# Patient Record
Sex: Male | Born: 1955 | Race: White | Hispanic: No | Smoking: Former smoker
Health system: Southern US, Community
[De-identification: ages and names within clinical notes are randomized; demographics above are authoritative.]

## PROBLEM LIST (undated history)

## (undated) DIAGNOSIS — G4730 Sleep apnea, unspecified: Secondary | ICD-10-CM

## (undated) DIAGNOSIS — Z973 Presence of spectacles and contact lenses: Secondary | ICD-10-CM

## (undated) DIAGNOSIS — I10 Essential (primary) hypertension: Secondary | ICD-10-CM

## (undated) DIAGNOSIS — E785 Hyperlipidemia, unspecified: Secondary | ICD-10-CM

## (undated) DIAGNOSIS — E119 Type 2 diabetes mellitus without complications: Secondary | ICD-10-CM

## (undated) DIAGNOSIS — Z972 Presence of dental prosthetic device (complete) (partial): Secondary | ICD-10-CM

## (undated) DIAGNOSIS — I272 Pulmonary hypertension, unspecified: Secondary | ICD-10-CM

## (undated) DIAGNOSIS — I219 Acute myocardial infarction, unspecified: Secondary | ICD-10-CM

## (undated) DIAGNOSIS — G4700 Insomnia, unspecified: Secondary | ICD-10-CM

## (undated) DIAGNOSIS — E1121 Type 2 diabetes mellitus with diabetic nephropathy: Secondary | ICD-10-CM

## (undated) DIAGNOSIS — M1990 Unspecified osteoarthritis, unspecified site: Secondary | ICD-10-CM

## (undated) DIAGNOSIS — I2510 Atherosclerotic heart disease of native coronary artery without angina pectoris: Secondary | ICD-10-CM

## (undated) DIAGNOSIS — Z9289 Personal history of other medical treatment: Secondary | ICD-10-CM

## (undated) HISTORY — PX: KNEE ARTHROSCOPY: SHX127

## (undated) HISTORY — DX: Atherosclerotic heart disease of native coronary artery without angina pectoris: I25.10

## (undated) HISTORY — PX: COLONOSCOPY: SHX174

## (undated) HISTORY — DX: Essential (primary) hypertension: I10

## (undated) HISTORY — DX: Hyperlipidemia, unspecified: E78.5

## (undated) HISTORY — PX: TONSILLECTOMY: SUR1361

## (undated) HISTORY — DX: Personal history of other medical treatment: Z92.89

## (undated) HISTORY — PX: SHOULDER ARTHROSCOPY: SHX128

## (undated) HISTORY — DX: Insomnia, unspecified: G47.00

## (undated) HISTORY — DX: Acute myocardial infarction, unspecified: I21.9

## (undated) HISTORY — DX: Type 2 diabetes mellitus with diabetic nephropathy: E11.21

---

## 2000-09-13 HISTORY — PX: TOTAL KNEE ARTHROPLASTY: SHX125

## 2000-12-08 ENCOUNTER — Encounter: Payer: Self-pay | Admitting: Orthopedic Surgery

## 2000-12-12 ENCOUNTER — Inpatient Hospital Stay (HOSPITAL_COMMUNITY): Admission: RE | Admit: 2000-12-12 | Discharge: 2000-12-16 | Payer: Self-pay | Admitting: Orthopedic Surgery

## 2009-10-23 ENCOUNTER — Encounter: Payer: Self-pay | Admitting: Cardiology

## 2010-05-11 ENCOUNTER — Encounter: Payer: Self-pay | Admitting: Internal Medicine

## 2010-05-13 ENCOUNTER — Ambulatory Visit: Payer: Self-pay | Admitting: Internal Medicine

## 2010-05-13 ENCOUNTER — Ambulatory Visit (HOSPITAL_COMMUNITY): Admission: RE | Admit: 2010-05-13 | Discharge: 2010-05-13 | Payer: Self-pay | Admitting: Internal Medicine

## 2010-05-13 HISTORY — PX: COLONOSCOPY: SHX174

## 2010-05-19 ENCOUNTER — Encounter: Payer: Self-pay | Admitting: Internal Medicine

## 2010-09-13 HISTORY — PX: SHOULDER ARTHROSCOPY: SHX128

## 2010-10-13 NOTE — Letter (Signed)
Summary: Patient Notice, Colon Biopsy Results  [facility]  [address]   [place], [place] [address]   Phone: [phone]  Fax: [phone]       May 19, 2010   [name] Foster [address] [place], [place]  [address] 12-19-1955    Dear Ms. Edler,  I am pleased to inform you that the biopsies taken during your recent colonoscopy did not show any evidence of cancer upon pathologic examination.  Additional information/recommendations:  You should have a repeat colonoscopy examination  in 7 years.  Please call us if you are having persistent problems or have questions about your condition that have not been fully answered at this time.  Sincerely,    R. Roetta Sessions MD, FACP [facility] Ph: [phone]    Fax: [phone]   Appended Document: Patient Notice, Colon Biopsy Results Letter mailed to pt.

## 2010-10-13 NOTE — Letter (Signed)
Summary: [facility] orders  t[facility] orders   Imported By: Rosine Beat 05/11/2010 08:24:27  _____________________________________________________________________  External Attachment:    Type:   Image     Comment:   External Document

## 2010-11-02 ENCOUNTER — Encounter: Payer: Self-pay | Admitting: Cardiology

## 2010-11-10 ENCOUNTER — Encounter: Payer: Self-pay | Admitting: Cardiology

## 2010-11-16 ENCOUNTER — Encounter: Payer: Self-pay | Admitting: *Deleted

## 2010-11-24 NOTE — Letter (Signed)
Summary: External Correspondence/  [facility]  External Correspondence/  [facility]   Imported By: Dorise Hiss 11/16/2010 14:44:47  _____________________________________________________________________  External Attachment:    Type:   Image     Comment:   External Document

## 2010-11-24 NOTE — Medication Information (Signed)
Summary: RX Folder/ [facility]  RX Folder/ [facility]   Imported By: Dorise Hiss 11/16/2010 14:41:18  _____________________________________________________________________  External Attachment:    Type:   Image     Comment:   External Document

## 2010-11-24 NOTE — Letter (Signed)
Summary: External Correspondence/  [facility]  External Correspondence/  [facility]   Imported By: Dorise Hiss 11/16/2010 14:43:37  _____________________________________________________________________  External Attachment:    Type:   Image     Comment:   External Document

## 2010-12-01 ENCOUNTER — Other Ambulatory Visit: Payer: Self-pay | Admitting: Cardiology

## 2010-12-01 ENCOUNTER — Encounter: Payer: Self-pay | Admitting: *Deleted

## 2010-12-01 ENCOUNTER — Encounter: Payer: Self-pay | Admitting: Cardiology

## 2010-12-01 ENCOUNTER — Ambulatory Visit (INDEPENDENT_AMBULATORY_CARE_PROVIDER_SITE_OTHER): Payer: Private Health Insurance - Indemnity | Admitting: Cardiology

## 2010-12-01 VITALS — BP 153/91 | HR 69 | Ht 68.0 in | Wt 194.0 lb

## 2010-12-01 DIAGNOSIS — R072 Precordial pain: Secondary | ICD-10-CM

## 2010-12-01 DIAGNOSIS — E782 Mixed hyperlipidemia: Secondary | ICD-10-CM | POA: Insufficient documentation

## 2010-12-01 DIAGNOSIS — R0602 Shortness of breath: Secondary | ICD-10-CM

## 2010-12-01 DIAGNOSIS — I152 Hypertension secondary to endocrine disorders: Secondary | ICD-10-CM | POA: Insufficient documentation

## 2010-12-01 DIAGNOSIS — E1169 Type 2 diabetes mellitus with other specified complication: Secondary | ICD-10-CM | POA: Insufficient documentation

## 2010-12-01 DIAGNOSIS — R9431 Abnormal electrocardiogram [ECG] [EKG]: Secondary | ICD-10-CM | POA: Insufficient documentation

## 2010-12-01 DIAGNOSIS — E785 Hyperlipidemia, unspecified: Secondary | ICD-10-CM | POA: Insufficient documentation

## 2010-12-01 DIAGNOSIS — E1159 Type 2 diabetes mellitus with other circulatory complications: Secondary | ICD-10-CM | POA: Insufficient documentation

## 2010-12-01 DIAGNOSIS — I10 Essential (primary) hypertension: Secondary | ICD-10-CM

## 2010-12-01 DIAGNOSIS — G4730 Sleep apnea, unspecified: Secondary | ICD-10-CM | POA: Insufficient documentation

## 2010-12-01 MED ORDER — CHLORTHALIDONE 25 MG PO TABS: 25.0000 mg | ORAL_TABLET | Freq: Every day | ORAL | Status: DC

## 2010-12-01 NOTE — Assessment & Plan Note (Signed)
the patient has hypertriglyceridemia and acid to cut back on his carbohydrate intake

## 2010-12-01 NOTE — Assessment & Plan Note (Signed)
Blood pressure remains poorly controlled.  Will add chlorthalidone 25 mg p.o. Daily.  Further follow-up can be part of patient's primary care physician

## 2010-12-01 NOTE — Assessment & Plan Note (Signed)
The patient has multiple risk factors for coronary artery disease.  His dyspnea on exertion is concerning.  As an abnormal electrocardiogram with T-wave inversions.  He is poorly controlled hypertension.  He will be referred for an exercise Cardiolite stress study.This can be done on medications because the patient is not taking beta-blockers or calcium blockers.

## 2010-12-01 NOTE — Progress Notes (Signed)
HPI The patient is referred from [facility] for new patient evaluation. He has been treated for hypertension.  He currently takes rather brittle and amlodipine.  He has had difficulty sleeping.  According to the patient why he has loud snoring and sometimes stops breathing.  He is in the process of being evaluated for a sleep study.  Based on his history could have obstructive sleep apnea. The patient has dyslipidemia and HDL of 35 and triglycerides of 249.  LDL cholesterol is 97. The patient has been referred because of an abnormal electrocardiogram.  He has some inverted T waves in the lateral precordial leads.  He has multiple risk factors for coronary artery disease including hypertension and dyslipidemia.  His blood pressure remains poorly controlled.  The patient has noticed a decrease in exercise tolerance and increased shortness of breath on exertion.  Has some mild chest pressure on exertion.  He denies any definite chest pain.  He denies any claudication. The remainder of the review of systems is otherwise within normal limits.  The patient denies any palpitations, orthopnea or PND.  No Known Allergies  Current Outpatient Prescriptions on File Prior to Visit  Medication Sig Dispense Refill  . amLODipine (NORVASC) 5 MG tablet Take 1 tablet by mouth daily.       Marland Kitchen HYDROcodone-acetaminophen (VICODIN ES) 7.5-750 MG per tablet Take 1 tablet by mouth every 6 hours as needed pain.       . ramipril (ALTACE) 10 MG tablet Take 1 tablet by mouth daily.       Marland Kitchen zolpidem (AMBIEN) 10 MG tablet Take 1 tablet by mouth every night.       Marland Kitchen DISCONTD: cyclobenzaprine (FLEXERIL) 10 MG tablet Take 1 tablet by mouth four times per day.       Marland Kitchen DISCONTD: rOPINIRole (REQUIP) 1 MG tablet Take 1 mg by mouth 3 (three) times daily.        Marland Kitchen DISCONTD: rosuvastatin (CRESTOR) 5 MG tablet Take 1 tablet by mouth every night.          Past Medical History  Diagnosis Date  . Hypertension   . Insomnia   .  Hyperlipidemia     History reviewed. No pertinent past surgical history.  History reviewed. No pertinent family history.  History   Social History  . Marital Status: Single    Spouse Name: N/A    Number of Children: N/A  . Years of Education: N/A   Occupational History  . Not on file.   Social History Main Topics  . Smoking status: Current Everyday Smoker -- 0.5 packs/day  . Smokeless tobacco: Not on file  . Alcohol Use: Not on file  . Drug Use: Not on file  . Sexually Active: Not on file   Other Topics Concern  . Not on file   Social History Narrative  . No narrative on file   The patient denies fatigue, malaise, fever, weight gain/loss, vision loss, decreased hearing, hoarseness, chest pain, palpitations, shortness of breath, prolonged cough, wheezing, sleep apnea, coughing up blood, abdominal pain, blood in stool, nausea, vomiting, diarrhea, heartburn, incontinence, blood in urine, muscle weakness, joint pain, leg swelling, rash, skin lesions, headache, fainting, dizziness, depression, anxiety, enlarged lymph nodes, easy bruising or bleeding, and environmental allergies.    PHYSICAL EXAM BP 153/91  Pulse 69  Ht 5\' 8"  (1.727 m)  Wt 194 lb (87.998 kg)  BMI 29.50 kg/m2 General: Well-developed, well-nourished in no distress Head: Normocephalic and atraumatic Eyes:PERRLA/EOMI intact, conjunctiva  and lids normal Ears: No deformity or lesions Mouth:normal dentition, normal posterior pharynx Neck: Supple, no JVD.  No masses, thyromegaly or abnormal cervical nodes Lungs: Normal breath sounds bilaterally without wheezing.  Normal percussion Cardiac: regular rate and rhythm with normal S1 and S2, no S3 or S4.  PMI is normal.  No pathological murmurs Abdomen: Normal bowel sounds, abdomen is soft and nontender without masses, organomegaly or hernias noted.  No hepatosplenomegaly MSK: Back normal, normal gait muscle strength and tone normal Vascular: Pulse is normal in all 4  extremities Extremities: No peripheral pitting edema Neurologic: Alert and oriented x 3 Skin: Intact without lesions or rashes Lymphatics: No significant adenopathyPsychologic: Normal affect   EAV:[id number] sinus rhythm nonspecific T wave abnormalities but inverted T waves in the lateral leads.  Heart rate 66 beats/min  ASSESSMENT AND PLAN

## 2010-12-01 NOTE — Patient Instructions (Signed)
Begin Chlorthalidone 25mg  daily Exercise Cardiolite (stress test) - on meds Follow up in  6 months

## 2010-12-01 NOTE — Progress Notes (Signed)
Addended by: Hoover Brunette on: 12/01/2010 12:19 PM   Modules accepted: Orders

## 2010-12-02 ENCOUNTER — Other Ambulatory Visit: Payer: Self-pay | Admitting: Cardiology

## 2010-12-07 DIAGNOSIS — R072 Precordial pain: Secondary | ICD-10-CM

## 2011-01-14 ENCOUNTER — Encounter: Payer: Self-pay | Admitting: Cardiology

## 2011-01-29 NOTE — Op Note (Signed)
[place]  Patient:    [name] Foster, [name] Foster                    MRN: [id number] Proc. Date: 12/12/00 Attending:  Jonny Ruiz L. Dorothyann Gibbs, M.D.                           Operative Report  PREOPERATIVE DIAGNOSIS:  Medial compartment osteoarthritis, left knee.  POSTOPERATIVE DIAGNOSIS:  Medial compartment osteoarthritis, left knee.  OPERATION PERFORMED:  Left LCS total knee replacement.  SURGEON:  John L. Dorothyann Gibbs, M.D.  ASSISTANT:  Jamelle Rushing, P.A.  ANESTHESIA:  General.  INDICATIONS FOR PROCEDURE:  The patient has bone-on-bone medial compartment, left knee with relatively well-preserved patellofemoral and lateral joint.  DESCRIPTION OF PROCEDURE:  Under general anesthesia, the left knee was prepared with Betadine and draped as a sterile field.  A sterile proximal thigh tourniquet was used.  The leg was wrapped out with the Esmarch and tourniquet was used at 350 mmHg.  A midline incision was made.  The patella was everted.  Multiple small vessels were cauterized. The femur was sized as a standard plus.  The proximal tibial resection was carried out.  The PCL was not preserved.  Using the first femoral guide, an intercondylar drill hole was placed with a 10 mm flexion gap.  The gap was balanced by release medially from the tibia.  Following this, the intramedullary guide was used and the distal femoral cut was made with a 10 mm extension gap.  Recessing guide was then used.  A lamina spreader was then used and remnants of the cruciates, menisci and spurs off the back of the femoral condyle were resected.  Once this was completed, the center peg hole was placed in the tibia.  The tibia was sized at a size 4. Trial reduction at a size 4 tibia 10 mm rotating platform and a standard plus femur reveals excellent fit, alignment and stability.  The patella was then osteotomized and three peg holes were placed. At this point bony surfaces were  prepared with pulse irrigation.  Synovectomy was carried out.  Tibial component was then cemented in place.  Once it was hardened, the femoral and patellar components were press-fit with excellent fit, alignment and stability.  A tourniquet was then let down at 51 minutes. Multiple small vessels were cauterized.  The wound was then closed in layers with #1 Tycron, 0 and 2-0 Vicryl and skin clips.  The patient tolerated the procedure well and returned to recovery in good condition. DD:  12/12/00 TD:  12/12/00 Job: [id number]

## 2011-01-29 NOTE — Discharge Summary (Signed)
[place]. [facility]  Patient:    [name] Foster, [name] Foster                     MRN: [id number] Adm. Date:  [id number] Disc. Date: [id number] Attending:  Carolan Shiver Ii Dictator:   Jamelle Rushing, P.A.                           Discharge Summary  ADMISSION DIAGNOSES: 1. End-stage osteoarthritis left knee. 2. Tobacco use.  DISCHARGE DIAGNOSES: 1. Left total knee arthroplasty. 2. Tobacco use. 3. Hematoma left knee. 4. Oral thrush.  HISTORY OF PRESENT ILLNESS:  The patient is a 55 year old white male with a history of ACL tear at the age of 67 in his left knee.  The patient has had a 30-35 year history of left knee pain which has progressively worsened over the last 2 years.  An arthroscopic evaluation in 1987 had some short term improvement.  The patient now has constant sharp throbbing pain over the joint with radiation into the calf.  It worsens with kneeling and improves with rest.  He does have grinding, swelling and night pain.  He is currently having no relief with any medications.  ALLERGIES:  No known drug allergies.  CURRENT MEDICATIONS:  None.  SURGICAL PROCEDURE:  On December 12, 2000, the patient was taken to the OR by Dr. Jonny Ruiz L. Rendall, M.D., assisted by Jamelle Rushing, P.A.-C.  Under general anesthesia a left LCS total knee replacement was performed.  The patient tolerated the procedure well and was transferred to the recovery room in good condition.  CONSULTATIONS:  On December 12, 2000, the following routine consults were requested: 1. Physical therapy. 2. Occupational therapy. 3. Rehabilitation. 4. Care management. 5. Pharmacy for routine dosing of Coumadin for DVT prophylaxis.  HOSPITAL COURSE:  On December 12, 2000, the patient was admitted to [facility] under the care of John L. Rendall, M.D.  The patient underwent a left total knee arthroplasty without any complications.  He tolerated the procedure well and was transferred to  the recovery room in good condition.  The patient then incurred a 4-day postoperative course in which the patient had complaints of significant pain and effusion in his knee on postoperative day #2, so this was aspirated and got significant amount of bloody aspirate from his knee.  The patient had immediate relief of discomfort in his knee and only had a slight effusion on the following day but this was alleviated and improved with just a K pad at that time.  The patient also developed a significant sore throat.  On exam it was significantly erythemic.  He did have some white patches along the bilateral lateral borders.  This was swab sent to the lab.  It was found to have some yeast cells with hypha involved.  A rapid strep was negative so the patient was treated with dose of fluconazole and given Majic mouthwash.  The patient did have some improvements on the following day.  Otherwise the patient progressed nicely with physical therapy.   CPM did range up to 70 degrees on the date of discharge. The patient was able to ambulate to about 200 feet with just supervision.  The patients leg remained neuromotor vascularly intact.  A venous Doppler for posterior popliteal pain on postoperative day #3 was negative for any signs of DVT. This leg remained neuromotor vascularly intact.  He did develop  a significant ecchymoses about the entire knee but no obvious signs of any infection.  It was felt that the patient was ready to be discharged home on postoperative day #4 so arrangements were made for him to do so.  LABORATORY DATA:  Gram stain on December 15, 2000, of his throat showed few squamous epithelial cells, a few yeast with hypha elements present.  A few gram positive cocci in pairs and in chains and a few gram positive rods. CBC on December 15, 2000, WBC is 9.9, hemoglobin 12.1, hematocrit 34.7, platelets 178.  Coagulation studies on December 16, 2000, had an INR of 1.7.  Routine chemistries on  December 14, 2000, sodium 133, potassium 3.7, glucose 150, BUN 5, creatinine 0.9.  Routine urinalysis on admission was normal.  Preoperative chest x-ray showed mild interstitial opacities, nonspecific.  EKG:  Normal sinus rhythm at 68 beats per minute.  DISCHARGE MEDICATIONS: 1. Colace 100 mg p.o. b.i.d. 2. Nicotine patch 21 mg every 24 hours. 3. Celebrex 200 mg p.o. b.i.d. 4. Dulcolax 10 mg p.o. q.d. 5. Percocet 1 or 2 tablets every 4-6 hours p.r.n. pain. 6. Coumadin 7.5 mg p.o. q.d. 7. Cepacol throat lozenges p.r.n. 8. Majic mouthwash 5 mL p.o. p.r.n.  DISCHARGE INSTRUCTIONS:  MEDICATIONS:  The patient is to continue routine home medications. 1. Percocet 5 mg 1 or 2 tablets 4-6 hours for pain if needed. 2. Coumadin 5 mg tablets 1-1/2 tablets to equal 7.5 mg a day unless by changed by Genevieve Norlander Pharmacist.  ACTIVITY:  As tolerated with the use of a walker.  No driving.  DIET:  No restrictions.  WOUND CARE:  Keep wound clean and dry but may shower.  Check daily for infection, any increased redness, swelling, discharge, pain or temperature. Call physician if present.  SPECIAL INSTRUCTIONS:  Home health PT and home health RN by Genevieve Norlander.  First PT draw December 19, 2000.  FOLLOWUP:  Call [phone] for a follow up appointment with Dr. Priscille Kluver in 10 days.  DISCHARGE CONDITION:  Discharge to home in good condition. DD:  12/16/00 TD:  12/16/00 Job: [id number]

## 2011-01-29 NOTE — H&P (Signed)
[place]. [facility]  Patient:    Foster, [name]                 MRN: [id number] Adm. Date:  12/12/00 Attending:  Jonny Ruiz L. Dorothyann Foster, M.D. Dictator:   Arnoldo Morale, P.A.                         History and Physical  DATE OF BIRTH:  1955-11-22  CHIEF COMPLAINT:  Progressively worsening left knee pain for the last two years.  HISTORY OF PRESENT ILLNESS:  This 55 year old white male patient presents to Dr. Priscille Kluver with a history of an ACL rupture at age 35 due to an injury on a tobacco slide.  Since that time he has had a left knee scope approximately 15 years ago and has had intermittent left knee pain.  For the last year and a half to two years he has had progressively worsening knee pain.  At this point the pain is pretty much constant, described as either sharp, stabbing, or throbbing located in the middle of the knee or along the joint line.  It does radiate down to his calf at times.  There is a large amount of stiffness in the knee.  The pain increases with any kneeling activities and decreases with rest.  He has tried Celebrex and cortisone shots in the past with no relief of his pain.  He reports when he bears weight on his leg that it seems to shift laterally at times.  The knee also grinds and "ratchets."  It does swell and he does have a moderate amount of night pain.  He has tried different braces and sleeves over the years to help support the knee and for the last year and a half to two years wearing something like that is very painful.  He does not use any assistive devices.  ALLERGIES:  No known drug allergies.  CURRENT MEDICATIONS:  Multivitamin one tablet p.o. q.d.  PAST MEDICAL HISTORY:  He reports he had asthma as a child, but has been asymptomatic for many, many years.  PAST SURGICAL HISTORY: 1. Left knee arthroscopy approximately 1987. 2. Right knee arthroscopy approximately 1985. 3. Right knee arthroscopy 1986 all by  Dr. Priscille Kluver. 4. Right shoulder arthroscopy by Dr. Jonny Ruiz L. Rendall Jan 25, 2001. 5. Tonsillectomy as a child.  SOCIAL HISTORY:  The patient does currently smoke about a pack and a half of cigarettes a day.  He has a 50 pack year history of cigarette smoking.  He drinks alcohol about once a week.  Does not use any drugs.  He is married and has two children age 44 and 74.  He and his wife live in a one story house with four steps into the main entrance.  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  His medical doctor is Dr. Samuel Jester in [place], [place].  FAMILY HISTORY:  His mother is alive at age 1 with no major medical problems. His father died at age 56 with rheumatoid arthritis, end-stage renal disease on hemodialysis, and diabetes mellitus.  He has two brothers age 84 and 92 who are alive and healthy.  His children are age 89 and 56 without any medical problems.  REVIEW OF SYSTEMS:  He does have an occasional migraine with big spaces in between that are almost as long as a year.  He does have decreased hearing in the left ear.  He complains of year long sinus problems.  He does get dyspneic with ambulation up more than two flights of stairs.  He has dentures along the upper jaw line and does wear reading glasses.  He has otherwise in the right knee also.  He does not have a living will nor power of attorney.  All other systems are negative and noncontributory at this time.  PHYSICAL EXAMINATION  GENERAL:  Well-developed, well-nourished, mildly overweight white male in no acute distress.  Walks with no obvious limp.  Mood and affect are appropriate. He talks easily with examiner.  VITAL SIGNS:  Height 5 feet 8 inches, weight 205 pounds, BMI 30, temperature 98.6 degrees Fahrenheit, pulse 68, respirations 16, blood pressure 152/90.  HEENT:  Normocephalic, atraumatic without frontal or maxillary sinus tenderness to palpation.  Conjunctivae:  Pink.  Sclerae:   Anicteric.  PERRLA. EOMs intact.  Funduscopic examination shows visible red reflexes bilaterally with normal retinal vasculature, optic disk, and cup.  No visible external ear deformities.  Hearing grossly intact.  Tympanic membrane pearly grey bilaterally with good light reflex.  Nose and nasal septum midline.  Nasal mucosa pink and moist without exudates or polyps noted.  Buccal mucosa pink and moist.  Good dentition.  Pharynx without erythema or exudates.  Tongue and uvula midline.  Tongue without vesiculations.  Uvula rises equally with phonation.  NECK:  No visible masses or lesions noted.  Trachea midline.  No palpable lymphadenopathy nor thyromegaly.  Carotids +2 bilaterally without bruits. Full range of motion of the cervical spine and nontender to palpation along the cervical spine.  CARDIOVASCULAR:  Heart rate and rhythm regular.  S1, S2 present without rubs, clicks, or murmurs noted.  RESPIRATORY:  Respirations even and unlabored.  Breath sounds clear to auscultation bilaterally without rales or wheezes noted.  ABDOMEN:  Rounded abdominal contour.  Bowel sounds present x 4 quadrants. Soft, nontender to palpation without hepatosplenomegaly nor CVA tenderness. Femoral pulses +2 bilaterally.  Nontender to palpation along the entire length of the vertebral column.  BREASTS:  Deferred at this time.  GENITOURINARY:  Deferred at this time.  RECTAL:  Deferred at this time.  MUSCULOSKELETAL:  No obvious deformities bilateral upper extremities with full range of motion of these extremities without pain.  Radial pulses are +2 bilaterally.  Full range of motion of his hips, ankles, and toes bilaterally without pain.  DP and PT pulses are +2.  Right knee has no obvious deformity. No effusion.  No pain with palpation along the joint line.  Minimal crepitus with range of motion.  Has full extension with flexion to 130 degrees. Collateral ligaments are stable.  Left knee has no  obvious deformity.  Minimal crepitus with range of motion in the left knee.  He does have pain with  palpation along the lateral joint line of the left knee.  No effusion. Collateral ligaments are stable.  Full extension and flexion only to 115 degrees.  NEUROLOGIC:  Alert and oriented x 3.  Cranial nerves 2-12 are grossly intact. Strength 5/5 bilateral upper and lower extremities.  Rapid alternating movements intact.  Deep tendon reflexes 2+ bilateral upper and lower extremities.  Sensation intact to light touch.  Rapid alternating movements intact.  IMPRESSION: 1. End-stage osteoarthritis left knee with history of osteoarthritis right    knee. 2. Tobacco abuse.  PLAN:  [name] will be admitted to [facility] on December 12, 2000 where he will undergo a left total knee arthroplasty by Dr.  John L. Rendall. He will undergo all the routine preoperative laboratory tests and studies prior to this procedure.  He does wish to try to quit smoking while he is hospitalized so we will put him on a nicotine patch postoperatively. DD:  12/08/00 TD:  12/08/00 Job: [id number]

## 2011-12-04 ENCOUNTER — Other Ambulatory Visit: Payer: Self-pay | Admitting: Cardiology

## 2012-04-06 ENCOUNTER — Other Ambulatory Visit: Payer: Self-pay | Admitting: Cardiology

## 2012-06-06 ENCOUNTER — Other Ambulatory Visit: Payer: Self-pay | Admitting: Cardiology

## 2012-07-30 ENCOUNTER — Ambulatory Visit: Payer: Private Health Insurance - Indemnity | Attending: Neurology | Admitting: Sleep Medicine

## 2012-07-30 DIAGNOSIS — G4730 Sleep apnea, unspecified: Secondary | ICD-10-CM

## 2012-08-05 NOTE — Procedures (Signed)
[facility] Bradee Common A. Gerilyn Pilgrim, MD     [email or web address]        NAME:  [name] Foster, [name] Foster              ACCOUNT NO.:  [id number]  MEDICAL RECORD NO.:  [id number]          PATIENT TYPE:  OUT  LOCATION:  SLEEP LAB                     FACILITY:  [facility]  PHYSICIAN:  Adiel Erney A. Gerilyn Pilgrim, M.D. DATE OF BIRTH:  05-10-1956  DATE OF STUDY:  07/30/2012                           NOCTURNAL POLYSOMNOGRAM  REFERRING PHYSICIAN:  Samuel Jester, MD  INDICATION FOR STUDY:  A 56 year old, who presents with hypersomnia, fatigue, and snoring.  MEDICATIONS:  Ambien, Norvasc, chlorthalidone, glipizide, hydrocodone, Altace.  EPWORTH SLEEPINESS SCORE:  8.  BMI 29.  ARCHITECTURAL SUMMARY:  The total recording time is 360 minutes.  Sleep efficiency is 80%, sleep latency 32 minutes, REM latency 133 minutes. Stage N1 of 4%, N2 of 72%, N3 of 7%, and REM sleep 17%.  RESPIRATORY SUMMARY:  Baseline oxygen saturation 94, lowest saturation 83, diagnostic AHI 15.  LIMB MOVEMENT SUMMARY:  PLM index 36.  ELECTROCARDIOGRAM SUMMARY:  Average heart rate 76 with no significant dysrhythmias observed.  IMPRESSION:  Moderate obstructive sleep apnea syndrome.  Moderate to severe periodic limb movement disorder sleep.  Recommendation positive titration treatment.      Novaleigh Kohlman A. Gerilyn Pilgrim, M.D.    KAD/MEDQ  D:  08/05/2012 14:09:35  T:  08/05/2012 14:35:21  Job:  [id number]

## 2012-12-20 ENCOUNTER — Other Ambulatory Visit (HOSPITAL_COMMUNITY): Payer: Self-pay

## 2012-12-20 DIAGNOSIS — G4730 Sleep apnea, unspecified: Secondary | ICD-10-CM

## 2012-12-20 DIAGNOSIS — G4700 Insomnia, unspecified: Secondary | ICD-10-CM

## 2012-12-24 ENCOUNTER — Ambulatory Visit: Payer: Private Health Insurance - Indemnity | Attending: Neurology | Admitting: Sleep Medicine

## 2012-12-24 DIAGNOSIS — G4733 Obstructive sleep apnea (adult) (pediatric): Secondary | ICD-10-CM | POA: Insufficient documentation

## 2012-12-24 DIAGNOSIS — G4710 Hypersomnia, unspecified: Secondary | ICD-10-CM

## 2012-12-25 NOTE — Patient Instructions (Signed)
CPAP and BIPAP  CPAP and BIPAP are methods of helping you breathe. CPAP stands for "continuous positive airway pressure." BIPAP stands for "bi-level positive airway pressure." Both CPAP and BIPAP are provided by a small machine with a flexible plastic tube that attaches to a plastic mask that goes over your nose or mouth. Air is blown into your air passages through your nose or mouth. This helps to keep your airways open and helps to keep you breathing well.  The amount of pressure that is used to blow the air into your air passages can be set on the machine. The pressure setting is based on your needs. With CPAP, the amount of pressure stays the same while you breathe in and out. With BIPAP, the amount of pressure changes when you inhale and exhale. Your caregiver will recommend whether CPAP or BIPAP would be more helpful for you.    CPAP and BIPAP can be helpful for both adults and children with:   Sleep apnea.   Chronic Obstructive Pulmonary Disease (COPD), a condition like emphysema.   Diseases which weaken the muscles of the chest such as muscular dystrophy or neurological diseases.   Other problems that cause breathing to be weak or difficult.  USE OF CPAP OR BIPAP  The respiratory therapist or technician will help you get used to wearing the mask. Some people feel claustrophobic (a trapped or closed in feeling) at first, because the mask needs to be fairly snug on your face.     It may help you to get used to the mask gradually, by first holding the mask loosely over your nose or mouth using a low pressure setting on the machine. Gradually the mask can be applied more snugly with increased pressure. You can also gradually increase the amount of time the mask is used.   People with sleep apnea will use the mask and machine at night when they are sleeping. Others, like those with ALS or other breathing difficulties, may need the CPAP or BIPAP all the time.    If the first mask you try does not fit well, or is uncomfortable, there are other types and sizes that can be tried.   If you tend to breathe through your mouth, a chin strap may be applied to help keep your mouth closed (if you are using a nasal mask).   The CPAP and BIPAP machines have alarms that may sound if the mask comes off or develops a leak.   You should not eat or drink while the CPAP or BIPAP is on. Food or fluids could get pushed into your lungs by the pressure of the CPAP or BIPAP.  Sometimes CPAP or BIPAP machines are ordered for home use. If you are going to use the CPAP or BIPAP machine at home, follow these instructions   CPAP or BIPAP machines can be rented or purchased through home health care companies. There are many different brands of machines available. If you rent a machine before purchasing you may find which particular machine works well for you.   Ask questions if there is something you do not understand when picking out your machine.   Place your CPAP or BIPAP machine on a secure table or stand near an electrical outlet.   Know where the On/Off switch is.   Follow your doctor's instructions for how to set the pressure on your machine and when you should use it.   Do not smoke! Tobacco smoke residue can damage the   machine.  SEEK IMMEDIATE MEDICAL CARE IF:     You have redness or open areas around your nose or mouth.   You have trouble operating the CPAP or BIPAP machine.   You cannot tolerate wearing the CPAP or BIPAP mask.   You have any questions or concerns.  Document Released: 05/28/2004 Document Revised: 11/22/2011 Document Reviewed: 08/27/2008  [employer] Patient Information 2013 [employer].

## 2012-12-30 NOTE — Procedures (Signed)
[facility] Talis Iwan A. Gerilyn Pilgrim, MD     [email or web address]        NAME:  [name] Foster, [name] Foster              ACCOUNT NO.:  [id number]  MEDICAL RECORD NO.:  [id number]          PATIENT TYPE:  OUT  LOCATION:  SLEEP LAB                     FACILITY:  [facility]  PHYSICIAN:  Kashmir Lysaght A. Gerilyn Pilgrim, M.D. DATE OF BIRTH:  1956-04-18  DATE OF STUDY:  12/24/2012                           NOCTURNAL POLYSOMNOGRAM  REFERRING PHYSICIAN:  Nadene Witherspoon A. Gerilyn Pilgrim, M.D.  INDICATIONS:  This a 57 year old, who has history of obstructive sleep apnea syndrome documented by previous study.  This is a CPAP titration recording.   MEDICATIONS:  Chlorthalidone, Altace, Vicodin, Norvasc, glipizide, Crestor, Cymbalta.  EPWORTH SLEEPINESS SCALE:  8.  BMI:  29.  ARCHITECTURAL SUMMARY:  This is a full night CPAP titration recording. Total recording time is 431 minutes.  Sleep efficiency 83%.  Sleep latency 26 minutes.  REM latency 70 minutes.  Stage N1 is 11%, N2 is 69%, N3 is 5%, and REM sleep 13%.  RESPIRATORY SUMMARY:  Baseline oxygen saturation is 94, lowest saturation 88 during non-REM sleep.  The patient was started on CPAP of 5 and increased to 14, did well anywhere from 12 and above.  Higher pressures Center causes more central events.  He tolerated the pressures well; however.  LIMB MOVEMENT SUMMARY:  PLM index 0.  ELECTROCARDIOGRAM SUMMARY:  Average heart rate is 69 with PVCs observed.  IMPRESSION:  Obstructive sleep apnea syndrome, which responds well to CPAP of 12.  Recommended CPAP level is therefore 12.     Ralston Venus A. Gerilyn Pilgrim, M.D.    KAD/MEDQ  D:  12/30/2012 13:47:11  T:  12/30/2012 14:05:36  Job:  [id number]

## 2014-07-12 ENCOUNTER — Other Ambulatory Visit: Payer: Self-pay | Admitting: Orthopedic Surgery

## 2014-07-23 ENCOUNTER — Encounter (HOSPITAL_BASED_OUTPATIENT_CLINIC_OR_DEPARTMENT_OTHER)
Admission: RE | Admit: 2014-07-23 | Discharge: 2014-07-23 | Disposition: A | Discharge status: Home / Self Care | Payer: 59 | Source: Ambulatory Visit | Attending: Orthopedic Surgery | Admitting: Orthopedic Surgery

## 2014-07-23 ENCOUNTER — Encounter (HOSPITAL_BASED_OUTPATIENT_CLINIC_OR_DEPARTMENT_OTHER): Payer: Self-pay | Admitting: *Deleted

## 2014-07-23 DIAGNOSIS — I10 Essential (primary) hypertension: Secondary | ICD-10-CM | POA: Diagnosis not present

## 2014-07-23 DIAGNOSIS — F17210 Nicotine dependence, cigarettes, uncomplicated: Secondary | ICD-10-CM | POA: Diagnosis not present

## 2014-07-23 DIAGNOSIS — M67432 Ganglion, left wrist: Secondary | ICD-10-CM | POA: Diagnosis present

## 2014-07-23 DIAGNOSIS — G4700 Insomnia, unspecified: Secondary | ICD-10-CM | POA: Diagnosis not present

## 2014-07-23 DIAGNOSIS — E785 Hyperlipidemia, unspecified: Secondary | ICD-10-CM | POA: Diagnosis not present

## 2014-07-23 DIAGNOSIS — M65832 Other synovitis and tenosynovitis, left forearm: Secondary | ICD-10-CM | POA: Diagnosis not present

## 2014-07-23 LAB — BASIC METABOLIC PANEL
ANION GAP: 16 — AB (ref 5–15)
BUN: 13 mg/dL (ref 6–23)
CHLORIDE: 97 meq/L (ref 96–112)
CO2: 25 mEq/L (ref 19–32)
Calcium: 9.2 mg/dL (ref 8.4–10.5)
Creatinine, Ser: 0.92 mg/dL (ref 0.50–1.35)
GFR calc non Af Amer: >90 mL/min (ref >90)
Glucose, Bld: 99 mg/dL (ref 70–99)
POTASSIUM: 4.1 meq/L (ref 3.7–5.3)
Sodium: 138 mEq/L (ref 137–147)

## 2014-07-23 NOTE — Progress Notes (Signed)
EKG reviewed by Dr Orene Desanctis. [place] for surgery.

## 2014-07-23 NOTE — Progress Notes (Signed)
To come in for bmet-ekg Has sleep apnea, could not use a cpap-has not had post op problems

## 2014-07-25 ENCOUNTER — Ambulatory Visit (HOSPITAL_BASED_OUTPATIENT_CLINIC_OR_DEPARTMENT_OTHER): Payer: 59 | Admitting: Certified Registered"

## 2014-07-25 ENCOUNTER — Ambulatory Visit (HOSPITAL_BASED_OUTPATIENT_CLINIC_OR_DEPARTMENT_OTHER)
Admission: RE | Admit: 2014-07-25 | Discharge: 2014-07-26 | Disposition: A | Discharge status: Home / Self Care | Payer: 59 | Source: Ambulatory Visit | Attending: Orthopedic Surgery | Admitting: Orthopedic Surgery

## 2014-07-25 ENCOUNTER — Encounter (HOSPITAL_BASED_OUTPATIENT_CLINIC_OR_DEPARTMENT_OTHER): Payer: Self-pay | Admitting: Certified Registered"

## 2014-07-25 ENCOUNTER — Encounter (HOSPITAL_BASED_OUTPATIENT_CLINIC_OR_DEPARTMENT_OTHER)
Admission: RE | Disposition: A | Discharge status: Home / Self Care | Payer: Self-pay | Source: Ambulatory Visit | Attending: Orthopedic Surgery

## 2014-07-25 DIAGNOSIS — E785 Hyperlipidemia, unspecified: Secondary | ICD-10-CM | POA: Insufficient documentation

## 2014-07-25 DIAGNOSIS — M67439 Ganglion, unspecified wrist: Secondary | ICD-10-CM | POA: Diagnosis present

## 2014-07-25 DIAGNOSIS — I10 Essential (primary) hypertension: Secondary | ICD-10-CM | POA: Insufficient documentation

## 2014-07-25 DIAGNOSIS — G4700 Insomnia, unspecified: Secondary | ICD-10-CM | POA: Insufficient documentation

## 2014-07-25 DIAGNOSIS — M67432 Ganglion, left wrist: Secondary | ICD-10-CM | POA: Diagnosis not present

## 2014-07-25 DIAGNOSIS — M65832 Other synovitis and tenosynovitis, left forearm: Secondary | ICD-10-CM | POA: Insufficient documentation

## 2014-07-25 DIAGNOSIS — F17210 Nicotine dependence, cigarettes, uncomplicated: Secondary | ICD-10-CM | POA: Insufficient documentation

## 2014-07-25 HISTORY — DX: Presence of spectacles and contact lenses: Z97.3

## 2014-07-25 HISTORY — DX: Presence of dental prosthetic device (complete) (partial): Z97.2

## 2014-07-25 HISTORY — DX: Sleep apnea, unspecified: G47.30

## 2014-07-25 HISTORY — PX: GANGLION CYST EXCISION: SHX1691

## 2014-07-25 LAB — GLUCOSE, CAPILLARY: GLUCOSE-CAPILLARY: 175 mg/dL — AB (ref 70–99)

## 2014-07-25 LAB — POCT HEMOGLOBIN-HEMACUE: Hemoglobin: 18.3 g/dL — ABNORMAL HIGH (ref 13.0–17.0)

## 2014-07-25 SURGERY — EXCISION, GANGLION CYST, WRIST
Anesthesia: General | Site: Wrist | Laterality: Left

## 2014-07-25 MED ORDER — GLIPIZIDE 5 MG PO TABS
5.0000 mg | ORAL_TABLET | Freq: Every day | ORAL | Status: DC
  Administered 2014-07-25 – 2014-07-26 (×2): 5 mg via ORAL
  Filled 2014-07-25 (×2): qty 1

## 2014-07-25 MED ORDER — MIDAZOLAM HCL 2 MG/2ML IJ SOLN: 1.0000 mg | INTRAMUSCULAR | Status: DC | PRN

## 2014-07-25 MED ORDER — ONDANSETRON HCL 4 MG PO TABS: 4.0000 mg | ORAL_TABLET | Freq: Four times a day (QID) | ORAL | Status: DC | PRN

## 2014-07-25 MED ORDER — OXYCODONE HCL 5 MG PO TABS: 5.0000 mg | ORAL_TABLET | Freq: Once | ORAL | Status: AC | PRN

## 2014-07-25 MED ORDER — BUPIVACAINE HCL (PF) 0.25 % IJ SOLN
INTRAMUSCULAR | Status: DC | PRN
  Administered 2014-07-25: 8 mL

## 2014-07-25 MED ORDER — ROSUVASTATIN CALCIUM 10 MG PO TABS
10.0000 mg | ORAL_TABLET | Freq: Every day | ORAL | Status: DC
  Administered 2014-07-25: 10 mg via ORAL
  Filled 2014-07-25: qty 1

## 2014-07-25 MED ORDER — CEFAZOLIN SODIUM-DEXTROSE 2-3 GM-% IV SOLR
INTRAVENOUS | Status: AC
  Filled 2014-07-25: qty 50

## 2014-07-25 MED ORDER — FENTANYL CITRATE 0.05 MG/ML IJ SOLN
INTRAMUSCULAR | Status: DC | PRN
  Administered 2014-07-25: 50 ug via INTRAVENOUS
  Administered 2014-07-25: 25 ug via INTRAVENOUS
  Administered 2014-07-25: 50 ug via INTRAVENOUS

## 2014-07-25 MED ORDER — MIDAZOLAM HCL 2 MG/2ML IJ SOLN
INTRAMUSCULAR | Status: AC
  Filled 2014-07-25: qty 2

## 2014-07-25 MED ORDER — METOCLOPRAMIDE HCL 5 MG/ML IJ SOLN: 5.0000 mg | Freq: Three times a day (TID) | INTRAMUSCULAR | Status: DC | PRN

## 2014-07-25 MED ORDER — METOCLOPRAMIDE HCL 5 MG PO TABS: 5.0000 mg | ORAL_TABLET | Freq: Three times a day (TID) | ORAL | Status: DC | PRN

## 2014-07-25 MED ORDER — BUPIVACAINE HCL (PF) 0.25 % IJ SOLN
INTRAMUSCULAR | Status: AC
  Filled 2014-07-25: qty 30

## 2014-07-25 MED ORDER — HYDROCODONE-ACETAMINOPHEN 5-325 MG PO TABS: ORAL_TABLET | ORAL | Status: DC

## 2014-07-25 MED ORDER — DEXAMETHASONE SODIUM PHOSPHATE 10 MG/ML IJ SOLN
INTRAMUSCULAR | Status: DC | PRN
  Administered 2014-07-25: 10 mg via INTRAVENOUS

## 2014-07-25 MED ORDER — METHOCARBAMOL 500 MG PO TABS
500.0000 mg | ORAL_TABLET | Freq: Four times a day (QID) | ORAL | Status: DC | PRN
  Administered 2014-07-25: 500 mg via ORAL
  Filled 2014-07-25: qty 1

## 2014-07-25 MED ORDER — HYDROCODONE-ACETAMINOPHEN 5-325 MG PO TABS
1.0000 | ORAL_TABLET | ORAL | Status: DC | PRN
  Administered 2014-07-25 – 2014-07-26 (×5): 2 via ORAL
  Filled 2014-07-25 (×5): qty 2

## 2014-07-25 MED ORDER — FENTANYL CITRATE 0.05 MG/ML IJ SOLN: 50.0000 ug | INTRAMUSCULAR | Status: DC | PRN

## 2014-07-25 MED ORDER — FENTANYL CITRATE 0.05 MG/ML IJ SOLN
INTRAMUSCULAR | Status: AC
  Filled 2014-07-25: qty 6

## 2014-07-25 MED ORDER — NICOTINE 21 MG/24HR TD PT24
21.0000 mg | MEDICATED_PATCH | Freq: Every day | TRANSDERMAL | Status: DC
  Administered 2014-07-25: 21 mg via TRANSDERMAL
  Filled 2014-07-25: qty 1

## 2014-07-25 MED ORDER — ONDANSETRON HCL 4 MG/2ML IJ SOLN: 4.0000 mg | Freq: Once | INTRAMUSCULAR | Status: AC | PRN

## 2014-07-25 MED ORDER — OXYCODONE HCL 5 MG/5ML PO SOLN: 5.0000 mg | Freq: Once | ORAL | Status: AC | PRN

## 2014-07-25 MED ORDER — PROPOFOL 10 MG/ML IV EMUL
INTRAVENOUS | Status: AC
  Filled 2014-07-25: qty 50

## 2014-07-25 MED ORDER — MORPHINE SULFATE 2 MG/ML IJ SOLN: 1.0000 mg | INTRAMUSCULAR | Status: DC | PRN

## 2014-07-25 MED ORDER — LACTATED RINGERS IV SOLN
INTRAVENOUS | Status: DC
  Administered 2014-07-25: 08:00:00 via INTRAVENOUS
  Administered 2014-07-25: 10 mL/h via INTRAVENOUS
  Administered 2014-07-25: 09:00:00 via INTRAVENOUS

## 2014-07-25 MED ORDER — MIDAZOLAM HCL 5 MG/5ML IJ SOLN
INTRAMUSCULAR | Status: DC | PRN
  Administered 2014-07-25: 2 mg via INTRAVENOUS

## 2014-07-25 MED ORDER — AMLODIPINE BESYLATE 5 MG PO TABS
5.0000 mg | ORAL_TABLET | Freq: Every day | ORAL | Status: DC
  Administered 2014-07-25: 5 mg via ORAL

## 2014-07-25 MED ORDER — ONDANSETRON HCL 4 MG/2ML IJ SOLN: 4.0000 mg | Freq: Four times a day (QID) | INTRAMUSCULAR | Status: DC | PRN

## 2014-07-25 MED ORDER — METHOCARBAMOL 1000 MG/10ML IJ SOLN: 500.0000 mg | Freq: Four times a day (QID) | INTRAVENOUS | Status: DC | PRN

## 2014-07-25 MED ORDER — ZOLPIDEM TARTRATE 10 MG PO TABS: 10.0000 mg | ORAL_TABLET | Freq: Every evening | ORAL | Status: DC | PRN

## 2014-07-25 MED ORDER — CEFAZOLIN SODIUM-DEXTROSE 2-3 GM-% IV SOLR
2.0000 g | INTRAVENOUS | Status: AC
  Administered 2014-07-25: 2 g via INTRAVENOUS

## 2014-07-25 MED ORDER — LIDOCAINE HCL (CARDIAC) 20 MG/ML IV SOLN
INTRAVENOUS | Status: DC | PRN
  Administered 2014-07-25: 60 mg via INTRAVENOUS

## 2014-07-25 MED ORDER — ONDANSETRON HCL 4 MG/2ML IJ SOLN
INTRAMUSCULAR | Status: DC | PRN
  Administered 2014-07-25: 4 mg via INTRAVENOUS

## 2014-07-25 MED ORDER — CHLORTHALIDONE 25 MG PO TABS
25.0000 mg | ORAL_TABLET | Freq: Every day | ORAL | Status: DC
  Administered 2014-07-25: 25 mg via ORAL

## 2014-07-25 MED ORDER — EPHEDRINE SULFATE 50 MG/ML IJ SOLN
INTRAMUSCULAR | Status: DC | PRN
  Administered 2014-07-25 (×2): 10 mg via INTRAVENOUS

## 2014-07-25 MED ORDER — RAMIPRIL 10 MG PO TABS
10.0000 mg | ORAL_TABLET | Freq: Every day | ORAL | Status: DC
  Administered 2014-07-25: 10 mg via ORAL

## 2014-07-25 MED ORDER — PROPOFOL 10 MG/ML IV BOLUS
INTRAVENOUS | Status: DC | PRN
  Administered 2014-07-25: 200 mg via INTRAVENOUS

## 2014-07-25 MED ORDER — HYDROMORPHONE HCL 1 MG/ML IJ SOLN: 0.2500 mg | INTRAMUSCULAR | Status: DC | PRN

## 2014-07-25 MED ORDER — CHLORHEXIDINE GLUCONATE 4 % EX LIQD: 60.0000 mL | Freq: Once | CUTANEOUS | Status: DC

## 2014-07-25 SURGICAL SUPPLY — 48 items
BANDAGE ELASTIC 3 VELCRO ST LF (GAUZE/BANDAGES/DRESSINGS) ×3 IMPLANT
BENZOIN TINCTURE PRP APPL 2/3 (GAUZE/BANDAGES/DRESSINGS) ×3 IMPLANT
BLADE MINI RND TIP GREEN BEAV (BLADE) IMPLANT
BLADE SURG 15 STRL LF DISP TIS (BLADE) ×2 IMPLANT
BLADE SURG 15 STRL SS (BLADE) ×4
BNDG ELASTIC 2 VLCR STRL LF (GAUZE/BANDAGES/DRESSINGS) IMPLANT
BNDG ESMARK 4X9 LF (GAUZE/BANDAGES/DRESSINGS) ×3 IMPLANT
BNDG GAUZE ELAST 4 BULKY (GAUZE/BANDAGES/DRESSINGS) ×3 IMPLANT
CHLORAPREP W/TINT 26ML (MISCELLANEOUS) ×3 IMPLANT
CLOSURE WOUND 1/2 X4 (GAUZE/BANDAGES/DRESSINGS) ×1
CORDS BIPOLAR (ELECTRODE) ×3 IMPLANT
COVER BACK TABLE 60X90IN (DRAPES) ×3 IMPLANT
COVER MAYO STAND STRL (DRAPES) ×3 IMPLANT
CUFF TOURNIQUET SINGLE 18IN (TOURNIQUET CUFF) ×3 IMPLANT
DRAPE EXTREMITY T 121X128X90 (DRAPE) ×3 IMPLANT
DRAPE SURG 17X23 STRL (DRAPES) ×3 IMPLANT
DRSG PAD ABDOMINAL 8X10 ST (GAUZE/BANDAGES/DRESSINGS) IMPLANT
GAUZE SPONGE 4X4 12PLY STRL (GAUZE/BANDAGES/DRESSINGS) ×3 IMPLANT
GAUZE XEROFORM 1X8 LF (GAUZE/BANDAGES/DRESSINGS) ×3 IMPLANT
GLOVE BIO SURGEON STRL SZ7.5 (GLOVE) ×3 IMPLANT
GLOVE BIOGEL PI IND STRL 6.5 (GLOVE) ×1 IMPLANT
GLOVE BIOGEL PI IND STRL 8 (GLOVE) ×1 IMPLANT
GLOVE BIOGEL PI INDICATOR 6.5 (GLOVE) ×2
GLOVE BIOGEL PI INDICATOR 8 (GLOVE) ×2
GLOVE ECLIPSE 6.5 STRL STRAW (GLOVE) ×3 IMPLANT
GLOVE EXAM NITRILE EXT CUFF MD (GLOVE) ×3 IMPLANT
GOWN STRL REUS W/ TWL LRG LVL3 (GOWN DISPOSABLE) ×1 IMPLANT
GOWN STRL REUS W/TWL LRG LVL3 (GOWN DISPOSABLE) ×2
GOWN STRL REUS W/TWL XL LVL3 (GOWN DISPOSABLE) ×3 IMPLANT
NEEDLE HYPO 25X1 1.5 SAFETY (NEEDLE) ×3 IMPLANT
NS IRRIG 1000ML POUR BTL (IV SOLUTION) ×3 IMPLANT
PACK BASIN DAY SURGERY FS (CUSTOM PROCEDURE TRAY) ×3 IMPLANT
PAD CAST 3X4 CTTN HI CHSV (CAST SUPPLIES) ×1 IMPLANT
PADDING CAST ABS 4INX4YD NS (CAST SUPPLIES)
PADDING CAST ABS COTTON 4X4 ST (CAST SUPPLIES) IMPLANT
PADDING CAST COTTON 3X4 STRL (CAST SUPPLIES) ×2
SPLINT PLASTER CAST XFAST 3X15 (CAST SUPPLIES) ×10 IMPLANT
SPLINT PLASTER XTRA FASTSET 3X (CAST SUPPLIES) ×20
STOCKINETTE 4X48 STRL (DRAPES) ×3 IMPLANT
STRIP CLOSURE SKIN 1/2X4 (GAUZE/BANDAGES/DRESSINGS) ×2 IMPLANT
SUT ETHILON 4 0 PS 2 18 (SUTURE) ×3 IMPLANT
SUT MNCRL AB 4-0 PS2 18 (SUTURE) ×3 IMPLANT
SUT MON AB 5-0 PS2 18 (SUTURE) IMPLANT
SUT VIC AB 4-0 P2 18 (SUTURE) ×3 IMPLANT
SYR BULB 3OZ (MISCELLANEOUS) ×3 IMPLANT
SYR CONTROL 10ML LL (SYRINGE) ×3 IMPLANT
TOWEL OR 17X24 6PK STRL BLUE (TOWEL DISPOSABLE) ×3 IMPLANT
UNDERPAD 30X30 INCONTINENT (UNDERPADS AND DIAPERS) ×3 IMPLANT

## 2014-07-25 NOTE — Anesthesia Preprocedure Evaluation (Addendum)
Anesthesia Evaluation  Patient identified by MRN, date of birth, ID band Patient awake    Reviewed: Allergy & Precautions, H&P , NPO status   Airway Mallampati: I  TM Distance: >3 FB Neck ROM: Full    Dental  (+) Teeth Intact, Dental Advisory Given   Pulmonary sleep apnea , Current Smoker,  breath sounds clear to auscultation        Cardiovascular hypertension, Pt. on medications Rhythm:Regular Rate:Normal     Neuro/Psych    GI/Hepatic   Endo/Other  diabetes, Well Controlled, Type 2, Oral Hypoglycemic Agents  Renal/GU      Musculoskeletal   Abdominal   Peds  Hematology   Anesthesia Other Findings Pt is unable to tolerate CPAP. Will plan to admit to [place] overnight for monitoring  Reproductive/Obstetrics                            Anesthesia Physical Anesthesia Plan  ASA: III  Anesthesia Plan: General   Post-op Pain Management:    Induction: Intravenous  Airway Management Planned: LMA  Additional Equipment:   Intra-op Plan:   Post-operative Plan: Extubation in OR  Informed Consent: I have reviewed the patients History and Physical, chart, labs and discussed the procedure including the risks, benefits and alternatives for the proposed anesthesia with the patient or authorized representative who has indicated his/her understanding and acceptance.   Dental advisory given  Plan Discussed with: CRNA, Anesthesiologist and Surgeon  Anesthesia Plan Comments:         Anesthesia Quick Evaluation

## 2014-07-25 NOTE — Transfer of Care (Signed)
Immediate Anesthesia Transfer of Care Note  Patient: [name] Foster  Procedure(s) Performed: Procedure(s): EXCISION VOLAR AND DORSAL GANGLION LEFT WRIST (Left)  Patient Location: PACU  Anesthesia Type:General  Level of Consciousness: awake and patient cooperative  Airway & Oxygen Therapy: Patient Spontanous Breathing and Patient connected to face mask oxygen  Post-op Assessment: Report given to PACU RN and Post -op Vital signs reviewed and stable  Post vital signs: Reviewed and stable  Complications: No apparent anesthesia complications

## 2014-07-25 NOTE — H&P (Signed)
[name] Foster is an 58 y.o. male.   Chief Complaint: left wrist volar and dorsal cysts HPI: 58 yo rhd male with 5 year history of cysts on volar and dorsal aspects of left wrist.  They are bothersome to him and he wishes to have them removed.    Past Medical History  Diagnosis Date  . Hypertension   . Insomnia   . Hyperlipidemia   . Sleep apnea     could not use a cpap  . Wears dentures     full top-partial bottom  . Wears glasses     Past Surgical History  Procedure Laterality Date  . Tonsillectomy    . Colonoscopy    . Total knee arthroplasty  2002    left  . Knee arthroscopy      rifgr and left  . Shoulder arthroscopy  2012    left  . Shoulder arthroscopy      right    History reviewed. No pertinent family history. Social History:  reports that he has been smoking.  He does not have any smokeless tobacco history on file. He reports that he drinks alcohol. He reports that he does not use illicit drugs.  Allergies: No Known Allergies  Medications Prior to Admission  Medication Sig Dispense Refill  . amLODipine (NORVASC) 5 MG tablet Take 1 tablet by mouth daily.     . chlorthalidone (HYGROTON) 25 MG tablet TAKE 1 TABLET (25 MG TOTAL) BY MOUTH DAILY. 15 tablet 0  . glipiZIDE (GLUCOTROL) 5 MG tablet Take 5 mg by mouth daily before breakfast.    . HYDROcodone-acetaminophen (NORCO/VICODIN) 5-325 MG per tablet Take 1 tablet by mouth every 6 (six) hours as needed for moderate pain.    . ramipril (ALTACE) 10 MG tablet Take 1 tablet by mouth daily.     . rosuvastatin (CRESTOR) 10 MG tablet Take 10 mg by mouth daily.      Marland Kitchen zolpidem (AMBIEN) 10 MG tablet Take 1 tablet by mouth every night.       Results for orders placed or performed during the hospital encounter of 07/25/14 (from the past 48 hour(s))  Basic metabolic panel     Status: Abnormal   Collection Time: 07/23/14 11:20 AM  Result Value Ref Range   Sodium 138 137 - 147 mEq/L   Potassium 4.1 3.7 - 5.3 mEq/L    Chloride 97 96 - 112 mEq/L   CO2 25 19 - 32 mEq/L   Glucose, Bld 99 70 - 99 mg/dL   BUN 13 6 - 23 mg/dL   Creatinine, Ser 0.92 0.50 - 1.35 mg/dL   Calcium 9.2 8.4 - 10.5 mg/dL   GFR calc non Af Amer >90 >90 mL/min   GFR calc Af Amer >90 >90 mL/min    Comment: (NOTE) The eGFR has been calculated using the CKD EPI equation. This calculation has not been validated in all clinical situations. eGFR's persistently <90 mL/min signify possible Chronic Kidney Disease.    Anion gap 16 (H) 5 - 15  Glucose, capillary     Status: Abnormal   Collection Time: 07/25/14  8:03 AM  Result Value Ref Range   Glucose-Capillary 175 (H) 70 - 99 mg/dL  Hemoglobin-hemacue, POC     Status: Abnormal   Collection Time: 07/25/14  8:04 AM  Result Value Ref Range   Hemoglobin 18.3 (H) 13.0 - 17.0 g/dL    No results found.   A comprehensive review of systems was negative except for: Eyes:  positive for contacts/glasses  Blood pressure 161/88, pulse 60, temperature 97.6 F (36.4 C), temperature source Oral, resp. rate 20, height '5\' 8"'  (1.727 m), weight 87.091 kg (192 lb), SpO2 99 %.  General appearance: alert, cooperative and appears stated age Head: Normocephalic, without obvious abnormality, atraumatic Neck: supple, symmetrical, trachea midline Resp: clear to auscultation bilaterally Cardio: regular rate and rhythm GI: non tender Extremities: intact sensation and capillary refill all digits.  +epl/fpl/io.  cyst volar and dorsal left wrist.  no skin changes. Pulses: 2+ and symmetric Skin: Skin color, texture, turgor normal. No rashes or lesions Neurologic: Grossly normal Incision/Wound: none  Assessment/Plan Left wrist volar and dorsal ganglion cysts.  Non operative and operative treatment options were discussed with the patient and patient wishes to proceed with operative treatment. Risks, benefits, and alternatives of surgery were discussed and the patient agrees with the plan of  care.   Issacc Foster R 07/25/2014, 8:29 AM

## 2014-07-25 NOTE — Brief Op Note (Signed)
07/25/2014  9:51 AM  PATIENT:  [name]  58 y.o. male  PRE-OPERATIVE DIAGNOSIS:  left wrist volar and dorsal ganglion  POST-OPERATIVE DIAGNOSIS:  left wrist volar ganglion and dorsal extensor tenosynovitis  PROCEDURE:  Left wrist excision volar ganglion and excision extensor tenosynovitis  SURGEON:  Surgeon(s) and Role:    * Leanora Cover, MD - Primary  PHYSICIAN ASSISTANT:   ASSISTANTS: none   ANESTHESIA:   general  EBL:  Total I/O In: 1500 [I.V.:1500] Out: -   BLOOD ADMINISTERED:none  DRAINS: none   LOCAL MEDICATIONS USED:  MARCAINE     SPECIMEN:  Source of Specimen:  volar ganglion, dorsal tenosynovitis  DISPOSITION OF SPECIMEN:  PATHOLOGY  COUNTS:  YES  TOURNIQUET:   Total Tourniquet Time Documented: Upper Arm (Left) - 47 minutes Total: Upper Arm (Left) - 47 minutes   DICTATION: .Other Dictation: Dictation Number [id number]  PLAN OF CARE: Discharge to home after PACU  PATIENT DISPOSITION:  PACU - hemodynamically stable.

## 2014-07-25 NOTE — Anesthesia Procedure Notes (Signed)
Procedure Name: LMA Insertion Date/Time: 07/25/2014 8:42 AM Performed by: Burnice Oestreicher Pre-anesthesia Checklist: Patient identified, Emergency Drugs available, Suction available and Patient being monitored Patient Re-evaluated:Patient Re-evaluated prior to inductionOxygen Delivery Method: Circle System Utilized Preoxygenation: Pre-oxygenation with 100% oxygen Intubation Type: IV induction Ventilation: Mask ventilation without difficulty LMA: LMA inserted LMA Size: 5.0 Number of attempts: 1 Airway Equipment and Method: bite block Placement Confirmation: positive ETCO2 Tube secured with: Tape Dental Injury: Teeth and Oropharynx as per pre-operative assessment

## 2014-07-25 NOTE — Op Note (Signed)
NAME:  [name] Foster, [name] Foster NO.:  [id number]  MEDICAL RECORD NO.:  [id number]  LOCATION:                                 FACILITY:  PHYSICIAN:  Leanora Cover, MD             DATE OF BIRTH:  DATE OF PROCEDURE:  07/25/2014 DATE OF DISCHARGE:                              OPERATIVE REPORT   PREOPERATIVE DIAGNOSIS:  Left wrist volar and dorsal ganglion.  POSTOPERATIVE DIAGNOSES:  Left wrist volar ganglion, dorsal extensor tenosynovitis.  PROCEDURE:   1. Left wrist excision of volar ganglion 2. Left wrist excision dorsal extensor tenosynovitis.  SURGEON:  Leanora Cover, MD  ASSISTANT:  None.  ANESTHESIA:  General.  IV FLUIDS:  Per anesthesia flow sheet.  ESTIMATED BLOOD LOSS:  Minimal.  COMPLICATIONS:  None.  SPECIMENS:  Volar ganglion cyst and dorsal extensor tenosynovitis to Pathology.  TOURNIQUET TIME:  47 minutes.  DISPOSITION:  Stable to PACU.  INDICATIONS:  [name] Foster is a 58 year old male who has noted masses on his left wrist for some time.  They are bothersome to him.  He wishes to have them excised.  Risks, benefits, and alternatives of surgery were discussed including risk of blood loss, infection, damage to nerves, vessels, tendons, ligaments, bone; failure of surgery; need for additional surgery, complications with wound healing, continued pain, and recurrence of mass.  He voiced understanding of these risks and elected to proceed.  OPERATIVE COURSE:  After being identified preoperatively by myself, the patient and I agreed upon procedure and site of procedure.  Surgical site was marked.  Risks, benefits, and alternatives of surgery were reviewed and she wished to proceed.  Surgical consent had been signed. He was transported to the operating room and placed on the operating table in a supine position with the left upper extremity on arm board. General anesthesia was induced by Anesthesiology.  He had been given IV antibiotics and  preoperative antibiotic prophylaxis.  General anesthesia was induced by Anesthesiology.  Left upper extremity was prepped and draped in normal sterile orthopedic fashion.  Surgical pause was performed between surgeons, anesthesia, and operating room staff, and all were in agreement as to the patient, procedure, and site of procedure.  Tourniquet at the proximal aspect of the extremity was inflated to 250 mmHg after exsanguination of the limb with an Esmarch bandage.  The volar ganglion was addressed first.  A Brunner-type incision was made and carried into subcutaneous tissues by spreading technique.  Bipolar electrocautery was used to obtain hemostasis.  The mass was identified directly underneath the radial artery.  It was freed up from this.  The mass was traced to the radiocarpal joint.  The stalk was removed.  The area was treated with bipolar electrocautery to try and induce healing.  Two figure-of-eight 4-0 Vicryl sutures were placed at the rent in the capsule to try to get it to heal.  The wound was copiously irrigated with sterile saline.  It was then closed with 4-0 nylon in a horizontal mattress fashion.  It was injected with 0.25% plain Marcaine to aid in postoperative analgesia.  Attention was turned to the dorsum of the wrist.  A transverse incision was made and carried into subcutaneous tissues by spreading technique.  The extensor tendons were surrounded by synovium and there was fluid within this.  This was debrided.  The extensor tendons to the index, long, ring, and small fingers were involved.  There were some fraying of the long and ring finger extensor tendons, but no frank rupture.  The tenosynovium was removed and sent to Pathology for examination.  The volar ganglion cyst had been sent to Pathology for examination as well.  The area underneath the tendon was inspected and no ganglion was noted.  The wound was irrigated with sterile saline.  It was then closed with  inverted interrupted Vicryl sutures in the subcutaneous tissues and a running 4-0 subcuticular Monocryl.  This was augmented with Steri-Strips.  This wound was also injected with 0.25% plain Marcaine to aid in postoperative analgesia.  In all, 8 mL of 0.25% plain Marcaine was used. The wounds were dressed with sterile Xeroform, 4x4s, and wrapped with a Kerlix bandage.  A volar splint was placed and wrapped with Kerlix and Ace bandage.  Tourniquet was deflated at 47 minutes.  Fingertips were pink with brisk capillary refill after deflation of the tourniquet. Operative drapes were broken down and the patient was awoken from anesthesia safely.  He was transferred back to the stretcher and taken to PACU in stable condition.  I will see him back in the office in 1 week for postoperative followup.  We will give him Norco 5/325, 1-2 p.o. q.6 hours p.r.n. pain, dispensed #40.     Leanora Cover, MD     KK/MEDQ  D:  07/25/2014  T:  07/25/2014  Job:  [id number]

## 2014-07-25 NOTE — Anesthesia Postprocedure Evaluation (Signed)
  Anesthesia Post-op Note  Patient: [name] Foster  Procedure(s) Performed: Procedure(s): EXCISION VOLAR AND DORSAL GANGLION LEFT WRIST (Left)  Patient Location: PACU  Anesthesia Type: General   Level of Consciousness: awake, alert  and oriented  Airway and Oxygen Therapy: Patient Spontanous Breathing  Post-op Pain: mild  Post-op Assessment: Post-op Vital signs reviewed  Post-op Vital Signs: Reviewed  Last Vitals:  Filed Vitals:   07/25/14 1045  BP: 128/70  Pulse: 79  Temp:   Resp: 16    Complications: No apparent anesthesia complications. Pt will stay overnight due to no use of his prescribed CPAP for sleep apnea.

## 2014-07-25 NOTE — Discharge Instructions (Addendum)

## 2014-07-25 NOTE — Op Note (Signed)
[id number] 

## 2014-07-26 ENCOUNTER — Encounter (HOSPITAL_BASED_OUTPATIENT_CLINIC_OR_DEPARTMENT_OTHER): Payer: Self-pay | Admitting: Orthopedic Surgery

## 2014-07-26 DIAGNOSIS — M67432 Ganglion, left wrist: Secondary | ICD-10-CM | POA: Diagnosis not present

## 2015-08-18 ENCOUNTER — Ambulatory Visit: Payer: Self-pay | Admitting: Surgery

## 2015-10-02 SURGERY — Surgical Case
Anesthesia: *Unknown

## 2015-10-03 ENCOUNTER — Ambulatory Visit
Admission: RE | Admit: 2015-10-03 | Discharge: 2015-10-03 | Disposition: A | Discharge status: Home / Self Care | Payer: 59 | Source: Ambulatory Visit | Attending: Surgery | Admitting: Surgery

## 2015-10-03 ENCOUNTER — Other Ambulatory Visit: Payer: Self-pay | Admitting: Surgery

## 2015-10-03 DIAGNOSIS — Z01818 Encounter for other preprocedural examination: Secondary | ICD-10-CM

## 2015-10-03 DIAGNOSIS — K4091 Unilateral inguinal hernia, without obstruction or gangrene, recurrent: Secondary | ICD-10-CM

## 2016-05-19 ENCOUNTER — Other Ambulatory Visit: Payer: Self-pay | Admitting: Surgery

## 2016-05-19 DIAGNOSIS — R1031 Right lower quadrant pain: Secondary | ICD-10-CM

## 2016-05-28 ENCOUNTER — Ambulatory Visit
Admission: RE | Admit: 2016-05-28 | Discharge: 2016-05-28 | Disposition: A | Discharge status: Home / Self Care | Payer: 59 | Source: Ambulatory Visit | Attending: Surgery | Admitting: Surgery

## 2016-05-28 DIAGNOSIS — R1031 Right lower quadrant pain: Secondary | ICD-10-CM

## 2016-05-28 MED ORDER — IOPAMIDOL (ISOVUE-300) INJECTION 61%
100.0000 mL | Freq: Once | INTRAVENOUS | Status: AC | PRN
  Administered 2016-05-28: 100 mL via INTRAVENOUS

## 2017-03-25 ENCOUNTER — Inpatient Hospital Stay (HOSPITAL_COMMUNITY): Payer: 59 | Admitting: Anesthesiology

## 2017-03-25 ENCOUNTER — Emergency Department (HOSPITAL_COMMUNITY): Payer: 59

## 2017-03-25 ENCOUNTER — Encounter (HOSPITAL_COMMUNITY): Payer: Self-pay | Admitting: Anesthesiology

## 2017-03-25 ENCOUNTER — Encounter (HOSPITAL_COMMUNITY): Payer: Self-pay | Admitting: *Deleted

## 2017-03-25 ENCOUNTER — Inpatient Hospital Stay (HOSPITAL_COMMUNITY): Payer: 59

## 2017-03-25 ENCOUNTER — Inpatient Hospital Stay (HOSPITAL_COMMUNITY)
Admission: EM | Admit: 2017-03-25 | Discharge: 2017-03-27 | DRG: 246 | Disposition: A | Discharge status: Home / Self Care | Payer: 59 | Attending: Cardiovascular Disease | Admitting: Cardiovascular Disease

## 2017-03-25 ENCOUNTER — Inpatient Hospital Stay (HOSPITAL_COMMUNITY)
Admission: EM | Disposition: A | Discharge status: Home / Self Care | Payer: Self-pay | Source: Home / Self Care | Attending: Cardiovascular Disease

## 2017-03-25 DIAGNOSIS — R570 Cardiogenic shock: Secondary | ICD-10-CM | POA: Diagnosis present

## 2017-03-25 DIAGNOSIS — E1165 Type 2 diabetes mellitus with hyperglycemia: Secondary | ICD-10-CM | POA: Diagnosis present

## 2017-03-25 DIAGNOSIS — E1169 Type 2 diabetes mellitus with other specified complication: Secondary | ICD-10-CM | POA: Diagnosis present

## 2017-03-25 DIAGNOSIS — N179 Acute kidney failure, unspecified: Secondary | ICD-10-CM | POA: Diagnosis present

## 2017-03-25 DIAGNOSIS — I2510 Atherosclerotic heart disease of native coronary artery without angina pectoris: Secondary | ICD-10-CM

## 2017-03-25 DIAGNOSIS — I2121 ST elevation (STEMI) myocardial infarction involving left circumflex coronary artery: Principal | ICD-10-CM | POA: Diagnosis present

## 2017-03-25 DIAGNOSIS — E119 Type 2 diabetes mellitus without complications: Secondary | ICD-10-CM

## 2017-03-25 DIAGNOSIS — I442 Atrioventricular block, complete: Secondary | ICD-10-CM | POA: Diagnosis present

## 2017-03-25 DIAGNOSIS — I5021 Acute systolic (congestive) heart failure: Secondary | ICD-10-CM | POA: Diagnosis present

## 2017-03-25 DIAGNOSIS — Z955 Presence of coronary angioplasty implant and graft: Secondary | ICD-10-CM

## 2017-03-25 DIAGNOSIS — R001 Bradycardia, unspecified: Secondary | ICD-10-CM

## 2017-03-25 DIAGNOSIS — Z96659 Presence of unspecified artificial knee joint: Secondary | ICD-10-CM | POA: Diagnosis present

## 2017-03-25 DIAGNOSIS — T50995A Adverse effect of other drugs, medicaments and biological substances, initial encounter: Secondary | ICD-10-CM | POA: Diagnosis not present

## 2017-03-25 DIAGNOSIS — J9602 Acute respiratory failure with hypercapnia: Secondary | ICD-10-CM | POA: Diagnosis not present

## 2017-03-25 DIAGNOSIS — I110 Hypertensive heart disease with heart failure: Secondary | ICD-10-CM | POA: Diagnosis present

## 2017-03-25 DIAGNOSIS — G9340 Encephalopathy, unspecified: Secondary | ICD-10-CM | POA: Diagnosis not present

## 2017-03-25 DIAGNOSIS — E876 Hypokalemia: Secondary | ICD-10-CM | POA: Diagnosis present

## 2017-03-25 DIAGNOSIS — Z9119 Patient's noncompliance with other medical treatment and regimen: Secondary | ICD-10-CM

## 2017-03-25 DIAGNOSIS — R809 Proteinuria, unspecified: Secondary | ICD-10-CM

## 2017-03-25 DIAGNOSIS — G4700 Insomnia, unspecified: Secondary | ICD-10-CM | POA: Diagnosis present

## 2017-03-25 DIAGNOSIS — Z7984 Long term (current) use of oral hypoglycemic drugs: Secondary | ICD-10-CM | POA: Diagnosis not present

## 2017-03-25 DIAGNOSIS — E118 Type 2 diabetes mellitus with unspecified complications: Secondary | ICD-10-CM

## 2017-03-25 DIAGNOSIS — I959 Hypotension, unspecified: Secondary | ICD-10-CM

## 2017-03-25 DIAGNOSIS — F17210 Nicotine dependence, cigarettes, uncomplicated: Secondary | ICD-10-CM | POA: Diagnosis present

## 2017-03-25 DIAGNOSIS — Z79899 Other long term (current) drug therapy: Secondary | ICD-10-CM

## 2017-03-25 DIAGNOSIS — I4891 Unspecified atrial fibrillation: Secondary | ICD-10-CM | POA: Diagnosis present

## 2017-03-25 DIAGNOSIS — G4733 Obstructive sleep apnea (adult) (pediatric): Secondary | ICD-10-CM | POA: Diagnosis present

## 2017-03-25 DIAGNOSIS — J9601 Acute respiratory failure with hypoxia: Secondary | ICD-10-CM

## 2017-03-25 DIAGNOSIS — E782 Mixed hyperlipidemia: Secondary | ICD-10-CM | POA: Diagnosis present

## 2017-03-25 DIAGNOSIS — I252 Old myocardial infarction: Secondary | ICD-10-CM | POA: Diagnosis present

## 2017-03-25 DIAGNOSIS — R079 Chest pain, unspecified: Secondary | ICD-10-CM

## 2017-03-25 DIAGNOSIS — I25119 Atherosclerotic heart disease of native coronary artery with unspecified angina pectoris: Secondary | ICD-10-CM | POA: Diagnosis present

## 2017-03-25 DIAGNOSIS — Y92239 Unspecified place in hospital as the place of occurrence of the external cause: Secondary | ICD-10-CM | POA: Diagnosis not present

## 2017-03-25 DIAGNOSIS — I213 ST elevation (STEMI) myocardial infarction of unspecified site: Secondary | ICD-10-CM

## 2017-03-25 DIAGNOSIS — E785 Hyperlipidemia, unspecified: Secondary | ICD-10-CM | POA: Diagnosis present

## 2017-03-25 DIAGNOSIS — I2119 ST elevation (STEMI) myocardial infarction involving other coronary artery of inferior wall: Secondary | ICD-10-CM | POA: Diagnosis present

## 2017-03-25 HISTORY — DX: Pulmonary hypertension, unspecified: I27.20

## 2017-03-25 HISTORY — PX: CORONARY/GRAFT ACUTE MI REVASCULARIZATION: CATH118305

## 2017-03-25 HISTORY — PX: TEMPORARY PACEMAKER: CATH118268

## 2017-03-25 HISTORY — DX: Type 2 diabetes mellitus without complications: E11.9

## 2017-03-25 HISTORY — PX: LEFT HEART CATH AND CORONARY ANGIOGRAPHY: CATH118249

## 2017-03-25 LAB — CBC WITH DIFFERENTIAL/PLATELET
Basophils Absolute: 0 10*3/uL (ref 0.0–0.1)
Basophils Relative: 0 %
Eosinophils Absolute: 0.1 10*3/uL (ref 0.0–0.7)
Eosinophils Relative: 2 %
HCT: 40.6 % (ref 39.0–52.0)
HEMOGLOBIN: 14.7 g/dL (ref 13.0–17.0)
Lymphocytes Relative: 45 %
Lymphs Abs: 3.4 10*3/uL (ref 0.7–4.0)
MCH: 31.5 pg (ref 26.0–34.0)
MCHC: 36.2 g/dL — ABNORMAL HIGH (ref 30.0–36.0)
MCV: 86.9 fL (ref 78.0–100.0)
MONO ABS: 0.5 10*3/uL (ref 0.1–1.0)
Monocytes Relative: 7 %
Neutro Abs: 3.5 10*3/uL (ref 1.7–7.7)
Neutrophils Relative %: 46 %
Platelets: 178 10*3/uL (ref 150–400)
RBC: 4.67 MIL/uL (ref 4.22–5.81)
RDW: 13 % (ref 11.5–15.5)
WBC: 7.6 10*3/uL (ref 4.0–10.5)

## 2017-03-25 LAB — GLUCOSE, CAPILLARY
GLUCOSE-CAPILLARY: 262 mg/dL — AB (ref 65–99)
GLUCOSE-CAPILLARY: 233 mg/dL — AB (ref 65–99)
Glucose-Capillary: 142 mg/dL — ABNORMAL HIGH (ref 65–99)
Glucose-Capillary: 156 mg/dL — ABNORMAL HIGH (ref 65–99)
Glucose-Capillary: 171 mg/dL — ABNORMAL HIGH (ref 65–99)
Glucose-Capillary: 192 mg/dL — ABNORMAL HIGH (ref 65–99)

## 2017-03-25 LAB — PROTIME-INR
INR: 0.97
Prothrombin Time: 12.8 seconds (ref 11.4–15.2)

## 2017-03-25 LAB — COMPREHENSIVE METABOLIC PANEL
ALBUMIN: 3.3 g/dL — AB (ref 3.5–5.0)
ALK PHOS: 48 U/L (ref 38–126)
ALT: 32 U/L (ref 17–63)
ANION GAP: 12 (ref 5–15)
AST: 25 U/L (ref 15–41)
BILIRUBIN TOTAL: 0.6 mg/dL (ref 0.3–1.2)
BUN: 15 mg/dL (ref 6–20)
CO2: 23 mmol/L (ref 22–32)
Calcium: 9 mg/dL (ref 8.9–10.3)
Chloride: 100 mmol/L — ABNORMAL LOW (ref 101–111)
Creatinine, Ser: 1.26 mg/dL — ABNORMAL HIGH (ref 0.61–1.24)
GFR calc Af Amer: >60 mL/min (ref >60)
GFR, EST NON AFRICAN AMERICAN: 59 mL/min — AB (ref >60)
GLUCOSE: 211 mg/dL — AB (ref 65–99)
Potassium: 3 mmol/L — ABNORMAL LOW (ref 3.5–5.1)
Sodium: 135 mmol/L (ref 135–145)
Total Protein: 6 g/dL — ABNORMAL LOW (ref 6.5–8.1)

## 2017-03-25 LAB — BASIC METABOLIC PANEL
Anion gap: 8 (ref 5–15)
BUN: 14 mg/dL (ref 6–20)
CALCIUM: 7.7 mg/dL — AB (ref 8.9–10.3)
CHLORIDE: 105 mmol/L (ref 101–111)
CO2: 22 mmol/L (ref 22–32)
CREATININE: 1.24 mg/dL (ref 0.61–1.24)
GFR calc non Af Amer: >60 mL/min (ref >60)
Glucose, Bld: 275 mg/dL — ABNORMAL HIGH (ref 65–99)
Potassium: 3.5 mmol/L (ref 3.5–5.1)
SODIUM: 135 mmol/L (ref 135–145)

## 2017-03-25 LAB — HIV ANTIBODY (ROUTINE TESTING W REFLEX): HIV SCREEN 4TH GENERATION: NONREACTIVE

## 2017-03-25 LAB — CBC
HCT: 40.3 % (ref 39.0–52.0)
Hemoglobin: 13.9 g/dL (ref 13.0–17.0)
MCH: 30.5 pg (ref 26.0–34.0)
MCHC: 34.5 g/dL (ref 30.0–36.0)
MCV: 88.4 fL (ref 78.0–100.0)
Platelets: 147 10*3/uL — ABNORMAL LOW (ref 150–400)
RBC: 4.56 MIL/uL (ref 4.22–5.81)
RDW: 13.2 % (ref 11.5–15.5)
WBC: 9.6 10*3/uL (ref 4.0–10.5)

## 2017-03-25 LAB — ECHOCARDIOGRAM COMPLETE
HEIGHTINCHES: 68 in
WEIGHTICAEL: 3054.69 [oz_av]

## 2017-03-25 LAB — APTT: aPTT: 24 seconds (ref 24–36)

## 2017-03-25 LAB — TROPONIN I
TROPONIN I: 26.9 ng/mL — AB (ref <0.03)
Troponin I: 0.07 ng/mL (ref <0.03)
Troponin I: >65 ng/mL (ref <0.03)

## 2017-03-25 LAB — LIPID PANEL
Cholesterol: 163 mg/dL (ref 0–200)
HDL: 25 mg/dL — AB (ref >40)
LDL CALC: 92 mg/dL (ref 0–99)
Total CHOL/HDL Ratio: 6.5 RATIO
Triglycerides: 229 mg/dL — ABNORMAL HIGH (ref <150)
VLDL: 46 mg/dL — ABNORMAL HIGH (ref 0–40)

## 2017-03-25 LAB — POCT I-STAT 3, ART BLOOD GAS (G3+)
ACID-BASE DEFICIT: 7 mmol/L — AB (ref 0.0–2.0)
BICARBONATE: 21.3 mmol/L (ref 20.0–28.0)
O2 SAT: 99 %
PO2 ART: 169 mmHg — AB (ref 83.0–108.0)
TCO2: 23 mmol/L (ref 0–100)
pCO2 arterial: 53.8 mmHg — ABNORMAL HIGH (ref 32.0–48.0)
pH, Arterial: 7.206 — ABNORMAL LOW (ref 7.350–7.450)

## 2017-03-25 LAB — POCT ACTIVATED CLOTTING TIME: Activated Clotting Time: 411 seconds

## 2017-03-25 LAB — MRSA PCR SCREENING: MRSA BY PCR: NEGATIVE

## 2017-03-25 LAB — BRAIN NATRIURETIC PEPTIDE: B NATRIURETIC PEPTIDE 5: 132.8 pg/mL — AB (ref 0.0–100.0)

## 2017-03-25 SURGERY — LEFT HEART CATH AND CORONARY ANGIOGRAPHY
Anesthesia: LOCAL

## 2017-03-25 MED ORDER — CHLORHEXIDINE GLUCONATE 0.12% ORAL RINSE (MEDLINE KIT)
15.0000 mL | Freq: Two times a day (BID) | OROMUCOSAL | Status: DC
  Administered 2017-03-25: 15 mL via OROMUCOSAL

## 2017-03-25 MED ORDER — HEPARIN (PORCINE) IN NACL 2-0.9 UNIT/ML-% IJ SOLN
INTRAMUSCULAR | Status: AC
  Filled 2017-03-25: qty 500

## 2017-03-25 MED ORDER — LABETALOL HCL 5 MG/ML IV SOLN: 10.0000 mg | INTRAVENOUS | Status: AC | PRN

## 2017-03-25 MED ORDER — SODIUM CHLORIDE 0.9 % IV SOLN
INTRAVENOUS | Status: AC | PRN
  Administered 2017-03-25 (×2): 1.75 mg/kg/h via INTRAVENOUS

## 2017-03-25 MED ORDER — SODIUM CHLORIDE 0.9 % IV SOLN: INTRAVENOUS | Status: DC

## 2017-03-25 MED ORDER — ASPIRIN EC 81 MG PO TBEC
81.0000 mg | DELAYED_RELEASE_TABLET | Freq: Every day | ORAL | Status: DC
  Administered 2017-03-26 – 2017-03-27 (×2): 81 mg via ORAL
  Filled 2017-03-25 (×2): qty 1

## 2017-03-25 MED ORDER — POTASSIUM CHLORIDE 20 MEQ/15ML (10%) PO SOLN
40.0000 meq | Freq: Once | ORAL | Status: AC
  Administered 2017-03-25: 40 meq
  Filled 2017-03-25: qty 30

## 2017-03-25 MED ORDER — SODIUM CHLORIDE 0.9% FLUSH: 3.0000 mL | INTRAVENOUS | Status: DC | PRN

## 2017-03-25 MED ORDER — MIDAZOLAM HCL 2 MG/2ML IJ SOLN: 2.0000 mg | INTRAMUSCULAR | Status: DC | PRN

## 2017-03-25 MED ORDER — TICAGRELOR 90 MG PO TABS
ORAL_TABLET | ORAL | Status: AC
Start: 2017-03-25 — End: 2017-03-25
  Filled 2017-03-25: qty 2

## 2017-03-25 MED ORDER — MIDAZOLAM HCL 2 MG/2ML IJ SOLN
INTRAMUSCULAR | Status: DC | PRN
  Administered 2017-03-25: 1 mg via INTRAVENOUS
  Administered 2017-03-25: 2 mg via INTRAVENOUS
  Administered 2017-03-25: 1 mg via INTRAVENOUS

## 2017-03-25 MED ORDER — SODIUM CHLORIDE 0.9% FLUSH
3.0000 mL | Freq: Two times a day (BID) | INTRAVENOUS | Status: DC
  Administered 2017-03-25 – 2017-03-27 (×5): 3 mL via INTRAVENOUS

## 2017-03-25 MED ORDER — TICAGRELOR 90 MG PO TABS
90.0000 mg | ORAL_TABLET | Freq: Two times a day (BID) | ORAL | Status: DC
  Administered 2017-03-25 – 2017-03-27 (×5): 90 mg via ORAL
  Filled 2017-03-25 (×5): qty 1

## 2017-03-25 MED ORDER — SODIUM CHLORIDE 0.9 % IV SOLN
50.0000 ug/h | INTRAVENOUS | Status: DC
  Filled 2017-03-25: qty 50

## 2017-03-25 MED ORDER — ATROPINE SULFATE 1 MG/10ML IJ SOSY
1.0000 mg | PREFILLED_SYRINGE | Freq: Once | INTRAMUSCULAR | Status: AC
  Administered 2017-03-25: 1 mg via INTRAVENOUS

## 2017-03-25 MED ORDER — ATORVASTATIN CALCIUM 80 MG PO TABS
80.0000 mg | ORAL_TABLET | Freq: Every day | ORAL | Status: DC
  Administered 2017-03-25 – 2017-03-26 (×2): 80 mg via ORAL
  Filled 2017-03-25 (×2): qty 1

## 2017-03-25 MED ORDER — PANTOPRAZOLE SODIUM 40 MG IV SOLR
40.0000 mg | Freq: Every day | INTRAVENOUS | Status: DC
  Administered 2017-03-25 – 2017-03-26 (×2): 40 mg via INTRAVENOUS
  Filled 2017-03-25 (×2): qty 40

## 2017-03-25 MED ORDER — MIDAZOLAM HCL 50 MG/10ML IJ SOLN
1.0000 mg/h | INTRAMUSCULAR | Status: DC
  Administered 2017-03-25: 1 mg/h via INTRAVENOUS
  Filled 2017-03-25: qty 10

## 2017-03-25 MED ORDER — ALBUTEROL SULFATE (2.5 MG/3ML) 0.083% IN NEBU: 2.5000 mg | INHALATION_SOLUTION | RESPIRATORY_TRACT | Status: DC | PRN

## 2017-03-25 MED ORDER — HEPARIN SODIUM (PORCINE) 5000 UNIT/ML IJ SOLN
5000.0000 [IU] | Freq: Three times a day (TID) | INTRAMUSCULAR | Status: DC
  Administered 2017-03-25 – 2017-03-27 (×6): 5000 [IU] via SUBCUTANEOUS
  Filled 2017-03-25 (×6): qty 1

## 2017-03-25 MED ORDER — SODIUM CHLORIDE 0.9 % IV BOLUS (SEPSIS)
1000.0000 mL | Freq: Once | INTRAVENOUS | Status: AC
  Administered 2017-03-25: 1000 mL via INTRAVENOUS

## 2017-03-25 MED ORDER — INSULIN ASPART 100 UNIT/ML ~~LOC~~ SOLN
2.0000 [IU] | SUBCUTANEOUS | Status: DC
Start: 2017-03-25 — End: 2017-03-27
  Administered 2017-03-25: 6 [IU] via SUBCUTANEOUS
  Administered 2017-03-25: 4 [IU] via SUBCUTANEOUS
  Administered 2017-03-25: 6 [IU] via SUBCUTANEOUS
  Administered 2017-03-25: 2 [IU] via SUBCUTANEOUS
  Administered 2017-03-25: 4 [IU] via SUBCUTANEOUS
  Administered 2017-03-26: 2 [IU] via SUBCUTANEOUS
  Administered 2017-03-26: 4 [IU] via SUBCUTANEOUS
  Administered 2017-03-26: 6 [IU] via SUBCUTANEOUS
  Administered 2017-03-26 (×3): 4 [IU] via SUBCUTANEOUS

## 2017-03-25 MED ORDER — IOPAMIDOL (ISOVUE-370) INJECTION 76%
INTRAVENOUS | Status: AC
  Filled 2017-03-25: qty 125

## 2017-03-25 MED ORDER — ONDANSETRON HCL 4 MG/2ML IJ SOLN
INTRAMUSCULAR | Status: AC
  Filled 2017-03-25: qty 2

## 2017-03-25 MED ORDER — VERAPAMIL HCL 2.5 MG/ML IV SOLN
INTRAVENOUS | Status: AC
  Filled 2017-03-25: qty 2

## 2017-03-25 MED ORDER — HYDRALAZINE HCL 20 MG/ML IJ SOLN: 5.0000 mg | INTRAMUSCULAR | Status: AC | PRN

## 2017-03-25 MED ORDER — ORAL CARE MOUTH RINSE: 15.0000 mL | Freq: Two times a day (BID) | OROMUCOSAL | Status: DC

## 2017-03-25 MED ORDER — MIDAZOLAM HCL 2 MG/2ML IJ SOLN
INTRAMUSCULAR | Status: AC
  Filled 2017-03-25: qty 2

## 2017-03-25 MED ORDER — IOPAMIDOL (ISOVUE-370) INJECTION 76%
INTRAVENOUS | Status: DC | PRN
  Administered 2017-03-25: 105 mL via INTRA_ARTERIAL

## 2017-03-25 MED ORDER — TICAGRELOR 90 MG PO TABS
ORAL_TABLET | ORAL | Status: DC | PRN
  Administered 2017-03-25: 180 mg via ORAL

## 2017-03-25 MED ORDER — BIVALIRUDIN TRIFLUOROACETATE 250 MG IV SOLR
INTRAVENOUS | Status: AC
  Filled 2017-03-25: qty 250

## 2017-03-25 MED ORDER — ONDANSETRON HCL 4 MG/2ML IJ SOLN
4.0000 mg | Freq: Once | INTRAMUSCULAR | Status: AC
  Administered 2017-03-25: 4 mg via INTRAVENOUS
  Filled 2017-03-25: qty 2

## 2017-03-25 MED ORDER — LIDOCAINE HCL (PF) 1 % IJ SOLN
INTRAMUSCULAR | Status: DC | PRN
  Administered 2017-03-25: 20 mL

## 2017-03-25 MED ORDER — NITROGLYCERIN 0.4 MG SL SUBL: 0.4000 mg | SUBLINGUAL_TABLET | SUBLINGUAL | Status: DC | PRN

## 2017-03-25 MED ORDER — ACETAMINOPHEN 325 MG PO TABS: 650.0000 mg | ORAL_TABLET | ORAL | Status: DC | PRN

## 2017-03-25 MED ORDER — HEPARIN (PORCINE) IN NACL 100-0.45 UNIT/ML-% IJ SOLN
INTRAMUSCULAR | Status: AC
  Filled 2017-03-25: qty 250

## 2017-03-25 MED ORDER — FENTANYL CITRATE (PF) 100 MCG/2ML IJ SOLN
50.0000 ug | Freq: Once | INTRAMUSCULAR | Status: AC
  Administered 2017-03-25: 50 ug via INTRAVENOUS

## 2017-03-25 MED ORDER — HEPARIN (PORCINE) IN NACL 2-0.9 UNIT/ML-% IJ SOLN
INTRAMUSCULAR | Status: AC | PRN
  Administered 2017-03-25: 1500 mL

## 2017-03-25 MED ORDER — DOPAMINE-DEXTROSE 3.2-5 MG/ML-% IV SOLN
INTRAVENOUS | Status: AC
  Filled 2017-03-25: qty 250

## 2017-03-25 MED ORDER — ATROPINE SULFATE 1 MG/10ML IJ SOSY
0.5000 mg | PREFILLED_SYRINGE | Freq: Once | INTRAMUSCULAR | Status: AC
  Administered 2017-03-25: 0.5 mg via INTRAVENOUS

## 2017-03-25 MED ORDER — NITROGLYCERIN 0.4 MG SL SUBL
SUBLINGUAL_TABLET | SUBLINGUAL | Status: AC
Start: 2017-03-25 — End: 2017-03-25
  Filled 2017-03-25: qty 1

## 2017-03-25 MED ORDER — LIDOCAINE HCL (PF) 1 % IJ SOLN
INTRAMUSCULAR | Status: AC
  Filled 2017-03-25: qty 30

## 2017-03-25 MED ORDER — ORAL CARE MOUTH RINSE
15.0000 mL | OROMUCOSAL | Status: DC
  Administered 2017-03-25: 15 mL via OROMUCOSAL

## 2017-03-25 MED ORDER — SODIUM CHLORIDE 0.9 % IV SOLN: 250.0000 mL | INTRAVENOUS | Status: DC | PRN

## 2017-03-25 MED ORDER — SODIUM CHLORIDE 0.9 % IV SOLN
25.0000 ug/h | INTRAVENOUS | Status: DC
  Administered 2017-03-25: 50 ug/h via INTRAVENOUS
  Filled 2017-03-25: qty 50

## 2017-03-25 MED ORDER — BIVALIRUDIN BOLUS VIA INFUSION - CUPID
INTRAVENOUS | Status: DC | PRN
  Administered 2017-03-25: 62.925 mg via INTRAVENOUS

## 2017-03-25 MED ORDER — MIDAZOLAM HCL 2 MG/2ML IJ SOLN
2.0000 mg | INTRAMUSCULAR | Status: DC | PRN
  Administered 2017-03-25: 2 mg via INTRAVENOUS

## 2017-03-25 MED ORDER — ONDANSETRON HCL 4 MG/2ML IJ SOLN: 4.0000 mg | Freq: Four times a day (QID) | INTRAMUSCULAR | Status: DC | PRN

## 2017-03-25 MED ORDER — FENTANYL CITRATE (PF) 100 MCG/2ML IJ SOLN
INTRAMUSCULAR | Status: DC | PRN
  Administered 2017-03-25: 25 ug via INTRAVENOUS

## 2017-03-25 MED ORDER — NITROGLYCERIN 1 MG/10 ML FOR IR/CATH LAB
INTRA_ARTERIAL | Status: DC | PRN
  Administered 2017-03-25 (×2): 200 ug via INTRACORONARY

## 2017-03-25 MED ORDER — ATROPINE SULFATE 1 MG/ML IJ SOLN: 1.0000 mg | Freq: Once | INTRAMUSCULAR | Status: DC

## 2017-03-25 MED ORDER — DOPAMINE-DEXTROSE 3.2-5 MG/ML-% IV SOLN
0.0000 ug/kg/min | INTRAVENOUS | Status: DC
  Administered 2017-03-25: 5 ug/kg/min via INTRAVENOUS

## 2017-03-25 MED ORDER — CHLORHEXIDINE GLUCONATE 0.12% ORAL RINSE (MEDLINE KIT): 15.0000 mL | Freq: Two times a day (BID) | OROMUCOSAL | Status: DC

## 2017-03-25 MED ORDER — ORAL CARE MOUTH RINSE
15.0000 mL | Freq: Four times a day (QID) | OROMUCOSAL | Status: DC
Start: 2017-03-25 — End: 2017-03-25

## 2017-03-25 MED ORDER — HEPARIN SODIUM (PORCINE) 5000 UNIT/ML IJ SOLN
4000.0000 [IU] | Freq: Once | INTRAMUSCULAR | Status: AC
  Administered 2017-03-25: 4000 [IU] via INTRAVENOUS

## 2017-03-25 MED ORDER — HEPARIN (PORCINE) IN NACL 100-0.45 UNIT/ML-% IJ SOLN
16.0000 [IU]/kg/h | Freq: Once | INTRAMUSCULAR | Status: AC
  Administered 2017-03-25: 16 [IU]/kg/h via INTRAVENOUS

## 2017-03-25 MED ORDER — FENTANYL CITRATE (PF) 100 MCG/2ML IJ SOLN
INTRAMUSCULAR | Status: AC
  Filled 2017-03-25: qty 2

## 2017-03-25 MED ORDER — FENTANYL BOLUS VIA INFUSION
50.0000 ug | INTRAVENOUS | Status: DC | PRN
  Filled 2017-03-25: qty 50

## 2017-03-25 SURGICAL SUPPLY — 16 items
BALLN EMERGE MR 2.5X15 (BALLOONS) ×2
BALLOON EMERGE MR 2.5X15 (BALLOONS) ×1 IMPLANT
CATH INFINITI 5FR MULTPACK ANG (CATHETERS) ×2 IMPLANT
CATH LAUNCHER 6FR EBU3.5 (CATHETERS) ×2 IMPLANT
CATH S G BIP PACING (SET/KITS/TRAYS/PACK) ×2 IMPLANT
ELECT DEFIB PAD ADLT CADENCE (PAD) ×2 IMPLANT
KIT ENCORE 26 ADVANTAGE (KITS) ×2 IMPLANT
KIT HEART LEFT (KITS) ×2 IMPLANT
PACK CARDIAC CATHETERIZATION (CUSTOM PROCEDURE TRAY) ×2 IMPLANT
SHEATH PINNACLE 6F 10CM (SHEATH) ×4 IMPLANT
SLEEVE REPOSITIONING LENGTH 30 (MISCELLANEOUS) ×2 IMPLANT
STENT PROMUS PREM MR 3.5X24 (Permanent Stent) ×2 IMPLANT
TRANSDUCER W/STOPCOCK (MISCELLANEOUS) ×2 IMPLANT
TUBING CIL FLEX 10 FLL-RA (TUBING) ×2 IMPLANT
WIRE COUGAR XT STRL 190CM (WIRE) ×2 IMPLANT
WIRE EMERALD 3MM-J .035X150CM (WIRE) ×2 IMPLANT

## 2017-03-25 NOTE — Progress Notes (Signed)
PULMONARY / CRITICAL CARE MEDICINE   Name: [name] Foster MRN: [id number] DOB: November 26, 1955    ADMISSION DATE:  03/25/2017 CONSULTATION DATE:  03/25/17  REFERRING MD:  Irish Lack  CHIEF COMPLAINT:  STEMI  HISTORY OF PRESENT ILLNESS:  Pt is encephelopathic; therefore, this HPI is obtained from chart review. [name] Foster is a 61 y.o. male with PMH as outlined below. He presented to [facility] evening of 7/12 with chest discomfort and was found to have inferior STEMI with CHB and hypotension.  He was started on external pacing and was transferred to [facility].  He was taken to cath lab where he had DES placed to the circumflex artery along with temp venous pacer.  During the procedure, he was intubated by anesthesia and PCCM was then called to assist with vent management.  Post procedure, HR in 130's with temp pacing. SBP in 120's.  SUBJECTIVE:  No events overnight, asking to urinate  VITAL SIGNS: BP 112/86   Pulse 70   Temp 97.8 F (36.6 C) (Axillary)   Resp 20   Ht 5\' 8"  (1.727 m)   Wt 86.6 kg (190 lb 14.7 oz)   SpO2 99%   BMI 29.03 kg/m   HEMODYNAMICS:    VENTILATOR SETTINGS: Vent Mode: PRVC FiO2 (%):  [60 %-100 %] 60 % Set Rate:  [14 bmp-20 bmp] 20 bmp Vt Set:  [550 mL] 550 mL PEEP:  [5 cmH20] 5 cmH20 Plateau Pressure:  [18 cmH20-19 cmH20] 19 cmH20  INTAKE / OUTPUT: I/O last 3 completed shifts: In: 396.4 [I.V.:196.4; NG/GT:200] Out: 340 [Urine:340]  PHYSICAL EXAMINATION: General: Adult male, resting in bed, in NAD, intubated but interactive Neuro: Awake, following commands, intubated, moving all ext HEENT: [place]/AT, PERRL, EOM-I and MMM Cardiovascular: RRR, Nl S1/S2, -M/R/G. Lungs: CTA bilaterally Abdomen: Soft, NT, ND and +BS Musculoskeletal: No gross deformities, no edema.  Skin: Intact, warm, no rashes.  LABS:  BMET  Recent Labs Lab 03/25/17 0059 03/25/17 0427  NA 135 135  K 3.0* 3.5  CL 100* 105  CO2 23 22  BUN 15 14  CREATININE 1.26* 1.24  GLUCOSE 211*  275*    Electrolytes  Recent Labs Lab 03/25/17 0059 03/25/17 0427  CALCIUM 9.0 7.7*    CBC  Recent Labs Lab 03/25/17 0059 03/25/17 0427  WBC 7.6 9.6  HGB 14.7 13.9  HCT 40.6 40.3  PLT 178 147*    Coag's  Recent Labs Lab 03/25/17 0059  APTT 24  INR 0.97    Sepsis Markers No results for input(s): LATICACIDVEN, PROCALCITON, O2SATVEN in the last 168 hours.  ABG No results for input(s): PHART, PCO2ART, PO2ART in the last 168 hours.  Liver Enzymes  Recent Labs Lab 03/25/17 0059  AST 25  ALT 32  ALKPHOS 48  BILITOT 0.6  ALBUMIN 3.3*    Cardiac Enzymes  Recent Labs Lab 03/25/17 0059 03/25/17 0427  TROPONINI 0.07* 26.90*    Glucose  Recent Labs Lab 03/25/17 0522 03/25/17 0727  GLUCAP 262* 233*    Imaging Portable Chest Xray  Result Date: 03/25/2017 CLINICAL DATA:  61 year old male with respiratory failure status post endotracheal tube placement. EXAM: PORTABLE CHEST 1 VIEW COMPARISON:  Radiograph dated 03/25/2017 FINDINGS: An endotracheal tube is seen with tip approximately 2.5 cm above the carina. Recommend retraction by approximately 2- 3 cm for optimal positioning. And enteric tube with tip in the left upper abdomen noted. There is shallow inspiration increased interstitial and bronchovascular markings concerning for edema although atypical pneumonia is not excluded.  There is no focal consolidation, pleural effusion, or pneumothorax. The cardiac silhouette is within normal limits. No acute osseous pathology. IMPRESSION: 1. Interval placement of an endotracheal and an enteric tube. The endotracheal tube is approximately 2.5 cm above the carina. Recommend retraction by 2-3 cm for optimal positioning. The enteric tube terminates in the left upper abdomen. 2. Shallow inspiration with progression of interstitial and bronchovascular markings compared to the earlier radiograph. Electronically Signed   By: Anner Crete M.D.   On: 03/25/2017 04:45   Dg  Chest Port 1 View  Result Date: 03/25/2017 CLINICAL DATA:  Awoke with chest pain. EXAM: PORTABLE CHEST 1 VIEW COMPARISON:  10/03/2015 FINDINGS: Low lung volumes limit assessment. Grossly unchanged heart size and mediastinal contours. There is bronchovascular crowding with bronchial thickening. No confluent airspace disease. No large pleural effusion or pneumothorax. Stable osseous structures. IMPRESSION: Low lung volumes with bronchovascular crowding and mild bronchial thickening. Electronically Signed   By: Jeb Levering M.D.   On: 03/25/2017 01:26   STUDIES:  CXR 7/13 > low volumes, mild bronchial thickening.  CULTURES: None.  ANTIBIOTICS: None.  SIGNIFICANT EVENTS: 7/13 > admit.  LINES/TUBES: ETT 7/13>>>7/14 R femoral venous pacer 7/13>>>  DISCUSSION: 61 y.o. male admitted 7/13 with inferior STEMI.  Taken to cath lab where he had DES placed to circumflex along with temp venous pacer.  He was intubated during the procedure and PCCM was asked to assist with vent management.  ASSESSMENT / PLAN:  CARDIOVASCULAR A:  Inferior STEMI - s/p cath with DES placed to Circumflex. CHB - s/p temp venous pacer 7/13. Hx HTN, HLD. P:  Cardiology following / managing. Continue heparin and angiomax. Pacer per cards Dopamine off  PULMONARY A: Respiratory insufficiency - s/p intubation by anesthesia during cardiac cath. Hx OSA. P:   Titrate O2 for sat of 88-92% VAP prevention measures. SBT to extubate today  RENAL A:   Hypokalemia. AKI. P:   Replace electrolytes as indicated IVF per cardiology NS at 100 ml/hr. BMP in AM.  GASTROINTESTINAL A:   GI prophylaxis. Nutrition. P:  SUP: Pantoprazole. NPO as anticipate extubation today  HEMATOLOGIC A:   VTE Prophylaxis. P:  CBC in AM SCDs Heparin Transfuse per ICU protocol  INFECTIOUS A:   No indication of infection. P:   Monitor WBC and fever curve  ENDOCRINE A:   Hx DM.  P:   SSI. CBGs  NEUROLOGIC A:    Acute encephalopathy - due to sedation. P:   Sedation:  D/C Fentanyl and versed D/C all sedation for extubation Daily WUA.  Family updated: Patient and son updated bedside  Interdisciplinary Family Meeting v Palliative Care Meeting:  Due by: 04/01/17.  The patient is critically ill with multiple organ systems failure and requires high complexity decision making for assessment and support, frequent evaluation and titration of therapies, application of advanced monitoring technologies and extensive interpretation of multiple databases.   Critical Care Time devoted to patient care services described in this note is  50  Minutes. This time reflects time of care of this signee Dr Jennet Maduro. This critical care time does not reflect procedure time, or teaching time or supervisory time of PA/NP/Med student/Med Resident etc but could involve care discussion time.  Rush Farmer, M.D. [facility] Pulmonary/Critical Care Medicine. Pager: [phone]. After hours pager: [phone].

## 2017-03-25 NOTE — H&P (Signed)
[name] Foster is an 61 y.o. male.   Primary Cardiologist: new PMD:  Chief Complaint: chest pain HPI: 61 y/o with a h/o HTN, and hyperlipidemia.  He presented with chest discomfort starting at about 11 PM. He went to the emergency room. Initial ECG showed some slight inferior ST changes. His pain got worse and he had an ECG with marked ST elevation. There is also complete heart block and hypotension noted. The patient was briefly externally paced. He was given several doses of atropine, but he remained bradycardic.  Due to the acute inferior MI, he is transferred to [facility] for emergent cardiac catheterization. In transport, I recommended dopamine to try to help with his hypotension and low heart rate.  Most of the history is obtained from my conversation with Dr. Tomi Bamberger. Patient is unable to provide meaningful history due to being uncomfortable and lethargic.  Past Medical History:  Diagnosis Date  . Diabetes mellitus without complication ([place])   . High cholesterol   . Hyperlipidemia   . Hypertension   . Insomnia   . Sleep apnea    could not use a cpap  . Wears dentures    full top-partial bottom  . Wears glasses     Past Surgical History:  Procedure Laterality Date  . COLONOSCOPY    . GANGLION CYST EXCISION Left 07/25/2014   Procedure: EXCISION VOLAR AND DORSAL GANGLION LEFT WRIST;  Surgeon: Leanora Cover, MD;  Location: [place];  Service: Orthopedics;  Laterality: Left;  . KNEE ARTHROSCOPY     rifgr and left  . SHOULDER ARTHROSCOPY  2012   left  . SHOULDER ARTHROSCOPY     right  . TONSILLECTOMY    . TOTAL KNEE ARTHROPLASTY  2002   left    No family history on file. Social History:  reports that he has been smoking.  He has been smoking about 1.00 pack per day. He has never used smokeless tobacco. He reports that he drinks alcohol. He reports that he does not use drugs.  Allergies: No Known Allergies  Medications Prior to Admission  Medication Sig  Dispense Refill  . amLODipine (NORVASC) 5 MG tablet Take 1 tablet by mouth daily.     . chlorthalidone (HYGROTON) 25 MG tablet TAKE 1 TABLET (25 MG TOTAL) BY MOUTH DAILY. 15 tablet 0  . glipiZIDE (GLUCOTROL) 5 MG tablet Take 5 mg by mouth daily before breakfast.    . HYDROcodone-acetaminophen (NORCO) 5-325 MG per tablet 1-2 tabs po q6 hours prn pain 40 tablet 0  . HYDROcodone-acetaminophen (NORCO/VICODIN) 5-325 MG per tablet Take 1 tablet by mouth every 6 (six) hours as needed for moderate pain.    . ramipril (ALTACE) 10 MG tablet Take 1 tablet by mouth daily.     . rosuvastatin (CRESTOR) 10 MG tablet Take 10 mg by mouth daily.      Marland Kitchen zolpidem (AMBIEN) 10 MG tablet Take 1 tablet by mouth every night.       Results for orders placed or performed during the hospital encounter of 03/25/17 (from the past 48 hour(s))  Comprehensive metabolic panel     Status: Abnormal   Collection Time: 03/25/17 12:59 AM  Result Value Ref Range   Sodium 135 135 - 145 mmol/L   Potassium 3.0 (L) 3.5 - 5.1 mmol/L   Chloride 100 (L) 101 - 111 mmol/L   CO2 23 22 - 32 mmol/L   Glucose, Bld 211 (H) 65 - 99 mg/dL   BUN 15  6 - 20 mg/dL   Creatinine, Ser 1.26 (H) 0.61 - 1.24 mg/dL   Calcium 9.0 8.9 - 10.3 mg/dL   Total Protein 6.0 (L) 6.5 - 8.1 g/dL   Albumin 3.3 (L) 3.5 - 5.0 g/dL   AST 25 15 - 41 U/L   ALT 32 17 - 63 U/L   Alkaline Phosphatase 48 38 - 126 U/L   Total Bilirubin 0.6 0.3 - 1.2 mg/dL   GFR calc non Af Amer 59 (L) >60 mL/min   GFR calc Af Amer >60 >60 mL/min    Comment: (NOTE) The eGFR has been calculated using the CKD EPI equation. This calculation has not been validated in all clinical situations. eGFR's persistently <60 mL/min signify possible Chronic Kidney Disease.    Anion gap 12 5 - 15  CBC with Differential     Status: Abnormal   Collection Time: 03/25/17 12:59 AM  Result Value Ref Range   WBC 7.6 4.0 - 10.5 K/uL   RBC 4.67 4.22 - 5.81 MIL/uL   Hemoglobin 14.7 13.0 - 17.0 g/dL   HCT  40.6 39.0 - 52.0 %   MCV 86.9 78.0 - 100.0 fL   MCH 31.5 26.0 - 34.0 pg   MCHC 36.2 (H) 30.0 - 36.0 g/dL   RDW 13.0 11.5 - 15.5 %   Platelets 178 150 - 400 K/uL   Neutrophils Relative % 46 %   Neutro Abs 3.5 1.7 - 7.7 K/uL   Lymphocytes Relative 45 %   Lymphs Abs 3.4 0.7 - 4.0 K/uL   Monocytes Relative 7 %   Monocytes Absolute 0.5 0.1 - 1.0 K/uL   Eosinophils Relative 2 %   Eosinophils Absolute 0.1 0.0 - 0.7 K/uL   Basophils Relative 0 %   Basophils Absolute 0.0 0.0 - 0.1 K/uL  Protime-INR     Status: None   Collection Time: 03/25/17 12:59 AM  Result Value Ref Range   Prothrombin Time 12.8 11.4 - 15.2 seconds   INR 0.97   APTT     Status: None   Collection Time: 03/25/17 12:59 AM  Result Value Ref Range   aPTT 24 24 - 36 seconds  Troponin I     Status: Abnormal   Collection Time: 03/25/17 12:59 AM  Result Value Ref Range   Troponin I 0.07 (HH) <0.03 ng/mL    Comment: CRITICAL RESULT CALLED TO, READ BACK BY AND VERIFIED WITH: BELTON,C AT 0200 ON 7.13.2018 BY ISLEY,B    Dg Chest Port 1 View  Result Date: 03/25/2017 CLINICAL DATA:  Awoke with chest pain. EXAM: PORTABLE CHEST 1 VIEW COMPARISON:  10/03/2015 FINDINGS: Low lung volumes limit assessment. Grossly unchanged heart size and mediastinal contours. There is bronchovascular crowding with bronchial thickening. No confluent airspace disease. No large pleural effusion or pneumothorax. Stable osseous structures. IMPRESSION: Low lung volumes with bronchovascular crowding and mild bronchial thickening. Electronically Signed   By: Jeb Levering M.D.   On: 03/25/2017 01:26    ROS: Unable to obtain, patient is lethargic  OBJECTIVE:   Vitals:   Vitals:   03/25/17 0034 03/25/17 0100 03/25/17 0119  BP: 120/78 (!) 65/44 (!) 55/36  Pulse: 74  (!) 32  Resp: '20 13 20  ' Temp: 98.2 F (36.8 C)    TempSrc: Oral    SpO2: 96%  99%  Weight: 185 lb (83.9 kg)    Height: '5\' 8"'  (1.727 m)     I&O's:  No intake or output data in the 24  hours ending  03/25/17 0208      PHYSICAL EXAM General: Well developed, well nourished, lethargic, restless Head:   Normal cephalic and atramatic  Lungs: Coarse breath sounds bilaterally to auscultation. Heart:  Bradycardic S1 S2  No JVD.   Abdomen: abdomen soft and non-tender Msk:  Back normal,  Normal strength and tone for age. Extremities:   No edema.  Hernia scar at right groin Neuro: restless Psych:  restless  LABS: Basic Metabolic Panel:  Recent Labs  03/25/17 0059  NA 135  K 3.0*  CL 100*  CO2 23  GLUCOSE 211*  BUN 15  CREATININE 1.26*  CALCIUM 9.0   Liver Function Tests:  Recent Labs  03/25/17 0059  AST 25  ALT 32  ALKPHOS 48  BILITOT 0.6  PROT 6.0*  ALBUMIN 3.3*   No results for input(s): LIPASE, AMYLASE in the last 72 hours. CBC:  Recent Labs  03/25/17 0059  WBC 7.6  NEUTROABS 3.5  HGB 14.7  HCT 40.6  MCV 86.9  PLT 178   Cardiac Enzymes:  Recent Labs  03/25/17 0059  TROPONINI 0.07*   BNP: Invalid input(s): POCBNP D-Dimer: No results for input(s): DDIMER in the last 72 hours. Hemoglobin A1C: No results for input(s): HGBA1C in the last 72 hours. Fasting Lipid Panel: No results for input(s): CHOL, HDL, LDLCALC, TRIG, CHOLHDL, LDLDIRECT in the last 72 hours. Thyroid Function Tests: No results for input(s): TSH, T4TOTAL, T3FREE, THYROIDAB in the last 72 hours.  Invalid input(s): FREET3 Anemia Panel: No results for input(s): VITAMINB12, FOLATE, FERRITIN, TIBC, IRON, RETICCTPCT in the last 72 hours. Coag Panel:   Lab Results  Component Value Date   INR 0.97 03/25/2017       Assessment/Plan Cardiac: Acute inferoposterior MI. Plan is for emergent cardiac catheterization. Further plans will depend on the result. He will need aggressive secondary prevention including lipid-lowering therapy, blood pressure management. He will not be a candidate for beta blocker most likely due to his complete heart block at this time. Hopefully,  this will resolve with revascularization.  Due to his lethargy, he may need to be intubated for the procedure if he is Unable to lie still.  Larae Grooms 03/25/2017, 2:08 AM  Pt evaluated with Dr Irish Lack. I have discussed his case with Dr Tomi Bamberger. He presents directly to the cardiac catheterization lab in the setting of an acute inferoposterior STEMI and complete heart block. On exam he is awake and alert, but uncomfortable and moving around quite a bit. Unable to lie still. HEENT: Edentulous, JVP is normal, lungs have diffuse wheezing, heart is bradycardic and regular without murmur or gallop, abdomen is soft and nontender, extremities have no edema peripheral pulses are diminished but palpable. EKG shows complete heart block with acute inferoposterior injury. Plans as outlined above for emergency cardiac catheterization and PCI.  Sherren Mocha 03/25/2017 3:55 AM

## 2017-03-25 NOTE — Consult Note (Signed)
PULMONARY / CRITICAL CARE MEDICINE   Name: [name] Foster MRN: [id number] DOB: 28-Aug-1956    ADMISSION DATE:  03/25/2017 CONSULTATION DATE:  03/25/17  REFERRING MD:  Irish Lack  CHIEF COMPLAINT:  STEMI  HISTORY OF PRESENT ILLNESS:  Pt is encephelopathic; therefore, this HPI is obtained from chart review. [name] Foster is a 61 y.o. male with PMH as outlined below. He presented to [facility] evening of 7/12 with chest discomfort and was found to have inferior STEMI with CHB and hypotension.  He was started on external pacing and was transferred to [facility].  He was taken to cath lab where he had DES placed to the circumflex artery along with temp venous pacer.  During the procedure, he was intubated by anesthesia and PCCM was then called to assist with vent management.  Post procedure, HR in 130's with temp pacing. SBP in 120's.  PAST MEDICAL HISTORY :  He  has a past medical history of Diabetes mellitus without complication ([place]); High cholesterol; Hyperlipidemia; Hypertension; Insomnia; Sleep apnea; Wears dentures; and Wears glasses.  PAST SURGICAL HISTORY: He  has a past surgical history that includes Tonsillectomy; Colonoscopy; Total Foster arthroplasty (2002); Foster arthroscopy; Shoulder arthroscopy (2012); Shoulder arthroscopy; and Ganglion cyst excision (Left, 07/25/2014).  No Known Allergies  No current facility-administered medications on file prior to encounter.    Current Outpatient Prescriptions on File Prior to Encounter  Medication Sig  . amLODipine (NORVASC) 5 MG tablet Take 1 tablet by mouth daily.   . chlorthalidone (HYGROTON) 25 MG tablet TAKE 1 TABLET (25 MG TOTAL) BY MOUTH DAILY.  Marland Kitchen glipiZIDE (GLUCOTROL) 5 MG tablet Take 5 mg by mouth daily before breakfast.  . HYDROcodone-acetaminophen (NORCO) 5-325 MG per tablet 1-2 tabs po q6 hours prn pain  . HYDROcodone-acetaminophen (NORCO/VICODIN) 5-325 MG per tablet Take 1 tablet by mouth every 6 (six) hours as needed for moderate pain.   . ramipril (ALTACE) 10 MG tablet Take 1 tablet by mouth daily.   . rosuvastatin (CRESTOR) 10 MG tablet Take 10 mg by mouth daily.    Marland Kitchen zolpidem (AMBIEN) 10 MG tablet Take 1 tablet by mouth every night.     FAMILY HISTORY:  His has no family status information on file.    SOCIAL HISTORY: He  reports that he has been smoking.  He has been smoking about 1.00 pack per day. He has never used smokeless tobacco. He reports that he drinks alcohol. He reports that he does not use drugs.  REVIEW OF SYSTEMS:   Unable to obtain as pt is encephalopathic.  SUBJECTIVE:  On vent, unresponsive.  VITAL SIGNS: BP (!) 55/36   Pulse (!) 32   Temp 98.2 F (36.8 C) (Oral)   Resp 20   Ht 5\' 8"  (1.727 m)   Wt 83.9 kg (185 lb)   SpO2 99%   BMI 28.13 kg/m   HEMODYNAMICS:    VENTILATOR SETTINGS:    INTAKE / OUTPUT: No intake/output data recorded.   PHYSICAL EXAMINATION: General: Adult male, resting in bed, in NAD. Neuro: Sedated, somewhat agitated but not following commands. HEENT: [place]/AT. PERRL, sclerae anicteric. Cardiovascular: RRR, no M/R/G.  Lungs: Respirations even and unlabored.  CTA bilaterally, No W/R/R. Abdomen: BS x 4, soft, NT/ND.  Musculoskeletal: No gross deformities, no edema.  Skin: Intact, warm, no rashes.  LABS:  BMET  Recent Labs Lab 03/25/17 0059  NA 135  K 3.0*  CL 100*  CO2 23  BUN 15  CREATININE 1.26*  GLUCOSE 211*  Electrolytes  Recent Labs Lab 03/25/17 0059  CALCIUM 9.0    CBC  Recent Labs Lab 03/25/17 0059  WBC 7.6  HGB 14.7  HCT 40.6  PLT 178    Coag's  Recent Labs Lab 03/25/17 0059  APTT 24  INR 0.97    Sepsis Markers No results for input(s): LATICACIDVEN, PROCALCITON, O2SATVEN in the last 168 hours.  ABG No results for input(s): PHART, PCO2ART, PO2ART in the last 168 hours.  Liver Enzymes  Recent Labs Lab 03/25/17 0059  AST 25  ALT 32  ALKPHOS 48  BILITOT 0.6  ALBUMIN 3.3*    Cardiac Enzymes  Recent  Labs Lab 03/25/17 0059  TROPONINI 0.07*    Glucose No results for input(s): GLUCAP in the last 168 hours.  Imaging Dg Chest Port 1 View  Result Date: 03/25/2017 CLINICAL DATA:  Awoke with chest pain. EXAM: PORTABLE CHEST 1 VIEW COMPARISON:  10/03/2015 FINDINGS: Low lung volumes limit assessment. Grossly unchanged heart size and mediastinal contours. There is bronchovascular crowding with bronchial thickening. No confluent airspace disease. No large pleural effusion or pneumothorax. Stable osseous structures. IMPRESSION: Low lung volumes with bronchovascular crowding and mild bronchial thickening. Electronically Signed   By: Jeb Levering M.D.   On: 03/25/2017 01:26     STUDIES:  CXR 7/13 > low volumes, mild bronchial thickening.  CULTURES: None.  ANTIBIOTICS: None.  SIGNIFICANT EVENTS: 7/13 > admit.  LINES/TUBES: ETT 7/13 >  R femoral venous pacer 7/13 >    DISCUSSION: 61 y.o. male admitted 7/13 with inferior STEMI.  Taken to cath lab where he had DES placed to circumflex along with temp venous pacer.  He was intubated during the procedure and PCCM was asked to assist with vent management.  ASSESSMENT / PLAN:  CARDIOVASCULAR A:  Inferior STEMI - s/p cath with DES placed to Circumflex. CHB - s/p temp venous pacer 7/13. Hx HTN, HLD. P:  Cardiology following / managing. Continue heparin and angiomax.  PULMONARY A: Respiratory insufficiency - s/p intubation by anesthesia during cardiac cath. Hx OSA. P:   Full vent support. Wean as able. VAP prevention measures. SBT in AM if able. CXR in AM.  RENAL A:   Hypokalemia. AKI. P:   2mEq K per tube. NS @ 100. BMP in AM.  GASTROINTESTINAL A:   GI prophylaxis. Nutrition. P:  SUP: Pantoprazole. NPO.  HEMATOLOGIC A:   VTE Prophylaxis. P:  SCD's / heparin. CBC in AM.  INFECTIOUS A:   No indication of infection. P:   Monitor clinically.  ENDOCRINE A:   Hx DM.  P:   SSI.  NEUROLOGIC A:    Acute encephalopathy - due to sedation. P:   Sedation:  Fentanyl gtt / Midazolam PRN. RASS goal: 0 to -1. Daily WUA.  Family updated: Wife and son updated at bedside.  Interdisciplinary Family Meeting v Palliative Care Meeting:  Due by: 04/01/17.  CC time: 35 min.  Montey Hora, [place] Pulmonary & Critical Care Medicine Pager: [phone]  or [phone] 03/25/2017, 3:27 AM   Attending Addendum: I personally examined this patient and agree with plan as discussed above. Briefly this is a 54yoM with DM, HTN, OSA, and Active tobacco abuse, now admitted with inferior STEMI, complicated by complete AV block and hypotension, taken emergently to the cath lab where he was intubated, DES placed to circumflex, and transvenous pacer placed in right femoral. On my exam patient intubated but undersedated, trying to lift head  off bed and grabbing for ETT, not obeying commands. HR 110's Afib. BP normotensive. Lungs coarse b/l. Started Fentanyl gtt with PRN versed for sedation. Start SSI for his DM. RN to place OG tube and Foley catheter. ABG shows acute hypercapneic respiratory failure, for which increased RR from 14 to 20. Repeat ABG in AM. Hypokalemia on labs has been repleted. CXR ordered to confirm ETT placement.   45 min critical care time  Vernie Murders, MD Pulmonary & Critical Care

## 2017-03-25 NOTE — Progress Notes (Signed)
Leftover fentanyl and versed wasted in sink from IV bags. ~200cc fentanyl and ~40cc versed wasted with Everlene Other, RN witnessing.

## 2017-03-25 NOTE — Procedures (Signed)
Extubation Procedure Note  Patient Details:   Name: [name] Foster DOB: Dec 30, 1955 MRN: [id number]   Airway Documentation:     Evaluation  O2 sats: stable throughout Complications: No apparent complications Patient did tolerate procedure well. Bilateral Breath Sounds: Clear, Diminished   Yes  Patient tolerated wean. MD ordered to extubate. Positive for cuff leak. Patient extubated to a 4 Lpm nasal cannula. No signs of dyspnea or stridor. Patient resting comfortably.   Myrtie Neither 03/25/2017, 10:45 AM

## 2017-03-25 NOTE — ED Notes (Signed)
ED Provider at bedside. 

## 2017-03-25 NOTE — Progress Notes (Signed)
CXR showed ETT is a little a too low almost at the carina. Withdrew ETT 1cm and secured it. Patient tolerated well.

## 2017-03-25 NOTE — Progress Notes (Signed)
   03/25/17 0225  Clinical Encounter Type  Visited With Family  Visit Type Code  Spiritual Encounters  Spiritual Needs Emotional  Stress Factors  Patient Stress Factors Not reviewed  Family Stress Factors Family relationships  Introduction to family. [employer] of presence.

## 2017-03-25 NOTE — Anesthesia Procedure Notes (Signed)
Procedure Name: Intubation Date/Time: 03/25/2017 3:10 AM Performed by: Maude Leriche D Pre-anesthesia Checklist: Patient identified, Emergency Drugs available, Patient being monitored, Suction available and Timeout performed Patient Re-evaluated:Patient Re-evaluated prior to induction Oxygen Delivery Method: Ambu bag Preoxygenation: Pre-oxygenation with 100% oxygen Induction Type: IV induction, Rapid sequence and Cricoid Pressure applied Ventilation: Mask ventilation without difficulty Laryngoscope Size: Glidescope Grade View: Grade I Tube type: Subglottic suction tube Tube size: 7.5 mm Number of attempts: 1 Airway Equipment and Method: Stylet and Video-laryngoscopy Placement Confirmation: ETT inserted through vocal cords under direct vision,  positive ETCO2 and breath sounds checked- equal and bilateral Secured at: 22 cm Tube secured with: Tape Dental Injury: Teeth and Oropharynx as per pre-operative assessment

## 2017-03-25 NOTE — Progress Notes (Addendum)
Arrival to ICU.  

## 2017-03-25 NOTE — Progress Notes (Signed)
  Echocardiogram 2D Echocardiogram has been performed.  [name] Foster 03/25/2017, 5:10 PM

## 2017-03-25 NOTE — Progress Notes (Signed)
Per Dr. Burt Knack defer pulling sheaths until ~0800/0900 this AM. Also said to continue Angiomax for two hours.  Will continue to monitor.

## 2017-03-25 NOTE — Progress Notes (Signed)
S/W [facility] @ [place] RX # [phone]    1. BRILINTA  90 MG BID   COVER- YES  CO-PAY- MAX OF $75.00  TIER- 3 DRUG  PRIOR APPROVAL- NO   PHARMACY : [facility]

## 2017-03-25 NOTE — Care Management Note (Addendum)
Case Management Note  Patient Details  Name: [name] Foster MRN: [id number] Date of Birth: 01/14/1956  Subjective/Objective: from home with wife, pta indep,  From [name] presents with  inferior STEMI with CHB and hypotension.  He was started on external pacing and was transferred to [facility], during procedure was intubated, conts to be on vent , he will be on brilinta, extubated off vent,  NCM awaiting benefit check.  NCM gave patient brilinta $5 coupon card, he states he will [facility] to [facility] [place].                 Action/Plan: NCM will follow for dc needs.   Expected Discharge Date:                  Expected Discharge Plan:     In-House Referral:     Discharge planning Services  CM Consult  Post Acute Care Choice:    Choice offered to:     DME Arranged:    DME Agency:     HH Arranged:    HH Agency:     Status of Service:  In process, will continue to follow  If discussed at Long Length of Stay Meetings, dates discussed:    Additional Comments:  Zenon Mayo, RN 03/25/2017, 8:44 AM

## 2017-03-25 NOTE — Progress Notes (Signed)
Site area: right groin (venous and arterial sheath)  Site Prior to Removal:  Level 0  Pressure Applied For 20 MINUTES    Minutes Beginning at 1300  Manual:   Yes.    Patient Status During Pull:  stable  Post Pull Groin Site:  Level 0  Post Pull Instructions Given:  Yes.    Post Pull Pulses Present:  Yes.    Dressing Applied:  Yes.    Comments:  Pulled by Cammy Brochure RN with [facility] beginning at 1320 until 1920. Patient and family educated and verbalized understanding.

## 2017-03-25 NOTE — Progress Notes (Signed)
CRITICAL VALUE ALERT  Critical Value:  Troponin >65.00 x2  Date & Time Notied:  03/25/17 1600  Provider Notified: Dr Ellyn Hack  Orders Received/Actions taken: no further orders at this time, expected finding. Pt denies chest pain/pressure/discomfort, VSS, resting comfortably in bed.

## 2017-03-25 NOTE — Progress Notes (Signed)
Transvenous temporary pacemaker pulled per Dr. Darcus Pester orders. No arrhythmias or distress during removal. Will continue to monitor closely.

## 2017-03-25 NOTE — ED Triage Notes (Signed)
Pt arrived to er by [facility] for further evaluation of chest pressure that woke him up from sleep, pt describes that chest discomfort as a pressure that radiates to bilateral arms and to neck area with nausea. Did take 324 mg aspirin prior to ems arrival, was given one SL nitro with improvement in pain by ems but pain returned upon arrival to er.

## 2017-03-25 NOTE — ED Provider Notes (Signed)
[facility] DEPT Provider Note   CSN: [id number] Arrival date & time: 03/25/17  0027     History   Chief Complaint Chief Complaint  Patient presents with  . Chest Pain   Time seen 12:43 AM  Level V caveat for need for urgent intervention  HPI UNNAMED [name] Foster is a 61 y.o. male.  HPI a reports he started having chest pain a couple of hours ago. When asked to specify he states about 11 PM he was awakened from sleep with central chest pain that radiated up into his neck and down both his arms. He was advised to take 325 mg of aspirin by 911, and EMS gave him 1 sublingual nitroglycerin which improved his pain. Patient states he's never had this pain before. He complains of nausea now without vomiting, he also complains of shortness of breath. Patient reports he smokes 2 packs a day and he has a history of hypertension and diabetes. He has no prior cardiac history. He denies any family history of heart disease.   PCP [name], DO   Past Medical History:  Diagnosis Date  . Diabetes mellitus without complication ([place])   . High cholesterol   . Hyperlipidemia   . Hypertension   . Insomnia   . Sleep apnea    could not use a cpap  . Wears dentures    full top-partial bottom  . Wears glasses     Patient Active Problem List   Diagnosis Date Noted  . Ganglion cyst of wrist 07/25/2014  . Hypertension 12/01/2010  . Sleep apnea 12/01/2010  . Abnormal EKG 12/01/2010  . Hyperlipidemia 12/01/2010  . Shortness of breath 12/01/2010    Past Surgical History:  Procedure Laterality Date  . COLONOSCOPY    . GANGLION CYST EXCISION Left 07/25/2014   Procedure: EXCISION VOLAR AND DORSAL GANGLION LEFT WRIST;  Surgeon: Leanora Cover, MD;  Location: [facility];  Service: Orthopedics;  Laterality: Left;  . KNEE ARTHROSCOPY     rifgr and left  . SHOULDER ARTHROSCOPY  2012   left  . SHOULDER ARTHROSCOPY     right  . TONSILLECTOMY    . TOTAL KNEE ARTHROPLASTY  2002     left       Home Medications    Prior to Admission medications   Medication Sig Start Date End Date Taking? Authorizing Provider  amLODipine (NORVASC) 5 MG tablet Take 1 tablet by mouth daily.     [provider]  chlorthalidone (HYGROTON) 25 MG tablet TAKE 1 TABLET (25 MG TOTAL) BY MOUTH DAILY. 06/06/12   Serpe, Burna Forts, PA-C  glipiZIDE (GLUCOTROL) 5 MG tablet Take 5 mg by mouth daily before breakfast.    [provider]  HYDROcodone-acetaminophen (NORCO) 5-325 MG per tablet 1-2 tabs po q6 hours prn pain 07/25/14   Leanora Cover, MD  HYDROcodone-acetaminophen (NORCO/VICODIN) 5-325 MG per tablet Take 1 tablet by mouth every 6 (six) hours as needed for moderate pain.    [provider]  ramipril (ALTACE) 10 MG tablet Take 1 tablet by mouth daily.     [provider]  rosuvastatin (CRESTOR) 10 MG tablet Take 10 mg by mouth daily.      [provider]  zolpidem (AMBIEN) 10 MG tablet Take 1 tablet by mouth every night.     [provider]    Family History No family history on file.  Social History Social History  Substance Use Topics  . Smoking status: Current Every Day  Smoker    Packs/day: 1.00  . Smokeless tobacco: Never Used  . Alcohol use Yes     Comment: occ  smokes 2 ppd Lives with spouse  Allergies   Patient has no known allergies.   Review of Systems Review of Systems  All other systems reviewed and are negative.    Physical Exam Updated Vital Signs ED Triage Vitals [03/25/17 0034]  Enc Vitals Group     BP 120/78     Pulse Rate 74     Resp 20     Temp 98.2 F (36.8 C)     Temp Source Oral     SpO2 96 %     Weight 185 lb (83.9 kg)     Height 5\' 8"  (1.727 m)     Head Circumference      Peak Flow      Pain Score 8     Pain Loc      Pain Edu?      Excl. in [place]?    Vital signs normal    Physical Exam  Constitutional: He is oriented to person, place, and time. He appears well-developed and  well-nourished.  Non-toxic appearance. He does not appear ill. He appears distressed.  HENT:  Head: Normocephalic and atraumatic.  Right Ear: External ear normal.  Left Ear: External ear normal.  Nose: Nose normal. No mucosal edema or rhinorrhea.  Mouth/Throat: Oropharynx is clear and moist and mucous membranes are normal. No dental abscesses or uvula swelling.  Gray lips  Eyes: Pupils are equal, round, and reactive to light. Conjunctivae and EOM are normal.  Neck: Normal range of motion and full passive range of motion without pain. Neck supple.  Cardiovascular: Normal rate, regular rhythm and normal heart sounds.  Exam reveals no gallop and no friction rub.   No murmur heard. Pulmonary/Chest: Effort normal and breath sounds normal. No respiratory distress. He has no wheezes. He has no rhonchi. He has no rales. He exhibits no tenderness and no crepitus.  Abdominal: Soft. Normal appearance and bowel sounds are normal. He exhibits no distension. There is no tenderness. There is no rebound and no guarding.  Musculoskeletal: Normal range of motion. He exhibits no edema or tenderness.  Moves all extremities well.   Neurological: He is alert and oriented to person, place, and time. He has normal strength. No cranial nerve deficit.  Skin: Skin is warm, dry and intact. No rash noted. No erythema. There is pallor.  Psychiatric: He has a normal mood and affect. His speech is normal and behavior is normal. His mood appears not anxious.  Nursing note and vitals reviewed.    ED Treatments / Results  Labs (all labs ordered are listed, but only abnormal results are displayed) Results for orders placed or performed during the hospital encounter of 03/25/17  Comprehensive metabolic panel  Result Value Ref Range   Sodium 135 135 - 145 mmol/L   Potassium 3.0 (L) 3.5 - 5.1 mmol/L   Chloride 100 (L) 101 - 111 mmol/L   CO2 23 22 - 32 mmol/L   Glucose, Bld 211 (H) 65 - 99 mg/dL   BUN 15 6 - 20 mg/dL    Creatinine, Ser 1.26 (H) 0.61 - 1.24 mg/dL   Calcium 9.0 8.9 - 10.3 mg/dL   Total Protein 6.0 (L) 6.5 - 8.1 g/dL   Albumin 3.3 (L) 3.5 - 5.0 g/dL   AST 25 15 - 41 U/L   ALT 32 17 - 63 U/L  Alkaline Phosphatase 48 38 - 126 U/L   Total Bilirubin 0.6 0.3 - 1.2 mg/dL   GFR calc non Af Amer 59 (L) >60 mL/min   GFR calc Af Amer >60 >60 mL/min   Anion gap 12 5 - 15  CBC with Differential  Result Value Ref Range   WBC 7.6 4.0 - 10.5 K/uL   RBC 4.67 4.22 - 5.81 MIL/uL   Hemoglobin 14.7 13.0 - 17.0 g/dL   HCT 40.6 39.0 - 52.0 %   MCV 86.9 78.0 - 100.0 fL   MCH 31.5 26.0 - 34.0 pg   MCHC 36.2 (H) 30.0 - 36.0 g/dL   RDW 13.0 11.5 - 15.5 %   Platelets 178 150 - 400 K/uL   Neutrophils Relative % 46 %   Neutro Abs 3.5 1.7 - 7.7 K/uL   Lymphocytes Relative 45 %   Lymphs Abs 3.4 0.7 - 4.0 K/uL   Monocytes Relative 7 %   Monocytes Absolute 0.5 0.1 - 1.0 K/uL   Eosinophils Relative 2 %   Eosinophils Absolute 0.1 0.0 - 0.7 K/uL   Basophils Relative 0 %   Basophils Absolute 0.0 0.0 - 0.1 K/uL  Protime-INR  Result Value Ref Range   Prothrombin Time 12.8 11.4 - 15.2 seconds   INR 0.97   APTT  Result Value Ref Range   aPTT 24 24 - 36 seconds  Troponin I  Result Value Ref Range   Troponin I 0.07 (HH) <0.03 ng/mL   Laboratory interpretation all normal except + troponin, hypokalemia, renal insufficiency    EKG  EKG Interpretation  Date/Time:  Friday March 25 2017 00:33:37 EDT Ventricular Rate:  67 PR Interval:  170 QRS Duration: 88 QT Interval:  390 QTC Calculation: 412 R Axis:   38 Text Interpretation:  Sinus rhythm with marked sinus arrhythmia Low voltage QRS Cannot rule out Anterior infarct , age undetermined Since last tracing 23 Jul 2014 ST  elevated in Inferior leads Confirmed by Rolland Porter [phone]) on 03/25/2017 12:44:39 AM        EKG Interpretation  Date/Time:  Friday March 25 2017 00:52:53 EDT Ventricular Rate:  50 PR Interval:  170 QRS Duration: 108 QT  Interval:  479 QTC Calculation: 366 R Axis:   84 Text Interpretation:  Sinus rhythm Second deg AVB, Mobitz I (Wenckebach) Inferoposterior infarct, acute (RCA) Lateral infarct, acute Probable RV involvement, suggest recording right precordial leads Since last tracing of earlier today ACUTE INFERIORLATERAL MI Confirmed by Rolland Porter [phone]) on 03/25/2017 1:17:53 AM       EKG Interpretation  Date/Time:  Friday March 25 2017 01:22:11 EDT Ventricular Rate:  36 PR Interval:  170 QRS Duration: 100 QT Interval:  538 QTC Calculation: 417 R Axis:   94 Text Interpretation:  Since last tracing rate faster Third degree heart block Inferoposterior infarct, acute (RCA) Lateral infarct, acute Probable RV involvement, suggest recording right precordial leads Confirmed by Rolland Porter [phone]) on 03/25/2017 1:35:07 AM        Radiology Dg Chest Port 1 View  Result Date: 03/25/2017 CLINICAL DATA:  Awoke with chest pain. EXAM: PORTABLE CHEST 1 VIEW COMPARISON:  10/03/2015 FINDINGS: Low lung volumes limit assessment. Grossly unchanged heart size and mediastinal contours. There is bronchovascular crowding with bronchial thickening. No confluent airspace disease. No large pleural effusion or pneumothorax. Stable osseous structures. IMPRESSION: Low lung volumes with bronchovascular crowding and mild bronchial thickening. Electronically Signed   By: Jeb Levering M.D.   On: 03/25/2017 01:26  Procedures Procedures (including critical care time)  CRITICAL CARE Performed by: Monifa Blanchette L [place] Total critical care time: 45 minutes Critical care time was exclusive of separately billable procedures and treating other patients. Critical care was necessary to treat or prevent imminent or life-threatening deterioration. Critical care was time spent personally by me on the following activities: development of treatment plan with patient and/or surrogate as well as nursing, discussions with consultants, evaluation of  patient's response to treatment, examination of patient, obtaining history from patient or surrogate, ordering and performing treatments and interventions, ordering and review of laboratory studies, ordering and review of radiographic studies, pulse oximetry and re-evaluation of patient's condition.   Medications Ordered in ED Medications  nitroGLYCERIN (NITROSTAT) SL tablet 0.4 mg ( Sublingual MAR Hold 03/25/17 0143)  nitroGLYCERIN (NITROSTAT) 0.4 MG SL tablet (not administered)  0.9 %  sodium chloride infusion (not administered)  DOPamine (INTROPIN) 3.2-5 MG/ML-% infusion (not administered)  DOPamine (INTROPIN) 800 mg in dextrose 5 % 250 mL (3.2 mg/mL) infusion ( Intravenous MAR Hold 03/25/17 0143)  ondansetron (ZOFRAN) injection 4 mg (4 mg Intravenous Given 03/25/17 0112)  heparin injection 4,000 Units (4,000 Units Intravenous Given 03/25/17 0113)  atropine 1 MG/10ML injection 0.5 mg (0.5 mg Intravenous Given 03/25/17 0058)  atropine 1 MG/10ML injection 0.5 mg (0.5 mg Intravenous Given 03/25/17 0105)  atropine 1 MG/10ML injection 1 mg (1 mg Intravenous Given 03/25/17 0100)  sodium chloride 0.9 % bolus 1,000 mL (1,000 mLs Intravenous New Bag/Given 03/25/17 0100)  sodium chloride 0.9 % bolus 1,000 mL (1,000 mLs Intravenous New Bag/Given 03/25/17 0105)  heparin ADULT infusion 100 units/mL (25000 units/223mL sodium chloride 0.45%) (16 Units/kg/hr  83.9 kg Intravenous New Bag/Given 03/25/17 0112)  atropine 1 MG/10ML injection 1 mg (1 mg Intravenous Given 03/25/17 0115)     Initial Impression / Assessment and Plan / ED Course  I have reviewed the triage vital signs and the nursing notes.  Pertinent labs & imaging results that were available during my care of the patient were reviewed by me and considered in my medical decision making (see chart for details).  After patient arrival his vital signs were initially normal and his first EKG had the suggestion of some ST changes in the inferior leads.  However patient was placed in a room and when he was placed back on the monitor he said his pain was returning and the second EKG shows marked ST elevation in the inferolateral lateral leads. At this point to patient started becoming bradycardic and hypotensive. He also complained of a lot of nausea. He was given Zofran twice for nausea. I was hoping his bradycardia was from a vasovagal episode. However his bradycardia did not improve. He was given atropine a total of 3 times. However on his monitor he appeared to be having a third degree type rhythm with the P wave seeming to be independent of his QRS complexes. He was given IV fluid boluses however due to patient being very restless the boluses were hard to run in. He was given a heparin bolus and started on a heparin drip. He had an external pacemaker in place. At about 1 AM his blood pressure was 65/44 with heart rate 30, at 1:10 AM his blood pressure was 70/40 with heart rate 32. Due to him being unstable EMS wanted a nurse to do a right along, his RN was advised to start the dopamine if his blood pressure doesn't start to improve with the IV fluid boluses.  Wife is here  and was made aware of his diagnosis and need for transfer, she also was informed he was very  unstable.  1 AM patient discussed with Dr. Burt Knack, interventional cardiologist on-call  1:10 AM Dr. Irish Lack, called, states to start low-dose dopamine if he doesn't respond to the IV fluids.   EMS left around 01:30 AM  Final Clinical Impressions(s) / ED Diagnoses   Final diagnoses:  ST elevation myocardial infarction (STEMI), unspecified artery ([place])  Hypotension, unspecified hypotension type  Bradycardia    PLAN TRANSFER TO [facility]  Rolland Porter, MD, Barbette Or, MD 03/25/17 [phone]

## 2017-03-25 NOTE — Progress Notes (Signed)
Progress Note  Patient Name: [name] Foster Date of Encounter: 03/25/2017  Primary Cardiologist: New (cooper)  Subjective   Intubated, but now awake, able to follow commands.   Inpatient Medications    Scheduled Meds: . [START ON 03/26/2017] aspirin EC  81 mg Oral Daily  . atorvastatin  80 mg Oral q1800  . chlorhexidine gluconate (MEDLINE KIT)  15 mL Mouth Rinse BID  . heparin  5,000 Units Subcutaneous Q8H  . insulin aspart  2-6 Units Subcutaneous Q4H  . mouth rinse  15 mL Mouth Rinse 10 times per day  . nitroGLYCERIN      . pantoprazole (PROTONIX) IV  40 mg Intravenous Daily  . sodium chloride flush  3 mL Intravenous Q12H  . ticagrelor  90 mg Oral BID   Continuous Infusions: . sodium chloride 10 mL/hr at 03/25/17 0700  . sodium chloride 250 mL (03/25/17 0700)   PRN Meds: sodium chloride, acetaminophen, albuterol, nitroGLYCERIN, ondansetron (ZOFRAN) IV, sodium chloride flush   Vital Signs    Vitals:   03/25/17 0727 03/25/17 0800 03/25/17 0815 03/25/17 0900  BP:  109/82 (!) 120/92 (!) 143/99  Pulse:  68 83 81  Resp:  '20 14 12  ' Temp: 97.8 F (36.6 C)     TempSrc: Axillary     SpO2:  100% 96% 99%  Weight:      Height:        Intake/Output Summary (Last 24 hours) at 03/25/17 1015 Last data filed at 03/25/17 0843  Gross per 24 hour  Intake           431.38 ml  Output              450 ml  Net           -18.62 ml   Filed Weights   03/25/17 0034 03/25/17 0420  Weight: 185 lb (83.9 kg) 190 lb 14.7 oz (86.6 kg)    Telemetry    AFIB, now SR - Personally Reviewed  ECG    Evolving ST changes in inferolateral leads - Personally Reviewed  Physical Exam   General: Older W male intubated, but awake and follows commands. Head: Normocephalic, atraumatic.  Neck: Supple without bruits, JVD. Lungs:  Resp regular and unlabored, CTA. Heart: RRR, S1, S2, no S3, S4, or murmur; no rub. Abdomen: Soft, non-tender, non-distended with normoactive bowel sounds. No  hepatomegaly. No rebound/guarding. No obvious abdominal masses. Extremities: No clubbing, cyanosis, edema. Distal pedal pulses are 2+ bilaterally. Right femoral sheath in place/temp wire.  Neuro: Alert and oriented X 3. Moves all extremities spontaneously. Psych: Normal affect.  Labs    Chemistry  Recent Labs Lab 03/25/17 0059 03/25/17 0427  NA 135 135  K 3.0* 3.5  CL 100* 105  CO2 23 22  GLUCOSE 211* 275*  BUN 15 14  CREATININE 1.26* 1.24  CALCIUM 9.0 7.7*  PROT 6.0*  --   ALBUMIN 3.3*  --   AST 25  --   ALT 32  --   ALKPHOS 48  --   BILITOT 0.6  --   GFRNONAA 59* >60  GFRAA >60 >60  ANIONGAP 12 8     Hematology  Recent Labs Lab 03/25/17 0059 03/25/17 0427  WBC 7.6 9.6  RBC 4.67 4.56  HGB 14.7 13.9  HCT 40.6 40.3  MCV 86.9 88.4  MCH 31.5 30.5  MCHC 36.2* 34.5  RDW 13.0 13.2  PLT 178 147*    Cardiac Enzymes  Recent Labs Lab 03/25/17  0059 03/25/17 0427  TROPONINI 0.07* 26.90*   No results for input(s): TROPIPOC in the last 168 hours.   BNPNo results for input(s): BNP, PROBNP in the last 168 hours.   DDimer No results for input(s): DDIMER in the last 168 hours.    Radiology    Portable Chest Xray  Result Date: 03/25/2017 CLINICAL DATA:  61 year old male with respiratory failure status post endotracheal tube placement. EXAM: PORTABLE CHEST 1 VIEW COMPARISON:  Radiograph dated 03/25/2017 FINDINGS: An endotracheal tube is seen with tip approximately 2.5 cm above the carina. Recommend retraction by approximately 2- 3 cm for optimal positioning. And enteric tube with tip in the left upper abdomen noted. There is shallow inspiration increased interstitial and bronchovascular markings concerning for edema although atypical pneumonia is not excluded. There is no focal consolidation, pleural effusion, or pneumothorax. The cardiac silhouette is within normal limits. No acute osseous pathology. IMPRESSION: 1. Interval placement of an endotracheal and an enteric  tube. The endotracheal tube is approximately 2.5 cm above the carina. Recommend retraction by 2-3 cm for optimal positioning. The enteric tube terminates in the left upper abdomen. 2. Shallow inspiration with progression of interstitial and bronchovascular markings compared to the earlier radiograph. Electronically Signed   By: Anner Crete M.D.   On: 03/25/2017 04:45   Dg Chest Port 1 View  Result Date: 03/25/2017 CLINICAL DATA:  Awoke with chest pain. EXAM: PORTABLE CHEST 1 VIEW COMPARISON:  10/03/2015 FINDINGS: Low lung volumes limit assessment. Grossly unchanged heart size and mediastinal contours. There is bronchovascular crowding with bronchial thickening. No confluent airspace disease. No large pleural effusion or pneumothorax. Stable osseous structures. IMPRESSION: Low lung volumes with bronchovascular crowding and mild bronchial thickening. Electronically Signed   By: Jeb Levering M.D.   On: 03/25/2017 01:26    Cardiac Studies   LHC: 03/25/17  Conclusion   1. Acute inferoposterior STEMI secondary to thrombotic occlusion of the mid left circumflex 2. Moderate LAD and RCA stenosis 3. Elevated LVEDP consistent with acute systolic heart failure 4. Complete heart block treated with temporary transvenous pacemaker insertion 5. Successful primary PCI the left circumflex using a 3.5 x 24 mm Promus DES  Recommendations:  Angiomax should continue for 2 hours. The patient was loaded with brilinta 180 mg prior to intubation  Would leave femoral arterial and venous sheaths in place until tomorrow morning. If his heart rhythm is stable the temporary pacemaker and arterial sheath could be removed at that time  CCM is called to help manage the ventilator. Appreciate their help.   Diagnostic Diagram       Post-Intervention Diagram         Patient Profile     61 y.o. male with PMH of HTN, and HL who presented to [facility] with chest discomfort, called a Code STEMI 2/2 inferior ST  elevation and transferred to [facility] for emergent cardiac cath.   Assessment & Plan  Principal Problem:   STEMI involving left circumflex coronary artery (HCC) Active Problems:   Acute systolic heart failure (HCC)   Acute encephalopathy   Coronary artery disease involving native coronary artery of native heart with angina pectoris (HCC)   Presence of drug-eluting stent in left circumflex coronary artery   Dyslipidemia, goal LDL below 70   Acute hypoxemic respiratory failure (HCC)   Hypokalemia  - Potassium repleted   1. Acute inferoposterior MI: Underwent LHC with Dr. Burt Knack noted above with thrombotic occlusion of mid Lcx treated with Promus DES x1. Moderate LAD, RCA  disease noted, with elevated LVEDP. Temp pacemaker placed in the lab due to complete heart block. Continued on angiomax for 2 hours post cath. Trop 26.9, renal function stable. Blood pressure is stable. LVEDP 31, but looks volume stable on exam.  -- ASA, Brilinta, statin -- LV gram with severe hypokinesis of the inferior wall, unable to determine EF, echo pending  2. Acute Respiratory distress: Required intubated prior to PCI in the lab. CXR with no edema this morning. Awake and following commands.  -- hopefully can extubate today.   3. HL: LDL 92, trig 229  4. Complete Heart Block: Required temp pacing wire placement in the lab. Afib noted this morning, but currently in SR.  -- plan to remove temp wire today after extubation, along with femoral sheath. -- no BB at this time, if pressures remain stable consider ACEi/ARB tomorrow  5. Elevated CBGs: mostly 200s -- SSI -- hgb a1c pending  Signed, Nico Syme, NP-C 03/25/2017, 10:15 AM     I have seen, examined and evaluated the patient this AM along with Reino Bellis, NP.  After reviewing all the available data and chart, we discussed the patients laboratory, study & physical findings as well as symptoms in detail. I agree with her findings, examination as well as  impression recommendations as per our discussion.    Remains intubated this morning, but is pending extubation per [facility] M. Does not indicate that he is having any active anginal pain. Has not required any pacing over the last several hours. I suspect that we can pull his pacemaker following extubation. Once this is done and stable, can pull arterial and venous sheaths roughly 1 hour post.  No beta blocker for now obviously. With his blood pressure being somewhat elevated, would recommend ARB starting tomorrow. High-dose statin along with aspirin and Brilinta.  He will likely need sliding scale insulin for diabetes.  Slow progression based on presentation. -  Echo pending. Would not be unreasonable to wait another day to check. . If stable, anticipate potential transfer to telemetry versus stepdown tomorrow based on his acuity level. With reduced EF, would not consider fast-track.    Glenetta Hew, M.D., M.S. Interventional Cardiologist   Pager # [phone] Phone # [phone] [address] [place], [place] [address]

## 2017-03-25 NOTE — Transfer of Care (Signed)
Immediate Anesthesia Transfer of Care Note  Patient: [name] Foster  Procedure(s) Performed: Procedure(s): Left Heart Cath and Coronary Angiography (N/A) Coronary/Graft Acute MI Revascularization (N/A) Temporary Pacemaker (N/A)  Patient Location: Cath Lab  Anesthesia Type:General  Level of Consciousness: sedated  Airway & Oxygen Therapy: Patient remains intubated per anesthesia plan and Patient placed on Ventilator (see vital sign flow sheet for setting)  Post-op Assessment: Report given to RN and Post -op Vital signs reviewed and stable  Post vital signs: Reviewed and stable  Last Vitals:  Vitals:   03/25/17 0545 03/25/17 0600  BP:  (!) 117/97  Pulse: 77 75  Resp: 20 20  Temp:      Last Pain:  Vitals:   03/25/17 0300  TempSrc:   PainSc: 6          Complications: No apparent anesthesia complications

## 2017-03-26 DIAGNOSIS — I2121 ST elevation (STEMI) myocardial infarction involving left circumflex coronary artery: Principal | ICD-10-CM

## 2017-03-26 LAB — CBC
HCT: 39.2 % (ref 39.0–52.0)
HEMATOCRIT: 39.2 % (ref 39.0–52.0)
HEMOGLOBIN: 13.5 g/dL (ref 13.0–17.0)
Hemoglobin: 13.1 g/dL (ref 13.0–17.0)
MCH: 30.2 pg (ref 26.0–34.0)
MCH: 30.8 pg (ref 26.0–34.0)
MCHC: 33.4 g/dL (ref 30.0–36.0)
MCHC: 34.4 g/dL (ref 30.0–36.0)
MCV: 90.3 fL (ref 78.0–100.0)
MCV: 89.3 fL (ref 78.0–100.0)
PLATELETS: 150 10*3/uL (ref 150–400)
Platelets: 143 10*3/uL — ABNORMAL LOW (ref 150–400)
RBC: 4.34 MIL/uL (ref 4.22–5.81)
RBC: 4.39 MIL/uL (ref 4.22–5.81)
RDW: 13.3 % (ref 11.5–15.5)
RDW: 13.6 % (ref 11.5–15.5)
WBC: 8.8 10*3/uL (ref 4.0–10.5)
WBC: 7.1 10*3/uL (ref 4.0–10.5)

## 2017-03-26 LAB — GLUCOSE, CAPILLARY
GLUCOSE-CAPILLARY: 194 mg/dL — AB (ref 65–99)
GLUCOSE-CAPILLARY: 171 mg/dL — AB (ref 65–99)
Glucose-Capillary: 156 mg/dL — ABNORMAL HIGH (ref 65–99)
Glucose-Capillary: 138 mg/dL — ABNORMAL HIGH (ref 65–99)
Glucose-Capillary: 232 mg/dL — ABNORMAL HIGH (ref 65–99)

## 2017-03-26 LAB — BASIC METABOLIC PANEL
Anion gap: 7 (ref 5–15)
BUN: 9 mg/dL (ref 6–20)
CHLORIDE: 102 mmol/L (ref 101–111)
CO2: 27 mmol/L (ref 22–32)
CREATININE: 1 mg/dL (ref 0.61–1.24)
Calcium: 8.8 mg/dL — ABNORMAL LOW (ref 8.9–10.3)
GFR calc Af Amer: >60 mL/min (ref >60)
GFR calc non Af Amer: >60 mL/min (ref >60)
GLUCOSE: 118 mg/dL — AB (ref 65–99)
Potassium: 3.3 mmol/L — ABNORMAL LOW (ref 3.5–5.1)
SODIUM: 136 mmol/L (ref 135–145)

## 2017-03-26 LAB — HEMOGLOBIN A1C
Hgb A1c MFr Bld: 9.4 % — ABNORMAL HIGH (ref 4.8–5.6)
MEAN PLASMA GLUCOSE: 223 mg/dL

## 2017-03-26 LAB — PHOSPHORUS: Phosphorus: 2 mg/dL — ABNORMAL LOW (ref 2.5–4.6)

## 2017-03-26 LAB — MAGNESIUM: Magnesium: 1.6 mg/dL — ABNORMAL LOW (ref 1.7–2.4)

## 2017-03-26 MED ORDER — LOSARTAN POTASSIUM 25 MG PO TABS
25.0000 mg | ORAL_TABLET | Freq: Every day | ORAL | Status: DC
  Administered 2017-03-26 – 2017-03-27 (×2): 25 mg via ORAL
  Filled 2017-03-26 (×2): qty 1

## 2017-03-26 MED ORDER — POTASSIUM CHLORIDE 20 MEQ/15ML (10%) PO SOLN
40.0000 meq | Freq: Once | ORAL | Status: AC
  Administered 2017-03-26: 40 meq via ORAL
  Filled 2017-03-26: qty 30

## 2017-03-26 MED ORDER — MAGNESIUM SULFATE 2 GM/50ML IV SOLN
2.0000 g | Freq: Once | INTRAVENOUS | Status: AC
  Administered 2017-03-26: 2 g via INTRAVENOUS
  Filled 2017-03-26: qty 50

## 2017-03-26 MED ORDER — ZOLPIDEM TARTRATE 5 MG PO TABS
10.0000 mg | ORAL_TABLET | Freq: Every evening | ORAL | Status: DC | PRN
  Administered 2017-03-26: 10 mg via ORAL
  Filled 2017-03-26: qty 2

## 2017-03-26 NOTE — Progress Notes (Signed)
Progress Note  Patient Name: [name] Foster Date of Encounter: 03/26/2017  Primary Cardiologist: New (cooper)   Patient Profile     61 y.o. male with PMH of HTN, and HL who presented to [facility] with chest discomfort, called a Code STEMI 2/2 inferior ST elevation with complete heart block and shock. Tansferred to [facility] for emergent cardiac cath complicated by acute //pulm edema and respiratory failure Intubated acutely  7/13 extubated    Subjective   Without complaints  Walking  + smoker   Now Ex-smoker    Inpatient Medications    Scheduled Meds: . aspirin EC  81 mg Oral Daily  . atorvastatin  80 mg Oral q1800  . heparin  5,000 Units Subcutaneous Q8H  . insulin aspart  2-6 Units Subcutaneous Q4H  . mouth rinse  15 mL Mouth Rinse BID  . pantoprazole (PROTONIX) IV  40 mg Intravenous Daily  . sodium chloride flush  3 mL Intravenous Q12H  . ticagrelor  90 mg Oral BID   Continuous Infusions: . sodium chloride 10 mL/hr at 03/25/17 0700  . sodium chloride 250 mL (03/25/17 0700)   PRN Meds: sodium chloride, acetaminophen, albuterol, nitroGLYCERIN, ondansetron (ZOFRAN) IV, sodium chloride flush   Vital Signs    Vitals:   03/26/17 0600 03/26/17 0700 03/26/17 0800 03/26/17 0810  BP:  102/77 113/88   Pulse: 84 81 77   Resp: 14 16 15    Temp:    98.6 F (37 C)  TempSrc:      SpO2: 96% 97% 97%   Weight:      Height:        Intake/Output Summary (Last 24 hours) at 03/26/17 1043 Last data filed at 03/26/17 0944  Gross per 24 hour  Intake             1260 ml  Output             1405 ml  Net             -145 ml   Filed Weights   03/25/17 0034 03/25/17 0420 03/26/17 0500  Weight: 185 lb (83.9 kg) 190 lb 14.7 oz (86.6 kg) 188 lb 11.4 oz (85.6 kg)    Telemetry    nsr with some bradycardia  Personally Reviewed  ECG    Evolving ST changes in inferolateral leads - Personally Reviewed  Physical Exam   Well developed and nourished in no acute distress HENT normal Neck  supple with JVP-flat Carotids brisk and full without bruits Clear Regular rate and rhythm, no murmurs or gallops Abd-soft with active BS without hepatomegaly No Clubbing cyanosis edema Skin-warm and dry A & Oriented  Grossly normal sensory and motor function   Labs    Chemistry  Recent Labs Lab 03/25/17 0059 03/25/17 0427 03/26/17 0200  NA 135 135 136  K 3.0* 3.5 3.3*  CL 100* 105 102  CO2 23 22 27   GLUCOSE 211* 275* 118*  BUN 15 14 9   CREATININE 1.26* 1.24 1.00  CALCIUM 9.0 7.7* 8.8*  PROT 6.0*  --   --   ALBUMIN 3.3*  --   --   AST 25  --   --   ALT 32  --   --   ALKPHOS 48  --   --   BILITOT 0.6  --   --   GFRNONAA 59* >60 >60  GFRAA >60 >60 >60  ANIONGAP 12 8 7      Hematology  Recent Labs Lab 03/25/17  0059 03/25/17 0427 03/26/17 0200  WBC 7.6 9.6 8.8  RBC 4.67 4.56 4.34  HGB 14.7 13.9 13.1  HCT 40.6 40.3 39.2  MCV 86.9 88.4 90.3  MCH 31.5 30.5 30.2  MCHC 36.2* 34.5 33.4  RDW 13.0 13.2 13.3  PLT 178 147* 150    Cardiac Enzymes  Recent Labs Lab 03/25/17 0059 03/25/17 0427 03/25/17 1202 03/25/17 1539  TROPONINI 0.07* 26.90* >65.00* >65.00*   No results for input(s): TROPIPOC in the last 168 hours.   BNP  Recent Labs Lab 03/25/17 1202  BNP 132.8*     DDimer No results for input(s): DDIMER in the last 168 hours.    Radiology    Portable Chest Xray  Result Date: 03/25/2017 CLINICAL DATA:  61 year old male with respiratory failure status post endotracheal tube placement. EXAM: PORTABLE CHEST 1 VIEW COMPARISON:  Radiograph dated 03/25/2017 FINDINGS: An endotracheal tube is seen with tip approximately 2.5 cm above the carina. Recommend retraction by approximately 2- 3 cm for optimal positioning. And enteric tube with tip in the left upper abdomen noted. There is shallow inspiration increased interstitial and bronchovascular markings concerning for edema although atypical pneumonia is not excluded. There is no focal consolidation, pleural  effusion, or pneumothorax. The cardiac silhouette is within normal limits. No acute osseous pathology. IMPRESSION: 1. Interval placement of an endotracheal and an enteric tube. The endotracheal tube is approximately 2.5 cm above the carina. Recommend retraction by 2-3 cm for optimal positioning. The enteric tube terminates in the left upper abdomen. 2. Shallow inspiration with progression of interstitial and bronchovascular markings compared to the earlier radiograph. Electronically Signed   By: Anner Crete M.D.   On: 03/25/2017 04:45   Dg Chest Port 1 View  Result Date: 03/25/2017 CLINICAL DATA:  Awoke with chest pain. EXAM: PORTABLE CHEST 1 VIEW COMPARISON:  10/03/2015 FINDINGS: Low lung volumes limit assessment. Grossly unchanged heart size and mediastinal contours. There is bronchovascular crowding with bronchial thickening. No confluent airspace disease. No large pleural effusion or pneumothorax. Stable osseous structures. IMPRESSION: Low lung volumes with bronchovascular crowding and mild bronchial thickening. Electronically Signed   By: Jeb Levering M.D.   On: 03/25/2017 01:26    Cardiac Studies   LHC: 03/25/17  Conclusion   1. Acute inferoposterior STEMI secondary to thrombotic occlusion of the mid left circumflex 2. Moderate LAD and RCA stenosis 3. Elevated LVEDP consistent with acute systolic heart failure 4. Complete heart block treated with temporary transvenous pacemaker insertion 5. Successful primary PCI the left circumflex using a 3.5 x 24 mm Promus DES  Recommendations:  Angiomax should continue for 2 hours. The patient was loaded with brilinta 180 mg prior to intubation  Would leave femoral arterial and venous sheaths in place until tomorrow morning. If his heart rhythm is stable the temporary pacemaker and arterial sheath could be removed at that time  CCM is called to help manage the ventilator. Appreciate their help.   Diagnostic Diagram         Post-Intervention Diagram         ECHO  EF 60-65%   Assessment & Plan  Principal Problem:   STEMI involving left circumflex coronary artery (HCC) Active Problems:   Dyslipidemia, goal LDL below 70   Acute systolic heart failure (HCC)   Acute hypoxemic respiratory failure (HCC)   Hypokalemia   Acute encephalopathy   Coronary artery disease involving native coronary artery of native heart with angina pectoris (HCC)   Presence of drug-eluting stent in left  circumflex coronary artery  - Potassium repleted   1. Acute inferoposterior MI: >>>thrombotic occlusion of mid Lcx treated with Promus DES x1. Moderate LAD, RCA disease noted, with elevated LVEDP. Temp pacemaker placed in the lab due to complete heart block. Continued on angiomax for 2 hours post cath. Trop 26.9, renal function stable.    ASA, Brilinta, statin -- LV gram with severe hypokinesis of the inferior wall,>>Echo Normal EF   HL: LDL 92, trig 229   Complete Heart Block:  Hypokalemia- repleted     Diabetes  Diabetes coordinator consult    Continue current meds Transfer to step down  Anticipate discharge in am

## 2017-03-26 NOTE — Progress Notes (Signed)
CARDIAC REHAB PHASE I   PRE:  Rate/Rhythm: 88 SR  BP:  Supine:   Sitting: 130/78  Standing:   SaO2: 98%RA  MODE:  Ambulation: 370 ft   POST:  Rate/Rhythm: 100 SR  BP:  Supine:   Sitting: 137/85  Standing:    SaO2: 98%RA 1032-1120 Pt walked 370 ft with steady gait. No CP. Tolerated well. MI education completed with pt who voiced understanding. Stressed importance of brilinta with stent. 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 Reviewed NTG use, risk factors, smoking cessation, MI restrictions, ex ed, carb counting, and heart healthy diet. Discussed CRP 2 and referring to [place]. Pt plans to quit smoking by the use of nicorette lozenges or gum. Gave smoking cessation handout and knows to call 1800quitnow if needed.   Graylon Good, RN BSN  03/26/2017 11:15 AM

## 2017-03-26 NOTE — Progress Notes (Signed)
PULMONARY / CRITICAL CARE MEDICINE   Name: [name] Foster MRN: [id number] DOB: [birth year]    ADMISSION DATE:  03/25/2017 CONSULTATION DATE:  03/25/17  REFERRING MD:  Irish Lack  CHIEF COMPLAINT:  STEMI  SUBJECTIVE:   Afebrile over night No leukocytosis  Fluid balance negative C/o back and abd soreness VITAL SIGNS: BP 102/77   Pulse 81   Temp 99.1 F (37.3 C) (Oral)   Resp 16   Ht 5\' 8"  (1.727 m)   Wt 188 lb 11.4 oz (85.6 kg)   SpO2 97%   BMI 28.69 kg/m  Oxygen--> room air and ambulating  HEMODYNAMICS:    VENTILATOR SETTINGS: Vent Mode: PSV;CPAP FiO2 (%):  [40 %] 40 % PEEP:  [5 cmH20] 5 cmH20 Pressure Support:  [5 cmH20] 5 cmH20  INTAKE / OUTPUT:  Intake/Output Summary (Last 24 hours) at 03/26/17 0757 Last data filed at 03/26/17 7412  Gross per 24 hour  Intake             1095 ml  Output             1515 ml  Net             -420 ml     PHYSICAL EXAMINATION: Physical Exam  Constitutional: He is oriented to person, place, and time. He appears well-developed and well-nourished.  HENT:  Head: Normocephalic and atraumatic.  Mouth/Throat: No oropharyngeal exudate.  Eyes: Pupils are equal, round, and reactive to light. Conjunctivae and EOM are normal.  Neck: Normal range of motion. No JVD present.  Cardiovascular: Normal rate.   Pulmonary/Chest: Effort normal and breath sounds normal.  Abdominal: Soft. Bowel sounds are normal.  Musculoskeletal: Normal range of motion. He exhibits no edema.  Neurological: He is alert and oriented to person, place, and time.  Skin: Skin is warm and dry.    LABS:  BMET  Recent Labs Lab 03/25/17 0059 03/25/17 0427 03/26/17 0200  NA 135 135 136  K 3.0* 3.5 3.3*  CL 100* 105 102  CO2 23 22 27   BUN 15 14 9   CREATININE 1.26* 1.24 1.00  GLUCOSE 211* 275* 118*    Electrolytes  Recent Labs Lab 03/25/17 0059 03/25/17 0427 03/26/17 0200  CALCIUM 9.0 7.7* 8.8*  MG  --   --  1.6*  PHOS  --   --  2.0*     CBC  Recent Labs Lab 03/25/17 0059 03/25/17 0427 03/26/17 0200  WBC 7.6 9.6 8.8  HGB 14.7 13.9 13.1  HCT 40.6 40.3 39.2  PLT 178 147* 150    Coag's  Recent Labs Lab 03/25/17 0059  APTT 24  INR 0.97    Sepsis Markers No results for input(s): LATICACIDVEN, PROCALCITON, O2SATVEN in the last 168 hours.  ABG  Recent Labs Lab 03/25/17 0401  PHART 7.206*  PCO2ART 53.8*  PO2ART 169.0*    Liver Enzymes  Recent Labs Lab 03/25/17 0059  AST 25  ALT 32  ALKPHOS 48  BILITOT 0.6  ALBUMIN 3.3*    Cardiac Enzymes  Recent Labs Lab 03/25/17 0427 03/25/17 1202 03/25/17 1539  TROPONINI 26.90* >65.00* >65.00*    Glucose  Recent Labs Lab 03/25/17 1201 03/25/17 1524 03/25/17 1925 03/25/17 2354 03/26/17 0400 03/26/17 0719  GLUCAP 142* 156* 192* 171* 194* 156*    Imaging No results found. STUDIES:  CXR 7/13 > low volumes, mild bronchial thickening.  CULTURES: None.  ANTIBIOTICS: None.  SIGNIFICANT EVENTS: 7/13 > admit.  LINES/TUBES: ETT 7/13>>>7/13 R femoral venous pacer 7/13>>>  DISCUSSION: 61 y.o. male admitted 7/13 with inferior STEMI.  Taken to cath lab where he had DES placed to circumflex along with temp venous pacer.  He was intubated during the procedure and PCCM was asked to assist with vent management. ->extubated 7/13  ASSESSMENT / PLAN:   Inferior STEMI - s/p cath with DES placed to Circumflex. CHB - s/p temp venous pacer 7/13. Hx HTN, HLD. Plan Cont tele monitoring  Cont EC ASA & Brilinta Cont lipitor  Defer additional rx to cards.    Respiratory insufficiency - s/p intubation by anesthesia during cardiac cath. Hx OSA. -Extubated 7/13.  No cxr on 7/14 but 7/13 cxr personally reviewed shows low volume film, no apparent infiltrates or -edema. Support tubes/lines in satisfactory position  Plan  Ambulate pulm hygiene   AKI resolved Fluid and electrolyte imbalance-->addressed by primary team Plan Trend  lytes Replace as indicated.   Hx DM w/ hyperglycemia  Plan   ssi protocol   Abd/back soreness ? Suspect just MSK  Plan Cbc today just to be sure no bleeding or hematoma causing this (doubt)   We will s/o  Erick Colace ACNP-BC [place] Pager # [phone] OR # [phone] if no answer

## 2017-03-26 NOTE — Progress Notes (Signed)
Cards fellow text-paged regarding AM labs: potassium 3.3, magnesium 1.6. Awaiting response to see if they want to replace it now.

## 2017-03-26 NOTE — Progress Notes (Signed)
Patient asking for home dosing of Ambien to be restarted : discussed with Dr Radford Pax : Verbal order to restart tonight

## 2017-03-27 ENCOUNTER — Encounter (HOSPITAL_COMMUNITY): Payer: Self-pay | Admitting: Physician Assistant

## 2017-03-27 ENCOUNTER — Other Ambulatory Visit: Payer: Self-pay | Admitting: Physician Assistant

## 2017-03-27 DIAGNOSIS — R809 Proteinuria, unspecified: Secondary | ICD-10-CM

## 2017-03-27 DIAGNOSIS — E1129 Type 2 diabetes mellitus with other diabetic kidney complication: Secondary | ICD-10-CM

## 2017-03-27 DIAGNOSIS — E876 Hypokalemia: Secondary | ICD-10-CM

## 2017-03-27 DIAGNOSIS — E119 Type 2 diabetes mellitus without complications: Secondary | ICD-10-CM

## 2017-03-27 DIAGNOSIS — E118 Type 2 diabetes mellitus with unspecified complications: Secondary | ICD-10-CM

## 2017-03-27 LAB — BASIC METABOLIC PANEL
ANION GAP: 10 (ref 5–15)
BUN: 9 mg/dL (ref 6–20)
CO2: 25 mmol/L (ref 22–32)
Calcium: 9.1 mg/dL (ref 8.9–10.3)
Chloride: 99 mmol/L — ABNORMAL LOW (ref 101–111)
Creatinine, Ser: 1.05 mg/dL (ref 0.61–1.24)
GFR calc Af Amer: >60 mL/min (ref >60)
GLUCOSE: 175 mg/dL — AB (ref 65–99)
POTASSIUM: 3.4 mmol/L — AB (ref 3.5–5.1)
Sodium: 134 mmol/L — ABNORMAL LOW (ref 135–145)

## 2017-03-27 LAB — GLUCOSE, CAPILLARY
Glucose-Capillary: 105 mg/dL — ABNORMAL HIGH (ref 65–99)
Glucose-Capillary: 115 mg/dL — ABNORMAL HIGH (ref 65–99)

## 2017-03-27 MED ORDER — ASPIRIN 81 MG PO TBEC: 81.0000 mg | DELAYED_RELEASE_TABLET | Freq: Every day | ORAL | 11 refills | Status: DC

## 2017-03-27 MED ORDER — METOPROLOL TARTRATE 25 MG PO TABS: 25.0000 mg | ORAL_TABLET | Freq: Two times a day (BID) | ORAL | 11 refills | Status: DC

## 2017-03-27 MED ORDER — METFORMIN HCL 500 MG PO TABS: 500.0000 mg | ORAL_TABLET | Freq: Every day | ORAL | 2 refills | Status: DC

## 2017-03-27 MED ORDER — METFORMIN HCL 500 MG PO TABS: 500.0000 mg | ORAL_TABLET | Freq: Two times a day (BID) | ORAL | 2 refills | Status: DC

## 2017-03-27 MED ORDER — METOPROLOL TARTRATE 25 MG PO TABS
25.0000 mg | ORAL_TABLET | Freq: Two times a day (BID) | ORAL | Status: DC
  Administered 2017-03-27: 25 mg via ORAL
  Filled 2017-03-27: qty 1

## 2017-03-27 MED ORDER — INSULIN ASPART 100 UNIT/ML ~~LOC~~ SOLN: 2.0000 [IU] | SUBCUTANEOUS | 11 refills | Status: DC

## 2017-03-27 MED ORDER — TICAGRELOR 90 MG PO TABS: 90.0000 mg | ORAL_TABLET | Freq: Two times a day (BID) | ORAL | 0 refills | Status: DC

## 2017-03-27 MED ORDER — POTASSIUM CHLORIDE CRYS ER 20 MEQ PO TBCR
60.0000 meq | EXTENDED_RELEASE_TABLET | Freq: Once | ORAL | Status: AC
  Administered 2017-03-27: 60 meq via ORAL
  Filled 2017-03-27: qty 3

## 2017-03-27 MED ORDER — ATORVASTATIN CALCIUM 80 MG PO TABS: 80.0000 mg | ORAL_TABLET | Freq: Every day | ORAL | 11 refills | Status: DC

## 2017-03-27 MED ORDER — TICAGRELOR 90 MG PO TABS: 90.0000 mg | ORAL_TABLET | Freq: Two times a day (BID) | ORAL | 11 refills | Status: DC

## 2017-03-27 MED ORDER — GLIPIZIDE 5 MG PO TABS: 2.5000 mg | ORAL_TABLET | Freq: Two times a day (BID) | ORAL | 1 refills | Status: DC

## 2017-03-27 MED ORDER — NITROGLYCERIN 0.4 MG SL SUBL: 0.4000 mg | SUBLINGUAL_TABLET | SUBLINGUAL | 11 refills | Status: DC | PRN

## 2017-03-27 MED ORDER — LOSARTAN POTASSIUM 25 MG PO TABS: 25.0000 mg | ORAL_TABLET | Freq: Every day | ORAL | 11 refills | Status: DC

## 2017-03-27 NOTE — Progress Notes (Addendum)
Inpatient Diabetes Program Recommendations  AACE/ADA: New Consensus Statement on Inpatient Glycemic Control (2015)  Target Ranges:  Prepandial:   less than 140 mg/dL      Peak postprandial:   less than 180 mg/dL (1-2 hours)      Critically ill patients:  140 - 180 mg/dL   Lab Results  Component Value Date   GLUCAP 115 (H) 03/27/2017   HGBA1C 9.4 (H) 03/25/2017   Review of Glycemic Control  Diabetes history: DM 2 Outpatient Diabetes Medications: Metformin 500 mg Daily Current orders for Inpatient glycemic control: Novolog 2-6 units Q4 hours  Consult for DM and elevated A1c level Spoke with patient about diabetes and home regimen for diabetes control. Patient reports that he is followed by his PCP [name] in [facility] for diabetes management. Patient states that he checks his glucose once a day first thing in the morning. He does not check any other times because he does not understand the numbers. Patient reports glucose levels from 150-200 level. Discussed A1C results (9.4 % on 03/25/17). Discussed glucose and A1C goals. Patient works swing shift and was 2 weeks away from retiring. It was difficult for him to check his glucose and eat correctly with work. Discussed importance of checking CBGs and maintaining good CBG control to prevent long-term and short-term complications. Explained how hyperglycemia leads to damage within blood vessels which lead to the common complications seen with uncontrolled diabetes. Discussed impact of nutrition, exercise, stress, sickness, and medications on diabetes control. Discussed portion sizes and beverage options. Encouraged patient to follow up with PCP for tighter control of glucose levels. Informed patient of the DM classes at [facility] and for a referral for Outpatient DM education. Patient verbalized understanding of information discussed and he states that he has no further questions at this time related to  diabetes.  Thanks,  Tama Headings RN, MSN, [facility] Inpatient Diabetes Coordinator Team Pager [phone] (8a-5p)

## 2017-03-27 NOTE — Discharge Summary (Signed)
Discharge Summary    Patient ID: [name] Foster  MRN: [id number], DOB/AGE: 62/26/1957 62 y.o.  Admit Date: 03/25/2017 Discharge Date: 03/27/2017  Primary Care Provider: [name], DO Primary Cardiologist: New to [facility] - H&P by Dr. Burt Knack, MD  Discharge Diagnoses    Principal Problem:   STEMI involving left circumflex coronary artery [facility]) Active Problems:   Dyslipidemia, goal LDL below 70   Acute systolic heart failure ([place])   Acute hypoxemic respiratory failure ([place])   Hypokalemia   Acute encephalopathy   Coronary artery disease involving native coronary artery of native heart with angina pectoris (HCC)   Presence of drug-eluting stent in left circumflex coronary artery   Type 2 diabetes mellitus with complication (HCC)   Allergies No Known Allergies   History of Present Illness     61 year old male with history of HTN and HLD who presented to [facility] with inferoposterior ST elevation MI on 03/25/2017 s/p emergent cardiac cath with thrombotic occlusion of the mid LCx s/p PCI/DES, complicated by acute encephalopathy requiring short-term mechanical ventilation to protect his airway.   Patient presented to [name] ED at 12:37 AM on 7/13 with chest pain that began around 11 PM on 7/12. Initial EKG showed some slight inferior ST changes. In the ED his pain got worse and had an EKG with marked ST elevation along the inferolateral leads. He was also in complete heart block with cardiogenic shock. He was briefly externally paced and given several doses of atropine, though he remained bradycardic. Due to the acute inferior MI, he was transferred to [facility] for emergent cardiac cath. In transport, he was given dopamine to help with his hypotension and bradycardia.   Hospital Course     Consultants: diabetes PCCM, coordinator, cardiac rehab   Upon arrival to [facility], he was taken directly to the cardiac cath lab in the setting of an acute inferoposterior STEMI  with complete heart block. Emergent cardiac cath on 03/25/2017 showed thrombotic occlusion of the mid LCx with successful PCI/DES of the LCx using a 3.5 x 24 mm Promus DES. There was also moderate LAD and RCA stenosis as detailed below. The complete heart block was treated with transvenous temporary pacemaker. LVEDP was elevated consistent with acute systolic CHF. He did require intubation to protect his airy on 7/13 and was extubated on 7/14. He was continued on Angiomax for 2 hours post cath and loaded with Brilinta 180 mg prior to intubation. Troponin peaked at 26.9. PCCM was consulted to assist with ventilator management. TTE showed EF 60-65%, normal wall motion, RV cavity size, wall thickness, and systolic function was normal, PASP 39 mmHg, trivial pericardial effusion was noted. Transient Afib was noted on telemetry later in the morning of 7/13, though this was brief and he spontaneously converted to sinus rhythm. Temp wire was removed along with femoral sheath on the morning of 7/13. Blood glucose was elevated into the 200s requiring SSI. On 7/14 he was up walking around without complaints. His hypokalemia and hypomagnesemia were repleted. He worked with cardiac rehab. He had not further chest pain or SOB. He was started on losartan and low dose metoprolol with stable BP and heart rate. He reported he was now an ex-smoker as of this admission. His chorthalidone, ramipril, and amlodipine were stopped at discharge as he was started on losartan and metoprolol this admission. He will resume metformin on 03/28/2017.   The patient's right femoral cath site has been examined is healing  well without issues at this time. The patient has been seen by Dr. Caryl Comes, MD and felt to be stable for discharge today. All follow up appointments have been made. Discharge medications are listed below. Prescriptions have been reviewed with the patient and sent in to their pharmacy.  _____________  Discharge Vitals Blood pressure  119/83, pulse 89, temperature 98.5 F (36.9 C), temperature source Oral, resp. rate 16, height 5\' 8"  (1.727 m), weight 186 lb 8 oz (84.6 kg), SpO2 96 %.  Filed Weights   03/25/17 0420 03/26/17 0500 03/27/17 0500  Weight: 190 lb 14.7 oz (86.6 kg) 188 lb 11.4 oz (85.6 kg) 186 lb 8 oz (84.6 kg)    Labs & Radiologic Studies    CBC  Recent Labs  03/25/17 0059  03/26/17 0200 03/26/17 1352  WBC 7.6  < > 8.8 7.1  NEUTROABS 3.5  --   --   --   HGB 14.7  < > 13.1 13.5  HCT 40.6  < > 39.2 39.2  MCV 86.9  < > 90.3 89.3  PLT 178  < > 150 143*  < > = values in this interval not displayed. Basic Metabolic Panel  Recent Labs  03/26/17 0200 03/27/17 0924  NA 136 134*  K 3.3* 3.4*  CL 102 99*  CO2 27 25  GLUCOSE 118* 175*  BUN 9 9  CREATININE 1.00 1.05  CALCIUM 8.8* 9.1  MG 1.6*  --   PHOS 2.0*  --    Liver Function Tests  Recent Labs  03/25/17 0059  AST 25  ALT 32  ALKPHOS 48  BILITOT 0.6  PROT 6.0*  ALBUMIN 3.3*   No results for input(s): LIPASE, AMYLASE in the last 72 hours. Cardiac Enzymes  Recent Labs  03/25/17 0427 03/25/17 1202 03/25/17 1539  TROPONINI 26.90* >65.00* >65.00*   BNP Invalid input(s): POCBNP D-Dimer No results for input(s): DDIMER in the last 72 hours. Hemoglobin A1C  Recent Labs  03/25/17 0427  HGBA1C 9.4*   Fasting Lipid Panel  Recent Labs  03/25/17 0427  CHOL 163  HDL 25*  LDLCALC 92  TRIG 229*  CHOLHDL 6.5   Thyroid Function Tests No results for input(s): TSH, T4TOTAL, T3FREE, THYROIDAB in the last 72 hours.  Invalid input(s): FREET3 _____________  Portable Chest Xray  Result Date: 03/25/2017 IMPRESSION: 1. Interval placement of an endotracheal and an enteric tube. The endotracheal tube is approximately 2.5 cm above the carina. Recommend retraction by 2-3 cm for optimal positioning. The enteric tube terminates in the left upper abdomen. 2. Shallow inspiration with progression of interstitial and bronchovascular markings  compared to the earlier radiograph. Electronically Signed   By: Anner Crete M.D.   On: 03/25/2017 04:45   Dg Chest Port 1 View  Result Date: 03/25/2017 IMPRESSION: Low lung volumes with bronchovascular crowding and mild bronchial thickening. Electronically Signed   By: Jeb Levering M.D.   On: 03/25/2017 01:26    Diagnostic Studies/Procedures   LHC 03/25/2017: Coronary Findings   Dominance: Co-dominant  Left Main  Left main is calcified but patent without significant stenosis.  Left Anterior Descending  Prox LAD lesion, 50% stenosed. The lesion is mildly calcified.  Left Circumflex  Mid Cx to Dist Cx lesion, 99% stenosed. TIMI-1 flow at baseline  Angioplasty: Lesion crossed with guidewire using a WIRE COUGAR XT STRL 190CM. A STENT PROMUS PREM MR 3.5X24 drug eluting stent was successfully placed. Post-stent angioplasty was not performed. The pre-interventional distal flow is decreased (TIMI 1).  The post-interventional distal flow is normal (TIMI 3). The intervention was successful . No complications occurred at this lesion. A cougar wire was used to cross the lesion. The lesion is predilated with a 2.5 mm balloon, stented with a 3.5 x 24 mm Promus DES. Following stenting, there is no-reflow. Intracoronary verapamil is administered and the flow is improved to TIMI 3.  There is no residual stenosis post intervention.  First Obtuse Marginal Branch  Ost 1st Mrg lesion, 50% stenosed. Ostial lesion  Right Coronary Artery  Relatively small vessel, mild calcification in the right coronary cusp.  Mid RCA lesion, 50% stenosed.  Wall Motion              Left Heart   Left Ventricle There are LV function abnormalities due to segmental dysfunction. There is severe hypokinesis of the inferior wall. The remaining LV wall segments are not well visualized. The LVEF cannot be determined on the basis of this study.    Coronary Diagrams   Diagnostic Diagram       Post-Intervention Diagram         Conclusion   1. Acute inferoposterior STEMI secondary to thrombotic occlusion of the mid left circumflex 2. Moderate LAD and RCA stenosis 3. Elevated LVEDP consistent with acute systolic heart failure 4. Complete heart block treated with temporary transvenous pacemaker insertion 5. Successful primary PCI the left circumflex using a 3.5 x 24 mm Promus DES  Recommendations:  Angiomax should continue for 2 hours. The patient was loaded with brilinta 180 mg prior to intubation  Would leave femoral arterial and venous sheaths in place until tomorrow morning. If his heart rhythm is stable the temporary pacemaker and arterial sheath could be removed at that time  CCM is called to help manage the ventilator. Appreciate their help.   TTE 03/25/2017: Study Conclusions  - Left ventricle: The cavity size was normal. Systolic function was   normal. The estimated ejection fraction was in the range of 60%   to 65%. Wall motion was normal; there were no regional wall   motion abnormalities. Left ventricular diastolic function   parameters were normal. - Aortic valve: Transvalvular velocity was within the normal range.   There was no stenosis. There was trivial regurgitation. - Mitral valve: Transvalvular velocity was within the normal range.   There was no evidence for stenosis. There was no regurgitation. - Right ventricle: The cavity size was normal. Wall thickness was   normal. Systolic function was normal. - Tricuspid valve: There was trivial regurgitation. - Pulmonary arteries: Systolic pressure was within the normal   range. PA peak pressure: 39 mm Hg (S). - Pericardium, extracardiac: A trivial pericardial effusion was   identified. _____________  Disposition   Pt is being discharged home today in good condition.  Follow-up Plans & Appointments    Follow-up Information    [facility] Follow up.   Why:  A message has been  sent to our office for follow up within 1 week. If the patient has not heard from our office by 7/19, he is advised to call for follow up appointment.  Contact information: [place] [address] [phone]         Discharge Instructions    AMB Referral to Phase II Cardiac Rehab    Complete by:  As directed    Diagnosis:  STEMI   Amb Referral to Cardiac Rehabilitation    Complete by:  As directed  Diagnosis:   STEMI Coronary Stents     Ambulatory referral to Nutrition and Diabetic Education    Complete by:  As directed    Patient lives near [place], [place] and will need Education through [employer]. CAD with uncontrolled DM A1c 9.4%   Call MD for:  difficulty breathing, headache or visual disturbances    Complete by:  As directed    Call MD for:  extreme fatigue    Complete by:  As directed    Call MD for:  hives    Complete by:  As directed    Call MD for:  persistant dizziness or light-headedness    Complete by:  As directed    Call MD for:  persistant nausea and vomiting    Complete by:  As directed    Call MD for:  redness, tenderness, or signs of infection (pain, swelling, redness, odor or green/yellow discharge around incision site)    Complete by:  As directed    Call MD for:  severe uncontrolled pain    Complete by:  As directed    Call MD for:  temperature >100.4    Complete by:  As directed    Diet - low sodium heart healthy    Complete by:  As directed    Increase activity slowly    Complete by:  As directed       Discharge Medications       Medication List    STOP taking these medications   amLODipine 5 MG tablet Commonly known as:  NORVASC   chlorthalidone 25 MG tablet Commonly known as:  HYGROTON   ibuprofen 200 MG tablet Commonly known as:  ADVIL,MOTRIN   ramipril 10 MG tablet Commonly known as:  ALTACE     TAKE these medications   AMBIEN 10 MG tablet Generic drug:  zolpidem Take 10 mg by  mouth at bedtime as needed for sleep.   Armodafinil 250 MG tablet Take 250 mg by mouth daily.   aspirin 81 MG EC tablet Take 1 tablet (81 mg total) by mouth daily.   atorvastatin 80 MG tablet Commonly known as:  LIPITOR Take 1 tablet (80 mg total) by mouth daily at 6 PM.   gabapentin 300 MG capsule Commonly known as:  NEURONTIN Take 300 mg by mouth 3 (three) times daily.   glipiZIDE 5 MG tablet Commonly known as:  GLUCOTROL Take 0.5 tablets (2.5 mg total) by mouth 2 (two) times daily before a meal.   HYDROcodone-acetaminophen 7.5-325 MG tablet Commonly known as:  NORCO Take 1 tablet by mouth every 6 (six) hours as needed for moderate pain.   losartan 25 MG tablet Commonly known as:  COZAAR Take 1 tablet (25 mg total) by mouth daily.   metFORMIN 500 MG tablet Commonly known as:  GLUCOPHAGE Take 1 tablet (500 mg total) by mouth 2 (two) times daily with a meal. May resume metformin on 03/28/2017 What changed:  when to take this  additional instructions   metoprolol tartrate 25 MG tablet Commonly known as:  LOPRESSOR Take 1 tablet (25 mg total) by mouth 2 (two) times daily.   nitroGLYCERIN 0.4 MG SL tablet Commonly known as:  NITROSTAT Place 1 tablet (0.4 mg total) under the tongue every 5 (five) minutes x 3 doses as needed for chest pain.   testosterone cypionate 200 MG/ML injection Commonly known as:  DEPOTESTOSTERONE CYPIONATE Inject 200 mg into the muscle every 14 (fourteen) days.   ticagrelor 90 MG Tabs tablet  Commonly known as:  BRILINTA Take 1 tablet (90 mg total) by mouth 2 (two) times daily.   ticagrelor 90 MG Tabs tablet Commonly known as:  BRILINTA Take 1 tablet (90 mg total) by mouth 2 (two) times daily.        Aspirin prescribed at discharge?  Yes High Intensity Statin Prescribed? (Lipitor 40-80mg  or Crestor 20-40mg ): Yes Beta Blocker Prescribed? Yes For EF <40%, was ACEI/ARB Prescribed? No: EF > 40% ADP Receptor Inhibitor Prescribed? (i.e.  Plavix etc.-Includes Medically Managed Patients): Yes For EF <40%, Aldosterone Inhibitor Prescribed? No: EF > 40% Was EF assessed during THIS hospitalization? Yes Was Cardiac Rehab II ordered? (Included Medically managed Patients): Yes   Outstanding Labs/Studies   None.  Duration of Discharge Encounter   Greater than 30 minutes including physician time.  Signed, Rise Mu, PA-C [place] Pager: [phone] 03/27/2017, 11:25 AM

## 2017-03-27 NOTE — Progress Notes (Signed)
Progress Note  Patient Name: SAMPSON SELF Date of Encounter: 03/27/2017  Primary Cardiologist: New ([place])   Patient Profile     61 y.o. male with PMH of HTN, and HL who presented to [facility] with chest discomfort, called a Code STEMI 2/2 inferior ST elevation with complete heart block and shock. Tansferred to [facility] for emergent cardiac cath complicated by acute //pulm edema and respiratory failure Intubated acutely  7/13 extubated    Subjective  Without complaint  NO SOB or Chest pain,  Ambulating without difficulty Inpatient Medications    Scheduled Meds: . aspirin EC  81 mg Oral Daily  . atorvastatin  80 mg Oral q1800  . heparin  5,000 Units Subcutaneous Q8H  . insulin aspart  2-6 Units Subcutaneous Q4H  . losartan  25 mg Oral Daily  . mouth rinse  15 mL Mouth Rinse BID  . pantoprazole (PROTONIX) IV  40 mg Intravenous Daily  . sodium chloride flush  3 mL Intravenous Q12H  . ticagrelor  90 mg Oral BID   Continuous Infusions: . sodium chloride 10 mL/hr at 03/25/17 0700  . sodium chloride 250 mL (03/25/17 0700)   PRN Meds: sodium chloride, acetaminophen, albuterol, nitroGLYCERIN, ondansetron (ZOFRAN) IV, sodium chloride flush, zolpidem   Vital Signs    Vitals:   03/27/17 0300 03/27/17 0400 03/27/17 0500 03/27/17 0740  BP:  118/89  119/83  Pulse:    82  Resp:    14  Temp: 98.8 F (37.1 C)   98.5 F (36.9 C)  TempSrc: Oral   Oral  SpO2:    95%  Weight:   186 lb 8 oz (84.6 kg)   Height:        Intake/Output Summary (Last 24 hours) at 03/27/17 0756 Last data filed at 03/27/17 0600  Gross per 24 hour  Intake              360 ml  Output             1400 ml  Net            -1040 ml   Filed Weights   03/25/17 0420 03/26/17 0500 03/27/17 0500  Weight: 190 lb 14.7 oz (86.6 kg) 188 lb 11.4 oz (85.6 kg) 186 lb 8 oz (84.6 kg)    Telemetry    NSR without ectopy  Personally Reviewed  ECG    Evolving ST changes in inferolateral leads - Personally  Reviewed  Physical Exam   Well developed and nourished in no acute distress HENT normal Neck supple with JVP-flat Carotids brisk and full without bruits Clear Regular rate and rhythm, no murmurs or gallops Abd-soft with active BS without hepatomegaly No Clubbing cyanosis edema Skin-warm and dry A & Oriented  Grossly normal sensory and motor function   Labs    Chemistry  Recent Labs Lab 03/25/17 0059 03/25/17 0427 03/26/17 0200  NA 135 135 136  K 3.0* 3.5 3.3*  CL 100* 105 102  CO2 23 22 27   GLUCOSE 211* 275* 118*  BUN 15 14 9   CREATININE 1.26* 1.24 1.00  CALCIUM 9.0 7.7* 8.8*  PROT 6.0*  --   --   ALBUMIN 3.3*  --   --   AST 25  --   --   ALT 32  --   --   ALKPHOS 48  --   --   BILITOT 0.6  --   --   GFRNONAA 59* >60 >60  GFRAA >60 >60 >60  ANIONGAP 12 8 7      Hematology  Recent Labs Lab 03/25/17 0427 03/26/17 0200 03/26/17 1352  WBC 9.6 8.8 7.1  RBC 4.56 4.34 4.39  HGB 13.9 13.1 13.5  HCT 40.3 39.2 39.2  MCV 88.4 90.3 89.3  MCH 30.5 30.2 30.8  MCHC 34.5 33.4 34.4  RDW 13.2 13.3 13.6  PLT 147* 150 143*    Cardiac Enzymes  Recent Labs Lab 03/25/17 0059 03/25/17 0427 03/25/17 1202 03/25/17 1539  TROPONINI 0.07* 26.90* >65.00* >65.00*   No results for input(s): TROPIPOC in the last 168 hours.   BNP  Recent Labs Lab 03/25/17 1202  BNP 132.8*     DDimer No results for input(s): DDIMER in the last 168 hours.    Radiology    No results found.  Cardiac Studies   LHC: 03/25/17  Conclusion   1. Acute inferoposterior STEMI secondary to thrombotic occlusion of the mid left circumflex 2. Moderate LAD and RCA stenosis 3. Elevated LVEDP consistent with acute systolic heart failure 4. Complete heart block treated with temporary transvenous pacemaker insertion 5. Successful primary PCI the left circumflex using a 3.5 x 24 mm Promus DES  Recommendations:  Angiomax should continue for 2 hours. The patient was loaded with brilinta 180  mg prior to intubation  Would leave femoral arterial and venous sheaths in place until tomorrow morning. If his heart rhythm is stable the temporary pacemaker and arterial sheath could be removed at that time  CCM is called to help manage the ventilator. Appreciate their help.   Diagnostic Diagram       Post-Intervention Diagram         ECHO  EF 60-65%   Assessment & Plan  Principal Problem:   STEMI involving left circumflex coronary artery (HCC) Active Problems:   Dyslipidemia, goal LDL below 70   Acute systolic heart failure (HCC)   Acute hypoxemic respiratory failure (HCC)   Hypokalemia   Acute encephalopathy   Coronary artery disease involving native coronary artery of native heart with angina pectoris (HCC)   Presence of drug-eluting stent in left circumflex coronary artery  - Potassium repleted but will need to check it this am    1. Acute inferoposterior MI: >>>thrombotic occlusion of mid Lcx treated with Promus DES x1. Moderate LAD, RCA disease noted, with elevated LVEDP. Temp pacemaker placed in the lab due to complete heart block. Continued on angiomax for 2 hours post cath. Trop 26.9, renal function stable.    ASA, Brilinta, statin -- LV gram with severe hypokinesis of the inferior wall,>>Echo Normal EF Continue losartan add low dose metoprolol   HL: LDL 92, trig 229   Hypokalemia-    Diabetes  Diabetes coordinator consult-- will  Need teaching prob through PCP after discharge    Recheck K this am  Discharge after that  Will followup in [place] ? P[place]

## 2017-03-27 NOTE — Progress Notes (Signed)
Reviewed discharge instructions with pt and family with a focus on importance of medications, lifestyle modification, and follow up visits. All questions answered and pt discharged home with family.

## 2017-03-28 LAB — GLUCOSE, CAPILLARY: GLUCOSE-CAPILLARY: 106 mg/dL — AB (ref 65–99)

## 2017-03-28 MED FILL — Verapamil HCl IV Soln 2.5 MG/ML: INTRAVENOUS | Qty: 2 | Status: AC

## 2017-03-28 MED FILL — Medication: Qty: 1 | Status: AC

## 2017-04-03 NOTE — Progress Notes (Signed)
Cardiology Office Note:    Date:  04/04/2017   ID:  [name] Foster, DOB [birth year], MRN [id number]  PCP:  [name] Graves, DO  Cardiologist:  Dr. Sherren Foster    Referring MD: [name] Graves, DO   Chief Complaint  Patient presents with  . Hospitalization Follow-up    Status post myocardial infarction    History of Present Illness:    [name] Foster is a 61 y.o. male with a hx of DM, HTN, HL.  He was admitted 7/13-7/15 with an inferoposterior STEMI.  Emergent LHC demonstrated thrombotic occlusion of the mid LCx.  This was treated with a DES. Course was complicated by complete heart block, encephalopathy and respiratory failure requiring mechanical ventilation.  He required temporary pacemaker as well.  He did have a brief episode of AFib but converted to NSR.  His EF was normal on follow up echocardiogram. A1c was 9.4.    [name] Foster returns for follow-up. He is here with his daughter and wife today. Since DC, he has been doing well. He denies chest discomfort. He does note dyspnea at times. He feels fatigued. He denies syncope or near syncope. He denies PND or LE edema. He denies significant cough. He has quit smoking. He notes several episodes of hypoglycemia.  Prior CV studies:   The following studies were reviewed today:  Echo 03/25/17 EF 60-65, no RWMA, PASP 39, trivial eff  LHC 03/25/17 LAD prox 50 LCx mid 99; OM1 50 RCA mid 50 PCI: 3.5 x 24 mm Promus Premier DES to mid LCx  Myoview 3/12 EF 65, atten artifact  Past Medical History:  Diagnosis Date  . CAD in native artery    a. inferoposterior STEMI 03/25/17: LHC LM calcified w/o stenosis, pLAD 50%, m-dLCx 99% s/p PCI/DES w/ Promus 3.5 x 24 mm DES, ostial OM1 50%, small RCA with mRCA 50%  . Diabetes mellitus without complication ([place])   . High cholesterol   . Hyperlipidemia   . Hypertension   . Insomnia   . Pulmonary hypertension ([place])    a. TTE 03/25/17; EF 60-65%, nl WM, LV diastolic fxn nl, trivial AI, RV  cavity size, wall thickness, and sys fxn nl, trivial TR, PASP 39 mmHg, trivial pericardial effusion  . Sleep apnea    could not use a cpap  . Wears dentures    full top-partial bottom  . Wears glasses     Past Surgical History:  Procedure Laterality Date  . COLONOSCOPY    . CORONARY/GRAFT ACUTE MI REVASCULARIZATION N/A 03/25/2017   Procedure: Coronary/Graft Acute MI Revascularization;  Surgeon: [name] Mocha, MD;  Location: [facility];  Service: Cardiovascular;  Laterality: N/A;  . GANGLION CYST EXCISION Left 07/25/2014   Procedure: EXCISION VOLAR AND DORSAL GANGLION LEFT WRIST;  Surgeon: Leanora Cover, MD;  Location: [facility];  Service: Orthopedics;  Laterality: Left;  . KNEE ARTHROSCOPY     rifgr and left  . LEFT HEART CATH AND CORONARY ANGIOGRAPHY N/A 03/25/2017   Procedure: Left Heart Cath and Coronary Angiography;  Surgeon: [name] Mocha, MD;  Location: [facility];  Service: Cardiovascular;  Laterality: N/A;  . SHOULDER ARTHROSCOPY  2012   left  . SHOULDER ARTHROSCOPY     right  . TEMPORARY PACEMAKER N/A 03/25/2017   Procedure: Temporary Pacemaker;  Surgeon: [name] Mocha, MD;  Location: [facility];  Service: Cardiovascular;  Laterality: N/A;  . TONSILLECTOMY    . TOTAL KNEE ARTHROPLASTY  2002   left  Current Medications: Current Meds  Medication Sig  . Armodafinil 250 MG tablet Take 250 mg by mouth daily.  Marland Kitchen aspirin EC 81 MG EC tablet Take 1 tablet (81 mg total) by mouth daily.  Marland Kitchen atorvastatin (LIPITOR) 80 MG tablet Take 1 tablet (80 mg total) by mouth daily at 6 PM.  . gabapentin (NEURONTIN) 300 MG capsule Take 300 mg by mouth 3 (three) times daily as needed (NERVE PAIN).   Marland Kitchen HYDROcodone-acetaminophen (NORCO) 7.5-325 MG tablet Take 1 tablet by mouth every 6 (six) hours as needed for moderate pain.  Marland Kitchen losartan (COZAAR) 25 MG tablet Take 1 tablet (25 mg total) by mouth daily.  . metFORMIN (GLUCOPHAGE) 500 MG tablet Take 1 tablet  (500 mg total) by mouth 2 (two) times daily with a meal. May resume metformin on 03/28/2017  . metoprolol tartrate (LOPRESSOR) 25 MG tablet Take 1 tablet (25 mg total) by mouth 2 (two) times daily.  . nitroGLYCERIN (NITROSTAT) 0.4 MG SL tablet Place 1 tablet (0.4 mg total) under the tongue every 5 (five) minutes x 3 doses as needed for chest pain.  Marland Kitchen testosterone cypionate (DEPOTESTOSTERONE CYPIONATE) 200 MG/ML injection Inject 200 mg into the muscle every 14 (fourteen) days.  . ticagrelor (BRILINTA) 90 MG TABS tablet Take 1 tablet (90 mg total) by mouth 2 (two) times daily.  Marland Kitchen zolpidem (AMBIEN) 10 MG tablet Take 10 mg by mouth at bedtime as needed for sleep.   . [DISCONTINUED] glipiZIDE (GLUCOTROL) 5 MG tablet Take 0.5 tablets (2.5 mg total) by mouth 2 (two) times daily before a meal.     Allergies:   Patient has no known allergies.   Social History   Social History  . Marital status: Single    Spouse name: N/A  . Number of children: N/A  . Years of education: N/A   Social History Main Topics  . Smoking status: Current Every Day Smoker    Packs/day: 1.00  . Smokeless tobacco: Never Used  . Alcohol use Yes     Comment: occ  . Drug use: No  . Sexual activity: Not Asked   Other Topics Concern  . None   Social History Narrative   Per wife, pt was preparing to retire soon from his job at [employer].     Family Hx: The patient's family history includes Hypertension in his brother, father, and mother.  ROS:   Please see the history of present illness.    ROS All other systems reviewed and are negative.   EKGs/Labs/Other Test Reviewed:    EKG:  EKG is  ordered today.  The ekg ordered today demonstrates NSR, HR 61, low voltage, inferior Q waves, QTc 410 ms  Recent Labs: 03/25/2017: ALT 32; B Natriuretic Peptide 132.8 03/26/2017: Hemoglobin 13.5; Magnesium 1.6; Platelets 143 03/27/2017: BUN 9; Creatinine, Ser 1.05; Potassium 3.4; Sodium 134   Recent Lipid Panel Lab Results    Component Value Date/Time   CHOL 163 03/25/2017 04:27 AM   TRIG 229 (H) 03/25/2017 04:27 AM   HDL 25 (L) 03/25/2017 04:27 AM   CHOLHDL 6.5 03/25/2017 04:27 AM   LDLCALC 92 03/25/2017 04:27 AM    Physical Exam:    VS:  BP 120/80   Pulse 62   Ht 5\' 8"  (1.727 m)   Wt 183 lb 1.9 oz (83.1 kg)   BMI 27.84 kg/m     Wt Readings from Last 3 Encounters:  04/04/17 183 lb 1.9 oz (83.1 kg)  03/27/17 186 lb 8 oz (84.6  kg)  07/25/14 192 lb (87.1 kg)     Physical Exam  Constitutional: He is oriented to person, place, and time. He appears well-developed and well-nourished. No distress.  HENT:  Head: Normocephalic and atraumatic.  Eyes: No scleral icterus.  Neck: Normal range of motion. No JVD present.  Cardiovascular: Normal rate, regular rhythm, S1 normal and S2 normal.   No murmur heard. Pulmonary/Chest: Effort normal and breath sounds normal. He has no wheezes. He has no rhonchi. He has no rales.  Abdominal: Soft. There is no tenderness.  Musculoskeletal: He exhibits no edema or deformity (right groin without hematoma or bruit).  Neurological: He is alert and oriented to person, place, and time.  Skin: Skin is warm and dry.  Psychiatric: He has a normal mood and affect.    ASSESSMENT:    1. STEMI involving left circumflex coronary artery ([place])   2. Coronary artery disease involving native coronary artery of native heart without angina pectoris   3. Essential hypertension   4. Dyslipidemia, goal LDL below 70   5. Type 2 diabetes mellitus with complication, without long-term current use of insulin (HCC)    PLAN:    In order of problems listed above:  1. STEMI involving left circumflex coronary artery (HCC) - Status post recent inferoposterior STEMI c/b resp failure, CHB, encephalopathy.  CFx was tx with DES.  He is doing well without angina.  We discussed the importance of dual antiplatelet therapy.  He is interested in cardiac rehabilitation.  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  He does have to do some heavy lifting (50 lbs) and does do shift work.  I think he should remain out of work for a total of 6 weeks to recover.   -  Refer to [facility] ASA, Brilinta, beta-blocker, statin  -  Note to remain out of work x 6 weeks  2. Coronary artery disease involving native coronary artery of native heart without angina pectoris -  S/p STEMI tx with DES to LCx.  He is doing well without angina. He has residual 50% stenosis in the LAD and RCA.  Continue risk factor modification with high dose statin Rx, ASA, BP control.  He will follow up with his PCP for diabetes. He has quit smoking.   3. Essential hypertension -  BP is controlled.  Continue angiotensin receptor blocker, beta-blocker.  Check BMET today.   4. Dyslipidemia, goal LDL below 70 - Continue statin.   -  Plan: Lipid Profile, Hepatic function panel in 6 weeks.   5. Type 2 diabetes mellitus with complication, without long-term current use of insulin ([place]) - He has had hypoglycemic episodes at home. I have asked him to decrease his glipizide to once daily with the largest meal. Continue current dose of Metformin.  If he continues to have hypoglycemic episodes, he can stop his glipizide until he sees his PCP. I have asked him to follow-up since his PCP for further management of his diabetes.  Dispo:  Return in about 6 weeks (around 05/16/2017) for Routine Follow Up, w/ Dr. Burt Knack, or Richardson Dopp, PA-C.   Medication Adjustments/Labs and Tests Ordered: Current medicines are reviewed at length with the patient today.  Concerns regarding medicines are outlined above.  Tests Ordered: Orders Placed This Encounter  Procedures  . Basic Metabolic Panel (BMET)  . Lipid Profile  . Hepatic function panel  . AMB referral to cardiac rehabilitation  . EKG 12-Lead  Medication Changes: Meds ordered this encounter  Medications  . glipiZIDE (GLUCOTROL) 5 MG tablet    Sig: Take 0.5  tablets (2.5 mg total) by mouth daily before breakfast.    Signed, Richardson Dopp, PA-C  04/04/2017 10:00 AM    [facility] [address], [place], [place]  [address] Phone: [phone]; Fax: [phone]

## 2017-04-04 ENCOUNTER — Encounter: Payer: Self-pay | Admitting: Physician Assistant

## 2017-04-04 ENCOUNTER — Telehealth: Payer: Self-pay | Admitting: *Deleted

## 2017-04-04 ENCOUNTER — Ambulatory Visit (INDEPENDENT_AMBULATORY_CARE_PROVIDER_SITE_OTHER): Payer: 59 | Admitting: Physician Assistant

## 2017-04-04 VITALS — BP 120/80 | HR 62 | Ht 68.0 in | Wt 183.1 lb

## 2017-04-04 DIAGNOSIS — I10 Essential (primary) hypertension: Secondary | ICD-10-CM

## 2017-04-04 DIAGNOSIS — I2510 Atherosclerotic heart disease of native coronary artery without angina pectoris: Secondary | ICD-10-CM

## 2017-04-04 DIAGNOSIS — I2121 ST elevation (STEMI) myocardial infarction involving left circumflex coronary artery: Secondary | ICD-10-CM

## 2017-04-04 DIAGNOSIS — E118 Type 2 diabetes mellitus with unspecified complications: Secondary | ICD-10-CM | POA: Diagnosis not present

## 2017-04-04 DIAGNOSIS — E785 Hyperlipidemia, unspecified: Secondary | ICD-10-CM

## 2017-04-04 LAB — BASIC METABOLIC PANEL
BUN / CREAT RATIO: 10 (ref 10–24)
BUN: 10 mg/dL (ref 8–27)
CHLORIDE: 104 mmol/L (ref 96–106)
CO2: 25 mmol/L (ref 20–29)
Calcium: 10 mg/dL (ref 8.6–10.2)
Creatinine, Ser: 0.99 mg/dL (ref 0.76–1.27)
GFR calc non Af Amer: 81 mL/min/{1.73_m2} (ref >59)
GFR, EST AFRICAN AMERICAN: 94 mL/min/{1.73_m2} (ref >59)
GLUCOSE: 97 mg/dL (ref 65–99)
POTASSIUM: 4.8 mmol/L (ref 3.5–5.2)
Sodium: 139 mmol/L (ref 134–144)

## 2017-04-04 MED ORDER — GLIPIZIDE 5 MG PO TABS: 2.5000 mg | ORAL_TABLET | Freq: Every day | ORAL | Status: DC

## 2017-04-04 NOTE — Patient Instructions (Addendum)
Medication Instructions:  1. DECREASE GLIPIZIDE TO ONCE A DAY WITH THE BIGGEST MEAL OF THE DAY; IF YOU BLOOD SUGARS CONTINUE TO BE TOO LOW OK PER [facility],PAC TO STOP GLIPIZIDE, MAKE SURE TO FOLLOW UP WITH PRIMARY CARE ABOUT BLOOD SUGARS  Labwork: 1. TODAY BMET ONLY  2. 05/16/17 FASTING LIPID AND LIVER PANEL TO BE DONE  Testing/Procedures: NONE ORDERED TODAY  Follow-Up: 6 WEEKS WITH [facility], PAC SAME DAY DR. Burt Knack IS IN THE OFFICE   Any Other Special Instructions Will Be Listed Below (If Applicable).  A REFERRAL HAS BEEN PLACED FOR CARDIAC REHAB AT [facility]  YOU HAVE BEEN GIVEN A WORK NOTE TODAY   If you need a refill on your cardiac medications before your next appointment, please call your pharmacy.

## 2017-04-04 NOTE — Telephone Encounter (Signed)
DPR ok to lmom. I lmom lab work looks good. Continue on current Tx plan. If any questions feel free to call the office.

## 2017-04-04 NOTE — Telephone Encounter (Signed)
-----   Message from Liliane Shi, [place] sent at 04/04/2017  5:08 PM EDT ----- Please call patient: The kidney function (BUN, Creatinine) and potassium are normal. All other parameters are within acceptable limits and no further intervention or testing required. Continue with current treatment plan. Richardson Dopp, PA-C    04/04/2017 5:08 PM

## 2017-04-25 ENCOUNTER — Encounter: Payer: Self-pay | Admitting: Physician Assistant

## 2017-04-26 ENCOUNTER — Encounter: Payer: 59 | Attending: *Deleted | Admitting: Nutrition

## 2017-04-26 VITALS — Ht 68.0 in | Wt 184.0 lb

## 2017-04-26 DIAGNOSIS — E669 Obesity, unspecified: Secondary | ICD-10-CM

## 2017-04-26 DIAGNOSIS — E7800 Pure hypercholesterolemia, unspecified: Secondary | ICD-10-CM

## 2017-04-26 DIAGNOSIS — E118 Type 2 diabetes mellitus with unspecified complications: Secondary | ICD-10-CM | POA: Insufficient documentation

## 2017-04-26 DIAGNOSIS — E1165 Type 2 diabetes mellitus with hyperglycemia: Secondary | ICD-10-CM

## 2017-04-26 DIAGNOSIS — IMO0002 Reserved for concepts with insufficient information to code with codable children: Secondary | ICD-10-CM

## 2017-04-26 DIAGNOSIS — I252 Old myocardial infarction: Secondary | ICD-10-CM

## 2017-04-26 NOTE — Patient Instructions (Signed)
Goals 1 Follow My Plate 2. Eat 3-4 carb choices per meal 3. Drink only water 4. Increase fresh fruits and vegetables 5. Test in am and before bedtime daily. Exercise 30 minutes daily Get A1C down to less than 7%.

## 2017-04-26 NOTE — Progress Notes (Signed)
Diabetes Self-Management Education  Visit Type: First/Initial  Appt. Start Time: 0800 Appt. End Time: 930  04/26/2017  [name], identified by name and date of birth, is a 61 y.o. male with a diagnosis of Diabetes: Type 2. LIves with his wife. Had a HA in July 2018. Had 1 stent put in. Has changed eating habits. Lost 15 lbs. Is eating more baked and broiled foods and walking 15 minutes a day.  Sees Cardiologist. Compliant with diet. Diet is improving.  Lab Results  Component Value Date   HGBA1C 9.4 (H) 03/25/2017   Lipid Panel     Component Value Date/Time   CHOL 163 03/25/2017 0427   TRIG 229 (H) 03/25/2017 0427   HDL 25 (L) 03/25/2017 0427   CHOLHDL 6.5 03/25/2017 0427   VLDL 46 (H) 03/25/2017 0427   LDLCALC 92 03/25/2017 0427   CMP Latest Ref Rng & Units 04/04/2017 03/27/2017 03/26/2017  Glucose 65 - 99 mg/dL 97 175(H) 118(H)  BUN 8 - 27 mg/dL 10 9 9   Creatinine 0.76 - 1.27 mg/dL 0.99 1.05 1.00  Sodium 134 - 144 mmol/L 139 134(L) 136  Potassium 3.5 - 5.2 mmol/L 4.8 3.4(L) 3.3(L)  Chloride 96 - 106 mmol/L 104 99(L) 102  CO2 20 - 29 mmol/L 25 25 27   Calcium 8.6 - 10.2 mg/dL 10.0 9.1 8.8(L)  Total Protein 6.5 - 8.1 g/dL - - -  Total Bilirubin 0.3 - 1.2 mg/dL - - -  Alkaline Phos 38 - 126 U/L - - -  AST 15 - 41 U/L - - -  ALT 17 - 63 U/L - - -       ASSESSMENT  Height 5\' 8"  (1.727 m), weight 184 lb (83.5 kg). Body mass index is 27.98 kg/m.      Diabetes Self-Management Education - 04/26/17 0814      Visit Information   Visit Type First/Initial     Initial Visit   Diabetes Type Type 2   Are you currently following a meal plan? No   Are you taking your medications as prescribed? Yes   Date Diagnosed 2014     Health Coping   How would you rate your overall health? Fair     Psychosocial Assessment   Patient Belief/Attitude about Diabetes Motivated to manage diabetes   Self-care barriers None   Self-management support Family   Other persons  present Patient   Patient Concerns Nutrition/Meal planning;Medication;Monitoring;Healthy Lifestyle;Problem Solving;Weight Control   Special Needs None   Preferred Learning Style No preference indicated   Learning Readiness Ready   How often do you need to have someone help you when you read instructions, pamphlets, or other written materials from your doctor or pharmacy? 1 - Never   What is the last grade level you completed in school? 13     Pre-Education Assessment   Patient understands the diabetes disease and treatment process. Needs Review   Patient understands incorporating nutritional management into lifestyle. Needs Review   Patient undertands incorporating physical activity into lifestyle. Needs Review   Patient understands using medications safely. Needs Review   Patient understands monitoring blood glucose, interpreting and using results Needs Review   Patient understands prevention, detection, and treatment of acute complications. Needs Review   Patient understands prevention, detection, and treatment of chronic complications. Needs Review   Patient understands how to develop strategies to address psychosocial issues. Needs Review   Patient understands how to develop strategies to promote health/change behavior. Needs Review  Complications   Last HgB A1C per patient/outside source 9.4 %   How often do you check your blood sugar? 3-4 times/day   Fasting Blood glucose range (mg/dL) 70-129   Postprandial Blood glucose range (mg/dL) 130-179   Number of hypoglycemic episodes per month 0   Have you had a dilated eye exam in the past 12 months? Yes   Have you had a dental exam in the past 12 months? Yes   Are you checking your feet? Yes   How many days per week are you checking your feet? 7     Dietary Intake   Breakfast Whole grain cereal 1 cup, almond milk, fruit, coffee sweetner   Snack (morning) cottage cheese with fruit   Lunch Fish, brussel sprouts slaw,  [place] flavored  water   Snack (afternoon) chicken salad with  crackers 2, water   Dinner Chicken breast,  toss salad with O/V, [place] flavored water   Snack (evening) Rice Chex cereal with milk   Beverage(s) water     Exercise   Exercise Type Light (walking / raking leaves)   How many days per week to you exercise? 7   How many minutes per day do you exercise? 15   Total minutes per week of exercise 105     Patient Education   Previous Diabetes Education No   Disease state  Factors that contribute to the development of diabetes;Explored patient's options for treatment of their diabetes   Nutrition management  Role of diet in the treatment of diabetes and the relationship between the three main macronutrients and blood glucose level;Carbohydrate counting;Meal timing in regards to the patients' current diabetes medication.;Reviewed blood glucose goals for pre and post meals and how to evaluate the patients' food intake on their blood glucose level.;Meal options for control of blood glucose level and chronic complications.   Physical activity and exercise  Role of exercise on diabetes management, blood pressure control and cardiac health.;Identified with patient nutritional and/or medication changes necessary with exercise.;Helped patient identify appropriate exercises in relation to his/her diabetes, diabetes complications and other health issue.   Medications Reviewed patients medication for diabetes, action, purpose, timing of dose and side effects.   Acute complications Taught treatment of hypoglycemia - the 15 rule.;Discussed and identified patients' treatment of hyperglycemia.   Chronic complications Relationship between chronic complications and blood glucose control;Assessed and discussed foot care and prevention of foot problems;Retinopathy and reason for yearly dilated eye exams;Lipid levels, blood glucose control and heart disease;Nephropathy, what it is, prevention of, the use of ACE, ARB's and early  detection of through urine microalbumia.;Reviewed with patient heart disease, higher risk of, and prevention   Psychosocial adjustment Worked with patient to identify barriers to care and solutions;Role of stress on diabetes   Personal strategies to promote health Lifestyle issues that need to be addressed for better diabetes care     Individualized Goals (developed by patient)   Nutrition Follow meal plan discussed;General guidelines for healthy choices and portions discussed;Adjust meds/carbs with exercise as discussed   Physical Activity Exercise 3-5 times per week;30 minutes per day   Medications take my medication as prescribed   Monitoring  test my blood glucose as discussed   Reducing Risk examine blood glucose patterns     Post-Education Assessment   Patient understands the diabetes disease and treatment process. Needs Review   Patient understands incorporating nutritional management into lifestyle. Needs Review   Patient undertands incorporating physical activity into lifestyle. Needs Review  Patient understands using medications safely. Needs Review   Patient understands monitoring blood glucose, interpreting and using results Needs Review   Patient understands prevention, detection, and treatment of acute complications. Needs Review   Patient understands prevention, detection, and treatment of chronic complications. Needs Review   Patient understands how to develop strategies to address psychosocial issues. Needs Review   Patient understands how to develop strategies to promote health/change behavior. Needs Review     Outcomes   Expected Outcomes Demonstrated interest in learning. Expect positive outcomes   Future DMSE 4-6 wks   Program Status Completed      Individualized Plan for Diabetes Self-Management Training:   Learning Objective:  Patient will have a greater understanding of diabetes self-management. Patient education plan is to attend individual and/or group  sessions per assessed needs and concerns.   Plan:   Patient Instructions  Goals 1 Follow My Plate 2. Eat 3-4 carb choices per meal 3. Drink only water 4. Increase fresh fruits and vegetables 5. Test in am and before bedtime daily. Exercise 30 minutes daily Get A1C down to less than 7%.    Expected Outcomes:  Demonstrated interest in learning. Expect positive outcomes  Education material provided: Living Well with Diabetes, Food label handouts, A1C conversion sheet, Meal plan card, My Plate and Carbohydrate counting sheet  If problems or questions, patient to contact team via:  Phone and Email  Future DSME appointment: 4-6 wks

## 2017-05-12 NOTE — Progress Notes (Signed)
Cardiology Office Note:    Date:  05/13/2017   ID:  [name] Foster, DOB Oct 19, 1955, MRN [id number]  PCP:  [name], DO  Cardiologist:  Dr. Sherren Mocha    Referring MD: [name], DO   Chief Complaint  Patient presents with  . Coronary Artery Disease    follow up    History of Present Illness:    [name] Foster is a 61 y.o. male with a hx of CAD, DM, HTN, HL.  He was admitted in 03/2017 with an inferoposterior STEMI.  Emergent LHC demonstrated thrombotic occlusion of the mid LCx.  This was treated with a DES. Course was complicated by complete heart block, encephalopathy and respiratory failure requiring mechanical ventilation.  He required temporary pacemaker as well.  He did have a brief episode of AFib but converted to NSR.  His EF was normal on follow up echocardiogram. He was last seen 04/04/17.  [name] returns for Cardiology follow up.  He is here today with his wife. Since last seen, he has been doing well. He denies anginal symptoms. He denies dyspnea with exertion. But he does note occasional episodes of shortness of breath. Overall, this seems to be improving. I suspect that this is a side effect to Ticagrelor. He denies syncope, PND or edema.  Prior CV studies:   The following studies were reviewed today:  Echo 03/25/17 EF 60-65, no RWMA, PASP 39, trivial eff   LHC 03/25/17 LAD prox 50 LCx mid 99; OM1 50 RCA mid 50 PCI: 3.5 x 24 mm Promus Premier DES to mid LCx   Myoview 3/12 EF 65, atten artifact   Past Medical History:  Diagnosis Date  . CAD in native artery    a. inferoposterior STEMI 03/25/17: LHC LM calcified w/o stenosis, pLAD 50%, m-dLCx 99% s/p PCI/DES w/ Promus 3.5 x 24 mm DES, ostial OM1 50%, small RCA with mRCA 50%  . Diabetes mellitus without complication ([place])   . High cholesterol   . Hyperlipidemia   . Hypertension   . Insomnia   . Pulmonary hypertension ([place])    a. TTE 03/25/17; EF 60-65%, nl WM, LV diastolic fxn nl, trivial  AI, RV cavity size, wall thickness, and sys fxn nl, trivial TR, PASP 39 mmHg, trivial pericardial effusion  . Sleep apnea    could not use a cpap  . Wears dentures    full top-partial bottom  . Wears glasses     Past Surgical History:  Procedure Laterality Date  . COLONOSCOPY    . CORONARY/GRAFT ACUTE MI REVASCULARIZATION N/A 03/25/2017   Procedure: Coronary/Graft Acute MI Revascularization;  Surgeon: Sherren Mocha, MD;  Location: [facility];  Service: Cardiovascular;  Laterality: N/A;  . GANGLION CYST EXCISION Left 07/25/2014   Procedure: EXCISION VOLAR AND DORSAL GANGLION LEFT WRIST;  Surgeon: Leanora Cover, MD;  Location: [facility];  Service: Orthopedics;  Laterality: Left;  . KNEE ARTHROSCOPY     rifgr and left  . LEFT HEART CATH AND CORONARY ANGIOGRAPHY N/A 03/25/2017   Procedure: Left Heart Cath and Coronary Angiography;  Surgeon: Sherren Mocha, MD;  Location: [facility];  Service: Cardiovascular;  Laterality: N/A;  . SHOULDER ARTHROSCOPY  2012   left  . SHOULDER ARTHROSCOPY     right  . TEMPORARY PACEMAKER N/A 03/25/2017   Procedure: Temporary Pacemaker;  Surgeon: Sherren Mocha, MD;  Location: [facility];  Service: Cardiovascular;  Laterality: N/A;  . TONSILLECTOMY    .  TOTAL KNEE ARTHROPLASTY  2002   left    Current Medications: Current Meds  Medication Sig  . aspirin EC 81 MG EC tablet Take 1 tablet (81 mg total) by mouth daily.  Marland Kitchen atorvastatin (LIPITOR) 80 MG tablet Take 1 tablet (80 mg total) by mouth daily at 6 PM.  . gabapentin (NEURONTIN) 300 MG capsule Take 300 mg by mouth 3 (three) times daily as needed (NERVE PAIN).   Marland Kitchen HYDROcodone-acetaminophen (NORCO) 7.5-325 MG tablet Take 1 tablet by mouth every 6 (six) hours as needed for moderate pain.  Marland Kitchen losartan (COZAAR) 25 MG tablet Take 1 tablet (25 mg total) by mouth daily.  . metFORMIN (GLUCOPHAGE) 500 MG tablet Take 1 tablet (500 mg total) by mouth 2 (two) times daily with a  meal. May resume metformin on 03/28/2017  . metoprolol tartrate (LOPRESSOR) 25 MG tablet Take 1 tablet (25 mg total) by mouth 2 (two) times daily.  . nitroGLYCERIN (NITROSTAT) 0.4 MG SL tablet Place 1 tablet (0.4 mg total) under the tongue every 5 (five) minutes x 3 doses as needed for chest pain.  Marland Kitchen testosterone cypionate (DEPOTESTOSTERONE CYPIONATE) 200 MG/ML injection Inject 200 mg into the muscle every 14 (fourteen) days.  . ticagrelor (BRILINTA) 90 MG TABS tablet Take 1 tablet (90 mg total) by mouth 2 (two) times daily.  Marland Kitchen zolpidem (AMBIEN) 10 MG tablet Take 10 mg by mouth at bedtime as needed for sleep.      Allergies:   Patient has no known allergies.   Social History   Social History  . Marital status: Single    Spouse name: N/A  . Number of children: N/A  . Years of education: N/A   Social History Main Topics  . Smoking status: Former Smoker    Packs/day: 1.00    Quit date: 03/25/2017  . Smokeless tobacco: Never Used  . Alcohol use Yes     Comment: occ  . Drug use: No  . Sexual activity: Not Asked   Other Topics Concern  . None   Social History Narrative   Per wife, pt was preparing to retire soon from his job at [employer].     Family Hx: The patient's family history includes Hypertension in his brother, father, and mother.  ROS:   Please see the history of present illness.    ROS All other systems reviewed and are negative.   EKGs/Labs/Other Test Reviewed:    EKG:  EKG is  ordered today.  The ekg ordered today demonstrates NSR, HR 61, normal axis, low voltage, PVC, QTc 424 ms  Recent Labs: 03/25/2017: ALT 32; B Natriuretic Peptide 132.8 03/26/2017: Hemoglobin 13.5; Magnesium 1.6; Platelets 143 04/04/2017: BUN 10; Creatinine, Ser 0.99; Potassium 4.8; Sodium 139   Recent Lipid Panel Lab Results  Component Value Date/Time   CHOL 163 03/25/2017 04:27 AM   TRIG 229 (H) 03/25/2017 04:27 AM   HDL 25 (L) 03/25/2017 04:27 AM   CHOLHDL 6.5 03/25/2017 04:27 AM   LDLCALC  92 03/25/2017 04:27 AM    Physical Exam:    VS:  BP 138/78   Pulse 60   Ht 5\' 8"  (1.727 m)   Wt 183 lb (83 kg)   SpO2 95%   BMI 27.83 kg/m     Wt Readings from Last 3 Encounters:  05/13/17 183 lb (83 kg)  04/26/17 184 lb (83.5 kg)  04/04/17 183 lb 1.9 oz (83.1 kg)     Physical Exam  Constitutional: He is oriented to person, place,  and time. He appears well-developed and well-nourished. No distress.  Neck: No JVD present.  Cardiovascular: Normal rate and regular rhythm.   No murmur heard. Pulmonary/Chest: Effort normal. He has no rales.  Abdominal: Soft. There is no tenderness.  Musculoskeletal: He exhibits no edema.  Neurological: He is alert and oriented to person, place, and time.  Skin: Skin is warm and dry.  Psychiatric: He has a normal mood and affect.    ASSESSMENT:    1. Coronary artery disease involving native coronary artery of native heart without angina pectoris   2. Essential hypertension   3. Dyslipidemia, goal LDL below 70    PLAN:    In order of problems listed above:  1. Coronary artery disease involving native coronary artery of native heart without angina pectoris S/p inferoposterior STEMI in 7/18 treated with DES to the LCx. He has moderate nonobstructive disease in the LAD and RCA that is treated medically. He is doing well without anginal symptoms. He is having some shortness of breath as a side effect to Ticagrelor. Overall, this symptom seems to be improving. We discussed monitoring his side effects. If these continue or become more problematic, we can always change Ticagrelor to clopidogrel. Continue aspirin, Ticagrelor, statin, beta blocker, ARB. Plan follow-up with Dr. Burt Knack 6 months post-MI.  2. Essential hypertension The patient's blood pressure is controlled on his current regimen.  Continue current therapy. Obtain BMET today.   3. Dyslipidemia, goal LDL below 70 Continue Lipitor 80.  Obtain Lipids/LFTs today.   Dispo:  Return in about  5 months (around 10/13/2017) for Routine Follow Up, w/ Dr. Burt Knack.   Medication Adjustments/Labs and Tests Ordered: Current medicines are reviewed at length with the patient today.  Concerns regarding medicines are outlined above.  Tests Ordered: Orders Placed This Encounter  Procedures  . EKG 12-Lead   Medication Changes: No orders of the defined types were placed in this encounter.   Signed, Richardson Dopp, PA-C  05/13/2017 8:45 AM    [facility] [address], [place], [place]  [address] Phone: [phone]; Fax: [phone]

## 2017-05-13 ENCOUNTER — Other Ambulatory Visit: Payer: 59 | Admitting: *Deleted

## 2017-05-13 ENCOUNTER — Encounter: Payer: Self-pay | Admitting: Physician Assistant

## 2017-05-13 ENCOUNTER — Telehealth: Payer: Self-pay | Admitting: *Deleted

## 2017-05-13 ENCOUNTER — Ambulatory Visit (INDEPENDENT_AMBULATORY_CARE_PROVIDER_SITE_OTHER): Payer: 59 | Admitting: Physician Assistant

## 2017-05-13 VITALS — BP 138/78 | HR 60 | Ht 68.0 in | Wt 183.0 lb

## 2017-05-13 DIAGNOSIS — I2510 Atherosclerotic heart disease of native coronary artery without angina pectoris: Secondary | ICD-10-CM

## 2017-05-13 DIAGNOSIS — E785 Hyperlipidemia, unspecified: Secondary | ICD-10-CM

## 2017-05-13 DIAGNOSIS — E876 Hypokalemia: Secondary | ICD-10-CM

## 2017-05-13 DIAGNOSIS — I10 Essential (primary) hypertension: Secondary | ICD-10-CM | POA: Diagnosis not present

## 2017-05-13 LAB — HEPATIC FUNCTION PANEL
ALBUMIN: 4.3 g/dL (ref 3.6–4.8)
ALK PHOS: 60 IU/L (ref 39–117)
ALT: 23 IU/L (ref 0–44)
AST: 24 IU/L (ref 0–40)
Bilirubin Total: 0.5 mg/dL (ref 0.0–1.2)
Bilirubin, Direct: 0.15 mg/dL (ref 0.00–0.40)
TOTAL PROTEIN: 6.3 g/dL (ref 6.0–8.5)

## 2017-05-13 LAB — BASIC METABOLIC PANEL
BUN / CREAT RATIO: 15 (ref 10–24)
BUN: 19 mg/dL (ref 8–27)
CALCIUM: 9.3 mg/dL (ref 8.6–10.2)
CO2: 24 mmol/L (ref 20–29)
CREATININE: 1.24 mg/dL (ref 0.76–1.27)
Chloride: 103 mmol/L (ref 96–106)
GFR, EST AFRICAN AMERICAN: 72 mL/min/{1.73_m2} (ref >59)
GFR, EST NON AFRICAN AMERICAN: 62 mL/min/{1.73_m2} (ref >59)
Glucose: 97 mg/dL (ref 65–99)
Potassium: 4.3 mmol/L (ref 3.5–5.2)
Sodium: 141 mmol/L (ref 134–144)

## 2017-05-13 LAB — LIPID PANEL
CHOL/HDL RATIO: 3.4 ratio (ref 0.0–5.0)
Cholesterol, Total: 81 mg/dL — ABNORMAL LOW (ref 100–199)
HDL: 24 mg/dL — AB (ref >39)
LDL CALC: 41 mg/dL (ref 0–99)
Triglycerides: 82 mg/dL (ref 0–149)
VLDL CHOLESTEROL CAL: 16 mg/dL (ref 5–40)

## 2017-05-13 NOTE — Patient Instructions (Signed)
Medication Instructions:  Your physician recommends that you continue on your current medications as directed. Please refer to the Current Medication list given to you today.   Labwork: YOU ARE ALREADY SCHEDULED FOR LAB WORK TODAY  Testing/Procedures: NONE ORDERED   Follow-Up: DR. Burt Knack 09/2017; OUR OFFICE WILL SEND OUT A REMINDER LETTER A FEW MONTHS EARLIER ASKING YOU TO CALL AND MAKE AN APPT  Any Other Special Instructions Will Be Listed Below (If Applicable).     If you need a refill on your cardiac medications before your next appointment, please call your pharmacy.

## 2017-05-13 NOTE — Telephone Encounter (Signed)
Pt has been notified of lab results BMET, LIPID AND LIVER by phone with verbal understanding. Pt thanked me for my call.

## 2017-05-13 NOTE — Telephone Encounter (Signed)
-----   Message from Liliane Shi, [place] sent at 05/13/2017  5:24 PM EDT ----- BMET results did not come to me - not sure who they went to.   The Kidney function is normal. Continue with current treatment plan. Richardson Dopp, PA-C   05/13/2017 5:24 PM

## 2017-05-17 ENCOUNTER — Other Ambulatory Visit: Payer: 59

## 2017-05-17 ENCOUNTER — Telehealth: Payer: Self-pay | Admitting: *Deleted

## 2017-05-17 NOTE — Telephone Encounter (Signed)
-----   Message from Liliane Shi, [place] sent at 05/16/2017  8:47 PM EDT ----- Please call the patient Kidney function is normal. Continue with current treatment plan. Richardson Dopp, PA-C   05/16/2017 8:47 PM

## 2017-05-17 NOTE — Telephone Encounter (Signed)
Pt has been notified of lab results by phone with verbal understanding. Pt thanked me for my call. Pt request lab results to be mailed to him. I verified pt's address.

## 2017-06-02 ENCOUNTER — Ambulatory Visit: Payer: 59 | Admitting: Nutrition

## 2017-06-02 ENCOUNTER — Telehealth: Payer: Self-pay | Admitting: Nutrition

## 2017-06-02 NOTE — Telephone Encounter (Signed)
vm left to call for rescheduled missed appt.

## 2017-09-15 ENCOUNTER — Telehealth: Payer: Self-pay | Admitting: Cardiovascular Disease

## 2017-09-15 NOTE — Telephone Encounter (Signed)
New message  Pt c/o of Chest Pain: STAT if CP now or developed within 24 hours  1. Are you having CP right now? yes  2. Are you experiencing any other symptoms (ex. SOB, nausea, vomiting, sweating)? Tiredness, dizziness  3. How long have you been experiencing CP? A few days  4. Is your CP continuous or coming and going? dont know  5. Have you taken Nitroglycerin? No ?

## 2017-09-15 NOTE — Telephone Encounter (Signed)
Received call transferred directly from operator and spoke with pt's wife. She reports pt mentioned to her yesterday that he has been having chest tightness. She does not know when it started. He is currently asleep. Has been tired for several months. Has shortness of breath which wife states is related to Brilinta. He has not taken NTG or anything else for chest tightness. I told wife pt could try NTG when having tightness to see if this helps.  Wife states pt wanted her to check when he was due to see Dr. Burt Knack. Will forward to Dr. Antionette Char nurse regarding appt

## 2017-09-15 NOTE — Telephone Encounter (Signed)
Scheduled patient tomorrow at 1100 with Dr. Burt Knack. Reiterated to patient's wife to seek medical attention if symptoms worsen prior to appointment.

## 2017-09-16 ENCOUNTER — Encounter: Payer: Self-pay | Admitting: Cardiovascular Disease

## 2017-09-16 ENCOUNTER — Ambulatory Visit: Payer: 59 | Admitting: Cardiovascular Disease

## 2017-09-16 VITALS — BP 142/80 | HR 69 | Ht 68.0 in | Wt 183.0 lb

## 2017-09-16 DIAGNOSIS — I200 Unstable angina: Secondary | ICD-10-CM | POA: Diagnosis not present

## 2017-09-16 DIAGNOSIS — R079 Chest pain, unspecified: Secondary | ICD-10-CM

## 2017-09-16 DIAGNOSIS — I2510 Atherosclerotic heart disease of native coronary artery without angina pectoris: Secondary | ICD-10-CM

## 2017-09-16 DIAGNOSIS — I10 Essential (primary) hypertension: Secondary | ICD-10-CM | POA: Diagnosis not present

## 2017-09-16 MED ORDER — ISOSORBIDE MONONITRATE ER 30 MG PO TB24: 15.0000 mg | ORAL_TABLET | Freq: Every day | ORAL | 11 refills | Status: DC

## 2017-09-16 NOTE — Patient Instructions (Addendum)
Medication Instructions:  1) START IMDUR (isosorbide) 15 mg daily  Labwork: TODAY: BMET, CBC, PT/INR  Testing/Procedures: Your physician has requested that you have a cardiac catheterization. Cardiac catheterization is used to diagnose and/or treat various heart conditions. Doctors may recommend this procedure for a number of different reasons. The most common reason is to evaluate chest pain. Chest pain can be a symptom of coronary artery disease (CAD), and cardiac catheterization can show whether plaque is narrowing or blocking your heart's arteries. This procedure is also used to evaluate the valves, as well as measure the blood flow and oxygen levels in different parts of your heart. For further information please visit [email or web address]. Please follow instruction sheet, as given.  Follow-Up: Your provider recommends that you schedule a follow-up appointment AS NEEDED pending catheterization results.  Any Other Special Instructions Will Be Listed Below (If Applicable).   [facility] [address] [place] [address] Dept: [phone] Loc: [place]  09/16/2017  You are scheduled for a Cardiac Catheterization on Tuesday, January 8 with Dr. Glenetta Hew.  1. Please arrive at the [facility] (Main Entrance A) at [facility]: [address] [place], [place] [address] at 8:00 AM (two hours before your procedure to ensure your preparation). Free valet parking service is available.   Special note: Every effort is made to have your procedure done on time. Please understand that emergencies sometimes delay scheduled procedures.  2. Diet: Do not eat or drink anything after midnight prior to your procedure except sips of water to take medications.  3. Labs: Today!  4. Medication instructions in preparation for your procedure:  1) MAKE SURE TO TAKE BRILINTA AND  ASPIRIN THE AM OF YOUR TEST  2) HOLD METFORMIN the day of and day before your test  5. Plan for one night stay--bring personal belongings. 6. Bring a current list of your medications and current insurance cards. 7. You MUST have a responsible person to drive you home. 8. Someone MUST be with you the first 24 hours after you arrive home or your discharge will be delayed. 9. Please wear clothes that are easy to get on and off and wear slip-on shoes.  Thank you for allowing [place] to care for you!   -- [facility]     If you need a refill on your cardiac medications before your next appointment, please call your pharmacy.

## 2017-09-16 NOTE — Progress Notes (Signed)
Cardiology Office Note Date:  09/16/2017   ID:  [name] Foster, DOB [birth year], MRN [id number]  PCP:  [name], DO  Cardiologist:  Sherren Mocha, MD    Chief Complaint  Patient presents with  . Chest Pain     History of Present Illness: [name] Foster is a 62 y.o. male who presents for follow-up of coronary artery disease.  The patient presented with an inferoposterior STEMI in July 2018.  He was unstable at the time of presentation and required temporary transvenous pacing and mechanical ventilation.  He underwent PCI of the left circumflex with a drug-eluting stent.  He was noted to have moderate mid RCA and mid LAD stenoses.  His left ventricular systolic function has been well preserved.  The patient has been doing well until approximately 1 week ago when he developed chest pressure and tightness in the substernal region.  He has experienced this with physical exertion that is relieved within about 1 minute of rest.  He has had a few episodes of chest discomfort when he is lying in the supine position.  He denies shortness of breath, edema, heart palpitations, lightheadedness, or syncope.  He has not taken sublingual nitroglycerin.   Past Medical History:  Diagnosis Date  . CAD in native artery    a. inferoposterior STEMI 03/25/17: LHC LM calcified w/o stenosis, pLAD 50%, m-dLCx 99% s/p PCI/DES w/ Promus 3.5 x 24 mm DES, ostial OM1 50%, small RCA with mRCA 50%  . Diabetes mellitus without complication ([place])   . High cholesterol   . Hyperlipidemia   . Hypertension   . Insomnia   . Pulmonary hypertension ([place])    a. TTE 03/25/17; EF 60-65%, nl WM, LV diastolic fxn nl, trivial AI, RV cavity size, wall thickness, and sys fxn nl, trivial TR, PASP 39 mmHg, trivial pericardial effusion  . Sleep apnea    could not use a cpap  . Wears dentures    full top-partial bottom  . Wears glasses     Past Surgical History:  Procedure Laterality Date  . COLONOSCOPY    .  CORONARY/GRAFT ACUTE MI REVASCULARIZATION N/A 03/25/2017   Procedure: Coronary/Graft Acute MI Revascularization;  Surgeon: Sherren Mocha, MD;  Location: [facility];  Service: Cardiovascular;  Laterality: N/A;  . GANGLION CYST EXCISION Left 07/25/2014   Procedure: EXCISION VOLAR AND DORSAL GANGLION LEFT WRIST;  Surgeon: Leanora Cover, MD;  Location: [facility];  Service: Orthopedics;  Laterality: Left;  . KNEE ARTHROSCOPY     rifgr and left  . LEFT HEART CATH AND CORONARY ANGIOGRAPHY N/A 03/25/2017   Procedure: Left Heart Cath and Coronary Angiography;  Surgeon: Sherren Mocha, MD;  Location: [facility];  Service: Cardiovascular;  Laterality: N/A;  . SHOULDER ARTHROSCOPY  2012   left  . SHOULDER ARTHROSCOPY     right  . TEMPORARY PACEMAKER N/A 03/25/2017   Procedure: Temporary Pacemaker;  Surgeon: Sherren Mocha, MD;  Location: [facility];  Service: Cardiovascular;  Laterality: N/A;  . TONSILLECTOMY    . TOTAL KNEE ARTHROPLASTY  2002   left    Current Outpatient Medications  Medication Sig Dispense Refill  . aspirin EC 81 MG EC tablet Take 1 tablet (81 mg total) by mouth daily. 30 tablet 11  . atorvastatin (LIPITOR) 80 MG tablet Take 1 tablet (80 mg total) by mouth daily at 6 PM. 30 tablet 11  . gabapentin (NEURONTIN) 300 MG capsule Take 300 mg by mouth 3 (  three) times daily as needed (NERVE PAIN).     Marland Kitchen HYDROcodone-acetaminophen (NORCO) 7.5-325 MG tablet Take 1 tablet by mouth every 6 (six) hours as needed for moderate pain.    Marland Kitchen losartan (COZAAR) 25 MG tablet Take 1 tablet (25 mg total) by mouth daily. 30 tablet 11  . metFORMIN (GLUCOPHAGE) 1000 MG tablet Take 500 mg by mouth 2 (two) times daily with a meal.  6  . metoprolol tartrate (LOPRESSOR) 25 MG tablet Take 1 tablet (25 mg total) by mouth 2 (two) times daily. 60 tablet 11  . nitroGLYCERIN (NITROSTAT) 0.4 MG SL tablet Place 1 tablet (0.4 mg total) under the tongue every 5 (five) minutes x 3  doses as needed for chest pain. 25 tablet 11  . testosterone cypionate (DEPOTESTOSTERONE CYPIONATE) 200 MG/ML injection Inject 200 mg into the muscle every 14 (fourteen) days.  0  . ticagrelor (BRILINTA) 90 MG TABS tablet Take 1 tablet (90 mg total) by mouth 2 (two) times daily. 60 tablet 0  . zolpidem (AMBIEN) 10 MG tablet Take 10 mg by mouth at bedtime as needed for sleep.     . isosorbide mononitrate (IMDUR) 30 MG 24 hr tablet Take 0.5 tablets (15 mg total) by mouth daily. 15 tablet 11   No current facility-administered medications for this visit.     Allergies:   Patient has no known allergies.   Social History:  The patient  reports that he quit smoking about 5 months ago. He smoked 1.00 pack per day. he has never used smokeless tobacco. He reports that he drinks alcohol. He reports that he does not use drugs.   Family History:  The patient's family history includes Hypertension in his brother, father, and mother.    ROS:  Please see the history of present illness.  Otherwise, review of systems is positive for chills, orthopnea, abdominal pain, diarrhea (probably related to metformin), muscle pain, easy bruising, leg pain, snoring, nausea, joint swelling, headaches.  All other systems are reviewed and negative.    PHYSICAL EXAM: VS:  BP (!) 142/80   Pulse 69   Ht 5\' 8"  (1.727 m)   Wt 183 lb (83 kg)   BMI 27.83 kg/m  , BMI Body mass index is 27.83 kg/m. GEN: Well nourished, well developed, in no acute distress  HEENT: normal  Neck: no JVD, no masses. No carotid bruits Cardiac: RRR without murmur or gallop                Respiratory:  clear to auscultation bilaterally, normal work of breathing GI: soft, nontender, nondistended, + BS MS: no deformity or atrophy  Ext: no pretibial edema, pedal pulses 2+= bilaterally Skin: warm and dry, no rash Neuro:  Strength and sensation are intact Psych: euthymic mood, full affect  EKG:  EKG is ordered today. The ekg ordered today shows  normal sinus rhythm 69 bpm, age-indeterminate inferoposterior infarct.  Recent Labs: 03/25/2017: B Natriuretic Peptide 132.8 03/26/2017: Hemoglobin 13.5; Magnesium 1.6; Platelets 143 05/13/2017: ALT 23; BUN 19; Creatinine, Ser 1.24; Potassium 4.3; Sodium 141   Lipid Panel     Component Value Date/Time   CHOL 81 (L) 05/13/2017 0749   TRIG 82 05/13/2017 0749   HDL 24 (L) 05/13/2017 0749   CHOLHDL 3.4 05/13/2017 0749   CHOLHDL 6.5 03/25/2017 0427   VLDL 46 (H) 03/25/2017 0427   LDLCALC 41 05/13/2017 0749      Wt Readings from Last 3 Encounters:  09/16/17 183 lb (83 kg)  05/13/17  183 lb (83 kg)  04/26/17 184 lb (83.5 kg)     Cardiac Studies Reviewed: Cardiac Cath 03-25-2017: Conclusion   1. Acute inferoposterior STEMI secondary to thrombotic occlusion of the mid left circumflex 2. Moderate LAD and RCA stenosis 3. Elevated LVEDP consistent with acute systolic heart failure 4. Complete heart block treated with temporary transvenous pacemaker insertion 5. Successful primary PCI the left circumflex using a 3.5 x 24 mm Promus DES  Recommendations:  Angiomax should continue for 2 hours. The patient was loaded with brilinta 180 mg prior to intubation  Would leave femoral arterial and venous sheaths in place until tomorrow morning. If his heart rhythm is stable the temporary pacemaker and arterial sheath could be removed at that time  CCM is called to help manage the ventilator. Appreciate their help.  Indications   ST elevation myocardial infarction involving left circumflex coronary artery (HCC) [I21.21 (ICD-10-CM)]  Procedural Details/Technique   Technical Details INDICATION: Acute inferior STEMI. 62 year old gentleman presented to [facility] with chest pain. His initial EKG was nondiagnostic but his chest pain increased and a repeat EKG showed marked inferior ST elevation was complete heart block. He was transferred emergently by EMS for cardiac catheterization. On  arrival the patient is very uncomfortable and we proceeded emergently with cardiac catheterization. Emergency implied consent was obtained.  PROCEDURAL DETAILS:  The right groin was prepped, draped, and anesthetized with 1% lidocaine. Using modified Seldinger technique, a 6 French sheath was introduced into the right femoral artery and a 6 French sheath was introduced into the right femoral vein. Both vessels were accessed with a front wall puncture. A balloon tipped temporary transvenous pacing wire is advanced into the RV apex and the heart is paced at 70 bpm. Standard Judkins catheters were used for coronary angiography and left ventriculography. PCI of the circumflex is performed. Catheter exchanges were performed over an 0.035 Guidewire.  The patient was agitated throughout the procedure. He could not lie still. He initially was only sedated with 1 mg of Versed and 25 g of fentanyl. Even prior to receiving any medication we had a difficult time keeping him from sitting upright. After the PCI was completed, we called anesthesia to intubate the patient because of ongoing movements and concern for vascular injury with need for an indwelling sheath in the femoral artery and a temporary transvenous pacing wire in the femoral vein.    Estimated blood loss <50 mL.  During this procedure the patient was administered the following to achieve and maintain moderate conscious sedation: Versed 3 mg, Fentanyl 25 mcg, while the patient's heart rate, blood pressure, and oxygen saturation were continuously monitored. The period of conscious sedation was 47 minutes, of which I was present face-to-face 100% of this time.  Coronary Findings   Diagnostic  Dominance: Co-dominant  Left Main  Left main is calcified but patent without significant stenosis.  Left Anterior Descending  Prox LAD lesion 50% stenosed  The lesion is mildly calcified.  Left Circumflex  Mid Cx to Dist Cx lesion 99% stenosed  TIMI-1 flow  at baseline  First Obtuse Marginal Branch  Ost 1st Mrg lesion 50% stenosed  Ostial lesion  Right Coronary Artery  Relatively small vessel, mild calcification in the right coronary cusp.  Mid RCA lesion 50% stenosed  Mid RCA lesion.  Intervention   Mid Cx to Dist Cx lesion  Angioplasty  Lesion crossed with guidewire using a WIRE COUGAR XT STRL 190CM. A STENT PROMUS PREM MR 3.5X24 drug eluting  stent was successfully placed. Post-stent angioplasty was not performed. The pre-interventional distal flow is decreased (TIMI 1). The post-interventional distal flow is normal (TIMI 3). The intervention was successful . No complications occurred at this lesion. A cougar wire was used to cross the lesion. The lesion is predilated with a 2.5 mm balloon, stented with a 3.5 x 24 mm Promus DES. Following stenting, there is no-reflow. Intracoronary verapamil is administered and the flow is improved to TIMI 3.  There is no residual stenosis post intervention.  Wall Motion              Left Heart   Left Ventricle There are LV function abnormalities due to segmental dysfunction. There is severe hypokinesis of the inferior wall. The remaining LV wall segments are not well visualized. The LVEF cannot be determined on the basis of this study.  Coronary Diagrams   Diagnostic Diagram       Post-Intervention Diagram        Echo 03-25-2017: Study Conclusions  - Left ventricle: The cavity size was normal. Systolic function was   normal. The estimated ejection fraction was in the range of 60%   to 65%. Wall motion was normal; there were no regional wall   motion abnormalities. Left ventricular diastolic function   parameters were normal. - Aortic valve: Transvalvular velocity was within the normal range.   There was no stenosis. There was trivial regurgitation. - Mitral valve: Transvalvular velocity was within the normal range.   There was no evidence for stenosis. There was no regurgitation. -  Right ventricle: The cavity size was normal. Wall thickness was   normal. Systolic function was normal. - Tricuspid valve: There was trivial regurgitation. - Pulmonary arteries: Systolic pressure was within the normal   range. PA peak pressure: 39 mm Hg (S). - Pericardium, extracardiac: A trivial pericardial effusion was   identified.  ASSESSMENT AND PLAN: 1.  Coronary artery disease, native vessel, with angina (CCS Class 3): The patient has a history of ST elevation infarction in July 2018.  He now has recurrent angina with exertion and at rest.  His symptoms are highly typical for ischemic heart pain.  I think it would be difficult to interpret a nuclear stress test after his MI and with such typical symptoms, I would favor definitive evaluation with cardiac catheterization and possible PCI.  The patient is instructed to hold his metformin prior to the procedure.  He will continue all of his other medications which include dual antiplatelet therapy with aspirin and ticagrelor, a high intensity statin drug with atorvastatin 80 mg, losartan, and metoprolol.  Will add isosorbide 15 mg daily.  I have reviewed the risks, indications, and alternatives to cardiac catheterization, possible angioplasty, and stenting with the patient. Risks include but are not limited to bleeding, infection, vascular injury, stroke, myocardial infection, arrhythmia, kidney injury, radiation-related injury in the case of prolonged fluoroscopy use, emergency cardiac surgery, and death. The patient understands the risks of serious complication is 1-2 in 3086 with diagnostic cardiac cath and 1-2% or less with angioplasty/stenting.   2.  Hyperlipidemia: Most recent lipids reviewed.  The total cholesterol is 81, HDL 24, LDL 41, triglycerides 82.  Treated with atorvastatin 80 mg.  3.  Hypertension: Blood pressure is treated with losartan and metoprolol.  Isosorbide added today.  Current medicines are reviewed with the patient  today.  The patient does not have concerns regarding medicines.  Labs/ tests ordered today include:   Orders Placed This Encounter  Procedures  . Basic metabolic panel  . CBC with Differential/Platelet  . INR/PT    Disposition:   FU pending cath results  Signed, Sherren Mocha, MD  09/16/2017 5:57 PM    [address], [place], [place]  [address] Phone: [phone]; Fax: [phone]

## 2017-09-16 NOTE — H&P (View-Only) (Signed)
Cardiology Office Note Date:  09/16/2017   ID:  JLEN WINTLE, DOB Sep 10, 1956, MRN [id number]  PCP:  [name], DO  Cardiologist:  Sherren Mocha, MD    Chief Complaint  Patient presents with  . Chest Pain     History of Present Illness: [name] Foster is a 62 y.o. male who presents for follow-up of coronary artery disease.  The patient presented with an inferoposterior STEMI in July 2018.  He was unstable at the time of presentation and required temporary transvenous pacing and mechanical ventilation.  He underwent PCI of the left circumflex with a drug-eluting stent.  He was noted to have moderate mid RCA and mid LAD stenoses.  His left ventricular systolic function has been well preserved.  The patient has been doing well until approximately 1 week ago when he developed chest pressure and tightness in the substernal region.  He has experienced this with physical exertion that is relieved within about 1 minute of rest.  He has had a few episodes of chest discomfort when he is lying in the supine position.  He denies shortness of breath, edema, heart palpitations, lightheadedness, or syncope.  He has not taken sublingual nitroglycerin.   Past Medical History:  Diagnosis Date  . CAD in native artery    a. inferoposterior STEMI 03/25/17: LHC LM calcified w/o stenosis, pLAD 50%, m-dLCx 99% s/p PCI/DES w/ Promus 3.5 x 24 mm DES, ostial OM1 50%, small RCA with mRCA 50%  . Diabetes mellitus without complication ([place])   . High cholesterol   . Hyperlipidemia   . Hypertension   . Insomnia   . Pulmonary hypertension ([place])    a. TTE 03/25/17; EF 60-65%, nl WM, LV diastolic fxn nl, trivial AI, RV cavity size, wall thickness, and sys fxn nl, trivial TR, PASP 39 mmHg, trivial pericardial effusion  . Sleep apnea    could not use a cpap  . Wears dentures    full top-partial bottom  . Wears glasses     Past Surgical History:  Procedure Laterality Date  . COLONOSCOPY    .  CORONARY/GRAFT ACUTE MI REVASCULARIZATION N/A 03/25/2017   Procedure: Coronary/Graft Acute MI Revascularization;  Surgeon: Sherren Mocha, MD;  Location: [facility];  Service: Cardiovascular;  Laterality: N/A;  . GANGLION CYST EXCISION Left 07/25/2014   Procedure: EXCISION VOLAR AND DORSAL GANGLION LEFT WRIST;  Surgeon: Leanora Cover, MD;  Location: [facility];  Service: Orthopedics;  Laterality: Left;  . KNEE ARTHROSCOPY     rifgr and left  . LEFT HEART CATH AND CORONARY ANGIOGRAPHY N/A 03/25/2017   Procedure: Left Heart Cath and Coronary Angiography;  Surgeon: Sherren Mocha, MD;  Location: [facility];  Service: Cardiovascular;  Laterality: N/A;  . SHOULDER ARTHROSCOPY  2012   left  . SHOULDER ARTHROSCOPY     right  . TEMPORARY PACEMAKER N/A 03/25/2017   Procedure: Temporary Pacemaker;  Surgeon: Sherren Mocha, MD;  Location: [facility];  Service: Cardiovascular;  Laterality: N/A;  . TONSILLECTOMY    . TOTAL KNEE ARTHROPLASTY  2002   left    Current Outpatient Medications  Medication Sig Dispense Refill  . aspirin EC 81 MG EC tablet Take 1 tablet (81 mg total) by mouth daily. 30 tablet 11  . atorvastatin (LIPITOR) 80 MG tablet Take 1 tablet (80 mg total) by mouth daily at 6 PM. 30 tablet 11  . gabapentin (NEURONTIN) 300 MG capsule Take 300 mg by mouth 3 (  three) times daily as needed (NERVE PAIN).     Marland Kitchen HYDROcodone-acetaminophen (NORCO) 7.5-325 MG tablet Take 1 tablet by mouth every 6 (six) hours as needed for moderate pain.    Marland Kitchen losartan (COZAAR) 25 MG tablet Take 1 tablet (25 mg total) by mouth daily. 30 tablet 11  . metFORMIN (GLUCOPHAGE) 1000 MG tablet Take 500 mg by mouth 2 (two) times daily with a meal.  6  . metoprolol tartrate (LOPRESSOR) 25 MG tablet Take 1 tablet (25 mg total) by mouth 2 (two) times daily. 60 tablet 11  . nitroGLYCERIN (NITROSTAT) 0.4 MG SL tablet Place 1 tablet (0.4 mg total) under the tongue every 5 (five) minutes x 3  doses as needed for chest pain. 25 tablet 11  . testosterone cypionate (DEPOTESTOSTERONE CYPIONATE) 200 MG/ML injection Inject 200 mg into the muscle every 14 (fourteen) days.  0  . ticagrelor (BRILINTA) 90 MG TABS tablet Take 1 tablet (90 mg total) by mouth 2 (two) times daily. 60 tablet 0  . zolpidem (AMBIEN) 10 MG tablet Take 10 mg by mouth at bedtime as needed for sleep.     . isosorbide mononitrate (IMDUR) 30 MG 24 hr tablet Take 0.5 tablets (15 mg total) by mouth daily. 15 tablet 11   No current facility-administered medications for this visit.     Allergies:   Patient has no known allergies.   Social History:  The patient  reports that he quit smoking about 5 months ago. He smoked 1.00 pack per day. he has never used smokeless tobacco. He reports that he drinks alcohol. He reports that he does not use drugs.   Family History:  The patient's family history includes Hypertension in his brother, father, and mother.    ROS:  Please see the history of present illness.  Otherwise, review of systems is positive for chills, orthopnea, abdominal pain, diarrhea (probably related to metformin), muscle pain, easy bruising, leg pain, snoring, nausea, joint swelling, headaches.  All other systems are reviewed and negative.    PHYSICAL EXAM: VS:  BP (!) 142/80   Pulse 69   Ht 5\' 8"  (1.727 m)   Wt 183 lb (83 kg)   BMI 27.83 kg/m  , BMI Body mass index is 27.83 kg/m. GEN: Well nourished, well developed, in no acute distress  HEENT: normal  Neck: no JVD, no masses. No carotid bruits Cardiac: RRR without murmur or gallop                Respiratory:  clear to auscultation bilaterally, normal work of breathing GI: soft, nontender, nondistended, + BS MS: no deformity or atrophy  Ext: no pretibial edema, pedal pulses 2+= bilaterally Skin: warm and dry, no rash Neuro:  Strength and sensation are intact Psych: euthymic mood, full affect  EKG:  EKG is ordered today. The ekg ordered today shows  normal sinus rhythm 69 bpm, age-indeterminate inferoposterior infarct.  Recent Labs: 03/25/2017: B Natriuretic Peptide 132.8 03/26/2017: Hemoglobin 13.5; Magnesium 1.6; Platelets 143 05/13/2017: ALT 23; BUN 19; Creatinine, Ser 1.24; Potassium 4.3; Sodium 141   Lipid Panel     Component Value Date/Time   CHOL 81 (L) 05/13/2017 0749   TRIG 82 05/13/2017 0749   HDL 24 (L) 05/13/2017 0749   CHOLHDL 3.4 05/13/2017 0749   CHOLHDL 6.5 03/25/2017 0427   VLDL 46 (H) 03/25/2017 0427   LDLCALC 41 05/13/2017 0749      Wt Readings from Last 3 Encounters:  09/16/17 183 lb (83 kg)  05/13/17  183 lb (83 kg)  04/26/17 184 lb (83.5 kg)     Cardiac Studies Reviewed: Cardiac Cath 03-25-2017: Conclusion   1. Acute inferoposterior STEMI secondary to thrombotic occlusion of the mid left circumflex 2. Moderate LAD and RCA stenosis 3. Elevated LVEDP consistent with acute systolic heart failure 4. Complete heart block treated with temporary transvenous pacemaker insertion 5. Successful primary PCI the left circumflex using a 3.5 x 24 mm Promus DES  Recommendations:  Angiomax should continue for 2 hours. The patient was loaded with brilinta 180 mg prior to intubation  Would leave femoral arterial and venous sheaths in place until tomorrow morning. If his heart rhythm is stable the temporary pacemaker and arterial sheath could be removed at that time  CCM is called to help manage the ventilator. Appreciate their help.  Indications   ST elevation myocardial infarction involving left circumflex coronary artery (HCC) [I21.21 (ICD-10-CM)]  Procedural Details/Technique   Technical Details INDICATION: Acute inferior STEMI. 62 year old gentleman presented to [facility] with chest pain. His initial EKG was nondiagnostic but his chest pain increased and a repeat EKG showed marked inferior ST elevation was complete heart block. He was transferred emergently by EMS for cardiac catheterization. On  arrival the patient is very uncomfortable and we proceeded emergently with cardiac catheterization. Emergency implied consent was obtained.  PROCEDURAL DETAILS:  The right groin was prepped, draped, and anesthetized with 1% lidocaine. Using modified Seldinger technique, a 6 French sheath was introduced into the right femoral artery and a 6 French sheath was introduced into the right femoral vein. Both vessels were accessed with a front wall puncture. A balloon tipped temporary transvenous pacing wire is advanced into the RV apex and the heart is paced at 70 bpm. Standard Judkins catheters were used for coronary angiography and left ventriculography. PCI of the circumflex is performed. Catheter exchanges were performed over an 0.035 Guidewire.  The patient was agitated throughout the procedure. He could not lie still. He initially was only sedated with 1 mg of Versed and 25 g of fentanyl. Even prior to receiving any medication we had a difficult time keeping him from sitting upright. After the PCI was completed, we called anesthesia to intubate the patient because of ongoing movements and concern for vascular injury with need for an indwelling sheath in the femoral artery and a temporary transvenous pacing wire in the femoral vein.    Estimated blood loss <50 mL.  During this procedure the patient was administered the following to achieve and maintain moderate conscious sedation: Versed 3 mg, Fentanyl 25 mcg, while the patient's heart rate, blood pressure, and oxygen saturation were continuously monitored. The period of conscious sedation was 47 minutes, of which I was present face-to-face 100% of this time.  Coronary Findings   Diagnostic  Dominance: Co-dominant  Left Main  Left main is calcified but patent without significant stenosis.  Left Anterior Descending  Prox LAD lesion 50% stenosed  The lesion is mildly calcified.  Left Circumflex  Mid Cx to Dist Cx lesion 99% stenosed  TIMI-1 flow  at baseline  First Obtuse Marginal Branch  Ost 1st Mrg lesion 50% stenosed  Ostial lesion  Right Coronary Artery  Relatively small vessel, mild calcification in the right coronary cusp.  Mid RCA lesion 50% stenosed  Mid RCA lesion.  Intervention   Mid Cx to Dist Cx lesion  Angioplasty  Lesion crossed with guidewire using a WIRE COUGAR XT STRL 190CM. A STENT PROMUS PREM MR 3.5X24 drug eluting  stent was successfully placed. Post-stent angioplasty was not performed. The pre-interventional distal flow is decreased (TIMI 1). The post-interventional distal flow is normal (TIMI 3). The intervention was successful . No complications occurred at this lesion. A cougar wire was used to cross the lesion. The lesion is predilated with a 2.5 mm balloon, stented with a 3.5 x 24 mm Promus DES. Following stenting, there is no-reflow. Intracoronary verapamil is administered and the flow is improved to TIMI 3.  There is no residual stenosis post intervention.  Wall Motion              Left Heart   Left Ventricle There are LV function abnormalities due to segmental dysfunction. There is severe hypokinesis of the inferior wall. The remaining LV wall segments are not well visualized. The LVEF cannot be determined on the basis of this study.  Coronary Diagrams   Diagnostic Diagram       Post-Intervention Diagram        Echo 03-25-2017: Study Conclusions  - Left ventricle: The cavity size was normal. Systolic function was   normal. The estimated ejection fraction was in the range of 60%   to 65%. Wall motion was normal; there were no regional wall   motion abnormalities. Left ventricular diastolic function   parameters were normal. - Aortic valve: Transvalvular velocity was within the normal range.   There was no stenosis. There was trivial regurgitation. - Mitral valve: Transvalvular velocity was within the normal range.   There was no evidence for stenosis. There was no regurgitation. -  Right ventricle: The cavity size was normal. Wall thickness was   normal. Systolic function was normal. - Tricuspid valve: There was trivial regurgitation. - Pulmonary arteries: Systolic pressure was within the normal   range. PA peak pressure: 39 mm Hg (S). - Pericardium, extracardiac: A trivial pericardial effusion was   identified.  ASSESSMENT AND PLAN: 1.  Coronary artery disease, native vessel, with angina (CCS Class 3): The patient has a history of ST elevation infarction in July 2018.  He now has recurrent angina with exertion and at rest.  His symptoms are highly typical for ischemic heart pain.  I think it would be difficult to interpret a nuclear stress test after his MI and with such typical symptoms, I would favor definitive evaluation with cardiac catheterization and possible PCI.  The patient is instructed to hold his metformin prior to the procedure.  He will continue all of his other medications which include dual antiplatelet therapy with aspirin and ticagrelor, a high intensity statin drug with atorvastatin 80 mg, losartan, and metoprolol.  Will add isosorbide 15 mg daily.  I have reviewed the risks, indications, and alternatives to cardiac catheterization, possible angioplasty, and stenting with the patient. Risks include but are not limited to bleeding, infection, vascular injury, stroke, myocardial infection, arrhythmia, kidney injury, radiation-related injury in the case of prolonged fluoroscopy use, emergency cardiac surgery, and death. The patient understands the risks of serious complication is 1-2 in 4098 with diagnostic cardiac cath and 1-2% or less with angioplasty/stenting.   2.  Hyperlipidemia: Most recent lipids reviewed.  The total cholesterol is 81, HDL 24, LDL 41, triglycerides 82.  Treated with atorvastatin 80 mg.  3.  Hypertension: Blood pressure is treated with losartan and metoprolol.  Isosorbide added today.  Current medicines are reviewed with the patient  today.  The patient does not have concerns regarding medicines.  Labs/ tests ordered today include:   Orders Placed This Encounter  Procedures  . Basic metabolic panel  . CBC with Differential/Platelet  . INR/PT    Disposition:   FU pending cath results  Signed, Sherren Mocha, MD  09/16/2017 5:57 PM    [address], [place], [place]  [address] Phone: [phone]; Fax: [phone]

## 2017-09-17 LAB — BASIC METABOLIC PANEL
BUN/Creatinine Ratio: 11 (ref 10–24)
BUN: 10 mg/dL (ref 8–27)
CALCIUM: 9.4 mg/dL (ref 8.6–10.2)
CHLORIDE: 102 mmol/L (ref 96–106)
CO2: 26 mmol/L (ref 20–29)
Creatinine, Ser: 0.92 mg/dL (ref 0.76–1.27)
GFR calc Af Amer: 103 mL/min/{1.73_m2} (ref >59)
GFR calc non Af Amer: 89 mL/min/{1.73_m2} (ref >59)
Glucose: 213 mg/dL — ABNORMAL HIGH (ref 65–99)
POTASSIUM: 4.3 mmol/L (ref 3.5–5.2)
Sodium: 141 mmol/L (ref 134–144)

## 2017-09-17 LAB — CBC WITH DIFFERENTIAL/PLATELET
BASOS ABS: 0 10*3/uL (ref 0.0–0.2)
Basos: 0 %
EOS (ABSOLUTE): 0.1 10*3/uL (ref 0.0–0.4)
Eos: 2 %
Hematocrit: 41.5 % (ref 37.5–51.0)
Hemoglobin: 13.9 g/dL (ref 13.0–17.7)
IMMATURE GRANS (ABS): 0 10*3/uL (ref 0.0–0.1)
IMMATURE GRANULOCYTES: 0 %
Lymphocytes Absolute: 1.2 10*3/uL (ref 0.7–3.1)
Lymphs: 22 %
MCH: 29.1 pg (ref 26.6–33.0)
MCHC: 33.5 g/dL (ref 31.5–35.7)
MCV: 87 fL (ref 79–97)
Monocytes Absolute: 0.5 10*3/uL (ref 0.1–0.9)
Monocytes: 10 %
NEUTROS PCT: 66 %
Neutrophils Absolute: 3.4 10*3/uL (ref 1.4–7.0)
Platelets: 142 10*3/uL — ABNORMAL LOW (ref 150–379)
RBC: 4.77 x10E6/uL (ref 4.14–5.80)
RDW: 16.4 % — ABNORMAL HIGH (ref 12.3–15.4)
WBC: 5.2 10*3/uL (ref 3.4–10.8)

## 2017-09-17 LAB — PROTIME-INR
INR: 1 (ref 0.8–1.2)
Prothrombin Time: 10.3 s (ref 9.1–12.0)

## 2017-09-19 ENCOUNTER — Other Ambulatory Visit (INDEPENDENT_AMBULATORY_CARE_PROVIDER_SITE_OTHER): Payer: 59

## 2017-09-19 ENCOUNTER — Telehealth: Payer: Self-pay

## 2017-09-19 DIAGNOSIS — I2510 Atherosclerotic heart disease of native coronary artery without angina pectoris: Secondary | ICD-10-CM | POA: Diagnosis not present

## 2017-09-19 NOTE — Telephone Encounter (Signed)
Patient contacted pre-catheterization at [facility] scheduled for:  09/20/2017 @ 1030 Verified arrival time and place:  NT @ 0800 Confirmed AM meds to be taken pre-cath with sip of water: Take ASA/Brilinta Hold metformin-last dose Sunday evening Confirmed patient has responsible person to drive home post procedure and observe patient for 24 hours:  yes Addl concerns:  none

## 2017-09-20 ENCOUNTER — Ambulatory Visit (HOSPITAL_COMMUNITY)
Admission: RE | Disposition: A | Discharge status: Home / Self Care | Payer: Self-pay | Source: Ambulatory Visit | Attending: Cardiology

## 2017-09-20 ENCOUNTER — Ambulatory Visit (HOSPITAL_COMMUNITY)
Admission: RE | Admit: 2017-09-20 | Discharge: 2017-09-20 | Disposition: A | Discharge status: Home / Self Care | Payer: 59 | Source: Ambulatory Visit | Attending: Cardiology | Admitting: Cardiology

## 2017-09-20 DIAGNOSIS — E785 Hyperlipidemia, unspecified: Secondary | ICD-10-CM | POA: Diagnosis not present

## 2017-09-20 DIAGNOSIS — Z7982 Long term (current) use of aspirin: Secondary | ICD-10-CM | POA: Diagnosis not present

## 2017-09-20 DIAGNOSIS — I25110 Atherosclerotic heart disease of native coronary artery with unstable angina pectoris: Secondary | ICD-10-CM | POA: Insufficient documentation

## 2017-09-20 DIAGNOSIS — I2510 Atherosclerotic heart disease of native coronary artery without angina pectoris: Secondary | ICD-10-CM | POA: Diagnosis present

## 2017-09-20 DIAGNOSIS — G4730 Sleep apnea, unspecified: Secondary | ICD-10-CM | POA: Diagnosis not present

## 2017-09-20 DIAGNOSIS — E119 Type 2 diabetes mellitus without complications: Secondary | ICD-10-CM | POA: Diagnosis not present

## 2017-09-20 DIAGNOSIS — Z87891 Personal history of nicotine dependence: Secondary | ICD-10-CM | POA: Diagnosis not present

## 2017-09-20 DIAGNOSIS — I209 Angina pectoris, unspecified: Secondary | ICD-10-CM | POA: Diagnosis present

## 2017-09-20 DIAGNOSIS — Z79899 Other long term (current) drug therapy: Secondary | ICD-10-CM | POA: Insufficient documentation

## 2017-09-20 DIAGNOSIS — I10 Essential (primary) hypertension: Secondary | ICD-10-CM | POA: Diagnosis not present

## 2017-09-20 DIAGNOSIS — G4700 Insomnia, unspecified: Secondary | ICD-10-CM | POA: Insufficient documentation

## 2017-09-20 DIAGNOSIS — Z7902 Long term (current) use of antithrombotics/antiplatelets: Secondary | ICD-10-CM | POA: Insufficient documentation

## 2017-09-20 DIAGNOSIS — Z955 Presence of coronary angioplasty implant and graft: Secondary | ICD-10-CM | POA: Diagnosis not present

## 2017-09-20 DIAGNOSIS — I252 Old myocardial infarction: Secondary | ICD-10-CM | POA: Insufficient documentation

## 2017-09-20 DIAGNOSIS — Z7984 Long term (current) use of oral hypoglycemic drugs: Secondary | ICD-10-CM | POA: Insufficient documentation

## 2017-09-20 DIAGNOSIS — Z96652 Presence of left artificial knee joint: Secondary | ICD-10-CM | POA: Insufficient documentation

## 2017-09-20 HISTORY — PX: LEFT HEART CATH AND CORONARY ANGIOGRAPHY: CATH118249

## 2017-09-20 HISTORY — PX: CORONARY PRESSURE/FFR STUDY: CATH118243

## 2017-09-20 HISTORY — PX: CORONARY STENT INTERVENTION: CATH118234

## 2017-09-20 LAB — POCT ACTIVATED CLOTTING TIME
ACTIVATED CLOTTING TIME: 230 s
ACTIVATED CLOTTING TIME: 230 s
ACTIVATED CLOTTING TIME: 329 s

## 2017-09-20 LAB — GLUCOSE, CAPILLARY
Glucose-Capillary: 128 mg/dL — ABNORMAL HIGH (ref 65–99)
Glucose-Capillary: 92 mg/dL (ref 65–99)

## 2017-09-20 SURGERY — LEFT HEART CATH AND CORONARY ANGIOGRAPHY
Anesthesia: LOCAL

## 2017-09-20 MED ORDER — SODIUM CHLORIDE 0.9% FLUSH: 3.0000 mL | Freq: Two times a day (BID) | INTRAVENOUS | Status: DC

## 2017-09-20 MED ORDER — HEPARIN SODIUM (PORCINE) 1000 UNIT/ML IJ SOLN
INTRAMUSCULAR | Status: AC
  Filled 2017-09-20: qty 1

## 2017-09-20 MED ORDER — SODIUM CHLORIDE 0.9 % IV SOLN
250.0000 mL | INTRAVENOUS | Status: DC | PRN
Start: 2017-09-20 — End: 2017-09-20

## 2017-09-20 MED ORDER — IOPAMIDOL (ISOVUE-370) INJECTION 76%
INTRAVENOUS | Status: AC
  Filled 2017-09-20: qty 50

## 2017-09-20 MED ORDER — SODIUM CHLORIDE 0.9% FLUSH: 3.0000 mL | INTRAVENOUS | Status: DC | PRN

## 2017-09-20 MED ORDER — HEPARIN SODIUM (PORCINE) 1000 UNIT/ML IJ SOLN
INTRAMUSCULAR | Status: DC | PRN
  Administered 2017-09-20: 4000 [IU] via INTRAVENOUS
  Administered 2017-09-20: 3000 [IU] via INTRAVENOUS
  Administered 2017-09-20: 1500 [IU] via INTRAVENOUS
  Administered 2017-09-20: 4000 [IU] via INTRAVENOUS

## 2017-09-20 MED ORDER — ZOLPIDEM TARTRATE 10 MG PO TABS: 10.0000 mg | ORAL_TABLET | Freq: Every evening | ORAL | Status: DC | PRN

## 2017-09-20 MED ORDER — LIDOCAINE HCL (PF) 1 % IJ SOLN
INTRAMUSCULAR | Status: AC
  Filled 2017-09-20: qty 30

## 2017-09-20 MED ORDER — FENTANYL CITRATE (PF) 100 MCG/2ML IJ SOLN
INTRAMUSCULAR | Status: DC | PRN
  Administered 2017-09-20: 50 ug via INTRAVENOUS

## 2017-09-20 MED ORDER — HYDRALAZINE HCL 20 MG/ML IJ SOLN
5.0000 mg | INTRAMUSCULAR | Status: AC | PRN
  Administered 2017-09-20 (×2): 5 mg via INTRAVENOUS

## 2017-09-20 MED ORDER — NITROGLYCERIN 1 MG/10 ML FOR IR/CATH LAB
INTRA_ARTERIAL | Status: AC
  Filled 2017-09-20: qty 10

## 2017-09-20 MED ORDER — ANGIOPLASTY BOOK
Freq: Once | Status: DC
  Filled 2017-09-20: qty 1

## 2017-09-20 MED ORDER — SODIUM CHLORIDE 0.9 % IV SOLN: 250.0000 mL | INTRAVENOUS | Status: DC | PRN

## 2017-09-20 MED ORDER — ISOSORBIDE MONONITRATE 15 MG HALF TABLET: 15.0000 mg | ORAL_TABLET | Freq: Every day | ORAL | Status: DC

## 2017-09-20 MED ORDER — SODIUM CHLORIDE 0.9 % WEIGHT BASED INFUSION: 1.0000 mL/kg/h | INTRAVENOUS | Status: DC

## 2017-09-20 MED ORDER — LIDOCAINE HCL (PF) 1 % IJ SOLN
INTRAMUSCULAR | Status: DC | PRN
  Administered 2017-09-20: 4 mL via INTRADERMAL

## 2017-09-20 MED ORDER — VERAPAMIL HCL 2.5 MG/ML IV SOLN
INTRAVENOUS | Status: AC
  Filled 2017-09-20: qty 2

## 2017-09-20 MED ORDER — HEPARIN (PORCINE) IN NACL 2-0.9 UNIT/ML-% IJ SOLN
INTRAMUSCULAR | Status: AC
  Filled 2017-09-20: qty 1000

## 2017-09-20 MED ORDER — VERAPAMIL HCL 2.5 MG/ML IV SOLN
INTRAVENOUS | Status: DC | PRN
  Administered 2017-09-20: 10:00:00 via INTRA_ARTERIAL

## 2017-09-20 MED ORDER — HYDRALAZINE HCL 20 MG/ML IJ SOLN
INTRAMUSCULAR | Status: AC
  Filled 2017-09-20: qty 1

## 2017-09-20 MED ORDER — ACETAMINOPHEN 325 MG PO TABS: 650.0000 mg | ORAL_TABLET | ORAL | Status: DC | PRN

## 2017-09-20 MED ORDER — FENTANYL CITRATE (PF) 100 MCG/2ML IJ SOLN
INTRAMUSCULAR | Status: AC
  Filled 2017-09-20: qty 2

## 2017-09-20 MED ORDER — MORPHINE SULFATE (PF) 10 MG/ML IV SOLN: 2.0000 mg | INTRAVENOUS | Status: DC | PRN

## 2017-09-20 MED ORDER — SODIUM CHLORIDE 0.9 % IV SOLN: INTRAVENOUS | Status: AC

## 2017-09-20 MED ORDER — IOPAMIDOL (ISOVUE-370) INJECTION 76%
INTRAVENOUS | Status: DC | PRN
  Administered 2017-09-20: 200 mL via INTRA_ARTERIAL

## 2017-09-20 MED ORDER — ASPIRIN 81 MG PO CHEW: 81.0000 mg | CHEWABLE_TABLET | ORAL | Status: DC

## 2017-09-20 MED ORDER — TICAGRELOR 90 MG PO TABS: 90.0000 mg | ORAL_TABLET | Freq: Two times a day (BID) | ORAL | Status: DC

## 2017-09-20 MED ORDER — ATORVASTATIN CALCIUM 80 MG PO TABS: 80.0000 mg | ORAL_TABLET | Freq: Every day | ORAL | Status: DC

## 2017-09-20 MED ORDER — GABAPENTIN 300 MG PO CAPS: 300.0000 mg | ORAL_CAPSULE | Freq: Three times a day (TID) | ORAL | Status: DC | PRN

## 2017-09-20 MED ORDER — ADENOSINE 12 MG/4ML IV SOLN
INTRAVENOUS | Status: AC
  Filled 2017-09-20: qty 16

## 2017-09-20 MED ORDER — ONDANSETRON HCL 4 MG/2ML IJ SOLN: 4.0000 mg | Freq: Four times a day (QID) | INTRAMUSCULAR | Status: DC | PRN

## 2017-09-20 MED ORDER — HEPARIN (PORCINE) IN NACL 2-0.9 UNIT/ML-% IJ SOLN
INTRAMUSCULAR | Status: AC | PRN
  Administered 2017-09-20: 1000 mL via INTRA_ARTERIAL

## 2017-09-20 MED ORDER — ASPIRIN 81 MG PO TBEC: 81.0000 mg | DELAYED_RELEASE_TABLET | Freq: Every day | ORAL | Status: DC

## 2017-09-20 MED ORDER — NITROGLYCERIN 1 MG/10 ML FOR IR/CATH LAB
INTRA_ARTERIAL | Status: DC | PRN
  Administered 2017-09-20 (×2): 200 ug via INTRACORONARY

## 2017-09-20 MED ORDER — MIDAZOLAM HCL 2 MG/2ML IJ SOLN
INTRAMUSCULAR | Status: AC
  Filled 2017-09-20: qty 2

## 2017-09-20 MED ORDER — IOPAMIDOL (ISOVUE-370) INJECTION 76%
INTRAVENOUS | Status: AC
  Filled 2017-09-20: qty 100

## 2017-09-20 MED ORDER — HYDROCODONE-ACETAMINOPHEN 7.5-325 MG PO TABS: 1.0000 | ORAL_TABLET | Freq: Four times a day (QID) | ORAL | Status: DC | PRN

## 2017-09-20 MED ORDER — METOPROLOL TARTRATE 25 MG PO TABS: 25.0000 mg | ORAL_TABLET | Freq: Two times a day (BID) | ORAL | Status: DC

## 2017-09-20 MED ORDER — LOSARTAN POTASSIUM 25 MG PO TABS: 25.0000 mg | ORAL_TABLET | Freq: Every day | ORAL | Status: DC

## 2017-09-20 MED ORDER — ADENOSINE (DIAGNOSTIC) 140MCG/KG/MIN
INTRAVENOUS | Status: DC | PRN
  Administered 2017-09-20: 140 ug/kg/min via INTRAVENOUS

## 2017-09-20 MED ORDER — MIDAZOLAM HCL 2 MG/2ML IJ SOLN
INTRAMUSCULAR | Status: DC | PRN
  Administered 2017-09-20: 2 mg via INTRAVENOUS

## 2017-09-20 MED ORDER — LABETALOL HCL 5 MG/ML IV SOLN: 10.0000 mg | INTRAVENOUS | Status: AC | PRN

## 2017-09-20 MED ORDER — SODIUM CHLORIDE 0.9 % WEIGHT BASED INFUSION
3.0000 mL/kg/h | INTRAVENOUS | Status: AC
  Administered 2017-09-20: 3 mL/kg/h via INTRAVENOUS

## 2017-09-20 MED ORDER — TICAGRELOR 90 MG PO TABS: 90.0000 mg | ORAL_TABLET | ORAL | Status: DC

## 2017-09-20 MED ORDER — NITROGLYCERIN 0.4 MG SL SUBL: 0.4000 mg | SUBLINGUAL_TABLET | SUBLINGUAL | Status: DC | PRN

## 2017-09-20 SURGICAL SUPPLY — 19 items
BALLN SAPPHIRE 2.0X15 (BALLOONS) ×2
BALLOON SAPPHIRE 2.0X15 (BALLOONS) ×1 IMPLANT
CATH INFINITI 5 FR JL3.5 (CATHETERS) ×2 IMPLANT
CATH MICROCATH NAVVUS (MICROCATHETER) ×1 IMPLANT
CATH OPTITORQUE TIG 4.0 5F (CATHETERS) ×2 IMPLANT
CATH VISTA GUIDE 6FR XB3.5 (CATHETERS) ×2 IMPLANT
DEVICE RAD COMP TR BAND LRG (VASCULAR PRODUCTS) ×2 IMPLANT
GLIDESHEATH SLEND A-KIT 6F 22G (SHEATH) ×2 IMPLANT
GUIDEWIRE INQWIRE 1.5J.035X260 (WIRE) ×1 IMPLANT
INQWIRE 1.5J .035X260CM (WIRE) ×2
KIT ENCORE 26 ADVANTAGE (KITS) ×2 IMPLANT
KIT HEART LEFT (KITS) ×2 IMPLANT
MICROCATHETER NAVVUS (MICROCATHETER) ×2
PACK CARDIAC CATHETERIZATION (CUSTOM PROCEDURE TRAY) ×2 IMPLANT
STENT SYNERGY DES 2.25X28 (Permanent Stent) ×2 IMPLANT
TRANSDUCER W/STOPCOCK (MISCELLANEOUS) ×2 IMPLANT
TUBING CIL FLEX 10 FLL-RA (TUBING) ×2 IMPLANT
VALVE GUARDIAN II ~~LOC~~ HEMO (MISCELLANEOUS) ×2 IMPLANT
WIRE MARVEL STR TIP 190CM (WIRE) ×2 IMPLANT

## 2017-09-20 NOTE — Discharge Summary (Signed)
Discharge Summary    Patient ID: [name] Foster,  MRN: [id number], DOB/AGE: 13-Dec-1955 62 y.o.  Admit date: 09/20/2017 Discharge date: 09/20/2017  Primary Care Provider: [name] Primary Cardiologist: Burt Knack  Discharge Diagnoses    Principal Problem:   Angina, class III [facility]) Active Problems:   History of ST elevation myocardial infarction (STEMI)   Coronary artery disease involving native coronary artery of native heart with unstable angina pectoris ([place])   Presence of drug-eluting stent in left circumflex coronary artery   Allergies No Known Allergies  Diagnostic Studies/Procedures    Cath: 09/20/17  Conclusion     LPDA lesion is 90% stenosed. Previously nonvisualized vessel.  A drug-eluting stent was successfully placed using a STENT SYNERGY DES 2.25X28. --> Postdilated 2.4 mm  Post intervention, there is a 0% residual stenosis.  Ost LPDA lesion is 60% stenosed. Vessel not previously visualized during an index PCI case. -- Treated with PTCA alone  Balloon angioplasty was performed using a BALLOON SAPPHIRE 2.0X15. Post intervention, there is a 40% residual stenosis.  Previously placed Mid Cx stent (unknown type) is widely patent.  Prox LAD lesion is 65% stenosed. FFR NOT Significant (0.83)  Ost 2nd Mrg to 2nd Mrg lesion is 60% stenosed.  Non-dominant vessel Mid RCA lesion is 50% stenosed with 50% stenosed side branch in Acute Mrg.  The left ventricular systolic function is normal. The left ventricular ejection fraction is 55-65% by visual estimate.   Successful PCI of left posterior descending artery which was not previously visualized using a Synergy DES stent. Mild PTCA of the ostium of the PDA to allow for stent advancement.  Moderate residual disease noted.  Moderate severe disease in the proximal-midportion of the LAD but FFR not quite significant (0.83).  Otherwise preserved LVEF.  No other obvious culprit lesions noted.  Plan: Relatively  stable post PCI.  He should be stable for same day discharge.  He is already on aspirin and Brilinta  We will continue home medications  Hold metformin for 48 hours post PCI  Follow-up with Dr. Burt Knack as directed.   _____________   History of Present Illness     62 y.o. male who presented to the office for follow-up of coronary artery disease. The patient presented with an inferoposterior STEMI in July 2018.  He was unstable at the time of presentation and required temporary transvenous pacing and mechanical ventilation.  He underwent PCI of the left circumflex with a drug-eluting stent.  He was noted to have moderate mid RCA and mid LAD stenoses.  His left ventricular systolic function has been well preserved.  The patient has been doing well until approximately 1 week prior to office visit when he developed chest pressure and tightness in the substernal region.  He has experienced this with physical exertion that was relieved within about 1 minute of rest.  He has had a few episodes of chest discomfort when he is lying in the supine position.  He denied shortness of breath, edema, heart palpitations, lightheadedness, or syncope. Given his symptoms he was set up for outpatient cardiac cath. Also had Imdur added at this last office visit.   Hospital Course     Underwent cardiac cath with Dr. Ellyn Hack noted above with successful PCI of the left posterior descending artery with DESx1. Also noted to have moderate disease in the LAD but was not significant via FFR (0.83). Plan to continue on DAPT with ASA/Brilinta. He was seen by cardiac rehab. Instructions/restrictions given prior  to discharge. Radial cath site stable. Of note did ask patient to hold on resuming his testosterone injections until seen by cardiologist in follow up.  [name] Foster was seen by Dr. Ellyn Hack and determined stable for discharge home. Follow up in the office has been arranged. Medications are listed below.    _____________  Discharge Vitals Blood pressure (!) 156/67, pulse 72, temperature 97.8 F (36.6 C), temperature source Oral, resp. rate (!) 23, height 5\' 8"  (1.727 m), weight 183 lb (83 kg), SpO2 99 %.  Filed Weights   09/20/17 0801  Weight: 183 lb (83 kg)    Labs & Radiologic Studies    CBC No results for input(s): WBC, NEUTROABS, HGB, HCT, MCV, PLT in the last 72 hours. Basic Metabolic Panel No results for input(s): NA, K, CL, CO2, GLUCOSE, BUN, CREATININE, CALCIUM, MG, PHOS in the last 72 hours. Liver Function Tests No results for input(s): AST, ALT, ALKPHOS, BILITOT, PROT, ALBUMIN in the last 72 hours. No results for input(s): LIPASE, AMYLASE in the last 72 hours. Cardiac Enzymes No results for input(s): CKTOTAL, CKMB, CKMBINDEX, TROPONINI in the last 72 hours. BNP Invalid input(s): POCBNP D-Dimer No results for input(s): DDIMER in the last 72 hours. Hemoglobin A1C No results for input(s): HGBA1C in the last 72 hours. Fasting Lipid Panel No results for input(s): CHOL, HDL, LDLCALC, TRIG, CHOLHDL, LDLDIRECT in the last 72 hours. Thyroid Function Tests No results for input(s): TSH, T4TOTAL, T3FREE, THYROIDAB in the last 72 hours.  Invalid input(s): FREET3 _____________  No results found. Disposition   Pt is being discharged home today in good condition.  Follow-up Plans & Appointments    Follow-up Information    Harts, Crista Luria, [place] Follow up on 09/28/2017.   Specialty:  Cardiology Why:  at 11am for your follow up appt.  Contact information: [place] [address] [phone]          Discharge Instructions    Amb Referral to Cardiac Rehabilitation   Complete by:  As directed    Diagnosis:  Coronary Stents      Discharge Medications     Medication List    STOP taking these medications   testosterone cypionate 200 MG/ML injection Commonly known as:  DEPOTESTOSTERONE CYPIONATE     TAKE these medications   AMBIEN 10  MG tablet Generic drug:  zolpidem Take 10 mg by mouth at bedtime as needed for sleep.   aspirin 81 MG EC tablet Take 1 tablet (81 mg total) by mouth daily.   atorvastatin 80 MG tablet Commonly known as:  LIPITOR Take 1 tablet (80 mg total) by mouth daily at 6 PM.   gabapentin 300 MG capsule Commonly known as:  NEURONTIN Take 300 mg by mouth 3 (three) times daily as needed (NERVE PAIN).   HYDROcodone-acetaminophen 7.5-325 MG tablet Commonly known as:  NORCO Take 1 tablet by mouth every 6 (six) hours as needed for moderate pain.   isosorbide mononitrate 30 MG 24 hr tablet Commonly known as:  IMDUR Take 0.5 tablets (15 mg total) by mouth daily.   losartan 25 MG tablet Commonly known as:  COZAAR Take 1 tablet (25 mg total) by mouth daily.   metFORMIN 1000 MG tablet Commonly known as:  GLUCOPHAGE Take 500 mg by mouth 2 (two) times daily with a meal.   metoprolol tartrate 25 MG tablet Commonly known as:  LOPRESSOR Take 1 tablet (25 mg total) by mouth 2 (two) times daily.   nitroGLYCERIN  0.4 MG SL tablet Commonly known as:  NITROSTAT Place 1 tablet (0.4 mg total) under the tongue every 5 (five) minutes x 3 doses as needed for chest pain.   ticagrelor 90 MG Tabs tablet Commonly known as:  BRILINTA Take 1 tablet (90 mg total) by mouth 2 (two) times daily.        Aspirin prescribed at discharge?  Yes High Intensity Statin Prescribed? (Lipitor 40-80mg  or Crestor 20-40mg ): Yes Beta Blocker Prescribed? Yes For EF <40%, was ACEI/ARB Prescribed? Yes ADP Receptor Inhibitor Prescribed? (i.e. Plavix etc.-Includes Medically Managed Patients): Yes For EF <40%, Aldosterone Inhibitor Prescribed? No: EF ok Was EF assessed during THIS hospitalization? Yes Was Cardiac Rehab II ordered? (Included Medically managed Patients): Yes   Outstanding Labs/Studies   N/a   Duration of Discharge Encounter   Greater than 30 minutes including physician time.  Signed, Nysir Fergusson  NP-C 09/20/2017, 4:11 PM

## 2017-09-20 NOTE — Progress Notes (Signed)
Small hematoma developed above the radial band. Pressure held to hematoma x 5 minutes. Site level 0 after pressure held.

## 2017-09-20 NOTE — Interval H&P Note (Signed)
History and Physical Interval Note:  09/20/2017 9:33 AM  [name] Foster  has presented today for surgery, with the diagnosis of Class III Angina.  The various methods of treatment have been discussed with the patient and family. After consideration of risks, benefits and other options for treatment, the patient has consented to  Procedure(s): LEFT HEART CATH AND CORONARY ANGIOGRAPHY (N/A) with possible PERCUTANEOUS CORONARY INTERVENTION as a surgical intervention .  The patient's history has been reviewed, patient examined, no change in status, stable for surgery.  I have reviewed the patient's chart and labs.  Questions were answered to the patient's satisfaction.    Cath Lab Visit (complete for each Cath Lab visit)  Clinical Evaluation Leading to the Procedure:   ACS: No.  Non-ACS:    Anginal Classification: CCS III  Anti-ischemic medical therapy: Maximal Therapy (2 or more classes of medications)  Non-Invasive Test Results: No non-invasive testing performed  Prior CABG: No previous CABG   Glenetta Hew

## 2017-09-20 NOTE — Progress Notes (Signed)
[phone] Pt seen by me last admission. Reviewed NTG use, risk factors, ex ed, heart healthy and low carb food choices, brilinta use and CRP 2. Pt did not attend CRP 2 due to high copay. Will refer again to [facility] in case insurance requirements have changed. Pt quit smoking with the nicorette gum. Congratulated him on this. He has been walking for ex. Graylon Good RN BSN 09/20/2017 2:39 PM

## 2017-09-20 NOTE — Discharge Instructions (Signed)

## 2017-09-21 ENCOUNTER — Encounter (HOSPITAL_COMMUNITY): Payer: Self-pay | Admitting: Cardiology

## 2017-09-21 MED FILL — Heparin Sodium (Porcine) Inj 1000 Unit/ML: INTRAMUSCULAR | Qty: 10 | Status: AC

## 2017-09-28 ENCOUNTER — Ambulatory Visit: Payer: 59 | Admitting: Physician Assistant

## 2017-09-28 ENCOUNTER — Encounter: Payer: Self-pay | Admitting: Physician Assistant

## 2017-09-28 VITALS — BP 132/64 | HR 65 | Resp 16 | Ht 69.0 in | Wt 180.3 lb

## 2017-09-28 DIAGNOSIS — E785 Hyperlipidemia, unspecified: Secondary | ICD-10-CM

## 2017-09-28 DIAGNOSIS — I252 Old myocardial infarction: Secondary | ICD-10-CM

## 2017-09-28 DIAGNOSIS — I10 Essential (primary) hypertension: Secondary | ICD-10-CM

## 2017-09-28 DIAGNOSIS — I25119 Atherosclerotic heart disease of native coronary artery with unspecified angina pectoris: Secondary | ICD-10-CM | POA: Diagnosis not present

## 2017-09-28 DIAGNOSIS — E119 Type 2 diabetes mellitus without complications: Secondary | ICD-10-CM

## 2017-09-28 DIAGNOSIS — E109 Type 1 diabetes mellitus without complications: Secondary | ICD-10-CM | POA: Insufficient documentation

## 2017-09-28 NOTE — Patient Instructions (Signed)
Medication Instructions:  Your physician recommends that you continue on your current medications as directed. Please refer to the Current Medication list given to you today.   Labwork: -None  Testing/Procedures: -None  Follow-Up: Your physician recommends that you keep your scheduled  follow-up appointment with Dr. Burt Knack.   Any Other Special Instructions Will Be Listed Below (If Applicable).     If you need a refill on your cardiac medications before your next appointment, please call your pharmacy.

## 2017-09-28 NOTE — Progress Notes (Signed)
Cardiology Office Note    Date:  09/28/2017   ID:  [name], DOB 23-Mar-1956, MRN [id number]  PCP:  [name], DO  Cardiologist:  Dr. Burt Knack   Chief Complaint: Hospital follow up s/p Same day PCI  History of Present Illness:   ALA CAPRI is a 62 y.o. male who presented to the office for follow-up of coronary artery disease. The patient presented with an inferoposterior STEMI in July 2018. He was unstable at the time of presentation and required temporary transvenous pacing and mechanical ventilation. He underwent PCI of the left circumflex with a drug-eluting stent. He was noted to have moderate mid RCA and mid LAD stenoses. His left ventricular systolic function has been well preserved.  The patient has been doing well until approximately 1 week prior to office visit when he developed chest pressure and tightness in the substernal region. He has experienced this with physical exertion that was relieved within about 1 minute of rest. He has had a few episodes of chest discomfort when he is lying in the supine position. He denied shortness of breath, edema, heart palpitations, lightheadedness, or syncope. Given his symptoms he was set up for outpatient cardiac cath. Also had Imdur added at this last office visit.   He underwent cardiac catheterization on 09/20/17 and received DES to left posterior descending artery and mild PTCA of the ostium of the PDA to allow for stent advancement. LVEF was preserved and no other culprit lesion was identified. There was moderate to severe disease in the proximal midportion of the LAD but FFR not quite significant (0.83). He tolerated the procedure well and was discharged on ASA and brilinta. Home medications were continued.   He presents today for clinic follow up. He has been doing well and continues on ASA and brilinta.  He states that he continues to be short of breath with the Brilinta since he started it in July 2018.  This does  not hinder his daily activities at this time.  He also states that he has been getting headaches with the Imdur.  The headaches were excruciating in the beginning, but have now transitioned to dull headaches but he is having them daily.  I proposed that we could stop the Imdur to see if he had a recurrence of chest pain.  He states that he would like to keep taking the Imdur at this time.  I asked him to keep [place] posted: If his Brilinta-related shortness of breath or headaches with Imdur stop him from doing daily activities or cardiac rehab, then we can adjust these medications.  He denies chest pain, palpitations, lower extremity swelling, syncope. He denies bleeding problems.  He is walking as weather permits.  He is waiting for cardiac rehab to be set up.  He reports some home blood pressure readings with systolic BP in the 751W.  His pressure here is within normal range at 132/64.  He is compliant with all home medications.  I instructed him to verify his next high BP reading with a blood pressure monitor at [facility] or [facility].  He will also bring in his home monitor at his next visit to be calibrated with our cuff.   Past Medical History:  Diagnosis Date  . CAD in native artery    a. inferoposterior STEMI 03/25/17: LHC LM calcified w/o stenosis, pLAD 50%, m-dLCx 99% s/p PCI/DES w/ Promus 3.5 x 24 mm DES, ostial OM1 50%, small RCA with mRCA 50%  . Diabetes mellitus  without complication ([place])   . High cholesterol   . Hyperlipidemia   . Hypertension   . Insomnia   . Pulmonary hypertension ([place])    a. TTE 03/25/17; EF 60-65%, nl WM, LV diastolic fxn nl, trivial AI, RV cavity size, wall thickness, and sys fxn nl, trivial TR, PASP 39 mmHg, trivial pericardial effusion  . Sleep apnea    could not use a cpap  . Wears dentures    full top-partial bottom  . Wears glasses     Past Surgical History:  Procedure Laterality Date  . COLONOSCOPY    . CORONARY STENT INTERVENTION N/A 09/20/2017   Procedure:  CORONARY STENT INTERVENTION;  Surgeon: Leonie Man, MD;  Location: [facility];  Service: Cardiovascular;  Laterality: N/A;  . CORONARY/GRAFT ACUTE MI REVASCULARIZATION N/A 03/25/2017   Procedure: Coronary/Graft Acute MI Revascularization;  Surgeon: Sherren Mocha, MD;  Location: [facility];  Service: Cardiovascular;  Laterality: N/A;  . GANGLION CYST EXCISION Left 07/25/2014   Procedure: EXCISION VOLAR AND DORSAL GANGLION LEFT WRIST;  Surgeon: Leanora Cover, MD;  Location: [facility];  Service: Orthopedics;  Laterality: Left;  . INTRAVASCULAR PRESSURE WIRE/FFR STUDY N/A 09/20/2017   Procedure: INTRAVASCULAR PRESSURE WIRE/FFR STUDY;  Surgeon: Leonie Man, MD;  Location: [facility];  Service: Cardiovascular;  Laterality: N/A;  . KNEE ARTHROSCOPY     rifgr and left  . LEFT HEART CATH AND CORONARY ANGIOGRAPHY N/A 03/25/2017   Procedure: Left Heart Cath and Coronary Angiography;  Surgeon: Sherren Mocha, MD;  Location: [facility];  Service: Cardiovascular;  Laterality: N/A;  . LEFT HEART CATH AND CORONARY ANGIOGRAPHY N/A 09/20/2017   Procedure: LEFT HEART CATH AND CORONARY ANGIOGRAPHY;  Surgeon: Leonie Man, MD;  Location: [facility];  Service: Cardiovascular;  Laterality: N/A;  . SHOULDER ARTHROSCOPY  2012   left  . SHOULDER ARTHROSCOPY     right  . TEMPORARY PACEMAKER N/A 03/25/2017   Procedure: Temporary Pacemaker;  Surgeon: Sherren Mocha, MD;  Location: [facility];  Service: Cardiovascular;  Laterality: N/A;  . TONSILLECTOMY    . TOTAL KNEE ARTHROPLASTY  2002   left    Current Medications: Prior to Admission medications   Medication Sig Start Date End Date Taking? Authorizing Provider  aspirin EC 81 MG EC tablet Take 1 tablet (81 mg total) by mouth daily. 03/28/17   Dunn, Areta Haber, PA-C  atorvastatin (LIPITOR) 80 MG tablet Take 1 tablet (80 mg total) by mouth daily at 6 PM. 03/27/17   Dunn, Areta Haber, PA-C  gabapentin  (NEURONTIN) 300 MG capsule Take 300 mg by mouth 3 (three) times daily as needed (NERVE PAIN).     [provider]  HYDROcodone-acetaminophen (NORCO) 7.5-325 MG tablet Take 1 tablet by mouth every 6 (six) hours as needed for moderate pain.    [provider]  isosorbide mononitrate (IMDUR) 30 MG 24 hr tablet Take 0.5 tablets (15 mg total) by mouth daily. 09/16/17 09/11/18  Sherren Mocha, MD  losartan (COZAAR) 25 MG tablet Take 1 tablet (25 mg total) by mouth daily. 03/28/17   Dunn, Areta Haber, PA-C  metFORMIN (GLUCOPHAGE) 1000 MG tablet Take 500 mg by mouth 2 (two) times daily with a meal. 08/15/17   [provider]  metoprolol tartrate (LOPRESSOR) 25 MG tablet Take 1 tablet (25 mg total) by mouth 2 (two) times daily. 03/27/17   Dunn, Areta Haber, PA-C  nitroGLYCERIN (NITROSTAT) 0.4 MG SL  tablet Place 1 tablet (0.4 mg total) under the tongue every 5 (five) minutes x 3 doses as needed for chest pain. 03/27/17   Rise Mu, PA-C  ticagrelor (BRILINTA) 90 MG TABS tablet Take 1 tablet (90 mg total) by mouth 2 (two) times daily. 03/27/17   Dunn, Areta Haber, PA-C  zolpidem (AMBIEN) 10 MG tablet Take 10 mg by mouth at bedtime as needed for sleep.     [provider]    Allergies:   Patient has no known allergies.   Social History   Socioeconomic History  . Marital status: Single    Spouse name: None  . Number of children: None  . Years of education: None  . Highest education level: None  Social Needs  . Financial resource strain: None  . Food insecurity - worry: None  . Food insecurity - inability: None  . Transportation needs - medical: None  . Transportation needs - non-medical: None  Occupational History  . None  Tobacco Use  . Smoking status: Former Smoker    Packs/day: 1.00    Last attempt to quit: 03/25/2017    Years since quitting: 0.5  . Smokeless tobacco: Never Used  Substance and Sexual Activity  . Alcohol use: Yes    Comment: occ  . Drug use: No  . Sexual  activity: None  Other Topics Concern  . None  Social History Narrative   Per wife, pt was preparing to retire soon from his job at [employer].   Family History:  The patient's family history includes Hypertension in his brother, father, and mother.   ROS:   Please see the history of present illness.    ROS All other systems reviewed and are negative.   PHYSICAL EXAM:   VS:  BP 132/64   Pulse 65   Resp 16   Ht 5\' 9"  (1.753 m)   Wt 180 lb 4.8 oz (81.8 kg)   SpO2 97%   BMI 26.63 kg/m    GEN: Well nourished, well developed, in no acute distress  HEENT: normal  Neck: no JVD, carotid bruits, or masses Cardiac: RRR; no murmurs, rubs, or gallops,no edema  Respiratory:  clear to auscultation bilaterally, normal work of breathing GI: soft, nontender, nondistended, + BS MS: no deformity or atrophy  Skin: warm and dry, no rash Neuro:  Alert and Oriented x 3, Strength and sensation are intact Psych: euthymic mood, full affect   Wt Readings from Last 3 Encounters:  09/28/17 180 lb 4.8 oz (81.8 kg)  09/20/17 183 lb (83 kg)  09/16/17 183 lb (83 kg)      Studies/Labs Reviewed:   EKG:  EKG is not ordered today.    Recent Labs: 03/25/2017: B Natriuretic Peptide 132.8 03/26/2017: Magnesium 1.6 05/13/2017: ALT 23 09/16/2017: BUN 10; Creatinine, Ser 0.92; Hemoglobin 13.9; Platelets 142; Potassium 4.3; Sodium 141   Lipid Panel    Component Value Date/Time   CHOL 81 (L) 05/13/2017 0749   TRIG 82 05/13/2017 0749   HDL 24 (L) 05/13/2017 0749   CHOLHDL 3.4 05/13/2017 0749   CHOLHDL 6.5 03/25/2017 0427   VLDL 46 (H) 03/25/2017 0427   LDLCALC 41 05/13/2017 0749    Additional studies/ records that were reviewed today include:   Echocardiogram: 03/25/17 Study Conclusions - Left ventricle: The cavity size was normal. Systolic function was   normal. The estimated ejection fraction was in the range of 60%   to 65%. Wall motion was normal; there were no regional wall  motion abnormalities.  Left ventricular diastolic function   parameters were normal. - Aortic valve: Transvalvular velocity was within the normal range.   There was no stenosis. There was trivial regurgitation. - Mitral valve: Transvalvular velocity was within the normal range.   There was no evidence for stenosis. There was no regurgitation. - Right ventricle: The cavity size was normal. Wall thickness was   normal. Systolic function was normal. - Tricuspid valve: There was trivial regurgitation. - Pulmonary arteries: Systolic pressure was within the normal   range. PA peak pressure: 39 mm Hg (S). - Pericardium, extracardiac: A trivial pericardial effusion was   identified.   Cardiac Catheterization: 09/20/17  LPDA lesion is 90% stenosed. Previously nonvisualized vessel.  A drug-eluting stent was successfully placed using a STENT SYNERGY DES 2.25X28. --> Postdilated 2.4 mm  Post intervention, there is a 0% residual stenosis.  Ost LPDA lesion is 60% stenosed. Vessel not previously visualized during an index PCI case. -- Treated with PTCA alone  Balloon angioplasty was performed using a BALLOON SAPPHIRE 2.0X15. Post intervention, there is a 40% residual stenosis.  Previously placed Mid Cx stent (unknown type) is widely patent.  Prox LAD lesion is 65% stenosed. FFR NOT Significant (0.83)  Ost 2nd Mrg to 2nd Mrg lesion is 60% stenosed.  Non-dominant vessel Mid RCA lesion is 50% stenosed with 50% stenosed side branch in Acute Mrg.  The left ventricular systolic function is normal. The left ventricular ejection fraction is 55-65% by visual estimate.   Successful PCI of left posterior descending artery which was not previously visualized using a Synergy DES stent. Mild PTCA of the ostium of the PDA to allow for stent advancement.  Moderate residual disease noted.  Moderate severe disease in the proximal-midportion of the LAD but FFR not quite significant (0.83).  Otherwise preserved LVEF.  No other  obvious culprit lesions noted.  Plan: Relatively stable post PCI.  He should be stable for same day discharge.  He is already on aspirin and Brilinta  We will continue home medications  Hold metformin for 48 hours post PCI  Follow-up with Dr. Burt Knack as directed.    ASSESSMENT & PLAN:    1. CAD s/p DES to left PDA 09/20/17, hx of inferoposterior STEMI - continue ASA and brilinta, continue statin - pt denies chest pain and is walking daily while waiting for cardiac rehab to be setup He reports headaches with imdur that were excruciating in the beginning but have become less intense with time. He wants to keep taking the imdur. I suggested that he call our office if the headaches start to interfere with daily activities or cardiac rehab so we could stop the imdur and adjust losartan.  2. HTN -continue imdur, losartan, and lopressor He reports some home blood pressure readings with systolic BP in the 130Q.  His pressure here is within normal range at 132/64.  He is compliant with all home medications.  I instructed him to verify his next high BP reading with a blood pressure monitor at [facility].  He will also bring in his home monitor at his next visit to be calibrated with our cuff.  3. HLD - continue statin - repeat fasting lipid panel at yearly visit, no medication changes  Lipid Panel     Component Value Date/Time   CHOL 81 (L) 05/13/2017 0749   TRIG 82 05/13/2017 0749   HDL 24 (L) 05/13/2017 0749   CHOLHDL 3.4 05/13/2017 0749   CHOLHDL 6.5 03/25/2017 0427  VLDL 46 (H) 03/25/2017 0427   LDLCALC 41 05/13/2017 0749     4. DM - no controlled - last A1c 9.4%, now on metformin - per patient, last A1c three weeks ago was 6.6% (not in Epic) - per PCP    Medication Adjustments/Labs and Tests Ordered: Current medicines are reviewed at length with the patient today.  Concerns regarding medicines are outlined above.  Medication changes, Labs and Tests ordered today are  listed in the Patient Instructions below. Patient Instructions  Medication Instructions:  Your physician recommends that you continue on your current medications as directed. Please refer to the Current Medication list given to you today.   Labwork: -None  Testing/Procedures: -None  Follow-Up: Your physician recommends that you keep your scheduled  follow-up appointment with Dr. Burt Knack.   Any Other Special Instructions Will Be Listed Below (If Applicable).     If you need a refill on your cardiac medications before your next appointment, please call your pharmacy.      Signed, Ledora Bottcher, [place]  09/28/2017 11:51 AM    [facility] [place], [place], [place]  [id number] Phone: [phone]; Fax: [phone]

## 2017-10-25 ENCOUNTER — Encounter (HOSPITAL_COMMUNITY)
Admission: RE | Admit: 2017-10-25 | Discharge: 2017-10-25 | Disposition: A | Discharge status: Home / Self Care | Payer: 59 | Source: Ambulatory Visit | Attending: Cardiovascular Disease | Admitting: Cardiovascular Disease

## 2017-10-25 ENCOUNTER — Encounter (HOSPITAL_COMMUNITY): Payer: Self-pay

## 2017-10-25 VITALS — BP 172/86 | HR 60 | Ht 68.0 in | Wt 179.2 lb

## 2017-10-25 DIAGNOSIS — Z48812 Encounter for surgical aftercare following surgery on the circulatory system: Secondary | ICD-10-CM | POA: Diagnosis not present

## 2017-10-25 DIAGNOSIS — Z955 Presence of coronary angioplasty implant and graft: Secondary | ICD-10-CM | POA: Diagnosis present

## 2017-10-25 NOTE — Progress Notes (Signed)
Daily Session Note  Patient Details  Name: [name] Foster MRN: [id number] Date of Birth: 1956/08/12 Referring Provider:     CARDIAC REHAB PHASE II ORIENTATION from 10/25/2017 in [place]  Referring Provider  Dr. Burt Knack      Encounter Date: 10/25/2017  Check In: Session Check In - 10/25/17 1230      Check-In   Location  [facility]    Staff Present  Russella Dar, MS, EP, [facility], Exercise Physiologist;Gregory Luther Parody, BS, EP, Exercise Physiologist    Supervising physician immediately available to respond to emergencies  See telemetry face sheet for immediately available MD    Medication changes reported      No    Fall or balance concerns reported     No    Tobacco Cessation  -- Quit 03/25/17    Warm-up and Cool-down  Performed as group-led instruction    Resistance Training Performed  Yes    VAD Patient?  No      Pain Assessment   Currently in Pain?  No/denies    Pain Score  0-No pain    Multiple Pain Sites  No       Capillary Blood Glucose: No results found for this or any previous visit (from the past 24 hour(s)).    Social History   Tobacco Use  Smoking Status Former Smoker  . Packs/day: 1.00  . Last attempt to quit: 03/25/2017  . Years since quitting: 0.5  Smokeless Tobacco Never Used    Goals Met:  Independence with exercise equipment Exercise tolerated well No report of cardiac concerns or symptoms Strength training completed today  Goals Unmet:  Not Applicable  Comments: Check out: 1430   Dr. Kate Sable is Medical Director for [facility] and Pulmonary Rehab.

## 2017-10-25 NOTE — Progress Notes (Signed)
Cardiac Individual Treatment Plan  Patient Details  Name: [name] Foster MRN: [id number] Date of Birth: Dec 05, 1962 Referring Provider:     CARDIAC REHAB PHASE II ORIENTATION from 10/25/2017 in [place]  Referring Provider  Dr. Burt Knack      Initial Encounter Date:    CARDIAC REHAB PHASE II ORIENTATION from 10/25/2017 in [place]  Date  10/25/17  Referring Provider  Dr. Burt Knack      Visit Diagnosis: Status post coronary artery stent placement  Patient's Home Medications on Admission:  Current Outpatient Medications:  .  aspirin EC 81 MG EC tablet, Take 1 tablet (81 mg total) by mouth daily., Disp: 30 tablet, Rfl: 11 .  atorvastatin (LIPITOR) 80 MG tablet, Take 1 tablet (80 mg total) by mouth daily at 6 PM., Disp: 30 tablet, Rfl: 11 .  gabapentin (NEURONTIN) 300 MG capsule, Take 300 mg by mouth 3 (three) times daily as needed (NERVE PAIN). , Disp: , Rfl:  .  HYDROcodone-acetaminophen (NORCO) 7.5-325 MG tablet, Take 1 tablet by mouth every 6 (six) hours as needed for moderate pain., Disp: , Rfl:  .  isosorbide mononitrate (IMDUR) 30 MG 24 hr tablet, Take 0.5 tablets (15 mg total) by mouth daily., Disp: 15 tablet, Rfl: 11 .  losartan (COZAAR) 25 MG tablet, Take 1 tablet (25 mg total) by mouth daily., Disp: 30 tablet, Rfl: 11 .  metFORMIN (GLUCOPHAGE) 1000 MG tablet, Take 500 mg by mouth 2 (two) times daily with a meal., Disp: , Rfl: 6 .  metoprolol tartrate (LOPRESSOR) 25 MG tablet, Take 1 tablet (25 mg total) by mouth 2 (two) times daily., Disp: 60 tablet, Rfl: 11 .  nitroGLYCERIN (NITROSTAT) 0.4 MG SL tablet, Place 1 tablet (0.4 mg total) under the tongue every 5 (five) minutes x 3 doses as needed for chest pain., Disp: 25 tablet, Rfl: 11 .  ticagrelor (BRILINTA) 90 MG TABS tablet, Take 1 tablet (90 mg total) by mouth 2 (two) times daily., Disp: 60 tablet, Rfl: 0 .  zolpidem (AMBIEN) 10 MG tablet, Take 10 mg by mouth at bedtime as needed  for sleep. , Disp: , Rfl:   Past Medical History: Past Medical History:  Diagnosis Date  . CAD in native artery    a. 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 DES to left PDA 09/20/17  . Diabetes mellitus without complication ([place])   . High cholesterol   . Hyperlipidemia   . Hypertension   . Insomnia   . Pulmonary hypertension ([place])    a. TTE 03/25/17; EF 60-65%, nl WM, LV diastolic fxn nl, trivial AI, RV cavity size, wall thickness, and sys fxn nl, trivial TR, PASP 39 mmHg, trivial pericardial effusion  . Sleep apnea    could not use a cpap  . Wears dentures    full top-partial bottom  . Wears glasses     Tobacco Use: Social History   Tobacco Use  Smoking Status Former Smoker  . Packs/day: 1.00  . Last attempt to quit: 03/25/2017  . Years since quitting: 0.5  Smokeless Tobacco Never Used    Labs: Recent Review Scientist, physiological    Labs for ITP Cardiac and Pulmonary Rehab Latest Ref Rng & Units 03/25/2017 05/13/2017   Cholestrol 100 - 199 mg/dL 163 81(L)   LDLCALC 0 - 99 mg/dL 92 41   HDL >  39 mg/dL 25(L) 24(L)   Trlycerides 0 - 149 mg/dL 229(H) 82   Hemoglobin A1c 4.8 - 5.6 % 9.4(H) -   PHART 7.350 - 7.450 7.206(L) -   PCO2ART 32.0 - 48.0 mmHg 53.8(H) -   HCO3 20.0 - 28.0 mmol/L 21.3 -   TCO2 0 - 100 mmol/L 23 -   ACIDBASEDEF 0.0 - 2.0 mmol/L 7.0(H) -   O2SAT % 99.0 -      Capillary Blood Glucose: Lab Results  Component Value Date   GLUCAP 92 09/20/2017   GLUCAP 128 (H) 09/20/2017   GLUCAP 115 (H) 03/27/2017   GLUCAP 105 (H) 03/27/2017   GLUCAP 106 (H) 03/27/2017     Exercise Target Goals: Date: 10/25/17  Exercise Program Goal: Individual exercise prescription set using results from initial 6 min walk test and THRR while considering  patient's activity barriers and safety.   Exercise Prescription Goal: Starting with aerobic activity 30  plus minutes a day, 3 days per week for initial exercise prescription. Provide home exercise prescription and guidelines that participant acknowledges understanding prior to discharge.  Activity Barriers & Risk Stratification: Activity Barriers & Cardiac Risk Stratification - 10/25/17 1413      Activity Barriers & Cardiac Risk Stratification   Activity Barriers  None    Cardiac Risk Stratification  High       6 Minute Walk: 6 Minute Walk    Row Name 10/25/17 1412         6 Minute Walk   Phase  Initial     Distance  1550 feet     Distance % Change  0 %     Distance Feet Change  0 ft     Walk Time  6 minutes     # of Rest Breaks  0     MPH  2.93     METS  3.24     RPE  12     Perceived Dyspnea   13     VO2 Peak  14.79     Symptoms  No     Resting HR  60 bpm     Resting BP  172/86     Resting Oxygen Saturation   97 %     Exercise Oxygen Saturation  during 6 min walk  97 %     Max Ex. HR  90 bpm     Max Ex. BP  208/90     2 Minute Post BP  180/84        Oxygen Initial Assessment:   Oxygen Re-Evaluation:   Oxygen Discharge (Final Oxygen Re-Evaluation):   Initial Exercise Prescription: Initial Exercise Prescription - 10/25/17 1400      Date of Initial Exercise RX and Referring Provider   Date  10/25/17    Referring Provider  Dr. Burt Knack      Treadmill   MPH  2    Grade  0    Minutes  15    METs  2.5      Recumbant Elliptical   Level  1    RPM  45    Watts  58    Minutes  20    METs  3.2      Prescription Details   Frequency (times per week)  3    Duration  Progress to 30 minutes of continuous aerobic without signs/symptoms of physical distress      Intensity   THRR 40-80% of Max Heartrate  [id number]    Ratings  of Perceived Exertion  11-13    Perceived Dyspnea  0-4      Progression   Progression  Continue progressive overload as per policy without signs/symptoms or physical distress.      Resistance Training   Training Prescription  Yes     Weight  1    Reps  10-15       Perform Capillary Blood Glucose checks as needed.  Exercise Prescription Changes:   Exercise Comments:   Exercise Goals and Review:  Exercise Goals    Row Name 10/25/17 1413             Exercise Goals   Increase Physical Activity  Yes       Intervention  Provide advice, education, support and counseling about physical activity/exercise needs.;Develop an individualized exercise prescription for aerobic and resistive training based on initial evaluation findings, risk stratification, comorbidities and participant's personal goals.       Expected Outcomes  Short Term: Attend rehab on a regular basis to increase amount of physical activity.       Increase Strength and Stamina  Yes       Intervention  Provide advice, education, support and counseling about physical activity/exercise needs.;Develop an individualized exercise prescription for aerobic and resistive training based on initial evaluation findings, risk stratification, comorbidities and participant's personal goals.       Expected Outcomes  Short Term: Increase workloads from initial exercise prescription for resistance, speed, and METs.       Able to understand and use rate of perceived exertion (RPE) scale  Yes       Intervention  Provide education and explanation on how to use RPE scale       Expected Outcomes  Short Term: Able to use RPE daily in rehab to express subjective intensity level;Long Term:  Able to use RPE to guide intensity level when exercising independently       Able to understand and use Dyspnea scale  Yes       Intervention  Provide education and explanation on how to use Dyspnea scale       Expected Outcomes  Long Term: Able to use Dyspnea scale to guide intensity level when exercising independently;Short Term: Able to use Dyspnea scale daily in rehab to express subjective sense of shortness of breath during exertion       Knowledge and understanding of Target Heart Rate  Range (THRR)  Yes       Intervention  Provide education and explanation of THRR including how the numbers were predicted and where they are located for reference       Expected Outcomes  Short Term: Able to state/look up THRR;Short Term: Able to use daily as guideline for intensity in rehab;Long Term: Able to use THRR to govern intensity when exercising independently       Able to check pulse independently  Yes       Intervention  Review the importance of being able to check your own pulse for safety during independent exercise;Provide education and demonstration on how to check pulse in carotid and radial arteries.       Expected Outcomes  Short Term: Able to explain why pulse checking is important during independent exercise;Long Term: Able to check pulse independently and accurately       Understanding of Exercise Prescription  Yes       Intervention  Provide education, explanation, and written materials on patient's individual exercise prescription  Expected Outcomes  Long Term: Able to explain home exercise prescription to exercise independently;Short Term: Able to explain program exercise prescription          Exercise Goals Re-Evaluation :    Discharge Exercise Prescription (Final Exercise Prescription Changes):   Nutrition:  Target Goals: Understanding of nutrition guidelines, daily intake of sodium 1500mg , cholesterol 200mg , calories 30% from fat and 7% or less from saturated fats, daily to have 5 or more servings of fruits and vegetables.  Biometrics: Pre Biometrics - 10/25/17 1414      Pre Biometrics   Height  5\' 8"  (1.727 m)    Weight  179 lb 3.2 oz (81.3 kg)    Waist Circumference  39 inches    Hip Circumference  37 inches    Waist to Hip Ratio  1.05 %    BMI (Calculated)  27.25    Triceps Skinfold  7 mm    % Body Fat  23.36 %    Grip Strength  79.73 kg    Flexibility  14.83 in    Single Leg Stand  60 seconds        Nutrition Therapy Plan and Nutrition  Goals: Nutrition Therapy & Goals - 10/25/17 1500      Personal Nutrition Goals   Personal Goal #2  Patient is eating a diabetic diet anlong with making heart healthy choices.        Nutrition Assessments: Nutrition Assessments - 10/25/17 1501      MEDFICTS Scores   Pre Score  27       Nutrition Goals Re-Evaluation:   Nutrition Goals Discharge (Final Nutrition Goals Re-Evaluation):   Psychosocial: Target Goals: Acknowledge presence or absence of significant depression and/or stress, maximize coping skills, provide positive support system. Participant is able to verbalize types and ability to use techniques and skills needed for reducing stress and depression.  Initial Review & Psychosocial Screening: Initial Psych Review & Screening - 10/25/17 1506      Initial Review   Current issues with  None Identified    Source of Stress Concerns  None Identified      Family Dynamics   Good Support System?  Yes      Barriers   Psychosocial barriers to participate in program  There are no identifiable barriers or psychosocial needs.      Screening Interventions   Interventions  Encouraged to exercise    Expected Outcomes  Short Term goal: Identification and review with participant of any Quality of Life or Depression concerns found by scoring the questionnaire.;Long Term goal: The participant improves quality of Life and PHQ9 Scores as seen by post scores and/or verbalization of changes       Quality of Life Scores: Quality of Life - 10/25/17 1415      Quality of Life Scores   Health/Function Pre  20.4 %    Socioeconomic Pre  26.86 %    Psych/Spiritual Pre  23.79 %    Family Pre  28.8 %    GLOBAL Pre  23.66 %      Scores of 19 and below usually indicate a poorer quality of life in these areas.  A difference of  2-3 points is a clinically meaningful difference.  A difference of 2-3 points in the total score of the Quality of Life Index has been associated with significant  improvement in overall quality of life, self-image, physical symptoms, and general health in studies assessing change in quality of life.  PHQ-9: Recent  Review Flowsheet Data    Depression screen [facility] 2/9 10/25/2017 04/26/2017   Decreased Interest 0 0   Down, Depressed, Hopeless 0 0   PHQ - 2 Score 0 0   Altered sleeping 1 -   Tired, decreased energy 1 -   Change in appetite 0 -   Feeling bad or failure about yourself  0 -   Trouble concentrating 0 -   Moving slowly or fidgety/restless 0 -   Suicidal thoughts 0 -   PHQ-9 Score 2 -   Difficult doing work/chores Not difficult at all -     Interpretation of Total Score  Total Score Depression Severity:  1-4 = Minimal depression, 5-9 = Mild depression, 10-14 = Moderate depression, 15-19 = Moderately severe depression, 20-27 = Severe depression   Psychosocial Evaluation and Intervention: Psychosocial Evaluation - 10/25/17 1507      Psychosocial Evaluation & Interventions   Interventions  Encouraged to exercise with the program and follow exercise prescription    Continue Psychosocial Services   No Follow up required       Psychosocial Re-Evaluation:   Psychosocial Discharge (Final Psychosocial Re-Evaluation):   Vocational Rehabilitation: Provide vocational rehab assistance to qualifying candidates.   Vocational Rehab Evaluation & Intervention: Vocational Rehab - 10/25/17 1451      Initial Vocational Rehab Evaluation & Intervention   Assessment shows need for Vocational Rehabilitation  No       Education: Education Goals: Education classes will be provided on a weekly basis, covering required topics. Participant will state understanding/return demonstration of topics presented.  Learning Barriers/Preferences: Learning Barriers/Preferences - 10/25/17 1448      Learning Barriers/Preferences   Learning Barriers  None    Learning Preferences  Skilled Demonstration;Group Instruction;Verbal Instruction       Education  Topics: Hypertension, Hypertension Reduction -Define heart disease and high blood pressure. Discus how high blood pressure affects the body and ways to reduce high blood pressure.   Exercise and Your Heart -Discuss why it is important to exercise, the FITT principles of exercise, normal and abnormal responses to exercise, and how to exercise safely.   Angina -Discuss definition of angina, causes of angina, treatment of angina, and how to decrease risk of having angina.   Cardiac Medications -Review what the following cardiac medications are used for, how they affect the body, and side effects that may occur when taking the medications.  Medications include Aspirin, Beta blockers, calcium channel blockers, ACE Inhibitors, angiotensin receptor blockers, diuretics, digoxin, and antihyperlipidemics.   Congestive Heart Failure -Discuss the definition of CHF, how to live with CHF, the signs and symptoms of CHF, and how keep track of weight and sodium intake.   Heart Disease and Intimacy -Discus the effect sexual activity has on the heart, how changes occur during intimacy as we age, and safety during sexual activity.   Smoking Cessation / COPD -Discuss different methods to quit smoking, the health benefits of quitting smoking, and the definition of COPD.   Nutrition I: Fats -Discuss the types of cholesterol, what cholesterol does to the heart, and how cholesterol levels can be controlled.   Nutrition II: Labels -Discuss the different components of food labels and how to read food label   Heart Parts/Heart Disease and PAD -Discuss the anatomy of the heart, the pathway of blood circulation through the heart, and these are affected by heart disease.   Stress I: Signs and Symptoms -Discuss the causes of stress, how stress may lead to anxiety and depression,  and ways to limit stress.   Stress II: Relaxation -Discuss different types of relaxation techniques to limit  stress.   Warning Signs of Stroke / TIA -Discuss definition of a stroke, what the signs and symptoms are of a stroke, and how to identify when someone is having stroke.   Knowledge Questionnaire Score: Knowledge Questionnaire Score - 10/25/17 1449      Knowledge Questionnaire Score   Pre Score  21/24       Core Components/Risk Factors/Patient Goals at Admission: Personal Goals and Risk Factors at Admission - 10/25/17 1502      Core Components/Risk Factors/Patient Goals on Admission   Intervention  Weight Management: Provide education and appropriate resources to help participant work on and attain dietary goals.    Admit Weight  179 lb 3.2 oz (81.3 kg)    Goal Weight: Short Term  174 lb 3.2 oz (79 kg)    Goal Weight: Long Term  169 lb 3.2 oz (76.7 kg)    Expected Outcomes  Short Term: Continue to assess and modify interventions until short term weight is achieved;Long Term: Adherence to nutrition and physical activity/exercise program aimed toward attainment of established weight goal    Diabetes  -- Has diabetes under control.    Personal Goal Other  Yes    Personal Goal  Regain heart health, lose 5-10 pounds.     Intervention  Attend CR 3 x week and supplement with home exercise 2 x week.     Expected Outcomes  Achieve personal goals.        Core Components/Risk Factors/Patient Goals Review:    Core Components/Risk Factors/Patient Goals at Discharge (Final Review):    ITP Comments: ITP Comments    Row Name 10/25/17 1453           ITP Comments  Mr. Misiaszek is a 63 year old patient with reocurring stent placement. 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 He did not come to CR at that time. He had another episode where he needed another stent. Patient is now ready to attend CR to regain is strength and regain heart health.           Comments: Patient arrived for 1st visit/orientation/education at 1230. Patient was referred to CR by Dr. Burt Knack due to Stent Placement  (Z95.5). During orientation advised patient on arrival and appointment times what to wear, what to do before, during and after exercise. Reviewed attendance and class policy. Talked about inclement weather and class consultation policy. Pt is scheduled to return Cardiac Rehab on 10/31/2017 at 0930. Pt was advised to come to class 15 minutes before class starts. Patient was also given instructions on meeting with the dietician and attending the Family Structure classes. Discussed RPE/Dpysnea scales. Discussed initial THR and how to find their radial and/or carotid pulse. Discussed the initial exercise prescription and how this effects their progress. Pt is eager to get started. Patient participated in warm up stretches followed by light weights and resistance bands. Patient was able to complete 6 minute walk test. Patient c/o pain in (R) hip at 2/10. Pain subsided to 0/10 at 2 minute rest. Pain was completely gone at the end of orientation. Patient was measured for the equipment. Discussed equipment safety with patient. Too patients BS it was 104 mg/dl. Explained that we will take his BS three more times pre exercise to make sure it is WNL to participate in exercise. Took patient pre-anthropometric measurements. Patient finished visit at 1430.

## 2017-10-25 NOTE — Progress Notes (Signed)
Cardiac/Pulmonary Rehab Medication Review by a Pharmacist  Does the patient  feel that his/her medications are working for him/her?  yes  Has the patient been experiencing any side effects to the medications prescribed?  yes  Does the patient measure his/her own blood pressure or blood glucose at home?  yes   Does the patient have any problems obtaining medications due to transportation or finances?   no  Understanding of regimen: good Understanding of indications: good Potential of compliance: excellent  Questions asked to Determine Patient Understanding of Medication Regimen:  1. What is the name of the medication?  2. What is the medication used for?  3. When should it be taken?  4. How much should be taken?  5. How will you take it?  6. What side effects should you report?  Understanding Defined as: Excellent: All questions above are correct Good: Questions 1-4 are correct Fair: Questions 1-2 are correct  Poor: 1 or none of the above questions are correct   Pharmacist comments: Pt does c/o headache with Imdur and states the Brilinta causes him to have difficulty catching his breath at times but states these issues have gotten better.  Pt does check his blood sugar and BP at home regularly.  States Metformin dose has been reduced to sugars being good.    Hart Robinsons A 10/25/2017 1:15 PM

## 2017-10-26 NOTE — Progress Notes (Signed)
Cardiac Individual Treatment Plan  Patient Details  Name: [name] Foster MRN: [id number] Date of Birth: Feb 02, 1956 Referring Provider:     CARDIAC REHAB PHASE II ORIENTATION from 10/25/2017 in [place]  Referring Provider  Dr. Burt Knack      Initial Encounter Date:    CARDIAC REHAB PHASE II ORIENTATION from 10/25/2017 in [place]  Date  10/25/17  Referring Provider  Dr. Burt Knack      Visit Diagnosis: Status post coronary artery stent placement  Patient's Home Medications on Admission:  Current Outpatient Medications:  .  aspirin EC 81 MG EC tablet, Take 1 tablet (81 mg total) by mouth daily., Disp: 30 tablet, Rfl: 11 .  atorvastatin (LIPITOR) 80 MG tablet, Take 1 tablet (80 mg total) by mouth daily at 6 PM., Disp: 30 tablet, Rfl: 11 .  gabapentin (NEURONTIN) 300 MG capsule, Take 300 mg by mouth 3 (three) times daily as needed (NERVE PAIN). , Disp: , Rfl:  .  HYDROcodone-acetaminophen (NORCO) 7.5-325 MG tablet, Take 1 tablet by mouth every 6 (six) hours as needed for moderate pain., Disp: , Rfl:  .  isosorbide mononitrate (IMDUR) 30 MG 24 hr tablet, Take 0.5 tablets (15 mg total) by mouth daily., Disp: 15 tablet, Rfl: 11 .  losartan (COZAAR) 25 MG tablet, Take 1 tablet (25 mg total) by mouth daily., Disp: 30 tablet, Rfl: 11 .  metFORMIN (GLUCOPHAGE) 1000 MG tablet, Take 500 mg by mouth 2 (two) times daily with a meal., Disp: , Rfl: 6 .  metoprolol tartrate (LOPRESSOR) 25 MG tablet, Take 1 tablet (25 mg total) by mouth 2 (two) times daily., Disp: 60 tablet, Rfl: 11 .  nitroGLYCERIN (NITROSTAT) 0.4 MG SL tablet, Place 1 tablet (0.4 mg total) under the tongue every 5 (five) minutes x 3 doses as needed for chest pain., Disp: 25 tablet, Rfl: 11 .  ticagrelor (BRILINTA) 90 MG TABS tablet, Take 1 tablet (90 mg total) by mouth 2 (two) times daily., Disp: 60 tablet, Rfl: 0 .  zolpidem (AMBIEN) 10 MG tablet, Take 10 mg by mouth at bedtime as needed  for sleep. , Disp: , Rfl:   Past Medical History: Past Medical History:  Diagnosis Date  . CAD in native artery    a. 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 DES to left PDA 09/20/17  . Diabetes mellitus without complication ([place])   . High cholesterol   . Hyperlipidemia   . Hypertension   . Insomnia   . Pulmonary hypertension ([place])    a. TTE 03/25/17; EF 60-65%, nl WM, LV diastolic fxn nl, trivial AI, RV cavity size, wall thickness, and sys fxn nl, trivial TR, PASP 39 mmHg, trivial pericardial effusion  . Sleep apnea    could not use a cpap  . Wears dentures    full top-partial bottom  . Wears glasses     Tobacco Use: Social History   Tobacco Use  Smoking Status Former Smoker  . Packs/day: 1.00  . Last attempt to quit: 03/25/2017  . Years since quitting: 0.5  Smokeless Tobacco Never Used    Labs: Recent Review Scientist, physiological    Labs for ITP Cardiac and Pulmonary Rehab Latest Ref Rng & Units 03/25/2017 05/13/2017   Cholestrol 100 - 199 mg/dL 163 81(L)   LDLCALC 0 - 99 mg/dL 92 41   HDL >  39 mg/dL 25(L) 24(L)   Trlycerides 0 - 149 mg/dL 229(H) 82   Hemoglobin A1c 4.8 - 5.6 % 9.4(H) -   PHART 7.350 - 7.450 7.206(L) -   PCO2ART 32.0 - 48.0 mmHg 53.8(H) -   HCO3 20.0 - 28.0 mmol/L 21.3 -   TCO2 0 - 100 mmol/L 23 -   ACIDBASEDEF 0.0 - 2.0 mmol/L 7.0(H) -   O2SAT % 99.0 -      Capillary Blood Glucose: Lab Results  Component Value Date   GLUCAP 92 09/20/2017   GLUCAP 128 (H) 09/20/2017   GLUCAP 115 (H) 03/27/2017   GLUCAP 105 (H) 03/27/2017   GLUCAP 106 (H) 03/27/2017     Exercise Target Goals: Date: 10/25/17  Exercise Program Goal: Individual exercise prescription set using results from initial 6 min walk test and THRR while considering  patient's activity barriers and safety.   Exercise Prescription Goal: Starting with aerobic activity 30  plus minutes a day, 3 days per week for initial exercise prescription. Provide home exercise prescription and guidelines that participant acknowledges understanding prior to discharge.  Activity Barriers & Risk Stratification: Activity Barriers & Cardiac Risk Stratification - 10/25/17 1413      Activity Barriers & Cardiac Risk Stratification   Activity Barriers  None    Cardiac Risk Stratification  High       6 Minute Walk: 6 Minute Walk    Row Name 10/25/17 1412         6 Minute Walk   Phase  Initial     Distance  1550 feet     Distance % Change  0 %     Distance Feet Change  0 ft     Walk Time  6 minutes     # of Rest Breaks  0     MPH  2.93     METS  3.24     RPE  12     Perceived Dyspnea   13     VO2 Peak  14.79     Symptoms  No     Resting HR  60 bpm     Resting BP  172/86     Resting Oxygen Saturation   97 %     Exercise Oxygen Saturation  during 6 min walk  97 %     Max Ex. HR  90 bpm     Max Ex. BP  208/90     2 Minute Post BP  180/84        Oxygen Initial Assessment:   Oxygen Re-Evaluation:   Oxygen Discharge (Final Oxygen Re-Evaluation):   Initial Exercise Prescription: Initial Exercise Prescription - 10/25/17 1400      Date of Initial Exercise RX and Referring Provider   Date  10/25/17    Referring Provider  Dr. Burt Knack      Treadmill   MPH  2    Grade  0    Minutes  15    METs  2.5      Recumbant Elliptical   Level  1    RPM  45    Watts  58    Minutes  20    METs  3.2      Prescription Details   Frequency (times per week)  3    Duration  Progress to 30 minutes of continuous aerobic without signs/symptoms of physical distress      Intensity   THRR 40-80% of Max Heartrate  [phone]    Ratings  of Perceived Exertion  11-13    Perceived Dyspnea  0-4      Progression   Progression  Continue progressive overload as per policy without signs/symptoms or physical distress.      Resistance Training   Training Prescription  Yes     Weight  1    Reps  10-15       Perform Capillary Blood Glucose checks as needed.  Exercise Prescription Changes:   Exercise Comments:   Exercise Goals and Review:  Exercise Goals    Row Name 10/25/17 1413             Exercise Goals   Increase Physical Activity  Yes       Intervention  Provide advice, education, support and counseling about physical activity/exercise needs.;Develop an individualized exercise prescription for aerobic and resistive training based on initial evaluation findings, risk stratification, comorbidities and participant's personal goals.       Expected Outcomes  Short Term: Attend rehab on a regular basis to increase amount of physical activity.       Increase Strength and Stamina  Yes       Intervention  Provide advice, education, support and counseling about physical activity/exercise needs.;Develop an individualized exercise prescription for aerobic and resistive training based on initial evaluation findings, risk stratification, comorbidities and participant's personal goals.       Expected Outcomes  Short Term: Increase workloads from initial exercise prescription for resistance, speed, and METs.       Able to understand and use rate of perceived exertion (RPE) scale  Yes       Intervention  Provide education and explanation on how to use RPE scale       Expected Outcomes  Short Term: Able to use RPE daily in rehab to express subjective intensity level;Long Term:  Able to use RPE to guide intensity level when exercising independently       Able to understand and use Dyspnea scale  Yes       Intervention  Provide education and explanation on how to use Dyspnea scale       Expected Outcomes  Long Term: Able to use Dyspnea scale to guide intensity level when exercising independently;Short Term: Able to use Dyspnea scale daily in rehab to express subjective sense of shortness of breath during exertion       Knowledge and understanding of Target Heart Rate  Range (THRR)  Yes       Intervention  Provide education and explanation of THRR including how the numbers were predicted and where they are located for reference       Expected Outcomes  Short Term: Able to state/look up THRR;Short Term: Able to use daily as guideline for intensity in rehab;Long Term: Able to use THRR to govern intensity when exercising independently       Able to check pulse independently  Yes       Intervention  Review the importance of being able to check your own pulse for safety during independent exercise;Provide education and demonstration on how to check pulse in carotid and radial arteries.       Expected Outcomes  Short Term: Able to explain why pulse checking is important during independent exercise;Long Term: Able to check pulse independently and accurately       Understanding of Exercise Prescription  Yes       Intervention  Provide education, explanation, and written materials on patient's individual exercise prescription  Expected Outcomes  Long Term: Able to explain home exercise prescription to exercise independently;Short Term: Able to explain program exercise prescription          Exercise Goals Re-Evaluation :    Discharge Exercise Prescription (Final Exercise Prescription Changes):   Nutrition:  Target Goals: Understanding of nutrition guidelines, daily intake of sodium 1500mg , cholesterol 200mg , calories 30% from fat and 7% or less from saturated fats, daily to have 5 or more servings of fruits and vegetables.  Biometrics: Pre Biometrics - 10/25/17 1414      Pre Biometrics   Height  5\' 8"  (1.727 m)    Weight  179 lb 3.2 oz (81.3 kg)    Waist Circumference  39 inches    Hip Circumference  37 inches    Waist to Hip Ratio  1.05 %    BMI (Calculated)  27.25    Triceps Skinfold  7 mm    % Body Fat  23.36 %    Grip Strength  79.73 kg    Flexibility  14.83 in    Single Leg Stand  60 seconds        Nutrition Therapy Plan and Nutrition  Goals: Nutrition Therapy & Goals - 10/25/17 1500      Personal Nutrition Goals   Personal Goal #2  Patient is eating a diabetic diet anlong with making heart healthy choices.        Nutrition Assessments: Nutrition Assessments - 10/25/17 1501      MEDFICTS Scores   Pre Score  27       Nutrition Goals Re-Evaluation:   Nutrition Goals Discharge (Final Nutrition Goals Re-Evaluation):   Psychosocial: Target Goals: Acknowledge presence or absence of significant depression and/or stress, maximize coping skills, provide positive support system. Participant is able to verbalize types and ability to use techniques and skills needed for reducing stress and depression.  Initial Review & Psychosocial Screening: Initial Psych Review & Screening - 10/25/17 1506      Initial Review   Current issues with  None Identified    Source of Stress Concerns  None Identified      Family Dynamics   Good Support System?  Yes      Barriers   Psychosocial barriers to participate in program  There are no identifiable barriers or psychosocial needs.      Screening Interventions   Interventions  Encouraged to exercise    Expected Outcomes  Short Term goal: Identification and review with participant of any Quality of Life or Depression concerns found by scoring the questionnaire.;Long Term goal: The participant improves quality of Life and PHQ9 Scores as seen by post scores and/or verbalization of changes       Quality of Life Scores: Quality of Life - 10/25/17 1415      Quality of Life Scores   Health/Function Pre  20.4 %    Socioeconomic Pre  26.86 %    Psych/Spiritual Pre  23.79 %    Family Pre  28.8 %    GLOBAL Pre  23.66 %      Scores of 19 and below usually indicate a poorer quality of life in these areas.  A difference of  2-3 points is a clinically meaningful difference.  A difference of 2-3 points in the total score of the Quality of Life Index has been associated with significant  improvement in overall quality of life, self-image, physical symptoms, and general health in studies assessing change in quality of life.  PHQ-9: Recent  Review Flowsheet Data    Depression screen [facility] 2/9 10/25/2017 04/26/2017   Decreased Interest 0 0   Down, Depressed, Hopeless 0 0   PHQ - 2 Score 0 0   Altered sleeping 1 -   Tired, decreased energy 1 -   Change in appetite 0 -   Feeling bad or failure about yourself  0 -   Trouble concentrating 0 -   Moving slowly or fidgety/restless 0 -   Suicidal thoughts 0 -   PHQ-9 Score 2 -   Difficult doing work/chores Not difficult at all -     Interpretation of Total Score  Total Score Depression Severity:  1-4 = Minimal depression, 5-9 = Mild depression, 10-14 = Moderate depression, 15-19 = Moderately severe depression, 20-27 = Severe depression   Psychosocial Evaluation and Intervention: Psychosocial Evaluation - 10/25/17 1507      Psychosocial Evaluation & Interventions   Interventions  Encouraged to exercise with the program and follow exercise prescription    Continue Psychosocial Services   No Follow up required       Psychosocial Re-Evaluation:   Psychosocial Discharge (Final Psychosocial Re-Evaluation):   Vocational Rehabilitation: Provide vocational rehab assistance to qualifying candidates.   Vocational Rehab Evaluation & Intervention: Vocational Rehab - 10/25/17 1451      Initial Vocational Rehab Evaluation & Intervention   Assessment shows need for Vocational Rehabilitation  No       Education: Education Goals: Education classes will be provided on a weekly basis, covering required topics. Participant will state understanding/return demonstration of topics presented.  Learning Barriers/Preferences: Learning Barriers/Preferences - 10/25/17 1448      Learning Barriers/Preferences   Learning Barriers  None    Learning Preferences  Skilled Demonstration;Group Instruction;Verbal Instruction       Education  Topics: Hypertension, Hypertension Reduction -Define heart disease and high blood pressure. Discus how high blood pressure affects the body and ways to reduce high blood pressure.   Exercise and Your Heart -Discuss why it is important to exercise, the FITT principles of exercise, normal and abnormal responses to exercise, and how to exercise safely.   Angina -Discuss definition of angina, causes of angina, treatment of angina, and how to decrease risk of having angina.   Cardiac Medications -Review what the following cardiac medications are used for, how they affect the body, and side effects that may occur when taking the medications.  Medications include Aspirin, Beta blockers, calcium channel blockers, ACE Inhibitors, angiotensin receptor blockers, diuretics, digoxin, and antihyperlipidemics.   Congestive Heart Failure -Discuss the definition of CHF, how to live with CHF, the signs and symptoms of CHF, and how keep track of weight and sodium intake.   Heart Disease and Intimacy -Discus the effect sexual activity has on the heart, how changes occur during intimacy as we age, and safety during sexual activity.   Smoking Cessation / COPD -Discuss different methods to quit smoking, the health benefits of quitting smoking, and the definition of COPD.   Nutrition I: Fats -Discuss the types of cholesterol, what cholesterol does to the heart, and how cholesterol levels can be controlled.   Nutrition II: Labels -Discuss the different components of food labels and how to read food label   Heart Parts/Heart Disease and PAD -Discuss the anatomy of the heart, the pathway of blood circulation through the heart, and these are affected by heart disease.   Stress I: Signs and Symptoms -Discuss the causes of stress, how stress may lead to anxiety and depression,  and ways to limit stress.   Stress II: Relaxation -Discuss different types of relaxation techniques to limit  stress.   Warning Signs of Stroke / TIA -Discuss definition of a stroke, what the signs and symptoms are of a stroke, and how to identify when someone is having stroke.   Knowledge Questionnaire Score: Knowledge Questionnaire Score - 10/25/17 1449      Knowledge Questionnaire Score   Pre Score  21/24       Core Components/Risk Factors/Patient Goals at Admission: Personal Goals and Risk Factors at Admission - 10/25/17 1502      Core Components/Risk Factors/Patient Goals on Admission   Intervention  Weight Management: Provide education and appropriate resources to help participant work on and attain dietary goals.    Admit Weight  179 lb 3.2 oz (81.3 kg)    Goal Weight: Short Term  174 lb 3.2 oz (79 kg)    Goal Weight: Long Term  169 lb 3.2 oz (76.7 kg)    Expected Outcomes  Short Term: Continue to assess and modify interventions until short term weight is achieved;Long Term: Adherence to nutrition and physical activity/exercise program aimed toward attainment of established weight goal    Diabetes  -- Has diabetes under control.    Personal Goal Other  Yes    Personal Goal  Regain heart health, lose 5-10 pounds.     Intervention  Attend CR 3 x week and supplement with home exercise 2 x week.     Expected Outcomes  Achieve personal goals.        Core Components/Risk Factors/Patient Goals Review:    Core Components/Risk Factors/Patient Goals at Discharge (Final Review):    ITP Comments: ITP Comments    Row Name 10/25/17 1453 10/26/17 1318         ITP Comments  Mr. Mashek is a 62 year old patient with reocurring stent placement. 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 He did not come to CR at that time. He had another episode where he needed another stent. Patient is now ready to attend CR to regain is strength and regain heart health.   Patient new to program. Plans to start Monday 10/31/17.         Comments: ITP 30 Day REVIEW Patient new to program. Plans to start  Monday 10/31/17.

## 2017-10-31 ENCOUNTER — Encounter (HOSPITAL_COMMUNITY)
Admission: RE | Admit: 2017-10-31 | Discharge: 2017-10-31 | Disposition: A | Discharge status: Home / Self Care | Payer: 59 | Source: Ambulatory Visit | Attending: Cardiovascular Disease | Admitting: Cardiovascular Disease

## 2017-10-31 DIAGNOSIS — Z955 Presence of coronary angioplasty implant and graft: Secondary | ICD-10-CM

## 2017-10-31 DIAGNOSIS — Z48812 Encounter for surgical aftercare following surgery on the circulatory system: Secondary | ICD-10-CM | POA: Diagnosis not present

## 2017-10-31 NOTE — Progress Notes (Signed)
Daily Session Note  Patient Details  Name: [name] Foster MRN: [id number] Date of Birth: 02-27-1956 Referring Provider:     CARDIAC REHAB PHASE II ORIENTATION from 10/25/2017 in [place]  Referring Provider  Dr. Burt Knack      Encounter Date: 10/31/2017  Check In: Session Check In - 10/31/17 0930      Check-In   Location  [facility]    Staff Present  Russella Dar, MS, EP, [facility], Exercise Physiologist;Gregory Luther Parody, BS, EP, Exercise Physiologist    Supervising physician immediately available to respond to emergencies  See telemetry face sheet for immediately available MD    Medication changes reported      No    Fall or balance concerns reported     No    Warm-up and Cool-down  Performed as group-led instruction    Resistance Training Performed  Yes    VAD Patient?  No      Pain Assessment   Currently in Pain?  No/denies    Pain Score  0-No pain    Multiple Pain Sites  No       Capillary Blood Glucose: No results found for this or any previous visit (from the past 24 hour(s)).    Social History   Tobacco Use  Smoking Status Former Smoker  . Packs/day: 1.00  . Last attempt to quit: 03/25/2017  . Years since quitting: 0.6  Smokeless Tobacco Never Used    Goals Met:  Independence with exercise equipment Exercise tolerated well No report of cardiac concerns or symptoms Strength training completed today  Goals Unmet:  Not Applicable  Comments: Check out 1030.   Dr. Kate Sable is Medical Director for [facility] Cardiac and Pulmonary Rehab.

## 2017-11-02 ENCOUNTER — Encounter (HOSPITAL_COMMUNITY)
Admission: RE | Admit: 2017-11-02 | Discharge: 2017-11-02 | Disposition: A | Discharge status: Home / Self Care | Payer: 59 | Source: Ambulatory Visit | Attending: Cardiovascular Disease | Admitting: Cardiovascular Disease

## 2017-11-02 DIAGNOSIS — Z48812 Encounter for surgical aftercare following surgery on the circulatory system: Secondary | ICD-10-CM | POA: Diagnosis not present

## 2017-11-02 DIAGNOSIS — Z955 Presence of coronary angioplasty implant and graft: Secondary | ICD-10-CM

## 2017-11-02 NOTE — Progress Notes (Signed)
Daily Session Note  Patient Details  Name: [name] Foster MRN: [id number] Date of Birth: December 08, 1955 Referring Provider:     CARDIAC REHAB PHASE II ORIENTATION from 10/25/2017 in [place]  Referring Provider  Dr. Burt Knack      Encounter Date: 11/02/2017  Check In: Session Check In - 11/02/17 0943      Check-In   Location  [facility]    Staff Present  Diane Angelina Pih, MS, EP, [facility], Exercise Physiologist;Odell Choung Luther Parody, BS, EP, Exercise Physiologist;Debra Wynetta Emery, RN, BSN    Supervising physician immediately available to respond to emergencies  See telemetry face sheet for immediately available MD    Medication changes reported      No    Fall or balance concerns reported     No    Warm-up and Cool-down  Performed as group-led instruction    Resistance Training Performed  Yes    VAD Patient?  No      Pain Assessment   Currently in Pain?  No/denies    Pain Score  0-No pain    Multiple Pain Sites  No       Capillary Blood Glucose: No results found for this or any previous visit (from the past 24 hour(s)).    Social History   Tobacco Use  Smoking Status Former Smoker  . Packs/day: 1.00  . Last attempt to quit: 03/25/2017  . Years since quitting: 0.6  Smokeless Tobacco Never Used    Goals Met:  Independence with exercise equipment Exercise tolerated well No report of cardiac concerns or symptoms Strength training completed today  Goals Unmet:  Not Applicable  Comments: Check out 1030   Dr. Kate Sable is Medical Director for [name] and Pulmonary Rehab.

## 2017-11-02 NOTE — Progress Notes (Signed)
Patient received the take home exercise plan today 11/02/2017. THR was addressed as were safety guidelines for being active when not in CR. Patient demonstrated an understanding and was encouraged to ask any future questions as they arise.

## 2017-11-04 ENCOUNTER — Encounter (HOSPITAL_COMMUNITY)
Admission: RE | Admit: 2017-11-04 | Discharge: 2017-11-04 | Disposition: A | Discharge status: Home / Self Care | Payer: 59 | Source: Ambulatory Visit | Attending: Cardiovascular Disease | Admitting: Cardiovascular Disease

## 2017-11-04 DIAGNOSIS — Z955 Presence of coronary angioplasty implant and graft: Secondary | ICD-10-CM

## 2017-11-04 DIAGNOSIS — Z48812 Encounter for surgical aftercare following surgery on the circulatory system: Secondary | ICD-10-CM | POA: Diagnosis not present

## 2017-11-04 NOTE — Progress Notes (Signed)
Daily Session Note  Patient Details  Name: [name] Foster MRN: [id number] Date of Birth: April 10, 1956 Referring Provider:     CARDIAC REHAB PHASE II ORIENTATION from 10/25/2017 in [place]  Referring Provider  Dr. Burt Knack      Encounter Date: 11/04/2017  Check In: Session Check In - 11/04/17 0930      Check-In   Location  [facility]    Staff Present  Diane Angelina Pih, MS, EP, [facility], Exercise Physiologist;Gregory Luther Parody, BS, EP, Exercise Physiologist;Lylianna Fraiser Wynetta Emery, RN, BSN    Supervising physician immediately available to respond to emergencies  See telemetry face sheet for immediately available MD    Medication changes reported      No    Fall or balance concerns reported     No    Warm-up and Cool-down  Performed as group-led instruction    Resistance Training Performed  Yes    VAD Patient?  No      Pain Assessment   Currently in Pain?  No/denies    Pain Score  0-No pain    Multiple Pain Sites  No       Capillary Blood Glucose: No results found for this or any previous visit (from the past 24 hour(s)).    Social History   Tobacco Use  Smoking Status Former Smoker  . Packs/day: 1.00  . Last attempt to quit: 03/25/2017  . Years since quitting: 0.6  Smokeless Tobacco Never Used    Goals Met:  Independence with exercise equipment Exercise tolerated well No report of cardiac concerns or symptoms Strength training completed today  Goals Unmet:  Not Applicable  Comments: Check out 1030.   Dr. Kate Sable is Medical Director for [facility] Cardiac and Pulmonary Rehab.

## 2017-11-07 ENCOUNTER — Encounter (HOSPITAL_COMMUNITY)
Admission: RE | Admit: 2017-11-07 | Discharge: 2017-11-07 | Disposition: A | Discharge status: Home / Self Care | Payer: 59 | Source: Ambulatory Visit | Attending: Cardiovascular Disease | Admitting: Cardiovascular Disease

## 2017-11-07 DIAGNOSIS — Z955 Presence of coronary angioplasty implant and graft: Secondary | ICD-10-CM

## 2017-11-07 DIAGNOSIS — Z48812 Encounter for surgical aftercare following surgery on the circulatory system: Secondary | ICD-10-CM | POA: Diagnosis not present

## 2017-11-07 NOTE — Progress Notes (Signed)
Daily Session Note  Patient Details  Name: [name] Foster MRN: [id number] Date of Birth: November 05, 1955 Referring Provider:     CARDIAC REHAB PHASE II ORIENTATION from 10/25/2017 in [place]  Referring Provider  Dr. Burt Knack      Encounter Date: 11/07/2017  Check In: Session Check In - 11/07/17 1009      Check-In   Location  [facility]    Staff Present  Diane Angelina Pih, MS, EP, [facility], Exercise Physiologist;Adonias Demore Luther Parody, BS, EP, Exercise Physiologist;Debra Wynetta Emery, RN, BSN    Supervising physician immediately available to respond to emergencies  See telemetry face sheet for immediately available MD    Medication changes reported      No    Fall or balance concerns reported     No    Tobacco Cessation  No Change    Warm-up and Cool-down  Performed on first and last piece of equipment    Resistance Training Performed  Yes    VAD Patient?  No      Pain Assessment   Currently in Pain?  No/denies    Pain Score  0-No pain    Multiple Pain Sites  No       Capillary Blood Glucose: No results found for this or any previous visit (from the past 24 hour(s)).    Social History   Tobacco Use  Smoking Status Former Smoker  . Packs/day: 1.00  . Last attempt to quit: 03/25/2017  . Years since quitting: 0.6  Smokeless Tobacco Never Used    Goals Met:  Independence with exercise equipment Exercise tolerated well No report of cardiac concerns or symptoms Strength training completed today  Goals Unmet:  Not Applicable  Comments: Check out 1030   Dr. Kate Sable is Medical Director for [name].

## 2017-11-09 ENCOUNTER — Telehealth: Payer: Self-pay | Admitting: Cardiovascular Disease

## 2017-11-09 ENCOUNTER — Encounter (HOSPITAL_COMMUNITY)
Admission: RE | Admit: 2017-11-09 | Discharge: 2017-11-09 | Disposition: A | Discharge status: Home / Self Care | Payer: 59 | Source: Ambulatory Visit | Attending: Cardiovascular Disease | Admitting: Cardiovascular Disease

## 2017-11-09 DIAGNOSIS — Z48812 Encounter for surgical aftercare following surgery on the circulatory system: Secondary | ICD-10-CM | POA: Diagnosis not present

## 2017-11-09 DIAGNOSIS — Z955 Presence of coronary angioplasty implant and graft: Secondary | ICD-10-CM

## 2017-11-09 MED ORDER — LOSARTAN POTASSIUM 50 MG PO TABS: 50.0000 mg | ORAL_TABLET | Freq: Every day | ORAL | 11 refills | Status: DC

## 2017-11-09 NOTE — Telephone Encounter (Signed)
Diane at [facility] called to report the patient has high blood pressure. He has been attending sessions for 3 weeks and BP is getting higher and higher.  Friday, his resting BP was 192/90. His BP during exercise was 190/90 and again at rest was 170/90. Monday, his resting BP was 180/80. His BP during exercise was 168/82 and again at rest was 160/72. Today his resting BP is 180/70. His resting HR is usually in the 60s. Today it is 79. She reports the patient is totally asymptomatic. Confirmed with her he took his medications this morning.  Informed her Dr. Burt Knack will be consulted about medication recommendations and the patient will be called later today with instruction.

## 2017-11-09 NOTE — Telephone Encounter (Signed)
Need to review medicines with him.  There is some discrepancy about whether he is taking losartan.  Would have him take losartan 50 mg daily.  I suspect he will need another agent and if he remains above goal of 140/90 would add amlodipine 5 mg daily after 1 week.

## 2017-11-09 NOTE — Telephone Encounter (Signed)
Confirmed with patient he is taking Losartan 25 mg daily. Instructed him to INCREASE LOSARTAN to 50 mg daily. He will check BP 1-2 hours after taking medications and will call in a week to share readings.  He understands if his BP is still to elevated he may start another medication. He was grateful for call and agrees with treatment plan.

## 2017-11-09 NOTE — Telephone Encounter (Signed)
New Message     Pt c/o BP issue: STAT if pt c/o blurred vision, one-sided weakness or slurred speech  1. What are your last 5 BP readings? 180/82 resting  170/70 today   2. Are you having any other symptoms (ex. Dizziness, headache, blurred vision, passed out)? no  3. What is your BP issue? At cardiac rehab and they are concerned his bp is really high

## 2017-11-09 NOTE — Progress Notes (Signed)
Daily Session Note  Patient Details  Name: [name] Foster MRN: [id number] Date of Birth: 12-27-1955 Referring Provider:     CARDIAC REHAB PHASE II ORIENTATION from 10/25/2017 in [place]  Referring Provider  Dr. Burt Knack      Encounter Date: 11/09/2017  Check In: Session Check In - 11/09/17 0950      Check-In   Location  [facility]    Staff Present  Diane Angelina Pih, MS, EP, [facility], Exercise Physiologist;Arvie Villarruel Luther Parody, BS, EP, Exercise Physiologist;Debra Wynetta Emery, RN, BSN    Supervising physician immediately available to respond to emergencies  See telemetry face sheet for immediately available MD    Medication changes reported      No    Fall or balance concerns reported     No    Tobacco Cessation  No Change    Warm-up and Cool-down  Performed on first and last piece of equipment    Resistance Training Performed  Yes    VAD Patient?  No      Pain Assessment   Currently in Pain?  No/denies    Pain Score  0-No pain    Multiple Pain Sites  No       Capillary Blood Glucose: No results found for this or any previous visit (from the past 24 hour(s)).    Social History   Tobacco Use  Smoking Status Former Smoker  . Packs/day: 1.00  . Last attempt to quit: 03/25/2017  . Years since quitting: 0.6  Smokeless Tobacco Never Used    Goals Met:  Independence with exercise equipment Exercise tolerated well No report of cardiac concerns or symptoms Strength training completed today  Goals Unmet:  Not Applicable  Comments: Check out 1030   Dr. Kate Sable is Medical Director for [facility] and [facility].

## 2017-11-11 ENCOUNTER — Encounter (HOSPITAL_COMMUNITY)
Admission: RE | Admit: 2017-11-11 | Discharge: 2017-11-11 | Disposition: A | Discharge status: Home / Self Care | Payer: 59 | Source: Ambulatory Visit | Attending: Cardiovascular Disease | Admitting: Cardiovascular Disease

## 2017-11-11 DIAGNOSIS — Z48812 Encounter for surgical aftercare following surgery on the circulatory system: Secondary | ICD-10-CM | POA: Insufficient documentation

## 2017-11-11 DIAGNOSIS — Z955 Presence of coronary angioplasty implant and graft: Secondary | ICD-10-CM | POA: Diagnosis present

## 2017-11-11 NOTE — Progress Notes (Signed)
Daily Session Note  Patient Details  Name: [name] Foster MRN: [id number] Date of Birth: [birth year] Referring Provider:     CARDIAC REHAB PHASE II ORIENTATION from 10/25/2017 in [place]  Referring Provider  Dr. Burt Knack      Encounter Date: 11/11/2017  Check In: Session Check In - 11/11/17 1010      Check-In   Location  [facility]    Staff Present  Diane Angelina Pih, MS, EP, [facility], Exercise Physiologist;Toriana Sponsel Luther Parody, BS, EP, Exercise Physiologist;Debra Wynetta Emery, RN, BSN    Supervising physician immediately available to respond to emergencies  See telemetry face sheet for immediately available MD    Medication changes reported      Yes    Comments  Losarton was increased from 56m to 54mdue to BP    Fall or balance concerns reported     No    Tobacco Cessation  No Change    Warm-up and Cool-down  Performed on first and last piece of equipment    Resistance Training Performed  Yes    VAD Patient?  No      Pain Assessment   Currently in Pain?  No/denies    Pain Score  0-No pain    Multiple Pain Sites  No       Capillary Blood Glucose: No results found for this or any previous visit (from the past 24 hour(s)).  Exercise Prescription Changes - 11/10/17 1500      Response to Exercise   Blood Pressure (Admit)  170/100    Blood Pressure (Exercise)  172/70    Blood Pressure (Exit)  150/66    Heart Rate (Admit)  74 bpm    Heart Rate (Exercise)  104 bpm    Heart Rate (Exit)  77 bpm    Rating of Perceived Exertion (Exercise)  11    Duration  Progress to 30 minutes of  aerobic without signs/symptoms of physical distress    Intensity  TH[facility] New [id number]    Progression   Progression  Continue to progress workloads to maintain intensity without signs/symptoms of physical distress.      Resistance Training   Training Prescription  Yes    Weight  1    Reps  10-15      Treadmill   MPH  2    Grade  0    Minutes  15    METs  2.5      Recumbant Elliptical   Level  1    RPM  62    Watts  80    Minutes  20    METs  4.5      Home Exercise Plan   Plans to continue exercise at  Home (comment)    Frequency  Add 2 additional days to program exercise sessions.    Initial Home Exercises Provided  11/02/17       Social History   Tobacco Use  Smoking Status Former Smoker  . Packs/day: 1.00  . Last attempt to quit: 03/25/2017  . Years since quitting: 0.6  Smokeless Tobacco Never Used    Goals Met:  Independence with exercise equipment Exercise tolerated well No report of cardiac concerns or symptoms Strength training completed today  Goals Unmet:  Not Applicable  Comments: Check out 1030   Dr. SuKate Sables Medical Director for [facility].

## 2017-11-14 ENCOUNTER — Encounter (HOSPITAL_COMMUNITY)
Admission: RE | Admit: 2017-11-14 | Discharge: 2017-11-14 | Disposition: A | Discharge status: Home / Self Care | Payer: 59 | Source: Ambulatory Visit | Attending: Cardiovascular Disease | Admitting: Cardiovascular Disease

## 2017-11-14 DIAGNOSIS — Z48812 Encounter for surgical aftercare following surgery on the circulatory system: Secondary | ICD-10-CM | POA: Diagnosis not present

## 2017-11-14 DIAGNOSIS — Z955 Presence of coronary angioplasty implant and graft: Secondary | ICD-10-CM

## 2017-11-14 NOTE — Progress Notes (Signed)
Daily Session Note  Patient Details  Name: RAMEL TOBON MRN: [id number] Date of Birth: 1956/05/09 Referring Provider:     CARDIAC REHAB PHASE II ORIENTATION from 10/25/2017 in [place]  Referring Provider  Dr. Burt Knack      Encounter Date: 11/14/2017  Check In: Session Check In - 11/14/17 0940      Check-In   Location  [facility]    Staff Present  Diane Angelina Pih, MS, EP, [facility], Exercise Physiologist;Amita Atayde Luther Parody, BS, EP, Exercise Physiologist;Debra Wynetta Emery, RN, BSN    Supervising physician immediately available to respond to emergencies  See telemetry face sheet for immediately available MD    Medication changes reported      Yes    Fall or balance concerns reported     No    Tobacco Cessation  No Change    Warm-up and Cool-down  Performed on first and last piece of equipment    Resistance Training Performed  Yes    VAD Patient?  No      Pain Assessment   Currently in Pain?  No/denies    Pain Score  0-No pain    Multiple Pain Sites  No       Capillary Blood Glucose: No results found for this or any previous visit (from the past 24 hour(s)).    Social History   Tobacco Use  Smoking Status Former Smoker  . Packs/day: 1.00  . Last attempt to quit: 03/25/2017  . Years since quitting: 0.6  Smokeless Tobacco Never Used    Goals Met:  Independence with exercise equipment Exercise tolerated well No report of cardiac concerns or symptoms Strength training completed today  Goals Unmet:  Not Applicable  Comments: Check out 1030   Dr. Kate Sable is Medical Director for [facility] and [facility].

## 2017-11-14 NOTE — Progress Notes (Signed)
Cardiac Individual Treatment Plan  Patient Details  Name: [name] Foster MRN: [id number] Date of Birth: 03/31/1956 Referring Provider:     CARDIAC REHAB PHASE II ORIENTATION from 10/25/2017 in [facility]  Referring Provider  Dr. Burt Knack      Initial Encounter Date:    CARDIAC REHAB PHASE II ORIENTATION from 10/25/2017 in [facility]  Date  10/25/17  Referring Provider  Dr. Burt Knack      Visit Diagnosis: Status post coronary artery stent placement  Patient's Home Medications on Admission:  Current Outpatient Medications:  .  aspirin EC 81 MG EC tablet, Take 1 tablet (81 mg total) by mouth daily., Disp: 30 tablet, Rfl: 11 .  atorvastatin (LIPITOR) 80 MG tablet, Take 1 tablet (80 mg total) by mouth daily at 6 PM., Disp: 30 tablet, Rfl: 11 .  gabapentin (NEURONTIN) 300 MG capsule, Take 300 mg by mouth 3 (three) times daily as needed (NERVE PAIN). , Disp: , Rfl:  .  HYDROcodone-acetaminophen (NORCO) 7.5-325 MG tablet, Take 1 tablet by mouth every 6 (six) hours as needed for moderate pain., Disp: , Rfl:  .  isosorbide mononitrate (IMDUR) 30 MG 24 hr tablet, Take 0.5 tablets (15 mg total) by mouth daily., Disp: 15 tablet, Rfl: 11 .  losartan (COZAAR) 50 MG tablet, Take 1 tablet (50 mg total) by mouth daily., Disp: 30 tablet, Rfl: 11 .  metFORMIN (GLUCOPHAGE) 1000 MG tablet, Take 500 mg by mouth 2 (two) times daily with a meal., Disp: , Rfl: 6 .  metoprolol tartrate (LOPRESSOR) 25 MG tablet, Take 1 tablet (25 mg total) by mouth 2 (two) times daily., Disp: 60 tablet, Rfl: 11 .  nitroGLYCERIN (NITROSTAT) 0.4 MG SL tablet, Place 1 tablet (0.4 mg total) under the tongue every 5 (five) minutes x 3 doses as needed for chest pain., Disp: 25 tablet, Rfl: 11 .  ticagrelor (BRILINTA) 90 MG TABS tablet, Take 1 tablet (90 mg total) by mouth 2 (two) times daily., Disp: 60 tablet, Rfl: 0 .  zolpidem (AMBIEN) 10 MG tablet, Take 10 mg by mouth at bedtime as needed  for sleep. , Disp: , Rfl:   Past Medical History: Past Medical History:  Diagnosis Date  . CAD in native artery    a. 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 DES to left PDA 09/20/17  . Diabetes mellitus without complication ([place])   . High cholesterol   . Hyperlipidemia   . Hypertension   . Insomnia   . Pulmonary hypertension ([place])    a. TTE 03/25/17; EF 60-65%, nl WM, LV diastolic fxn nl, trivial AI, RV cavity size, wall thickness, and sys fxn nl, trivial TR, PASP 39 mmHg, trivial pericardial effusion  . Sleep apnea    could not use a cpap  . Wears dentures    full top-partial bottom  . Wears glasses     Tobacco Use: Social History   Tobacco Use  Smoking Status Former Smoker  . Packs/day: 1.00  . Last attempt to quit: 03/25/2017  . Years since quitting: 0.6  Smokeless Tobacco Never Used    Labs: Recent Review Scientist, physiological    Labs for ITP Cardiac and Pulmonary Rehab Latest Ref Rng & Units 03/25/2017 05/13/2017   Cholestrol 100 - 199 mg/dL 163 81(L)   LDLCALC 0 - 99 mg/dL 92 41   HDL >  39 mg/dL 25(L) 24(L)   Trlycerides 0 - 149 mg/dL 229(H) 82   Hemoglobin A1c 4.8 - 5.6 % 9.4(H) -   PHART 7.350 - 7.450 7.206(L) -   PCO2ART 32.0 - 48.0 mmHg 53.8(H) -   HCO3 20.0 - 28.0 mmol/L 21.3 -   TCO2 0 - 100 mmol/L 23 -   ACIDBASEDEF 0.0 - 2.0 mmol/L 7.0(H) -   O2SAT % 99.0 -      Capillary Blood Glucose: Lab Results  Component Value Date   GLUCAP 92 09/20/2017   GLUCAP 128 (H) 09/20/2017   GLUCAP 115 (H) 03/27/2017   GLUCAP 105 (H) 03/27/2017   GLUCAP 106 (H) 03/27/2017     Exercise Target Goals:    Exercise Program Goal: Individual exercise prescription set using results from initial 6 min walk test and THRR while considering  patient's activity barriers and safety.   Exercise Prescription Goal: Starting with aerobic activity 30 plus minutes  a day, 3 days per week for initial exercise prescription. Provide home exercise prescription and guidelines that participant acknowledges understanding prior to discharge.  Activity Barriers & Risk Stratification: Activity Barriers & Cardiac Risk Stratification - 10/25/17 1413      Activity Barriers & Cardiac Risk Stratification   Activity Barriers  None    Cardiac Risk Stratification  High       6 Minute Walk: 6 Minute Walk    Row Name 10/25/17 1412         6 Minute Walk   Phase  Initial     Distance  1550 feet     Distance % Change  0 %     Distance Feet Change  0 ft     Walk Time  6 minutes     # of Rest Breaks  0     MPH  2.93     METS  3.24     RPE  12     Perceived Dyspnea   13     VO2 Peak  14.79     Symptoms  No     Resting HR  60 bpm     Resting BP  172/86     Resting Oxygen Saturation   97 %     Exercise Oxygen Saturation  during 6 min walk  97 %     Max Ex. HR  90 bpm     Max Ex. BP  208/90     2 Minute Post BP  180/84        Oxygen Initial Assessment:   Oxygen Re-Evaluation:   Oxygen Discharge (Final Oxygen Re-Evaluation):   Initial Exercise Prescription: Initial Exercise Prescription - 10/25/17 1400      Date of Initial Exercise RX and Referring Provider   Date  10/25/17    Referring Provider  Dr. Burt Knack      Treadmill   MPH  2    Grade  0    Minutes  15    METs  2.5      Recumbant Elliptical   Level  1    RPM  45    Watts  58    Minutes  20    METs  3.2      Prescription Details   Frequency (times per week)  3    Duration  Progress to 30 minutes of continuous aerobic without signs/symptoms of physical distress      Intensity   THRR 40-80% of Max Heartrate  [phone]    Ratings  of Perceived Exertion  11-13    Perceived Dyspnea  0-4      Progression   Progression  Continue progressive overload as per policy without signs/symptoms or physical distress.      Resistance Training   Training Prescription  Yes    Weight  1     Reps  10-15       Perform Capillary Blood Glucose checks as needed.  Exercise Prescription Changes:  Exercise Prescription Changes    Row Name 11/02/17 0900 11/10/17 1500           Response to Exercise   Blood Pressure (Admit)  -  170/100      Blood Pressure (Exercise)  -  172/70      Blood Pressure (Exit)  -  150/66      Heart Rate (Admit)  -  74 bpm      Heart Rate (Exercise)  -  104 bpm      Heart Rate (Exit)  -  77 bpm      Rating of Perceived Exertion (Exercise)  -  11      Duration  -  Progress to 30 minutes of  aerobic without signs/symptoms of physical distress      Intensity  -  THRR New [phone]        Progression   Progression  -  Continue to progress workloads to maintain intensity without signs/symptoms of physical distress.        Resistance Training   Training Prescription  Yes  Yes      Weight  1  1      Reps  10-15  10-15        Treadmill   MPH  2  2      Grade  0  0      Minutes  15  15      METs  2.5  2.5        Recumbant Elliptical   Level  1  1      RPM  45  62      Watts  58  80      Minutes  20  20      METs  3.2  4.5        Home Exercise Plan   Plans to continue exercise at  Home (comment)  Home (comment)      Frequency  Add 2 additional days to program exercise sessions.  Add 2 additional days to program exercise sessions.      Initial Home Exercises Provided  11/02/17  11/02/17         Exercise Comments:  Exercise Comments    Row Name 11/02/17 4332 11/10/17 1532         Exercise Comments  Patient received the take home exercise plan today 11/02/2017. THR was addressed as were safety guidelines for being active when not in CR. Patient demonstrated an understanding and was encouraged to ask any future questions as they arise.   Patient is doing very well in CR and has maintained his speed on the treadmill and has increased his watts and RPMs on the recumbent elliptical. Patient has only recently started the program and has not been  progressed much due to his abnormally high BP. His physicians have been contacted and he will be progressed in time throughout the remainder of the prorgam.          Exercise Goals and Review:  Exercise Goals  [name] Foster Name 10/25/17 1413             Exercise Goals   Increase Physical Activity  Yes       Intervention  Provide advice, education, support and counseling about physical activity/exercise needs.;Develop an individualized exercise prescription for aerobic and resistive training based on initial evaluation findings, risk stratification, comorbidities and participant's personal goals.       Expected Outcomes  Short Term: Attend rehab on a regular basis to increase amount of physical activity.       Increase Strength and Stamina  Yes       Intervention  Provide advice, education, support and counseling about physical activity/exercise needs.;Develop an individualized exercise prescription for aerobic and resistive training based on initial evaluation findings, risk stratification, comorbidities and participant's personal goals.       Expected Outcomes  Short Term: Increase workloads from initial exercise prescription for resistance, speed, and METs.       Able to understand and use rate of perceived exertion (RPE) scale  Yes       Intervention  Provide education and explanation on how to use RPE scale       Expected Outcomes  Short Term: Able to use RPE daily in rehab to express subjective intensity level;Long Term:  Able to use RPE to guide intensity level when exercising independently       Able to understand and use Dyspnea scale  Yes       Intervention  Provide education and explanation on how to use Dyspnea scale       Expected Outcomes  Long Term: Able to use Dyspnea scale to guide intensity level when exercising independently;Short Term: Able to use Dyspnea scale daily in rehab to express subjective sense of shortness of breath during exertion       Knowledge and understanding of  Target Heart Rate Range (THRR)  Yes       Intervention  Provide education and explanation of THRR including how the numbers were predicted and where they are located for reference       Expected Outcomes  Short Term: Able to state/look up THRR;Short Term: Able to use daily as guideline for intensity in rehab;Long Term: Able to use THRR to govern intensity when exercising independently       Able to check pulse independently  Yes       Intervention  Review the importance of being able to check your own pulse for safety during independent exercise;Provide education and demonstration on how to check pulse in carotid and radial arteries.       Expected Outcomes  Short Term: Able to explain why pulse checking is important during independent exercise;Long Term: Able to check pulse independently and accurately       Understanding of Exercise Prescription  Yes       Intervention  Provide education, explanation, and written materials on patient's individual exercise prescription       Expected Outcomes  Long Term: Able to explain home exercise prescription to exercise independently;Short Term: Able to explain program exercise prescription          Exercise Goals Re-Evaluation : Exercise Goals Re-Evaluation    [name] Foster Name 11/10/17 1530             Exercise Goal Re-Evaluation   Exercise Goals Review  Increase Physical Activity;Able to understand and use rate of perceived exertion (RPE) scale;Knowledge and understanding of Target Heart Rate Range (THRR);Able to understand and use Dyspnea scale;Understanding  of Exercise Prescription       Comments  Patient is doing very well in CR and has maintained his speed on the treadmill and has increased his watts and RPMs on the recumbent elliptical. Patient has only recently started the program and has not been progressed much due to his abnormally high BP. His physicians have been contacted and he will be progressed in time throughout the remainder of the prorgam.         Expected Outcomes  Patient wishes to lose weight and to become heart healthy again.            Discharge Exercise Prescription (Final Exercise Prescription Changes): Exercise Prescription Changes - 11/10/17 1500      Response to Exercise   Blood Pressure (Admit)  170/100    Blood Pressure (Exercise)  172/70    Blood Pressure (Exit)  150/66    Heart Rate (Admit)  74 bpm    Heart Rate (Exercise)  104 bpm    Heart Rate (Exit)  77 bpm    Rating of Perceived Exertion (Exercise)  11    Duration  Progress to 30 minutes of  aerobic without signs/symptoms of physical distress    Intensity  THRR New [phone]      Progression   Progression  Continue to progress workloads to maintain intensity without signs/symptoms of physical distress.      Resistance Training   Training Prescription  Yes    Weight  1    Reps  10-15      Treadmill   MPH  2    Grade  0    Minutes  15    METs  2.5      Recumbant Elliptical   Level  1    RPM  62    Watts  80    Minutes  20    METs  4.5      Home Exercise Plan   Plans to continue exercise at  Home (comment)    Frequency  Add 2 additional days to program exercise sessions.    Initial Home Exercises Provided  11/02/17       Nutrition:  Target Goals: Understanding of nutrition guidelines, daily intake of sodium 1500mg , cholesterol 200mg , calories 30% from fat and 7% or less from saturated fats, daily to have 5 or more servings of fruits and vegetables.  Biometrics: Pre Biometrics - 10/25/17 1414      Pre Biometrics   Height  5\' 8"  (1.727 m)    Weight  179 lb 3.2 oz (81.3 kg)    Waist Circumference  39 inches    Hip Circumference  37 inches    Waist to Hip Ratio  1.05 %    BMI (Calculated)  27.25    Triceps Skinfold  7 mm    % Body Fat  23.36 %    Grip Strength  79.73 kg    Flexibility  14.83 in    Single Leg Stand  60 seconds        Nutrition Therapy Plan and Nutrition Goals: Nutrition Therapy & Goals - 11/14/17 1439       Nutrition Therapy   RD appointment deferred  Yes      Personal Nutrition Goals   Personal Goal #2  Patient is eating a diabetic diet anlong with making heart healthy choices.        Nutrition Assessments: Nutrition Assessments - 10/25/17 1501      MEDFICTS Scores   Pre  Score  27       Nutrition Goals Re-Evaluation:   Nutrition Goals Discharge (Final Nutrition Goals Re-Evaluation):   Psychosocial: Target Goals: Acknowledge presence or absence of significant depression and/or stress, maximize coping skills, provide positive support system. Participant is able to verbalize types and ability to use techniques and skills needed for reducing stress and depression.  Initial Review & Psychosocial Screening: Initial Psych Review & Screening - 10/25/17 1506      Initial Review   Current issues with  None Identified    Source of Stress Concerns  None Identified      Family Dynamics   Good Support System?  Yes      Barriers   Psychosocial barriers to participate in program  There are no identifiable barriers or psychosocial needs.      Screening Interventions   Interventions  Encouraged to exercise    Expected Outcomes  Short Term goal: Identification and review with participant of any Quality of Life or Depression concerns found by scoring the questionnaire.;Long Term goal: The participant improves quality of Life and PHQ9 Scores as seen by post scores and/or verbalization of changes       Quality of Life Scores: Quality of Life - 10/25/17 1415      Quality of Life Scores   Health/Function Pre  20.4 %    Socioeconomic Pre  26.86 %    Psych/Spiritual Pre  23.79 %    Family Pre  28.8 %    GLOBAL Pre  23.66 %      Scores of 19 and below usually indicate a poorer quality of life in these areas.  A difference of  2-3 points is a clinically meaningful difference.  A difference of 2-3 points in the total score of the Quality of Life Index has been associated with significant  improvement in overall quality of life, self-image, physical symptoms, and general health in studies assessing change in quality of life.  PHQ-9: Recent Review Flowsheet Data    Depression screen [facility] 2/9 10/25/2017 04/26/2017   Decreased Interest 0 0   Down, Depressed, Hopeless 0 0   PHQ - 2 Score 0 0   Altered sleeping 1 -   Tired, decreased energy 1 -   Change in appetite 0 -   Feeling bad or failure about yourself  0 -   Trouble concentrating 0 -   Moving slowly or fidgety/restless 0 -   Suicidal thoughts 0 -   PHQ-9 Score 2 -   Difficult doing work/chores Not difficult at all -     Interpretation of Total Score  Total Score Depression Severity:  1-4 = Minimal depression, 5-9 = Mild depression, 10-14 = Moderate depression, 15-19 = Moderately severe depression, 20-27 = Severe depression   Psychosocial Evaluation and Intervention: Psychosocial Evaluation - 10/25/17 1507      Psychosocial Evaluation & Interventions   Interventions  Encouraged to exercise with the program and follow exercise prescription    Continue Psychosocial Services   No Follow up required       Psychosocial Re-Evaluation: Psychosocial Re-Evaluation    [name] Foster Name 11/14/17 1443             Psychosocial Re-Evaluation   Current issues with  None Identified       Comments  Patient's initial QOL score was 23.66 and her PHQ-9 was 2. No issues identified.        Expected Outcomes  Patient will have no psychosocial issues identified at discharge.  Interventions  Stress management education;Encouraged to attend Cardiac Rehabilitation for the exercise;Relaxation education       Continue Psychosocial Services   No Follow up required          Psychosocial Discharge (Final Psychosocial Re-Evaluation): Psychosocial Re-Evaluation - 11/14/17 1443      Psychosocial Re-Evaluation   Current issues with  None Identified    Comments  Patient's initial QOL score was 23.66 and her PHQ-9 was 2. No issues  identified.     Expected Outcomes  Patient will have no psychosocial issues identified at discharge.     Interventions  Stress management education;Encouraged to attend Cardiac Rehabilitation for the exercise;Relaxation education    Continue Psychosocial Services   No Follow up required       Vocational Rehabilitation: Provide vocational rehab assistance to qualifying candidates.   Vocational Rehab Evaluation & Intervention: Vocational Rehab - 10/25/17 1451      Initial Vocational Rehab Evaluation & Intervention   Assessment shows need for Vocational Rehabilitation  No       Education: Education Goals: Education classes will be provided on a weekly basis, covering required topics. Participant will state understanding/return demonstration of topics presented.  Learning Barriers/Preferences: Learning Barriers/Preferences - 10/25/17 1448      Learning Barriers/Preferences   Learning Barriers  None    Learning Preferences  Skilled Demonstration;Group Instruction;Verbal Instruction       Education Topics: Hypertension, Hypertension Reduction -Define heart disease and high blood pressure. Discus how high blood pressure affects the body and ways to reduce high blood pressure.   Exercise and Your Heart -Discuss why it is important to exercise, the FITT principles of exercise, normal and abnormal responses to exercise, and how to exercise safely.   Angina -Discuss definition of angina, causes of angina, treatment of angina, and how to decrease risk of having angina.   CARDIAC REHAB PHASE II EXERCISE from 11/09/2017 in [place]  Date  11/02/17  Educator  DJ  Instruction Review Code  2- Demonstrated Understanding      Cardiac Medications -Review what the following cardiac medications are used for, how they affect the body, and side effects that may occur when taking the medications.  Medications include Aspirin, Beta blockers, calcium channel blockers,  ACE Inhibitors, angiotensin receptor blockers, diuretics, digoxin, and antihyperlipidemics.   CARDIAC REHAB PHASE II EXERCISE from 11/09/2017 in [place]  Date  11/09/17  Educator  DC  Instruction Review Code  2- Demonstrated Understanding      Congestive Heart Failure -Discuss the definition of CHF, how to live with CHF, the signs and symptoms of CHF, and how keep track of weight and sodium intake.   Heart Disease and Intimacy -Discus the effect sexual activity has on the heart, how changes occur during intimacy as we age, and safety during sexual activity.   Smoking Cessation / COPD -Discuss different methods to quit smoking, the health benefits of quitting smoking, and the definition of COPD.   Nutrition I: Fats -Discuss the types of cholesterol, what cholesterol does to the heart, and how cholesterol levels can be controlled.   Nutrition II: Labels -Discuss the different components of food labels and how to read food label   Heart Parts/Heart Disease and PAD -Discuss the anatomy of the heart, the pathway of blood circulation through the heart, and these are affected by heart disease.   Stress I: Signs and Symptoms -Discuss the causes of stress, how stress may lead to anxiety and  depression, and ways to limit stress.   Stress II: Relaxation -Discuss different types of relaxation techniques to limit stress.   Warning Signs of Stroke / TIA -Discuss definition of a stroke, what the signs and symptoms are of a stroke, and how to identify when someone is having stroke.   Knowledge Questionnaire Score: Knowledge Questionnaire Score - 10/25/17 1449      Knowledge Questionnaire Score   Pre Score  21/24       Core Components/Risk Factors/Patient Goals at Admission: Personal Goals and Risk Factors at Admission - 10/25/17 1502      Core Components/Risk Factors/Patient Goals on Admission   Intervention  Weight Management: Provide education and  appropriate resources to help participant work on and attain dietary goals.    Admit Weight  179 lb 3.2 oz (81.3 kg)    Goal Weight: Short Term  174 lb 3.2 oz (79 kg)    Goal Weight: Long Term  169 lb 3.2 oz (76.7 kg)    Expected Outcomes  Short Term: Continue to assess and modify interventions until short term weight is achieved;Long Term: Adherence to nutrition and physical activity/exercise program aimed toward attainment of established weight goal    Diabetes  -- Has diabetes under control.    Personal Goal Other  Yes    Personal Goal  Regain heart health, lose 5-10 pounds.     Intervention  Attend CR 3 x week and supplement with home exercise 2 x week.     Expected Outcomes  Achieve personal goals.        Core Components/Risk Factors/Patient Goals Review:  Goals and Risk Factor Review    Row Name 11/14/17 1440             Core Components/Risk Factors/Patient Goals Review   Personal Goals Review  Weight Management/Obesity;Diabetes Lose 5-10 lbs; get heart healthy again.       Review  Patient has completed 8 sessions losing 1 lbs. He last A1C was 6 a few months ago. His reported glucose readings have improved since he started after he changed what he was eating for breakfast per our advice. He is progressing on the excercise equipment. Will continue to monitor for progress.        Expected Outcomes  Patient will continue to attend sessions and complete the program meeting his personal goals.           Core Components/Risk Factors/Patient Goals at Discharge (Final Review):  Goals and Risk Factor Review - 11/14/17 1440      Core Components/Risk Factors/Patient Goals Review   Personal Goals Review  Weight Management/Obesity;Diabetes Lose 5-10 lbs; get heart healthy again.    Review  Patient has completed 8 sessions losing 1 lbs. He last A1C was 6 a few months ago. His reported glucose readings have improved since he started after he changed what he was eating for breakfast per our  advice. He is progressing on the excercise equipment. Will continue to monitor for progress.     Expected Outcomes  Patient will continue to attend sessions and complete the program meeting his personal goals.        ITP Comments: ITP Comments    Row Name 10/25/17 1453 10/26/17 1318         ITP Comments  Mr. Goswami is a 62 year old patient with reocurring stent placement. 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 He did not come to CR at that time. He had another episode where  he needed another stent. Patient is now ready to attend CR to regain is strength and regain heart health.   Patient new to program. Plans to start Monday 10/31/17.         Comments: ITP 30 Day REVIEW Patient is doing well in the program. Will continue to monitor for progress.

## 2017-11-16 ENCOUNTER — Encounter (HOSPITAL_COMMUNITY)
Admission: RE | Admit: 2017-11-16 | Discharge: 2017-11-16 | Disposition: A | Discharge status: Home / Self Care | Payer: 59 | Source: Ambulatory Visit | Attending: Cardiovascular Disease | Admitting: Cardiovascular Disease

## 2017-11-16 DIAGNOSIS — Z955 Presence of coronary angioplasty implant and graft: Secondary | ICD-10-CM

## 2017-11-16 DIAGNOSIS — Z48812 Encounter for surgical aftercare following surgery on the circulatory system: Secondary | ICD-10-CM | POA: Diagnosis not present

## 2017-11-16 NOTE — Progress Notes (Signed)
Daily Session Note  Patient Details  Name: [name] Foster MRN: [id number] Date of Birth: [birth year] Referring Provider:     CARDIAC REHAB PHASE II ORIENTATION from 10/25/2017 in [place]  Referring Provider  Dr. Burt Knack      Encounter Date: 11/16/2017  Check In: Session Check In - 11/16/17 0954      Check-In   Location  [facility]    Staff Present  Diane Angelina Pih, MS, EP, [facility], Exercise Physiologist;Thurmon Mizell Luther Parody, BS, EP, Exercise Physiologist;Debra Wynetta Emery, RN, BSN    Supervising physician immediately available to respond to emergencies  See telemetry face sheet for immediately available MD    Medication changes reported      No    Fall or balance concerns reported     No    Tobacco Cessation  No Change    Warm-up and Cool-down  Performed on first and last piece of equipment    Resistance Training Performed  Yes    VAD Patient?  No      Pain Assessment   Currently in Pain?  No/denies    Pain Score  0-No pain    Multiple Pain Sites  No       Capillary Blood Glucose: No results found for this or any previous visit (from the past 24 hour(s)).    Social History   Tobacco Use  Smoking Status Former Smoker  . Packs/day: 1.00  . Last attempt to quit: 03/25/2017  . Years since quitting: 0.6  Smokeless Tobacco Never Used    Goals Met:  Independence with exercise equipment Exercise tolerated well No report of cardiac concerns or symptoms Strength training completed today  Goals Unmet:  Not Applicable  Comments: Check out 1030   Dr. Kate Sable is Medical Director for [place] and Pulmonary Rehab.

## 2017-11-18 ENCOUNTER — Encounter (HOSPITAL_COMMUNITY)
Admission: RE | Admit: 2017-11-18 | Discharge: 2017-11-18 | Disposition: A | Discharge status: Home / Self Care | Payer: 59 | Source: Ambulatory Visit | Attending: Cardiovascular Disease | Admitting: Cardiovascular Disease

## 2017-11-18 DIAGNOSIS — Z955 Presence of coronary angioplasty implant and graft: Secondary | ICD-10-CM

## 2017-11-18 DIAGNOSIS — Z48812 Encounter for surgical aftercare following surgery on the circulatory system: Secondary | ICD-10-CM | POA: Diagnosis not present

## 2017-11-18 NOTE — Progress Notes (Signed)
Daily Session Note  Patient Details  Name: [name] Foster MRN: [id number] Date of Birth: September 25, 1955 Referring Provider:     CARDIAC REHAB PHASE II ORIENTATION from 10/25/2017 in [place]  Referring Provider  Dr. Burt Knack      Encounter Date: 11/18/2017  Check In: Session Check In - 11/18/17 0930      Check-In   Location  [facility]    Staff Present  Diane Angelina Pih, MS, EP, [facility], Exercise Physiologist;Gregory Luther Parody, BS, EP, Exercise Physiologist;Gaetano Romberger Wynetta Emery, RN, BSN    Supervising physician immediately available to respond to emergencies  See telemetry face sheet for immediately available MD    Medication changes reported      No    Fall or balance concerns reported     No    Warm-up and Cool-down  Performed on first and last piece of equipment    Resistance Training Performed  Yes    VAD Patient?  No      Pain Assessment   Currently in Pain?  No/denies    Pain Score  0-No pain    Multiple Pain Sites  No       Capillary Blood Glucose: No results found for this or any previous visit (from the past 24 hour(s)).    Social History   Tobacco Use  Smoking Status Former Smoker  . Packs/day: 1.00  . Last attempt to quit: 03/25/2017  . Years since quitting: 0.6  Smokeless Tobacco Never Used    Goals Met:  Independence with exercise equipment Exercise tolerated well No report of cardiac concerns or symptoms Strength training completed today  Goals Unmet:  Not Applicable  Comments: Check out 1030.   Dr. Kate Sable is Medical Director for [facility] Cardiac and Pulmonary Rehab.

## 2017-11-21 ENCOUNTER — Encounter (HOSPITAL_COMMUNITY)
Admission: RE | Admit: 2017-11-21 | Discharge: 2017-11-21 | Disposition: A | Discharge status: Home / Self Care | Payer: 59 | Source: Ambulatory Visit | Attending: Cardiovascular Disease | Admitting: Cardiovascular Disease

## 2017-11-21 DIAGNOSIS — Z48812 Encounter for surgical aftercare following surgery on the circulatory system: Secondary | ICD-10-CM | POA: Diagnosis not present

## 2017-11-21 DIAGNOSIS — Z955 Presence of coronary angioplasty implant and graft: Secondary | ICD-10-CM

## 2017-11-21 NOTE — Progress Notes (Signed)
Daily Session Note  Patient Details  Name: [name] Foster MRN: [id number] Date of Birth: 01/01/1956 Referring Provider:     CARDIAC REHAB PHASE II ORIENTATION from 10/25/2017 in [place] CARDIAC REHABILITATION  Referring Provider  Dr. Cooper      Encounter Date: 11/21/2017  Check In: Session Check In - 11/21/17 0930      Check-In   Location  [facility]    Staff Present  Gregory Cowan, BS, EP, Exercise Physiologist;Debra Johnson, RN, BSN    Supervising physician immediately available to respond to emergencies  See telemetry face sheet for immediately available MD    Medication changes reported      No    Fall or balance concerns reported     No    Warm-up and Cool-down  Performed on first and last piece of equipment    Resistance Training Performed  Yes    VAD Patient?  No      Pain Assessment   Currently in Pain?  No/denies    Pain Score  0-No pain    Multiple Pain Sites  No       Capillary Blood Glucose: No results found for this or any previous visit (from the past 24 hour(s)).    Social History   Tobacco Use  Smoking Status Former Smoker  . Packs/day: 1.00  . Last attempt to quit: 03/25/2017  . Years since quitting: 0.6  Smokeless Tobacco Never Used    Goals Met:  Independence with exercise equipment Exercise tolerated well No report of cardiac concerns or symptoms Strength training completed today  Goals Unmet:  Not Applicable  Comments: Check out 1030.   Dr. Suresh Koneswaran is Medical Director for [place] Cardiac and Pulmonary Rehab. 

## 2017-11-23 ENCOUNTER — Encounter (HOSPITAL_COMMUNITY): Payer: 59

## 2017-11-25 ENCOUNTER — Encounter (HOSPITAL_COMMUNITY)
Admission: RE | Admit: 2017-11-25 | Discharge: 2017-11-25 | Disposition: A | Discharge status: Home / Self Care | Payer: 59 | Source: Ambulatory Visit | Attending: Cardiovascular Disease | Admitting: Cardiovascular Disease

## 2017-11-25 DIAGNOSIS — Z48812 Encounter for surgical aftercare following surgery on the circulatory system: Secondary | ICD-10-CM | POA: Diagnosis not present

## 2017-11-25 DIAGNOSIS — Z955 Presence of coronary angioplasty implant and graft: Secondary | ICD-10-CM

## 2017-11-25 NOTE — Progress Notes (Signed)
Daily Session Note  Patient Details  Name: GLENNIS MONTENEGRO MRN: [id number] Date of Birth: [birth year] Referring Provider:     CARDIAC REHAB PHASE II ORIENTATION from 10/25/2017 in [place]  Referring Provider  Dr. Burt Knack      Encounter Date: 11/25/2017  Check In: Session Check In - 11/25/17 0930      Check-In   Location  [facility]    Staff Present  Diane Angelina Pih, MS, EP, [facility], Exercise Physiologist;Gregory Luther Parody, BS, EP, Exercise Physiologist;Ladavia Lindenbaum Wynetta Emery, RN, BSN    Supervising physician immediately available to respond to emergencies  See telemetry face sheet for immediately available MD    Medication changes reported      No    Fall or balance concerns reported     No    Warm-up and Cool-down  Performed on first and last piece of equipment    Resistance Training Performed  Yes    VAD Patient?  No      Pain Assessment   Currently in Pain?  No/denies    Pain Score  0-No pain    Multiple Pain Sites  No       Capillary Blood Glucose: No results found for this or any previous visit (from the past 24 hour(s)).    Social History   Tobacco Use  Smoking Status Former Smoker  . Packs/day: 1.00  . Last attempt to quit: 03/25/2017  . Years since quitting: 0.6  Smokeless Tobacco Never Used    Goals Met:  Independence with exercise equipment Exercise tolerated well No report of cardiac concerns or symptoms Strength training completed today  Goals Unmet:  Not Applicable  Comments: Check out 1030.   Dr. Kate Sable is Medical Director for [facility] Cardiac and Pulmonary Rehab.

## 2017-11-28 ENCOUNTER — Encounter (HOSPITAL_COMMUNITY)
Admission: RE | Admit: 2017-11-28 | Discharge: 2017-11-28 | Disposition: A | Discharge status: Home / Self Care | Payer: 59 | Source: Ambulatory Visit | Attending: Cardiovascular Disease | Admitting: Cardiovascular Disease

## 2017-11-28 DIAGNOSIS — Z48812 Encounter for surgical aftercare following surgery on the circulatory system: Secondary | ICD-10-CM | POA: Diagnosis not present

## 2017-11-28 DIAGNOSIS — Z955 Presence of coronary angioplasty implant and graft: Secondary | ICD-10-CM

## 2017-11-28 NOTE — Progress Notes (Signed)
Daily Session Note  Patient Details  Name: [name] Foster MRN: [id number] Date of Birth: [birth year] Referring Provider:     CARDIAC REHAB PHASE II ORIENTATION from 10/25/2017 in [place]  Referring Provider  Dr. Burt Knack      Encounter Date: 11/28/2017  Check In: Session Check In - 11/28/17 0941      Check-In   Location  [facility]    Staff Present  Diane Angelina Pih, MS, EP, [facility], Exercise Physiologist;Nekeya Briski Luther Parody, BS, EP, Exercise Physiologist;Debra Wynetta Emery, RN, BSN    Supervising physician immediately available to respond to emergencies  See telemetry face sheet for immediately available MD    Medication changes reported      No    Fall or balance concerns reported     No    Tobacco Cessation  No Change    Warm-up and Cool-down  Performed on first and last piece of equipment    Resistance Training Performed  Yes    VAD Patient?  No      Pain Assessment   Currently in Pain?  No/denies    Pain Score  0-No pain    Multiple Pain Sites  No       Capillary Blood Glucose: No results found for this or any previous visit (from the past 24 hour(s)).    Social History   Tobacco Use  Smoking Status Former Smoker  . Packs/day: 1.00  . Last attempt to quit: 03/25/2017  . Years since quitting: 0.6  Smokeless Tobacco Never Used    Goals Met:  Independence with exercise equipment Exercise tolerated well No report of cardiac concerns or symptoms Strength training completed today  Goals Unmet:  Not Applicable  Comments: Check out 1030   Dr. Kate Sable is Medical Director for [place] and Pulmonary Rehab.

## 2017-11-30 ENCOUNTER — Encounter (HOSPITAL_COMMUNITY)
Admission: RE | Admit: 2017-11-30 | Discharge: 2017-11-30 | Disposition: A | Discharge status: Home / Self Care | Payer: 59 | Source: Ambulatory Visit | Attending: Cardiovascular Disease | Admitting: Cardiovascular Disease

## 2017-11-30 DIAGNOSIS — Z48812 Encounter for surgical aftercare following surgery on the circulatory system: Secondary | ICD-10-CM | POA: Diagnosis not present

## 2017-11-30 DIAGNOSIS — Z955 Presence of coronary angioplasty implant and graft: Secondary | ICD-10-CM

## 2017-11-30 NOTE — Progress Notes (Signed)
Daily Session Note  Patient Details  Name: [name] Foster MRN: [id number] Date of Birth: February 24, 1956 Referring Provider:     CARDIAC REHAB PHASE II ORIENTATION from 10/25/2017 in [place]  Referring Provider  Dr. Burt Knack      Encounter Date: 11/30/2017  Check In: Session Check In - 11/30/17 0930      Check-In   Location  [facility]    Staff Present  Diane Angelina Pih, MS, EP, [facility], Exercise Physiologist;Gregory Luther Parody, BS, EP, Exercise Physiologist;Olanna Percifield Wynetta Emery, RN, BSN    Supervising physician immediately available to respond to emergencies  See telemetry face sheet for immediately available MD    Medication changes reported      No    Fall or balance concerns reported     No    Warm-up and Cool-down  Performed on first and last piece of equipment    Resistance Training Performed  Yes    VAD Patient?  No      Pain Assessment   Currently in Pain?  No/denies    Pain Score  0-No pain    Multiple Pain Sites  No       Capillary Blood Glucose: No results found for this or any previous visit (from the past 24 hour(s)).    Social History   Tobacco Use  Smoking Status Former Smoker  . Packs/day: 1.00  . Last attempt to quit: 03/25/2017  . Years since quitting: 0.6  Smokeless Tobacco Never Used    Goals Met:  Independence with exercise equipment Exercise tolerated well No report of cardiac concerns or symptoms Strength training completed today  Goals Unmet:  Not Applicable  Comments: Check out 1030.   Dr. Kate Sable is Medical Director for [facility] Cardiac and Pulmonary Rehab.

## 2017-12-02 ENCOUNTER — Encounter (HOSPITAL_COMMUNITY)
Admission: RE | Admit: 2017-12-02 | Discharge: 2017-12-02 | Disposition: A | Discharge status: Home / Self Care | Payer: 59 | Source: Ambulatory Visit | Attending: Cardiovascular Disease | Admitting: Cardiovascular Disease

## 2017-12-02 DIAGNOSIS — Z48812 Encounter for surgical aftercare following surgery on the circulatory system: Secondary | ICD-10-CM | POA: Diagnosis not present

## 2017-12-02 NOTE — Progress Notes (Signed)
Daily Session Note  Patient Details  Name: [name] Foster MRN: [id number] Date of Birth: Apr 13, 1956 Referring Provider:     CARDIAC REHAB PHASE II ORIENTATION from 10/25/2017 in [place]  Referring Provider  Dr. Burt Knack      Encounter Date: 12/02/2017  Check In: Session Check In - 12/02/17 0930      Check-In   Location  [facility]    Staff Present  Silva Aamodt Angelina Pih, MS, EP, [facility], Exercise Physiologist;Debra Wynetta Emery, RN, BSN    Supervising physician immediately available to respond to emergencies  See telemetry face sheet for immediately available MD    Medication changes reported      No    Fall or balance concerns reported     No    Tobacco Cessation  No Change    Warm-up and Cool-down  Performed as group-led instruction    Resistance Training Performed  Yes    VAD Patient?  No      Pain Assessment   Currently in Pain?  No/denies    Pain Score  0-No pain    Multiple Pain Sites  No       Capillary Blood Glucose: No results found for this or any previous visit (from the past 24 hour(s)).    Social History   Tobacco Use  Smoking Status Former Smoker  . Packs/day: 1.00  . Last attempt to quit: 03/25/2017  . Years since quitting: 0.6  Smokeless Tobacco Never Used    Goals Met:  Independence with exercise equipment Exercise tolerated well No report of cardiac concerns or symptoms Strength training completed today  Goals Unmet:  Not Applicable  Comments: Check out: 1030   Dr. Kate Sable is Medical Director for [place] and Pulmonary Rehab.

## 2017-12-05 ENCOUNTER — Telehealth: Payer: Self-pay | Admitting: Cardiovascular Disease

## 2017-12-05 ENCOUNTER — Encounter (HOSPITAL_COMMUNITY)
Admission: RE | Admit: 2017-12-05 | Discharge: 2017-12-05 | Disposition: A | Discharge status: Home / Self Care | Payer: 59 | Source: Ambulatory Visit | Attending: Cardiovascular Disease | Admitting: Cardiovascular Disease

## 2017-12-05 DIAGNOSIS — Z48812 Encounter for surgical aftercare following surgery on the circulatory system: Secondary | ICD-10-CM | POA: Diagnosis not present

## 2017-12-05 NOTE — Progress Notes (Signed)
Daily Session Note  Patient Details  Name: RAEL YO MRN: [id number] Date of Birth: 1955/11/25 Referring Provider:     CARDIAC REHAB PHASE II ORIENTATION from 10/25/2017 in [place]  Referring Provider  Dr. Burt Knack      Encounter Date: 12/05/2017  Check In: Session Check In - 12/05/17 0930      Check-In   Location  [facility]    Staff Present  [place] Kuang Angelina Pih, MS, EP, [facility], Exercise Physiologist;Debra Wynetta Emery, RN, BSN    Supervising physician immediately available to respond to emergencies  See telemetry face sheet for immediately available MD    Medication changes reported      No    Fall or balance concerns reported     No    Warm-up and Cool-down  Performed as group-led instruction    Resistance Training Performed  Yes    VAD Patient?  No      Pain Assessment   Currently in Pain?  No/denies    Pain Score  0-No pain    Multiple Pain Sites  No       Capillary Blood Glucose: No results found for this or any previous visit (from the past 24 hour(s)).    Social History   Tobacco Use  Smoking Status Former Smoker  . Packs/day: 1.00  . Last attempt to quit: 03/25/2017  . Years since quitting: 0.6  Smokeless Tobacco Never Used    Goals Met:  Independence with exercise equipment Exercise tolerated well No report of cardiac concerns or symptoms Strength training completed today  Goals Unmet:  Not Applicable  Comments: Check out: 10:30   Dr. Kate Sable is Medical Director for [place] and Pulmonary Rehab.

## 2017-12-05 NOTE — Telephone Encounter (Signed)
New Message   Pt c/o BP issue:  1. What are your last 5 BP readings? 141/77, 170/73, 160/86, 144/78, 148/72 2. Are you having any other symptoms (ex. Dizziness, headache, blurred vision, passed out)? No 3. What is your medication issue? Patient states that he had is medication modified back in Feb and was advised to call within a month just to provide readings.

## 2017-12-06 NOTE — Telephone Encounter (Signed)
Would increase metoprolol to 50 mg BID and continue to monitor BP. thx

## 2017-12-07 ENCOUNTER — Encounter (HOSPITAL_COMMUNITY)
Admission: RE | Admit: 2017-12-07 | Discharge: 2017-12-07 | Disposition: A | Discharge status: Home / Self Care | Payer: 59 | Source: Ambulatory Visit | Attending: Cardiovascular Disease | Admitting: Cardiovascular Disease

## 2017-12-07 DIAGNOSIS — Z955 Presence of coronary angioplasty implant and graft: Secondary | ICD-10-CM

## 2017-12-07 DIAGNOSIS — Z48812 Encounter for surgical aftercare following surgery on the circulatory system: Secondary | ICD-10-CM | POA: Diagnosis not present

## 2017-12-07 MED ORDER — METOPROLOL TARTRATE 50 MG PO TABS: 50.0000 mg | ORAL_TABLET | Freq: Two times a day (BID) | ORAL | 3 refills | Status: DC

## 2017-12-07 NOTE — Telephone Encounter (Signed)
Instructed patient to INCREASE METOPROLOL to 50 mg BID. He will continue to monitor BP and call if it remains elevated. He was grateful for call and agrees with treatment plan.

## 2017-12-07 NOTE — Progress Notes (Signed)
Daily Session Note  Patient Details  Name: [name] Foster MRN: [id number] Date of Birth: 12/06/1955 Referring Provider:     CARDIAC REHAB PHASE II ORIENTATION from 10/25/2017 in [place]  Referring Provider  Dr. Burt Knack      Encounter Date: 12/07/2017  Check In: Session Check In - 12/07/17 0942      Check-In   Location  [facility]    Staff Present  Russella Dar, MS, EP, [facility], Exercise Physiologist;Debra Wynetta Emery, RN, BSN;Petra Sargeant, BS, EP, Exercise Physiologist    Supervising physician immediately available to respond to emergencies  See telemetry face sheet for immediately available MD    Medication changes reported      No    Fall or balance concerns reported     No    Tobacco Cessation  No Change    Warm-up and Cool-down  Performed as group-led instruction    Resistance Training Performed  Yes    VAD Patient?  No      Pain Assessment   Currently in Pain?  No/denies    Pain Score  0-No pain    Multiple Pain Sites  No       Capillary Blood Glucose: No results found for this or any previous visit (from the past 24 hour(s)).    Social History   Tobacco Use  Smoking Status Former Smoker  . Packs/day: 1.00  . Last attempt to quit: 03/25/2017  . Years since quitting: 0.7  Smokeless Tobacco Never Used    Goals Met:  Independence with exercise equipment Exercise tolerated well No report of cardiac concerns or symptoms Strength training completed today  Goals Unmet:  Not Applicable  Comments: Check out 1030   Dr. Kate Sable is Medical Director for [place] and Pulmonary Rehab.

## 2017-12-09 ENCOUNTER — Encounter (HOSPITAL_COMMUNITY)
Admission: RE | Admit: 2017-12-09 | Discharge: 2017-12-09 | Disposition: A | Discharge status: Home / Self Care | Payer: 59 | Source: Ambulatory Visit | Attending: Cardiovascular Disease | Admitting: Cardiovascular Disease

## 2017-12-09 DIAGNOSIS — Z955 Presence of coronary angioplasty implant and graft: Secondary | ICD-10-CM

## 2017-12-09 DIAGNOSIS — Z48812 Encounter for surgical aftercare following surgery on the circulatory system: Secondary | ICD-10-CM | POA: Diagnosis not present

## 2017-12-09 NOTE — Progress Notes (Signed)
Daily Session Note  Patient Details  Name: [name] Foster MRN: [id number] Date of Birth: 1955-11-22 Referring Provider:     CARDIAC REHAB PHASE II ORIENTATION from 10/25/2017 in [place]  Referring Provider  Dr. Burt Knack      Encounter Date: 12/09/2017  Check In: Session Check In - 12/09/17 0955      Check-In   Location  [facility]    Staff Present  Russella Dar, MS, EP, [facility], Exercise Physiologist;Debra Wynetta Emery, RN, BSN;Tranice Laduke, BS, EP, Exercise Physiologist    Supervising physician immediately available to respond to emergencies  See telemetry face sheet for immediately available MD    Medication changes reported      No    Fall or balance concerns reported     No    Tobacco Cessation  No Change    Warm-up and Cool-down  Performed as group-led instruction    Resistance Training Performed  Yes    VAD Patient?  No      Pain Assessment   Currently in Pain?  No/denies    Pain Score  0-No pain    Multiple Pain Sites  No       Capillary Blood Glucose: No results found for this or any previous visit (from the past 24 hour(s)).    Social History   Tobacco Use  Smoking Status Former Smoker  . Packs/day: 1.00  . Last attempt to quit: 03/25/2017  . Years since quitting: 0.7  Smokeless Tobacco Never Used    Goals Met:  Independence with exercise equipment Exercise tolerated well No report of cardiac concerns or symptoms Strength training completed today  Goals Unmet:  Not Applicable  Comments: Check out 1030   Dr. Kate Sable is Medical Director for [place] and Pulmonary Rehab.

## 2017-12-12 ENCOUNTER — Encounter (HOSPITAL_COMMUNITY)
Admission: RE | Admit: 2017-12-12 | Discharge: 2017-12-12 | Disposition: A | Discharge status: Home / Self Care | Payer: 59 | Source: Ambulatory Visit | Attending: Cardiovascular Disease | Admitting: Cardiovascular Disease

## 2017-12-12 DIAGNOSIS — Z48812 Encounter for surgical aftercare following surgery on the circulatory system: Secondary | ICD-10-CM | POA: Insufficient documentation

## 2017-12-12 DIAGNOSIS — Z955 Presence of coronary angioplasty implant and graft: Secondary | ICD-10-CM | POA: Diagnosis present

## 2017-12-12 NOTE — Progress Notes (Signed)
Daily Session Note  Patient Details  Name: [name] Foster MRN: [id number] Date of Birth: 08-25-1956 Referring Provider:     CARDIAC REHAB PHASE II ORIENTATION from 10/25/2017 in [place]  Referring Provider  Dr. Burt Knack      Encounter Date: 12/12/2017  Check In: Session Check In - 12/12/17 0954      Check-In   Location  [facility]    Staff Present  Russella Dar, MS, EP, [facility], Exercise Physiologist;Debra Wynetta Emery, RN, BSN;Memorie Yokoyama, BS, EP, Exercise Physiologist    Supervising physician immediately available to respond to emergencies  See telemetry face sheet for immediately available MD    Medication changes reported      No    Fall or balance concerns reported     No    Tobacco Cessation  No Change    Warm-up and Cool-down  Performed as group-led instruction    Resistance Training Performed  Yes    VAD Patient?  No      Pain Assessment   Currently in Pain?  No/denies    Pain Score  0-No pain    Multiple Pain Sites  No       Capillary Blood Glucose: No results found for this or any previous visit (from the past 24 hour(s)).    Social History   Tobacco Use  Smoking Status Former Smoker  . Packs/day: 1.00  . Last attempt to quit: 03/25/2017  . Years since quitting: 0.7  Smokeless Tobacco Never Used    Goals Met:  Independence with exercise equipment Exercise tolerated well No report of cardiac concerns or symptoms Strength training completed today  Goals Unmet:  Not Applicable  Comments: Check out 1030   Dr. Kate Sable is Medical Director for [place] and Pulmonary Rehab.

## 2017-12-12 NOTE — Progress Notes (Signed)
Cardiac Individual Treatment Plan  Patient Details  Name: [name] Foster MRN: [id number] Date of Birth: 1956-05-07 Referring Provider:     CARDIAC REHAB PHASE II ORIENTATION from 10/25/2017 in [place]  Referring Provider  Dr. Burt Knack      Initial Encounter Date:    CARDIAC REHAB PHASE II ORIENTATION from 10/25/2017 in [place]  Date  10/25/17  Referring Provider  Dr. Burt Knack      Visit Diagnosis: Status post coronary artery stent placement  Patient's Home Medications on Admission:  Current Outpatient Medications:  .  aspirin EC 81 MG EC tablet, Take 1 tablet (81 mg total) by mouth daily., Disp: 30 tablet, Rfl: 11 .  atorvastatin (LIPITOR) 80 MG tablet, Take 1 tablet (80 mg total) by mouth daily at 6 PM., Disp: 30 tablet, Rfl: 11 .  gabapentin (NEURONTIN) 300 MG capsule, Take 300 mg by mouth 3 (three) times daily as needed (NERVE PAIN). , Disp: , Rfl:  .  HYDROcodone-acetaminophen (NORCO) 7.5-325 MG tablet, Take 1 tablet by mouth every 6 (six) hours as needed for moderate pain., Disp: , Rfl:  .  isosorbide mononitrate (IMDUR) 30 MG 24 hr tablet, Take 0.5 tablets (15 mg total) by mouth daily., Disp: 15 tablet, Rfl: 11 .  losartan (COZAAR) 50 MG tablet, Take 1 tablet (50 mg total) by mouth daily., Disp: 30 tablet, Rfl: 11 .  metFORMIN (GLUCOPHAGE) 1000 MG tablet, Take 500 mg by mouth 2 (two) times daily with a meal., Disp: , Rfl: 6 .  metoprolol tartrate (LOPRESSOR) 50 MG tablet, Take 1 tablet (50 mg total) by mouth 2 (two) times daily., Disp: 180 tablet, Rfl: 3 .  nitroGLYCERIN (NITROSTAT) 0.4 MG SL tablet, Place 1 tablet (0.4 mg total) under the tongue every 5 (five) minutes x 3 doses as needed for chest pain., Disp: 25 tablet, Rfl: 11 .  ticagrelor (BRILINTA) 90 MG TABS tablet, Take 1 tablet (90 mg total) by mouth 2 (two) times daily., Disp: 60 tablet, Rfl: 0 .  zolpidem (AMBIEN) 10 MG tablet, Take 10 mg by mouth at bedtime as needed  for sleep. , Disp: , Rfl:   Past Medical History: Past Medical History:  Diagnosis Date  . CAD in native artery    a. 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 DES to left PDA 09/20/17  . Diabetes mellitus without complication ([place])   . High cholesterol   . Hyperlipidemia   . Hypertension   . Insomnia   . Pulmonary hypertension ([place])    a. TTE 03/25/17; EF 60-65%, nl WM, LV diastolic fxn nl, trivial AI, RV cavity size, wall thickness, and sys fxn nl, trivial TR, PASP 39 mmHg, trivial pericardial effusion  . Sleep apnea    could not use a cpap  . Wears dentures    full top-partial bottom  . Wears glasses     Tobacco Use: Social History   Tobacco Use  Smoking Status Former Smoker  . Packs/day: 1.00  . Last attempt to quit: 03/25/2017  . Years since quitting: 0.7  Smokeless Tobacco Never Used    Labs: Recent Review Scientist, physiological    Labs for ITP Cardiac and Pulmonary Rehab Latest Ref Rng & Units 03/25/2017 05/13/2017   Cholestrol 100 - 199 mg/dL 163 81(L)   LDLCALC 0 - 99 mg/dL 92 41   HDL >  39 mg/dL 25(L) 24(L)   Trlycerides 0 - 149 mg/dL 229(H) 82   Hemoglobin A1c 4.8 - 5.6 % 9.4(H) -   PHART 7.350 - 7.450 7.206(L) -   PCO2ART 32.0 - 48.0 mmHg 53.8(H) -   HCO3 20.0 - 28.0 mmol/L 21.3 -   TCO2 0 - 100 mmol/L 23 -   ACIDBASEDEF 0.0 - 2.0 mmol/L 7.0(H) -   O2SAT % 99.0 -      Capillary Blood Glucose: Lab Results  Component Value Date   GLUCAP 92 09/20/2017   GLUCAP 128 (H) 09/20/2017   GLUCAP 115 (H) 03/27/2017   GLUCAP 105 (H) 03/27/2017   GLUCAP 106 (H) 03/27/2017     Exercise Target Goals:    Exercise Program Goal: Individual exercise prescription set using results from initial 6 min walk test and THRR while considering  patient's activity barriers and safety.   Exercise Prescription Goal: Starting with aerobic activity 30 plus minutes  a day, 3 days per week for initial exercise prescription. Provide home exercise prescription and guidelines that participant acknowledges understanding prior to discharge.  Activity Barriers & Risk Stratification: Activity Barriers & Cardiac Risk Stratification - 10/25/17 1413      Activity Barriers & Cardiac Risk Stratification   Activity Barriers  None    Cardiac Risk Stratification  High       6 Minute Walk: 6 Minute Walk    Row Name 10/25/17 1412         6 Minute Walk   Phase  Initial     Distance  1550 feet     Distance % Change  0 %     Distance Feet Change  0 ft     Walk Time  6 minutes     # of Rest Breaks  0     MPH  2.93     METS  3.24     RPE  12     Perceived Dyspnea   13     VO2 Peak  14.79     Symptoms  No     Resting HR  60 bpm     Resting BP  172/86     Resting Oxygen Saturation   97 %     Exercise Oxygen Saturation  during 6 min walk  97 %     Max Ex. HR  90 bpm     Max Ex. BP  208/90     2 Minute Post BP  180/84        Oxygen Initial Assessment:   Oxygen Re-Evaluation:   Oxygen Discharge (Final Oxygen Re-Evaluation):   Initial Exercise Prescription: Initial Exercise Prescription - 10/25/17 1400      Date of Initial Exercise RX and Referring Provider   Date  10/25/17    Referring Provider  Dr. Burt Knack      Treadmill   MPH  2    Grade  0    Minutes  15    METs  2.5      Recumbant Elliptical   Level  1    RPM  45    Watts  58    Minutes  20    METs  3.2      Prescription Details   Frequency (times per week)  3    Duration  Progress to 30 minutes of continuous aerobic without signs/symptoms of physical distress      Intensity   THRR 40-80% of Max Heartrate  [phone]    Ratings  of Perceived Exertion  11-13    Perceived Dyspnea  0-4      Progression   Progression  Continue progressive overload as per policy without signs/symptoms or physical distress.      Resistance Training   Training Prescription  Yes    Weight  1     Reps  10-15       Perform Capillary Blood Glucose checks as needed.  Exercise Prescription Changes:  Exercise Prescription Changes    Row Name 11/02/17 0900 11/10/17 1500 11/29/17 0800 12/07/17 1400       Response to Exercise   Blood Pressure (Admit)  -  170/100  150/72  148/70    Blood Pressure (Exercise)  -  172/70  158/80  170/72    Blood Pressure (Exit)  -  150/66  144/72  140/74    Heart Rate (Admit)  -  74 bpm  54 bpm  64 bpm    Heart Rate (Exercise)  -  104 bpm  70 bpm  96 bpm    Heart Rate (Exit)  -  77 bpm  63 bpm  72 bpm    Rating of Perceived Exertion (Exercise)  -  _0 Duration  -  Progress to 30 minutes of  aerobic without signs/symptoms of physical distress  Progress to 30 minutes of  aerobic without signs/symptoms of physical distress  Progress to 30 minutes of  aerobic without signs/symptoms of physical distress    Intensity  -  THRR New [phone]  THRR New [phone]  THRR New [phone]      Progression   Progression  -  Continue to progress workloads to maintain intensity without signs/symptoms of physical distress.  Continue to progress workloads to maintain intensity without signs/symptoms of physical distress.  Continue to progress workloads to maintain intensity without signs/symptoms of physical distress.      Resistance Training   Training Prescription  Yes  Yes  Yes  Yes    Weight  _1 Reps  10-15  10-15  10-15  10-15      Treadmill   MPH  2  2  2.2  2.3    Grade  0  0  0  0    Minutes  _2 METs  2.5  2.5  2.6  2.7      Recumbant Elliptical   Level  _3 RPM  45  62  74  73    Watts  58  80  105  112    Minutes  _4 METs  3.2  4.5  5.7  6.2      Home Exercise Plan   Plans to continue exercise at  Home (comment)  Home (comment)  Home (comment)  Home (comment)    Frequency  Add 2 additional days to program exercise sessions.  Add 2 additional days to program exercise sessions.  Add 2  additional days to program exercise sessions.  Add 2 additional days to program exercise sessions.    Initial Home Exercises Provided  11/02/17  11/02/17  11/02/17  11/02/17       Exercise Comments:  Exercise Comments    Row Name 11/02/17 [phone] 11/10/17 1532 11/29/17 0821 12/07/17 1444     Exercise Comments  Patient  received the take home exercise plan today 11/02/2017. THR was addressed as were safety guidelines for being active when not in CR. Patient demonstrated an understanding and was encouraged to ask any future questions as they arise.   Patient is doing very well in CR and has maintained his speed on the treadmill and has increased his watts and RPMs on the recumbent elliptical. Patient has only recently started the program and has not been progressed much due to his abnormally high BP. His physicians have been contacted and he will be progressed in time throughout the remainder of the prorgam.   Patient is doing well in Cr and has progressed on both the treadmill as well as the recumbent elliptical. patients blood pressure has also decreased. Patient will continue to grow stronger and will be monitored throughout the remainder of the program. Goals will be met.   Patient is doing very well in CR and has maintained his speed on the treadmill and has increased his watts and RPMs on the recumbent elliptical. Patient has stated to me that he has much more energy than before and is able to do much more when not here in CR and his stamina has also increased. His BP continues to be monitored.       Exercise Goals and Review:  Exercise Goals    Row Name 10/25/17 1413             Exercise Goals   Increase Physical Activity  Yes       Intervention  Provide advice, education, support and counseling about physical activity/exercise needs.;Develop an individualized exercise prescription for aerobic and resistive training based on initial evaluation findings, risk stratification, comorbidities and  participant's personal goals.       Expected Outcomes  Short Term: Attend rehab on a regular basis to increase amount of physical activity.       Increase Strength and Stamina  Yes       Intervention  Provide advice, education, support and counseling about physical activity/exercise needs.;Develop an individualized exercise prescription for aerobic and resistive training based on initial evaluation findings, risk stratification, comorbidities and participant's personal goals.       Expected Outcomes  Short Term: Increase workloads from initial exercise prescription for resistance, speed, and METs.       Able to understand and use rate of perceived exertion (RPE) scale  Yes       Intervention  Provide education and explanation on how to use RPE scale       Expected Outcomes  Short Term: Able to use RPE daily in rehab to express subjective intensity level;Long Term:  Able to use RPE to guide intensity level when exercising independently       Able to understand and use Dyspnea scale  Yes       Intervention  Provide education and explanation on how to use Dyspnea scale       Expected Outcomes  Long Term: Able to use Dyspnea scale to guide intensity level when exercising independently;Short Term: Able to use Dyspnea scale daily in rehab to express subjective sense of shortness of breath during exertion       Knowledge and understanding of Target Heart Rate Range (THRR)  Yes       Intervention  Provide education and explanation of THRR including how the numbers were predicted and where they are located for reference       Expected Outcomes  Short Term: Able to state/look up THRR;Short Term:  Able to use daily as guideline for intensity in rehab;Long Term: Able to use THRR to govern intensity when exercising independently       Able to check pulse independently  Yes       Intervention  Review the importance of being able to check your own pulse for safety during independent exercise;Provide education and  demonstration on how to check pulse in carotid and radial arteries.       Expected Outcomes  Short Term: Able to explain why pulse checking is important during independent exercise;Long Term: Able to check pulse independently and accurately       Understanding of Exercise Prescription  Yes       Intervention  Provide education, explanation, and written materials on patient's individual exercise prescription       Expected Outcomes  Long Term: Able to explain home exercise prescription to exercise independently;Short Term: Able to explain program exercise prescription          Exercise Goals Re-Evaluation : Exercise Goals Re-Evaluation    [place] Name 11/10/17 1530 12/07/17 1443           Exercise Goal Re-Evaluation   Exercise Goals Review  Increase Physical Activity;Able to understand and use rate of perceived exertion (RPE) scale;Knowledge and understanding of Target Heart Rate Range (THRR);Able to understand and use Dyspnea scale;Understanding of Exercise Prescription  Increase Physical Activity;Able to understand and use rate of perceived exertion (RPE) scale;Knowledge and understanding of Target Heart Rate Range (THRR);Able to understand and use Dyspnea scale;Understanding of Exercise Prescription      Comments  Patient is doing very well in CR and has maintained his speed on the treadmill and has increased his watts and RPMs on the recumbent elliptical. Patient has only recently started the program and has not been progressed much due to his abnormally high BP. His physicians have been contacted and he will be progressed in time throughout the remainder of the prorgam.   Patient is doing very well in CR and has maintained his speed on the treadmill and has increased his watts and RPMs on the recumbent elliptical. Patient has stated to me that he has much more energy than before and is able to do much more when not here in CR and his stamina has also increased. His BP continues to be monitored.        Expected Outcomes  Patient wishes to lose weight and to become heart healthy again.   Patient wishes to lose weight and to become heart healthy again.           Discharge Exercise Prescription (Final Exercise Prescription Changes): Exercise Prescription Changes - 12/07/17 1400      Response to Exercise   Blood Pressure (Admit)  148/70    Blood Pressure (Exercise)  170/72    Blood Pressure (Exit)  140/74    Heart Rate (Admit)  64 bpm    Heart Rate (Exercise)  96 bpm    Heart Rate (Exit)  72 bpm    Rating of Perceived Exertion (Exercise)  9    Duration  Progress to 30 minutes of  aerobic without signs/symptoms of physical distress    Intensity  THRR New 102-120-139      Progression   Progression  Continue to progress workloads to maintain intensity without signs/symptoms of physical distress.      Resistance Training   Training Prescription  Yes    Weight  4    Reps  10-15  Treadmill   MPH  2.3    Grade  0    Minutes  15    METs  2.7      Recumbant Elliptical   Level  2    RPM  73    Watts  112    Minutes  20    METs  6.2      Home Exercise Plan   Plans to continue exercise at  Home (comment)    Frequency  Add 2 additional days to program exercise sessions.    Initial Home Exercises Provided  11/02/17       Nutrition:  Target Goals: Understanding of nutrition guidelines, daily intake of sodium <1533m, cholesterol <2027m calories 30% from fat and 7% or less from saturated fats, daily to have 5 or more servings of fruits and vegetables.  Biometrics: Pre Biometrics - 10/25/17 1414      Pre Biometrics   Height  _0  (1.727 m)    Weight  179 lb 3.2 oz (81.3 kg)    Waist Circumference  39 inches    Hip Circumference  37 inches    Waist to Hip Ratio  1.05 %    BMI (Calculated)  27.25    Triceps Skinfold  7 mm    % Body Fat  23.36 %    Grip Strength  79.73 kg    Flexibility  14.83 in    Single Leg Stand  60 seconds        Nutrition Therapy Plan and  Nutrition Goals: Nutrition Therapy & Goals - 11/17/17 1413      Personal Nutrition Goals   Nutrition Goal  For heart healthy choices add >50% of whole grains, make half their plate fruits and vegetables. Discuss the difference between starchy vegetables and leafy greens, and how leafy vegetables provide fiber, helps maintain healthy weight, helps control blood glucose, and lowers cholesterol.  Discuss purchasing fresh or frozen vegetable to reduce sodium and not to add grease, fat or sugar. Consume <18oz of red meat per week. Consume lean cuts of meats and very little of meats high in sodium and nitrates such as pork and lunch meats. Discussed portion control for all food groups.        Intervention Plan   Intervention  Nutrition handout(s) given to patient.    Expected Outcomes  Short Term Goal: Understand basic principles of dietary content, such as calories, fat, sodium, cholesterol and nutrients.       Nutrition Assessments: Nutrition Assessments - 10/25/17 1501      MEDFICTS Scores   Pre Score  27       Nutrition Goals Re-Evaluation: Nutrition Goals Re-Evaluation    Row Name 12/12/17 1444             Goals   Current Weight  179 lb (81.2 kg)       Nutrition Goal  For heart healthy choices add >50% of whole grains, make half their plate fruits and vegetables. Discuss the difference between starchy vegetables and leafy greens, and how leafy vegetables provide fiber, helps maintain healthy weight, helps control blood glucose, and lowers cholesterol.  Discuss purchasing fresh or frozen vegetable to reduce sodium and not to add grease, fat or sugar. Consume <18oz of red meat per week. Consume lean cuts of meats and very little of meats high in sodium and nitrates such as pork and lunch meats. Discussed portion control for all food groups.         Comment  Patient has maintained his weight. He continues to eat a diabetic diet and trys to make healthy choices.          Personal Goal #2  Re-Evaluation   Personal Goal #2  Patient is eating a diabetic diet anlong with making heart healthy choices.           Nutrition Goals Discharge (Final Nutrition Goals Re-Evaluation): Nutrition Goals Re-Evaluation - 12/12/17 1444      Goals   Current Weight  179 lb (81.2 kg)    Nutrition Goal  For heart healthy choices add >50% of whole grains, make half their plate fruits and vegetables. Discuss the difference between starchy vegetables and leafy greens, and how leafy vegetables provide fiber, helps maintain healthy weight, helps control blood glucose, and lowers cholesterol.  Discuss purchasing fresh or frozen vegetable to reduce sodium and not to add grease, fat or sugar. Consume <18oz of red meat per week. Consume lean cuts of meats and very little of meats high in sodium and nitrates such as pork and lunch meats. Discussed portion control for all food groups.      Comment  Patient has maintained his weight. He continues to eat a diabetic diet and trys to make healthy choices.       Personal Goal #2 Re-Evaluation   Personal Goal #2  Patient is eating a diabetic diet anlong with making heart healthy choices.        Psychosocial: Target Goals: Acknowledge presence or absence of significant depression and/or stress, maximize coping skills, provide positive support system. Participant is able to verbalize types and ability to use techniques and skills needed for reducing stress and depression.  Initial Review & Psychosocial Screening: Initial Psych Review & Screening - 10/25/17 1506      Initial Review   Current issues with  None Identified    Source of Stress Concerns  None Identified      Family Dynamics   Good Support System?  Yes      Barriers   Psychosocial barriers to participate in program  There are no identifiable barriers or psychosocial needs.      Screening Interventions   Interventions  Encouraged to exercise    Expected Outcomes  Short Term goal: Identification and  review with participant of any Quality of Life or Depression concerns found by scoring the questionnaire.;Long Term goal: The participant improves quality of Life and PHQ9 Scores as seen by post scores and/or verbalization of changes       Quality of Life Scores: Quality of Life - 10/25/17 1415      Quality of Life Scores   Health/Function Pre  20.4 %    Socioeconomic Pre  26.86 %    Psych/Spiritual Pre  23.79 %    Family Pre  28.8 %    GLOBAL Pre  23.66 %      Scores of 19 and below usually indicate a poorer quality of life in these areas.  A difference of  2-3 points is a clinically meaningful difference.  A difference of 2-3 points in the total score of the Quality of Life Index has been associated with significant improvement in overall quality of life, self-image, physical symptoms, and general health in studies assessing change in quality of life.  PHQ-9: Recent Review Flowsheet Data    Depression screen [facility] 2/9 10/25/2017 04/26/2017   Decreased Interest 0 0   Down, Depressed, Hopeless 0 0   PHQ - 2 Score 0 0  Altered sleeping 1 -   Tired, decreased energy 1 -   Change in appetite 0 -   Feeling bad or failure about yourself  0 -   Trouble concentrating 0 -   Moving slowly or fidgety/restless 0 -   Suicidal thoughts 0 -   PHQ-9 Score 2 -   Difficult doing work/chores Not difficult at all -     Interpretation of Total Score  Total Score Depression Severity:  1-4 = Minimal depression, 5-9 = Mild depression, 10-14 = Moderate depression, 15-19 = Moderately severe depression, 20-27 = Severe depression   Psychosocial Evaluation and Intervention: Psychosocial Evaluation - 10/25/17 1507      Psychosocial Evaluation & Interventions   Interventions  Encouraged to exercise with the program and follow exercise prescription    Continue Psychosocial Services   No Follow up required       Psychosocial Re-Evaluation: Psychosocial Re-Evaluation    [place] Name 11/14/17 1443 12/12/17  1455           Psychosocial Re-Evaluation   Current issues with  None Identified  None Identified      Comments  Patient's initial QOL score was 23.66 and her PHQ-9 was 2. No issues identified.   Patient's initial QOL score was 23.66 and her PHQ-9 was 2. No issues identified.       Expected Outcomes  Patient will have no psychosocial issues identified at discharge.   Patient will have no psychosocial issues identified at discharge.       Interventions  Stress management education;Encouraged to attend Cardiac Rehabilitation for the exercise;Relaxation education  Stress management education;Encouraged to attend Cardiac Rehabilitation for the exercise;Relaxation education      Continue Psychosocial Services   No Follow up required  No Follow up required         Psychosocial Discharge (Final Psychosocial Re-Evaluation): Psychosocial Re-Evaluation - 12/12/17 1455      Psychosocial Re-Evaluation   Current issues with  None Identified    Comments  Patient's initial QOL score was 23.66 and her PHQ-9 was 2. No issues identified.     Expected Outcomes  Patient will have no psychosocial issues identified at discharge.     Interventions  Stress management education;Encouraged to attend Cardiac Rehabilitation for the exercise;Relaxation education    Continue Psychosocial Services   No Follow up required       Vocational Rehabilitation: Provide vocational rehab assistance to qualifying candidates.   Vocational Rehab Evaluation & Intervention: Vocational Rehab - 10/25/17 1451      Initial Vocational Rehab Evaluation & Intervention   Assessment shows need for Vocational Rehabilitation  No       Education: Education Goals: Education classes will be provided on a weekly basis, covering required topics. Participant will state understanding/return demonstration of topics presented.  Learning Barriers/Preferences: Learning Barriers/Preferences - 10/25/17 1448      Learning Barriers/Preferences    Learning Barriers  None    Learning Preferences  Skilled Demonstration;Group Instruction;Verbal Instruction       Education Topics: Hypertension, Hypertension Reduction -Define heart disease and high blood pressure. Discus how high blood pressure affects the body and ways to reduce high blood pressure.   Exercise and Your Heart -Discuss why it is important to exercise, the FITT principles of exercise, normal and abnormal responses to exercise, and how to exercise safely.   Angina -Discuss definition of angina, causes of angina, treatment of angina, and how to decrease risk of having angina.   CARDIAC REHAB PHASE II  EXERCISE from 12/07/2017 in [place]  Date  11/02/17  Educator  DJ  Instruction Review Code  2- Demonstrated Understanding      Cardiac Medications -Review what the following cardiac medications are used for, how they affect the body, and side effects that may occur when taking the medications.  Medications include Aspirin, Beta blockers, calcium channel blockers, ACE Inhibitors, angiotensin receptor blockers, diuretics, digoxin, and antihyperlipidemics.   CARDIAC REHAB PHASE II EXERCISE from 12/07/2017 in [facility]  Date  11/09/17  Educator  DC  Instruction Review Code  2- Demonstrated Understanding      Congestive Heart Failure -Discuss the definition of CHF, how to live with CHF, the signs and symptoms of CHF, and how keep track of weight and sodium intake.   CARDIAC REHAB PHASE II EXERCISE from 12/07/2017 in [place]  Date  11/16/17  Educator  DC  Instruction Review Code  2- Demonstrated Understanding      Heart Disease and Intimacy -Discus the effect sexual activity has on the heart, how changes occur during intimacy as we age, and safety during sexual activity.   Smoking Cessation / COPD -Discuss different methods to quit smoking, the health benefits of quitting smoking, and the  definition of COPD.   CARDIAC REHAB PHASE II EXERCISE from 12/07/2017 in [place]  Date  12/01/17  Educator  DC  Instruction Review Code  2- Demonstrated Understanding      Nutrition I: Fats -Discuss the types of cholesterol, what cholesterol does to the heart, and how cholesterol levels can be controlled.   CARDIAC REHAB PHASE II EXERCISE from 12/07/2017 in [place]  Date  12/07/17  Educator  DC  Instruction Review Code  2- Demonstrated Understanding      Nutrition II: Labels -Discuss the different components of food labels and how to read food label   Heart Parts/Heart Disease and PAD -Discuss the anatomy of the heart, the pathway of blood circulation through the heart, and these are affected by heart disease.   Stress I: Signs and Symptoms -Discuss the causes of stress, how stress may lead to anxiety and depression, and ways to limit stress.   Stress II: Relaxation -Discuss different types of relaxation techniques to limit stress.   Warning Signs of Stroke / TIA -Discuss definition of a stroke, what the signs and symptoms are of a stroke, and how to identify when someone is having stroke.   Knowledge Questionnaire Score: Knowledge Questionnaire Score - 10/25/17 1449      Knowledge Questionnaire Score   Pre Score  21/24       Core Components/Risk Factors/Patient Goals at Admission: Personal Goals and Risk Factors at Admission - 10/25/17 1502      Core Components/Risk Factors/Patient Goals on Admission   Intervention  Weight Management: Provide education and appropriate resources to help participant work on and attain dietary goals.    Admit Weight  179 lb 3.2 oz (81.3 kg)    Goal Weight: Short Term  174 lb 3.2 oz (79 kg)    Goal Weight: Long Term  169 lb 3.2 oz (76.7 kg)    Expected Outcomes  Short Term: Continue to assess and modify interventions until short term weight is achieved;Long Term: Adherence to  nutrition and physical activity/exercise program aimed toward attainment of established weight goal    Diabetes  -- Has diabetes under control.    Personal Goal Other  Yes  Personal Goal  Regain heart health, lose 5-10 pounds.     Intervention  Attend CR 3 x week and supplement with home exercise 2 x week.     Expected Outcomes  Achieve personal goals.        Core Components/Risk Factors/Patient Goals Review:  Goals and Risk Factor Review    Row Name 11/14/17 1440 12/12/17 1446           Core Components/Risk Factors/Patient Goals Review   Personal Goals Review  Weight Management/Obesity;Diabetes Lose 5-10 lbs; get heart healthy again.  Weight Management/Obesity;Diabetes Lose 5-10 lbs; get heart healthy again.      Review  Patient has completed 8 sessions losing 1 lbs. He last A1C was 6 a few months ago. His reported glucose readings have improved since he started after he changed what he was eating for breakfast per our advice. He is progressing on the excercise equipment. Will continue to monitor for progress.   Patient has completed 19 maintaining his weight. He is doing well in the program with progression. His last A1C was 6 a few months ago. His reported fasting glucose readings are WNL. He says the program has helped him immensely. He feels a lot stronger and has more energy now to do more things. Will continue to monitor for progress.       Expected Outcomes  Patient will continue to attend sessions and complete the program meeting his personal goals.   Patient will continue to attend sessions and complete the program meeting his personal goals.          Core Components/Risk Factors/Patient Goals at Discharge (Final Review):  Goals and Risk Factor Review - 12/12/17 1446      Core Components/Risk Factors/Patient Goals Review   Personal Goals Review  Weight Management/Obesity;Diabetes Lose 5-10 lbs; get heart healthy again.    Review  Patient has completed 19 maintaining his  weight. He is doing well in the program with progression. His last A1C was 6 a few months ago. His reported fasting glucose readings are WNL. He says the program has helped him immensely. He feels a lot stronger and has more energy now to do more things. Will continue to monitor for progress.     Expected Outcomes  Patient will continue to attend sessions and complete the program meeting his personal goals.        ITP Comments: ITP Comments    Row Name 10/25/17 1453 10/26/17 1318         ITP Comments  Mr. Leinweber is a 62 year old patient with reocurring stent placement. 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 He did not come to CR at that time. He had another episode where he needed another stent. Patient is now ready to attend CR to regain is strength and regain heart health.   Patient new to program. Plans to start Monday 10/31/17.         Comments: ITP 30 Day REVIEW Patient doing well in the program. Will continue to monitor for progress.

## 2017-12-14 ENCOUNTER — Encounter (HOSPITAL_COMMUNITY)
Admission: RE | Admit: 2017-12-14 | Discharge: 2017-12-14 | Disposition: A | Discharge status: Home / Self Care | Payer: 59 | Source: Ambulatory Visit | Attending: Cardiovascular Disease | Admitting: Cardiovascular Disease

## 2017-12-14 DIAGNOSIS — Z48812 Encounter for surgical aftercare following surgery on the circulatory system: Secondary | ICD-10-CM | POA: Diagnosis not present

## 2017-12-14 DIAGNOSIS — Z955 Presence of coronary angioplasty implant and graft: Secondary | ICD-10-CM

## 2017-12-14 NOTE — Progress Notes (Signed)
Daily Session Note  Patient Details  Name: [name] Foster MRN: [id number] Date of Birth: [birth year] Referring Provider:     CARDIAC REHAB PHASE II ORIENTATION from 10/25/2017 in [place]  Referring Provider  Dr. Burt Knack      Encounter Date: 12/14/2017  Check In: Session Check In - 12/14/17 1001      Check-In   Location  [facility]    Staff Present  Diane Angelina Pih, MS, EP, [facility], Exercise Physiologist;Debra Wynetta Emery, RN, BSN;Carlton Buskey, BS, EP, Exercise Physiologist    Supervising physician immediately available to respond to emergencies  See telemetry face sheet for immediately available MD    Medication changes reported      No    Fall or balance concerns reported     No    Tobacco Cessation  No Change    Warm-up and Cool-down  Performed as group-led instruction    Resistance Training Performed  Yes    VAD Patient?  No      Pain Assessment   Currently in Pain?  No/denies    Pain Score  0-No pain    Multiple Pain Sites  No       Capillary Blood Glucose: No results found for this or any previous visit (from the past 24 hour(s)).    Social History   Tobacco Use  Smoking Status Former Smoker  . Packs/day: 1.00  . Last attempt to quit: 03/25/2017  . Years since quitting: 0.7  Smokeless Tobacco Never Used    Goals Met:  Independence with exercise equipment Exercise tolerated well No report of cardiac concerns or symptoms Strength training completed today  Goals Unmet:  Not Applicable  Comments: Check out 1030   Dr. Kate Sable is Medical Director for [place] and Pulmonary Rehab.

## 2017-12-16 ENCOUNTER — Encounter (HOSPITAL_COMMUNITY)
Admission: RE | Admit: 2017-12-16 | Discharge: 2017-12-16 | Disposition: A | Discharge status: Home / Self Care | Payer: 59 | Source: Ambulatory Visit | Attending: Cardiovascular Disease | Admitting: Cardiovascular Disease

## 2017-12-16 DIAGNOSIS — Z48812 Encounter for surgical aftercare following surgery on the circulatory system: Secondary | ICD-10-CM | POA: Diagnosis not present

## 2017-12-16 NOTE — Progress Notes (Signed)
Daily Session Note  Patient Details  Name: [name] Foster MRN: [id number] Date of Birth: 1955-12-16 Referring Provider:     CARDIAC REHAB PHASE II ORIENTATION from 10/25/2017 in [place]  Referring Provider  Dr. Burt Knack      Encounter Date: 12/16/2017  Check In: Session Check In - 12/16/17 0930      Check-In   Location  [facility]    Staff Present  Pryce Folts Angelina Pih, MS, EP, [facility], Exercise Physiologist;Debra Wynetta Emery, RN, BSN;Gregory Cowan, BS, EP, Exercise Physiologist    Supervising physician immediately available to respond to emergencies  See telemetry face sheet for immediately available MD    Medication changes reported      No    Fall or balance concerns reported     No    Tobacco Cessation  No Change    Warm-up and Cool-down  Performed as group-led instruction    Resistance Training Performed  Yes    VAD Patient?  No      Pain Assessment   Currently in Pain?  No/denies    Pain Score  0-No pain    Multiple Pain Sites  No       Capillary Blood Glucose: No results found for this or any previous visit (from the past 24 hour(s)).    Social History   Tobacco Use  Smoking Status Former Smoker  . Packs/day: 1.00  . Last attempt to quit: 03/25/2017  . Years since quitting: 0.7  Smokeless Tobacco Never Used    Goals Met:  Independence with exercise equipment Exercise tolerated well No report of cardiac concerns or symptoms Strength training completed today  Goals Unmet:  Not Applicable  Comments: Check out: 10:30   Dr. Kate Sable is Medical Director for [place] and Pulmonary Rehab.

## 2017-12-19 ENCOUNTER — Encounter (HOSPITAL_COMMUNITY)
Admission: RE | Admit: 2017-12-19 | Discharge: 2017-12-19 | Disposition: A | Discharge status: Home / Self Care | Payer: 59 | Source: Ambulatory Visit | Attending: Cardiovascular Disease | Admitting: Cardiovascular Disease

## 2017-12-19 DIAGNOSIS — Z955 Presence of coronary angioplasty implant and graft: Secondary | ICD-10-CM

## 2017-12-19 DIAGNOSIS — Z48812 Encounter for surgical aftercare following surgery on the circulatory system: Secondary | ICD-10-CM | POA: Diagnosis not present

## 2017-12-19 NOTE — Progress Notes (Signed)
Daily Session Note  Patient Details  Name: [name] Foster MRN: [id number] Date of Birth: Feb 11, 1956 Referring Provider:     CARDIAC REHAB PHASE II ORIENTATION from 10/25/2017 in [place]  Referring Provider  Dr. Burt Knack      Encounter Date: 12/19/2017  Check In: Session Check In - 12/19/17 1017      Check-In   Location  [facility]    Staff Present  Diane Angelina Pih, MS, EP, [facility], Exercise Physiologist;Debra Wynetta Emery, RN, BSN;Ayden Hardwick, BS, EP, Exercise Physiologist    Supervising physician immediately available to respond to emergencies  See telemetry face sheet for immediately available MD    Medication changes reported      No    Fall or balance concerns reported     No    Tobacco Cessation  No Change    Warm-up and Cool-down  Performed as group-led instruction    Resistance Training Performed  Yes    VAD Patient?  No      Pain Assessment   Currently in Pain?  No/denies    Pain Score  0-No pain    Multiple Pain Sites  No       Capillary Blood Glucose: No results found for this or any previous visit (from the past 24 hour(s)).    Social History   Tobacco Use  Smoking Status Former Smoker  . Packs/day: 1.00  . Last attempt to quit: 03/25/2017  . Years since quitting: 0.7  Smokeless Tobacco Never Used    Goals Met:  Independence with exercise equipment Exercise tolerated well No report of cardiac concerns or symptoms Strength training completed today  Goals Unmet:  Not Applicable  Comments: Check out 1030   Dr. Kate Sable is Medical Director for [place] and Pulmonary Rehab.

## 2017-12-21 ENCOUNTER — Encounter (HOSPITAL_COMMUNITY)
Admission: RE | Admit: 2017-12-21 | Discharge: 2017-12-21 | Disposition: A | Discharge status: Home / Self Care | Payer: 59 | Source: Ambulatory Visit | Attending: Cardiovascular Disease | Admitting: Cardiovascular Disease

## 2017-12-21 DIAGNOSIS — Z955 Presence of coronary angioplasty implant and graft: Secondary | ICD-10-CM

## 2017-12-21 DIAGNOSIS — Z48812 Encounter for surgical aftercare following surgery on the circulatory system: Secondary | ICD-10-CM | POA: Diagnosis not present

## 2017-12-21 NOTE — Progress Notes (Signed)
Daily Session Note  Patient Details  Name: [name] Foster MRN: [id number] Date of Birth: 06/08/1956 Referring Provider:     CARDIAC REHAB PHASE II ORIENTATION from 10/25/2017 in [place]  Referring Provider  Dr. Burt Knack      Encounter Date: 12/21/2017  Check In: Session Check In - 12/21/17 1003      Check-In   Location  [facility]    Staff Present  Diane Angelina Pih, MS, EP, [facility], Exercise Physiologist;Debra Wynetta Emery, RN, BSN;Kasean Denherder, BS, EP, Exercise Physiologist    Supervising physician immediately available to respond to emergencies  See telemetry face sheet for immediately available MD    Medication changes reported      No    Fall or balance concerns reported     No    Tobacco Cessation  No Change    Warm-up and Cool-down  Performed as group-led instruction    Resistance Training Performed  Yes    VAD Patient?  No      Pain Assessment   Currently in Pain?  No/denies    Pain Score  0-No pain    Multiple Pain Sites  No       Capillary Blood Glucose: No results found for this or any previous visit (from the past 24 hour(s)).    Social History   Tobacco Use  Smoking Status Former Smoker  . Packs/day: 1.00  . Last attempt to quit: 03/25/2017  . Years since quitting: 0.7  Smokeless Tobacco Never Used    Goals Met:  Independence with exercise equipment Exercise tolerated well No report of cardiac concerns or symptoms Strength training completed today  Goals Unmet:  Not Applicable  Comments: Check out 1030   Dr. Kate Sable is Medical Director for [place] and Pulmonary Rehab.

## 2017-12-23 ENCOUNTER — Encounter (HOSPITAL_COMMUNITY)
Admission: RE | Admit: 2017-12-23 | Discharge: 2017-12-23 | Disposition: A | Discharge status: Home / Self Care | Payer: 59 | Source: Ambulatory Visit | Attending: Cardiovascular Disease | Admitting: Cardiovascular Disease

## 2017-12-23 DIAGNOSIS — Z48812 Encounter for surgical aftercare following surgery on the circulatory system: Secondary | ICD-10-CM | POA: Diagnosis not present

## 2017-12-23 DIAGNOSIS — Z955 Presence of coronary angioplasty implant and graft: Secondary | ICD-10-CM

## 2017-12-23 NOTE — Progress Notes (Signed)
Daily Session Note  Patient Details  Name: [name] Foster MRN: [id number] Date of Birth: 1955-11-21 Referring Provider:     CARDIAC REHAB PHASE II ORIENTATION from 10/25/2017 in [place]  Referring Provider  Dr. Burt Knack      Encounter Date: 12/23/2017  Check In: Session Check In - 12/23/17 0942      Check-In   Location  [facility]    Staff Present  Aundra Dubin, RN, BSN;Jacobo Moncrief Luther Parody, BS, EP, Exercise Physiologist    Supervising physician immediately available to respond to emergencies  See telemetry face sheet for immediately available MD    Medication changes reported      No    Fall or balance concerns reported     No    Tobacco Cessation  No Change    Warm-up and Cool-down  Performed as group-led instruction    Resistance Training Performed  Yes    VAD Patient?  No      Pain Assessment   Currently in Pain?  No/denies    Pain Score  0-No pain    Multiple Pain Sites  No       Capillary Blood Glucose: No results found for this or any previous visit (from the past 24 hour(s)).    Social History   Tobacco Use  Smoking Status Former Smoker  . Packs/day: 1.00  . Last attempt to quit: 03/25/2017  . Years since quitting: 0.7  Smokeless Tobacco Never Used    Goals Met:  Independence with exercise equipment Exercise tolerated well No report of cardiac concerns or symptoms Strength training completed today  Goals Unmet:  Not Applicable  Comments: Check out 1030   Dr. Kate Sable is Medical Director for [place] and Pulmonary Rehab.

## 2017-12-26 ENCOUNTER — Encounter (HOSPITAL_COMMUNITY)
Admission: RE | Admit: 2017-12-26 | Discharge: 2017-12-26 | Disposition: A | Discharge status: Home / Self Care | Payer: 59 | Source: Ambulatory Visit | Attending: Cardiovascular Disease | Admitting: Cardiovascular Disease

## 2017-12-26 DIAGNOSIS — Z955 Presence of coronary angioplasty implant and graft: Secondary | ICD-10-CM

## 2017-12-26 DIAGNOSIS — Z48812 Encounter for surgical aftercare following surgery on the circulatory system: Secondary | ICD-10-CM | POA: Diagnosis not present

## 2017-12-26 NOTE — Progress Notes (Signed)
Daily Session Note  Patient Details  Name: SHERIFF RODENBERG MRN: [id number] Date of Birth: February 27, 1956 Referring Provider:     CARDIAC REHAB PHASE II ORIENTATION from 10/25/2017 in [place]  Referring Provider  Dr. Burt Knack      Encounter Date: 12/26/2017  Check In: Session Check In - 12/26/17 0946      Check-In   Location  [facility]    Staff Present  Aundra Dubin, RN, BSN;Trung Wenzl Luther Parody, BS, EP, Exercise Physiologist    Supervising physician immediately available to respond to emergencies  See telemetry face sheet for immediately available MD    Medication changes reported      No    Fall or balance concerns reported     No    Tobacco Cessation  No Change    Warm-up and Cool-down  Performed as group-led instruction    Resistance Training Performed  Yes    VAD Patient?  No      Pain Assessment   Currently in Pain?  No/denies    Pain Score  0-No pain    Multiple Pain Sites  No       Capillary Blood Glucose: No results found for this or any previous visit (from the past 24 hour(s)).    Social History   Tobacco Use  Smoking Status Former Smoker  . Packs/day: 1.00  . Last attempt to quit: 03/25/2017  . Years since quitting: 0.7  Smokeless Tobacco Never Used    Goals Met:  Independence with exercise equipment Exercise tolerated well No report of cardiac concerns or symptoms Strength training completed today  Goals Unmet:  Not Applicable  Comments: Check out 1030   Dr. Kate Sable is Medical Director for [place] and Pulmonary Rehab.

## 2017-12-27 ENCOUNTER — Telehealth: Payer: Self-pay | Admitting: Cardiovascular Disease

## 2017-12-27 NOTE — Telephone Encounter (Signed)
New Message:    Pt called and wanted to know when does he need his next appt? It was not in the Recall.

## 2017-12-27 NOTE — Telephone Encounter (Signed)
Confirmed with patient's wife he is asymptomatic. She states "he feels great." Scheduled patient 6/3 for evaluation with Dr. Burt Knack. Patient's wife agrees with treatment plan.

## 2017-12-28 ENCOUNTER — Encounter (HOSPITAL_COMMUNITY): Payer: 59

## 2017-12-30 ENCOUNTER — Encounter (HOSPITAL_COMMUNITY): Payer: 59

## 2018-01-02 ENCOUNTER — Encounter (HOSPITAL_COMMUNITY): Payer: 59

## 2018-01-04 ENCOUNTER — Encounter (HOSPITAL_COMMUNITY): Payer: 59

## 2018-01-06 ENCOUNTER — Encounter (HOSPITAL_COMMUNITY)
Admission: RE | Admit: 2018-01-06 | Discharge: 2018-01-06 | Disposition: A | Discharge status: Home / Self Care | Payer: 59 | Source: Ambulatory Visit | Attending: Cardiovascular Disease | Admitting: Cardiovascular Disease

## 2018-01-06 DIAGNOSIS — Z48812 Encounter for surgical aftercare following surgery on the circulatory system: Secondary | ICD-10-CM | POA: Diagnosis not present

## 2018-01-06 DIAGNOSIS — Z955 Presence of coronary angioplasty implant and graft: Secondary | ICD-10-CM

## 2018-01-06 NOTE — Progress Notes (Signed)
Daily Session Note  Patient Details  Name: [name] Foster MRN: [id number] Date of Birth: Apr 25, 1956 Referring Provider:     CARDIAC REHAB PHASE II ORIENTATION from 10/25/2017 in [place]  Referring Provider  Dr. Burt Knack      Encounter Date: 01/06/2018  Check In: Session Check In - 01/06/18 1131      Check-In   Location  [facility]    Staff Present  Suzanne Boron, BS, EP, Exercise Physiologist;Diane Coad, MS, EP, [facility], Exercise Physiologist    Supervising physician immediately available to respond to emergencies  See telemetry face sheet for immediately available MD    Medication changes reported      No    Fall or balance concerns reported     No    Warm-up and Cool-down  Performed as group-led instruction    Resistance Training Performed  Yes    VAD Patient?  No      Pain Assessment   Currently in Pain?  No/denies    Pain Score  0-No pain    Multiple Pain Sites  No       Capillary Blood Glucose: No results found for this or any previous visit (from the past 24 hour(s)).    Social History   Tobacco Use  Smoking Status Former Smoker  . Packs/day: 1.00  . Last attempt to quit: 03/25/2017  . Years since quitting: 0.7  Smokeless Tobacco Never Used    Goals Met:  Independence with exercise equipment Exercise tolerated well No report of cardiac concerns or symptoms Strength training completed today  Goals Unmet:  Not Applicable  Comments: Check out 1030   Dr. Kate Sable is Medical Director for [place] and Pulmonary Rehab.

## 2018-01-09 ENCOUNTER — Encounter (HOSPITAL_COMMUNITY)
Admission: RE | Admit: 2018-01-09 | Discharge: 2018-01-09 | Disposition: A | Discharge status: Home / Self Care | Payer: 59 | Source: Ambulatory Visit | Attending: Cardiovascular Disease | Admitting: Cardiovascular Disease

## 2018-01-09 DIAGNOSIS — Z48812 Encounter for surgical aftercare following surgery on the circulatory system: Secondary | ICD-10-CM | POA: Diagnosis not present

## 2018-01-09 DIAGNOSIS — Z955 Presence of coronary angioplasty implant and graft: Secondary | ICD-10-CM

## 2018-01-09 NOTE — Progress Notes (Signed)
Cardiac Individual Treatment Plan  Patient Details  Name: [name] Foster MRN: [id number] Date of Birth: 1956-05-07 Referring Provider:     CARDIAC REHAB PHASE II ORIENTATION from 10/25/2017 in [place]  Referring Provider  Dr. Burt Knack      Initial Encounter Date:    CARDIAC REHAB PHASE II ORIENTATION from 10/25/2017 in [place]  Date  10/25/17  Referring Provider  Dr. Burt Knack      Visit Diagnosis: Status post coronary artery stent placement  Patient's Home Medications on Admission:  Current Outpatient Medications:  .  aspirin EC 81 MG EC tablet, Take 1 tablet (81 mg total) by mouth daily., Disp: 30 tablet, Rfl: 11 .  atorvastatin (LIPITOR) 80 MG tablet, Take 1 tablet (80 mg total) by mouth daily at 6 PM., Disp: 30 tablet, Rfl: 11 .  gabapentin (NEURONTIN) 300 MG capsule, Take 300 mg by mouth 3 (three) times daily as needed (NERVE PAIN). , Disp: , Rfl:  .  HYDROcodone-acetaminophen (NORCO) 7.5-325 MG tablet, Take 1 tablet by mouth every 6 (six) hours as needed for moderate pain., Disp: , Rfl:  .  isosorbide mononitrate (IMDUR) 30 MG 24 hr tablet, Take 0.5 tablets (15 mg total) by mouth daily., Disp: 15 tablet, Rfl: 11 .  losartan (COZAAR) 50 MG tablet, Take 1 tablet (50 mg total) by mouth daily., Disp: 30 tablet, Rfl: 11 .  metFORMIN (GLUCOPHAGE) 1000 MG tablet, Take 500 mg by mouth 2 (two) times daily with a meal., Disp: , Rfl: 6 .  metoprolol tartrate (LOPRESSOR) 50 MG tablet, Take 1 tablet (50 mg total) by mouth 2 (two) times daily., Disp: 180 tablet, Rfl: 3 .  nitroGLYCERIN (NITROSTAT) 0.4 MG SL tablet, Place 1 tablet (0.4 mg total) under the tongue every 5 (five) minutes x 3 doses as needed for chest pain., Disp: 25 tablet, Rfl: 11 .  ticagrelor (BRILINTA) 90 MG TABS tablet, Take 1 tablet (90 mg total) by mouth 2 (two) times daily., Disp: 60 tablet, Rfl: 0 .  zolpidem (AMBIEN) 10 MG tablet, Take 10 mg by mouth at bedtime as needed  for sleep. , Disp: , Rfl:   Past Medical History: Past Medical History:  Diagnosis Date  . CAD in native artery    a. 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 DES to left PDA 09/20/17  . Diabetes mellitus without complication ([place])   . High cholesterol   . Hyperlipidemia   . Hypertension   . Insomnia   . Pulmonary hypertension ([place])    a. TTE 03/25/17; EF 60-65%, nl WM, LV diastolic fxn nl, trivial AI, RV cavity size, wall thickness, and sys fxn nl, trivial TR, PASP 39 mmHg, trivial pericardial effusion  . Sleep apnea    could not use a cpap  . Wears dentures    full top-partial bottom  . Wears glasses     Tobacco Use: Social History   Tobacco Use  Smoking Status Former Smoker  . Packs/day: 1.00  . Last attempt to quit: 03/25/2017  . Years since quitting: 0.7  Smokeless Tobacco Never Used    Labs: Recent Review Scientist, physiological    Labs for ITP Cardiac and Pulmonary Rehab Latest Ref Rng & Units 03/25/2017 05/13/2017   Cholestrol 100 - 199 mg/dL 163 81(L)   LDLCALC 0 - 99 mg/dL 92 41   HDL >  39 mg/dL 25(L) 24(L)   Trlycerides 0 - 149 mg/dL 229(H) 82   Hemoglobin A1c 4.8 - 5.6 % 9.4(H) -   PHART 7.350 - 7.450 7.206(L) -   PCO2ART 32.0 - 48.0 mmHg 53.8(H) -   HCO3 20.0 - 28.0 mmol/L 21.3 -   TCO2 0 - 100 mmol/L 23 -   ACIDBASEDEF 0.0 - 2.0 mmol/L 7.0(H) -   O2SAT % 99.0 -      Capillary Blood Glucose: Lab Results  Component Value Date   GLUCAP 92 09/20/2017   GLUCAP 128 (H) 09/20/2017   GLUCAP 115 (H) 03/27/2017   GLUCAP 105 (H) 03/27/2017   GLUCAP 106 (H) 03/27/2017     Exercise Target Goals:    Exercise Program Goal: Individual exercise prescription set using results from initial 6 min walk test and THRR while considering  patient's activity barriers and safety.   Exercise Prescription Goal: Starting with aerobic activity 30 plus minutes  a day, 3 days per week for initial exercise prescription. Provide home exercise prescription and guidelines that participant acknowledges understanding prior to discharge.  Activity Barriers & Risk Stratification: Activity Barriers & Cardiac Risk Stratification - 10/25/17 1413      Activity Barriers & Cardiac Risk Stratification   Activity Barriers  None    Cardiac Risk Stratification  High       6 Minute Walk: 6 Minute Walk    Row Name 10/25/17 1412         6 Minute Walk   Phase  Initial     Distance  1550 feet     Distance % Change  0 %     Distance Feet Change  0 ft     Walk Time  6 minutes     # of Rest Breaks  0     MPH  2.93     METS  3.24     RPE  12     Perceived Dyspnea   13     VO2 Peak  14.79     Symptoms  No     Resting HR  60 bpm     Resting BP  172/86     Resting Oxygen Saturation   97 %     Exercise Oxygen Saturation  during 6 min walk  97 %     Max Ex. HR  90 bpm     Max Ex. BP  208/90     2 Minute Post BP  180/84        Oxygen Initial Assessment:   Oxygen Re-Evaluation:   Oxygen Discharge (Final Oxygen Re-Evaluation):   Initial Exercise Prescription: Initial Exercise Prescription - 10/25/17 1400      Date of Initial Exercise RX and Referring Provider   Date  10/25/17    Referring Provider  Dr. Burt Knack      Treadmill   MPH  2    Grade  0    Minutes  15    METs  2.5      Recumbant Elliptical   Level  1    RPM  45    Watts  58    Minutes  20    METs  3.2      Prescription Details   Frequency (times per week)  3    Duration  Progress to 30 minutes of continuous aerobic without signs/symptoms of physical distress      Intensity   THRR 40-80% of Max Heartrate  [phone]    Ratings  of Perceived Exertion  11-13    Perceived Dyspnea  0-4      Progression   Progression  Continue progressive overload as per policy without signs/symptoms or physical distress.      Resistance Training   Training Prescription  Yes    Weight  1     Reps  10-15       Perform Capillary Blood Glucose checks as needed.  Exercise Prescription Changes:  Exercise Prescription Changes    Row Name 11/02/17 0900 11/10/17 1500 11/29/17 0800 12/07/17 1400 01/03/18 1000     Response to Exercise   Blood Pressure (Admit)  -  170/100  150/72  148/70  156/78   Blood Pressure (Exercise)  -  172/70  158/80  170/72  160/76   Blood Pressure (Exit)  -  150/66  144/72  140/74  144/74   Heart Rate (Admit)  -  74 bpm  54 bpm  64 bpm  50 bpm   Heart Rate (Exercise)  -  104 bpm  70 bpm  96 bpm  89 bpm   Heart Rate (Exit)  -  77 bpm  63 bpm  72 bpm  59 bpm   Rating of Perceived Exertion (Exercise)  -  '11  11  9  10   '$ Duration  -  Progress to 30 minutes of  aerobic without signs/symptoms of physical distress  Progress to 30 minutes of  aerobic without signs/symptoms of physical distress  Progress to 30 minutes of  aerobic without signs/symptoms of physical distress  Progress to 30 minutes of  aerobic without signs/symptoms of physical distress   Intensity  -  THRR New [phone]  THRR New [phone]  THRR New 102-120-139  THRR New [phone]     Progression   Progression  -  Continue to progress workloads to maintain intensity without signs/symptoms of physical distress.  Continue to progress workloads to maintain intensity without signs/symptoms of physical distress.  Continue to progress workloads to maintain intensity without signs/symptoms of physical distress.  Continue to progress workloads to maintain intensity without signs/symptoms of physical distress.     Resistance Training   Training Prescription  Yes  Yes  Yes  Yes  Yes   Weight  '1  1  3  4  4   '$ Reps  10-15  10-15  10-15  10-15  10-15     Treadmill   MPH  2  2  2.2  2.3  2.7   Grade  0  0  0  0  0   Minutes  '15  15  15  15  15   '$ METs  2.5  2.5  2.6  2.7  3     Recumbant Elliptical   Level  '1  1  2  2  3   '$ RPM  45  62  74  73  60   Watts  58  80  105  112  86   Minutes  '20  20  20  20   20   '$ METs  3.2  4.5  5.7  6.2  4.8     Home Exercise Plan   Plans to continue exercise at  Home (comment)  Home (comment)  Home (comment)  Home (comment)  Home (comment)   Frequency  Add 2 additional days to program exercise sessions.  Add 2 additional days to program exercise sessions.  Add 2 additional days to program exercise sessions.  Add 2 additional days  to program exercise sessions.  Add 2 additional days to program exercise sessions.   Initial Home Exercises Provided  11/02/17  11/02/17  11/02/17  11/02/17  11/02/17      Exercise Comments:  Exercise Comments    Row Name 11/02/17 [phone] 11/10/17 1532 11/29/17 0821 12/07/17 1444 01/03/18 1019   Exercise Comments  Patient received the take home exercise plan today 11/02/2017. THR was addressed as were safety guidelines for being active when not in CR. Patient demonstrated an understanding and was encouraged to ask any future questions as they arise.   Patient is doing very well in CR and has maintained his speed on the treadmill and has increased his watts and RPMs on the recumbent elliptical. Patient has only recently started the program and has not been progressed much due to his abnormally high BP. His physicians have been contacted and he will be progressed in time throughout the remainder of the prorgam.   Patient is doing well in Cr and has progressed on both the treadmill as well as the recumbent elliptical. patients blood pressure has also decreased. Patient will continue to grow stronger and will be monitored throughout the remainder of the program. Goals will be met.   Patient is doing very well in CR and has maintained his speed on the treadmill and has increased his watts and RPMs on the recumbent elliptical. Patient has stated to me that he has much more energy than before and is able to do much more when not here in CR and his stamina has also increased. His BP continues to be monitored.  Patient continues to do well in CR. Patient has  increased his treadmill speed to 2.7 and has also increased his recumbent elliptical to level 3. Patient has stated to me that he feels much stronger since beginning the program and that he feels more stamina as well. Patient seems to have a better graspe on his blood glucose level as well as seen in his levels for each class.       Exercise Goals and Review:  Exercise Goals    Row Name 10/25/17 1413             Exercise Goals   Increase Physical Activity  Yes       Intervention  Provide advice, education, support and counseling about physical activity/exercise needs.;Develop an individualized exercise prescription for aerobic and resistive training based on initial evaluation findings, risk stratification, comorbidities and participant's personal goals.       Expected Outcomes  Short Term: Attend rehab on a regular basis to increase amount of physical activity.       Increase Strength and Stamina  Yes       Intervention  Provide advice, education, support and counseling about physical activity/exercise needs.;Develop an individualized exercise prescription for aerobic and resistive training based on initial evaluation findings, risk stratification, comorbidities and participant's personal goals.       Expected Outcomes  Short Term: Increase workloads from initial exercise prescription for resistance, speed, and METs.       Able to understand and use rate of perceived exertion (RPE) scale  Yes       Intervention  Provide education and explanation on how to use RPE scale       Expected Outcomes  Short Term: Able to use RPE daily in rehab to express subjective intensity level;Long Term:  Able to use RPE to guide intensity level when exercising independently  Able to understand and use Dyspnea scale  Yes       Intervention  Provide education and explanation on how to use Dyspnea scale       Expected Outcomes  Long Term: Able to use Dyspnea scale to guide intensity level when exercising  independently;Short Term: Able to use Dyspnea scale daily in rehab to express subjective sense of shortness of breath during exertion       Knowledge and understanding of Target Heart Rate Range (THRR)  Yes       Intervention  Provide education and explanation of THRR including how the numbers were predicted and where they are located for reference       Expected Outcomes  Short Term: Able to state/look up THRR;Short Term: Able to use daily as guideline for intensity in rehab;Long Term: Able to use THRR to govern intensity when exercising independently       Able to check pulse independently  Yes       Intervention  Review the importance of being able to check your own pulse for safety during independent exercise;Provide education and demonstration on how to check pulse in carotid and radial arteries.       Expected Outcomes  Short Term: Able to explain why pulse checking is important during independent exercise;Long Term: Able to check pulse independently and accurately       Understanding of Exercise Prescription  Yes       Intervention  Provide education, explanation, and written materials on patient's individual exercise prescription       Expected Outcomes  Long Term: Able to explain home exercise prescription to exercise independently;Short Term: Able to explain program exercise prescription          Exercise Goals Re-Evaluation : Exercise Goals Re-Evaluation    Row Name 11/10/17 1530 12/07/17 1443 01/06/18 1419         Exercise Goal Re-Evaluation   Exercise Goals Review  Increase Physical Activity;Able to understand and use rate of perceived exertion (RPE) scale;Knowledge and understanding of Target Heart Rate Range (THRR);Able to understand and use Dyspnea scale;Understanding of Exercise Prescription  Increase Physical Activity;Able to understand and use rate of perceived exertion (RPE) scale;Knowledge and understanding of Target Heart Rate Range (THRR);Able to understand and use Dyspnea  scale;Understanding of Exercise Prescription  Increase Physical Activity;Able to understand and use rate of perceived exertion (RPE) scale;Knowledge and understanding of Target Heart Rate Range (THRR);Able to understand and use Dyspnea scale;Understanding of Exercise Prescription     Comments  Patient is doing very well in CR and has maintained his speed on the treadmill and has increased his watts and RPMs on the recumbent elliptical. Patient has only recently started the program and has not been progressed much due to his abnormally high BP. His physicians have been contacted and he will be progressed in time throughout the remainder of the prorgam.   Patient is doing very well in CR and has maintained his speed on the treadmill and has increased his watts and RPMs on the recumbent elliptical. Patient has stated to me that he has much more energy than before and is able to do much more when not here in CR and his stamina has also increased. His BP continues to be monitored.   Patienty has been doing very well in CR and has been progressing on both of his machines, the treadmill and the recumbent elliptical. Patient has shown strength increase through weights in the warm up portion of each  class. Patients BP has not been as high as previously      Expected Outcomes  Patient wishes to lose weight and to become heart healthy again.   Patient wishes to lose weight and to become heart healthy again.   Patient wishes to lose weight and to become heart healthy again.          Discharge Exercise Prescription (Final Exercise Prescription Changes): Exercise Prescription Changes - 01/03/18 1000      Response to Exercise   Blood Pressure (Admit)  156/78    Blood Pressure (Exercise)  160/76    Blood Pressure (Exit)  144/74    Heart Rate (Admit)  50 bpm    Heart Rate (Exercise)  89 bpm    Heart Rate (Exit)  59 bpm    Rating of Perceived Exertion (Exercise)  10    Duration  Progress to 30 minutes of  aerobic  without signs/symptoms of physical distress    Intensity  THRR New [phone]      Progression   Progression  Continue to progress workloads to maintain intensity without signs/symptoms of physical distress.      Resistance Training   Training Prescription  Yes    Weight  4    Reps  10-15      Treadmill   MPH  2.7    Grade  0    Minutes  15    METs  3      Recumbant Elliptical   Level  3    RPM  60    Watts  86    Minutes  20    METs  4.8      Home Exercise Plan   Plans to continue exercise at  Home (comment)    Frequency  Add 2 additional days to program exercise sessions.    Initial Home Exercises Provided  11/02/17       Nutrition:  Target Goals: Understanding of nutrition guidelines, daily intake of sodium '1500mg'$ , cholesterol '200mg'$ , calories 30% from fat and 7% or less from saturated fats, daily to have 5 or more servings of fruits and vegetables.  Biometrics: Pre Biometrics - 10/25/17 1414      Pre Biometrics   Height  '5\' 8"'$  (1.727 m)    Weight  179 lb 3.2 oz (81.3 kg)    Waist Circumference  39 inches    Hip Circumference  37 inches    Waist to Hip Ratio  1.05 %    BMI (Calculated)  27.25    Triceps Skinfold  7 mm    % Body Fat  23.36 %    Grip Strength  79.73 kg    Flexibility  14.83 in    Single Leg Stand  60 seconds        Nutrition Therapy Plan and Nutrition Goals: Nutrition Therapy & Goals - 11/17/17 1413      Personal Nutrition Goals   Nutrition Goal  For heart healthy choices add >50% of whole grains, make half their plate fruits and vegetables. Discuss the difference between starchy vegetables and leafy greens, and how leafy vegetables provide fiber, helps maintain healthy weight, helps control blood glucose, and lowers cholesterol.  Discuss purchasing fresh or frozen vegetable to reduce sodium and not to add grease, fat or sugar. Consume <18oz of red meat per week. Consume lean cuts of meats and very little of meats high in sodium and nitrates  such as pork and lunch meats. Discussed portion control for all  food groups.        Intervention Plan   Intervention  Nutrition handout(s) given to patient.    Expected Outcomes  Short Term Goal: Understand basic principles of dietary content, such as calories, fat, sodium, cholesterol and nutrients.       Nutrition Assessments: Nutrition Assessments - 10/25/17 1501      MEDFICTS Scores   Pre Score  27       Nutrition Goals Re-Evaluation: Nutrition Goals Re-Evaluation    [place] Name 12/12/17 1444 01/09/18 1508           Goals   Current Weight  179 lb (81.2 kg)  180 lb 1.6 oz (81.7 kg)      Nutrition Goal  For heart healthy choices add >50% of whole grains, make half their plate fruits and vegetables. Discuss the difference between starchy vegetables and leafy greens, and how leafy vegetables provide fiber, helps maintain healthy weight, helps control blood glucose, and lowers cholesterol.  Discuss purchasing fresh or frozen vegetable to reduce sodium and not to add grease, fat or sugar. Consume <18oz of red meat per week. Consume lean cuts of meats and very little of meats high in sodium and nitrates such as pork and lunch meats. Discussed portion control for all food groups.    For heart healthy choices add >50% of whole grains, make half their plate fruits and vegetables. Discuss the difference between starchy vegetables and leafy greens, and how leafy vegetables provide fiber, helps maintain healthy weight, helps control blood glucose, and lowers cholesterol.  Discuss purchasing fresh or frozen vegetable to reduce sodium and not to add grease, fat or sugar. Consume <18oz of red meat per week. Consume lean cuts of meats and very little of meats high in sodium and nitrates such as pork and lunch meats. Discussed portion control for all food groups.        Comment  Patient has maintained his weight. He continues to eat a diabetic diet and trys to make healthy choices.   Patient has gained 1 lb  since last 30 day review. He continues to eat a diabetic diet and trys to make healthly choices. Will continue to monitor.       Expected Outcome  -  Patient will continue to meet his nutrition goals.         Personal Goal #2 Re-Evaluation   Personal Goal #2  Patient is eating a diabetic diet anlong with making heart healthy choices.   Patient is eating a diabetic diet anlong with making heart healthy choices.          Nutrition Goals Discharge (Final Nutrition Goals Re-Evaluation): Nutrition Goals Re-Evaluation - 01/09/18 1508      Goals   Current Weight  180 lb 1.6 oz (81.7 kg)    Nutrition Goal  For heart healthy choices add >50% of whole grains, make half their plate fruits and vegetables. Discuss the difference between starchy vegetables and leafy greens, and how leafy vegetables provide fiber, helps maintain healthy weight, helps control blood glucose, and lowers cholesterol.  Discuss purchasing fresh or frozen vegetable to reduce sodium and not to add grease, fat or sugar. Consume <18oz of red meat per week. Consume lean cuts of meats and very little of meats high in sodium and nitrates such as pork and lunch meats. Discussed portion control for all food groups.      Comment  Patient has gained 1 lb since last 30 day review. He continues to eat a  diabetic diet and trys to make healthly choices. Will continue to monitor.     Expected Outcome  Patient will continue to meet his nutrition goals.       Personal Goal #2 Re-Evaluation   Personal Goal #2  Patient is eating a diabetic diet anlong with making heart healthy choices.        Psychosocial: Target Goals: Acknowledge presence or absence of significant depression and/or stress, maximize coping skills, provide positive support system. Participant is able to verbalize types and ability to use techniques and skills needed for reducing stress and depression.  Initial Review & Psychosocial Screening: Initial Psych Review & Screening -  10/25/17 1506      Initial Review   Current issues with  None Identified    Source of Stress Concerns  None Identified      Family Dynamics   Good Support System?  Yes      Barriers   Psychosocial barriers to participate in program  There are no identifiable barriers or psychosocial needs.      Screening Interventions   Interventions  Encouraged to exercise    Expected Outcomes  Short Term goal: Identification and review with participant of any Quality of Life or Depression concerns found by scoring the questionnaire.;Long Term goal: The participant improves quality of Life and PHQ9 Scores as seen by post scores and/or verbalization of changes       Quality of Life Scores: Quality of Life - 10/25/17 1415      Quality of Life Scores   Health/Function Pre  20.4 %    Socioeconomic Pre  26.86 %    Psych/Spiritual Pre  23.79 %    Family Pre  28.8 %    GLOBAL Pre  23.66 %      Scores of 19 and below usually indicate a poorer quality of life in these areas.  A difference of  2-3 points is a clinically meaningful difference.  A difference of 2-3 points in the total score of the Quality of Life Index has been associated with significant improvement in overall quality of life, self-image, physical symptoms, and general health in studies assessing change in quality of life.  PHQ-9: Recent Review Flowsheet Data    Depression screen [facility] 2/9 10/25/2017 04/26/2017   Decreased Interest 0 0   Down, Depressed, Hopeless 0 0   PHQ - 2 Score 0 0   Altered sleeping 1 -   Tired, decreased energy 1 -   Change in appetite 0 -   Feeling bad or failure about yourself  0 -   Trouble concentrating 0 -   Moving slowly or fidgety/restless 0 -   Suicidal thoughts 0 -   PHQ-9 Score 2 -   Difficult doing work/chores Not difficult at all -     Interpretation of Total Score  Total Score Depression Severity:  1-4 = Minimal depression, 5-9 = Mild depression, 10-14 = Moderate depression, 15-19 = Moderately  severe depression, 20-27 = Severe depression   Psychosocial Evaluation and Intervention: Psychosocial Evaluation - 10/25/17 1507      Psychosocial Evaluation & Interventions   Interventions  Encouraged to exercise with the program and follow exercise prescription    Continue Psychosocial Services   No Follow up required       Psychosocial Re-Evaluation: Psychosocial Re-Evaluation    [place] Name 11/14/17 1443 12/12/17 1455 01/09/18 1512         Psychosocial Re-Evaluation   Current issues with  None Identified  None Identified  None Identified     Comments  Patient's initial QOL score was 23.66 and her PHQ-9 was 2. No issues identified.   Patient's initial QOL score was 23.66 and her PHQ-9 was 2. No issues identified.   Patient's initial QOL score was 23.66 and her PHQ-9 was 2. No issues identified.      Expected Outcomes  Patient will have no psychosocial issues identified at discharge.   Patient will have no psychosocial issues identified at discharge.   Patient will have no psychosocial issues identified at discharge.      Interventions  Stress management education;Encouraged to attend Cardiac Rehabilitation for the exercise;Relaxation education  Stress management education;Encouraged to attend Cardiac Rehabilitation for the exercise;Relaxation education  Stress management education;Encouraged to attend Cardiac Rehabilitation for the exercise;Relaxation education     Continue Psychosocial Services   No Follow up required  No Follow up required  No Follow up required        Psychosocial Discharge (Final Psychosocial Re-Evaluation): Psychosocial Re-Evaluation - 01/09/18 1512      Psychosocial Re-Evaluation   Current issues with  None Identified    Comments  Patient's initial QOL score was 23.66 and her PHQ-9 was 2. No issues identified.     Expected Outcomes  Patient will have no psychosocial issues identified at discharge.     Interventions  Stress management education;Encouraged to  attend Cardiac Rehabilitation for the exercise;Relaxation education    Continue Psychosocial Services   No Follow up required       Vocational Rehabilitation: Provide vocational rehab assistance to qualifying candidates.   Vocational Rehab Evaluation & Intervention: Vocational Rehab - 10/25/17 1451      Initial Vocational Rehab Evaluation & Intervention   Assessment shows need for Vocational Rehabilitation  No       Education: Education Goals: Education classes will be provided on a weekly basis, covering required topics. Participant will state understanding/return demonstration of topics presented.  Learning Barriers/Preferences: Learning Barriers/Preferences - 10/25/17 1448      Learning Barriers/Preferences   Learning Barriers  None    Learning Preferences  Skilled Demonstration;Group Instruction;Verbal Instruction       Education Topics: Hypertension, Hypertension Reduction -Define heart disease and high blood pressure. Discus how high blood pressure affects the body and ways to reduce high blood pressure.   Exercise and Your Heart -Discuss why it is important to exercise, the FITT principles of exercise, normal and abnormal responses to exercise, and how to exercise safely.   Angina -Discuss definition of angina, causes of angina, treatment of angina, and how to decrease risk of having angina.   CARDIAC REHAB PHASE II EXERCISE from 12/21/2017 in [place]  Date  11/02/17  Educator  DJ  Instruction Review Code  2- Demonstrated Understanding      Cardiac Medications -Review what the following cardiac medications are used for, how they affect the body, and side effects that may occur when taking the medications.  Medications include Aspirin, Beta blockers, calcium channel blockers, ACE Inhibitors, angiotensin receptor blockers, diuretics, digoxin, and antihyperlipidemics.   CARDIAC REHAB PHASE II EXERCISE from 12/21/2017 in [place]  Date  11/09/17  Educator  DC  Instruction Review Code  2- Demonstrated Understanding      Congestive Heart Failure -Discuss the definition of CHF, how to live with CHF, the signs and symptoms of CHF, and how keep track of weight and sodium intake.   CARDIAC REHAB PHASE II EXERCISE from  12/21/2017 in [place]  Date  11/16/17  Educator  DC  Instruction Review Code  2- Demonstrated Understanding      Heart Disease and Intimacy -Discus the effect sexual activity has on the heart, how changes occur during intimacy as we age, and safety during sexual activity.   Smoking Cessation / COPD -Discuss different methods to quit smoking, the health benefits of quitting smoking, and the definition of COPD.   CARDIAC REHAB PHASE II EXERCISE from 12/21/2017 in [place]  Date  12/01/17  Educator  DC  Instruction Review Code  2- Demonstrated Understanding      Nutrition I: Fats -Discuss the types of cholesterol, what cholesterol does to the heart, and how cholesterol levels can be controlled.   CARDIAC REHAB PHASE II EXERCISE from 12/21/2017 in [place]  Date  12/07/17  Educator  DC  Instruction Review Code  2- Demonstrated Understanding      Nutrition II: Labels -Discuss the different components of food labels and how to read food label   CARDIAC REHAB PHASE II EXERCISE from 12/21/2017 in [place]  Date  12/14/17  Educator  DJ  Instruction Review Code  2- Demonstrated Understanding      Heart Parts/Heart Disease and PAD -Discuss the anatomy of the heart, the pathway of blood circulation through the heart, and these are affected by heart disease.   CARDIAC REHAB PHASE II EXERCISE from 12/21/2017 in [place]  Date  12/21/17  Educator  DJ  Instruction Review Code  2- Demonstrated Understanding      Stress I: Signs and Symptoms -Discuss the causes  of stress, how stress may lead to anxiety and depression, and ways to limit stress.   Stress II: Relaxation -Discuss different types of relaxation techniques to limit stress.   Warning Signs of Stroke / TIA -Discuss definition of a stroke, what the signs and symptoms are of a stroke, and how to identify when someone is having stroke.   Knowledge Questionnaire Score: Knowledge Questionnaire Score - 10/25/17 1449      Knowledge Questionnaire Score   Pre Score  21/24       Core Components/Risk Factors/Patient Goals at Admission: Personal Goals and Risk Factors at Admission - 10/25/17 1502      Core Components/Risk Factors/Patient Goals on Admission   Intervention  Weight Management: Provide education and appropriate resources to help participant work on and attain dietary goals.    Admit Weight  179 lb 3.2 oz (81.3 kg)    Goal Weight: Short Term  174 lb 3.2 oz (79 kg)    Goal Weight: Long Term  169 lb 3.2 oz (76.7 kg)    Expected Outcomes  Short Term: Continue to assess and modify interventions until short term weight is achieved;Long Term: Adherence to nutrition and physical activity/exercise program aimed toward attainment of established weight goal    Diabetes  -- Has diabetes under control.    Personal Goal Other  Yes    Personal Goal  Regain heart health, lose 5-10 pounds.     Intervention  Attend CR 3 x week and supplement with home exercise 2 x week.     Expected Outcomes  Achieve personal goals.        Core Components/Risk Factors/Patient Goals Review:  Goals and Risk Factor Review    Row Name 11/14/17 1440 12/12/17 1446 01/09/18 1510         Core Components/Risk Factors/Patient  Goals Review   Personal Goals Review  Weight Management/Obesity;Diabetes Lose 5-10 lbs; get heart healthy again.  Weight Management/Obesity;Diabetes Lose 5-10 lbs; get heart healthy again.  Weight Management/Obesity;Diabetes Lose 5-10 lbs; get heart healthy again.      Review  Patient has  completed 8 sessions losing 1 lbs. He last A1C was 6 a few months ago. His reported glucose readings have improved since he started after he changed what he was eating for breakfast per our advice. He is progressing on the excercise equipment. Will continue to monitor for progress.   Patient has completed 19 maintaining his weight. He is doing well in the program with progression. His last A1C was 6 a few months ago. His reported fasting glucose readings are WNL. He says the program has helped him immensely. He feels a lot stronger and has more energy now to do more things. Will continue to monitor for progress.   Patient has completed 27 sessions maintaining his weight. He continues to do well in the program with progression. No recent A1C lab. His reported fasting glucose readings continue to be WNL. He continues to say the progam is helping him. He feels stronger and has more energy. He is doing his yard work without difficulty or fatigue. He is very pleased with his progress. Will continue to monitor.      Expected Outcomes  Patient will continue to attend sessions and complete the program meeting his personal goals.   Patient will continue to attend sessions and complete the program meeting his personal goals.   Patient will continue to attend sessions and complete the program meeting his personal goals.         Core Components/Risk Factors/Patient Goals at Discharge (Final Review):  Goals and Risk Factor Review - 01/09/18 1510      Core Components/Risk Factors/Patient Goals Review   Personal Goals Review  Weight Management/Obesity;Diabetes Lose 5-10 lbs; get heart healthy again.     Review  Patient has completed 27 sessions maintaining his weight. He continues to do well in the program with progression. No recent A1C lab. His reported fasting glucose readings continue to be WNL. He continues to say the progam is helping him. He feels stronger and has more energy. He is doing his yard work without  difficulty or fatigue. He is very pleased with his progress. Will continue to monitor.     Expected Outcomes  Patient will continue to attend sessions and complete the program meeting his personal goals.        ITP Comments: ITP Comments    Row Name 10/25/17 1453 10/26/17 1318         ITP Comments  Mr. Pasquarelli is a 62 year old patient with reocurring stent placement. 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 He did not come to CR at that time. He had another episode where he needed another stent. Patient is now ready to attend CR to regain is strength and regain heart health.   Patient new to program. Plans to start Monday 10/31/17.         Comments: ITP 30 Day REVIEW Patient doing well in the program. Will continue to monitor for progress.

## 2018-01-09 NOTE — Progress Notes (Signed)
Daily Session Note  Patient Details  Name: MAKYE RADLE MRN: [id number] Date of Birth: 01/07/1956 Referring Provider:     CARDIAC REHAB PHASE II ORIENTATION from 10/25/2017 in [place]  Referring Provider  Dr. Burt Knack      Encounter Date: 01/09/2018  Check In: Session Check In - 01/09/18 0956      Check-In   Location  [facility]    Staff Present  Suzanne Boron, BS, EP, Exercise Physiologist;Diane Coad, MS, EP, [facility], Exercise Physiologist    Supervising physician immediately available to respond to emergencies  See telemetry face sheet for immediately available MD    Medication changes reported      No    Fall or balance concerns reported     No    Warm-up and Cool-down  Performed as group-led instruction    Resistance Training Performed  Yes    VAD Patient?  No      Pain Assessment   Currently in Pain?  No/denies    Pain Score  0-No pain    Multiple Pain Sites  No       Capillary Blood Glucose: No results found for this or any previous visit (from the past 24 hour(s)).    Social History   Tobacco Use  Smoking Status Former Smoker  . Packs/day: 1.00  . Last attempt to quit: 03/25/2017  . Years since quitting: 0.7  Smokeless Tobacco Never Used    Goals Met:  Independence with exercise equipment Exercise tolerated well No report of cardiac concerns or symptoms Strength training completed today  Goals Unmet:  Not Applicable  Comments: Check out 1030   Dr. Kate Sable is Medical Director for [place] and Pulmonary Rehab.

## 2018-01-11 ENCOUNTER — Encounter (HOSPITAL_COMMUNITY): Payer: 59

## 2018-01-13 ENCOUNTER — Encounter (HOSPITAL_COMMUNITY)
Admission: RE | Admit: 2018-01-13 | Discharge: 2018-01-13 | Disposition: A | Discharge status: Home / Self Care | Payer: 59 | Source: Ambulatory Visit | Attending: Cardiovascular Disease | Admitting: Cardiovascular Disease

## 2018-01-13 DIAGNOSIS — Z955 Presence of coronary angioplasty implant and graft: Secondary | ICD-10-CM | POA: Diagnosis present

## 2018-01-13 DIAGNOSIS — Z48812 Encounter for surgical aftercare following surgery on the circulatory system: Secondary | ICD-10-CM | POA: Diagnosis not present

## 2018-01-13 NOTE — Progress Notes (Signed)
Daily Session Note  Patient Details  Name: [name] Foster MRN: [id number] Date of Birth: 05-Mar-1956 Referring Provider:     CARDIAC REHAB PHASE II ORIENTATION from 10/25/2017 in [place]  Referring Provider  Dr. Burt Knack      Encounter Date: 01/13/2018  Check In: Session Check In - 01/13/18 0958      Check-In   Location  [facility]    Staff Present  Russella Dar, MS, EP, [facility], Exercise Physiologist;Gregory Luther Parody, BS, EP, Exercise Physiologist    Supervising physician immediately available to respond to emergencies  See telemetry face sheet for immediately available MD    Medication changes reported      No    Fall or balance concerns reported     No    Tobacco Cessation  No Change    Warm-up and Cool-down  Performed as group-led instruction    Resistance Training Performed  Yes    VAD Patient?  No      Pain Assessment   Currently in Pain?  No/denies    Pain Score  0-No pain    Multiple Pain Sites  No       Capillary Blood Glucose: No results found for this or any previous visit (from the past 24 hour(s)).    Social History   Tobacco Use  Smoking Status Former Smoker  . Packs/day: 1.00  . Last attempt to quit: 03/25/2017  . Years since quitting: 0.8  Smokeless Tobacco Never Used    Goals Met:  Independence with exercise equipment Exercise tolerated well No report of cardiac concerns or symptoms Strength training completed today  Goals Unmet:  Not Applicable  Comments: Check out: 10:30   Dr. Kate Sable is Medical Director for [place] and Pulmonary Rehab.

## 2018-01-16 ENCOUNTER — Encounter (HOSPITAL_COMMUNITY)
Admission: RE | Admit: 2018-01-16 | Discharge: 2018-01-16 | Disposition: A | Discharge status: Home / Self Care | Payer: 59 | Source: Ambulatory Visit | Attending: Cardiovascular Disease | Admitting: Cardiovascular Disease

## 2018-01-16 DIAGNOSIS — Z48812 Encounter for surgical aftercare following surgery on the circulatory system: Secondary | ICD-10-CM | POA: Diagnosis not present

## 2018-01-16 DIAGNOSIS — Z955 Presence of coronary angioplasty implant and graft: Secondary | ICD-10-CM

## 2018-01-16 NOTE — Progress Notes (Signed)
Daily Session Note  Patient Details  Name: [name] Foster MRN: [id number] Date of Birth: 1956-01-02 Referring Provider:     CARDIAC REHAB PHASE II ORIENTATION from 10/25/2017 in [place]  Referring Provider  Dr. Burt Knack      Encounter Date: 01/16/2018  Check In: Session Check In - 01/16/18 1004      Check-In   Location  [facility]    Staff Present  Diane Angelina Pih, MS, EP, [facility], Exercise Physiologist;Daven Pinckney Luther Parody, BS, EP, Exercise Physiologist;Debra Wynetta Emery, RN, BSN    Supervising physician immediately available to respond to emergencies  See telemetry face sheet for immediately available MD    Medication changes reported      No    Fall or balance concerns reported     No    Warm-up and Cool-down  Performed as group-led instruction    Resistance Training Performed  Yes      Pain Assessment   Currently in Pain?  No/denies    Pain Score  0-No pain    Multiple Pain Sites  No       Capillary Blood Glucose: No results found for this or any previous visit (from the past 24 hour(s)).    Social History   Tobacco Use  Smoking Status Former Smoker  . Packs/day: 1.00  . Last attempt to quit: 03/25/2017  . Years since quitting: 0.8  Smokeless Tobacco Never Used    Goals Met:  Independence with exercise equipment Exercise tolerated well No report of cardiac concerns or symptoms Strength training completed today  Goals Unmet:  Not Applicable  Comments: Check out 1030   Dr. Kate Sable is Medical Director for [place] and Pulmonary Rehab.

## 2018-01-18 ENCOUNTER — Encounter (HOSPITAL_COMMUNITY): Payer: 59

## 2018-01-18 ENCOUNTER — Ambulatory Visit: Payer: 59 | Admitting: Cardiovascular Disease

## 2018-01-20 ENCOUNTER — Encounter (HOSPITAL_COMMUNITY): Payer: 59

## 2018-01-23 ENCOUNTER — Encounter (HOSPITAL_COMMUNITY)
Admission: RE | Admit: 2018-01-23 | Discharge: 2018-01-23 | Disposition: A | Discharge status: Home / Self Care | Payer: 59 | Source: Ambulatory Visit | Attending: Cardiovascular Disease | Admitting: Cardiovascular Disease

## 2018-01-23 DIAGNOSIS — Z48812 Encounter for surgical aftercare following surgery on the circulatory system: Secondary | ICD-10-CM | POA: Diagnosis not present

## 2018-01-23 DIAGNOSIS — Z955 Presence of coronary angioplasty implant and graft: Secondary | ICD-10-CM

## 2018-01-23 NOTE — Progress Notes (Signed)
Daily Session Note  Patient Details  Name: [name] Foster MRN: [id number] Date of Birth: 10-Jan-1956 Referring Provider:     CARDIAC REHAB PHASE II ORIENTATION from 10/25/2017 in [place]  Referring Provider  Dr. Burt Knack      Encounter Date: 01/23/2018  Check In:  Session Check In - 01/23/18 0935      Check-In   Location  [facility]    Staff Present  Diane Angelina Pih, MS, EP, [facility], Exercise Physiologist;Lundynn Cohoon Luther Parody, BS, EP, Exercise Physiologist;Debra Wynetta Emery, RN, BSN    Supervising physician immediately available to respond to emergencies  See telemetry face sheet for immediately available MD    Medication changes reported      No    Fall or balance concerns reported     No    Warm-up and Cool-down  Performed as group-led instruction    Resistance Training Performed  Yes    VAD Patient?  No      Pain Assessment   Currently in Pain?  No/denies    Pain Score  0-No pain    Multiple Pain Sites  No       Capillary Blood Glucose: No results found for this or any previous visit (from the past 24 hour(s)).    Social History   Tobacco Use  Smoking Status Former Smoker  . Packs/day: 1.00  . Last attempt to quit: 03/25/2017  . Years since quitting: 0.8  Smokeless Tobacco Never Used    Goals Met:  Independence with exercise equipment Exercise tolerated well No report of cardiac concerns or symptoms Strength training completed today  Goals Unmet:  Not Applicable  Comments: Check out 1030   Dr. Kate Sable is Medical Director for [place] and Pulmonary Rehab.

## 2018-01-25 ENCOUNTER — Encounter (HOSPITAL_COMMUNITY)
Admission: RE | Admit: 2018-01-25 | Discharge: 2018-01-25 | Disposition: A | Discharge status: Home / Self Care | Payer: 59 | Source: Ambulatory Visit | Attending: Cardiovascular Disease | Admitting: Cardiovascular Disease

## 2018-01-25 DIAGNOSIS — Z955 Presence of coronary angioplasty implant and graft: Secondary | ICD-10-CM

## 2018-01-25 DIAGNOSIS — Z48812 Encounter for surgical aftercare following surgery on the circulatory system: Secondary | ICD-10-CM | POA: Diagnosis not present

## 2018-01-25 NOTE — Progress Notes (Signed)
Daily Session Note  Patient Details  Name: [name] Foster MRN: [id number] Date of Birth: [birth year] Referring Provider:     CARDIAC REHAB PHASE II ORIENTATION from 10/25/2017 in [place]  Referring Provider  Dr. Burt Knack      Encounter Date: 01/25/2018  Check In: Session Check In - 01/25/18 0930      Check-In   Location  [facility]    Staff Present  Suzanne Boron, BS, EP, Exercise Physiologist;Juliah Scadden Wynetta Emery, RN, BSN    Supervising physician immediately available to respond to emergencies  See telemetry face sheet for immediately available MD    Medication changes reported      No    Fall or balance concerns reported     No    Warm-up and Cool-down  Performed as group-led instruction    Resistance Training Performed  Yes    VAD Patient?  No      Pain Assessment   Currently in Pain?  No/denies    Pain Score  0-No pain    Multiple Pain Sites  No       Capillary Blood Glucose: No results found for this or any previous visit (from the past 24 hour(s)).    Social History   Tobacco Use  Smoking Status Former Smoker  . Packs/day: 1.00  . Last attempt to quit: 03/25/2017  . Years since quitting: 0.8  Smokeless Tobacco Never Used    Goals Met:  Independence with exercise equipment Exercise tolerated well No report of cardiac concerns or symptoms Strength training completed today  Goals Unmet:  Not Applicable  Comments: Check out 1030.   Dr. Kate Sable is Medical Director for [facility] Cardiac and Pulmonary Rehab.

## 2018-01-27 ENCOUNTER — Encounter (HOSPITAL_COMMUNITY): Payer: 59

## 2018-01-27 NOTE — Progress Notes (Signed)
Cardiac Individual Treatment Plan  Patient Details  Name: [name] Foster MRN: [id number] Date of Birth: 1956/04/15 Referring Provider:     CARDIAC REHAB PHASE II ORIENTATION from 10/25/2017 in [facility]  Referring Provider  Dr. Burt Knack      Initial Encounter Date:    CARDIAC REHAB PHASE II ORIENTATION from 10/25/2017 in [facility]  Date  10/25/17  Referring Provider  Dr. Burt Knack      Visit Diagnosis: Status post coronary artery stent placement  Patient's Home Medications on Admission:  Current Outpatient Medications:  .  aspirin EC 81 MG EC tablet, Take 1 tablet (81 mg total) by mouth daily., Disp: 30 tablet, Rfl: 11 .  atorvastatin (LIPITOR) 80 MG tablet, Take 1 tablet (80 mg total) by mouth daily at 6 PM., Disp: 30 tablet, Rfl: 11 .  gabapentin (NEURONTIN) 300 MG capsule, Take 300 mg by mouth 3 (three) times daily as needed (NERVE PAIN). , Disp: , Rfl:  .  HYDROcodone-acetaminophen (NORCO) 7.5-325 MG tablet, Take 1 tablet by mouth every 6 (six) hours as needed for moderate pain., Disp: , Rfl:  .  isosorbide mononitrate (IMDUR) 30 MG 24 hr tablet, Take 0.5 tablets (15 mg total) by mouth daily., Disp: 15 tablet, Rfl: 11 .  losartan (COZAAR) 50 MG tablet, Take 1 tablet (50 mg total) by mouth daily., Disp: 30 tablet, Rfl: 11 .  metFORMIN (GLUCOPHAGE) 1000 MG tablet, Take 500 mg by mouth 2 (two) times daily with a meal., Disp: , Rfl: 6 .  metoprolol tartrate (LOPRESSOR) 50 MG tablet, Take 1 tablet (50 mg total) by mouth 2 (two) times daily., Disp: 180 tablet, Rfl: 3 .  nitroGLYCERIN (NITROSTAT) 0.4 MG SL tablet, Place 1 tablet (0.4 mg total) under the tongue every 5 (five) minutes x 3 doses as needed for chest pain., Disp: 25 tablet, Rfl: 11 .  ticagrelor (BRILINTA) 90 MG TABS tablet, Take 1 tablet (90 mg total) by mouth 2 (two) times daily., Disp: 60 tablet, Rfl: 0 .  zolpidem (AMBIEN) 10 MG tablet, Take 10 mg by mouth at bedtime as needed  for sleep. , Disp: , Rfl:   Past Medical History: Past Medical History:  Diagnosis Date  . CAD in native artery    a. 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 DES to left PDA 09/20/17  . Diabetes mellitus without complication ([place])   . High cholesterol   . Hyperlipidemia   . Hypertension   . Insomnia   . Pulmonary hypertension ([place])    a. TTE 03/25/17; EF 60-65%, nl WM, LV diastolic fxn nl, trivial AI, RV cavity size, wall thickness, and sys fxn nl, trivial TR, PASP 39 mmHg, trivial pericardial effusion  . Sleep apnea    could not use a cpap  . Wears dentures    full top-partial bottom  . Wears glasses     Tobacco Use: Social History   Tobacco Use  Smoking Status Former Smoker  . Packs/day: 1.00  . Last attempt to quit: 03/25/2017  . Years since quitting: 0.8  Smokeless Tobacco Never Used    Labs: Recent Review Scientist, physiological    Labs for ITP Cardiac and Pulmonary Rehab Latest Ref Rng & Units 03/25/2017 05/13/2017   Cholestrol 100 - 199 mg/dL 163 81(L)   LDLCALC 0 - 99 mg/dL 92 41   HDL >  39 mg/dL 25(L) 24(L)   Trlycerides 0 - 149 mg/dL 229(H) 82   Hemoglobin A1c 4.8 - 5.6 % 9.4(H) -   PHART 7.350 - 7.450 7.206(L) -   PCO2ART 32.0 - 48.0 mmHg 53.8(H) -   HCO3 20.0 - 28.0 mmol/L 21.3 -   TCO2 0 - 100 mmol/L 23 -   ACIDBASEDEF 0.0 - 2.0 mmol/L 7.0(H) -   O2SAT % 99.0 -      Capillary Blood Glucose: Lab Results  Component Value Date   GLUCAP 92 09/20/2017   GLUCAP 128 (H) 09/20/2017   GLUCAP 115 (H) 03/27/2017   GLUCAP 105 (H) 03/27/2017   GLUCAP 106 (H) 03/27/2017     Exercise Target Goals:    Exercise Program Goal: Individual exercise prescription set using results from initial 6 min walk test and THRR while considering  patient's activity barriers and safety.   Exercise Prescription Goal: Starting with aerobic activity 30 plus minutes  a day, 3 days per week for initial exercise prescription. Provide home exercise prescription and guidelines that participant acknowledges understanding prior to discharge.  Activity Barriers & Risk Stratification: Activity Barriers & Cardiac Risk Stratification - 10/25/17 1413      Activity Barriers & Cardiac Risk Stratification   Activity Barriers  None    Cardiac Risk Stratification  High       6 Minute Walk: 6 Minute Walk    Row Name 10/25/17 1412         6 Minute Walk   Phase  Initial     Distance  1550 feet     Distance % Change  0 %     Distance Feet Change  0 ft     Walk Time  6 minutes     # of Rest Breaks  0     MPH  2.93     METS  3.24     RPE  12     Perceived Dyspnea   13     VO2 Peak  14.79     Symptoms  No     Resting HR  60 bpm     Resting BP  172/86     Resting Oxygen Saturation   97 %     Exercise Oxygen Saturation  during 6 min walk  97 %     Max Ex. HR  90 bpm     Max Ex. BP  208/90     2 Minute Post BP  180/84        Oxygen Initial Assessment:   Oxygen Re-Evaluation:   Oxygen Discharge (Final Oxygen Re-Evaluation):   Initial Exercise Prescription: Initial Exercise Prescription - 10/25/17 1400      Date of Initial Exercise RX and Referring Provider   Date  10/25/17    Referring Provider  Dr. Burt Knack      Treadmill   MPH  2    Grade  0    Minutes  15    METs  2.5      Recumbant Elliptical   Level  1    RPM  45    Watts  58    Minutes  20    METs  3.2      Prescription Details   Frequency (times per week)  3    Duration  Progress to 30 minutes of continuous aerobic without signs/symptoms of physical distress      Intensity   THRR 40-80% of Max Heartrate  [phone]    Ratings  of Perceived Exertion  11-13    Perceived Dyspnea  0-4      Progression   Progression  Continue progressive overload as per policy without signs/symptoms or physical distress.      Resistance Training   Training Prescription  Yes    Weight  1     Reps  10-15       Perform Capillary Blood Glucose checks as needed.  Exercise Prescription Changes:  Exercise Prescription Changes    Row Name 11/02/17 0900 11/10/17 1500 11/29/17 0800 12/07/17 1400 01/03/18 1000     Response to Exercise   Blood Pressure (Admit)  -  170/100  150/72  148/70  156/78   Blood Pressure (Exercise)  -  172/70  158/80  170/72  160/76   Blood Pressure (Exit)  -  150/66  144/72  140/74  144/74   Heart Rate (Admit)  -  74 bpm  54 bpm  64 bpm  50 bpm   Heart Rate (Exercise)  -  104 bpm  70 bpm  96 bpm  89 bpm   Heart Rate (Exit)  -  77 bpm  63 bpm  72 bpm  59 bpm   Rating of Perceived Exertion (Exercise)  -  '11  11  9  10   '$ Duration  -  Progress to 30 minutes of  aerobic without signs/symptoms of physical distress  Progress to 30 minutes of  aerobic without signs/symptoms of physical distress  Progress to 30 minutes of  aerobic without signs/symptoms of physical distress  Progress to 30 minutes of  aerobic without signs/symptoms of physical distress   Intensity  -  THRR New [phone]  THRR New [phone]  THRR New 102-120-139  THRR New [phone]     Progression   Progression  -  Continue to progress workloads to maintain intensity without signs/symptoms of physical distress.  Continue to progress workloads to maintain intensity without signs/symptoms of physical distress.  Continue to progress workloads to maintain intensity without signs/symptoms of physical distress.  Continue to progress workloads to maintain intensity without signs/symptoms of physical distress.     Resistance Training   Training Prescription  Yes  Yes  Yes  Yes  Yes   Weight  '1  1  3  4  4   '$ Reps  10-15  10-15  10-15  10-15  10-15     Treadmill   MPH  2  2  2.2  2.3  2.7   Grade  0  0  0  0  0   Minutes  '15  15  15  15  15   '$ METs  2.5  2.5  2.6  2.7  3     Recumbant Elliptical   Level  '1  1  2  2  3   '$ RPM  45  62  74  73  60   Watts  58  80  105  112  86   Minutes  '20  20  20  20   20   '$ METs  3.2  4.5  5.7  6.2  4.8     Home Exercise Plan   Plans to continue exercise at  Home (comment)  Home (comment)  Home (comment)  Home (comment)  Home (comment)   Frequency  Add 2 additional days to program exercise sessions.  Add 2 additional days to program exercise sessions.  Add 2 additional days to program exercise sessions.  Add 2 additional days  to program exercise sessions.  Add 2 additional days to program exercise sessions.   Initial Home Exercises Provided  11/02/17  11/02/17  11/02/17  11/02/17  11/02/17   Row Name 01/16/18 1500 01/27/18 1400           Response to Exercise   Blood Pressure (Admit)  150/78  136/68      Blood Pressure (Exercise)  130/84  166/80      Blood Pressure (Exit)  140/78  140/80      Heart Rate (Admit)  72 bpm  60 bpm      Heart Rate (Exercise)  92 bpm  89 bpm      Heart Rate (Exit)  82 bpm  61 bpm      Rating of Perceived Exertion (Exercise)  9  10      Duration  Progress to 30 minutes of  aerobic without signs/symptoms of physical distress  Progress to 30 minutes of  aerobic without signs/symptoms of physical distress      Intensity  THRR New [phone]  THRR New [phone]        Progression   Progression  Continue to progress workloads to maintain intensity without signs/symptoms of physical distress.  Continue to progress workloads to maintain intensity without signs/symptoms of physical distress.        Resistance Training   Training Prescription  Yes  Yes      Weight  4  4      Reps  10-15  10-15        Treadmill   MPH  2.8  2.8      Grade  0  0      Minutes  15  15      METs  3.1  3.1        Recumbant Elliptical   Level  3  3      RPM  75  68      Watts  112  97      Minutes  20  20      METs  6.3  5.5        Home Exercise Plan   Plans to continue exercise at  Home (comment)  Home (comment)      Frequency  Add 2 additional days to program exercise sessions.  Add 2 additional days to program exercise sessions.       Initial Home Exercises Provided  11/02/17  11/02/17         Exercise Comments:  Exercise Comments    Row Name 11/02/17 6789 11/10/17 1532 11/29/17 0821 12/07/17 1444 01/03/18 1019   Exercise Comments  Patient received the take home exercise plan today 11/02/2017. THR was addressed as were safety guidelines for being active when not in CR. Patient demonstrated an understanding and was encouraged to ask any future questions as they arise.   Patient is doing very well in CR and has maintained his speed on the treadmill and has increased his watts and RPMs on the recumbent elliptical. Patient has only recently started the program and has not been progressed much due to his abnormally high BP. His physicians have been contacted and he will be progressed in time throughout the remainder of the prorgam.   Patient is doing well in Cr and has progressed on both the treadmill as well as the recumbent elliptical. patients blood pressure has also decreased. Patient will continue to grow stronger and will be monitored throughout the remainder of the program.  Goals will be met.   Patient is doing very well in CR and has maintained his speed on the treadmill and has increased his watts and RPMs on the recumbent elliptical. Patient has stated to me that he has much more energy than before and is able to do much more when not here in CR and his stamina has also increased. His BP continues to be monitored.  Patient continues to do well in CR. Patient has increased his treadmill speed to 2.7 and has also increased his recumbent elliptical to level 3. Patient has stated to me that he feels much stronger since beginning the program and that he feels more stamina as well. Patient seems to have a better graspe on his blood glucose level as well as seen in his levels for each class.    [place] Name 01/16/18 1514 01/27/18 1447         Exercise Comments  Patient continues to do well in CR. Patient has increased his speed on the  treadmill to 2.8 and he has also increased his RPMs and Watts on the recumbent elliptical as well. Patient has not yet lost the weight that he wanted to lose but he has gotten stronger and has increased stamina.   Patient continues to do well in CR. Patient has maintained his speed and levels on both of his machines while working with 4lbs dumbbells for his warm up. Patient stated to me that he feels much better and that he has been eating better and has gained much more endurance from the program but unfortunatley has not had much weight loss. The endurance and stamina has been increased.          Exercise Goals and Review:  Exercise Goals    Row Name 10/25/17 1413             Exercise Goals   Increase Physical Activity  Yes       Intervention  Provide advice, education, support and counseling about physical activity/exercise needs.;Develop an individualized exercise prescription for aerobic and resistive training based on initial evaluation findings, risk stratification, comorbidities and participant's personal goals.       Expected Outcomes  Short Term: Attend rehab on a regular basis to increase amount of physical activity.       Increase Strength and Stamina  Yes       Intervention  Provide advice, education, support and counseling about physical activity/exercise needs.;Develop an individualized exercise prescription for aerobic and resistive training based on initial evaluation findings, risk stratification, comorbidities and participant's personal goals.       Expected Outcomes  Short Term: Increase workloads from initial exercise prescription for resistance, speed, and METs.       Able to understand and use rate of perceived exertion (RPE) scale  Yes       Intervention  Provide education and explanation on how to use RPE scale       Expected Outcomes  Short Term: Able to use RPE daily in rehab to express subjective intensity level;Long Term:  Able to use RPE to guide intensity level  when exercising independently       Able to understand and use Dyspnea scale  Yes       Intervention  Provide education and explanation on how to use Dyspnea scale       Expected Outcomes  Long Term: Able to use Dyspnea scale to guide intensity level when exercising independently;Short Term: Able to use Dyspnea scale daily in  rehab to express subjective sense of shortness of breath during exertion       Knowledge and understanding of Target Heart Rate Range (THRR)  Yes       Intervention  Provide education and explanation of THRR including how the numbers were predicted and where they are located for reference       Expected Outcomes  Short Term: Able to state/look up THRR;Short Term: Able to use daily as guideline for intensity in rehab;Long Term: Able to use THRR to govern intensity when exercising independently       Able to check pulse independently  Yes       Intervention  Review the importance of being able to check your own pulse for safety during independent exercise;Provide education and demonstration on how to check pulse in carotid and radial arteries.       Expected Outcomes  Short Term: Able to explain why pulse checking is important during independent exercise;Long Term: Able to check pulse independently and accurately       Understanding of Exercise Prescription  Yes       Intervention  Provide education, explanation, and written materials on patient's individual exercise prescription       Expected Outcomes  Long Term: Able to explain home exercise prescription to exercise independently;Short Term: Able to explain program exercise prescription          Exercise Goals Re-Evaluation : Exercise Goals Re-Evaluation    Row Name 11/10/17 1530 12/07/17 1443 01/06/18 1419 01/27/18 1445       Exercise Goal Re-Evaluation   Exercise Goals Review  Increase Physical Activity;Able to understand and use rate of perceived exertion (RPE) scale;Knowledge and understanding of Target Heart Rate  Range (THRR);Able to understand and use Dyspnea scale;Understanding of Exercise Prescription  Increase Physical Activity;Able to understand and use rate of perceived exertion (RPE) scale;Knowledge and understanding of Target Heart Rate Range (THRR);Able to understand and use Dyspnea scale;Understanding of Exercise Prescription  Increase Physical Activity;Able to understand and use rate of perceived exertion (RPE) scale;Knowledge and understanding of Target Heart Rate Range (THRR);Able to understand and use Dyspnea scale;Understanding of Exercise Prescription  Increase Physical Activity;Able to understand and use rate of perceived exertion (RPE) scale;Knowledge and understanding of Target Heart Rate Range (THRR);Able to understand and use Dyspnea scale;Understanding of Exercise Prescription    Comments  Patient is doing very well in CR and has maintained his speed on the treadmill and has increased his watts and RPMs on the recumbent elliptical. Patient has only recently started the program and has not been progressed much due to his abnormally high BP. His physicians have been contacted and he will be progressed in time throughout the remainder of the prorgam.   Patient is doing very well in CR and has maintained his speed on the treadmill and has increased his watts and RPMs on the recumbent elliptical. Patient has stated to me that he has much more energy than before and is able to do much more when not here in CR and his stamina has also increased. His BP continues to be monitored.   Patienty has been doing very well in CR and has been progressing on both of his machines, the treadmill and the recumbent elliptical. Patient has shown strength increase through weights in the warm up portion of each class. Patients BP has not been as high as previously   Patient continues to do well in CR. Patient has maintained his speed and levels on both of  his machines while working with 4lbs dumbbells for his warm up. Patient  stated to me that he feels much better and that he has been eating better and has gained much more endurance from the program but unfortunatley has not had much weight loss. The endurance and stamina has been increased.     Expected Outcomes  Patient wishes to lose weight and to become heart healthy again.   Patient wishes to lose weight and to become heart healthy again.   Patient wishes to lose weight and to become heart healthy again.   Patient wishes to lose weight and to become heart healthy again.         Discharge Exercise Prescription (Final Exercise Prescription Changes): Exercise Prescription Changes - 01/27/18 1400      Response to Exercise   Blood Pressure (Admit)  136/68    Blood Pressure (Exercise)  166/80    Blood Pressure (Exit)  140/80    Heart Rate (Admit)  60 bpm    Heart Rate (Exercise)  89 bpm    Heart Rate (Exit)  61 bpm    Rating of Perceived Exertion (Exercise)  10    Duration  Progress to 30 minutes of  aerobic without signs/symptoms of physical distress    Intensity  THRR New [phone]      Progression   Progression  Continue to progress workloads to maintain intensity without signs/symptoms of physical distress.      Resistance Training   Training Prescription  Yes    Weight  4    Reps  10-15      Treadmill   MPH  2.8    Grade  0    Minutes  15    METs  3.1      Recumbant Elliptical   Level  3    RPM  68    Watts  97    Minutes  20    METs  5.5      Home Exercise Plan   Plans to continue exercise at  Home (comment)    Frequency  Add 2 additional days to program exercise sessions.    Initial Home Exercises Provided  11/02/17       Nutrition:  Target Goals: Understanding of nutrition guidelines, daily intake of sodium '1500mg'$ , cholesterol '200mg'$ , calories 30% from fat and 7% or less from saturated fats, daily to have 5 or more servings of fruits and vegetables.  Biometrics: Pre Biometrics - 10/25/17 1414      Pre Biometrics   Height  5'  8" (1.727 m)    Weight  179 lb 3.2 oz (81.3 kg)    Waist Circumference  39 inches    Hip Circumference  37 inches    Waist to Hip Ratio  1.05 %    BMI (Calculated)  27.25    Triceps Skinfold  7 mm    % Body Fat  23.36 %    Grip Strength  79.73 kg    Flexibility  14.83 in    Single Leg Stand  60 seconds        Nutrition Therapy Plan and Nutrition Goals: Nutrition Therapy & Goals - 11/17/17 1413      Personal Nutrition Goals   Nutrition Goal  For heart healthy choices add >50% of whole grains, make half their plate fruits and vegetables. Discuss the difference between starchy vegetables and leafy greens, and how leafy vegetables provide fiber, helps maintain healthy weight, helps control blood glucose, and lowers  cholesterol.  Discuss purchasing fresh or frozen vegetable to reduce sodium and not to add grease, fat or sugar. Consume <18oz of red meat per week. Consume lean cuts of meats and very little of meats high in sodium and nitrates such as pork and lunch meats. Discussed portion control for all food groups.        Intervention Plan   Intervention  Nutrition handout(s) given to patient.    Expected Outcomes  Short Term Goal: Understand basic principles of dietary content, such as calories, fat, sodium, cholesterol and nutrients.       Nutrition Assessments: Nutrition Assessments - 10/25/17 1501      MEDFICTS Scores   Pre Score  27       Nutrition Goals Re-Evaluation: Nutrition Goals Re-Evaluation    Row Name 12/12/17 1444 01/09/18 1508 01/27/18 1455         Goals   Current Weight  179 lb (81.2 kg)  180 lb 1.6 oz (81.7 kg)  180 lb 9.6 oz (81.9 kg)     Nutrition Goal  For heart healthy choices add >50% of whole grains, make half their plate fruits and vegetables. Discuss the difference between starchy vegetables and leafy greens, and how leafy vegetables provide fiber, helps maintain healthy weight, helps control blood glucose, and lowers cholesterol.  Discuss purchasing  fresh or frozen vegetable to reduce sodium and not to add grease, fat or sugar. Consume <18oz of red meat per week. Consume lean cuts of meats and very little of meats high in sodium and nitrates such as pork and lunch meats. Discussed portion control for all food groups.    For heart healthy choices add >50% of whole grains, make half their plate fruits and vegetables. Discuss the difference between starchy vegetables and leafy greens, and how leafy vegetables provide fiber, helps maintain healthy weight, helps control blood glucose, and lowers cholesterol.  Discuss purchasing fresh or frozen vegetable to reduce sodium and not to add grease, fat or sugar. Consume <18oz of red meat per week. Consume lean cuts of meats and very little of meats high in sodium and nitrates such as pork and lunch meats. Discussed portion control for all food groups.    For heart healthy choices add >50% of whole grains, make half their plate fruits and vegetables. Discuss the difference between starchy vegetables and leafy greens, and how leafy vegetables provide fiber, helps maintain healthy weight, helps control blood glucose, and lowers cholesterol.  Discuss purchasing fresh or frozen vegetable to reduce sodium and not to add grease, fat or sugar. Consume <18oz of red meat per week. Consume lean cuts of meats and very little of meats high in sodium and nitrates such as pork and lunch meats. Discussed portion control for all food groups.       Comment  Patient has maintained his weight. He continues to eat a diabetic diet and trys to make healthy choices.   Patient has gained 1 lb since last 30 day review. He continues to eat a diabetic diet and trys to make healthly choices. Will continue to monitor.   Patient has maintained his weight since last 30 day review. He continues to eat a diabetic diet and trys to make healthly choices. Will continue to monitor.      Expected Outcome  -  Patient will continue to meet his nutrition goals.    Patient will continue to meet his nutrition goals.        Personal Goal #2 Re-Evaluation  Personal Goal #2  Patient is eating a diabetic diet anlong with making heart healthy choices.   Patient is eating a diabetic diet anlong with making heart healthy choices.   Patient is eating a diabetic diet anlong with making heart healthy choices.         Nutrition Goals Discharge (Final Nutrition Goals Re-Evaluation): Nutrition Goals Re-Evaluation - 01/27/18 1455      Goals   Current Weight  180 lb 9.6 oz (81.9 kg)    Nutrition Goal  For heart healthy choices add >50% of whole grains, make half their plate fruits and vegetables. Discuss the difference between starchy vegetables and leafy greens, and how leafy vegetables provide fiber, helps maintain healthy weight, helps control blood glucose, and lowers cholesterol.  Discuss purchasing fresh or frozen vegetable to reduce sodium and not to add grease, fat or sugar. Consume <18oz of red meat per week. Consume lean cuts of meats and very little of meats high in sodium and nitrates such as pork and lunch meats. Discussed portion control for all food groups.      Comment  Patient has maintained his weight since last 30 day review. He continues to eat a diabetic diet and trys to make healthly choices. Will continue to monitor.     Expected Outcome  Patient will continue to meet his nutrition goals.       Personal Goal #2 Re-Evaluation   Personal Goal #2  Patient is eating a diabetic diet anlong with making heart healthy choices.        Psychosocial: Target Goals: Acknowledge presence or absence of significant depression and/or stress, maximize coping skills, provide positive support system. Participant is able to verbalize types and ability to use techniques and skills needed for reducing stress and depression.  Initial Review & Psychosocial Screening: Initial Psych Review & Screening - 10/25/17 1506      Initial Review   Current issues with  None  Identified    Source of Stress Concerns  None Identified      Family Dynamics   Good Support System?  Yes      Barriers   Psychosocial barriers to participate in program  There are no identifiable barriers or psychosocial needs.      Screening Interventions   Interventions  Encouraged to exercise    Expected Outcomes  Short Term goal: Identification and review with participant of any Quality of Life or Depression concerns found by scoring the questionnaire.;Long Term goal: The participant improves quality of Life and PHQ9 Scores as seen by post scores and/or verbalization of changes       Quality of Life Scores: Quality of Life - 10/25/17 1415      Quality of Life Scores   Health/Function Pre  20.4 %    Socioeconomic Pre  26.86 %    Psych/Spiritual Pre  23.79 %    Family Pre  28.8 %    GLOBAL Pre  23.66 %      Scores of 19 and below usually indicate a poorer quality of life in these areas.  A difference of  2-3 points is a clinically meaningful difference.  A difference of 2-3 points in the total score of the Quality of Life Index has been associated with significant improvement in overall quality of life, self-image, physical symptoms, and general health in studies assessing change in quality of life.  PHQ-9: Recent Review Flowsheet Data    Depression screen [facility] 2/9 10/25/2017 04/26/2017   Decreased Interest 0  0   Down, Depressed, Hopeless 0 0   PHQ - 2 Score 0 0   Altered sleeping 1 -   Tired, decreased energy 1 -   Change in appetite 0 -   Feeling bad or failure about yourself  0 -   Trouble concentrating 0 -   Moving slowly or fidgety/restless 0 -   Suicidal thoughts 0 -   PHQ-9 Score 2 -   Difficult doing work/chores Not difficult at all -     Interpretation of Total Score  Total Score Depression Severity:  1-4 = Minimal depression, 5-9 = Mild depression, 10-14 = Moderate depression, 15-19 = Moderately severe depression, 20-27 = Severe depression   Psychosocial  Evaluation and Intervention: Psychosocial Evaluation - 10/25/17 1507      Psychosocial Evaluation & Interventions   Interventions  Encouraged to exercise with the program and follow exercise prescription    Continue Psychosocial Services   No Follow up required       Psychosocial Re-Evaluation: Psychosocial Re-Evaluation    Stacy Name 11/14/17 1443 12/12/17 1455 01/09/18 1512 01/27/18 1459       Psychosocial Re-Evaluation   Current issues with  None Identified  None Identified  None Identified  None Identified    Comments  Patient's initial QOL score was 23.66 and her PHQ-9 was 2. No issues identified.   Patient's initial QOL score was 23.66 and her PHQ-9 was 2. No issues identified.   Patient's initial QOL score was 23.66 and her PHQ-9 was 2. No issues identified.   Patient's initial QOL score was 23.66 and her PHQ-9 was 2. No issues identified.     Expected Outcomes  Patient will have no psychosocial issues identified at discharge.   Patient will have no psychosocial issues identified at discharge.   Patient will have no psychosocial issues identified at discharge.   Patient will have no psychosocial issues identified at discharge.     Interventions  Stress management education;Encouraged to attend Cardiac Rehabilitation for the exercise;Relaxation education  Stress management education;Encouraged to attend Cardiac Rehabilitation for the exercise;Relaxation education  Stress management education;Encouraged to attend Cardiac Rehabilitation for the exercise;Relaxation education  Stress management education;Encouraged to attend Cardiac Rehabilitation for the exercise;Relaxation education    Continue Psychosocial Services   No Follow up required  No Follow up required  No Follow up required  No Follow up required       Psychosocial Discharge (Final Psychosocial Re-Evaluation): Psychosocial Re-Evaluation - 01/27/18 1459      Psychosocial Re-Evaluation   Current issues with  None Identified     Comments  Patient's initial QOL score was 23.66 and her PHQ-9 was 2. No issues identified.     Expected Outcomes  Patient will have no psychosocial issues identified at discharge.     Interventions  Stress management education;Encouraged to attend Cardiac Rehabilitation for the exercise;Relaxation education    Continue Psychosocial Services   No Follow up required       Vocational Rehabilitation: Provide vocational rehab assistance to qualifying candidates.   Vocational Rehab Evaluation & Intervention: Vocational Rehab - 10/25/17 1451      Initial Vocational Rehab Evaluation & Intervention   Assessment shows need for Vocational Rehabilitation  No       Education: Education Goals: Education classes will be provided on a weekly basis, covering required topics. Participant will state understanding/return demonstration of topics presented.  Learning Barriers/Preferences: Learning Barriers/Preferences - 10/25/17 1448      Learning Barriers/Preferences   Learning  Barriers  None    Learning Preferences  Skilled Demonstration;Group Instruction;Verbal Instruction       Education Topics: Hypertension, Hypertension Reduction -Define heart disease and high blood pressure. Discus how high blood pressure affects the body and ways to reduce high blood pressure.   Exercise and Your Heart -Discuss why it is important to exercise, the FITT principles of exercise, normal and abnormal responses to exercise, and how to exercise safely.   CARDIAC REHAB PHASE II EXERCISE from 01/25/2018 in [place]  Date  01/25/18  Educator  DC  Instruction Review Code  2- Demonstrated Understanding      Angina -Discuss definition of angina, causes of angina, treatment of angina, and how to decrease risk of having angina.   CARDIAC REHAB PHASE II EXERCISE from 01/25/2018 in [place]  Date  11/02/17  Educator  DJ  Instruction Review Code  2- Demonstrated  Understanding      Cardiac Medications -Review what the following cardiac medications are used for, how they affect the body, and side effects that may occur when taking the medications.  Medications include Aspirin, Beta blockers, calcium channel blockers, ACE Inhibitors, angiotensin receptor blockers, diuretics, digoxin, and antihyperlipidemics.   CARDIAC REHAB PHASE II EXERCISE from 01/25/2018 in [place]  Date  11/09/17  Educator  DC  Instruction Review Code  2- Demonstrated Understanding      Congestive Heart Failure -Discuss the definition of CHF, how to live with CHF, the signs and symptoms of CHF, and how keep track of weight and sodium intake.   CARDIAC REHAB PHASE II EXERCISE from 01/25/2018 in [place]  Date  11/16/17  Educator  DC  Instruction Review Code  2- Demonstrated Understanding      Heart Disease and Intimacy -Discus the effect sexual activity has on the heart, how changes occur during intimacy as we age, and safety during sexual activity.   Smoking Cessation / COPD -Discuss different methods to quit smoking, the health benefits of quitting smoking, and the definition of COPD.   CARDIAC REHAB PHASE II EXERCISE from 01/25/2018 in [place]  Date  12/01/17  Educator  DC  Instruction Review Code  2- Demonstrated Understanding      Nutrition I: Fats -Discuss the types of cholesterol, what cholesterol does to the heart, and how cholesterol levels can be controlled.   CARDIAC REHAB PHASE II EXERCISE from 01/25/2018 in [place]  Date  12/07/17  Educator  DC  Instruction Review Code  2- Demonstrated Understanding      Nutrition II: Labels -Discuss the different components of food labels and how to read food label   CARDIAC REHAB PHASE II EXERCISE from 01/25/2018 in [place]  Date  12/14/17  Educator  DJ  Instruction Review Code  2-  Demonstrated Understanding      Heart Parts/Heart Disease and PAD -Discuss the anatomy of the heart, the pathway of blood circulation through the heart, and these are affected by heart disease.   CARDIAC REHAB PHASE II EXERCISE from 01/25/2018 in [place]  Date  12/21/17  Educator  DJ  Instruction Review Code  2- Demonstrated Understanding      Stress I: Signs and Symptoms -Discuss the causes of stress, how stress may lead to anxiety and depression, and ways to limit stress.   Stress II: Relaxation -Discuss different types of relaxation techniques to limit stress.   Warning  Signs of Stroke / TIA -Discuss definition of a stroke, what the signs and symptoms are of a stroke, and how to identify when someone is having stroke.   Knowledge Questionnaire Score: Knowledge Questionnaire Score - 10/25/17 1449      Knowledge Questionnaire Score   Pre Score  21/24       Core Components/Risk Factors/Patient Goals at Admission: Personal Goals and Risk Factors at Admission - 10/25/17 1502      Core Components/Risk Factors/Patient Goals on Admission   Intervention  Weight Management: Provide education and appropriate resources to help participant work on and attain dietary goals.    Admit Weight  179 lb 3.2 oz (81.3 kg)    Goal Weight: Short Term  174 lb 3.2 oz (79 kg)    Goal Weight: Long Term  169 lb 3.2 oz (76.7 kg)    Expected Outcomes  Short Term: Continue to assess and modify interventions until short term weight is achieved;Long Term: Adherence to nutrition and physical activity/exercise program aimed toward attainment of established weight goal    Diabetes  -- Has diabetes under control.    Personal Goal Other  Yes    Personal Goal  Regain heart health, lose 5-10 pounds.     Intervention  Attend CR 3 x week and supplement with home exercise 2 x week.     Expected Outcomes  Achieve personal goals.        Core Components/Risk Factors/Patient Goals  Review:  Goals and Risk Factor Review    Row Name 11/14/17 1440 12/12/17 1446 01/09/18 1510 01/27/18 1456       Core Components/Risk Factors/Patient Goals Review   Personal Goals Review  Weight Management/Obesity;Diabetes Lose 5-10 lbs; get heart healthy again.  Weight Management/Obesity;Diabetes Lose 5-10 lbs; get heart healthy again.  Weight Management/Obesity;Diabetes Lose 5-10 lbs; get heart healthy again.   Weight Management/Obesity;Diabetes Lose 5-10 lbs; get heart healthy again.    Review  Patient has completed 8 sessions losing 1 lbs. He last A1C was 6 a few months ago. His reported glucose readings have improved since he started after he changed what he was eating for breakfast per our advice. He is progressing on the excercise equipment. Will continue to monitor for progress.   Patient has completed 19 maintaining his weight. He is doing well in the program with progression. His last A1C was 6 a few months ago. His reported fasting glucose readings are WNL. He says the program has helped him immensely. He feels a lot stronger and has more energy now to do more things. Will continue to monitor for progress.   Patient has completed 27 sessions maintaining his weight. He continues to do well in the program with progression. No recent A1C lab. His reported fasting glucose readings continue to be WNL. He continues to say the progam is helping him. He feels stronger and has more energy. He is doing his yard work without difficulty or fatigue. He is very pleased with his progress. Will continue to monitor.   Patient has completed 31 sessions maintaining his weight. He continues to do well in the program with progression. No recent A1C labs but his reported fasting glucose readings continue to show diabetic controll. He says the program has helped him alot. He feels better both physically and mentally. He feels stronger and his energy continues to increase. He is able to all ADL's without difficulty. Will  continue to monitor for progress.     Expected Outcomes  Patient will  continue to attend sessions and complete the program meeting his personal goals.   Patient will continue to attend sessions and complete the program meeting his personal goals.   Patient will continue to attend sessions and complete the program meeting his personal goals.   Patient will continue to attend sessions and complete the program meeting his personal goals.        Core Components/Risk Factors/Patient Goals at Discharge (Final Review):  Goals and Risk Factor Review - 01/27/18 1456      Core Components/Risk Factors/Patient Goals Review   Personal Goals Review  Weight Management/Obesity;Diabetes Lose 5-10 lbs; get heart healthy again.    Review  Patient has completed 31 sessions maintaining his weight. He continues to do well in the program with progression. No recent A1C labs but his reported fasting glucose readings continue to show diabetic controll. He says the program has helped him alot. He feels better both physically and mentally. He feels stronger and his energy continues to increase. He is able to all ADL's without difficulty. Will continue to monitor for progress.     Expected Outcomes  Patient will continue to attend sessions and complete the program meeting his personal goals.        ITP Comments: ITP Comments    Row Name 10/25/17 1453 10/26/17 1318         ITP Comments  Mr. Gabrielsen is a 62 year old patient with reocurring stent placement. 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 He did not come to CR at that time. He had another episode where he needed another stent. Patient is now ready to attend CR to regain is strength and regain heart health.   Patient new to program. Plans to start Monday 10/31/17.         Comments: ITP 30 Day REVIEW Patient doing well in the program. Will continue to monitor for progress.

## 2018-01-30 ENCOUNTER — Encounter (HOSPITAL_COMMUNITY): Payer: 59

## 2018-02-01 ENCOUNTER — Encounter (HOSPITAL_COMMUNITY): Payer: 59

## 2018-02-03 ENCOUNTER — Encounter (HOSPITAL_COMMUNITY)
Admission: RE | Admit: 2018-02-03 | Discharge: 2018-02-03 | Disposition: A | Discharge status: Home / Self Care | Payer: 59 | Source: Ambulatory Visit | Attending: Cardiovascular Disease | Admitting: Cardiovascular Disease

## 2018-02-03 DIAGNOSIS — Z955 Presence of coronary angioplasty implant and graft: Secondary | ICD-10-CM

## 2018-02-03 DIAGNOSIS — Z48812 Encounter for surgical aftercare following surgery on the circulatory system: Secondary | ICD-10-CM | POA: Diagnosis not present

## 2018-02-03 NOTE — Progress Notes (Signed)
Daily Session Note  Patient Details  Name: [name] Foster MRN: [id number] Date of Birth: [birth year] Referring Provider:     CARDIAC REHAB PHASE II ORIENTATION from 10/25/2017 in [place]  Referring Provider  Dr. Burt Knack      Encounter Date: 02/03/2018  Check In: Session Check In - 02/03/18 0930      Check-In   Location  [facility]    Staff Present  Suzanne Boron, BS, EP, Exercise Physiologist;Jakerra Floyd Wynetta Emery, RN, BSN;Diane Coad, MS, EP, [facility], Exercise Physiologist    Supervising physician immediately available to respond to emergencies  See telemetry face sheet for immediately available MD    Medication changes reported      No    Fall or balance concerns reported     No    Warm-up and Cool-down  Performed as group-led instruction    Resistance Training Performed  Yes    VAD Patient?  No      Pain Assessment   Currently in Pain?  No/denies    Pain Score  0-No pain    Multiple Pain Sites  No       Capillary Blood Glucose: No results found for this or any previous visit (from the past 24 hour(s)).    Social History   Tobacco Use  Smoking Status Former Smoker  . Packs/day: 1.00  . Last attempt to quit: 03/25/2017  . Years since quitting: 0.8  Smokeless Tobacco Never Used    Goals Met:  Independence with exercise equipment Exercise tolerated well No report of cardiac concerns or symptoms Strength training completed today  Goals Unmet:  Not Applicable  Comments: Check out 1030.   Dr. Kate Sable is Medical Director for [facility] Cardiac and Pulmonary Rehab.

## 2018-02-06 ENCOUNTER — Encounter (HOSPITAL_COMMUNITY): Payer: 59

## 2018-02-08 ENCOUNTER — Encounter (HOSPITAL_COMMUNITY)
Admission: RE | Admit: 2018-02-08 | Discharge: 2018-02-08 | Disposition: A | Discharge status: Home / Self Care | Payer: 59 | Source: Ambulatory Visit | Attending: Cardiovascular Disease | Admitting: Cardiovascular Disease

## 2018-02-08 DIAGNOSIS — Z48812 Encounter for surgical aftercare following surgery on the circulatory system: Secondary | ICD-10-CM | POA: Diagnosis not present

## 2018-02-08 NOTE — Progress Notes (Signed)
Daily Session Note  Patient Details  Name: [name] Foster MRN: [id number] Date of Birth: 1955/10/18 Referring Provider:     CARDIAC REHAB PHASE II ORIENTATION from 10/25/2017 in [place]  Referring Provider  Dr. Burt Knack      Encounter Date: 02/08/2018  Check In: Session Check In - 02/08/18 1030      Check-In   Location  [facility]    Staff Present  Kelan Pritt Angelina Pih, MS, EP, [facility], Exercise Physiologist;Gregory Luther Parody, BS, EP, Exercise Physiologist    Supervising physician immediately available to respond to emergencies  See telemetry face sheet for immediately available MD    Medication changes reported      No    Fall or balance concerns reported     No    Tobacco Cessation  No Change    Warm-up and Cool-down  Performed as group-led instruction    Resistance Training Performed  Yes    VAD Patient?  No      Pain Assessment   Currently in Pain?  Other (Comment)    Pain Score  0-No pain    Multiple Pain Sites  No       Capillary Blood Glucose: No results found for this or any previous visit (from the past 24 hour(s)).    Social History   Tobacco Use  Smoking Status Former Smoker  . Packs/day: 1.00  . Last attempt to quit: 03/25/2017  . Years since quitting: 0.8  Smokeless Tobacco Never Used    Goals Met:  Independence with exercise equipment Exercise tolerated well No report of cardiac concerns or symptoms Strength training completed today  Goals Unmet:  Not Applicable  Comments: Check out: 12:00   Dr. Kate Sable is Medical Director for [place] and Pulmonary Rehab.

## 2018-02-10 ENCOUNTER — Encounter (HOSPITAL_COMMUNITY)
Admission: RE | Admit: 2018-02-10 | Discharge: 2018-02-10 | Disposition: A | Discharge status: Home / Self Care | Payer: 59 | Source: Ambulatory Visit | Attending: Cardiovascular Disease | Admitting: Cardiovascular Disease

## 2018-02-10 DIAGNOSIS — Z955 Presence of coronary angioplasty implant and graft: Secondary | ICD-10-CM

## 2018-02-10 DIAGNOSIS — Z48812 Encounter for surgical aftercare following surgery on the circulatory system: Secondary | ICD-10-CM | POA: Diagnosis not present

## 2018-02-10 NOTE — Progress Notes (Signed)
Daily Session Note  Patient Details  Name: ASHER TORPEY MRN: [id number] Date of Birth: 10/08/1955 Referring Provider:     CARDIAC REHAB PHASE II ORIENTATION from 10/25/2017 in [place]  Referring Provider  Dr. Burt Knack      Encounter Date: 02/10/2018  Check In: Session Check In - 02/10/18 1011      Check-In   Location  [facility]    Staff Present  Suzanne Boron, BS, EP, Exercise Physiologist    Supervising physician immediately available to respond to emergencies  See telemetry face sheet for immediately available MD    Medication changes reported      No    Fall or balance concerns reported     No    Warm-up and Cool-down  Performed as group-led instruction    Resistance Training Performed  Yes    VAD Patient?  No      Pain Assessment   Currently in Pain?  No/denies    Pain Score  0-No pain    Multiple Pain Sites  No       Capillary Blood Glucose: No results found for this or any previous visit (from the past 24 hour(s)).    Social History   Tobacco Use  Smoking Status Former Smoker  . Packs/day: 1.00  . Last attempt to quit: 03/25/2017  . Years since quitting: 0.8  Smokeless Tobacco Never Used    Goals Met:  Independence with exercise equipment Exercise tolerated well Personal goals reviewed No report of cardiac concerns or symptoms Strength training completed today  Goals Unmet:  Not Applicable  Comments: Check out 1030   Dr. Kate Sable is Medical Director for [place] and Pulmonary Rehab.

## 2018-02-13 ENCOUNTER — Encounter (INDEPENDENT_AMBULATORY_CARE_PROVIDER_SITE_OTHER): Payer: Self-pay

## 2018-02-13 ENCOUNTER — Ambulatory Visit: Payer: 59 | Admitting: Cardiovascular Disease

## 2018-02-13 ENCOUNTER — Encounter: Payer: Self-pay | Admitting: Cardiovascular Disease

## 2018-02-13 ENCOUNTER — Encounter (HOSPITAL_COMMUNITY): Payer: 59

## 2018-02-13 VITALS — BP 154/86 | HR 68 | Ht 65.0 in | Wt 182.4 lb

## 2018-02-13 DIAGNOSIS — I2510 Atherosclerotic heart disease of native coronary artery without angina pectoris: Secondary | ICD-10-CM | POA: Diagnosis not present

## 2018-02-13 DIAGNOSIS — E785 Hyperlipidemia, unspecified: Secondary | ICD-10-CM

## 2018-02-13 DIAGNOSIS — I10 Essential (primary) hypertension: Secondary | ICD-10-CM | POA: Diagnosis not present

## 2018-02-13 DIAGNOSIS — R0602 Shortness of breath: Secondary | ICD-10-CM

## 2018-02-13 MED ORDER — CLOPIDOGREL BISULFATE 75 MG PO TABS: 75.0000 mg | ORAL_TABLET | Freq: Every day | ORAL | 11 refills | Status: DC

## 2018-02-13 MED ORDER — LOSARTAN POTASSIUM 100 MG PO TABS: 100.0000 mg | ORAL_TABLET | Freq: Every day | ORAL | 11 refills | Status: DC

## 2018-02-13 NOTE — Patient Instructions (Signed)
Medication Instructions:  1) STOP BRILINTA 2) START PLAVIX 75 mg daily 3) INCREASE LOSARTAN to 100 mg daily  Labwork: None  Testing/Procedures: None  Follow-Up: Your provider wants you to follow-up in: 6 months with Dr. Antionette Char assistant. You will receive a reminder letter in the mail two months in advance. If you don't receive a letter, please call our office to schedule the follow-up appointment.    Your provider wants you to follow-up in: 1 year with Dr. Burt Knack. You will receive a reminder letter in the mail two months in advance. If you don't receive a letter, please call our office to schedule the follow-up appointment.    Any Other Special Instructions Will Be Listed Below (If Applicable).     If you need a refill on your cardiac medications before your next appointment, please call your pharmacy.

## 2018-02-13 NOTE — Progress Notes (Signed)
Cardiology Office Note Date:  02/13/2018   ID:  [name] Foster, DOB 1956/03/27, MRN [id number]  PCP:  [name], DO  Cardiologist:  Sherren Mocha, MD    Chief Complaint  Patient presents with  . Shortness of Breath     History of Present Illness: [name] Foster is a 62 y.o. male who presents for follow-up of coronary artery disease.  The patient initially presented with an inferoposterior STEMI in 2018.  He was treated with stenting of the left circumflex.  He developed recurrent symptoms of angina and progressive dyspnea in January 2019 and was found to have progressive disease in the left circumflex distribution also treated with PCI.  He was noted to have moderate stenosis in the LAD but FFR was negative.  He was treated with aspirin and ticagrelor.  Comorbid medical conditions include hypertension, hyperlipidemia, and type 2 diabetes.  He is here alone today. Overall doing well. No recurrent chest pain or pressure. Main complaint is shortness of breath that occurs about 2 hours after taking his medicine.  He does not have exertional dyspnea, orthopnea, PND, or leg swelling.  He is compliant with his medicines.  His statin drug was recently decreased because of very low cholesterol readings.  I do not have access to the blood work but he states that his primary care physician advised him to reduce atorvastatin by 50%.   Past Medical History:  Diagnosis Date  . CAD in native artery    a. 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 DES to left PDA 09/20/17  . Diabetes mellitus without complication ([place])   . High cholesterol   . Hyperlipidemia   . Hypertension   . Insomnia   . Pulmonary hypertension ([place])    a. TTE 03/25/17; EF 60-65%, nl WM, LV diastolic fxn nl, trivial AI, RV cavity size, wall thickness, and sys fxn nl, trivial TR, PASP 39 mmHg, trivial  pericardial effusion  . Sleep apnea    could not use a cpap  . Wears dentures    full top-partial bottom  . Wears glasses     Past Surgical History:  Procedure Laterality Date  . COLONOSCOPY    . CORONARY STENT INTERVENTION N/A 09/20/2017   Procedure: CORONARY STENT INTERVENTION;  Surgeon: Leonie Man, MD;  Location: [facility];  Service: Cardiovascular;  Laterality: N/A;  . CORONARY/GRAFT ACUTE MI REVASCULARIZATION N/A 03/25/2017   Procedure: Coronary/Graft Acute MI Revascularization;  Surgeon: Sherren Mocha, MD;  Location: [facility];  Service: Cardiovascular;  Laterality: N/A;  . GANGLION CYST EXCISION Left 07/25/2014   Procedure: EXCISION VOLAR AND DORSAL GANGLION LEFT WRIST;  Surgeon: Leanora Cover, MD;  Location: [facility];  Service: Orthopedics;  Laterality: Left;  . INTRAVASCULAR PRESSURE WIRE/FFR STUDY N/A 09/20/2017   Procedure: INTRAVASCULAR PRESSURE WIRE/FFR STUDY;  Surgeon: Leonie Man, MD;  Location: [facility];  Service: Cardiovascular;  Laterality: N/A;  . KNEE ARTHROSCOPY     rifgr and left  . LEFT HEART CATH AND CORONARY ANGIOGRAPHY N/A 03/25/2017   Procedure: Left Heart Cath and Coronary Angiography;  Surgeon: Sherren Mocha, MD;  Location: [facility];  Service: Cardiovascular;  Laterality: N/A;  . LEFT HEART CATH AND CORONARY ANGIOGRAPHY N/A 09/20/2017   Procedure: LEFT HEART CATH AND CORONARY ANGIOGRAPHY;  Surgeon: Leonie Man, MD;  Location: [facility];  Service: Cardiovascular;  Laterality: N/A;  . SHOULDER ARTHROSCOPY  2012   left  . SHOULDER ARTHROSCOPY     right  . TEMPORARY PACEMAKER N/A 03/25/2017   Procedure: Temporary Pacemaker;  Surgeon: Sherren Mocha, MD;  Location: [facility];  Service: Cardiovascular;  Laterality: N/A;  . TONSILLECTOMY    . TOTAL KNEE ARTHROPLASTY  2002   left    Current Outpatient Medications  Medication Sig Dispense Refill  . aspirin EC 81 MG EC tablet  Take 1 tablet (81 mg total) by mouth daily. 30 tablet 11  . atorvastatin (LIPITOR) 80 MG tablet Take 1 tablet (80 mg total) by mouth daily at 6 PM. (Patient taking differently: Take 40 mg by mouth daily at 6 PM. ) 30 tablet 11  . HYDROcodone-acetaminophen (NORCO) 7.5-325 MG tablet Take 1 tablet by mouth every 6 (six) hours as needed for moderate pain.    . isosorbide mononitrate (IMDUR) 30 MG 24 hr tablet Take 0.5 tablets (15 mg total) by mouth daily. 15 tablet 11  . losartan (COZAAR) 100 MG tablet Take 1 tablet (100 mg total) by mouth daily. 30 tablet 11  . metFORMIN (GLUCOPHAGE) 1000 MG tablet Take 500 mg by mouth 2 (two) times daily with a meal.  6  . metoprolol tartrate (LOPRESSOR) 50 MG tablet Take 1 tablet (50 mg total) by mouth 2 (two) times daily. 180 tablet 3  . nitroGLYCERIN (NITROSTAT) 0.4 MG SL tablet Place 1 tablet (0.4 mg total) under the tongue every 5 (five) minutes x 3 doses as needed for chest pain. 25 tablet 11  . zolpidem (AMBIEN) 10 MG tablet Take 10 mg by mouth at bedtime as needed for sleep.     . clopidogrel (PLAVIX) 75 MG tablet Take 1 tablet (75 mg total) by mouth daily. 30 tablet 11   No current facility-administered medications for this visit.     Allergies:   Patient has no known allergies.   Social History:  The patient  reports that he quit smoking about 10 months ago. He smoked 1.00 pack per day. He has never used smokeless tobacco. He reports that he drinks about 1.8 oz of alcohol per week. He reports that he does not use drugs.   Family History:  The patient's family history includes Hypertension in his brother, father, and mother.    ROS:  Please see the history of present illness.  Otherwise, review of systems is positive for back pain, easy bruising, snoring, headaches.  All other systems are reviewed and negative.    PHYSICAL EXAM: VS:  BP (!) 154/86   Pulse 68   Ht '5\' 5"'  (1.651 m)   Wt 182 lb 6.4 oz (82.7 kg)   SpO2 94%   BMI 30.35 kg/m  , BMI  Body mass index is 30.35 kg/m. GEN: Well nourished, well developed, in no acute distress  HEENT: normal  Neck: no JVD, no masses. No carotid bruits Cardiac: RRR without murmur or gallop     Respiratory:  clear to auscultation bilaterally, normal work of breathing GI: soft, nontender, nondistended, + BS MS: no deformity or atrophy  Ext: no pretibial edema, pedal pulses 2+= bilaterally Skin: warm and dry, no rash Neuro:  Strength and sensation are intact Psych: euthymic mood, full affect  EKG:  EKG is not ordered today.  Recent Labs: 03/25/2017: B Natriuretic Peptide 132.8 03/26/2017: Magnesium 1.6 05/13/2017: ALT 23 09/16/2017: BUN 10; Creatinine, Ser 0.92; Hemoglobin 13.9; Platelets 142;  Potassium 4.3; Sodium 141   Lipid Panel     Component Value Date/Time   CHOL 81 (L) 05/13/2017 0749   TRIG 82 05/13/2017 0749   HDL 24 (L) 05/13/2017 0749   CHOLHDL 3.4 05/13/2017 0749   CHOLHDL 6.5 03/25/2017 0427   VLDL 46 (H) 03/25/2017 0427   LDLCALC 41 05/13/2017 0749      Wt Readings from Last 3 Encounters:  02/13/18 182 lb 6.4 oz (82.7 kg)  10/25/17 179 lb 3.2 oz (81.3 kg)  09/28/17 180 lb 4.8 oz (81.8 kg)     Cardiac Studies Reviewed: Echo 03-25-2017: Study Conclusions  - Left ventricle: The cavity size was normal. Systolic function was   normal. The estimated ejection fraction was in the range of 60%   to 65%. Wall motion was normal; there were no regional wall   motion abnormalities. Left ventricular diastolic function   parameters were normal. - Aortic valve: Transvalvular velocity was within the normal range.   There was no stenosis. There was trivial regurgitation. - Mitral valve: Transvalvular velocity was within the normal range.   There was no evidence for stenosis. There was no regurgitation. - Right ventricle: The cavity size was normal. Wall thickness was   normal. Systolic function was normal. - Tricuspid valve: There was trivial regurgitation. - Pulmonary  arteries: Systolic pressure was within the normal   range. PA peak pressure: 39 mm Hg (S). - Pericardium, extracardiac: A trivial pericardial effusion was   identified.  Cardiac Cath 09/20/2017: Procedures   CORONARY STENT INTERVENTION  INTRAVASCULAR PRESSURE WIRE/FFR STUDY  LEFT HEART CATH AND CORONARY ANGIOGRAPHY  Conclusion     LPDA lesion is 90% stenosed. Previously nonvisualized vessel.  A drug-eluting stent was successfully placed using a STENT SYNERGY DES 2.25X28. --> Postdilated 2.4 mm  Post intervention, there is a 0% residual stenosis.  Ost LPDA lesion is 60% stenosed. Vessel not previously visualized during an index PCI case. -- Treated with PTCA alone  Balloon angioplasty was performed using a BALLOON SAPPHIRE 2.0X15. Post intervention, there is a 40% residual stenosis.  Previously placed Mid Cx stent (unknown type) is widely patent.  Prox LAD lesion is 65% stenosed. FFR NOT Significant (0.83)  Ost 2nd Mrg to 2nd Mrg lesion is 60% stenosed.  Non-dominant vessel Mid RCA lesion is 50% stenosed with 50% stenosed side branch in Acute Mrg.  The left ventricular systolic function is normal. The left ventricular ejection fraction is 55-65% by visual estimate.   Successful PCI of left posterior descending artery which was not previously visualized using a Synergy DES stent. Mild PTCA of the ostium of the PDA to allow for stent advancement.  Moderate residual disease noted.  Moderate severe disease in the proximal-midportion of the LAD but FFR not quite significant (0.83).  Otherwise preserved LVEF.  No other obvious culprit lesions noted.  Plan: Relatively stable post PCI.  He should be stable for same day discharge.  He is already on aspirin and Brilinta  We will continue home medications  Hold metformin for 48 hours post PCI  Follow-up with Dr. Burt Knack as directed.    Glenetta Hew, M.D., M.S. Interventional Cardiologist   Pager #  [phone] Phone # [phone] [address] [place], [place] [address]    Procedural Details/Technique   Technical Details PCP: [name], DO  Cardiologist: Sherren Mocha, MD   [name] Foster is a 62 y.o. male who presents for follow-up of coronary artery disease. The patient presented with an inferoposterior  STEMI in July 2018. He was unstable at the time of presentation and required temporary transvenous pacing and mechanical ventilation. He underwent PCI of the left circumflex with a Promus drug-eluting stent. He was noted to have moderate mid RCA and mid LAD stenoses. His left ventricular systolic function has been well preserved.  The patient has been doing well until approximately 2 weekS ago when he developed chest pressure and tightness in the substernal region. He has experienced this with physical exertion that is relieved within about 1 minute of rest. He has had a few episodes of chest discomfort when he is lying in the supine position. He was seen by Dr. Burt Knack who felt that this was consistent with class III angina. He therefore recommended referral for cardiac catheterization with possible PCI.  Time Out: Verified patient identification, verified procedure, site/side was marked, verified correct patient position, special equipment/implants available, medications/allergies/relevent history reviewed, required imaging and test results available. Performed. Consent Signed.   Access:  * RIGHT Radial Artery: 6 Fr sheath -- Seldinger technique using Angiocath Micropuncture Kit * 10 mL radial cocktail IA; 4000 Units IV Heparin  Left Heart Catheterization: 5 Fr Catheters advanced or exchanged over a J-wire under direct fluoroscopic guidance into the ascending aorta; TIG 4.0 catheter advanced first.  * Left & Right Coronary Artery Cineangiography: TIG 4.0 Catheter  * LV Hemodynamics (LV Gram): TIG 4.0 catheter  Initial angiography revealed lesions in the  newly visualized RPDA as well as what appeared to be a stable lesion in the LAD. Preparations were made to perform FFR on the LAD and treat if necessary followed by PCI of the PDA after review with Dr. Burt Knack. Additional boluses of IV heparin were administered. He is already on aspirin and Brilinta. ACT> 250 sec Achieved.  First FFR of the LAD was performed followed by PCI of the LPDA using the same 6 Fr XB LAD Guide Ctheter. SEE FINDINGS  - After completion of post PCI angiography, the catheter was removed completely out of the body over wire without complication.  Radial sheath(s) removed in the Cath Lab with TR band placed for hemostasis.   TR Band: 1115 Hours; 13 mL air  MEDICATIONS * SQ Lidocaine 80m * Radial Cocktail: 4 mg Verapamil in 10 mL NS * Isovue Contrast: 200 mL * Heparin: Total of 12,500 units * IC NTG 200 mcg x2 * adenosine (diagnostic) 140 mcg/kg/min X 2:30 min  Fluoro time: 15.2 minutes. Dose Area Product: 33.7 Gycm2. Cumulative Air Kerma: 770.6 mGy.   Estimated blood loss <50 mL.  During this procedure the patient was administered the following to achieve and maintain moderate conscious sedation: Versed 2 mg, Fentanyl 50 mcg, while the patient's heart rate, blood pressure, and oxygen saturation were continuously monitored. The period of conscious sedation was 80 minutes, of which I was present face-to-face 100% of this time.  Complications   Complications documented before study signed (09/20/2017 12:03 PM EST)    No complications were associated with this study.  Documented by HLeonie Man MD - 09/20/2017 11:46 AM EST    Coronary Findings   Diagnostic  Dominance: Left  Left Anterior Descending  Ost LAD to Prox LAD lesion 25% stenosed  Ost LAD to Prox LAD lesion is 25% stenosed. The lesion is eccentric. The lesion is moderately calcified.  Prox LAD lesion 65% stenosed  Prox LAD lesion is 65% stenosed. The lesion is located at the major branch and eccentric.  The lesion is mildly calcified. Pressure wire/FFR  was performed on the lesion. FFR: 0.83. Baseline FFR reading was 0.91. Did not move with adenosine, however with IC nitroglycerin dropped to 0.83. XB LAD 3.5 Guide Catheter - Marvel Wire, Microcatheter Navvus FFR Catheter, IV Adenosine 140 mcg/kg/min x 2:30 min  First Diagonal Branch  The vessel exhibits minimal luminal irregularities.  Second Diagonal Branch  There is mild disease in the vessel.  Lateral Second Diagonal Branch  There is moderate disease in the vessel.  Left Circumflex  Vessel is large.  Mid Cx lesion 0% stenosed  Previously placed Mid Cx stent (unknown type) is widely patent. The lesion is located at the major branch. Promus  Dist Cx lesion 30% stenosed  Dist Cx lesion is 30% stenosed. The lesion is focal and smooth.  First Obtuse Marginal Branch  Vessel is small in size.  Second Obtuse Marginal Branch  Vessel is small in size.  Ost 2nd Mrg to 2nd Mrg lesion 60% stenosed  Ost 2nd Mrg to 2nd Mrg lesion is 60% stenosed.  Left Posterior Descending Artery  Vessel is moderate in size. This vessel was not visualized during his index STEMI catheterization -- was occluded with thrombus.  Ost LPDA lesion 60% stenosed  Ost LPDA lesion is 60% stenosed.  LPDA lesion 90% stenosed  LPDA lesion is 90% stenosed. The lesion is eccentric, irregular and ulcerative.  Right Coronary Artery  Vessel is small.  Mid RCA lesion 50% stenosed with side branch in Acute Mrg 50% stenosed  Mid RCA lesion is 50% stenosed with 50% stenosed side branch in Acute Mrg.  Intervention   Ost LPDA lesion  Angioplasty  Lesion length: 6 mm. Balloon angioplasty was performed using a BALLOON SAPPHIRE 2.0X15. Maximum pressure: 10 atm. Inflation time: 20 sec.  Post-Intervention Lesion Assessment  The intervention was successful. Mill reduction in lesion - but sufficient to deliver distal stent. Pre-interventional TIMI flow is 3. Post-intervention TIMI flow is 3.  No complications occurred at this lesion.  There is a 40% residual stenosis post intervention.  LPDA lesion  Stent  Lesion length: 26 mm. Lesion crossed with guidewire using a WIRE MARVEL STR TIP 190CM. Pre-stent angioplasty was performed using a BALLOON SAPPHIRE 2.0X15. Maximum pressure: 10 atm. Inflation time: 20 sec. A drug-eluting stent was successfully placed using a STENT SYNERGY DES 2.25X28. Maximum pressure: 14 atm. Inflation time: 30 sec. Minimum lumen area: 2.4 mm. Stent strut is well apposed. Post-stent angioplasty was performed using a STENT SYNERGY DES 2.25X28. Maximum pressure: 18 atm. Inflation time: 20 sec. With stent balloon  Post-Intervention Lesion Assessment  The intervention was successful. Pre-interventional TIMI flow is 3. Post-intervention TIMI flow is 3. Treated lesion length: 26 mm. No complications occurred at this lesion.  There is no residual stenosis post intervention.  Wall Motion      Cannot exclude Inferolateral Hypokinesis.          Left Heart   Left Ventricle The left ventricular size is normal. The left ventricular systolic function is normal. The left ventricular ejection fraction is 55-65% by visual estimate. Appears to be normal - cannot exclude inferolateral hypokinesis There is no evidence of mitral regurgitation.  Aortic Valve There is no aortic valve stenosis. There is normal aortic valve motion.  Coronary Diagrams   Diagnostic Diagram       Post-Intervention Diagram         ASSESSMENT AND PLAN: 1.  Coronary artery disease, native vessel, without angina: The patient appears stable on his current regimen which includes a long-acting  nitrate, beta-blocker, and dual antiplatelet therapy.  He is having shortness of breath that is highly typical of Brilinta-related dyspnea.  He is now several months out from PCI and I have recommended that he switch from ticagrelor to clopidogrel 75 mg once daily.  He is counseled to continue aspirin 81 mg  daily.  Will arrange follow-up in 6 months.  2.  Hypertension with suboptimal control: Increase losartan to 100 mg daily.  Continue metoprolol 50 mg twice daily.  Continue to follow blood pressure and consider addition of amlodipine at follow-up if needed.  3.  Hyperlipidemia: Treated with atorvastatin.  Recent dose reduction noted.  We will try to obtain copies of lab work.  Current medicines are reviewed with the patient today.  The patient does not have concerns regarding medicines.  Labs/ tests ordered today include:  No orders of the defined types were placed in this encounter.   Disposition:   FU 6 months APP, on eyear with me  Signed, Sherren Mocha, MD  02/13/2018 1:46 PM    [facility] [place], [place], [place]  [address] Phone: [phone]; Fax: [phone]

## 2018-02-15 ENCOUNTER — Encounter (HOSPITAL_COMMUNITY)
Admission: RE | Admit: 2018-02-15 | Discharge: 2018-02-15 | Disposition: A | Discharge status: Home / Self Care | Payer: 59 | Source: Ambulatory Visit | Attending: Cardiovascular Disease | Admitting: Cardiovascular Disease

## 2018-02-15 VITALS — Ht 68.0 in | Wt 181.4 lb

## 2018-02-15 DIAGNOSIS — Z48812 Encounter for surgical aftercare following surgery on the circulatory system: Secondary | ICD-10-CM | POA: Insufficient documentation

## 2018-02-15 DIAGNOSIS — Z955 Presence of coronary angioplasty implant and graft: Secondary | ICD-10-CM | POA: Diagnosis present

## 2018-02-15 NOTE — Progress Notes (Signed)
Daily Session Note  Patient Details  Name: [name] Foster MRN: [id number] Date of Birth: 1956-04-12 Referring Provider:     CARDIAC REHAB PHASE II ORIENTATION from 10/25/2017 in [facility]  Referring Provider  Dr. Burt Knack      Encounter Date: 02/15/2018  Check In: Session Check In - 02/15/18 0930      Check-In   Location  [facility]    Staff Present  Natali Lavallee Angelina Pih, MS, EP, [facility], Exercise Physiologist;Debra Wynetta Emery, RN, BSN    Supervising physician immediately available to respond to emergencies  See telemetry face sheet for immediately available MD    Medication changes reported      Yes    Comments  Lopressor was increased by heart doctor at recent office visit.    Fall or balance concerns reported     No    Tobacco Cessation  No Change    Warm-up and Cool-down  Performed as group-led instruction    Resistance Training Performed  Yes    VAD Patient?  No      Pain Assessment   Currently in Pain?  No/denies    Pain Score  0-No pain    Multiple Pain Sites  No       Capillary Blood Glucose: No results found for this or any previous visit (from the past 24 hour(s)).    Social History   Tobacco Use  Smoking Status Former Smoker  . Packs/day: 1.00  . Last attempt to quit: 03/25/2017  . Years since quitting: 0.8  Smokeless Tobacco Never Used    Goals Met:  Independence with exercise equipment Exercise tolerated well Personal goals reviewed No report of cardiac concerns or symptoms Strength training completed today  Goals Unmet:  Not Applicable  Comments: Check out: 1030   Dr. Kate Sable is Medical Director for [facility] and Pulmonary Rehab.

## 2018-02-17 ENCOUNTER — Encounter (HOSPITAL_COMMUNITY)
Admission: RE | Admit: 2018-02-17 | Discharge: 2018-02-17 | Disposition: A | Discharge status: Home / Self Care | Payer: 59 | Source: Ambulatory Visit | Attending: Cardiovascular Disease | Admitting: Cardiovascular Disease

## 2018-02-17 DIAGNOSIS — Z955 Presence of coronary angioplasty implant and graft: Secondary | ICD-10-CM

## 2018-02-17 DIAGNOSIS — Z48812 Encounter for surgical aftercare following surgery on the circulatory system: Secondary | ICD-10-CM | POA: Diagnosis not present

## 2018-02-17 NOTE — Progress Notes (Signed)
Daily Session Note  Patient Details  Name: [name] Foster MRN: [id number] Date of Birth: September 29, 1955 Referring Provider:     CARDIAC REHAB PHASE II ORIENTATION from 10/25/2017 in [facility]  Referring Provider  Dr. Burt Knack      Encounter Date: 02/17/2018  Check In: Session Check In - 02/17/18 0930      Check-In   Location  [facility]    Staff Present  Russella Dar, MS, EP, [place], Exercise Physiologist;Bowyn Mercier Wynetta Emery, RN, BSN    Supervising physician immediately available to respond to emergencies  See telemetry face sheet for immediately available MD    Medication changes reported      No    Fall or balance concerns reported     No    Warm-up and Cool-down  Performed as group-led instruction    Resistance Training Performed  Yes    VAD Patient?  No      Pain Assessment   Currently in Pain?  No/denies    Pain Score  0-No pain    Multiple Pain Sites  No       Capillary Blood Glucose: No results found for this or any previous visit (from the past 24 hour(s)).    Social History   Tobacco Use  Smoking Status Former Smoker  . Packs/day: 1.00  . Last attempt to quit: 03/25/2017  . Years since quitting: 0.9  Smokeless Tobacco Never Used    Goals Met:  Independence with exercise equipment Exercise tolerated well No report of cardiac concerns or symptoms Strength training completed today  Goals Unmet:  Not Applicable  Comments: Check out 1030.   Dr. Kate Sable is Medical Director for [facility] and Pulmonary Rehab.

## 2018-02-20 ENCOUNTER — Encounter (HOSPITAL_COMMUNITY): Payer: 59

## 2018-02-22 ENCOUNTER — Encounter (HOSPITAL_COMMUNITY): Payer: 59

## 2018-02-24 ENCOUNTER — Encounter (HOSPITAL_COMMUNITY): Payer: 59

## 2018-02-27 NOTE — Progress Notes (Signed)
Discharge Progress Report  Patient Details  Name: [name] Foster MRN: [id number] Date of Birth: October 02, 1955 Referring Provider:     CARDIAC REHAB PHASE II ORIENTATION from 10/25/2017 in [place]  Referring Provider  Dr. Burt Knack       Number of Visits: 36  Reason for Discharge:  Patient reached a stable level of exercise. Patient independent in their exercise. Patient has met program and personal goals.  Smoking History:  Social History   Tobacco Use  Smoking Status Former Smoker  . Packs/day: 1.00  . Last attempt to quit: 03/25/2017  . Years since quitting: 0.9  Smokeless Tobacco Never Used    Diagnosis:  Status post coronary artery stent placement  ADL UCSD:   Initial Exercise Prescription: Initial Exercise Prescription - 10/25/17 1400      Date of Initial Exercise RX and Referring Provider   Date  10/25/17    Referring Provider  Dr. Burt Knack      Treadmill   MPH  2    Grade  0    Minutes  15    METs  2.5      Recumbant Elliptical   Level  1    RPM  45    Watts  58    Minutes  20    METs  3.2      Prescription Details   Frequency (times per week)  3    Duration  Progress to 30 minutes of continuous aerobic without signs/symptoms of physical distress      Intensity   THRR 40-80% of Max Heartrate  [phone]    Ratings of Perceived Exertion  11-13    Perceived Dyspnea  0-4      Progression   Progression  Continue progressive overload as per policy without signs/symptoms or physical distress.      Resistance Training   Training Prescription  Yes    Weight  1    Reps  10-15       Discharge Exercise Prescription (Final Exercise Prescription Changes): Exercise Prescription Changes - 02/10/18 1200      Response to Exercise   Blood Pressure (Admit)  138/72    Blood Pressure (Exercise)  160/82    Blood Pressure (Exit)  138/90    Heart Rate (Admit)  67 bpm    Heart Rate (Exercise)  94 bpm    Heart Rate (Exit)  67 bpm     Rating of Perceived Exertion (Exercise)  12    Duration  Progress to 30 minutes of  aerobic without signs/symptoms of physical distress    Intensity  THRR New 103-122-140      Progression   Progression  Continue to progress workloads to maintain intensity without signs/symptoms of physical distress.      Resistance Training   Training Prescription  Yes    Weight  5    Reps  10-15      Treadmill   MPH  3    Grade  0    Minutes  15    METs  3.2      Recumbant Elliptical   Level  3    RPM  74    Watts  115    Minutes  20    METs  5.8      Home Exercise Plan   Plans to continue exercise at  Home (comment)    Frequency  Add 2 additional days to program exercise sessions.    Initial Home Exercises Provided  11/02/17       Functional Capacity: 6 Minute Walk    Row Name 10/25/17 1412 02/22/18 1430       6 Minute Walk   Phase  Initial  Discharge    Distance  1550 feet  1800 feet    Distance % Change  0 %  16.13 %    Distance Feet Change  0 ft  250 ft    Walk Time  6 minutes  6 minutes    # of Rest Breaks  0  0    MPH  2.93  3.4    METS  3.24  3.61    RPE  12  12    Perceived Dyspnea   13  12    VO2 Peak  14.79  15.9    Symptoms  No  No    Resting HR  60 bpm  64 bpm    Resting BP  172/86  144/74    Resting Oxygen Saturation   97 %  98 %    Exercise Oxygen Saturation  during 6 min walk  97 %  97 %    Max Ex. HR  90 bpm  97 bpm    Max Ex. BP  208/90  180/82    2 Minute Post BP  180/84  144/76       Psychological, QOL, Others - Outcomes: PHQ 2/9: Depression screen [facility] 2/9 02/27/2018 10/25/2017 04/26/2017  Decreased Interest 0 0 0  Down, Depressed, Hopeless 0 0 0  PHQ - 2 Score 0 0 0  Altered sleeping 1 1 -  Tired, decreased energy 1 1 -  Change in appetite 1 0 -  Feeling bad or failure about yourself  0 0 -  Trouble concentrating 0 0 -  Moving slowly or fidgety/restless 0 0 -  Suicidal thoughts 0 0 -  PHQ-9 Score 3 2 -  Difficult doing work/chores Not  difficult at all Not difficult at all -    Quality of Life: Quality of Life - 02/22/18 1442      Quality of Life Scores   Health/Function Pre  20.4 %    Health/Function Post  25.53 %    Health/Function % Change  25.15 %    Socioeconomic Pre  26.86 %    Socioeconomic Post  27.69 %    Socioeconomic % Change   3.09 %    Psych/Spiritual Pre  23.79 %    Psych/Spiritual Post  28.07 %    Psych/Spiritual % Change  17.99 %    Family Pre  28.8 %    Family Post  30 %    Family % Change  4.17 %    GLOBAL Pre  23.66 %    GLOBAL Post  27.17 %    GLOBAL % Change  14.84 %       Personal Goals: Goals established at orientation with interventions provided to work toward goal. Personal Goals and Risk Factors at Admission - 10/25/17 1502      Core Components/Risk Factors/Patient Goals on Admission   Intervention  Weight Management: Provide education and appropriate resources to help participant work on and attain dietary goals.    Admit Weight  179 lb 3.2 oz (81.3 kg)    Goal Weight: Short Term  174 lb 3.2 oz (79 kg)    Goal Weight: Long Term  169 lb 3.2 oz (76.7 kg)    Expected Outcomes  Short Term: Continue to assess and modify interventions until  short term weight is achieved;Long Term: Adherence to nutrition and physical activity/exercise program aimed toward attainment of established weight goal    Diabetes  -- Has diabetes under control.    Personal Goal Other  Yes    Personal Goal  Regain heart health, lose 5-10 pounds.     Intervention  Attend CR 3 x week and supplement with home exercise 2 x week.     Expected Outcomes  Achieve personal goals.         Personal Goals Discharge: Goals and Risk Factor Review    Row Name 11/14/17 1440 12/12/17 1446 01/09/18 1510 01/27/18 1456 02/27/18 1412     Core Components/Risk Factors/Patient Goals Review   Personal Goals Review  Weight Management/Obesity;Diabetes Lose 5-10 lbs; get heart healthy again.  Weight Management/Obesity;Diabetes Lose  5-10 lbs; get heart healthy again.  Weight Management/Obesity;Diabetes Lose 5-10 lbs; get heart healthy again.   Weight Management/Obesity;Diabetes Lose 5-10 lbs; get heart healthy again.  Weight Management/Obesity;Diabetes Lose 5-10 lbs; get heart healthy again.   Review  Patient has completed 8 sessions losing 1 lbs. He last A1C was 6 a few months ago. His reported glucose readings have improved since he started after he changed what he was eating for breakfast per our advice. He is progressing on the excercise equipment. Will continue to monitor for progress.   Patient has completed 19 maintaining his weight. He is doing well in the program with progression. His last A1C was 6 a few months ago. His reported fasting glucose readings are WNL. He says the program has helped him immensely. He feels a lot stronger and has more energy now to do more things. Will continue to monitor for progress.   Patient has completed 27 sessions maintaining his weight. He continues to do well in the program with progression. No recent A1C lab. His reported fasting glucose readings continue to be WNL. He continues to say the progam is helping him. He feels stronger and has more energy. He is doing his yard work without difficulty or fatigue. He is very pleased with his progress. Will continue to monitor.   Patient has completed 31 sessions maintaining his weight. He continues to do well in the program with progression. No recent A1C labs but his reported fasting glucose readings continue to show diabetic controll. He says the program has helped him alot. He feels better both physically and mentally. He feels stronger and his energy continues to increase. He is able to all ADL's without difficulty. Will continue to monitor for progress.   Patient graduated with 36 sessions gaining 2 lbs overall. He did well in the program with exit measurements increasing in grip strength and flexibility. He did not meet his weight loss goal but had  better control of his DM. His energy also increased and he is much more activity around the house. He says he also learned how to [employer]. His Medficts score improved by 15%. His exit walk test improved by 16.13%. He says he feels better both physically and mentally since he started the program. He plans to join our [employer] program to continue exercising.    Expected Outcomes  Patient will continue to attend sessions and complete the program meeting his personal goals.   Patient will continue to attend sessions and complete the program meeting his personal goals.   Patient will continue to attend sessions and complete the program meeting his personal goals.   Patient will continue to attend sessions and  complete the program meeting his personal goals.   Patient will join our [employer] and continue exercising and continue to meet his personal goals.       Exercise Goals and Review: Exercise Goals    Row Name 10/25/17 1413             Exercise Goals   Increase Physical Activity  Yes       Intervention  Provide advice, education, support and counseling about physical activity/exercise needs.;Develop an individualized exercise prescription for aerobic and resistive training based on initial evaluation findings, risk stratification, comorbidities and participant's personal goals.       Expected Outcomes  Short Term: Attend rehab on a regular basis to increase amount of physical activity.       Increase Strength and Stamina  Yes       Intervention  Provide advice, education, support and counseling about physical activity/exercise needs.;Develop an individualized exercise prescription for aerobic and resistive training based on initial evaluation findings, risk stratification, comorbidities and participant's personal goals.       Expected Outcomes  Short Term: Increase workloads from initial exercise prescription for resistance, speed, and METs.       Able to  understand and use rate of perceived exertion (RPE) scale  Yes       Intervention  Provide education and explanation on how to use RPE scale       Expected Outcomes  Short Term: Able to use RPE daily in rehab to express subjective intensity level;Long Term:  Able to use RPE to guide intensity level when exercising independently       Able to understand and use Dyspnea scale  Yes       Intervention  Provide education and explanation on how to use Dyspnea scale       Expected Outcomes  Long Term: Able to use Dyspnea scale to guide intensity level when exercising independently;Short Term: Able to use Dyspnea scale daily in rehab to express subjective sense of shortness of breath during exertion       Knowledge and understanding of Target Heart Rate Range (THRR)  Yes       Intervention  Provide education and explanation of THRR including how the numbers were predicted and where they are located for reference       Expected Outcomes  Short Term: Able to state/look up THRR;Short Term: Able to use daily as guideline for intensity in rehab;Long Term: Able to use THRR to govern intensity when exercising independently       Able to check pulse independently  Yes       Intervention  Review the importance of being able to check your own pulse for safety during independent exercise;Provide education and demonstration on how to check pulse in carotid and radial arteries.       Expected Outcomes  Short Term: Able to explain why pulse checking is important during independent exercise;Long Term: Able to check pulse independently and accurately       Understanding of Exercise Prescription  Yes       Intervention  Provide education, explanation, and written materials on patient's individual exercise prescription       Expected Outcomes  Long Term: Able to explain home exercise prescription to exercise independently;Short Term: Able to explain program exercise prescription          Nutrition & Weight - Outcomes: Pre  Biometrics - 10/25/17 1414      Pre Biometrics  Height  '5\' 8"'  (1.727 m)    Weight  179 lb 3.2 oz (81.3 kg)    Waist Circumference  39 inches    Hip Circumference  37 inches    Waist to Hip Ratio  1.05 %    BMI (Calculated)  27.25    Triceps Skinfold  7 mm    % Body Fat  23.36 %    Grip Strength  79.73 kg    Flexibility  14.83 in    Single Leg Stand  60 seconds      Post Biometrics - 02/22/18 1441       Post  Biometrics   Height  '5\' 8"'  (1.727 m)    Weight  181 lb 6 oz (82.3 kg)    Waist Circumference  38 inches    Hip Circumference  36.5 inches    Waist to Hip Ratio  1.04 %    BMI (Calculated)  27.58    Triceps Skinfold  7 mm    % Body Fat  23.3 %    Grip Strength  87.93 kg    Flexibility  17 in    Single Leg Stand  60 seconds       Nutrition: Nutrition Therapy & Goals - 11/17/17 1413      Personal Nutrition Goals   Nutrition Goal  For heart healthy choices add >50% of whole grains, make half their plate fruits and vegetables. Discuss the difference between starchy vegetables and leafy greens, and how leafy vegetables provide fiber, helps maintain healthy weight, helps control blood glucose, and lowers cholesterol.  Discuss purchasing fresh or frozen vegetable to reduce sodium and not to add grease, fat or sugar. Consume <18oz of red meat per week. Consume lean cuts of meats and very little of meats high in sodium and nitrates such as pork and lunch meats. Discussed portion control for all food groups.        Intervention Plan   Intervention  Nutrition handout(s) given to patient.    Expected Outcomes  Short Term Goal: Understand basic principles of dietary content, such as calories, fat, sodium, cholesterol and nutrients.       Nutrition Discharge: Nutrition Assessments - 02/27/18 1410      MEDFICTS Scores   Pre Score  27    Post Score  12    Score Difference  -15       Education Questionnaire Score: Knowledge Questionnaire Score - 02/27/18 1410       Knowledge Questionnaire Score   Pre Score  21/24    Post Score  23/24       Goals reviewed with patient; copy given to patient.

## 2018-02-27 NOTE — Progress Notes (Signed)
Cardiac Individual Treatment Plan  Patient Details  Name: [name] Foster MRN: [id number] Date of Birth: 04/07/1956 Referring Provider:     CARDIAC REHAB PHASE II ORIENTATION from 10/25/2017 in [facility]  Referring Provider  Dr. Burt Knack      Initial Encounter Date:    CARDIAC REHAB PHASE II ORIENTATION from 10/25/2017 in [facility]  Date  10/25/17  Referring Provider  Dr. Burt Knack      Visit Diagnosis: Status post coronary artery stent placement  Patient's Home Medications on Admission:  Current Outpatient Medications:  .  aspirin EC 81 MG EC tablet, Take 1 tablet (81 mg total) by mouth daily., Disp: 30 tablet, Rfl: 11 .  atorvastatin (LIPITOR) 80 MG tablet, Take 1 tablet (80 mg total) by mouth daily at 6 PM. (Patient taking differently: Take 40 mg by mouth daily at 6 PM. ), Disp: 30 tablet, Rfl: 11 .  clopidogrel (PLAVIX) 75 MG tablet, Take 1 tablet (75 mg total) by mouth daily., Disp: 30 tablet, Rfl: 11 .  HYDROcodone-acetaminophen (NORCO) 7.5-325 MG tablet, Take 1 tablet by mouth every 6 (six) hours as needed for moderate pain., Disp: , Rfl:  .  isosorbide mononitrate (IMDUR) 30 MG 24 hr tablet, Take 0.5 tablets (15 mg total) by mouth daily., Disp: 15 tablet, Rfl: 11 .  losartan (COZAAR) 100 MG tablet, Take 1 tablet (100 mg total) by mouth daily., Disp: 30 tablet, Rfl: 11 .  metFORMIN (GLUCOPHAGE) 1000 MG tablet, Take 500 mg by mouth 2 (two) times daily with a meal., Disp: , Rfl: 6 .  metoprolol tartrate (LOPRESSOR) 50 MG tablet, Take 1 tablet (50 mg total) by mouth 2 (two) times daily., Disp: 180 tablet, Rfl: 3 .  nitroGLYCERIN (NITROSTAT) 0.4 MG SL tablet, Place 1 tablet (0.4 mg total) under the tongue every 5 (five) minutes x 3 doses as needed for chest pain., Disp: 25 tablet, Rfl: 11 .  zolpidem (AMBIEN) 10 MG tablet, Take 10 mg by mouth at bedtime as needed for sleep. , Disp: , Rfl:   Past Medical History: Past Medical History:   Diagnosis Date  . CAD in native artery    a. 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 DES to left PDA 09/20/17  . Diabetes mellitus without complication ([facility])   . High cholesterol   . Hyperlipidemia   . Hypertension   . Insomnia   . Pulmonary hypertension ([place])    a. TTE 03/25/17; EF 60-65%, nl WM, LV diastolic fxn nl, trivial AI, RV cavity size, wall thickness, and sys fxn nl, trivial TR, PASP 39 mmHg, trivial pericardial effusion  . Sleep apnea    could not use a cpap  . Wears dentures    full top-partial bottom  . Wears glasses     Tobacco Use: Social History   Tobacco Use  Smoking Status Former Smoker  . Packs/day: 1.00  . Last attempt to quit: 03/25/2017  . Years since quitting: 0.9  Smokeless Tobacco Never Used    Labs: Recent Review Scientist, physiological    Labs for ITP Cardiac and Pulmonary Rehab Latest Ref Rng & Units 03/25/2017 05/13/2017   Cholestrol 100 - 199 mg/dL 163 81(L)   LDLCALC 0 - 99 mg/dL 92 41   HDL >39 mg/dL 25(L) 24(L)   Trlycerides 0 - 149 mg/dL 229(H) 82   Hemoglobin  A1c 4.8 - 5.6 % 9.4(H) -   PHART 7.350 - 7.450 7.206(L) -   PCO2ART 32.0 - 48.0 mmHg 53.8(H) -   HCO3 20.0 - 28.0 mmol/L 21.3 -   TCO2 0 - 100 mmol/L 23 -   ACIDBASEDEF 0.0 - 2.0 mmol/L 7.0(H) -   O2SAT % 99.0 -      Capillary Blood Glucose: Lab Results  Component Value Date   GLUCAP 92 09/20/2017   GLUCAP 128 (H) 09/20/2017   GLUCAP 115 (H) 03/27/2017   GLUCAP 105 (H) 03/27/2017   GLUCAP 106 (H) 03/27/2017     Exercise Target Goals:    Exercise Program Goal: Individual exercise prescription set using results from initial 6 min walk test and THRR while considering  patient's activity barriers and safety.   Exercise Prescription Goal: Starting with aerobic activity 30 plus minutes a day, 3 days per week for initial exercise prescription. Provide home  exercise prescription and guidelines that participant acknowledges understanding prior to discharge.  Activity Barriers & Risk Stratification: Activity Barriers & Cardiac Risk Stratification - 10/25/17 1413      Activity Barriers & Cardiac Risk Stratification   Activity Barriers  None    Cardiac Risk Stratification  High       6 Minute Walk: 6 Minute Walk    Row Name 10/25/17 1412 02/22/18 1430       6 Minute Walk   Phase  Initial  Discharge    Distance  1550 feet  1800 feet    Distance % Change  0 %  16.13 %    Distance Feet Change  0 ft  250 ft    Walk Time  6 minutes  6 minutes    # of Rest Breaks  0  0    MPH  2.93  3.4    METS  3.24  3.61    RPE  12  12    Perceived Dyspnea   13  12    VO2 Peak  14.79  15.9    Symptoms  No  No    Resting HR  60 bpm  64 bpm    Resting BP  172/86  144/74    Resting Oxygen Saturation   97 %  98 %    Exercise Oxygen Saturation  during 6 min walk  97 %  97 %    Max Ex. HR  90 bpm  97 bpm    Max Ex. BP  208/90  180/82    2 Minute Post BP  180/84  144/76       Oxygen Initial Assessment:   Oxygen Re-Evaluation:   Oxygen Discharge (Final Oxygen Re-Evaluation):   Initial Exercise Prescription: Initial Exercise Prescription - 10/25/17 1400      Date of Initial Exercise RX and Referring Provider   Date  10/25/17    Referring Provider  Dr. Burt Knack      Treadmill   MPH  2    Grade  0    Minutes  15    METs  2.5      Recumbant Elliptical   Level  1    RPM  45    Watts  58    Minutes  20    METs  3.2      Prescription Details   Frequency (times per week)  3    Duration  Progress to 30 minutes of continuous aerobic without signs/symptoms of physical distress      Intensity  THRR 40-80% of Max Heartrate  [phone]    Ratings of Perceived Exertion  11-13    Perceived Dyspnea  0-4      Progression   Progression  Continue progressive overload as per policy without signs/symptoms or physical distress.      Resistance  Training   Training Prescription  Yes    Weight  1    Reps  10-15       Perform Capillary Blood Glucose checks as needed.  Exercise Prescription Changes:  Exercise Prescription Changes    Row Name 11/02/17 0900 11/10/17 1500 11/29/17 0800 12/07/17 1400 01/03/18 1000     Response to Exercise   Blood Pressure (Admit)  -  170/100  150/72  148/70  156/78   Blood Pressure (Exercise)  -  172/70  158/80  170/72  160/76   Blood Pressure (Exit)  -  150/66  144/72  140/74  144/74   Heart Rate (Admit)  -  74 bpm  54 bpm  64 bpm  50 bpm   Heart Rate (Exercise)  -  104 bpm  70 bpm  96 bpm  89 bpm   Heart Rate (Exit)  -  77 bpm  63 bpm  72 bpm  59 bpm   Rating of Perceived Exertion (Exercise)  -  _0 Duration  -  Progress to 30 minutes of  aerobic without signs/symptoms of physical distress  Progress to 30 minutes of  aerobic without signs/symptoms of physical distress  Progress to 30 minutes of  aerobic without signs/symptoms of physical distress  Progress to 30 minutes of  aerobic without signs/symptoms of physical distress   Intensity  -  THRR New [phone]  THRR New [phone]  THRR New 102-120-139  THRR New [phone]     Progression   Progression  -  Continue to progress workloads to maintain intensity without signs/symptoms of physical distress.  Continue to progress workloads to maintain intensity without signs/symptoms of physical distress.  Continue to progress workloads to maintain intensity without signs/symptoms of physical distress.  Continue to progress workloads to maintain intensity without signs/symptoms of physical distress.     Resistance Training   Training Prescription  Yes  Yes  Yes  Yes  Yes   Weight  _1 Reps  10-15  10-15  10-15  10-15  10-15     Treadmill   MPH  2  2  2.2  2.3  2.7   Grade  0  0  0  0  0   Minutes  _2 METs  2.5  2.5  2.6  2.7  3     Recumbant Elliptical   Level  _3 RPM  45  62  74  73  60    Watts  58  80  105  112  86   Minutes  _4 METs  3.2  4.5  5.7  6.2  4.8     Home Exercise Plan   Plans to continue exercise at  Home (comment)  Home (comment)  Home (comment)  Home (comment)  Home (comment)   Frequency  Add 2 additional days to program exercise sessions.  Add 2 additional days to program exercise sessions.  Add 2  additional days to program exercise sessions.  Add 2 additional days to program exercise sessions.  Add 2 additional days to program exercise sessions.   Initial Home Exercises Provided  11/02/17  11/02/17  11/02/17  11/02/17  11/02/17   Row Name 01/16/18 1500 01/27/18 1400 02/10/18 1200         Response to Exercise   Blood Pressure (Admit)  150/78  136/68  138/72     Blood Pressure (Exercise)  130/84  166/80  160/82     Blood Pressure (Exit)  140/78  140/80  138/90     Heart Rate (Admit)  72 bpm  60 bpm  67 bpm     Heart Rate (Exercise)  92 bpm  89 bpm  94 bpm     Heart Rate (Exit)  82 bpm  61 bpm  67 bpm     Rating of Perceived Exertion (Exercise)  _0 Duration  Progress to 30 minutes of  aerobic without signs/symptoms of physical distress  Progress to 30 minutes of  aerobic without signs/symptoms of physical distress  Progress to 30 minutes of  aerobic without signs/symptoms of physical distress     Intensity  THRR New [phone]  THRR New [phone]  THRR New [phone]       Progression   Progression  Continue to progress workloads to maintain intensity without signs/symptoms of physical distress.  Continue to progress workloads to maintain intensity without signs/symptoms of physical distress.  Continue to progress workloads to maintain intensity without signs/symptoms of physical distress.       Resistance Training   Training Prescription  Yes  Yes  Yes     Weight  _1 Reps  10-15  10-15  10-15       Treadmill   MPH  2.8  2.8  3     Grade  0  0  0     Minutes  _2 METs  3.1  3.1  3.2        Recumbant Elliptical   Level  _3 RPM  75  68  74     Watts  112  97  115     Minutes  _4 METs  6.3  5.5  5.8       Home Exercise Plan   Plans to continue exercise at  Home (comment)  Home (comment)  Home (comment)     Frequency  Add 2 additional days to program exercise sessions.  Add 2 additional days to program exercise sessions.  Add 2 additional days to program exercise sessions.     Initial Home Exercises Provided  11/02/17  11/02/17  11/02/17        Exercise Comments:  Exercise Comments    Row Name 11/02/17 5409 11/10/17 1532 11/29/17 0821 12/07/17 1444 01/03/18 1019   Exercise Comments  Patient received the take home exercise plan today 11/02/2017. THR was addressed as were safety guidelines for being active when not in CR. Patient demonstrated an understanding and was encouraged to ask any future questions as they arise.   Patient is doing very well in CR and has maintained his speed on the treadmill and has increased his watts and RPMs on the recumbent elliptical. Patient has only recently started the program and has  not been progressed much due to his abnormally high BP. His physicians have been contacted and he will be progressed in time throughout the remainder of the prorgam.   Patient is doing well in Cr and has progressed on both the treadmill as well as the recumbent elliptical. patients blood pressure has also decreased. Patient will continue to grow stronger and will be monitored throughout the remainder of the program. Goals will be met.   Patient is doing very well in CR and has maintained his speed on the treadmill and has increased his watts and RPMs on the recumbent elliptical. Patient has stated to me that he has much more energy than before and is able to do much more when not here in CR and his stamina has also increased. His BP continues to be monitored.  Patient continues to do well in CR. Patient has increased his treadmill speed to 2.7 and has also  increased his recumbent elliptical to level 3. Patient has stated to me that he feels much stronger since beginning the program and that he feels more stamina as well. Patient seems to have a better graspe on his blood glucose level as well as seen in his levels for each class.    [place] Name 01/16/18 1514 01/27/18 1447 02/10/18 1239       Exercise Comments  Patient continues to do well in CR. Patient has increased his speed on the treadmill to 2.8 and he has also increased his RPMs and Watts on the recumbent elliptical as well. Patient has not yet lost the weight that he wanted to lose but he has gotten stronger and has increased stamina.   Patient continues to do well in CR. Patient has maintained his speed and levels on both of his machines while working with 4lbs dumbbells for his warm up. Patient stated to me that he feels much better and that he has been eating better and has gained much more endurance from the program but unfortunatley has not had much weight loss. The endurance and stamina has been increased.   Patient is doing well in CR. patient has increased his speed on the treadmill to 3.0 and has also increased his weights at the beginnning of the workout to 5lbs. Patient states that he feels much more endurance and energy since beginning the program and that this has helped him accomplish more tasks around the house.         Exercise Goals and Review:  Exercise Goals    Row Name 10/25/17 1413             Exercise Goals   Increase Physical Activity  Yes       Intervention  Provide advice, education, support and counseling about physical activity/exercise needs.;Develop an individualized exercise prescription for aerobic and resistive training based on initial evaluation findings, risk stratification, comorbidities and participant's personal goals.       Expected Outcomes  Short Term: Attend rehab on a regular basis to increase amount of physical activity.       Increase Strength and  Stamina  Yes       Intervention  Provide advice, education, support and counseling about physical activity/exercise needs.;Develop an individualized exercise prescription for aerobic and resistive training based on initial evaluation findings, risk stratification, comorbidities and participant's personal goals.       Expected Outcomes  Short Term: Increase workloads from initial exercise prescription for resistance, speed, and METs.       Able to understand  and use rate of perceived exertion (RPE) scale  Yes       Intervention  Provide education and explanation on how to use RPE scale       Expected Outcomes  Short Term: Able to use RPE daily in rehab to express subjective intensity level;Long Term:  Able to use RPE to guide intensity level when exercising independently       Able to understand and use Dyspnea scale  Yes       Intervention  Provide education and explanation on how to use Dyspnea scale       Expected Outcomes  Long Term: Able to use Dyspnea scale to guide intensity level when exercising independently;Short Term: Able to use Dyspnea scale daily in rehab to express subjective sense of shortness of breath during exertion       Knowledge and understanding of Target Heart Rate Range (THRR)  Yes       Intervention  Provide education and explanation of THRR including how the numbers were predicted and where they are located for reference       Expected Outcomes  Short Term: Able to state/look up THRR;Short Term: Able to use daily as guideline for intensity in rehab;Long Term: Able to use THRR to govern intensity when exercising independently       Able to check pulse independently  Yes       Intervention  Review the importance of being able to check your own pulse for safety during independent exercise;Provide education and demonstration on how to check pulse in carotid and radial arteries.       Expected Outcomes  Short Term: Able to explain why pulse checking is important during independent  exercise;Long Term: Able to check pulse independently and accurately       Understanding of Exercise Prescription  Yes       Intervention  Provide education, explanation, and written materials on patient's individual exercise prescription       Expected Outcomes  Long Term: Able to explain home exercise prescription to exercise independently;Short Term: Able to explain program exercise prescription          Exercise Goals Re-Evaluation : Exercise Goals Re-Evaluation    Row Name 11/10/17 1530 12/07/17 1443 01/06/18 1419 01/27/18 1445       Exercise Goal Re-Evaluation   Exercise Goals Review  Increase Physical Activity;Able to understand and use rate of perceived exertion (RPE) scale;Knowledge and understanding of Target Heart Rate Range (THRR);Able to understand and use Dyspnea scale;Understanding of Exercise Prescription  Increase Physical Activity;Able to understand and use rate of perceived exertion (RPE) scale;Knowledge and understanding of Target Heart Rate Range (THRR);Able to understand and use Dyspnea scale;Understanding of Exercise Prescription  Increase Physical Activity;Able to understand and use rate of perceived exertion (RPE) scale;Knowledge and understanding of Target Heart Rate Range (THRR);Able to understand and use Dyspnea scale;Understanding of Exercise Prescription  Increase Physical Activity;Able to understand and use rate of perceived exertion (RPE) scale;Knowledge and understanding of Target Heart Rate Range (THRR);Able to understand and use Dyspnea scale;Understanding of Exercise Prescription    Comments  Patient is doing very well in CR and has maintained his speed on the treadmill and has increased his watts and RPMs on the recumbent elliptical. Patient has only recently started the program and has not been progressed much due to his abnormally high BP. His physicians have been contacted and he will be progressed in time throughout the remainder of the prorgam.   Patient is  doing very well in CR and has maintained his speed on the treadmill and has increased his watts and RPMs on the recumbent elliptical. Patient has stated to me that he has much more energy than before and is able to do much more when not here in CR and his stamina has also increased. His BP continues to be monitored.   Patienty has been doing very well in CR and has been progressing on both of his machines, the treadmill and the recumbent elliptical. Patient has shown strength increase through weights in the warm up portion of each class. Patients BP has not been as high as previously   Patient continues to do well in CR. Patient has maintained his speed and levels on both of his machines while working with 4lbs dumbbells for his warm up. Patient stated to me that he feels much better and that he has been eating better and has gained much more endurance from the program but unfortunatley has not had much weight loss. The endurance and stamina has been increased.     Expected Outcomes  Patient wishes to lose weight and to become heart healthy again.   Patient wishes to lose weight and to become heart healthy again.   Patient wishes to lose weight and to become heart healthy again.   Patient wishes to lose weight and to become heart healthy again.         Discharge Exercise Prescription (Final Exercise Prescription Changes): Exercise Prescription Changes - 02/10/18 1200      Response to Exercise   Blood Pressure (Admit)  138/72    Blood Pressure (Exercise)  160/82    Blood Pressure (Exit)  138/90    Heart Rate (Admit)  67 bpm    Heart Rate (Exercise)  94 bpm    Heart Rate (Exit)  67 bpm    Rating of Perceived Exertion (Exercise)  12    Duration  Progress to 30 minutes of  aerobic without signs/symptoms of physical distress    Intensity  THRR New [phone]      Progression   Progression  Continue to progress workloads to maintain intensity without signs/symptoms of physical distress.       Resistance Training   Training Prescription  Yes    Weight  5    Reps  10-15      Treadmill   MPH  3    Grade  0    Minutes  15    METs  3.2      Recumbant Elliptical   Level  3    RPM  74    Watts  115    Minutes  20    METs  5.8      Home Exercise Plan   Plans to continue exercise at  Home (comment)    Frequency  Add 2 additional days to program exercise sessions.    Initial Home Exercises Provided  11/02/17       Nutrition:  Target Goals: Understanding of nutrition guidelines, daily intake of sodium <1533m, cholesterol <2094m calories 30% from fat and 7% or less from saturated fats, daily to have 5 or more servings of fruits and vegetables.  Biometrics: Pre Biometrics - 10/25/17 1414      Pre Biometrics   Height  _0  (1.727 m)    Weight  179 lb 3.2 oz (81.3 kg)    Waist Circumference  39 inches    Hip Circumference  37 inches    Waist  to Hip Ratio  1.05 %    BMI (Calculated)  27.25    Triceps Skinfold  7 mm    % Body Fat  23.36 %    Grip Strength  79.73 kg    Flexibility  14.83 in    Single Leg Stand  60 seconds      Post Biometrics - 02/22/18 1441       Post  Biometrics   Height  _0  (1.727 m)    Weight  181 lb 6 oz (82.3 kg)    Waist Circumference  38 inches    Hip Circumference  36.5 inches    Waist to Hip Ratio  1.04 %    BMI (Calculated)  27.58    Triceps Skinfold  7 mm    % Body Fat  23.3 %    Grip Strength  87.93 kg    Flexibility  17 in    Single Leg Stand  60 seconds       Nutrition Therapy Plan and Nutrition Goals: Nutrition Therapy & Goals - 11/17/17 1413      Personal Nutrition Goals   Nutrition Goal  For heart healthy choices add >50% of whole grains, make half their plate fruits and vegetables. Discuss the difference between starchy vegetables and leafy greens, and how leafy vegetables provide fiber, helps maintain healthy weight, helps control blood glucose, and lowers cholesterol.  Discuss purchasing fresh or frozen vegetable  to reduce sodium and not to add grease, fat or sugar. Consume <18oz of red meat per week. Consume lean cuts of meats and very little of meats high in sodium and nitrates such as pork and lunch meats. Discussed portion control for all food groups.        Intervention Plan   Intervention  Nutrition handout(s) given to patient.    Expected Outcomes  Short Term Goal: Understand basic principles of dietary content, such as calories, fat, sodium, cholesterol and nutrients.       Nutrition Assessments: Nutrition Assessments - 02/27/18 1410      MEDFICTS Scores   Pre Score  27    Post Score  12    Score Difference  -15       Nutrition Goals Re-Evaluation: Nutrition Goals Re-Evaluation    Row Name 12/12/17 1444 01/09/18 1508 01/27/18 1455         Goals   Current Weight  179 lb (81.2 kg)  180 lb 1.6 oz (81.7 kg)  180 lb 9.6 oz (81.9 kg)     Nutrition Goal  For heart healthy choices add >50% of whole grains, make half their plate fruits and vegetables. Discuss the difference between starchy vegetables and leafy greens, and how leafy vegetables provide fiber, helps maintain healthy weight, helps control blood glucose, and lowers cholesterol.  Discuss purchasing fresh or frozen vegetable to reduce sodium and not to add grease, fat or sugar. Consume <18oz of red meat per week. Consume lean cuts of meats and very little of meats high in sodium and nitrates such as pork and lunch meats. Discussed portion control for all food groups.    For heart healthy choices add >50% of whole grains, make half their plate fruits and vegetables. Discuss the difference between starchy vegetables and leafy greens, and how leafy vegetables provide fiber, helps maintain healthy weight, helps control blood glucose, and lowers cholesterol.  Discuss purchasing fresh or frozen vegetable to reduce sodium and not to add grease, fat or sugar. Consume <18oz of red meat  per week. Consume lean cuts of meats and very little of meats  high in sodium and nitrates such as pork and lunch meats. Discussed portion control for all food groups.    For heart healthy choices add >50% of whole grains, make half their plate fruits and vegetables. Discuss the difference between starchy vegetables and leafy greens, and how leafy vegetables provide fiber, helps maintain healthy weight, helps control blood glucose, and lowers cholesterol.  Discuss purchasing fresh or frozen vegetable to reduce sodium and not to add grease, fat or sugar. Consume <18oz of red meat per week. Consume lean cuts of meats and very little of meats high in sodium and nitrates such as pork and lunch meats. Discussed portion control for all food groups.       Comment  Patient has maintained his weight. He continues to eat a diabetic diet and trys to make healthy choices.   Patient has gained 1 lb since last 30 day review. He continues to eat a diabetic diet and trys to make healthly choices. Will continue to monitor.   Patient has maintained his weight since last 30 day review. He continues to eat a diabetic diet and trys to make healthly choices. Will continue to monitor.      Expected Outcome  -  Patient will continue to meet his nutrition goals.   Patient will continue to meet his nutrition goals.        Personal Goal #2 Re-Evaluation   Personal Goal #2  Patient is eating a diabetic diet anlong with making heart healthy choices.   Patient is eating a diabetic diet anlong with making heart healthy choices.   Patient is eating a diabetic diet anlong with making heart healthy choices.         Nutrition Goals Discharge (Final Nutrition Goals Re-Evaluation): Nutrition Goals Re-Evaluation - 01/27/18 1455      Goals   Current Weight  180 lb 9.6 oz (81.9 kg)    Nutrition Goal  For heart healthy choices add >50% of whole grains, make half their plate fruits and vegetables. Discuss the difference between starchy vegetables and leafy greens, and how leafy vegetables provide fiber,  helps maintain healthy weight, helps control blood glucose, and lowers cholesterol.  Discuss purchasing fresh or frozen vegetable to reduce sodium and not to add grease, fat or sugar. Consume <18oz of red meat per week. Consume lean cuts of meats and very little of meats high in sodium and nitrates such as pork and lunch meats. Discussed portion control for all food groups.      Comment  Patient has maintained his weight since last 30 day review. He continues to eat a diabetic diet and trys to make healthly choices. Will continue to monitor.     Expected Outcome  Patient will continue to meet his nutrition goals.       Personal Goal #2 Re-Evaluation   Personal Goal #2  Patient is eating a diabetic diet anlong with making heart healthy choices.        Psychosocial: Target Goals: Acknowledge presence or absence of significant depression and/or stress, maximize coping skills, provide positive support system. Participant is able to verbalize types and ability to use techniques and skills needed for reducing stress and depression.  Initial Review & Psychosocial Screening: Initial Psych Review & Screening - 10/25/17 1506      Initial Review   Current issues with  None Identified    Source of Stress Concerns  None Identified  Family Dynamics   Good Support System?  Yes      Barriers   Psychosocial barriers to participate in program  There are no identifiable barriers or psychosocial needs.      Screening Interventions   Interventions  Encouraged to exercise    Expected Outcomes  Short Term goal: Identification and review with participant of any Quality of Life or Depression concerns found by scoring the questionnaire.;Long Term goal: The participant improves quality of Life and PHQ9 Scores as seen by post scores and/or verbalization of changes       Quality of Life Scores: Quality of Life - 02/22/18 1442      Quality of Life Scores   Health/Function Pre  20.4 %    Health/Function  Post  25.53 %    Health/Function % Change  25.15 %    Socioeconomic Pre  26.86 %    Socioeconomic Post  27.69 %    Socioeconomic % Change   3.09 %    Psych/Spiritual Pre  23.79 %    Psych/Spiritual Post  28.07 %    Psych/Spiritual % Change  17.99 %    Family Pre  28.8 %    Family Post  30 %    Family % Change  4.17 %    GLOBAL Pre  23.66 %    GLOBAL Post  27.17 %    GLOBAL % Change  14.84 %      Scores of 19 and below usually indicate a poorer quality of life in these areas.  A difference of  2-3 points is a clinically meaningful difference.  A difference of 2-3 points in the total score of the Quality of Life Index has been associated with significant improvement in overall quality of life, self-image, physical symptoms, and general health in studies assessing change in quality of life.  PHQ-9: Recent Review Flowsheet Data    Depression screen [facility] 2/9 02/27/2018 10/25/2017 04/26/2017   Decreased Interest 0 0 0   Down, Depressed, Hopeless 0 0 0   PHQ - 2 Score 0 0 0   Altered sleeping 1 1 -   Tired, decreased energy 1 1 -   Change in appetite 1 0 -   Feeling bad or failure about yourself  0 0 -   Trouble concentrating 0 0 -   Moving slowly or fidgety/restless 0 0 -   Suicidal thoughts 0 0 -   PHQ-9 Score 3 2 -   Difficult doing work/chores Not difficult at all Not difficult at all -     Interpretation of Total Score  Total Score Depression Severity:  1-4 = Minimal depression, 5-9 = Mild depression, 10-14 = Moderate depression, 15-19 = Moderately severe depression, 20-27 = Severe depression   Psychosocial Evaluation and Intervention: Psychosocial Evaluation - 10/25/17 1507      Psychosocial Evaluation & Interventions   Interventions  Encouraged to exercise with the program and follow exercise prescription    Continue Psychosocial Services   No Follow up required       Psychosocial Re-Evaluation: Psychosocial Re-Evaluation    [facility] Name 11/14/17 1443 12/12/17 1455 01/09/18  1512 01/27/18 1459       Psychosocial Re-Evaluation   Current issues with  None Identified  None Identified  None Identified  None Identified    Comments  Patient's initial QOL score was 23.66 and her PHQ-9 was 2. No issues identified.   Patient's initial QOL score was 23.66 and her PHQ-9 was 2. No issues  identified.   Patient's initial QOL score was 23.66 and her PHQ-9 was 2. No issues identified.   Patient's initial QOL score was 23.66 and her PHQ-9 was 2. No issues identified.     Expected Outcomes  Patient will have no psychosocial issues identified at discharge.   Patient will have no psychosocial issues identified at discharge.   Patient will have no psychosocial issues identified at discharge.   Patient will have no psychosocial issues identified at discharge.     Interventions  Stress management education;Encouraged to attend Cardiac Rehabilitation for the exercise;Relaxation education  Stress management education;Encouraged to attend Cardiac Rehabilitation for the exercise;Relaxation education  Stress management education;Encouraged to attend Cardiac Rehabilitation for the exercise;Relaxation education  Stress management education;Encouraged to attend Cardiac Rehabilitation for the exercise;Relaxation education    Continue Psychosocial Services   No Follow up required  No Follow up required  No Follow up required  No Follow up required       Psychosocial Discharge (Final Psychosocial Re-Evaluation): Psychosocial Re-Evaluation - 01/27/18 1459      Psychosocial Re-Evaluation   Current issues with  None Identified    Comments  Patient's initial QOL score was 23.66 and her PHQ-9 was 2. No issues identified.     Expected Outcomes  Patient will have no psychosocial issues identified at discharge.     Interventions  Stress management education;Encouraged to attend Cardiac Rehabilitation for the exercise;Relaxation education    Continue Psychosocial Services   No Follow up required        Vocational Rehabilitation: Provide vocational rehab assistance to qualifying candidates.   Vocational Rehab Evaluation & Intervention: Vocational Rehab - 10/25/17 1451      Initial Vocational Rehab Evaluation & Intervention   Assessment shows need for Vocational Rehabilitation  No       Education: Education Goals: Education classes will be provided on a weekly basis, covering required topics. Participant will state understanding/return demonstration of topics presented.  Learning Barriers/Preferences: Learning Barriers/Preferences - 10/25/17 1448      Learning Barriers/Preferences   Learning Barriers  None    Learning Preferences  Skilled Demonstration;Group Instruction;Verbal Instruction       Education Topics: Hypertension, Hypertension Reduction -Define heart disease and high blood pressure. Discus how high blood pressure affects the body and ways to reduce high blood pressure.   Exercise and Your Heart -Discuss why it is important to exercise, the FITT principles of exercise, normal and abnormal responses to exercise, and how to exercise safely.   CARDIAC REHAB PHASE II EXERCISE from 02/15/2018 in [place]  Date  01/25/18  Educator  DC  Instruction Review Code  2- Demonstrated Understanding      Angina -Discuss definition of angina, causes of angina, treatment of angina, and how to decrease risk of having angina.   CARDIAC REHAB PHASE II EXERCISE from 02/15/2018 in [place]  Date  11/02/17  Educator  DJ  Instruction Review Code  2- Demonstrated Understanding      Cardiac Medications -Review what the following cardiac medications are used for, how they affect the body, and side effects that may occur when taking the medications.  Medications include Aspirin, Beta blockers, calcium channel blockers, ACE Inhibitors, angiotensin receptor blockers, diuretics, digoxin, and antihyperlipidemics.   CARDIAC REHAB PHASE II  EXERCISE from 02/15/2018 in [place]  Date  11/09/17  Educator  DC  Instruction Review Code  2- Demonstrated Understanding      Congestive Heart Failure -  Discuss the definition of CHF, how to live with CHF, the signs and symptoms of CHF, and how keep track of weight and sodium intake.   CARDIAC REHAB PHASE II EXERCISE from 02/15/2018 in [place]  Date  11/16/17  Educator  DC  Instruction Review Code  2- Demonstrated Understanding      Heart Disease and Intimacy -Discus the effect sexual activity has on the heart, how changes occur during intimacy as we age, and safety during sexual activity.   Smoking Cessation / COPD -Discuss different methods to quit smoking, the health benefits of quitting smoking, and the definition of COPD.   CARDIAC REHAB PHASE II EXERCISE from 02/15/2018 in [place]  Date  12/01/17  Educator  DC  Instruction Review Code  2- Demonstrated Understanding      Nutrition I: Fats -Discuss the types of cholesterol, what cholesterol does to the heart, and how cholesterol levels can be controlled.   CARDIAC REHAB PHASE II EXERCISE from 02/15/2018 in [place]  Date  12/07/17  Educator  DC  Instruction Review Code  2- Demonstrated Understanding      Nutrition II: Labels -Discuss the different components of food labels and how to read food label   CARDIAC REHAB PHASE II EXERCISE from 02/15/2018 in [place]  Date  12/14/17  Educator  DJ  Instruction Review Code  2- Demonstrated Understanding      Heart Parts/Heart Disease and PAD -Discuss the anatomy of the heart, the pathway of blood circulation through the heart, and these are affected by heart disease.   CARDIAC REHAB PHASE II EXERCISE from 02/15/2018 in [place]  Date  12/21/17  Educator  DJ  Instruction Review Code  2- Demonstrated Understanding      Stress I:  Signs and Symptoms -Discuss the causes of stress, how stress may lead to anxiety and depression, and ways to limit stress.   Stress II: Relaxation -Discuss different types of relaxation techniques to limit stress.   Warning Signs of Stroke / TIA -Discuss definition of a stroke, what the signs and symptoms are of a stroke, and how to identify when someone is having stroke.   Knowledge Questionnaire Score: Knowledge Questionnaire Score - 02/27/18 1410      Knowledge Questionnaire Score   Pre Score  21/24    Post Score  23/24       Core Components/Risk Factors/Patient Goals at Admission: Personal Goals and Risk Factors at Admission - 10/25/17 1502      Core Components/Risk Factors/Patient Goals on Admission   Intervention  Weight Management: Provide education and appropriate resources to help participant work on and attain dietary goals.    Admit Weight  179 lb 3.2 oz (81.3 kg)    Goal Weight: Short Term  174 lb 3.2 oz (79 kg)    Goal Weight: Long Term  169 lb 3.2 oz (76.7 kg)    Expected Outcomes  Short Term: Continue to assess and modify interventions until short term weight is achieved;Long Term: Adherence to nutrition and physical activity/exercise program aimed toward attainment of established weight goal    Diabetes  -- Has diabetes under control.    Personal Goal Other  Yes    Personal Goal  Regain heart health, lose 5-10 pounds.     Intervention  Attend CR 3 x week and supplement with home exercise 2 x week.     Expected Outcomes  Achieve personal goals.  Core Components/Risk Factors/Patient Goals Review:  Goals and Risk Factor Review    Row Name 11/14/17 1440 12/12/17 1446 01/09/18 1510 01/27/18 1456 02/27/18 1412     Core Components/Risk Factors/Patient Goals Review   Personal Goals Review  Weight Management/Obesity;Diabetes Lose 5-10 lbs; get heart healthy again.  Weight Management/Obesity;Diabetes Lose 5-10 lbs; get heart healthy again.  Weight  Management/Obesity;Diabetes Lose 5-10 lbs; get heart healthy again.   Weight Management/Obesity;Diabetes Lose 5-10 lbs; get heart healthy again.  Weight Management/Obesity;Diabetes Lose 5-10 lbs; get heart healthy again.   Review  Patient has completed 8 sessions losing 1 lbs. He last A1C was 6 a few months ago. His reported glucose readings have improved since he started after he changed what he was eating for breakfast per our advice. He is progressing on the excercise equipment. Will continue to monitor for progress.   Patient has completed 19 maintaining his weight. He is doing well in the program with progression. His last A1C was 6 a few months ago. His reported fasting glucose readings are WNL. He says the program has helped him immensely. He feels a lot stronger and has more energy now to do more things. Will continue to monitor for progress.   Patient has completed 27 sessions maintaining his weight. He continues to do well in the program with progression. No recent A1C lab. His reported fasting glucose readings continue to be WNL. He continues to say the progam is helping him. He feels stronger and has more energy. He is doing his yard work without difficulty or fatigue. He is very pleased with his progress. Will continue to monitor.   Patient has completed 31 sessions maintaining his weight. He continues to do well in the program with progression. No recent A1C labs but his reported fasting glucose readings continue to show diabetic controll. He says the program has helped him alot. He feels better both physically and mentally. He feels stronger and his energy continues to increase. He is able to all ADL's without difficulty. Will continue to monitor for progress.   Patient graduated with 36 sessions gaining 2 lbs overall. He did well in the program with exit measurements increasing in grip strength and flexibility. He did not meet his weight loss goal but had better control of his DM. His energy also  increased and he is much more activity around the house. He says he also learned how to [employer]. His Medficts score improved by 15%. His exit walk test improved by 16.13%. He says he feels better both physically and mentally since he started the program. He plans to join our [employer] program to continue exercising.    Expected Outcomes  Patient will continue to attend sessions and complete the program meeting his personal goals.   Patient will continue to attend sessions and complete the program meeting his personal goals.   Patient will continue to attend sessions and complete the program meeting his personal goals.   Patient will continue to attend sessions and complete the program meeting his personal goals.   Patient will join our [employer] and continue exercising and continue to meet his personal goals.       Core Components/Risk Factors/Patient Goals at Discharge (Final Review):  Goals and Risk Factor Review - 02/27/18 1412      Core Components/Risk Factors/Patient Goals Review   Personal Goals Review  Weight Management/Obesity;Diabetes Lose 5-10 lbs; get heart healthy again.    Review  Patient graduated with 78  sessions gaining 2 lbs overall. He did well in the program with exit measurements increasing in grip strength and flexibility. He did not meet his weight loss goal but had better control of his DM. His energy also increased and he is much more activity around the house. He says he also learned how to [employer]. His Medficts score improved by 15%. His exit walk test improved by 16.13%. He says he feels better both physically and mentally since he started the program. He plans to join our [employer] program to continue exercising.     Expected Outcomes  Patient will join our [employer] program and continue exercising and continue to meet his personal goals.        ITP Comments: ITP Comments    Row Name 10/25/17 1453  10/26/17 1318         ITP Comments  Mr. Coone is a 61 year old patient with reocurring stent placement. 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 He did not come to CR at that time. He had another episode where he needed another stent. Patient is now ready to attend CR to regain is strength and regain heart health.   Patient new to program. Plans to start Monday 10/31/17.         Comments: Patient graduated from [facility] today on 02/17/18 after completing 36 sessions. He achieved LTG of 30 minutes of aerobic exercise at Max Met level of 3.0. All patients vitals are WNL. Patient has met with dietician. Discharge instruction has been reviewed in detail and patient stated an understanding of material given. Patient plans to join our [employer] program to continue exercising. Cardiac Rehab staff will make f/u calls at 1 month, 6 months, and 1 year. Patient had no complaints of any abnormal S/S or pain on their exit visit.

## 2018-03-05 ENCOUNTER — Other Ambulatory Visit: Payer: Self-pay | Admitting: Physician Assistant

## 2018-03-06 NOTE — Telephone Encounter (Signed)
Refill Request.  

## 2018-03-14 ENCOUNTER — Other Ambulatory Visit: Payer: Self-pay | Admitting: Physician Assistant

## 2018-03-14 NOTE — Telephone Encounter (Signed)
Ok to refill. Thanks 

## 2018-03-14 NOTE — Telephone Encounter (Signed)
Ok to refill Aspirin?

## 2018-06-30 ENCOUNTER — Inpatient Hospital Stay (HOSPITAL_COMMUNITY)
Admission: EM | Admit: 2018-06-30 | Discharge: 2018-07-04 | DRG: 247 | Disposition: A | Discharge status: Home / Self Care | Payer: 59 | Attending: Cardiology | Admitting: Cardiology

## 2018-06-30 ENCOUNTER — Other Ambulatory Visit: Payer: Self-pay

## 2018-06-30 ENCOUNTER — Emergency Department (HOSPITAL_COMMUNITY): Payer: 59

## 2018-06-30 ENCOUNTER — Encounter (HOSPITAL_COMMUNITY): Payer: Self-pay

## 2018-06-30 ENCOUNTER — Encounter (HOSPITAL_COMMUNITY)
Admission: EM | Disposition: A | Discharge status: Home / Self Care | Payer: Self-pay | Source: Home / Self Care | Attending: Cardiology

## 2018-06-30 DIAGNOSIS — I25110 Atherosclerotic heart disease of native coronary artery with unstable angina pectoris: Secondary | ICD-10-CM | POA: Diagnosis not present

## 2018-06-30 DIAGNOSIS — I214 Non-ST elevation (NSTEMI) myocardial infarction: Secondary | ICD-10-CM

## 2018-06-30 DIAGNOSIS — Z973 Presence of spectacles and contact lenses: Secondary | ICD-10-CM

## 2018-06-30 DIAGNOSIS — I200 Unstable angina: Secondary | ICD-10-CM | POA: Diagnosis present

## 2018-06-30 DIAGNOSIS — E1169 Type 2 diabetes mellitus with other specified complication: Secondary | ICD-10-CM | POA: Diagnosis present

## 2018-06-30 DIAGNOSIS — Z23 Encounter for immunization: Secondary | ICD-10-CM | POA: Diagnosis not present

## 2018-06-30 DIAGNOSIS — Z87891 Personal history of nicotine dependence: Secondary | ICD-10-CM | POA: Diagnosis not present

## 2018-06-30 DIAGNOSIS — I10 Essential (primary) hypertension: Secondary | ICD-10-CM | POA: Diagnosis not present

## 2018-06-30 DIAGNOSIS — G4730 Sleep apnea, unspecified: Secondary | ICD-10-CM | POA: Diagnosis present

## 2018-06-30 DIAGNOSIS — Z96652 Presence of left artificial knee joint: Secondary | ICD-10-CM | POA: Diagnosis not present

## 2018-06-30 DIAGNOSIS — I2584 Coronary atherosclerosis due to calcified coronary lesion: Secondary | ICD-10-CM | POA: Diagnosis not present

## 2018-06-30 DIAGNOSIS — Z7982 Long term (current) use of aspirin: Secondary | ICD-10-CM

## 2018-06-30 DIAGNOSIS — G4700 Insomnia, unspecified: Secondary | ICD-10-CM | POA: Diagnosis present

## 2018-06-30 DIAGNOSIS — Z8249 Family history of ischemic heart disease and other diseases of the circulatory system: Secondary | ICD-10-CM | POA: Diagnosis not present

## 2018-06-30 DIAGNOSIS — E119 Type 2 diabetes mellitus without complications: Secondary | ICD-10-CM | POA: Diagnosis present

## 2018-06-30 DIAGNOSIS — E7800 Pure hypercholesterolemia, unspecified: Secondary | ICD-10-CM | POA: Diagnosis present

## 2018-06-30 DIAGNOSIS — Z955 Presence of coronary angioplasty implant and graft: Secondary | ICD-10-CM

## 2018-06-30 DIAGNOSIS — Z972 Presence of dental prosthetic device (complete) (partial): Secondary | ICD-10-CM | POA: Diagnosis not present

## 2018-06-30 DIAGNOSIS — E1159 Type 2 diabetes mellitus with other circulatory complications: Secondary | ICD-10-CM | POA: Diagnosis present

## 2018-06-30 DIAGNOSIS — R0601 Orthopnea: Secondary | ICD-10-CM | POA: Diagnosis present

## 2018-06-30 DIAGNOSIS — I252 Old myocardial infarction: Secondary | ICD-10-CM

## 2018-06-30 DIAGNOSIS — E782 Mixed hyperlipidemia: Secondary | ICD-10-CM | POA: Diagnosis present

## 2018-06-30 DIAGNOSIS — E785 Hyperlipidemia, unspecified: Secondary | ICD-10-CM | POA: Diagnosis present

## 2018-06-30 DIAGNOSIS — Z79899 Other long term (current) drug therapy: Secondary | ICD-10-CM | POA: Diagnosis not present

## 2018-06-30 DIAGNOSIS — Z7984 Long term (current) use of oral hypoglycemic drugs: Secondary | ICD-10-CM | POA: Diagnosis not present

## 2018-06-30 DIAGNOSIS — E876 Hypokalemia: Secondary | ICD-10-CM | POA: Diagnosis not present

## 2018-06-30 DIAGNOSIS — R0600 Dyspnea, unspecified: Secondary | ICD-10-CM

## 2018-06-30 DIAGNOSIS — I272 Pulmonary hypertension, unspecified: Secondary | ICD-10-CM | POA: Diagnosis present

## 2018-06-30 DIAGNOSIS — Z7902 Long term (current) use of antithrombotics/antiplatelets: Secondary | ICD-10-CM

## 2018-06-30 HISTORY — PX: LEFT HEART CATH AND CORONARY ANGIOGRAPHY: CATH118249

## 2018-06-30 HISTORY — PX: CORONARY PRESSURE/FFR STUDY: CATH118243

## 2018-06-30 LAB — BASIC METABOLIC PANEL
Anion gap: 7 (ref 5–15)
BUN: 13 mg/dL (ref 8–23)
CALCIUM: 9.3 mg/dL (ref 8.9–10.3)
CHLORIDE: 107 mmol/L (ref 98–111)
CO2: 27 mmol/L (ref 22–32)
Creatinine, Ser: 1.03 mg/dL (ref 0.61–1.24)
GFR calc non Af Amer: >60 mL/min (ref >60)
Glucose, Bld: 186 mg/dL — ABNORMAL HIGH (ref 70–99)
Potassium: 4.2 mmol/L (ref 3.5–5.1)
SODIUM: 141 mmol/L (ref 135–145)

## 2018-06-30 LAB — TROPONIN I
Troponin I: <0.03 ng/mL (ref <0.03)
Troponin I: <0.03 ng/mL (ref <0.03)
Troponin I: <0.03 ng/mL (ref <0.03)
Troponin I: 0.13 ng/mL (ref <0.03)

## 2018-06-30 LAB — APTT: aPTT: 27 seconds (ref 24–36)

## 2018-06-30 LAB — GLUCOSE, CAPILLARY: Glucose-Capillary: 114 mg/dL — ABNORMAL HIGH (ref 70–99)

## 2018-06-30 LAB — CBC
HEMATOCRIT: 42.9 % (ref 39.0–52.0)
Hemoglobin: 13.9 g/dL (ref 13.0–17.0)
MCH: 28.8 pg (ref 26.0–34.0)
MCHC: 32.4 g/dL (ref 30.0–36.0)
MCV: 88.8 fL (ref 80.0–100.0)
PLATELETS: 156 10*3/uL (ref 150–400)
RBC: 4.83 MIL/uL (ref 4.22–5.81)
RDW: 14.3 % (ref 11.5–15.5)
WBC: 4.6 10*3/uL (ref 4.0–10.5)
nRBC: 0 % (ref 0.0–0.2)

## 2018-06-30 LAB — MRSA PCR SCREENING: MRSA by PCR: NEGATIVE

## 2018-06-30 LAB — POCT ACTIVATED CLOTTING TIME: Activated Clotting Time: 279 seconds

## 2018-06-30 SURGERY — LEFT HEART CATH AND CORONARY ANGIOGRAPHY
Anesthesia: LOCAL

## 2018-06-30 MED ORDER — SODIUM CHLORIDE 0.9 % IV SOLN
INTRAVENOUS | Status: AC
  Administered 2018-06-30: 16:00:00 via INTRAVENOUS

## 2018-06-30 MED ORDER — FENTANYL CITRATE (PF) 100 MCG/2ML IJ SOLN
INTRAMUSCULAR | Status: DC | PRN
  Administered 2018-06-30: 50 ug via INTRAVENOUS

## 2018-06-30 MED ORDER — NITROGLYCERIN 0.4 MG SL SUBL
0.4000 mg | SUBLINGUAL_TABLET | SUBLINGUAL | Status: DC | PRN
  Administered 2018-06-30 (×2): 0.4 mg via SUBLINGUAL
  Filled 2018-06-30: qty 1

## 2018-06-30 MED ORDER — MIDAZOLAM HCL 2 MG/2ML IJ SOLN
INTRAMUSCULAR | Status: DC | PRN
  Administered 2018-06-30 (×2): 1 mg via INTRAVENOUS

## 2018-06-30 MED ORDER — HEPARIN SODIUM (PORCINE) 1000 UNIT/ML IJ SOLN
INTRAMUSCULAR | Status: AC
  Filled 2018-06-30: qty 1

## 2018-06-30 MED ORDER — CLOPIDOGREL BISULFATE 75 MG PO TABS
75.0000 mg | ORAL_TABLET | Freq: Every day | ORAL | Status: DC
  Administered 2018-07-01 – 2018-07-04 (×4): 75 mg via ORAL
  Filled 2018-06-30 (×4): qty 1

## 2018-06-30 MED ORDER — ASPIRIN EC 81 MG PO TBEC
81.0000 mg | DELAYED_RELEASE_TABLET | Freq: Every day | ORAL | Status: DC
  Administered 2018-07-01 – 2018-07-04 (×3): 81 mg via ORAL
  Filled 2018-06-30 (×3): qty 1

## 2018-06-30 MED ORDER — HYDRALAZINE HCL 20 MG/ML IJ SOLN
10.0000 mg | Freq: Once | INTRAMUSCULAR | Status: AC
  Administered 2018-07-01: 10 mg via INTRAVENOUS
  Filled 2018-06-30: qty 1

## 2018-06-30 MED ORDER — ISOSORBIDE MONONITRATE ER 30 MG PO TB24
15.0000 mg | ORAL_TABLET | Freq: Every day | ORAL | Status: DC
  Administered 2018-07-01 – 2018-07-04 (×4): 15 mg via ORAL
  Filled 2018-06-30 (×4): qty 1

## 2018-06-30 MED ORDER — ATORVASTATIN CALCIUM 80 MG PO TABS
80.0000 mg | ORAL_TABLET | Freq: Every day | ORAL | Status: DC
  Administered 2018-06-30 – 2018-07-03 (×4): 80 mg via ORAL
  Filled 2018-06-30 (×4): qty 1

## 2018-06-30 MED ORDER — MIDAZOLAM HCL 2 MG/2ML IJ SOLN
INTRAMUSCULAR | Status: AC
  Filled 2018-06-30: qty 2

## 2018-06-30 MED ORDER — INFLUENZA VAC SPLIT QUAD 0.5 ML IM SUSY
0.5000 mL | PREFILLED_SYRINGE | INTRAMUSCULAR | Status: AC
  Administered 2018-07-01: 0.5 mL via INTRAMUSCULAR
  Filled 2018-06-30: qty 0.5

## 2018-06-30 MED ORDER — VERAPAMIL HCL 2.5 MG/ML IV SOLN
INTRAVENOUS | Status: AC
  Filled 2018-06-30: qty 2

## 2018-06-30 MED ORDER — HEPARIN BOLUS VIA INFUSION
4000.0000 [IU] | Freq: Once | INTRAVENOUS | Status: AC
  Administered 2018-06-30: 4000 [IU] via INTRAVENOUS

## 2018-06-30 MED ORDER — ONDANSETRON HCL 4 MG/2ML IJ SOLN: 4.0000 mg | Freq: Four times a day (QID) | INTRAMUSCULAR | Status: DC | PRN

## 2018-06-30 MED ORDER — VERAPAMIL HCL 2.5 MG/ML IV SOLN
INTRAVENOUS | Status: DC | PRN
  Administered 2018-06-30: 10 mL via INTRA_ARTERIAL

## 2018-06-30 MED ORDER — INSULIN ASPART 100 UNIT/ML ~~LOC~~ SOLN
0.0000 [IU] | Freq: Three times a day (TID) | SUBCUTANEOUS | Status: DC
  Administered 2018-07-01 – 2018-07-03 (×7): 2 [IU] via SUBCUTANEOUS

## 2018-06-30 MED ORDER — ASPIRIN 81 MG PO CHEW
247.0000 mg | CHEWABLE_TABLET | Freq: Once | ORAL | Status: AC
  Administered 2018-06-30: 243 mg via ORAL
  Filled 2018-06-30: qty 3

## 2018-06-30 MED ORDER — HYDRALAZINE HCL 20 MG/ML IJ SOLN
5.0000 mg | INTRAMUSCULAR | Status: AC | PRN
  Administered 2018-06-30: 5 mg via INTRAVENOUS
  Filled 2018-06-30: qty 1

## 2018-06-30 MED ORDER — LOSARTAN POTASSIUM 50 MG PO TABS
100.0000 mg | ORAL_TABLET | Freq: Every day | ORAL | Status: DC
  Administered 2018-07-01 – 2018-07-04 (×4): 100 mg via ORAL
  Filled 2018-06-30 (×4): qty 2

## 2018-06-30 MED ORDER — NITROGLYCERIN 1 MG/10 ML FOR IR/CATH LAB
INTRA_ARTERIAL | Status: AC
  Filled 2018-06-30: qty 10

## 2018-06-30 MED ORDER — HYDROCODONE-ACETAMINOPHEN 7.5-325 MG PO TABS
1.0000 | ORAL_TABLET | Freq: Four times a day (QID) | ORAL | Status: DC | PRN
  Administered 2018-07-01: 1 via ORAL
  Filled 2018-06-30: qty 1

## 2018-06-30 MED ORDER — HEPARIN (PORCINE) IN NACL 1000-0.9 UT/500ML-% IV SOLN
INTRAVENOUS | Status: AC
  Filled 2018-06-30: qty 1000

## 2018-06-30 MED ORDER — HEPARIN (PORCINE) IN NACL 1000-0.9 UT/500ML-% IV SOLN
INTRAVENOUS | Status: DC | PRN
  Administered 2018-06-30 (×2): 500 mL

## 2018-06-30 MED ORDER — HEPARIN (PORCINE) IN NACL 100-0.45 UNIT/ML-% IJ SOLN
1100.0000 [IU]/h | INTRAMUSCULAR | Status: DC
  Administered 2018-06-30: 1100 [IU]/h via INTRAVENOUS
  Filled 2018-06-30: qty 250

## 2018-06-30 MED ORDER — ALUM & MAG HYDROXIDE-SIMETH 200-200-20 MG/5ML PO SUSP: 30.0000 mL | ORAL | Status: DC | PRN

## 2018-06-30 MED ORDER — SODIUM CHLORIDE 0.9% FLUSH
3.0000 mL | Freq: Two times a day (BID) | INTRAVENOUS | Status: DC
  Administered 2018-06-30 – 2018-07-04 (×6): 3 mL via INTRAVENOUS

## 2018-06-30 MED ORDER — FENTANYL CITRATE (PF) 100 MCG/2ML IJ SOLN
INTRAMUSCULAR | Status: AC
  Filled 2018-06-30: qty 2

## 2018-06-30 MED ORDER — ACETAMINOPHEN 325 MG PO TABS
650.0000 mg | ORAL_TABLET | ORAL | Status: DC | PRN
  Administered 2018-06-30 – 2018-07-04 (×4): 650 mg via ORAL
  Filled 2018-06-30 (×6): qty 2

## 2018-06-30 MED ORDER — LIDOCAINE HCL (PF) 1 % IJ SOLN
INTRAMUSCULAR | Status: AC
  Filled 2018-06-30: qty 30

## 2018-06-30 MED ORDER — HYDRALAZINE HCL 20 MG/ML IJ SOLN
INTRAMUSCULAR | Status: AC
  Filled 2018-06-30: qty 1

## 2018-06-30 MED ORDER — NITROGLYCERIN 1 MG/10 ML FOR IR/CATH LAB
INTRA_ARTERIAL | Status: DC | PRN
  Administered 2018-06-30: 200 ug via INTRACORONARY

## 2018-06-30 MED ORDER — HYDRALAZINE HCL 20 MG/ML IJ SOLN
10.0000 mg | Freq: Once | INTRAMUSCULAR | Status: AC
  Administered 2018-06-30: 10 mg via INTRAVENOUS
  Filled 2018-06-30: qty 1

## 2018-06-30 MED ORDER — LIDOCAINE HCL (PF) 1 % IJ SOLN
INTRAMUSCULAR | Status: DC | PRN
  Administered 2018-06-30: 2 mL

## 2018-06-30 MED ORDER — LABETALOL HCL 5 MG/ML IV SOLN: 10.0000 mg | INTRAVENOUS | Status: AC | PRN

## 2018-06-30 MED ORDER — IOHEXOL 350 MG/ML SOLN
INTRAVENOUS | Status: DC | PRN
  Administered 2018-06-30: 90 mL

## 2018-06-30 MED ORDER — HEPARIN SODIUM (PORCINE) 1000 UNIT/ML IJ SOLN
INTRAMUSCULAR | Status: DC | PRN
  Administered 2018-06-30: 5000 [IU] via INTRAVENOUS
  Administered 2018-06-30: 4500 [IU] via INTRAVENOUS

## 2018-06-30 MED ORDER — AMLODIPINE BESYLATE 5 MG PO TABS
5.0000 mg | ORAL_TABLET | Freq: Every day | ORAL | Status: DC
  Administered 2018-06-30 – 2018-07-04 (×5): 5 mg via ORAL
  Filled 2018-06-30 (×5): qty 1

## 2018-06-30 MED ORDER — SODIUM CHLORIDE 0.9 % IV SOLN
INTRAVENOUS | Status: DC
  Administered 2018-06-30 – 2018-07-02 (×4): via INTRAVENOUS
  Administered 2018-07-03: 1000 mL via INTRAVENOUS

## 2018-06-30 MED ORDER — SODIUM CHLORIDE 0.9% FLUSH
3.0000 mL | INTRAVENOUS | Status: DC | PRN
  Administered 2018-07-03: 3 mL via INTRAVENOUS
  Filled 2018-06-30: qty 3

## 2018-06-30 MED ORDER — NITROGLYCERIN 0.4 MG SL SUBL: 0.4000 mg | SUBLINGUAL_TABLET | SUBLINGUAL | Status: DC | PRN

## 2018-06-30 MED ORDER — METOPROLOL TARTRATE 25 MG PO TABS
50.0000 mg | ORAL_TABLET | Freq: Two times a day (BID) | ORAL | Status: DC
  Administered 2018-06-30 – 2018-07-04 (×8): 50 mg via ORAL
  Filled 2018-06-30 (×5): qty 1
  Filled 2018-06-30 (×3): qty 2

## 2018-06-30 MED ORDER — SODIUM CHLORIDE 0.9 % IV SOLN: 250.0000 mL | INTRAVENOUS | Status: DC | PRN

## 2018-06-30 MED ORDER — METOPROLOL TARTRATE 50 MG PO TABS: 50.0000 mg | ORAL_TABLET | Freq: Two times a day (BID) | ORAL | Status: DC

## 2018-06-30 MED ORDER — HEPARIN (PORCINE) IN NACL 100-0.45 UNIT/ML-% IJ SOLN
1600.0000 [IU]/h | INTRAMUSCULAR | Status: DC
  Administered 2018-06-30: 1000 [IU]/h via INTRAVENOUS
  Administered 2018-07-01: 1300 [IU]/h via INTRAVENOUS
  Administered 2018-07-02 (×2): 1600 [IU]/h via INTRAVENOUS
  Filled 2018-06-30 (×3): qty 250

## 2018-06-30 SURGICAL SUPPLY — 12 items
CATH 5FR JL3.5 JR4 ANG PIG MP (CATHETERS) ×2 IMPLANT
CATH LAUNCHER 6FR EBU3.5 (CATHETERS) ×2 IMPLANT
DEVICE RAD COMP TR BAND LRG (VASCULAR PRODUCTS) ×2 IMPLANT
GLIDESHEATH SLEND SS 6F .021 (SHEATH) ×2 IMPLANT
GUIDEWIRE INQWIRE 1.5J.035X260 (WIRE) ×1 IMPLANT
GUIDEWIRE PRESSURE COMET II (WIRE) ×2 IMPLANT
INQWIRE 1.5J .035X260CM (WIRE) ×2
KIT ESSENTIALS PG (KITS) ×2 IMPLANT
KIT HEART LEFT (KITS) ×2 IMPLANT
PACK CARDIAC CATHETERIZATION (CUSTOM PROCEDURE TRAY) ×2 IMPLANT
TRANSDUCER W/STOPCOCK (MISCELLANEOUS) ×2 IMPLANT
TUBING CIL FLEX 10 FLL-RA (TUBING) ×2 IMPLANT

## 2018-06-30 NOTE — Progress Notes (Signed)
ANTICOAGULATION CONSULT NOTE - Initial Consult  Pharmacy Consult for heparin Indication: chest pain/ACS  No Known Allergies  Patient Measurements: Weight: 200 lb (90.7 kg) Heparin Dosing Weight: 87 kg  Vital Signs: Temp: 98.2 F (36.8 C) (10/18 0821) Temp Source: Oral (10/18 0821) BP: 177/90 (10/18 1000) Pulse Rate: 62 (10/18 0903)  Labs: Recent Labs    06/30/18 0828  HGB 13.9  HCT 42.9  PLT 156  CREATININE 1.03  TROPONINI <0.03    Estimated Creatinine Clearance: 80.3 mL/min (by C-G formula based on SCr of 1.03 mg/dL).   Medical History: Past Medical History:  Diagnosis Date  . CAD in native artery    a. 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 DES to left PDA 09/20/17  . Diabetes mellitus without complication ([place])   . High cholesterol   . Hyperlipidemia   . Hypertension   . Insomnia   . Pulmonary hypertension ([place])    a. TTE 03/25/17; EF 60-65%, nl WM, LV diastolic fxn nl, trivial AI, RV cavity size, wall thickness, and sys fxn nl, trivial TR, PASP 39 mmHg, trivial pericardial effusion  . Sleep apnea    could not use a cpap  . Wears dentures    full top-partial bottom  . Wears glasses     Medications:   (Not in a hospital admission)  Assessment: Pharmacy consulted to dose heparin in patient with chest pain/ACS.  Goal of Therapy:  Heparin level 0.3-0.7 units/ml Monitor platelets by anticoagulation protocol: Yes   Plan:  Give 4000 units units bolus x 1 Start heparin infusion at 1100 units/hr Check anti-Xa level in 6-8 hours and daily while on heparin Continue to monitor H&H and platelets  Remo Lipps C Rex Oesterle 06/30/2018,11:21 AM

## 2018-06-30 NOTE — Brief Op Note (Signed)
BRIEF CARDIAC CATHETERIZATION NOTE  DATE: 06/30/2018 TIME: 2:58 PM  PATIENT:  [name] Foster  62 y.o. male  PRE-OPERATIVE DIAGNOSIS:  Unstable angina  POST-OPERATIVE DIAGNOSIS:  Same  PROCEDURE:  Procedure(s): LEFT HEART CATH AND CORONARY ANGIOGRAPHY (N/A) INTRAVASCULAR PRESSURE WIRE/FFR STUDY (N/A)  SURGEON:  Surgeon(s) and Role:    Nelva Bush, MD - Primary  FINDINGS: 1. Significant mid LAD disease (70%; DFR 0.71). 2. Diffuse moderate to severe diagonal disease. 3. Patent stents in LCx/OM and lPDA. 4. Stable 50% mid RCA stenosis (non-dominant). 5. Normal LVEF and LVEDP.  RECOMMENDATIONS: 1. Plan for orbital atherectomy and PCI to heavily calcified mid LAD stenosis on Monday. 2. Restart heparin infusion 2 hours after TR band removal. 3. Continue DAPT with aspirin and clopidogrel. 4. Aggressive secondary prevention.  Nelva Bush, MD [facility] Pager: [phone]

## 2018-06-30 NOTE — Progress Notes (Signed)
ANTICOAGULATION CONSULT NOTE - Initial Consult  Pharmacy Consult for heparin Indication: chest pain/ACS  No Known Allergies  Patient Measurements: Weight: 200 lb (90.7 kg) Heparin Dosing Weight: 87kg  Vital Signs: Temp: 98.2 F (36.8 C) (10/18 0821) Temp Source: Oral (10/18 0821) BP: 169/75 (10/18 1700) Pulse Rate: 77 (10/18 1700)  Labs: Recent Labs    06/30/18 0828 06/30/18 1115 06/30/18 1129 06/30/18 1546  HGB 13.9  --   --   --   HCT 42.9  --   --   --   PLT 156  --   --   --   APTT  --   --  27  --   CREATININE 1.03  --   --   --   TROPONINI <0.03 <0.03  --  <0.03    Estimated Creatinine Clearance: 80.3 mL/min (by C-G formula based on SCr of 1.03 mg/dL).   Medical History: Past Medical History:  Diagnosis Date  . CAD in native artery    a. 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 DES to left PDA 09/20/17  . Diabetes mellitus without complication ([place])   . High cholesterol   . Hyperlipidemia   . Hypertension   . Insomnia   . Pulmonary hypertension ([place])    a. TTE 03/25/17; EF 60-65%, nl WM, LV diastolic fxn nl, trivial AI, RV cavity size, wall thickness, and sys fxn nl, trivial TR, PASP 39 mmHg, trivial pericardial effusion  . Sleep apnea    could not use a cpap  . Wears dentures    full top-partial bottom  . Wears glasses      Assessment: 20 yoM s/p LHC with heavily calcified multivessel disease. Pharmacy consulted to resume IV heparin 2-hr after TR band removed (~1830 per RN) with plans for staged PCI and atherectomy on 10/21.  Goal of Therapy:  Heparin level 0.3-0.7 units/ml Monitor platelets by anticoagulation protocol: Yes   Plan:  -Heparin 1000 units/hr to resume at 2030 -Check heparin level with am labs  Arrie Senate, PharmD, BCPS Clinical Pharmacist [phone] Please check AMION for all [place]  numbers 06/30/2018

## 2018-06-30 NOTE — ED Notes (Signed)
Pt transported by [facility]. Report called to cath lab

## 2018-06-30 NOTE — ED Provider Notes (Signed)
[facility] EMERGENCY DEPARTMENT Provider Note   CSN: [id number] Arrival date & time: 06/30/18  5170     History   Chief Complaint Chief Complaint  Patient presents with  . Chest Pain    HPI [name] Foster is a 62 y.o. male with a history as outlined below, most significant for history of CAD with an MI July 2018 with stenting, with recurrent cath February 2019 with an additional stent placement presenting with a 1 day history of intermittent left-sided chest pain which has responded to nitroglycerin.  He was driving home from the beach yesterday when he developed sharp left-sided pain with radiation into his left arm.  This was accompanied by mild nausea and shortness of breath.  Once home he took a nitroglycerin his symptoms resolved.  He woke again at 2 AM today with similar symptoms this time also involving diaphoresis, he took a nitroglycerin around 430 which improved but did not completely resolve his pain.  He also took his other morning medicines an hour before arrival including his metoprolol, Plavix, his baby aspirin and his Cozaar.  His symptom is currently described as sharp and very localized to his left upper chest without radiation.  He does report feeling a little abdominal bloating, denies abdominal pain.  The history is provided by the patient.    Past Medical History:  Diagnosis Date  . CAD in native artery    a. 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 DES to left PDA 09/20/17  . Diabetes mellitus without complication ([place])   . High cholesterol   . Hyperlipidemia   . Hypertension   . Insomnia   . Pulmonary hypertension ([place])    a. TTE 03/25/17; EF 60-65%, nl WM, LV diastolic fxn nl, trivial AI, RV cavity size, wall thickness, and sys fxn nl, trivial TR, PASP 39 mmHg, trivial pericardial effusion  . Sleep apnea    could not use a cpap  .  Wears dentures    full top-partial bottom  . Wears glasses     Patient Active Problem List   Diagnosis Date Noted  . Type 1 diabetes mellitus without complication ([place]) 01/74/9449  . Angina, class III ([place]) 09/20/2017  . Type 2 diabetes mellitus with complication ([place]) 67/59/1638  . History of ST elevation myocardial infarction (STEMI) 03/25/2017  . Coronary artery disease involving native coronary artery of native heart with unstable angina pectoris ([employer]) 03/25/2017  . Presence of drug-eluting stent in left circumflex coronary artery 03/25/2017  . Acute hypoxemic respiratory failure ([place])   . Hypokalemia   . Acute encephalopathy   . Ganglion cyst of wrist 07/25/2014  . Essential hypertension 12/01/2010  . Sleep apnea 12/01/2010  . Abnormal EKG 12/01/2010  . Dyslipidemia, goal LDL below 70 12/01/2010  . Shortness of breath 12/01/2010    Past Surgical History:  Procedure Laterality Date  . COLONOSCOPY    . CORONARY STENT INTERVENTION N/A 09/20/2017   Procedure: CORONARY STENT INTERVENTION;  Surgeon: Leonie Man, MD;  Location: [place] [facility];  Service: Cardiovascular;  Laterality: N/A;  . CORONARY/GRAFT ACUTE MI REVASCULARIZATION N/A 03/25/2017   Procedure: Coronary/Graft Acute MI Revascularization;  Surgeon: Sherren Mocha, MD;  Location: [place] [facility];  Service: Cardiovascular;  Laterality: N/A;  . GANGLION CYST EXCISION Left 07/25/2014   Procedure: EXCISION VOLAR AND DORSAL GANGLION LEFT WRIST;  Surgeon:  Leanora Cover, MD;  Location: [place];  Service: Orthopedics;  Laterality: Left;  . INTRAVASCULAR PRESSURE WIRE/FFR STUDY N/A 09/20/2017   Procedure: INTRAVASCULAR PRESSURE WIRE/FFR STUDY;  Surgeon: Leonie Man, MD;  Location: [place] [facility];  Service: Cardiovascular;  Laterality: N/A;  . KNEE ARTHROSCOPY     rifgr and left  . LEFT HEART CATH AND CORONARY ANGIOGRAPHY N/A 03/25/2017   Procedure: Left Heart Cath and Coronary Angiography;   Surgeon: Sherren Mocha, MD;  Location: [place] [facility];  Service: Cardiovascular;  Laterality: N/A;  . LEFT HEART CATH AND CORONARY ANGIOGRAPHY N/A 09/20/2017   Procedure: LEFT HEART CATH AND CORONARY ANGIOGRAPHY;  Surgeon: Leonie Man, MD;  Location: [place] [facility];  Service: Cardiovascular;  Laterality: N/A;  . SHOULDER ARTHROSCOPY  2012   left  . SHOULDER ARTHROSCOPY     right  . TEMPORARY PACEMAKER N/A 03/25/2017   Procedure: Temporary Pacemaker;  Surgeon: Sherren Mocha, MD;  Location: [place] [facility];  Service: Cardiovascular;  Laterality: N/A;  . TONSILLECTOMY    . TOTAL KNEE ARTHROPLASTY  2002   left        Home Medications    Prior to Admission medications   Medication Sig Start Date End Date Taking? Authorizing Provider  aspirin 81 MG EC tablet TAKE 1 TABLET BY MOUTH EVERY DAY 03/14/18  Yes Dunn, Ryan M, PA-C  atorvastatin (LIPITOR) 80 MG tablet TAKE 1 TABLET (80 MG TOTAL) BY MOUTH DAILY AT 6 PM. 03/06/18  Yes Sherren Mocha, MD  clopidogrel (PLAVIX) 75 MG tablet Take 1 tablet (75 mg total) by mouth daily. 02/13/18 02/08/19 Yes Sherren Mocha, MD  HYDROcodone-acetaminophen (NORCO) 7.5-325 MG tablet Take 1 tablet by mouth every 6 (six) hours as needed for moderate pain.   Yes [provider]  isosorbide mononitrate (IMDUR) 30 MG 24 hr tablet Take 0.5 tablets (15 mg total) by mouth daily. 09/16/17 09/11/18 Yes Sherren Mocha, MD  losartan (COZAAR) 100 MG tablet Take 1 tablet (100 mg total) by mouth daily. 02/13/18 02/08/19 Yes Sherren Mocha, MD  metFORMIN (GLUCOPHAGE) 1000 MG tablet Take 500 mg by mouth 2 (two) times daily with a meal. 08/15/17  Yes [provider]  metoprolol tartrate (LOPRESSOR) 50 MG tablet Take 1 tablet (50 mg total) by mouth 2 (two) times daily. Patient taking differently: Take 50-100 mg by mouth 2 (two) times daily. Take one tablet in the morning and two tablets at bedtime. 12/07/17 12/02/18 Yes Sherren Mocha, MD  nitroGLYCERIN  (NITROSTAT) 0.4 MG SL tablet Place 1 tablet (0.4 mg total) under the tongue every 5 (five) minutes x 3 doses as needed for chest pain. 03/27/17  Yes Dunn, Areta Haber, PA-C  zolpidem (AMBIEN) 10 MG tablet Take 10 mg by mouth at bedtime as needed for sleep.    Yes [provider]    Family History Family History  Problem Relation Age of Onset  . Hypertension Mother   . Hypertension Father   . Hypertension Brother     Social History Social History   Tobacco Use  . Smoking status: Former Smoker    Packs/day: 1.00    Last attempt to quit: 03/25/2017    Years since quitting: 1.2  . Smokeless tobacco: Never Used  Substance Use Topics  . Alcohol use: Yes    Alcohol/week: 3.0 standard drinks    Types: 1 Glasses of wine, 1 Cans of beer, 1 Shots of liquor per week    Comment: drinks daily  beer and whiskey  . Drug use: No     Allergies   Patient has no known allergies.   Review of Systems Review of Systems  Constitutional: Positive for diaphoresis. Negative for fever.  HENT: Negative for congestion and sore throat.   Eyes: Negative.   Respiratory: Negative for chest tightness and shortness of breath.   Cardiovascular: Positive for chest pain.  Gastrointestinal: Positive for nausea. Negative for abdominal pain and vomiting.  Genitourinary: Negative.   Musculoskeletal: Negative for arthralgias, joint swelling and neck pain.  Skin: Negative.  Negative for rash and wound.  Neurological: Negative for dizziness, weakness, light-headedness, numbness and headaches.  Psychiatric/Behavioral: Negative.      Physical Exam Updated Vital Signs BP (!) 177/90   Pulse 62   Temp 98.2 F (36.8 C) (Oral)   Resp 16   Wt 90.7 kg   SpO2 97%   BMI 30.41 kg/m   Physical Exam  Constitutional: He appears well-developed and well-nourished.  HENT:  Head: Normocephalic and atraumatic.  Eyes: Conjunctivae are normal.  Neck: Normal range of motion.  Cardiovascular: Normal rate, regular  rhythm, normal heart sounds and intact distal pulses.  Pulmonary/Chest: Effort normal and breath sounds normal. He has no decreased breath sounds. He has no wheezes.  Abdominal: Soft. Bowel sounds are normal. There is no tenderness.  Musculoskeletal: Normal range of motion.  Neurological: He is alert.  Skin: Skin is warm and dry.  Psychiatric: He has a normal mood and affect.  Nursing note and vitals reviewed.    ED Treatments / Results  Labs (all labs ordered are listed, but only abnormal results are displayed) Labs Reviewed  BASIC METABOLIC PANEL - Abnormal; Notable for the following components:      Result Value   Glucose, Bld 186 (*)    All other components within normal limits  CBC  TROPONIN I    EKG EKG Interpretation  Date/Time:  Friday June 30 2018 08:22:51 EDT Ventricular Rate:  57 PR Interval:    QRS Duration: 99 QT Interval:  442 QTC Calculation: 431 R Axis:   62 Text Interpretation:  Sinus rhythm No significant change since last tracing Confirmed by Orlie Dakin [phone]) on 06/30/2018 8:27:31 AM   Radiology Dg Chest 2 View  Result Date: 06/30/2018 CLINICAL DATA:  Left-sided chest pain and mild shortness-of-breath since yesterday. EXAM: CHEST - 2 VIEW COMPARISON:  03/25/2017 FINDINGS: Lungs are adequately inflated without focal airspace consolidation or effusion. Minimal prominence of the central bronchovascular markings which may be due to mild degree of vascular congestion. Cardiomediastinal silhouette and remainder the exam is unchanged. IMPRESSION: Possible mild degree of vascular congestion. Electronically Signed   By: Marin Olp M.D.   On: 06/30/2018 09:21    Procedures Procedures (including critical care time)  Medications Ordered in ED Medications  nitroGLYCERIN (NITROSTAT) SL tablet 0.4 mg (0.4 mg Sublingual Given 06/30/18 0908)  aspirin chewable tablet 243 mg (243 mg Oral Given 06/30/18 0902)     Initial Impression / Assessment and  Plan / ED Course  I have reviewed the triage vital signs and the nursing notes.  Pertinent labs & imaging results that were available during my care of the patient were reviewed by me and considered in my medical decision making (see chart for details).     Labs, imaging and EKG reviewed and discussed with patient.  We suspect he has unstable angina.  Discussed with Dr. Harl Bowie who will see the patient in the ED and plan dispel.  Patient will be admitted for further evaluation, Dr. Harl Bowie will determine whether he can stay here or whether he should be transferred to  [facility].  Final Clinical Impressions(s) / ED Diagnoses   Final diagnoses:  Unstable angina [facility])    ED Discharge Orders    None       Landis Martins 06/30/18 [place], MD 06/30/18 1616

## 2018-06-30 NOTE — H&P (Addendum)
Cardiology History and Physical    Patient ID: [name] Foster; [id number]; Mar 27, 1956   Admit date: 06/30/2018 Date of Admission: 06/30/2018  Primary Care Provider: [name], DO Primary Cardiologist: Sherren Mocha, MD    Patient Profile    [name] Foster is a 62 y.o. male with past medical history of CAD (s/p inferior STEMI in 03/2017 with DES to LCx, DES to PDA and PTCA alone to Ostial PDA in 09/2017, residual 65% Proximal LAD stenosis not significant by FFR), HTN, HLD, and Type 2 DM who is being seen today for the evaluation of chest pain at the request of Dr. Winfred Leeds.   History of Present Illness    [name] Foster was last examined by Dr. Burt Knack in 02/2018 and denied any recurrent chest pain at that time. Was still having episodes of shortness of breath after taking his medications, therefore he was switched from Brilinta to Plavix 75mg  daily and continued on ASA 81mg  daily.   In talking with the patient today, he reports being in his usual state of health over the past few weeks and traveled to [place] on Tuesday to help his daughter move furniture. He denies any actual anginal symptoms during this process but starting the day after, he developed sharp chest discomfort along his left pectoral region. Symptoms presented intermittently throughout the day and were relieved with sublingual nitroglycerin. He reports associated dyspnea with this. Symptoms were not worse with palpation or positional changes. Last night, he was awoken from sleep with recurrent chest discomfort, dyspnea, and diaphoresis. He again took sublingual nitroglycerin with resolution of his pain. Reports symptoms are identical to what he experienced in 03/2017 at the time of his MI. Has been compliant with his medication regimen and denies missing any recent doses.  Initial labs show WBC 4.6, Hgb 13.9, platelets 156, Na+ 141, K+ 4.2, and creatinine 1.03. Initial troponin value negative.  CXR shows mild  degree of vascular congestion. EKG shows sinus bradycardia, heart rate 57, with no acute ST changes when compared to prior tracings.   Past Medical History:  Diagnosis Date  . CAD in native artery    a. 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 DES to left PDA 09/20/17  . Diabetes mellitus without complication ([place])   . High cholesterol   . Hyperlipidemia   . Hypertension   . Insomnia   . Pulmonary hypertension ([place])    a. TTE 03/25/17; EF 60-65%, nl WM, LV diastolic fxn nl, trivial AI, RV cavity size, wall thickness, and sys fxn nl, trivial TR, PASP 39 mmHg, trivial pericardial effusion  . Sleep apnea    could not use a cpap  . Wears dentures    full top-partial bottom  . Wears glasses     Past Surgical History:  Procedure Laterality Date  . COLONOSCOPY    . CORONARY STENT INTERVENTION N/A 09/20/2017   Procedure: CORONARY STENT INTERVENTION;  Surgeon: Leonie Man, MD;  Location: [facility];  Service: Cardiovascular;  Laterality: N/A;  . CORONARY/GRAFT ACUTE MI REVASCULARIZATION N/A 03/25/2017   Procedure: Coronary/Graft Acute MI Revascularization;  Surgeon: Sherren Mocha, MD;  Location: [facility];  Service: Cardiovascular;  Laterality: N/A;  . GANGLION CYST EXCISION Left 07/25/2014   Procedure: EXCISION VOLAR AND DORSAL GANGLION LEFT WRIST;  Surgeon: Leanora Cover, MD;  Location: [facility];  Service: Orthopedics;  Laterality: Left;  . INTRAVASCULAR PRESSURE WIRE/FFR STUDY N/A 09/20/2017   Procedure: INTRAVASCULAR PRESSURE WIRE/FFR STUDY;  Surgeon: Leonie Man, MD;  Location: [facility];  Service: Cardiovascular;  Laterality: N/A;  . KNEE ARTHROSCOPY     rifgr and left  . LEFT HEART CATH AND CORONARY ANGIOGRAPHY N/A 03/25/2017   Procedure: Left Heart Cath and Coronary Angiography;  Surgeon: Sherren Mocha, MD;   Location: [facility];  Service: Cardiovascular;  Laterality: N/A;  . LEFT HEART CATH AND CORONARY ANGIOGRAPHY N/A 09/20/2017   Procedure: LEFT HEART CATH AND CORONARY ANGIOGRAPHY;  Surgeon: Leonie Man, MD;  Location: [facility];  Service: Cardiovascular;  Laterality: N/A;  . SHOULDER ARTHROSCOPY  2012   left  . SHOULDER ARTHROSCOPY     right  . TEMPORARY PACEMAKER N/A 03/25/2017   Procedure: Temporary Pacemaker;  Surgeon: Sherren Mocha, MD;  Location: [facility];  Service: Cardiovascular;  Laterality: N/A;  . TONSILLECTOMY    . TOTAL KNEE ARTHROPLASTY  2002   left     Home Medications:  Prior to Admission medications   Medication Sig Start Date End Date Taking? Authorizing Provider  aspirin 81 MG EC tablet TAKE 1 TABLET BY MOUTH EVERY DAY 03/14/18   Rise Mu, PA-C  atorvastatin (LIPITOR) 80 MG tablet TAKE 1 TABLET (80 MG TOTAL) BY MOUTH DAILY AT 6 PM. 03/06/18   Sherren Mocha, MD  clopidogrel (PLAVIX) 75 MG tablet Take 1 tablet (75 mg total) by mouth daily. 02/13/18 02/08/19  Sherren Mocha, MD  HYDROcodone-acetaminophen (NORCO) 7.5-325 MG tablet Take 1 tablet by mouth every 6 (six) hours as needed for moderate pain.    [provider]  isosorbide mononitrate (IMDUR) 30 MG 24 hr tablet Take 0.5 tablets (15 mg total) by mouth daily. 09/16/17 09/11/18  Sherren Mocha, MD  losartan (COZAAR) 100 MG tablet Take 1 tablet (100 mg total) by mouth daily. 02/13/18 02/08/19  Sherren Mocha, MD  metFORMIN (GLUCOPHAGE) 1000 MG tablet Take 500 mg by mouth 2 (two) times daily with a meal. 08/15/17   [provider]  metoprolol tartrate (LOPRESSOR) 50 MG tablet Take 1 tablet (50 mg total) by mouth 2 (two) times daily. 12/07/17 12/02/18  Sherren Mocha, MD  nitroGLYCERIN (NITROSTAT) 0.4 MG SL tablet Place 1 tablet (0.4 mg total) under the tongue every 5 (five) minutes x 3 doses as needed for chest pain. 03/27/17   Dunn, Areta Haber, PA-C  zolpidem (AMBIEN) 10 MG tablet  Take 10 mg by mouth at bedtime as needed for sleep.     [provider]    Inpatient Medications: Scheduled Meds:  Continuous Infusions:  PRN Meds: nitroGLYCERIN  Allergies:   No Known Allergies  Social History:   Social History   Socioeconomic History  . Marital status: Single    Spouse name: Not on file  . Number of children: Not on file  . Years of education: Not on file  . Highest education level: Not on file  Occupational History  . Occupation: Retired  Scientific laboratory technician  . Financial resource strain: Not on file  . Food insecurity:    Worry: Not on file    Inability: Not on file  . Transportation needs:    Medical: Not on file    Non-medical: Not on file  Tobacco Use  . Smoking status: Former Smoker    Packs/day: 1.00    Last attempt to quit: 03/25/2017  Years since quitting: 1.2  . Smokeless tobacco: Never Used  Substance and Sexual Activity  . Alcohol use: Yes    Alcohol/week: 3.0 standard drinks    Types: 1 Glasses of wine, 1 Cans of beer, 1 Shots of liquor per week    Comment: drinks daily  beer and whiskey  . Drug use: No  . Sexual activity: Not on file  Lifestyle  . Physical activity:    Days per week: Not on file    Minutes per session: Not on file  . Stress: Not on file  Relationships  . Social connections:    Talks on phone: Not on file    Gets together: Not on file    Attends religious service: Not on file    Active member of club or organization: Not on file    Attends meetings of clubs or organizations: Not on file    Relationship status: Not on file  . Intimate partner violence:    Fear of current or ex partner: Not on file    Emotionally abused: Not on file    Physically abused: Not on file    Forced sexual activity: Not on file  Other Topics Concern  . Not on file  Social History Narrative   Per wife, pt was preparing to retire soon from his job at [employer].     Family History:    Family History  Problem Relation Age of Onset   . Hypertension Mother   . Hypertension Father   . Hypertension Brother       Review of Systems    General:  No chills, fever, night sweats or weight changes.  Cardiovascular:  No edema, orthopnea, palpitations, paroxysmal nocturnal dyspnea. Positive for chest pain and dyspnea.  Dermatological: No rash, lesions/masses Respiratory: No cough, dyspnea Urologic: No hematuria, dysuria Abdominal:   No nausea, vomiting, diarrhea, bright red blood per rectum, melena, or hematemesis Neurologic:  No visual changes, wkns, changes in mental status. All other systems reviewed and are otherwise negative except as noted above.  Physical Exam/Data    Vitals:   06/30/18 0900 06/30/18 0903 06/30/18 0907 06/30/18 1000  BP: (!) 203/90 (!) 203/90 (!) 171/92 (!) 177/90  Pulse:  62    Resp: 14 17 18 16   Temp:      TempSrc:      SpO2: 95% 99% 95% 97%  Weight:       No intake or output data in the 24 hours ending 06/30/18 1019 Filed Weights   06/30/18 0819  Weight: 90.7 kg   Body mass index is 30.41 kg/m.   General: Pleasant, Caucasian male appearing in NAD Psych: Normal affect. Neuro: Alert and oriented X 3. Moves all extremities spontaneously. HEENT: Normal  Neck: Supple without bruits or JVD. Lungs:  Resp regular and unlabored, CTA without wheezing or rales. Heart: RRR no s3, s4, or murmurs. Abdomen: Soft, non-tender, non-distended, BS + x 4.  Extremities: No clubbing, cyanosis or edema. DP/PT/Radials 2+ and equal bilaterally.   EKG:  The EKG was personally reviewed and demonstrates: Sinus bradycardia, heart rate 57, with no acute ST changes when compared to prior tracings.  Telemetry:  Telemetry was personally reviewed and demonstrates: Sinus bradycardia, HR in 50's to 60's.    Labs/Studies     Relevant CV Studies:  Cardiac Catheterization: 09/2017     LPDA lesion is 90% stenosed. Previously nonvisualized vessel.  A drug-eluting stent was successfully placed using a STENT  SYNERGY DES 2.25X28. --> Postdilated  2.4 mm  Post intervention, there e is a 0% residual stenosis.  Ost LPDA lesion is 60% stenosed. Vessel not previously visualized during an index PCI case. -- Treated with PTCA alone  Balloon angioplasty was performed using a BALLOON SAPPHIRE 2.0X15. Post intervention, there is a 40% residual stenosis.  Previously placed Mid Cx stent (unknown type) is widely patent.  Prox LAD lesion is 65% stenosed. FFR NOT Significant (0.83)  Ost 2nd Mrg to 2nd Mrg lesion is 60% stenosed.  Non-dominant vessel Mid RCA lesion is 50% stenosed with 50% stenosed side [place] Nish in Acute Mrg.  The left ventricular systolic function is normal. The left ventricular ejection fraction is 55-65% by visual estimate.   Successful PCI of left posterior descending artery which was not previously visualized using a Synergy DES stent. Mild PTCA of the ostium of the PDA to allow for stent advancement.  Moderate residual disease noted.  Moderate severe disease in the proximal-midportion of the LAD but FFR not quite significant (0.83).  Otherwise preserved LVEF.  No other obvious culprit lesions noted.  Plan: Relatively stable post PCI.  He should be stable for same day discharge.  He is already on aspirin and Brilinta  We will continue home medications  Hold metformin for 48 hours post PCI  Follow-up with Dr. Burt Knack as directed.    Laboratory Data:  Chemistry Recent Labs  Lab 06/30/18 0828  NA 141  K 4.2  CL 107  CO2 27  GLUCOSE 186*  BUN 13  CREATININE 1.03  CALCIUM 9.3  GFRNONAA >60  GFRAA >60  ANIONGAP 7    No results for input(s): PROT, ALBUMIN, AST, ALT, ALKPHOS, BILITOT in the last 168 hours. Hematology Recent Labs  Lab 06/30/18 0828  WBC 4.6  RBC 4.83  HGB 13.9  HCT 42.9  MCV 88.8  MCH 28.8  MCHC 32.4  RDW 14.3  PLT 156   Cardiac Enzymes Recent Labs  Lab 06/30/18 0828  TROPONINI <0.03   No results for input(s): TROPIPOC in the last  168 hours.  BNPNo results for input(s): BNP, PROBNP in the last 168 hours.  DDimer No results for input(s): DDIMER in the last 168 hours.  Radiology/Studies:  Dg Chest 2 View  Result Date: 06/30/2018 CLINICAL DATA:  Left-sided chest pain and mild shortness-of-breath since yesterday. EXAM: CHEST - 2 VIEW COMPARISON:  03/25/2017 FINDINGS: Lungs are adequately inflated without focal airspace consolidation or effusion. Minimal prominence of the central bronchovascular markings which may be due to mild degree of vascular congestion. Cardiomediastinal silhouette and remainder the exam is unchanged. IMPRESSION: Possible mild degree of vascular congestion. Electronically Signed   By: Marin Olp M.D.   On: 06/30/2018 09:21     Assessment & Plan    1. Chest Pain Concerning for Unstable Angina - presents with episodes of pain which started yesterday after exerting himself the day prior. Symptoms were intermittent throughout the day and resolved with SL NTG. Overnight, he was awoke from sleep with recurrent pain and reports associated dyspnea and diaphoresis at that time. Symptoms identical to his prior anginal symptoms.  - Initial troponin value. EKG shows sinus bradycardia, heart rate 57, with no acute ST changes when compared to prior tracings. Will cycle troponin values. Will review with Dr. Harl Bowie but given his presenting symptoms and known CAD, would anticipate a repeat cardiac catheterization later today. If no culprit lesion is identified, would check D-dimer as [place] the beach earlier this [place] but symptoms seem atypical  for a PE. He is currently pain-free but if symptoms represent, would start IV Heparin and IV NTG. Continue ASA, Plavix, statin, and BB therapy.   2. CAD - s/p inferior STEMI in 03/2017 with DES to LCx, DES to PDA and PTCA alone to Ostial PDA in 09/2017. Residual 65% Proximal LAD stenosis not significant by FFR. - continue ASA, Plavix, statin, and BB therapy.   3.  HTN - SBP is elevated in the 170's while in the ED. He just took his medications prior to coming to the ED. Will contine PTA Imdur 15mg  daily, Losartan 100mg  daily, and Lopressor 50mg  BID. Would not further titrate BB therapy given baseline bradycardia. May need to consider the addition of Amlodipine if BP remains elevated during admission. Can use PRN Hydralazine prior to cath.    4. HLD - recheck FLP. Continue Atorvastatin 80mg  daily.   5. Type 2 DM - hold Metformin in anticipation of cath. Start SSI.   For questions or updates, please contact [facility] Please consult [email or web address] for contact info under Cardiology/STEMI.  Signed, Erma Heritage, PA-C 06/30/2018, 10:19 AM Pager: [phone]  Attending note  Patient seen and discussed with PA Ahmed Prima, I agree with her documentation. 62 yo male history of CAD with prior interventions as reported above presents with chest pain concerning for unstable angina. Symptoms are similar to his prior angina and resolved with NG, first significant episodes since his last stent.  Initial troponin and EKG benign thus far, but based on his history and symptoms high concern for unstable angina and a heart cath to definitively evaluate and manage is the plan   Zandra Abts MD

## 2018-06-30 NOTE — ED Provider Notes (Signed)
Complains of left anterior chest pain into left arm onset yesterday afternoon while driving.  Pain feels like "heart pain" he has had in the past he treated himself with 1 sublingual nitroglycerin yesterday with relief.  He awakened this morning with similar pain.  He has not had any chest pain since cardiac stent placed January 2019.  He feels somewhat improved after 1 sublingual nitroglycerin administered here prior to my exam on exam alert no distress.  Clear to auscultation heart regular rate and rhythm abdomen nondistended nontender   Orlie Dakin, MD 06/30/18 [phone]

## 2018-06-30 NOTE — Interval H&P Note (Signed)
History and Physical Interval Note:  06/30/2018 1:32 PM  [name] Foster  has presented today for cardiac catheterization, with the diagnosis of unstable angina. The various methods of treatment have been discussed with the patient and family. After consideration of risks, benefits and other options for treatment, the patient has consented to  Procedure(s): LEFT HEART CATH AND CORONARY ANGIOGRAPHY (N/A) as a surgical intervention .  The patient's history has been reviewed, patient examined, no change in status, stable for surgery.  I have reviewed the patient's chart and labs.  Questions were answered to the patient's satisfaction.    Cath Lab Visit (complete for each Cath Lab visit)  Clinical Evaluation Leading to the Procedure:   ACS: Yes.    Non-ACS:  N/A  [name]

## 2018-06-30 NOTE — ED Triage Notes (Signed)
Pt reports experiencing CP yesterday while driving from [place]. Once he go home at 4pm he took a nitro and pain subsided. Pt states pain in left chest woke him up at 2 am and he took nitro at 4pm. Chest felt tight. Pt reports pain is left chest with some radiation to left arm Pt reports nausea and bloating

## 2018-07-01 DIAGNOSIS — Z87891 Personal history of nicotine dependence: Secondary | ICD-10-CM | POA: Diagnosis not present

## 2018-07-01 DIAGNOSIS — E7800 Pure hypercholesterolemia, unspecified: Secondary | ICD-10-CM | POA: Diagnosis present

## 2018-07-01 DIAGNOSIS — Z7982 Long term (current) use of aspirin: Secondary | ICD-10-CM | POA: Diagnosis not present

## 2018-07-01 DIAGNOSIS — Z955 Presence of coronary angioplasty implant and graft: Secondary | ICD-10-CM | POA: Diagnosis not present

## 2018-07-01 DIAGNOSIS — Z7902 Long term (current) use of antithrombotics/antiplatelets: Secondary | ICD-10-CM | POA: Diagnosis not present

## 2018-07-01 DIAGNOSIS — Z972 Presence of dental prosthetic device (complete) (partial): Secondary | ICD-10-CM | POA: Diagnosis not present

## 2018-07-01 DIAGNOSIS — E119 Type 2 diabetes mellitus without complications: Secondary | ICD-10-CM | POA: Diagnosis present

## 2018-07-01 DIAGNOSIS — Z973 Presence of spectacles and contact lenses: Secondary | ICD-10-CM | POA: Diagnosis not present

## 2018-07-01 DIAGNOSIS — I272 Pulmonary hypertension, unspecified: Secondary | ICD-10-CM | POA: Diagnosis present

## 2018-07-01 DIAGNOSIS — Z7984 Long term (current) use of oral hypoglycemic drugs: Secondary | ICD-10-CM | POA: Diagnosis not present

## 2018-07-01 DIAGNOSIS — R0601 Orthopnea: Secondary | ICD-10-CM | POA: Diagnosis present

## 2018-07-01 DIAGNOSIS — G4730 Sleep apnea, unspecified: Secondary | ICD-10-CM | POA: Diagnosis present

## 2018-07-01 DIAGNOSIS — Z79899 Other long term (current) drug therapy: Secondary | ICD-10-CM | POA: Diagnosis not present

## 2018-07-01 DIAGNOSIS — E876 Hypokalemia: Secondary | ICD-10-CM | POA: Diagnosis present

## 2018-07-01 DIAGNOSIS — Z96652 Presence of left artificial knee joint: Secondary | ICD-10-CM | POA: Diagnosis not present

## 2018-07-01 DIAGNOSIS — Z23 Encounter for immunization: Secondary | ICD-10-CM | POA: Diagnosis not present

## 2018-07-01 DIAGNOSIS — G4700 Insomnia, unspecified: Secondary | ICD-10-CM | POA: Diagnosis present

## 2018-07-01 DIAGNOSIS — I214 Non-ST elevation (NSTEMI) myocardial infarction: Secondary | ICD-10-CM | POA: Diagnosis not present

## 2018-07-01 DIAGNOSIS — I25110 Atherosclerotic heart disease of native coronary artery with unstable angina pectoris: Secondary | ICD-10-CM | POA: Diagnosis present

## 2018-07-01 DIAGNOSIS — I10 Essential (primary) hypertension: Secondary | ICD-10-CM | POA: Diagnosis present

## 2018-07-01 DIAGNOSIS — I252 Old myocardial infarction: Secondary | ICD-10-CM | POA: Diagnosis not present

## 2018-07-01 DIAGNOSIS — I2584 Coronary atherosclerosis due to calcified coronary lesion: Secondary | ICD-10-CM | POA: Diagnosis present

## 2018-07-01 DIAGNOSIS — I200 Unstable angina: Secondary | ICD-10-CM | POA: Diagnosis present

## 2018-07-01 DIAGNOSIS — E785 Hyperlipidemia, unspecified: Secondary | ICD-10-CM | POA: Diagnosis present

## 2018-07-01 DIAGNOSIS — Z8249 Family history of ischemic heart disease and other diseases of the circulatory system: Secondary | ICD-10-CM | POA: Diagnosis not present

## 2018-07-01 LAB — LIPID PANEL
CHOL/HDL RATIO: 4.4 ratio
CHOLESTEROL: 151 mg/dL (ref 0–200)
HDL: 34 mg/dL — AB (ref >40)
LDL Cholesterol: 81 mg/dL (ref 0–99)
Triglycerides: 180 mg/dL — ABNORMAL HIGH (ref <150)
VLDL: 36 mg/dL (ref 0–40)

## 2018-07-01 LAB — PROTIME-INR
INR: 0.98
Prothrombin Time: 12.9 seconds (ref 11.4–15.2)

## 2018-07-01 LAB — BASIC METABOLIC PANEL
ANION GAP: 9 (ref 5–15)
BUN: 11 mg/dL (ref 8–23)
CO2: 24 mmol/L (ref 22–32)
Calcium: 8.8 mg/dL — ABNORMAL LOW (ref 8.9–10.3)
Chloride: 106 mmol/L (ref 98–111)
Creatinine, Ser: 1.1 mg/dL (ref 0.61–1.24)
GFR calc Af Amer: >60 mL/min (ref >60)
Glucose, Bld: 140 mg/dL — ABNORMAL HIGH (ref 70–99)
POTASSIUM: 3.6 mmol/L (ref 3.5–5.1)
SODIUM: 139 mmol/L (ref 135–145)

## 2018-07-01 LAB — CBC
HEMATOCRIT: 39 % (ref 39.0–52.0)
Hemoglobin: 12.8 g/dL — ABNORMAL LOW (ref 13.0–17.0)
MCH: 29 pg (ref 26.0–34.0)
MCHC: 32.8 g/dL (ref 30.0–36.0)
MCV: 88.4 fL (ref 80.0–100.0)
Platelets: 144 10*3/uL — ABNORMAL LOW (ref 150–400)
RBC: 4.41 MIL/uL (ref 4.22–5.81)
RDW: 14.4 % (ref 11.5–15.5)
WBC: 4.3 10*3/uL (ref 4.0–10.5)
nRBC: 0 % (ref 0.0–0.2)

## 2018-07-01 LAB — HEPARIN LEVEL (UNFRACTIONATED)
HEPARIN UNFRACTIONATED: 0.16 [IU]/mL — AB (ref 0.30–0.70)
HEPARIN UNFRACTIONATED: 0.28 [IU]/mL — AB (ref 0.30–0.70)
Heparin Unfractionated: <0.1 IU/mL — ABNORMAL LOW (ref 0.30–0.70)

## 2018-07-01 LAB — GLUCOSE, CAPILLARY
GLUCOSE-CAPILLARY: 144 mg/dL — AB (ref 70–99)
GLUCOSE-CAPILLARY: 135 mg/dL — AB (ref 70–99)
Glucose-Capillary: 143 mg/dL — ABNORMAL HIGH (ref 70–99)
Glucose-Capillary: 123 mg/dL — ABNORMAL HIGH (ref 70–99)

## 2018-07-01 LAB — HIV ANTIBODY (ROUTINE TESTING W REFLEX): HIV SCREEN 4TH GENERATION: NONREACTIVE

## 2018-07-01 LAB — TROPONIN I: Troponin I: 0.19 ng/mL (ref <0.03)

## 2018-07-01 MED ORDER — HYDRALAZINE HCL 25 MG PO TABS
25.0000 mg | ORAL_TABLET | Freq: Four times a day (QID) | ORAL | Status: DC
  Administered 2018-07-01 – 2018-07-04 (×13): 25 mg via ORAL
  Filled 2018-07-01 (×13): qty 1

## 2018-07-01 NOTE — Progress Notes (Signed)
Pt has trop 0.19  RN made aware of pt's 9 beat run of V tact. Pt is resting in bed comfortably no chest pain or sign and symptoms of distress. RN paged MD. Waiting a return phone call

## 2018-07-01 NOTE — Progress Notes (Signed)
ANTICOAGULATION CONSULT NOTE - Follow Up Consult  Pharmacy Consult for heparin Indication: CAD awaiting PCI  Labs: Recent Labs    06/30/18 0828  06/30/18 1129 06/30/18 1546 06/30/18 2153 07/01/18 0334  HGB 13.9  --   --   --   --  12.8*  HCT 42.9  --   --   --   --  39.0  PLT 156  --   --   --   --  144*  APTT  --   --  27  --   --   --   LABPROT  --   --   --   --   --  12.9  INR  --   --   --   --   --  0.98  HEPARINUNFRC  --   --   --   --   --  <0.10*  CREATININE 1.03  --   --   --   --  1.10  TROPONINI <0.03   < >  --  <0.03 0.13* 0.19*   < > = values in this interval not displayed.    Assessment: 62yo male undetectable on heparin with initial dosing post-cath; no gtt issues or signs of bleeding overnight per RN.  Goal of Therapy:  Heparin level 0.3-0.7 units/ml   Plan:  Will increase heparin gtt by 3-4 units/kg/hr to 1300 units/hr and check level in 6 hours.    Wynona Neat, PharmD, BCPS  07/01/2018,6:59 AM

## 2018-07-01 NOTE — Plan of Care (Signed)

## 2018-07-01 NOTE — Progress Notes (Signed)
Cardiology History and Physical    Patient ID: COREE BRAME; [id number]; Oct 29, 1955   Admit date: 06/30/2018 Date of Admission: 07/01/2018  Primary Care Provider: [name], DO Primary Cardiologist: Sherren Mocha, MD    Patient Profile    [name] Foster is a 62 y.o. male with past medical history of CAD (s/p inferior STEMI in 03/2017 with DES to LCx, DES to PDA and PTCA alone to Ostial PDA in 09/2017, residual 65% Proximal LAD stenosis not significant by FFR), HTN, HLD, and Type 2 DM who is being seen today for the evaluation of chest pain at the request of Dr. Winfred Leeds.   Subjective:  No angina this am    Past Medical History:  Diagnosis Date  . CAD in native artery    a. 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 DES to left PDA 09/20/17  . Diabetes mellitus without complication ([place])   . High cholesterol   . Hyperlipidemia   . Hypertension   . Insomnia   . Pulmonary hypertension ([place])    a. TTE 03/25/17; EF 60-65%, nl WM, LV diastolic fxn nl, trivial AI, RV cavity size, wall thickness, and sys fxn nl, trivial TR, PASP 39 mmHg, trivial pericardial effusion  . Sleep apnea    could not use a cpap  . Wears dentures    full top-partial bottom  . Wears glasses     Past Surgical History:  Procedure Laterality Date  . COLONOSCOPY    . CORONARY STENT INTERVENTION N/A 09/20/2017   Procedure: CORONARY STENT INTERVENTION;  Surgeon: Leonie Man, MD;  Location: [facility];  Service: Cardiovascular;  Laterality: N/A;  . CORONARY/GRAFT ACUTE MI REVASCULARIZATION N/A 03/25/2017   Procedure: Coronary/Graft Acute MI Revascularization;  Surgeon: Sherren Mocha, MD;  Location: [facility];  Service: Cardiovascular;  Laterality: N/A;  . GANGLION CYST EXCISION Left 07/25/2014   Procedure: EXCISION VOLAR AND DORSAL GANGLION LEFT WRIST;   Surgeon: Leanora Cover, MD;  Location: [facility];  Service: Orthopedics;  Laterality: Left;  . INTRAVASCULAR PRESSURE WIRE/FFR STUDY N/A 09/20/2017   Procedure: INTRAVASCULAR PRESSURE WIRE/FFR STUDY;  Surgeon: Leonie Man, MD;  Location: [facility];  Service: Cardiovascular;  Laterality: N/A;  . KNEE ARTHROSCOPY     rifgr and left  . LEFT HEART CATH AND CORONARY ANGIOGRAPHY N/A 03/25/2017   Procedure: Left Heart Cath and Coronary Angiography;  Surgeon: Sherren Mocha, MD;  Location: [facility];  Service: Cardiovascular;  Laterality: N/A;  . LEFT HEART CATH AND CORONARY ANGIOGRAPHY N/A 09/20/2017   Procedure: LEFT HEART CATH AND CORONARY ANGIOGRAPHY;  Surgeon: Leonie Man, MD;  Location: [facility];  Service: Cardiovascular;  Laterality: N/A;  . SHOULDER ARTHROSCOPY  2012   left  . SHOULDER ARTHROSCOPY     right  . TEMPORARY PACEMAKER N/A 03/25/2017   Procedure: Temporary Pacemaker;  Surgeon: Sherren Mocha, MD;  Location: [facility];  Service: Cardiovascular;  Laterality: N/A;  . TONSILLECTOMY    . TOTAL KNEE ARTHROPLASTY  2002   left     Home Medications:  Prior to Admission medications   Medication Sig Start Date End Date Taking? Authorizing Provider  aspirin 81 MG EC tablet TAKE 1 TABLET BY MOUTH EVERY DAY 03/14/18   Rise Mu, PA-C  atorvastatin (LIPITOR) 80  MG tablet TAKE 1 TABLET (80 MG TOTAL) BY MOUTH DAILY AT 6 PM. 03/06/18   Sherren Mocha, MD  clopidogrel (PLAVIX) 75 MG tablet Take 1 tablet (75 mg total) by mouth daily. 02/13/18 02/08/19  Sherren Mocha, MD  HYDROcodone-acetaminophen (NORCO) 7.5-325 MG tablet Take 1 tablet by mouth every 6 (six) hours as needed for moderate pain.    [provider]  isosorbide mononitrate (IMDUR) 30 MG 24 hr tablet Take 0.5 tablets (15 mg total) by mouth daily. 09/16/17 09/11/18  Sherren Mocha, MD  losartan (COZAAR) 100 MG tablet Take 1 tablet (100 mg total) by mouth daily. 02/13/18  02/08/19  Sherren Mocha, MD  metFORMIN (GLUCOPHAGE) 1000 MG tablet Take 500 mg by mouth 2 (two) times daily with a meal. 08/15/17   [provider]  metoprolol tartrate (LOPRESSOR) 50 MG tablet Take 1 tablet (50 mg total) by mouth 2 (two) times daily. 12/07/17 12/02/18  Sherren Mocha, MD  nitroGLYCERIN (NITROSTAT) 0.4 MG SL tablet Place 1 tablet (0.4 mg total) under the tongue every 5 (five) minutes x 3 doses as needed for chest pain. 03/27/17   Dunn, Areta Haber, PA-C  zolpidem (AMBIEN) 10 MG tablet Take 10 mg by mouth at bedtime as needed for sleep.     [provider]    Inpatient Medications: Scheduled Meds: . amLODipine  5 mg Oral Daily  . aspirin EC  81 mg Oral Daily  . atorvastatin  80 mg Oral q1800  . clopidogrel  75 mg Oral Daily  . hydrALAZINE  25 mg Oral Q6H  . insulin aspart  0-15 Units Subcutaneous TID WC  . isosorbide mononitrate  15 mg Oral Daily  . losartan  100 mg Oral Daily  . metoprolol tartrate  50 mg Oral BID  . sodium chloride flush  3 mL Intravenous Q12H   Continuous Infusions: . sodium chloride 100 mL/hr at 07/01/18 0623  . sodium chloride    . heparin 1,300 Units/hr (07/01/18 0720)   PRN Meds: sodium chloride, acetaminophen, alum & mag hydroxide-simeth, HYDROcodone-acetaminophen, nitroGLYCERIN, ondansetron (ZOFRAN) IV, sodium chloride flush  Allergies:   No Known Allergies  Social History:   Social History   Socioeconomic History  . Marital status: Single    Spouse name: Not on file  . Number of children: Not on file  . Years of education: Not on file  . Highest education level: Not on file  Occupational History  . Occupation: Retired  Scientific laboratory technician  . Financial resource strain: Not on file  . Food insecurity:    Worry: Not on file    Inability: Not on file  . Transportation needs:    Medical: Not on file    Non-medical: Not on file  Tobacco Use  . Smoking status: Former Smoker    Packs/day: 1.00    Last attempt to quit:  03/25/2017    Years since quitting: 1.2  . Smokeless tobacco: Never Used  Substance and Sexual Activity  . Alcohol use: Yes    Alcohol/week: 3.0 standard drinks    Types: 1 Glasses of wine, 1 Cans of beer, 1 Shots of liquor per week    Comment: drinks daily  beer and whiskey  . Drug use: No  . Sexual activity: Not on file  Lifestyle  . Physical activity:    Days per week: Not on file    Minutes per session: Not on file  . Stress: Not on file  Relationships  . Social connections:    Talks  on phone: Not on file    Gets together: Not on file    Attends religious service: Not on file    Active member of club or organization: Not on file    Attends meetings of clubs or organizations: Not on file    Relationship status: Not on file  . Intimate partner violence:    Fear of current or ex partner: Not on file    Emotionally abused: Not on file    Physically abused: Not on file    Forced sexual activity: Not on file  Other Topics Concern  . Not on file  Social History Narrative   Per wife, pt was preparing to retire soon from his job at [employer].     Family History:    Family History  Problem Relation Age of Onset  . Hypertension Mother   . Hypertension Father   . Hypertension Brother       Review of Systems    General:  No chills, fever, night sweats or weight changes.  Cardiovascular:  No edema, orthopnea, palpitations, paroxysmal nocturnal dyspnea. Positive for chest pain and dyspnea.  Dermatological: No rash, lesions/masses Respiratory: No cough, dyspnea Urologic: No hematuria, dysuria Abdominal:   No nausea, vomiting, diarrhea, bright red blood per rectum, melena, or hematemesis Neurologic:  No visual changes, wkns, changes in mental status. All other systems reviewed and are otherwise negative except as noted above.  Physical Exam/Data    Vitals:   07/01/18 0732 07/01/18 0754 07/01/18 0838 07/01/18 1108  BP: (!) 187/98 (!) 161/92 (!) 155/82 135/69  Pulse: 74 68 62 68   Resp:  11  16  Temp:   98 F (36.7 C) 98.3 F (36.8 C)  TempSrc:   Oral Oral  SpO2:  98% (!) 67% 98%  Weight:      Height:        Intake/Output Summary (Last 24 hours) at 07/01/2018 1133 Last data filed at 07/01/2018 1100 Gross per 24 hour  Intake 882.57 ml  Output 2200 ml  Net -1317.43 ml   Filed Weights   06/30/18 0819 07/01/18 0500  Weight: 90.7 kg 85.2 kg   Affect appropriate Healthy:  appears stated age HEENT: normal Neck supple with no adenopathy JVP normal no bruits no thyromegaly Lungs clear with no wheezing and good diaphragmatic motion Heart:  S1/S2 no murmur, no rub, gallop or click PMI normal Abdomen: benighn, BS positve, no tenderness, no AAA no bruit.  No HSM or HJR Distal pulses intact with no bruits No edema Neuro non-focal Skin warm and dry No muscular weakness Right radial cath sight A    EKG:  The EKG was personally reviewed and demonstrates: Sinus bradycardia, heart rate 57, with no acute ST changes when compared to prior tracings.  Telemetry:  Telemetry was personally reviewed and demonstrates: Sinus bradycardia, HR in 50's to 60's.    Labs/Studies     Relevant CV Studies:  Cardiac Catheterization: 09/2017     LPDA lesion is 90% stenosed. Previously nonvisualized vessel.  A drug-eluting stent was successfully placed using a STENT SYNERGY DES 2.25X28. --> Postdilated 2.4 mm  Post intervention, there e is a 0% residual stenosis.  Ost LPDA lesion is 60% stenosed. Vessel not previously visualized during an index PCI case. -- Treated with PTCA alone  Balloon angioplasty was performed using a BALLOON SAPPHIRE 2.0X15. Post intervention, there is a 40% residual stenosis.  Previously placed Mid Cx stent (unknown type) is widely patent.  Prox LAD lesion is  65% stenosed. FFR NOT Significant (0.83)  Ost 2nd Mrg to 2nd Mrg lesion is 60% stenosed.  Non-dominant vessel Mid RCA lesion is 50% stenosed with 50% stenosed side branch in Acute  Mrg.  The left ventricular systolic function is normal. The left ventricular ejection fraction is 55-65% by visual estimate.   Successful PCI of left posterior descending artery which was not previously visualized using a Synergy DES stent. Mild PTCA of the ostium of the PDA to allow for stent advancement.  Moderate residual disease noted.  Moderate severe disease in the proximal-midportion of the LAD but FFR not quite significant (0.83).  Otherwise preserved LVEF.  No other obvious culprit lesions noted.  Plan: Relatively stable post PCI.  He should be stable for same day discharge.  He is already on aspirin and Brilinta  We will continue home medications  Hold metformin for 48 hours post PCI  Follow-up with Dr. Burt Knack as directed.    Laboratory Data:  Chemistry Recent Labs  Lab 06/30/18 0828 07/01/18 0334  NA 141 139  K 4.2 3.6  CL 107 106  CO2 27 24  GLUCOSE 186* 140*  BUN 13 11  CREATININE 1.03 1.10  CALCIUM 9.3 8.8*  GFRNONAA >60 >60  GFRAA >60 >60  ANIONGAP 7 9    No results for input(s): PROT, ALBUMIN, AST, ALT, ALKPHOS, BILITOT in the last 168 hours. Hematology Recent Labs  Lab 06/30/18 0828 07/01/18 0334  WBC 4.6 4.3  RBC 4.83 4.41  HGB 13.9 12.8*  HCT 42.9 39.0  MCV 88.8 88.4  MCH 28.8 29.0  MCHC 32.4 32.8  RDW 14.3 14.4  PLT 156 144*   Cardiac Enzymes Recent Labs  Lab 06/30/18 0828 06/30/18 1115 06/30/18 1546 06/30/18 2153 07/01/18 0334  TROPONINI <0.03 <0.03 <0.03 0.13* 0.19*   No results for input(s): TROPIPOC in the last 168 hours.  BNPNo results for input(s): BNP, PROBNP in the last 168 hours.  DDimer No results for input(s): DDIMER in the last 168 hours.  Radiology/Studies:  Dg Chest 2 View  Result Date: 06/30/2018 CLINICAL DATA:  Left-sided chest pain and mild shortness-of-breath since yesterday. EXAM: CHEST - 2 VIEW COMPARISON:  03/25/2017 FINDINGS: Lungs are adequately inflated without focal airspace consolidation or  effusion. Minimal prominence of the central bronchovascular markings which may be due to mild degree of vascular congestion. Cardiomediastinal silhouette and remainder the exam is unchanged. IMPRESSION: Possible mild degree of vascular congestion. Electronically Signed   By: Marin Olp M.D.   On: 06/30/2018 09:21     Assessment & Plan    1. Chest Pain Concerning for Unstable Angin - s/p inferior STEMI in 03/2017 with DES to LCx, DES to PDA and PTCA alone to Ostial PDA in 09/2017. Residual 65% Proximal LAD stenosis not significant by FFR. Cath 06/30/18 with LAD stenosis for orbital atherectomy Monday Continue iv nitro and heparin previous stents patent    3. HTN -  Well controlled.  Continue current medications and low sodium Dash type diet.     4. HLD - recheck FLP. Continue Atorvastatin 80mg  daily.   5. Type 2 DM - hold Metformin in anticipation of cath. Start SSI.   For questions or updates, please contact [facility] Please consult [email or web address] for contact info under Cardiology/STEMI.  Rozann Lesches, PA-C 07/01/2018, 11:33 AM Pager: [phone]

## 2018-07-01 NOTE — Progress Notes (Signed)
ANTICOAGULATION CONSULT NOTE - Follow Up Consult  Pharmacy Consult for heparin Indication: chest pain/ACS  No Known Allergies  Patient Measurements: Height: 5\' 8"  (172.7 cm) Weight: 187 lb 13.3 oz (85.2 kg)(scale A) IBW/kg (Calculated) : 68.4 Heparin Dosing Weight: 87kg  Vital Signs: Temp: 98 F (36.7 C) (10/19 1526) Temp Source: Oral (10/19 1526) BP: 135/78 (10/19 1526) Pulse Rate: 75 (10/19 1526)  Labs: Recent Labs    06/30/18 0828  06/30/18 1129 06/30/18 1546 06/30/18 2153 07/01/18 0334 07/01/18 1234  HGB 13.9  --   --   --   --  12.8*  --   HCT 42.9  --   --   --   --  39.0  --   PLT 156  --   --   --   --  144*  --   APTT  --   --  27  --   --   --   --   LABPROT  --   --   --   --   --  12.9  --   INR  --   --   --   --   --  0.98  --   HEPARINUNFRC  --   --   --   --   --  <0.10* 0.16*  CREATININE 1.03  --   --   --   --  1.10  --   TROPONINI <0.03   < >  --  <0.03 0.13* 0.19*  --    < > = values in this interval not displayed.    Estimated Creatinine Clearance: 73 mL/min (by C-G formula based on SCr of 1.1 mg/dL).   Medical History: Past Medical History:  Diagnosis Date  . CAD in native artery    a. 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 DES to left PDA 09/20/17  . Diabetes mellitus without complication ([place])   . High cholesterol   . Hyperlipidemia   . Hypertension   . Insomnia   . Pulmonary hypertension ([place])    a. TTE 03/25/17; EF 60-65%, nl WM, LV diastolic fxn nl, trivial AI, RV cavity size, wall thickness, and sys fxn nl, trivial TR, PASP 39 mmHg, trivial pericardial effusion  . Sleep apnea    could not use a cpap  . Wears dentures    full top-partial bottom  . Wears glasses      Assessment: 26 yoM s/p LHC with heavily calcified multivessel disease. Pharmacy consulted to resume IV heparin 2-hr after TR band removed  (~1830 per RN) with plans for staged PCI and atherectomy on 10/21.  Heparin drip 1300 uts/hr HL 0.16 < goal.  CBC ok no bleeding    Goal of Therapy:  Heparin level 0.3-0.7 units/ml Monitor platelets by anticoagulation protocol: Yes   Plan:  Increase Heparin 1500 uts/hr  Check HL in 6hr Daily HL, CBC  Bonnita Nasuti Pharm.D. CPP, BCPS Clinical Pharmacist [phone] 07/01/2018 3:57 PM

## 2018-07-01 NOTE — Progress Notes (Signed)
ANTICOAGULATION CONSULT NOTE - Follow Up Consult  Pharmacy Consult for heparin Indication: chest pain/ACS  No Known Allergies  Patient Measurements: Height: 5\' 8"  (172.7 cm) Weight: 187 lb 13.3 oz (85.2 kg)(scale A) IBW/kg (Calculated) : 68.4 Heparin Dosing Weight: 87kg  Vital Signs: Temp: 98.2 F (36.8 C) (10/19 2018) Temp Source: Oral (10/19 2018) BP: 155/80 (10/19 2018) Pulse Rate: 83 (10/19 2018)  Labs: Recent Labs    06/30/18 0828  06/30/18 1129 06/30/18 1546 06/30/18 2153 07/01/18 0334 07/01/18 1234 07/01/18 2151  HGB 13.9  --   --   --   --  12.8*  --   --   HCT 42.9  --   --   --   --  39.0  --   --   PLT 156  --   --   --   --  144*  --   --   APTT  --   --  27  --   --   --   --   --   LABPROT  --   --   --   --   --  12.9  --   --   INR  --   --   --   --   --  0.98  --   --   HEPARINUNFRC  --   --   --   --   --  <0.10* 0.16* 0.28*  CREATININE 1.03  --   --   --   --  1.10  --   --   TROPONINI <0.03   < >  --  <0.03 0.13* 0.19*  --   --    < > = values in this interval not displayed.    Estimated Creatinine Clearance: 73 mL/min (by C-G formula based on SCr of 1.1 mg/dL).   Medical History: Past Medical History:  Diagnosis Date  . CAD in native artery    a. 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 DES to left PDA 09/20/17  . Diabetes mellitus without complication ([place])   . High cholesterol   . Hyperlipidemia   . Hypertension   . Insomnia   . Pulmonary hypertension ([place])    a. TTE 03/25/17; EF 60-65%, nl WM, LV diastolic fxn nl, trivial AI, RV cavity size, wall thickness, and sys fxn nl, trivial TR, PASP 39 mmHg, trivial pericardial effusion  . Sleep apnea    could not use a cpap  . Wears dentures    full top-partial bottom  . Wears glasses      Assessment: 48 yoM s/p LHC with heavily calcified multivessel disease.  Pharmacy consulted to resume IV heparin 2-hr after TR band removed (~1830 per RN) with plans for staged PCI and atherectomy on 10/21.  Heparin level now 0.28 units/ml   Goal of Therapy:  Heparin level 0.3-0.7 units/ml Monitor platelets by anticoagulation protocol: Yes   Plan:  Increase Heparin 1600 uts/hr  Daily HL, CBC  Thanks for allowing pharmacy to be a part of this patient's care.  Excell Seltzer, PharmD Clinical Pharmacist 07/01/2018 10:53 PM

## 2018-07-02 ENCOUNTER — Inpatient Hospital Stay (HOSPITAL_COMMUNITY): Payer: 59

## 2018-07-02 LAB — GLUCOSE, CAPILLARY
GLUCOSE-CAPILLARY: 141 mg/dL — AB (ref 70–99)
GLUCOSE-CAPILLARY: 142 mg/dL — AB (ref 70–99)
Glucose-Capillary: 139 mg/dL — ABNORMAL HIGH (ref 70–99)

## 2018-07-02 LAB — HEPARIN LEVEL (UNFRACTIONATED): HEPARIN UNFRACTIONATED: 0.52 [IU]/mL (ref 0.30–0.70)

## 2018-07-02 LAB — CBC
HEMATOCRIT: 36.8 % — AB (ref 39.0–52.0)
Hemoglobin: 11.9 g/dL — ABNORMAL LOW (ref 13.0–17.0)
MCH: 29.2 pg (ref 26.0–34.0)
MCHC: 32.3 g/dL (ref 30.0–36.0)
MCV: 90.2 fL (ref 80.0–100.0)
NRBC: 0 % (ref 0.0–0.2)
Platelets: 113 10*3/uL — ABNORMAL LOW (ref 150–400)
RBC: 4.08 MIL/uL — ABNORMAL LOW (ref 4.22–5.81)
RDW: 14.5 % (ref 11.5–15.5)
WBC: 4.3 10*3/uL (ref 4.0–10.5)

## 2018-07-02 LAB — PROTIME-INR
INR: 1.06
Prothrombin Time: 13.7 seconds (ref 11.4–15.2)

## 2018-07-02 MED ORDER — FUROSEMIDE 10 MG/ML IJ SOLN
20.0000 mg | Freq: Once | INTRAMUSCULAR | Status: AC
  Administered 2018-07-02: 20 mg via INTRAVENOUS
  Filled 2018-07-02: qty 2

## 2018-07-02 NOTE — Progress Notes (Signed)
Cardiology History and Physical    Patient ID: [name] Foster; [id number]; 11-29-1955   Admit date: 06/30/2018 Date of Admission: 07/02/2018  Primary Care Provider: [name], DO Primary Cardiologist: Sherren Mocha, MD    Patient Profile    [name] Foster is a 62 y.o. male with past medical history of CAD (s/p inferior STEMI in 03/2017 with DES to LCx, DES to PDA and PTCA alone to Ostial PDA in 09/2017, residual 65% Proximal LAD stenosis not significant by FFR), HTN, HLD, and Type 2 DM who is being seen today for the evaluation of chest pain at the request of Dr. Winfred Leeds.   Subjective:  No angina this am having some dyspnea and orthopnea Urinating a lot    Past Medical History:  Diagnosis Date  . CAD in native artery    a. 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 DES to left PDA 09/20/17  . Diabetes mellitus without complication ([place])   . High cholesterol   . Hyperlipidemia   . Hypertension   . Insomnia   . Pulmonary hypertension ([place])    a. TTE 03/25/17; EF 60-65%, nl WM, LV diastolic fxn nl, trivial AI, RV cavity size, wall thickness, and sys fxn nl, trivial TR, PASP 39 mmHg, trivial pericardial effusion  . Sleep apnea    could not use a cpap  . Wears dentures    full top-partial bottom  . Wears glasses     Past Surgical History:  Procedure Laterality Date  . COLONOSCOPY    . CORONARY STENT INTERVENTION N/A 09/20/2017   Procedure: CORONARY STENT INTERVENTION;  Surgeon: Leonie Man, MD;  Location: [facility];  Service: Cardiovascular;  Laterality: N/A;  . CORONARY/GRAFT ACUTE MI REVASCULARIZATION N/A 03/25/2017   Procedure: Coronary/Graft Acute MI Revascularization;  Surgeon: Sherren Mocha, MD;  Location: [facility];  Service: Cardiovascular;  Laterality: N/A;  . GANGLION CYST EXCISION Left 07/25/2014   Procedure: EXCISION VOLAR AND DORSAL GANGLION LEFT WRIST;  Surgeon: Leanora Cover, MD;  Location: [facility];  Service: Orthopedics;  Laterality: Left;  . INTRAVASCULAR PRESSURE WIRE/FFR STUDY N/A 09/20/2017   Procedure: INTRAVASCULAR PRESSURE WIRE/FFR STUDY;  Surgeon: Leonie Man, MD;  Location: [facility];  Service: Cardiovascular;  Laterality: N/A;  . KNEE ARTHROSCOPY     rifgr and left  . LEFT HEART CATH AND CORONARY ANGIOGRAPHY N/A 03/25/2017   Procedure: Left Heart Cath and Coronary Angiography;  Surgeon: Sherren Mocha, MD;  Location: [facility];  Service: Cardiovascular;  Laterality: N/A;  . LEFT HEART CATH AND CORONARY ANGIOGRAPHY N/A 09/20/2017   Procedure: LEFT HEART CATH AND CORONARY ANGIOGRAPHY;  Surgeon: Leonie Man, MD;  Location: [facility];  Service: Cardiovascular;  Laterality: N/A;  . SHOULDER ARTHROSCOPY  2012   left  . SHOULDER ARTHROSCOPY     right  . TEMPORARY PACEMAKER N/A 03/25/2017   Procedure: Temporary Pacemaker;  Surgeon: Sherren Mocha, MD;  Location: [facility];  Service: Cardiovascular;  Laterality: N/A;  . TONSILLECTOMY    . TOTAL KNEE ARTHROPLASTY  2002   left     Home Medications:  Prior to Admission medications   Medication Sig Start Date End Date Taking? Authorizing Provider  aspirin 81 MG EC tablet TAKE 1 TABLET BY MOUTH EVERY DAY 03/14/18   Dunn,  Ryan M, PA-C  atorvastatin (LIPITOR) 80 MG tablet TAKE 1 TABLET (80 MG TOTAL) BY MOUTH DAILY AT 6 PM. 03/06/18   Sherren Mocha, MD  clopidogrel (PLAVIX) 75 MG tablet Take 1 tablet (75 mg total) by mouth daily. 02/13/18 02/08/19  Sherren Mocha, MD  HYDROcodone-acetaminophen (NORCO) 7.5-325 MG tablet Take 1 tablet by mouth every 6 (six) hours as needed for moderate pain.    [provider]  isosorbide mononitrate (IMDUR) 30 MG 24 hr tablet Take 0.5 tablets (15 mg total) by mouth daily. 09/16/17 09/11/18  Sherren Mocha, MD  losartan (COZAAR) 100 MG  tablet Take 1 tablet (100 mg total) by mouth daily. 02/13/18 02/08/19  Sherren Mocha, MD  metFORMIN (GLUCOPHAGE) 1000 MG tablet Take 500 mg by mouth 2 (two) times daily with a meal. 08/15/17   [provider]  metoprolol tartrate (LOPRESSOR) 50 MG tablet Take 1 tablet (50 mg total) by mouth 2 (two) times daily. 12/07/17 12/02/18  Sherren Mocha, MD  nitroGLYCERIN (NITROSTAT) 0.4 MG SL tablet Place 1 tablet (0.4 mg total) under the tongue every 5 (five) minutes x 3 doses as needed for chest pain. 03/27/17   Dunn, Areta Haber, PA-C  zolpidem (AMBIEN) 10 MG tablet Take 10 mg by mouth at bedtime as needed for sleep.     [provider]    Inpatient Medications: Scheduled Meds: . amLODipine  5 mg Oral Daily  . aspirin EC  81 mg Oral Daily  . atorvastatin  80 mg Oral q1800  . clopidogrel  75 mg Oral Daily  . hydrALAZINE  25 mg Oral Q6H  . insulin aspart  0-15 Units Subcutaneous TID WC  . isosorbide mononitrate  15 mg Oral Daily  . losartan  100 mg Oral Daily  . metoprolol tartrate  50 mg Oral BID  . sodium chloride flush  3 mL Intravenous Q12H   Continuous Infusions: . sodium chloride 100 mL/hr at 07/02/18 0500  . sodium chloride    . heparin 1,600 Units/hr (07/02/18 0640)   PRN Meds: sodium chloride, acetaminophen, alum & mag hydroxide-simeth, HYDROcodone-acetaminophen, nitroGLYCERIN, ondansetron (ZOFRAN) IV, sodium chloride flush  Allergies:   No Known Allergies  Social History:   Social History   Socioeconomic History  . Marital status: Single    Spouse name: Not on file  . Number of children: Not on file  . Years of education: Not on file  . Highest education level: Not on file  Occupational History  . Occupation: Retired  Scientific laboratory technician  . Financial resource strain: Not on file  . Food insecurity:    Worry: Not on file    Inability: Not on file  . Transportation needs:    Medical: Not on file    Non-medical: Not on file  Tobacco Use  . Smoking status: Former  Smoker    Packs/day: 1.00    Last attempt to quit: 03/25/2017    Years since quitting: 1.2  . Smokeless tobacco: Never Used  Substance and Sexual Activity  . Alcohol use: Yes    Alcohol/week: 3.0 standard drinks    Types: 1 Glasses of wine, 1 Cans of beer, 1 Shots of liquor per week    Comment: drinks daily  beer and whiskey  . Drug use: No  . Sexual activity: Not on file  Lifestyle  . Physical activity:    Days per week: Not on file    Minutes per session: Not on file  . Stress: Not on file  Relationships  .  Social connections:    Talks on phone: Not on file    Gets together: Not on file    Attends religious service: Not on file    Active member of club or organization: Not on file    Attends meetings of clubs or organizations: Not on file    Relationship status: Not on file  . Intimate partner violence:    Fear of current or ex partner: Not on file    Emotionally abused: Not on file    Physically abused: Not on file    Forced sexual activity: Not on file  Other Topics Concern  . Not on file  Social History Narrative   Per wife, pt was preparing to retire soon from his job at [employer].     Family History:    Family History  Problem Relation Age of Onset  . Hypertension Mother   . Hypertension Father   . Hypertension Brother       Review of Systems    General:  No chills, fever, night sweats or weight changes.  Cardiovascular:  No edema, orthopnea, palpitations, paroxysmal nocturnal dyspnea. Positive for chest pain and dyspnea.  Dermatological: No rash, lesions/masses Respiratory: No cough, dyspnea Urologic: No hematuria, dysuria Abdominal:   No nausea, vomiting, diarrhea, bright red blood per rectum, melena, or hematemesis Neurologic:  No visual changes, wkns, changes in mental status. All other systems reviewed and are otherwise negative except as noted above.  Physical Exam/Data    Vitals:   07/02/18 0200 07/02/18 0300 07/02/18 0500 07/02/18 0828  BP: (!)  154/103 (!) 168/94  (!) 159/83  Pulse:    79  Resp: 14 20  18   Temp:  98 F (36.7 C)  97.9 F (36.6 C)  TempSrc:  Oral  Oral  SpO2:  98%  100%  Weight:   86.4 kg   Height:        Intake/Output Summary (Last 24 hours) at 07/02/2018 0939 Last data filed at 07/02/2018 0900 Gross per 24 hour  Intake 3132.15 ml  Output 0 ml  Net 3132.15 ml   Filed Weights   06/30/18 0819 07/01/18 0500 07/02/18 0500  Weight: 90.7 kg 85.2 kg 86.4 kg   Affect appropriate Healthy:  appears stated age HEENT: normal Neck supple with no adenopathy JVP normal no bruits no thyromegaly Lungs clear with no wheezing and good diaphragmatic motion Heart:  S1/S2 no murmur, no rub, gallop or click PMI normal Abdomen: benighn, BS positve, no tenderness, no AAA no bruit.  No HSM or HJR Distal pulses intact with no bruits No edema Neuro non-focal Skin warm and dry No muscular weakness Right radial cath sight A    EKG:  The EKG was personally reviewed and demonstrates: Sinus bradycardia, heart rate 57, with no acute ST changes when compared to prior tracings.  Telemetry:  Telemetry was personally reviewed and demonstrates: Sinus bradycardia, HR in 50's to 60's.    Labs/Studies     Relevant CV Studies:  Cardiac Catheterization: 09/2017     LPDA lesion is 90% stenosed. Previously nonvisualized vessel.  A drug-eluting stent was successfully placed using a STENT SYNERGY DES 2.25X28. --> Postdilated 2.4 mm  Post intervention, there e is a 0% residual stenosis.  Ost LPDA lesion is 60% stenosed. Vessel not previously visualized during an index PCI case. -- Treated with PTCA alone  Balloon angioplasty was performed using a BALLOON SAPPHIRE 2.0X15. Post intervention, there is a 40% residual stenosis.  Previously placed Mid Cx stent (  unknown type) is widely patent.  Prox LAD lesion is 65% stenosed. FFR NOT Significant (0.83)  Ost 2nd Mrg to 2nd Mrg lesion is 60% stenosed.  Non-dominant vessel Mid  RCA lesion is 50% stenosed with 50% stenosed side branch in Acute Mrg.  The left ventricular systolic function is normal. The left ventricular ejection fraction is 55-65% by visual estimate.   Successful PCI of left posterior descending artery which was not previously visualized using a Synergy DES stent. Mild PTCA of the ostium of the PDA to allow for stent advancement.  Moderate residual disease noted.  Moderate severe disease in the proximal-midportion of the LAD but FFR not quite significant (0.83).  Otherwise preserved LVEF.  No other obvious culprit lesions noted.  Plan: Relatively stable post PCI.  He should be stable for same day discharge.  He is already on aspirin and Brilinta  We will continue home medications  Hold metformin for 48 hours post PCI  Follow-up with Dr. Burt Knack as directed.    Laboratory Data:  Chemistry Recent Labs  Lab 06/30/18 0828 07/01/18 0334  NA 141 139  K 4.2 3.6  CL 107 106  CO2 27 24  GLUCOSE 186* 140*  BUN 13 11  CREATININE 1.03 1.10  CALCIUM 9.3 8.8*  GFRNONAA >60 >60  GFRAA >60 >60  ANIONGAP 7 9    No results for input(s): PROT, ALBUMIN, AST, ALT, ALKPHOS, BILITOT in the last 168 hours. Hematology Recent Labs  Lab 06/30/18 0828 07/01/18 0334 07/02/18 0259  WBC 4.6 4.3 4.3  RBC 4.83 4.41 4.08*  HGB 13.9 12.8* 11.9*  HCT 42.9 39.0 36.8*  MCV 88.8 88.4 90.2  MCH 28.8 29.0 29.2  MCHC 32.4 32.8 32.3  RDW 14.3 14.4 14.5  PLT 156 144* 113*   Cardiac Enzymes Recent Labs  Lab 06/30/18 0828 06/30/18 1115 06/30/18 1546 06/30/18 2153 07/01/18 0334  TROPONINI <0.03 <0.03 <0.03 0.13* 0.19*   No results for input(s): TROPIPOC in the last 168 hours.  BNPNo results for input(s): BNP, PROBNP in the last 168 hours.  DDimer No results for input(s): DDIMER in the last 168 hours.  Radiology/Studies:  Dg Chest 2 View  Result Date: 06/30/2018 CLINICAL DATA:  Left-sided chest pain and mild shortness-of-breath since yesterday.  EXAM: CHEST - 2 VIEW COMPARISON:  03/25/2017 FINDINGS: Lungs are adequately inflated without focal airspace consolidation or effusion. Minimal prominence of the central bronchovascular markings which may be due to mild degree of vascular congestion. Cardiomediastinal silhouette and remainder the exam is unchanged. IMPRESSION: Possible mild degree of vascular congestion. Electronically Signed   By: Marin Olp M.D.   On: 06/30/2018 09:21     Assessment & Plan    1. Chest Pain Concerning for Unstable Angin - s/p inferior STEMI in 03/2017 with DES to LCx, DES to PDA and PTCA alone to Ostial PDA in 09/2017. Residual 65% Proximal LAD stenosis not significant by FFR. Cath 06/30/18 with LAD stenosis for orbital atherectomy Monday Continue iv nitro and heparin previous stents patent    3. HTN -  Well controlled.  Continue current medications and low sodium Dash type diet.     4. HLD - recheck FLP. Continue Atorvastatin 80mg  daily.   5. Type 2 DM - hold Metformin in anticipation of cath. Start SSI.   6. Dyspnea/Orthopnea:  Lung exam ok weight down but I/O's indicate 3L positive will check CXR/BNP and give a dose of lasix today.   For questions or updates, please contact [place] Please  consult [email or web address] for contact info under Cardiology/STEMI.  Rozann Lesches, PA-C 07/02/2018, 9:39 AM Pager: [phone]

## 2018-07-02 NOTE — Progress Notes (Signed)
ANTICOAGULATION CONSULT NOTE - Follow Up Consult  Pharmacy Consult for heparin Indication: chest pain/ACS  No Known Allergies  Patient Measurements: Height: 5\' 8"  (172.7 cm) Weight: 190 lb 7.6 oz (86.4 kg) IBW/kg (Calculated) : 68.4 Heparin Dosing Weight: 87kg  Vital Signs: Temp: 97.8 F (36.6 C) (10/20 1200) Temp Source: Oral (10/20 1200) BP: 136/82 (10/20 1215) Pulse Rate: 55 (10/20 1200)  Labs: Recent Labs    06/30/18 0828  06/30/18 1129 06/30/18 1546 06/30/18 2153  07/01/18 0334 07/01/18 1234 07/01/18 2151 07/02/18 0259  HGB 13.9  --   --   --   --   --  12.8*  --   --  11.9*  HCT 42.9  --   --   --   --   --  39.0  --   --  36.8*  PLT 156  --   --   --   --   --  144*  --   --  113*  APTT  --   --  27  --   --   --   --   --   --   --   LABPROT  --   --   --   --   --   --  12.9  --   --  13.7  INR  --   --   --   --   --   --  0.98  --   --  1.06  HEPARINUNFRC  --   --   --   --   --    < > <0.10* 0.16* 0.28* 0.52  CREATININE 1.03  --   --   --   --   --  1.10  --   --   --   TROPONINI <0.03   < >  --  <0.03 0.13*  --  0.19*  --   --   --    < > = values in this interval not displayed.    Estimated Creatinine Clearance: 73.5 mL/min (by C-G formula based on SCr of 1.1 mg/dL).   Medical History: Past Medical History:  Diagnosis Date  . CAD in native artery    a. 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 DES to left PDA 09/20/17  . Diabetes mellitus without complication ([place])   . High cholesterol   . Hyperlipidemia   . Hypertension   . Insomnia   . Pulmonary hypertension ([place])    a. TTE 03/25/17; EF 60-65%, nl WM, LV diastolic fxn nl, trivial AI, RV cavity size, wall thickness, and sys fxn nl, trivial TR, PASP 39 mmHg, trivial pericardial effusion  . Sleep apnea    could not use a cpap  . Wears dentures    full top-partial bottom  .  Wears glasses    . sodium chloride 100 mL/hr at 07/02/18 0500  . sodium chloride    . heparin 1,600 Units/hr (07/02/18 0640)     Assessment: 78 yoM s/p LHC with heavily calcified multivessel disease. Pharmacy consulted to resume IV heparin after initial cath with plans for staged PCI and atherectomy on 10/21.  Heparin drip at 1600 units/hr.  Heparin level at goal.  CBC stable, no bleeding or complications noted.  Goal of Therapy:  Heparin level 0.3-0.7 units/ml Monitor platelets by anticoagulation protocol: Yes   Plan:  Continue IV heparin at current rate. Daily Heparin level, CBC F/u after cath.  Marguerite Olea, [facility] Clinical Pharmacist Phone [phone]  07/02/2018 12:21 PM

## 2018-07-02 NOTE — H&P (View-Only) (Signed)
Cardiology History and Physical    Patient ID: [name] Foster; [id number]; 04/04/1956   Admit date: 06/30/2018 Date of Admission: 07/02/2018  Primary Care Provider: [name], DO Primary Cardiologist: Sherren Mocha, MD    Patient Profile    [name] Foster is a 62 y.o. male with past medical history of CAD (s/p inferior STEMI in 03/2017 with DES to LCx, DES to PDA and PTCA alone to Ostial PDA in 09/2017, residual 65% Proximal LAD stenosis not significant by FFR), HTN, HLD, and Type 2 DM who is being seen today for the evaluation of chest pain at the request of Dr. Winfred Leeds.   Subjective:  No angina this am having some dyspnea and orthopnea Urinating a lot    Past Medical History:  Diagnosis Date  . CAD in native artery    a. 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 DES to left PDA 09/20/17  . Diabetes mellitus without complication ([place])   . High cholesterol   . Hyperlipidemia   . Hypertension   . Insomnia   . Pulmonary hypertension ([place])    a. TTE 03/25/17; EF 60-65%, nl WM, LV diastolic fxn nl, trivial AI, RV cavity size, wall thickness, and sys fxn nl, trivial TR, PASP 39 mmHg, trivial pericardial effusion  . Sleep apnea    could not use a cpap  . Wears dentures    full top-partial bottom  . Wears glasses     Past Surgical History:  Procedure Laterality Date  . COLONOSCOPY    . CORONARY STENT INTERVENTION N/A 09/20/2017   Procedure: CORONARY STENT INTERVENTION;  Surgeon: Leonie Man, MD;  Location: [place];  Service: Cardiovascular;  Laterality: N/A;  . CORONARY/GRAFT ACUTE MI REVASCULARIZATION N/A 03/25/2017   Procedure: Coronary/Graft Acute MI Revascularization;  Surgeon: Sherren Mocha, MD;  Location: [place] [facility];  Service: Cardiovascular;  Laterality: N/A;  . GANGLION CYST EXCISION Left 07/25/2014   Procedure: EXCISION VOLAR AND DORSAL GANGLION LEFT WRIST;  Surgeon: Leanora Cover, MD;  Location: [facility];  Service: Orthopedics;  Laterality: Left;  . INTRAVASCULAR PRESSURE WIRE/FFR STUDY N/A 09/20/2017   Procedure: INTRAVASCULAR PRESSURE WIRE/FFR STUDY;  Surgeon: Leonie Man, MD;  Location: [facility] [place] [facility];  Service: Cardiovascular;  Laterality: N/A;  . KNEE ARTHROSCOPY     rifgr and left  . LEFT HEART CATH AND CORONARY ANGIOGRAPHY N/A 03/25/2017   Procedure: Left Heart Cath and Coronary Angiography;  Surgeon: Sherren Mocha, MD;  Location: [facility];  Service: Cardiovascular;  Laterality: N/A;  . LEFT HEART CATH AND CORONARY ANGIOGRAPHY N/A 09/20/2017   Procedure: LEFT HEART CATH AND CORONARY ANGIOGRAPHY;  Surgeon: Leonie Man, MD;  Location: [place] [facility];  Service: Cardiovascular;  Laterality: N/A;  . SHOULDER ARTHROSCOPY  2012   left  . SHOULDER ARTHROSCOPY     right  . TEMPORARY PACEMAKER N/A 03/25/2017   Procedure: Temporary Pacemaker;  Surgeon: Sherren Mocha, MD;  Location: [facility];  Service: Cardiovascular;  Laterality: N/A;  . TONSILLECTOMY    . TOTAL KNEE ARTHROPLASTY  2002   left     Home Medications:  Prior to Admission medications   Medication Sig Start Date End Date Taking? Authorizing Provider  aspirin 81 MG EC tablet TAKE 1 TABLET BY MOUTH EVERY DAY 03/14/18   Dunn,  Ryan M, PA-C  atorvastatin (LIPITOR) 80 MG tablet TAKE 1 TABLET (80 MG TOTAL) BY MOUTH DAILY AT 6 PM. 03/06/18   Sherren Mocha, MD  clopidogrel (PLAVIX) 75 MG tablet Take 1 tablet (75 mg total) by mouth daily. 02/13/18 02/08/19  Sherren Mocha, MD  HYDROcodone-acetaminophen (NORCO) 7.5-325 MG tablet Take 1 tablet by mouth every 6 (six) hours as needed for moderate pain.    [provider]  isosorbide mononitrate (IMDUR) 30 MG 24 hr tablet Take 0.5 tablets (15 mg total) by mouth daily. 09/16/17 09/11/18  Sherren Mocha, MD  losartan (COZAAR) 100 MG  tablet Take 1 tablet (100 mg total) by mouth daily. 02/13/18 02/08/19  Sherren Mocha, MD  metFORMIN (GLUCOPHAGE) 1000 MG tablet Take 500 mg by mouth 2 (two) times daily with a meal. 08/15/17   [provider]  metoprolol tartrate (LOPRESSOR) 50 MG tablet Take 1 tablet (50 mg total) by mouth 2 (two) times daily. 12/07/17 12/02/18  Sherren Mocha, MD  nitroGLYCERIN (NITROSTAT) 0.4 MG SL tablet Place 1 tablet (0.4 mg total) under the tongue every 5 (five) minutes x 3 doses as needed for chest pain. 03/27/17   Dunn, Areta Haber, PA-C  zolpidem (AMBIEN) 10 MG tablet Take 10 mg by mouth at bedtime as needed for sleep.     [provider]    Inpatient Medications: Scheduled Meds: . amLODipine  5 mg Oral Daily  . aspirin EC  81 mg Oral Daily  . atorvastatin  80 mg Oral q1800  . clopidogrel  75 mg Oral Daily  . hydrALAZINE  25 mg Oral Q6H  . insulin aspart  0-15 Units Subcutaneous TID WC  . isosorbide mononitrate  15 mg Oral Daily  . losartan  100 mg Oral Daily  . metoprolol tartrate  50 mg Oral BID  . sodium chloride flush  3 mL Intravenous Q12H   Continuous Infusions: . sodium chloride 100 mL/hr at 07/02/18 0500  . sodium chloride    . heparin 1,600 Units/hr (07/02/18 0640)   PRN Meds: sodium chloride, acetaminophen, alum & mag hydroxide-simeth, HYDROcodone-acetaminophen, nitroGLYCERIN, ondansetron (ZOFRAN) IV, sodium chloride flush  Allergies:   No Known Allergies  Social History:   Social History   Socioeconomic History  . Marital status: Single    Spouse name: Not on file  . Number of children: Not on file  . Years of education: Not on file  . Highest education level: Not on file  Occupational History  . Occupation: Retired  Scientific laboratory technician  . Financial resource strain: Not on file  . Food insecurity:    Worry: Not on file    Inability: Not on file  . Transportation needs:    Medical: Not on file    Non-medical: Not on file  Tobacco Use  . Smoking status: Former  Smoker    Packs/day: 1.00    Last attempt to quit: 03/25/2017    Years since quitting: 1.2  . Smokeless tobacco: Never Used  Substance and Sexual Activity  . Alcohol use: Yes    Alcohol/week: 3.0 standard drinks    Types: 1 Glasses of wine, 1 Cans of beer, 1 Shots of liquor per week    Comment: drinks daily  beer and whiskey  . Drug use: No  . Sexual activity: Not on file  Lifestyle  . Physical activity:    Days per week: Not on file    Minutes per session: Not on file  . Stress: Not on file  Relationships  .  Social connections:    Talks on phone: Not on file    Gets together: Not on file    Attends religious service: Not on file    Active member of club or organization: Not on file    Attends meetings of clubs or organizations: Not on file    Relationship status: Not on file  . Intimate partner violence:    Fear of current or ex partner: Not on file    Emotionally abused: Not on file    Physically abused: Not on file    Forced sexual activity: Not on file  Other Topics Concern  . Not on file  Social History Narrative   Per wife, pt was preparing to retire soon from his job at [employer].     Family History:    Family History  Problem Relation Age of Onset  . Hypertension Mother   . Hypertension Father   . Hypertension Brother       Review of Systems    General:  No chills, fever, night sweats or weight changes.  Cardiovascular:  No edema, orthopnea, palpitations, paroxysmal nocturnal dyspnea. Positive for chest pain and dyspnea.  Dermatological: No rash, lesions/masses Respiratory: No cough, dyspnea Urologic: No hematuria, dysuria Abdominal:   No nausea, vomiting, diarrhea, bright red blood per rectum, melena, or hematemesis Neurologic:  No visual changes, wkns, changes in mental status. All other systems reviewed and are otherwise negative except as noted above.  Physical Exam/Data    Vitals:   07/02/18 0200 07/02/18 0300 07/02/18 0500 07/02/18 0828  BP: (!)  154/103 (!) 168/94  (!) 159/83  Pulse:    79  Resp: 14 20  18   Temp:  98 F (36.7 C)  97.9 F (36.6 C)  TempSrc:  Oral  Oral  SpO2:  98%  100%  Weight:   86.4 kg   Height:        Intake/Output Summary (Last 24 hours) at 07/02/2018 0939 Last data filed at 07/02/2018 0900 Gross per 24 hour  Intake 3132.15 ml  Output 0 ml  Net 3132.15 ml   Filed Weights   06/30/18 0819 07/01/18 0500 07/02/18 0500  Weight: 90.7 kg 85.2 kg 86.4 kg   Affect appropriate Healthy:  appears stated age HEENT: normal Neck supple with no adenopathy JVP normal no bruits no thyromegaly Lungs clear with no wheezing and good diaphragmatic motion Heart:  S1/S2 no murmur, no rub, gallop or click PMI normal Abdomen: benighn, BS positve, no tenderness, no AAA no bruit.  No HSM or HJR Distal pulses intact with no bruits No edema Neuro non-focal Skin warm and dry No muscular weakness Right radial cath sight A    EKG:  The EKG was personally reviewed and demonstrates: Sinus bradycardia, heart rate 57, with no acute ST changes when compared to prior tracings.  Telemetry:  Telemetry was personally reviewed and demonstrates: Sinus bradycardia, HR in 50's to 60's.    Labs/Studies     Relevant CV Studies:  Cardiac Catheterization: 09/2017     LPDA lesion is 90% stenosed. Previously nonvisualized vessel.  A drug-eluting stent was successfully placed using a STENT SYNERGY DES 2.25X28. --> Postdilated 2.4 mm  Post intervention, there e is a 0% residual stenosis.  Ost LPDA lesion is 60% stenosed. Vessel not previously visualized during an index PCI case. -- Treated with PTCA alone  Balloon angioplasty was performed using a BALLOON SAPPHIRE 2.0X15. Post intervention, there is a 40% residual stenosis.  Previously placed Mid Cx stent (  unknown type) is widely patent.  Prox LAD lesion is 65% stenosed. FFR NOT Significant (0.83)  Ost 2nd Mrg to 2nd Mrg lesion is 60% stenosed.  Non-dominant vessel Mid  RCA lesion is 50% stenosed with 50% stenosed side branch in Acute Mrg.  The left ventricular systolic function is normal. The left ventricular ejection fraction is 55-65% by visual estimate.   Successful PCI of left posterior descending artery which was not previously visualized using a Synergy DES stent. Mild PTCA of the ostium of the PDA to allow for stent advancement.  Moderate residual disease noted.  Moderate severe disease in the proximal-midportion of the LAD but FFR not quite significant (0.83).  Otherwise preserved LVEF.  No other obvious culprit lesions noted.  Plan: Relatively stable post PCI.  He should be stable for same day discharge.  He is already on aspirin and Brilinta  We will continue home medications  Hold metformin for 48 hours post PCI  Follow-up with Dr. Burt Knack as directed.    Laboratory Data:  Chemistry Recent Labs  Lab 06/30/18 0828 07/01/18 0334  NA 141 139  K 4.2 3.6  CL 107 106  CO2 27 24  GLUCOSE 186* 140*  BUN 13 11  CREATININE 1.03 1.10  CALCIUM 9.3 8.8*  GFRNONAA >60 >60  GFRAA >60 >60  ANIONGAP 7 9    No results for input(s): PROT, ALBUMIN, AST, ALT, ALKPHOS, BILITOT in the last 168 hours. Hematology Recent Labs  Lab 06/30/18 0828 07/01/18 0334 07/02/18 0259  WBC 4.6 4.3 4.3  RBC 4.83 4.41 4.08*  HGB 13.9 12.8* 11.9*  HCT 42.9 39.0 36.8*  MCV 88.8 88.4 90.2  MCH 28.8 29.0 29.2  MCHC 32.4 32.8 32.3  RDW 14.3 14.4 14.5  PLT 156 144* 113*   Cardiac Enzymes Recent Labs  Lab 06/30/18 0828 06/30/18 1115 06/30/18 1546 06/30/18 2153 07/01/18 0334  TROPONINI <0.03 <0.03 <0.03 0.13* 0.19*   No results for input(s): TROPIPOC in the last 168 hours.  BNPNo results for input(s): BNP, PROBNP in the last 168 hours.  DDimer No results for input(s): DDIMER in the last 168 hours.  Radiology/Studies:  Dg Chest 2 View  Result Date: 06/30/2018 CLINICAL DATA:  Left-sided chest pain and mild shortness-of-breath since yesterday.  EXAM: CHEST - 2 VIEW COMPARISON:  03/25/2017 FINDINGS: Lungs are adequately inflated without focal airspace consolidation or effusion. Minimal prominence of the central bronchovascular markings which may be due to mild degree of vascular congestion. Cardiomediastinal silhouette and remainder the exam is unchanged. IMPRESSION: Possible mild degree of vascular congestion. Electronically Signed   By: Marin Olp M.D.   On: 06/30/2018 09:21     Assessment & Plan    1. Chest Pain Concerning for Unstable Angin - s/p inferior STEMI in 03/2017 with DES to LCx, DES to PDA and PTCA alone to Ostial PDA in 09/2017. Residual 65% Proximal LAD stenosis not significant by FFR. Cath 06/30/18 with LAD stenosis for orbital atherectomy Monday Continue iv nitro and heparin previous stents patent    3. HTN -  Well controlled.  Continue current medications and low sodium Dash type diet.     4. HLD - recheck FLP. Continue Atorvastatin 80mg  daily.   5. Type 2 DM - hold Metformin in anticipation of cath. Start SSI.   6. Dyspnea/Orthopnea:  Lung exam ok weight down but I/O's indicate 3L positive will check CXR/BNP and give a dose of lasix today.   For questions or updates, please contact [place] Please  consult [email or web address] for contact info under Cardiology/STEMI.  Rozann Lesches, PA-C 07/02/2018, 9:39 AM Pager: [phone]

## 2018-07-03 ENCOUNTER — Encounter (HOSPITAL_COMMUNITY): Payer: Self-pay | Admitting: Internal Medicine

## 2018-07-03 ENCOUNTER — Encounter (HOSPITAL_COMMUNITY)
Admission: EM | Disposition: A | Discharge status: Home / Self Care | Payer: Self-pay | Source: Home / Self Care | Attending: Cardiology

## 2018-07-03 DIAGNOSIS — I214 Non-ST elevation (NSTEMI) myocardial infarction: Secondary | ICD-10-CM

## 2018-07-03 HISTORY — PX: CORONARY STENT INTERVENTION: CATH118234

## 2018-07-03 HISTORY — PX: CORONARY ATHERECTOMY: CATH118238

## 2018-07-03 LAB — POCT ACTIVATED CLOTTING TIME
Activated Clotting Time: 356 seconds
Activated Clotting Time: 510 seconds

## 2018-07-03 LAB — BASIC METABOLIC PANEL
ANION GAP: 9 (ref 5–15)
BUN: 10 mg/dL (ref 8–23)
CHLORIDE: 109 mmol/L (ref 98–111)
CO2: 22 mmol/L (ref 22–32)
Calcium: 8.5 mg/dL — ABNORMAL LOW (ref 8.9–10.3)
Creatinine, Ser: 1.02 mg/dL (ref 0.61–1.24)
GFR calc Af Amer: >60 mL/min (ref >60)
GFR calc non Af Amer: >60 mL/min (ref >60)
Glucose, Bld: 128 mg/dL — ABNORMAL HIGH (ref 70–99)
POTASSIUM: 3.4 mmol/L — AB (ref 3.5–5.1)
Sodium: 140 mmol/L (ref 135–145)

## 2018-07-03 LAB — GLUCOSE, CAPILLARY
GLUCOSE-CAPILLARY: 131 mg/dL — AB (ref 70–99)
GLUCOSE-CAPILLARY: 170 mg/dL — AB (ref 70–99)
Glucose-Capillary: 102 mg/dL — ABNORMAL HIGH (ref 70–99)
Glucose-Capillary: 98 mg/dL (ref 70–99)

## 2018-07-03 LAB — CBC
HCT: 38.9 % — ABNORMAL LOW (ref 39.0–52.0)
Hemoglobin: 12.3 g/dL — ABNORMAL LOW (ref 13.0–17.0)
MCH: 28.5 pg (ref 26.0–34.0)
MCHC: 31.6 g/dL (ref 30.0–36.0)
MCV: 90.3 fL (ref 80.0–100.0)
NRBC: 0 % (ref 0.0–0.2)
PLATELETS: 126 10*3/uL — AB (ref 150–400)
RBC: 4.31 MIL/uL (ref 4.22–5.81)
RDW: 14.3 % (ref 11.5–15.5)
WBC: 3.9 10*3/uL — ABNORMAL LOW (ref 4.0–10.5)

## 2018-07-03 LAB — PROTIME-INR
INR: 1.06
Prothrombin Time: 13.7 seconds (ref 11.4–15.2)

## 2018-07-03 LAB — BRAIN NATRIURETIC PEPTIDE: B Natriuretic Peptide: 239.8 pg/mL — ABNORMAL HIGH (ref 0.0–100.0)

## 2018-07-03 LAB — HEPARIN LEVEL (UNFRACTIONATED): HEPARIN UNFRACTIONATED: 0.66 [IU]/mL (ref 0.30–0.70)

## 2018-07-03 SURGERY — CORONARY STENT INTERVENTION
Anesthesia: LOCAL

## 2018-07-03 MED ORDER — HEPARIN (PORCINE) IN NACL 1000-0.9 UT/500ML-% IV SOLN
INTRAVENOUS | Status: DC | PRN
  Administered 2018-07-03 (×2): 500 mL

## 2018-07-03 MED ORDER — SODIUM CHLORIDE 0.9 % IV SOLN: 250.0000 mL | INTRAVENOUS | Status: DC | PRN

## 2018-07-03 MED ORDER — NITROGLYCERIN 1 MG/10 ML FOR IR/CATH LAB
INTRA_ARTERIAL | Status: DC | PRN
  Administered 2018-07-03: 200 ug via INTRACORONARY

## 2018-07-03 MED ORDER — HYDRALAZINE HCL 20 MG/ML IJ SOLN: 5.0000 mg | INTRAMUSCULAR | Status: AC | PRN

## 2018-07-03 MED ORDER — SODIUM CHLORIDE 0.9 % IV SOLN
INTRAVENOUS | Status: AC
  Administered 2018-07-03: 11:00:00 via INTRAVENOUS

## 2018-07-03 MED ORDER — SODIUM CHLORIDE 0.9 % WEIGHT BASED INFUSION: 3.0000 mL/kg/h | INTRAVENOUS | Status: DC

## 2018-07-03 MED ORDER — VIPERSLIDE LUBRICANT OPTIME
TOPICAL | Status: DC | PRN
  Administered 2018-07-03: 10:00:00 via SURGICAL_CAVITY

## 2018-07-03 MED ORDER — SODIUM CHLORIDE 0.9% FLUSH: 3.0000 mL | Freq: Two times a day (BID) | INTRAVENOUS | Status: DC

## 2018-07-03 MED ORDER — HYDRALAZINE HCL 20 MG/ML IJ SOLN
10.0000 mg | INTRAMUSCULAR | Status: DC | PRN
  Filled 2018-07-03: qty 1

## 2018-07-03 MED ORDER — ANGIOPLASTY BOOK
Freq: Once | Status: AC
  Administered 2018-07-04: 01:00:00
  Filled 2018-07-03: qty 1

## 2018-07-03 MED ORDER — MIDAZOLAM HCL 2 MG/2ML IJ SOLN
INTRAMUSCULAR | Status: DC | PRN
  Administered 2018-07-03 (×2): 1 mg via INTRAVENOUS
  Administered 2018-07-03: 2 mg via INTRAVENOUS

## 2018-07-03 MED ORDER — MIDAZOLAM HCL 2 MG/2ML IJ SOLN
INTRAMUSCULAR | Status: AC
  Filled 2018-07-03: qty 2

## 2018-07-03 MED ORDER — FENTANYL CITRATE (PF) 100 MCG/2ML IJ SOLN
INTRAMUSCULAR | Status: AC
  Filled 2018-07-03: qty 2

## 2018-07-03 MED ORDER — VERAPAMIL HCL 2.5 MG/ML IV SOLN
INTRAVENOUS | Status: AC
  Filled 2018-07-03: qty 2

## 2018-07-03 MED ORDER — LIDOCAINE HCL (PF) 1 % IJ SOLN
INTRAMUSCULAR | Status: DC | PRN
  Administered 2018-07-03: 2 mL

## 2018-07-03 MED ORDER — THE SENSUOUS HEART BOOK
Freq: Once | Status: AC
  Administered 2018-07-04: 01:00:00
  Filled 2018-07-03: qty 1

## 2018-07-03 MED ORDER — VERAPAMIL HCL 2.5 MG/ML IV SOLN
INTRAVENOUS | Status: DC | PRN
  Administered 2018-07-03: 10 mL via INTRA_ARTERIAL

## 2018-07-03 MED ORDER — NITROGLYCERIN IN D5W 200-5 MCG/ML-% IV SOLN
INTRAVENOUS | Status: AC
  Filled 2018-07-03: qty 250

## 2018-07-03 MED ORDER — HEART ATTACK BOUNCING BOOK
Freq: Once | Status: AC
  Administered 2018-07-04: 01:00:00
  Filled 2018-07-03: qty 1

## 2018-07-03 MED ORDER — ASPIRIN 81 MG PO CHEW
81.0000 mg | CHEWABLE_TABLET | ORAL | Status: AC
  Administered 2018-07-03: 81 mg via ORAL
  Filled 2018-07-03: qty 1

## 2018-07-03 MED ORDER — LABETALOL HCL 5 MG/ML IV SOLN: 10.0000 mg | INTRAVENOUS | Status: AC | PRN

## 2018-07-03 MED ORDER — HEPARIN SODIUM (PORCINE) 1000 UNIT/ML IJ SOLN
INTRAMUSCULAR | Status: DC | PRN
  Administered 2018-07-03: 10000 [IU] via INTRAVENOUS

## 2018-07-03 MED ORDER — SODIUM CHLORIDE 0.9% FLUSH: 3.0000 mL | INTRAVENOUS | Status: DC | PRN

## 2018-07-03 MED ORDER — HEPARIN SODIUM (PORCINE) 1000 UNIT/ML IJ SOLN
INTRAMUSCULAR | Status: AC
  Filled 2018-07-03: qty 1

## 2018-07-03 MED ORDER — SODIUM CHLORIDE 0.9% FLUSH
3.0000 mL | Freq: Two times a day (BID) | INTRAVENOUS | Status: DC
  Administered 2018-07-04: 3 mL via INTRAVENOUS

## 2018-07-03 MED ORDER — LIDOCAINE HCL (PF) 1 % IJ SOLN
INTRAMUSCULAR | Status: AC
  Filled 2018-07-03: qty 30

## 2018-07-03 MED ORDER — SODIUM CHLORIDE 0.9 % WEIGHT BASED INFUSION: 1.0000 mL/kg/h | INTRAVENOUS | Status: DC

## 2018-07-03 MED ORDER — HEPARIN (PORCINE) IN NACL 1000-0.9 UT/500ML-% IV SOLN
INTRAVENOUS | Status: AC
  Filled 2018-07-03: qty 1500

## 2018-07-03 MED ORDER — IOHEXOL 350 MG/ML SOLN
INTRAVENOUS | Status: DC | PRN
  Administered 2018-07-03: 140 mL

## 2018-07-03 MED ORDER — POTASSIUM CHLORIDE CRYS ER 20 MEQ PO TBCR: 40.0000 meq | EXTENDED_RELEASE_TABLET | Freq: Once | ORAL | Status: DC

## 2018-07-03 MED ORDER — FENTANYL CITRATE (PF) 100 MCG/2ML IJ SOLN
INTRAMUSCULAR | Status: DC | PRN
  Administered 2018-07-03: 25 ug via INTRAVENOUS
  Administered 2018-07-03: 50 ug via INTRAVENOUS
  Administered 2018-07-03: 25 ug via INTRAVENOUS

## 2018-07-03 SURGICAL SUPPLY — 19 items
BALLN SAPPHIRE 2.5X12 (BALLOONS) ×2
BALLN SAPPHIRE ~~LOC~~ 2.75X12 (BALLOONS) ×2 IMPLANT
BALLOON SAPPHIRE 2.5X12 (BALLOONS) ×1 IMPLANT
CATH VISTA GUIDE 6FR XBLAD3.5 (CATHETERS) ×2 IMPLANT
CROWN DIAMONDBACK CLASSIC 1.25 (BURR) ×2 IMPLANT
DEVICE RAD COMP TR BAND LRG (VASCULAR PRODUCTS) ×2 IMPLANT
ELECT DEFIB PAD ADLT CADENCE (PAD) ×2 IMPLANT
GLIDESHEATH SLEND SS 6F .021 (SHEATH) ×2 IMPLANT
GUIDEWIRE INQWIRE 1.5J.035X260 (WIRE) ×1 IMPLANT
INQWIRE 1.5J .035X260CM (WIRE) ×2
KIT ENCORE 26 ADVANTAGE (KITS) ×2 IMPLANT
KIT HEART LEFT (KITS) ×2 IMPLANT
LUBRICANT VIPERSLIDE CORONARY (MISCELLANEOUS) ×2 IMPLANT
PACK CARDIAC CATHETERIZATION (CUSTOM PROCEDURE TRAY) ×2 IMPLANT
STENT SIERRA 2.50 X 15 MM (Permanent Stent) ×2 IMPLANT
TRANSDUCER W/STOPCOCK (MISCELLANEOUS) ×2 IMPLANT
TUBING CIL FLEX 10 FLL-RA (TUBING) ×2 IMPLANT
WIRE COUGAR XT STRL 190CM (WIRE) ×2 IMPLANT
WIRE VIPER ADVANCE COR .012TIP (WIRE) ×2 IMPLANT

## 2018-07-03 NOTE — Progress Notes (Signed)
TR BAND REMOVAL  LOCATION:    right radial  DEFLATED PER PROTOCOL:    Yes.    TIME BAND OFF / DRESSING APPLIED:    1415   SITE UPON ARRIVAL:    Level 1( old bruise, soft )  SITE AFTER BAND REMOVAL:    Level 1( old bruise , soft)  CIRCULATION SENSATION AND MOVEMENT:    Within Normal Limits   Yes.    COMMENTS:   Tolerated procedure well

## 2018-07-03 NOTE — Interval H&P Note (Signed)
History and Physical Interval Note:  07/03/2018 9:03 AM  [name] Foster  has presented today for PCI of the LAD with orbital atherectomy and stenting with the diagnosis of CAD/NSTEMI.  The various methods of treatment have been discussed with the patient and family. After consideration of risks, benefits and other options for treatment, the patient has consented to  Procedure(s): CORONARY STENT INTERVENTION (N/A) as a surgical intervention .  The patient's history has been reviewed, patient examined, no change in status, stable for surgery.  I have reviewed the patient's chart and labs.  Questions were answered to the patient's satisfaction.    Cath Lab Visit (complete for each Cath Lab visit)  Clinical Evaluation Leading to the Procedure:   ACS: Yes.    Non-ACS:    Anginal Classification: CCS III  Anti-ischemic medical therapy: Maximal Therapy (2 or more classes of medications)  Non-Invasive Test Results: No non-invasive testing performed  Prior CABG: No previous CABG         Lauree Chandler

## 2018-07-03 NOTE — Progress Notes (Addendum)
Progress Note  Patient Name: [name] Foster Date of Encounter: 07/03/2018  Primary Cardiologist: Sherren Mocha, MD   Subjective   Doing well this morning.  Denies chest pain or shortness of breath.  Inpatient Medications    Scheduled Meds: . amLODipine  5 mg Oral Daily  . aspirin EC  81 mg Oral Daily  . atorvastatin  80 mg Oral q1800  . clopidogrel  75 mg Oral Daily  . hydrALAZINE  25 mg Oral Q6H  . insulin aspart  0-15 Units Subcutaneous TID WC  . isosorbide mononitrate  15 mg Oral Daily  . losartan  100 mg Oral Daily  . metoprolol tartrate  50 mg Oral BID  . sodium chloride flush  3 mL Intravenous Q12H  . sodium chloride flush  3 mL Intravenous Q12H   Continuous Infusions: . sodium chloride Stopped (07/03/18 0421)  . sodium chloride    . sodium chloride    . sodium chloride 1 mL/kg/hr (07/03/18 0515)  . heparin 1,600 Units/hr (07/03/18 0349)   PRN Meds: sodium chloride, sodium chloride, acetaminophen, alum & mag hydroxide-simeth, HYDROcodone-acetaminophen, nitroGLYCERIN, ondansetron (ZOFRAN) IV, sodium chloride flush, sodium chloride flush   Vital Signs    Vitals:   07/03/18 0233 07/03/18 0349 07/03/18 0624 07/03/18 0735  BP:  (!) 152/101 (!) 164/86 (!) 161/85  Pulse:    71  Resp:  16  15  Temp:  98.1 F (36.7 C)  97.8 F (36.6 C)  TempSrc:  Oral  Oral  SpO2:    100%  Weight: 85.2 kg     Height:        Intake/Output Summary (Last 24 hours) at 07/03/2018 0826 Last data filed at 07/03/2018 0600 Gross per 24 hour  Intake 3467.14 ml  Output 0 ml  Net 3467.14 ml   Filed Weights   07/01/18 0500 07/02/18 0500 07/03/18 0233  Weight: 85.2 kg 86.4 kg 85.2 kg    Telemetry    Sinus bradycardia--> NSR - Personally Reviewed  ECG    None performed today - Personally Reviewed  Physical Exam   GEN: elderly male, well-developed, well-nourished, in no acute distress.   Neck: No JVD Cardiac: RRR, no murmurs, rubs, or gallops.  Respiratory: Clear to  auscultation bilaterally. GI: Soft, nontender, non-distended  MS: No edema; No deformity. Neuro: no focal deficits   Psych: Normal affect   Labs    Chemistry Recent Labs  Lab 06/30/18 0828 07/01/18 0334 07/03/18 0241  NA 141 139 140  K 4.2 3.6 3.4*  CL 107 106 109  CO2 27 24 22   GLUCOSE 186* 140* 128*  BUN 13 11 10   CREATININE 1.03 1.10 1.02  CALCIUM 9.3 8.8* 8.5*  GFRNONAA >60 >60 >60  GFRAA >60 >60 >60  ANIONGAP 7 9 9      Hematology Recent Labs  Lab 07/01/18 0334 07/02/18 0259 07/03/18 0241  WBC 4.3 4.3 3.9*  RBC 4.41 4.08* 4.31  HGB 12.8* 11.9* 12.3*  HCT 39.0 36.8* 38.9*  MCV 88.4 90.2 90.3  MCH 29.0 29.2 28.5  MCHC 32.8 32.3 31.6  RDW 14.4 14.5 14.3  PLT 144* 113* 126*    Cardiac Enzymes Recent Labs  Lab 06/30/18 1115 06/30/18 1546 06/30/18 2153 07/01/18 0334  TROPONINI <0.03 <0.03 0.13* 0.19*   No results for input(s): TROPIPOC in the last 168 hours.   BNP Recent Labs  Lab 07/03/18 0241  BNP 239.8*     DDimer No results for input(s): DDIMER in the last 168 hours.  Radiology    Dg Chest 2v Repeat Same Day  Result Date: 07/02/2018 CLINICAL DATA:  Dyspnea EXAM: CHEST - 2 VIEW SAME DAY COMPARISON:  Two days ago FINDINGS: Generalized interstitial coarsening. Possible cephalized blood flow. No pleural effusion. Normal heart size and negative mediastinal contours. IMPRESSION: Interstitial coarsening that could be congestive or bronchitic. No change from 2 days prior. Electronically Signed   By: Monte Fantasia M.D.   On: 07/02/2018 15:36    Cardiac Studies   Conclusions: 1. Significant mid LAD disease of up to 70% that appears similar to prior catheterization but is positive by hemodynamic assessment today (DFR 0.71). 2. Diffuse moderate to severe diagonal disease. 3. Patent stents in LCx/OM and lPDA. 4. Stable 50% mid RCA stenosis (non-dominant). 5. Normal left ventricular filling pressure and systolic  function.  Recommendations: 1. Plan for orbital atherectomy and PCI to moderately to severely calcified mid LAD stenosis on Monday (07/03/18).  2. Restart heparin infusion 2 hours after TR band removal. 3. Continue dual antiplatelet with aspirin and clopidogrel. 4. Aggressive secondary prevention.  Recommend dual antiplatelet therapy with Aspirin 81mg  daily and Clopidogrel 75mg  daily long-term (beyond 12 months) because of prior STEMI and multivessel PCI.   Nelva Bush, MD [facility] Pager: [phone]    Diagnostic Diagram         Patient Profile     62 y.o. male with PMH CAD (s/p inferior STEMI in 03/2017 with DES to LCx, DES to PDA and PTCA alone to Ostial PDA in 09/2017, residual 65% Proximal LAD stenosis not significant by FFR), HTN, HLD, and T2DM who presented with chest pain concerning for ACS.   Assessment & Plan    1. Chest pain/ACS: Hx of inferior STEMI in 03/2017 with DES to LCx, DES to PDA and PTCA alone to Ostial PDA in 09/2017. Has residual 65% Proximal LAD stenosis not significant by FFR.  He underwent LHC 10-18 which showed significant mid LAD disease of up to 70% similar to prior cath but with DFR 0.71, diffuse moderate to severe diagonal disease and patent stents to LCx and IPDA.  Plan for orbital atherectomy and PCI to LAD today. Currently chest pain free.  - Continue BB, ARB, and high intensity statin  - Continue DAPT with ASA and Plavix  2. HTN: History of uncontrolled hypertension requiring up-titration on home meds recently. Current on metoprolol 50 BID, losartan 100, Imdur 15, amlodipine 10, and hydralazine 25 mg QID. Systolic BP 505-697X. Will likely need adjustment in regimen, consider increasing hydralazine 25-> 50 mg. Would not up titrate BB in the setting of bradycardia.    3. HLD: Continue high intensity statin.   4. T2DM: Non-insulin-dependent.  On metformin at home.  Continue SSI with goal BG < 180.   5. Dyspnea: s/p IV Lasix 20 mg  x1. Denies SOB this AM.   For questions or updates, please contact [place] Please consult [email or web address] for contact info under    Signed, Welford Roche, MD  07/03/2018, 8:26 AM    Patient seen, examined. Available data reviewed. Agree with findings, assessment, and plan as outlined by Dr Isac Sarna.  Patient is seen following PCI of the LAD with atherectomy and drug-eluting stent implantation.  His right radial artery site is clear.  He is awake, alert and oriented to all spheres.  JVP is normal, lungs are clear, heart is regular rate and rhythm with no murmurs or gallops, abdomen is soft nontender,.  I have reviewed the patient's cardiac catheterization films  and he had an excellent result with atherectomy and stenting of the LAD today.  He will continue on dual antiplatelet therapy with aspirin and clopidogrel.  He is treated with metoprolol, isosorbide, losartan, and amlodipine.  Anticipate hospital discharge tomorrow.  Sherren Mocha, M.D. 07/03/2018 12:28 PM

## 2018-07-03 NOTE — Progress Notes (Signed)
ANTICOAGULATION CONSULT NOTE - Follow Up Consult  Pharmacy Consult for heparin Indication: chest pain/ACS  No Known Allergies  Patient Measurements: Height: 5\' 8"  (172.7 cm) Weight: 187 lb 13.3 oz (85.2 kg) IBW/kg (Calculated) : 68.4 Heparin Dosing Weight: 87kg  Vital Signs: Temp: 98.1 F (36.7 C) (10/21 0349) Temp Source: Oral (10/21 0349) BP: 164/86 (10/21 0624) Pulse Rate: 66 (10/20 2327)  Labs: Recent Labs    06/30/18 0828  06/30/18 1129 06/30/18 1546 06/30/18 2153 07/01/18 0334  07/01/18 2151 07/02/18 0259 07/03/18 0241  HGB 13.9  --   --   --   --  12.8*  --   --  11.9* 12.3*  HCT 42.9  --   --   --   --  39.0  --   --  36.8* 38.9*  PLT 156  --   --   --   --  144*  --   --  113* 126*  APTT  --   --  27  --   --   --   --   --   --   --   LABPROT  --   --   --   --   --  12.9  --   --  13.7 13.7  INR  --   --   --   --   --  0.98  --   --  1.06 1.06  HEPARINUNFRC  --   --   --   --   --  <0.10*   < > 0.28* 0.52 0.66  CREATININE 1.03  --   --   --   --  1.10  --   --   --  1.02  TROPONINI <0.03   < >  --  <0.03 0.13* 0.19*  --   --   --   --    < > = values in this interval not displayed.    Estimated Creatinine Clearance: 78.7 mL/min (by C-G formula based on SCr of 1.02 mg/dL).   Medical History: Past Medical History:  Diagnosis Date  . CAD in native artery    a. 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 DES to left PDA 09/20/17  . Diabetes mellitus without complication ([place])   . High cholesterol   . Hyperlipidemia   . Hypertension   . Insomnia   . Pulmonary hypertension ([place])    a. TTE 03/25/17; EF 60-65%, nl WM, LV diastolic fxn nl, trivial AI, RV cavity size, wall thickness, and sys fxn nl, trivial TR, PASP 39 mmHg, trivial pericardial effusion  . Sleep apnea    could not use a cpap  . Wears dentures    full top-partial bottom   . Wears glasses    . sodium chloride Stopped (07/03/18 0421)  . sodium chloride    . sodium chloride    . sodium chloride 1 mL/kg/hr (07/03/18 0515)  . heparin 1,600 Units/hr (07/03/18 0349)     Assessment: 8 yoM s/p LHC with heavily calcified multivessel disease. Pharmacy consulted to resume IV heparin after initial cath with plans for staged PCI and atherectomy on 10/21.  Heparin level theraperutic. CBC stable, no bleeding or complications noted.  Goal of Therapy:  Heparin level 0.3-0.7 units/ml Monitor platelets by anticoagulation protocol: Yes   Plan:  -Continue IV heparin 1600 units/hr -Follow-up after cath  Arrie Senate, PharmD, BCPS Clinical Pharmacist [phone] Please check AMION for all [place] numbers 07/03/2018

## 2018-07-04 ENCOUNTER — Telehealth: Payer: Self-pay | Admitting: Cardiovascular Disease

## 2018-07-04 LAB — GLUCOSE, CAPILLARY
GLUCOSE-CAPILLARY: 120 mg/dL — AB (ref 70–99)
Glucose-Capillary: 113 mg/dL — ABNORMAL HIGH (ref 70–99)

## 2018-07-04 LAB — BASIC METABOLIC PANEL
Anion gap: 7 (ref 5–15)
BUN: 8 mg/dL (ref 8–23)
CHLORIDE: 107 mmol/L (ref 98–111)
CO2: 25 mmol/L (ref 22–32)
CREATININE: 1.05 mg/dL (ref 0.61–1.24)
Calcium: 8.8 mg/dL — ABNORMAL LOW (ref 8.9–10.3)
GFR calc non Af Amer: >60 mL/min (ref >60)
Glucose, Bld: 128 mg/dL — ABNORMAL HIGH (ref 70–99)
POTASSIUM: 3.8 mmol/L (ref 3.5–5.1)
Sodium: 139 mmol/L (ref 135–145)

## 2018-07-04 LAB — PROTIME-INR
INR: 0.98
Prothrombin Time: 12.9 seconds (ref 11.4–15.2)

## 2018-07-04 LAB — HEPATIC FUNCTION PANEL
ALBUMIN: 3.2 g/dL — AB (ref 3.5–5.0)
ALT: 72 U/L — ABNORMAL HIGH (ref 0–44)
AST: 64 U/L — AB (ref 15–41)
Alkaline Phosphatase: 52 U/L (ref 38–126)
Bilirubin, Direct: 0.1 mg/dL (ref 0.0–0.2)
Indirect Bilirubin: 0.5 mg/dL (ref 0.3–0.9)
TOTAL PROTEIN: 5.6 g/dL — AB (ref 6.5–8.1)
Total Bilirubin: 0.6 mg/dL (ref 0.3–1.2)

## 2018-07-04 LAB — CBC
HEMATOCRIT: 37.3 % — AB (ref 39.0–52.0)
Hemoglobin: 12.1 g/dL — ABNORMAL LOW (ref 13.0–17.0)
MCH: 29.1 pg (ref 26.0–34.0)
MCHC: 32.4 g/dL (ref 30.0–36.0)
MCV: 89.7 fL (ref 80.0–100.0)
PLATELETS: 116 10*3/uL — AB (ref 150–400)
RBC: 4.16 MIL/uL — AB (ref 4.22–5.81)
RDW: 14.3 % (ref 11.5–15.5)
WBC: 3.4 10*3/uL — ABNORMAL LOW (ref 4.0–10.5)
nRBC: 0 % (ref 0.0–0.2)

## 2018-07-04 MED ORDER — HYDRALAZINE HCL 50 MG PO TABS: 50.0000 mg | ORAL_TABLET | Freq: Three times a day (TID) | ORAL | 1 refills | Status: DC

## 2018-07-04 MED ORDER — STUDY - AEGIS II STUDY - PLACEBO OR CSL112 (PI-HILTY)
170.0000 mL | INTRAVENOUS | Status: DC
  Administered 2018-07-04: 11:00:00 170 mL via INTRAVENOUS
  Filled 2018-07-04: qty 170

## 2018-07-04 MED ORDER — AMLODIPINE BESYLATE 5 MG PO TABS: 5.0000 mg | ORAL_TABLET | Freq: Every day | ORAL | 1 refills | Status: DC

## 2018-07-04 MED ORDER — HYDRALAZINE HCL 50 MG PO TABS
50.0000 mg | ORAL_TABLET | Freq: Three times a day (TID) | ORAL | Status: DC
  Administered 2018-07-04: 50 mg via ORAL
  Filled 2018-07-04: qty 1

## 2018-07-04 MED FILL — Nitroglycerin IV Soln 200 MCG/ML in D5W: INTRAVENOUS | Qty: 250 | Status: AC

## 2018-07-04 NOTE — Telephone Encounter (Signed)
**Note De-identified [name] Foster Obfuscation** The pt is being discharged from the hospital today. We will call tomorrow. 

## 2018-07-04 NOTE — Progress Notes (Addendum)
AEGIS-II research study:  Patient seen an examined prior to randomization and infusion.  Physical Exam: General appearance: alert Neck: no adenopathy, no carotid bruit, no JVD, supple, symmetrical, trachea midline and thyroid not enlarged, symmetric, no tenderness/mass/nodules Lungs: clear to auscultation bilaterally Heart: regular rate and rhythm, S1, S2 normal, no murmur, click, rub or gallop Abdomen: soft, non-tender; bowel sounds normal; no masses,  no organomegaly Extremities: extremities normal, atraumatic, no cyanosis or edema Pulses: 2+ and symmetric Skin: Skin color, texture, turgor normal. No rashes or lesions Neurologic: Grossly normal  [place] Class:  Yes, Class I  The patient was given the opportunity to ask any further questions about the study that were not addressed by the research nurse coordinators - he has no further questions and wants to proceed at this time.  SignedMachi, Whittaker, NP-C 07/04/2018, 10:25 AM Pager: [phone]

## 2018-07-04 NOTE — Research (Signed)
AEGIS Informed Consent   Subject Name: [name] Foster  Subject met inclusion and exclusion criteria.  The informed consent form, study requirements and expectations were reviewed with the subject and questions and concerns were addressed prior to the signing of the consent form.  The subject verbalized understanding of the trail requirements.  The subject agreed to participate in the AEGIS trial and signed the informed consent.  The informed consent was obtained prior to performance of any protocol-specific procedures for the subject.  A copy of the signed informed consent was given to the subject and a copy was placed in the subject's medical record.  TERRELL, AMY 07/04/2018, 0830 AM  

## 2018-07-04 NOTE — Discharge Summary (Signed)
Discharge Summary    Patient ID: [name] Foster,  MRN: [id number], DOB/AGE: [birth year] 62 y.o.  Admit date: 06/30/2018 Discharge date: 07/04/2018  Primary Care Provider: [name] Primary Cardiologist: Dr. Burt Knack   Discharge Diagnoses    Active Problems:   Non-ST elevation (NSTEMI) myocardial infarction [facility])   Essential hypertension   Dyslipidemia, goal LDL below 70   Hypokalemia   Unstable angina (HCC)   Allergies No Known Allergies  Diagnostic Studies/Procedures    Cath: 06/30/18  Conclusions: 1. Significant mid LAD disease of up to 70% that appears similar to prior catheterization but is positive by hemodynamic assessment today (DFR 0.71). 2. Diffuse moderate to severe diagonal disease. 3. Patent stents in LCx/OM and lPDA. 4. Stable 50% mid RCA stenosis (non-dominant). 5. Normal left ventricular filling pressure and systolic function.  Recommendations: 1. Plan for orbital atherectomy and PCI to moderately to severely calcified mid LAD stenosis on Monday (07/03/18). 2. Restart heparin infusion 2 hours after TR band removal. 3. Continue dual antiplatelet with aspirin and clopidogrel. 4. Aggressive secondary prevention.  Recommend dual antiplatelet therapy with Aspirin 81mg  daily and Clopidogrel 75mg  daily long-term (beyond 12 months) because of prior STEMI and multivessel PCI.   Nelva Bush, MD  Cath: 07/03/18   Prox LAD lesion is 70% stenosed.  A drug-eluting stent was successfully placed using a STENT SIERRA 2.50 X 15 MM.  Post intervention, there is a 0% residual stenosis.   1. Severe, heavily calcified mid LAD stenosis 2. Successful PTCA/orbital atherectomy/DES x 1 mid LAD  Recommendations:Likely discharge home tomorrow.  Recommend uninterrupted dual antiplatelet therapy with Aspirin 81mg  daily and Clopidogrel 75mg  daily for a minimum of 12 months (ACS - Class I recommendation). _____________   History of Present Illness    62  y.o. male with past medical history of CAD (s/p inferior STEMI in 03/2017 with DES to LCx, DES to PDA and PTCA alone to Ostial PDA in 09/2017, residual 65% Proximal LAD stenosis not significant by FFR), HTN, HLD, and Type 2 DM who presented for the evaluation of chest pain.    [name] was last examined by Dr. Burt Knack in 02/2018 and denied any recurrent chest pain at that time. Was still having episodes of shortness of breath after taking his medications, therefore he was switched from Brilinta to Plavix 75mg  daily and continued on ASA 81mg  daily.   In talking with the patient on admission, he reported being in his usual state of health over the past few weeks and traveled to [place] on Tuesday to help his daughter move furniture. He denied any actual anginal symptoms during this process but starting the day after, he developed sharp chest discomfort along his left pectoral region. Symptoms presented intermittently throughout the day and were relieved with sublingual nitroglycerin. He reports associated dyspnea with this. Symptoms were not worse with palpation or positional changes. The evening before admission, he was awoken from sleep with recurrent chest discomfort, dyspnea, and diaphoresis. He again took sublingual nitroglycerin with resolution of his pain. Reported symptoms are identical to what he experienced in 03/2017 at the time of his MI. Had been compliant with his medication regimen and denied missing any recent doses.  Initial labs show WBC 4.6, Hgb 13.9, platelets 156, Na+ 141, K+ 4.2, and creatinine 1.03. Initial troponin value negative.  CXR shows mild degree of vascular congestion. EKG shows sinus bradycardia, heart rate 57, with no acute ST changes when compared to prior tracings. He was admitted with  plans to undergo cath.    Hospital Course     Underwent cardiac cath noted above with patent stents in the Lcx/OM and IPDA with significant mLAD disease with DFR 0.71. He underwent  staged intervention to the mLAD with orbital atherectomy and PCI. Troponin peaked at 0.19. Plan for DAPT with ASA/plavix for at least one year. He was continued on high dose statin. LDL noted at 81. Home blood pressure medications were continued but added Norvasc and hydralazine given his elevated blood pressures. Worked well with cardiac rehab without recurrent chest pain.   General: Well developed, well nourished, male appearing in no acute distress. Head: Normocephalic, atraumatic.  Neck: Supple without bruits, JVD. Lungs:  Resp regular and unlabored, CTA. Heart: RRR, S1, S2, no S3, S4, or murmur; no rub. Abdomen: Soft, non-tender, non-distended with normoactive bowel sounds. No hepatomegaly. No rebound/guarding. No obvious abdominal masses. Extremities: No clubbing, cyanosis, edema. Distal pedal pulses are 2+ bilaterally. R radial cath site stable without bruising or hematoma Neuro: Alert and oriented X 3. Moves all extremities spontaneously. Psych: Normal affect.  [name] was seen by Dr. Angelena Form and determined stable for discharge home. Follow up in the office has been arranged. Medications are listed below.   _____________  Discharge Vitals Blood pressure (!) 170/90, pulse 77, temperature 97.9 F (36.6 C), temperature source Oral, resp. rate 16, height 5\' 8"  (1.727 m), weight 86 kg, SpO2 97 %.  Filed Weights   07/02/18 0500 07/03/18 0233 07/04/18 0617  Weight: 86.4 kg 85.2 kg 86 kg    Labs & Radiologic Studies    CBC Recent Labs    07/03/18 0241 07/04/18 0344  WBC 3.9* 3.4*  HGB 12.3* 12.1*  HCT 38.9* 37.3*  MCV 90.3 89.7  PLT 126* 983*   Basic Metabolic Panel Recent Labs    07/03/18 0241 07/04/18 0344  NA 140 139  K 3.4* 3.8  CL 109 107  CO2 22 25  GLUCOSE 128* 128*  BUN 10 8  CREATININE 1.02 1.05  CALCIUM 8.5* 8.8*   Liver Function Tests Recent Labs    07/04/18 0344  AST 64*  ALT 72*  ALKPHOS 52  BILITOT 0.6  PROT 5.6*  ALBUMIN 3.2*   No  results for input(s): LIPASE, AMYLASE in the last 72 hours. Cardiac Enzymes No results for input(s): CKTOTAL, CKMB, CKMBINDEX, TROPONINI in the last 72 hours. BNP Invalid input(s): POCBNP D-Dimer No results for input(s): DDIMER in the last 72 hours. Hemoglobin A1C No results for input(s): HGBA1C in the last 72 hours. Fasting Lipid Panel No results for input(s): CHOL, HDL, LDLCALC, TRIG, CHOLHDL, LDLDIRECT in the last 72 hours. Thyroid Function Tests No results for input(s): TSH, T4TOTAL, T3FREE, THYROIDAB in the last 72 hours.  Invalid input(s): FREET3 _____________  Dg Chest 2 View  Result Date: 06/30/2018 CLINICAL DATA:  Left-sided chest pain and mild shortness-of-breath since yesterday. EXAM: CHEST - 2 VIEW COMPARISON:  03/25/2017 FINDINGS: Lungs are adequately inflated without focal airspace consolidation or effusion. Minimal prominence of the central bronchovascular markings which may be due to mild degree of vascular congestion. Cardiomediastinal silhouette and remainder the exam is unchanged. IMPRESSION: Possible mild degree of vascular congestion. Electronically Signed   By: Marin Olp M.D.   On: 06/30/2018 09:21   Dg Chest 2v Repeat Same Day  Result Date: 07/02/2018 CLINICAL DATA:  Dyspnea EXAM: CHEST - 2 VIEW SAME DAY COMPARISON:  Two days ago FINDINGS: Generalized interstitial coarsening. Possible cephalized blood flow. No pleural effusion.  Normal heart size and negative mediastinal contours. IMPRESSION: Interstitial coarsening that could be congestive or bronchitic. No change from 2 days prior. Electronically Signed   By: Monte Fantasia M.D.   On: 07/02/2018 15:36   Disposition   Pt is being discharged home today in good condition.  Follow-up Plans & Appointments    Follow-up Information    [place], Crista Luria, [place] Follow up on 07/13/2018.   Specialty:  Cardiology Why:  at 9am for your follow up appt.  Contact information: [place]  [id number] [phone]          Discharge Instructions    Amb Referral to Cardiac Rehabilitation   Complete by:  As directed    Diagnosis:   Coronary Stents NSTEMI     Call MD for:  redness, tenderness, or signs of infection (pain, swelling, redness, odor or green/yellow discharge around incision site)   Complete by:  As directed    Diet - low sodium heart healthy   Complete by:  As directed    Discharge instructions   Complete by:  As directed    Radial Site Care Refer to this sheet in the next few weeks. These instructions provide you with information on caring for yourself after your procedure. Your caregiver may also give you more specific instructions. Your treatment has been planned according to current medical practices, but problems sometimes occur. Call your caregiver if you have any problems or questions after your procedure. HOME CARE INSTRUCTIONS You may shower the day after the procedure.Remove the bandage (dressing) and gently wash the site with plain soap and water.Gently pat the site dry.  Do not apply powder or lotion to the site.  Do not submerge the affected site in water for 3 to 5 days.  Inspect the site at least twice daily.  Do not flex or bend the affected arm for 24 hours.  No lifting over 5 pounds (2.3 kg) for 5 days after your procedure.  Do not drive home if you are discharged the same day of the procedure. Have someone else drive you.  You may drive 24 hours after the procedure unless otherwise instructed by your caregiver.  What to expect: Any bruising will usually fade within 1 to 2 weeks.  Blood that collects in the tissue (hematoma) may be painful to the touch. It should usually decrease in size and tenderness within 1 to 2 weeks.  SEEK IMMEDIATE MEDICAL CARE IF: You have unusual pain at the radial site.  You have redness, warmth, swelling, or pain at the radial site.  You have drainage (other than a small amount of blood on the dressing).  You  have chills.  You have a fever or persistent symptoms for more than 72 hours.  You have a fever and your symptoms suddenly get worse.  Your arm becomes pale, cool, tingly, or numb.  You have heavy bleeding from the site. Hold pressure on the site.   PLEASE DO NOT MISS ANY DOSES OF YOUR PLAVIX!!!!! Also keep a log of you blood pressures and bring back to your follow up appt. Please call the office with any questions.   Patients taking blood thinners should generally stay away from medicines like ibuprofen, Advil, Motrin, naproxen, and Aleve due to risk of stomach bleeding. You may take Tylenol as directed or talk to your primary doctor about alternatives.   Increase activity slowly   Complete by:  As directed  Discharge Medications     Medication List    TAKE these medications   AMBIEN 10 MG tablet Generic drug:  zolpidem Take 10 mg by mouth at bedtime as needed for sleep.   amLODipine 5 MG tablet Commonly known as:  NORVASC Take 1 tablet (5 mg total) by mouth daily.   aspirin 81 MG EC tablet TAKE 1 TABLET BY MOUTH EVERY DAY   atorvastatin 80 MG tablet Commonly known as:  LIPITOR TAKE 1 TABLET (80 MG TOTAL) BY MOUTH DAILY AT 6 PM.   clopidogrel 75 MG tablet Commonly known as:  PLAVIX Take 1 tablet (75 mg total) by mouth daily.   hydrALAZINE 50 MG tablet Commonly known as:  APRESOLINE Take 1 tablet (50 mg total) by mouth every 8 (eight) hours.   HYDROcodone-acetaminophen 7.5-325 MG tablet Commonly known as:  NORCO Take 1 tablet by mouth every 6 (six) hours as needed for moderate pain.   isosorbide mononitrate 30 MG 24 hr tablet Commonly known as:  IMDUR Take 0.5 tablets (15 mg total) by mouth daily.   losartan 100 MG tablet Commonly known as:  COZAAR Take 1 tablet (100 mg total) by mouth daily.   metFORMIN 1000 MG tablet Commonly known as:  GLUCOPHAGE Take 500 mg by mouth 2 (two) times daily with a meal.   metoprolol tartrate 50 MG tablet Commonly  known as:  LOPRESSOR Take 1 tablet (50 mg total) by mouth 2 (two) times daily. What changed:    how much to take  additional instructions   nitroGLYCERIN 0.4 MG SL tablet Commonly known as:  NITROSTAT Place 1 tablet (0.4 mg total) under the tongue every 5 (five) minutes x 3 doses as needed for chest pain.        Acute coronary syndrome (MI, NSTEMI, STEMI, etc) this admission?: No.     Outstanding Labs/Studies   N/a   Duration of Discharge Encounter   Greater than 30 minutes including physician time.  Signed, Lonn Im NP-C 07/04/2018, 11:28 AM  I have personally seen and examined this patient. I agree with the assessment and plan as outlined above.  Doing well this am post orbital atherectomy and stenting of the mid LAD. No chest pain. Labs stable.  Discharge today on ASA and Plavix. Norvasc and Hydralazine added for better BP control.   Lauree Chandler 07/04/2018 11:28 AM

## 2018-07-04 NOTE — Progress Notes (Signed)
CARDIAC REHAB PHASE I   PRE:  Rate/Rhythm: 83 SR  BP:  Sitting: 185/89      SaO2: 98 RA  MODE:  Ambulation: 600 ft 93 peak HR  POST:  Rate/Rhythm: 79 SR  BP:  Sitting: 173/89    SaO2: 97 RA   Pt ambulated 62ft in hallway independently with steady gait. Pt denies CP, does c/o some SOB. Pt educated on importance of ASA, Plavix, statin, and NTG. MI book and stent card at bedside. Pt given heart healthy and diabetic diets. Reviewed restrictions and exercise guidelines. Will refer back to CRP II [place]. Pt hopeful for d/c today.  [id number] Rufina Falco, RN BSN 07/04/2018 9:40 AM

## 2018-07-04 NOTE — Telephone Encounter (Signed)
New Message    [facility] appointment made on 07/13/18 at 9:00a with [facility] per Mendel Ryder

## 2018-07-05 NOTE — Telephone Encounter (Signed)
Patient contacted regarding discharge from [facility] on 07/04/18.  Patient understands to follow up with B. Bhagat on 07/13/18 at 9:00 am at [facility] office.  Patient understands discharge instructions? Yes  Patient understands medications and regiment? Yes see below. Patient understands to bring all medications to this visit? Yes   Pt continues asa/plavix. Amlodipine and hydralazine were added and he has picked them up. At home he was taking metoprolol differently than list (50-am and 100-pm). Adv pt to decrease to 50 mg BID as listed on instructions due to addition of 2 new BP meds. Clarified that he is to take Lipitor 80 mg. - pt questionned this because his "numbers were low" and he was told to only take 40 mg daily previously.  Now verbalizes understanding.  Cath site is ecchymotic. Denies tingling, numbness.

## 2018-07-07 NOTE — Research (Signed)
Visit 2 infusion 1  Patient feeling great. No complaints of cp or sob.                                    "CONSENT"   YES     NO   Continuing further Investigational Product and study visits for follow-up? [x]  []   Continuing consent from future biomedical research [x]  []                                    "EVENTS"    YES     NO  AE   (IF YES SEE SOURCE) []  [x]   SAE  (IF YES SEE SOURCE) []  [x]   ENDPOINT   (IF YES SEE SOURCE) []  [x]   REVASCULARIZATION  (IF YES SEE SOURCE) []  [x]   AMPUTATION   (IF YES SEE SOURCE) []  [x]   TROPONIN'S  (IF YES SEE SOURCE) []  [x] 

## 2018-07-07 NOTE — Research (Signed)
Visit 1 and 2   Inclusion/Exclusion Checklist:   Inclusions:   Y N   _0  _1  Male or male at least 63 years of age  _2  _3  Evidence of type I (spontaneous) MI (STEMI or NSTEMI) caused by atherothrombotic artery disease as defined by the following:  _4  _5  a. Detection of a rise and/or fall in Troponin I or T with at least 1 value about the 99% upper reference limit.     (AND)---  Any 1 or more of the following:   _6  _7  - symptoms of ischemia (ie, resulting from a primary coronary    artery event)  _8  _9  - New or presumably new significant ST/T wave changes or left bundle branch block.  _10  _11       - Development of pathological Q waves on EKG  _12  _13  - Imaging evidence of new loss or viable myocardium or regional wall motion abnormality.  _14  _15  - ID of intracoronary thrombus by angiography.  _16  _17  No suspicion of acute kidney injury at least 12 hours after angiography OR after first medical contract for subject's not undergoing angiography There must be documented evidence of stable renal function defined as no more than an increase in Serum Creatinine < 0.42m/dl from pre-contrast serum creatinine value.  (Before _1.03__   12 hrs after _1.05___)    Evidence of multi-vessel coronary artery disease defined as:  _18  _19  A. At least 50% stenosis of theleft main coronary artery or at least 2 epicardial coronary artery territories (LAD, LCx, RCA) on catherization performed during the index hospitalization.   _20  _21  B. Prior cardiac catherization documenting @ least 50% stenosis of the LM or at least 2 epicardial >1 epicardial artery territories (LAD, LCx, RCA)  _22  _23  C. Prior PCI and evidence of 50% stenosis of at least 1 epicardial coronary artery territory different from prior revascularized artery territory.   _24  _25  D. Prior multivessel coronary artery bypass grafting.  _26  _27  Plus either Established risk factors:  _28  _29  o On pharmacological treatment for diabetes mellitus                 OR     TWO of the following  _30  _31  o Prior history of MI  _32  _33  o Age ? 64 years  _34  _35  o Peripheral arterial disease defined as meeting at least 1 of the following criteria:  _36  _37          +   Current intermittent claudication or resting limb ischemia and ABI    ?0.90  _38  _39          +   History of peripheral revascularization (surgical or percutaneous)  _40  _41          +  History of limb amputation due to PAD  _42  _43          +  Angiographic evidence (using computed tomographic angiography, MRA, or invasive angiography or a peripheral artery stenosis ?50%.  _44  _45  If the male subject without child bearing potential, not breastfeeding, not pregnant, and if of child bearing potential agree to contraception or lifestyle methods to avoid pregnancy? Child-bearing potential (must select all)   ____ not pregnant (by urine or serum hCG AND   ____ willing to use an acceptable method of contraception to avoid pregnancy during the study and for 3 months after last dose of investional product (refer to acceptable methods per protocol)   ____ if breastfeeding, willing to cease breastfeeding Date of pregnancy test: (____ /  _____/ ________)  Result  - or + Not of Child bearing potential (select one)   ____ Age >= 94   ____ Age 63-60 with amenorrhea for at least 1 year with documented evidence of follicle-stimulating hormone level >40 IU/L   ____ Surgically sterile for at least 3 months prior to randomization  _0  _1  Investigator believes that the subject is willing and able to adhere to all protocol requirements.   _2  _3  Willing to not participate in another investigational study until completion of their final study visit.     Exclusions:  Y N   _4  _5  If these are the reason for MI (pt is excluded)  _6  _7  1. Myocardial necrosis due mismatch between myocardial oxygen demand and supply, usually due to fixed coronary disease with increased demand leading to MI  _8  _9  2. Cardiac death due to MI  _10  _11   3. Myocardial necrosis due to complications from a PCI  _12  _13  4. Myocardial necrosis due to stent thrombosis  _14  _15  5. Myocardial necrosis due to in stent restenosis as the only etiology  _16  _17  6. Myocardial necrosis in the stenting of coronary artery bypass grafting  _18  _19  Ongoing hemodynamic instability  _20  _21       +  History of NYHA Class III or IV heart failure within the last year  _22  _23       +  Killip Class III or IV heart failure  _24  _25       +  Sustained and/or symptomatic hypotension (SBP <90 mm HG)  _26  _27       +  Known left ventricular ejection fraction of <30%  _28  _29  Evidence of hepatobiliary disease as indicated by any 1 or more of the   following at screening:  _30  _31       +  Current active hepatic dysfunction or active biliary obstruction  _32  _33       +       +  Chronic or prior history of cirrhosis or of infectious / inflammatory hepatitis NOTE: If a patient has a medical history of recovered Hep A, B, or C without evidence of cirrhosis, he/she could be considered for inclusion if there is documented evidence that there is no active infection (ie, antigen negative)  _34  _35       + Hepatic lab abnormalities: ALT > 3 x ULN or Total bilirubin > 2x ULN at randomization.  _36  _37  Severe chronic kidney disease (eGFR of <56m) or on dialysis  _38  _39  Plan to undergo scheduled coronary artery bypass graft surgery after randomization, as determined at the time of screening  _40  _41  Known history of allergies to soybeans, peanuts, albumin  _42  _43  Body weight <50 kg  _44  _45  A known history of IgA deficiency or antibodies to IgA  _46  _47  A comorbid condition with an estimated life expectancy of ? 6 months at time of consent  _48  _49  Women who are pregnant or breastfeeding at time of randomization  _50  _51  Participated in another interventional clinical study at the time of consent  _52  _53  Treatment with anticancer therapy  _54  _55  Previously randomized or participated in this study or  previously exposed to CSL112    DEMOGRAPHICS:  Patient Name: [name] OyamaBirth Date: 012-05-1956 Sex: Male  Race: white  Child Bearing: ? Yes    ? No ? Tubial ligation ? Hysterectomy  ? postmenopausal   Height: 172 cm Weight: 86 kg   Index Procedure:  Onset date of  symptoms: 17-Oct-19 Onset of symptoms: 1800  Date of First contact at hospital: 18-Oct-19 Time of first contact at hospital: 0815  Admission Date: 18-Oct-19   Discharge Date: 22-Oct-19 Discharge Time: 1509   Vital Signs in ED: Date 06/30/2018    Time: 0900 BP: 203/90  Pulse: 59   Concomitant medications: Every visit: ? See med sheet  BMP Pre Contrast IV 06/30/18 @ 0828  1.03  CMP Post Contrast IV 12 hours later: 07/04/18 @ 0344  1.05 Hepatic Panel: 07/04/18 @ 0344 ALT: 72 Total Bili: 0.6 Direct Bili: 0.1   Medical History:  ? CAD ? Prior MI ? PAD  ? History of Heart Failure ? Moderate to severe valvular dx ? AFib  ? Prior Coronary Revascularization  if YES please select Yes or No below:  CABG ? Yes   ? No           PCI with stent ? Yes   ? No          PCI without stent ? Yes ? No  ? CVA if checked please select one of the following Choose an item.  ? Hypertension  ? Gilberts syndrome ? CKD   ? Hypocholesteremia ? DM ? Smoker (former quit 2018)       ? eCigarette  Killip Class Stage 1     EQ-5D-3L ?  Future Biomedical Research: Consented ? Yes     ? No If no please date they withdrew consent from biomedical research Click or tap to enter a date.  [facility] Before Start of Infusion: ? Biochemistry panel      ? Hematology     ? Immunogenicity   (30 mins before infusion)   ? Parvovirus  ? FBR sample ? PK/PD sample [facility] End of Infusion:   ? PK/PD Central Blood Draw Time: Before SOI: 07/04/18 @ 1110_ After EOI: 07/04/18 @ 1320_ Infusion Start Time: 07/04/2018 11:14 AM  Infusion End Time: 07/04/2018 1:10 PM VS before infusion: 07/04/18 @ 0807 BP: 170/90 HR: 78  EQ-5D-5L  MOBILITY:    I HAVE NO  PROBLEMS WALKING _0   I HAVE SLIGHT PROBLEMS WALKING _1   I HAVE MODERATE PROBLEMS WALKING _2   I HAVE SEVERE PROBLEMS WALKING _3   I AM UNABLE TO WALK  _4     SELF-CARE:   I HAVE NO PROBLEMS WASING OR DRESSING MYSELF  _5   I HAVE SLIGHT PROBLEMS WASHING OR DRESSING MYSELF  _6   I HAVE MODERATE PROBLEMS WASHING OR DRESSING MYSELF _7   I HAVE SEVERE PROBLEMS WASHING OR DRESSING MYSELF  _8   I HAVE SEVERE PROBLEMS WASHING OR DRESSING MYSELF  _9   I AM UNABLE TO WASH OR DRESS MYSELF _10     USUAL ACTIVITIES: (E.G. WORK/STUDY/HOUSEWORK/FAMILY OR LEISURE ACTIVITIES.    I HAVE NO PROBLEMS DOING MY USUAL ACTIVITIES _11   I HAVE SLIGHT PROBLEMS DOING MY USUAL ACTIVITIES _12   I HAVE MODERATE PROBLEMS DOING MY USUAL ACTIVIITIES _13   I HAVE SEVERE PROBLEMS DOING MY USUAL ACTIVITIES _14   I AM UNABLE TO DO MY USUAL ACTIVITIES _15     PAIN /DISCOMFORT   I HAVE NO PAIN OR DISCOMFORT _16   I HAVE SLIGHT PAIN OR DISCOMFORT _17   I HAVE MODERATE PAIN OR DISCOMFORT _18   I HAVE SEVERE PAIN OR DISCOMFORT _19   I HAVE EXTREME PAIN OR DISCOMFORT _20     ANXIETY/DEPRESSION   I AM NOT ANXIOUS OR DEPRESSED _21   I AM SLIGHTLY ANXIOUS OR DEPRESSED _22   I AM MODERATELY ANXIOUS OR DREPRESSED _23   I  AM SEVERELY ANXIOUS OR DEPRESSED _0   I AM EXTREMELY ANXIOUS OR DEPRESSED _1     SCALE OF 0-100 HOW WOULD YOU RATE TODAY?  0 IS THE WORSE AND 100 IS THE BEST HEALTH YOU CAN IMAGINE: 85   Pre Cath   06/30/18 @ 0828  Sodium 135 - 145 mmol/L 141   Potassium 3.5 - 5.1 mmol/L 4.2   Chloride 98 - 111 mmol/L 107   CO2 22 - 32 mmol/L 27   Glucose, Bld 70 - 99 mg/dL 186High    BUN 8 - 23 mg/dL 13   Creatinine, Ser 0.61 - 1.24 mg/dL 1.03   Calcium 8.9 - 10.3 mg/dL 9.3   GFR calc non Af Amer >60 mL/min >60   GFR calc Af Amer >60 mL/min >60    Post cath  07/04/18 @ 0344  Sodium 135 - 145 mmol/L 139   Potassium 3.5 - 5.1 mmol/L 3.8   Chloride 98 - 111 mmol/L 107   CO2 22 - 32 mmol/L 25   Glucose, Bld 70 - 99 mg/dL 128High    BUN 8 - 23  mg/dL 8   Creatinine, Ser 0.61 - 1.24 mg/dL 1.05   Calcium 8.9 - 10.3 mg/dL 8.8Low    GFR calc non Af Amer >60 mL/min >60      07/04/18 @ 0344  Total Protein 6.5 - 8.1 g/dL 5.6Low    Albumin 3.5 - 5.0 g/dL 3.2Low    AST 15 - 41 U/L 64High    ALT 0 - 44 U/L 72High    Alkaline Phosphatase 38 - 126 U/L 52   Total Bilirubin 0.3 - 1.2 mg/dL 0.6   Bilirubin, Direct 0.0 - 0.2 mg/dL 0.1   Indirect Bilirubin 0.3 - 0.9 mg/dL 0.5

## 2018-07-11 ENCOUNTER — Ambulatory Visit (HOSPITAL_COMMUNITY)
Admission: RE | Admit: 2018-07-11 | Discharge: 2018-07-11 | Disposition: A | Discharge status: Home / Self Care | Payer: 59 | Source: Ambulatory Visit | Attending: Internal Medicine | Admitting: Internal Medicine

## 2018-07-11 ENCOUNTER — Encounter: Payer: 59 | Admitting: *Deleted

## 2018-07-11 VITALS — BP 125/64 | HR 55

## 2018-07-11 DIAGNOSIS — Z006 Encounter for examination for normal comparison and control in clinical research program: Secondary | ICD-10-CM | POA: Insufficient documentation

## 2018-07-11 MED ORDER — STUDY - AEGIS II STUDY - PLACEBO OR CSL112 (PI-HILTY)
170.0000 mL | Freq: Once | INTRAVENOUS | Status: AC
  Administered 2018-07-11: 170 mL via INTRAVENOUS
  Filled 2018-07-11: qty 170

## 2018-07-11 NOTE — Research (Signed)
Visit 3 infusion 2  Doing well no chest pains or sob.  No labs drawn today.                                    "CONSENT"   YES     NO   Continuing further Investigational Product and study visits for follow-up? [x]  []   Continuing consent from future biomedical research [x]  []                                    "EVENTS"    YES     NO  AE   (IF YES SEE SOURCE) []  [x]   SAE  (IF YES SEE SOURCE) []  [x]   ENDPOINT   (IF YES SEE SOURCE) []  [x]   REVASCULARIZATION  (IF YES SEE SOURCE) []  [x]   AMPUTATION   (IF YES SEE SOURCE) []  [x]   TROPONIN'S  (IF YES SEE SOURCE) []  [x]     Current Outpatient Medications:  .  amLODipine (NORVASC) 5 MG tablet, Take 1 tablet (5 mg total) by mouth daily., Disp: 30 tablet, Rfl: 1 .  aspirin 81 MG EC tablet, TAKE 1 TABLET BY MOUTH EVERY DAY, Disp: 30 tablet, Rfl: 3 .  atorvastatin (LIPITOR) 80 MG tablet, TAKE 1 TABLET (80 MG TOTAL) BY MOUTH DAILY AT 6 PM., Disp: 30 tablet, Rfl: 11 .  clopidogrel (PLAVIX) 75 MG tablet, Take 1 tablet (75 mg total) by mouth daily., Disp: 30 tablet, Rfl: 11 .  hydrALAZINE (APRESOLINE) 50 MG tablet, Take 1 tablet (50 mg total) by mouth every 8 (eight) hours., Disp: 90 tablet, Rfl: 1 .  HYDROcodone-acetaminophen (NORCO) 7.5-325 MG tablet, Take 1 tablet by mouth every 6 (six) hours as needed for moderate pain., Disp: , Rfl:  .  isosorbide mononitrate (IMDUR) 30 MG 24 hr tablet, Take 0.5 tablets (15 mg total) by mouth daily., Disp: 15 tablet, Rfl: 11 .  losartan (COZAAR) 100 MG tablet, Take 1 tablet (100 mg total) by mouth daily., Disp: 30 tablet, Rfl: 11 .  metFORMIN (GLUCOPHAGE) 1000 MG tablet, Take 500 mg by mouth 2 (two) times daily with a meal., Disp: , Rfl: 6 .  metoprolol tartrate (LOPRESSOR) 50 MG tablet, Take 1 tablet (50 mg total) by mouth 2 (two) times daily., Disp: 180 tablet, Rfl: 3 .  zolpidem (AMBIEN) 10 MG tablet, Take 10 mg by mouth at bedtime as needed for sleep. , Disp: , Rfl:  .  nitroGLYCERIN (NITROSTAT) 0.4 MG SL tablet,  Place 1 tablet (0.4 mg total) under the tongue every 5 (five) minutes x 3 doses as needed for chest pain. (Patient not taking: Reported on 07/11/2018), Disp: 25 tablet, Rfl: 11 No current facility-administered medications for this visit.   Facility-Administered Medications Ordered in Other Visits:  .  AEGIS II Study - CSL112 / placebo (PI: Hilty), 170 mL, Intravenous, Once, Hilty, Nadean Corwin, MD, Last Rate: 85 mL/hr at 07/11/18 0916, 170 mL at 07/11/18 0916

## 2018-07-13 ENCOUNTER — Ambulatory Visit: Payer: 59 | Admitting: Physician Assistant

## 2018-07-13 ENCOUNTER — Encounter: Payer: Self-pay | Admitting: Physician Assistant

## 2018-07-13 ENCOUNTER — Other Ambulatory Visit: Payer: Self-pay | Admitting: Physician Assistant

## 2018-07-13 VITALS — BP 124/60 | HR 58 | Ht 68.0 in | Wt 191.1 lb

## 2018-07-13 DIAGNOSIS — E119 Type 2 diabetes mellitus without complications: Secondary | ICD-10-CM

## 2018-07-13 DIAGNOSIS — E785 Hyperlipidemia, unspecified: Secondary | ICD-10-CM | POA: Diagnosis not present

## 2018-07-13 DIAGNOSIS — I10 Essential (primary) hypertension: Secondary | ICD-10-CM | POA: Diagnosis not present

## 2018-07-13 DIAGNOSIS — I25110 Atherosclerotic heart disease of native coronary artery with unstable angina pectoris: Secondary | ICD-10-CM

## 2018-07-13 NOTE — Progress Notes (Signed)
Cardiology Office Note    Date:  07/13/2018   ID:  FREDRIC SLABACH, DOB 17-Feb-1956, MRN [id number]  PCP:  [name], DO  Cardiologist:  Dr. Burt Knack   Chief Complaint: Hospital follow up  History of Present Illness:   [name] Foster is a 62 y.o. male with past medical history of CAD (s/p inferior STEMI in 03/2017 with DES to LCx, DES to PDA and PTCA alone to Ostial PDA in 09/2017, residual 65% Proximal LAD stenosis not significant by FFR), HTN, HLD, and Type 2 DMpresents for hospital follow up.   Admitted 06/2018 with [place]. Underwent cardiac cath noted below with patent stents in the Lcx/OM and IPDA with significant mLAD disease with DFR 0.71. He underwent staged intervention to the mLAD with orbital atherectomy and PCI. Troponin peaked at 0.19. Plan for DAPT with ASA/plavix for at least one year. He was continued on high dose statin. LDL noted at 81. Home blood pressure medications were continued but added Norvasc and hydralazine given his elevated blood pressures.  Here today for follow up.  Walking 15 minutes twice a day without anginal symptoms.  Denies chest pain, shortness of breath, dizziness, headache, palpitation, orthopnea, PND, syncope, lower extremity edema or melena.  Compliant with medication.  Plan to join cardiac rehab phase 2 at [facility].    Past Medical History:  Diagnosis Date  . CAD in native artery    a. 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 DES to left PDA 09/20/17  . Diabetes mellitus without complication ([place])   . High cholesterol   . Hyperlipidemia   . Hypertension   . Insomnia   . Pulmonary hypertension ([place])    a. TTE 03/25/17; EF 60-65%, nl WM, LV diastolic fxn nl, trivial AI, RV cavity size, wall thickness, and sys fxn nl, trivial TR, PASP 39 mmHg, trivial pericardial effusion  . Sleep apnea    could not use a cpap  .  Wears dentures    full top-partial bottom  . Wears glasses     Past Surgical History:  Procedure Laterality Date  . COLONOSCOPY    . CORONARY ATHERECTOMY N/A 07/03/2018   Procedure: CORONARY ATHERECTOMY;  Surgeon: Burnell Blanks, MD;  Location: [facility];  Service: Cardiovascular;  Laterality: N/A;  . CORONARY STENT INTERVENTION N/A 09/20/2017   Procedure: CORONARY STENT INTERVENTION;  Surgeon: Leonie Man, MD;  Location: [facility];  Service: Cardiovascular;  Laterality: N/A;  . CORONARY STENT INTERVENTION N/A 07/03/2018   Procedure: CORONARY STENT INTERVENTION;  Surgeon: Burnell Blanks, MD;  Location: [facility];  Service: Cardiovascular;  Laterality: N/A;  . CORONARY/GRAFT ACUTE MI REVASCULARIZATION N/A 03/25/2017   Procedure: Coronary/Graft Acute MI Revascularization;  Surgeon: Sherren Mocha, MD;  Location: [facility];  Service: Cardiovascular;  Laterality: N/A;  . GANGLION CYST EXCISION Left 07/25/2014   Procedure: EXCISION VOLAR AND DORSAL GANGLION LEFT WRIST;  Surgeon: Leanora Cover, MD;  Location: [facility];  Service: Orthopedics;  Laterality: Left;  . INTRAVASCULAR PRESSURE WIRE/FFR STUDY N/A 09/20/2017   Procedure: INTRAVASCULAR PRESSURE WIRE/FFR STUDY;  Surgeon: Leonie Man, MD;  Location: [facility];  Service: Cardiovascular;  Laterality: N/A;  . INTRAVASCULAR PRESSURE WIRE/FFR STUDY N/A 06/30/2018   Procedure: INTRAVASCULAR PRESSURE WIRE/FFR STUDY;  Surgeon: Nelva Bush, MD;  Location: [facility];  Service: Cardiovascular;  Laterality: N/A;  . KNEE ARTHROSCOPY     rifgr and left  . LEFT HEART CATH AND CORONARY ANGIOGRAPHY N/A 03/25/2017   Procedure: Left Heart Cath and Coronary Angiography;  Surgeon: Sherren Mocha, MD;  Location: [facility];  Service: Cardiovascular;  Laterality: N/A;  . LEFT HEART CATH AND CORONARY ANGIOGRAPHY N/A 09/20/2017   Procedure: LEFT HEART CATH AND  CORONARY ANGIOGRAPHY;  Surgeon: Leonie Man, MD;  Location: [facility];  Service: Cardiovascular;  Laterality: N/A;  . LEFT HEART CATH AND CORONARY ANGIOGRAPHY N/A 06/30/2018   Procedure: LEFT HEART CATH AND CORONARY ANGIOGRAPHY;  Surgeon: Nelva Bush, MD;  Location: [facility];  Service: Cardiovascular;  Laterality: N/A;  . SHOULDER ARTHROSCOPY  2012   left  . SHOULDER ARTHROSCOPY     right  . TEMPORARY PACEMAKER N/A 03/25/2017   Procedure: Temporary Pacemaker;  Surgeon: Sherren Mocha, MD;  Location: [facility];  Service: Cardiovascular;  Laterality: N/A;  . TONSILLECTOMY    . TOTAL KNEE ARTHROPLASTY  2002   left    Current Medications: Prior to Admission medications   Medication Sig Start Date End Date Taking? Authorizing Provider  amLODipine (NORVASC) 5 MG tablet Take 1 tablet (5 mg total) by mouth daily. 07/04/18   Cheryln Manly, NP  aspirin 81 MG EC tablet TAKE 1 TABLET BY MOUTH EVERY DAY 03/14/18   Rise Mu, PA-C  atorvastatin (LIPITOR) 80 MG tablet TAKE 1 TABLET (80 MG TOTAL) BY MOUTH DAILY AT 6 PM. 03/06/18   Sherren Mocha, MD  clopidogrel (PLAVIX) 75 MG tablet Take 1 tablet (75 mg total) by mouth daily. 02/13/18 02/08/19  Sherren Mocha, MD  hydrALAZINE (APRESOLINE) 50 MG tablet Take 1 tablet (50 mg total) by mouth every 8 (eight) hours. 07/04/18   Cheryln Manly, NP  HYDROcodone-acetaminophen (NORCO) 7.5-325 MG tablet Take 1 tablet by mouth every 6 (six) hours as needed for moderate pain.    [provider]  isosorbide mononitrate (IMDUR) 30 MG 24 hr tablet Take 0.5 tablets (15 mg total) by mouth daily. 09/16/17 09/11/18  Sherren Mocha, MD  losartan (COZAAR) 100 MG tablet Take 1 tablet (100 mg total) by mouth daily. 02/13/18 02/08/19  Sherren Mocha, MD  metFORMIN (GLUCOPHAGE) 1000 MG tablet Take 500 mg by mouth 2 (two) times daily with a meal. 08/15/17   [provider]  metoprolol tartrate (LOPRESSOR) 50 MG tablet Take  1 tablet (50 mg total) by mouth 2 (two) times daily. 12/07/17 12/02/18  Sherren Mocha, MD  nitroGLYCERIN (NITROSTAT) 0.4 MG SL tablet Place 1 tablet (0.4 mg total) under the tongue every 5 (five) minutes x 3 doses as needed for chest pain. Patient not taking: Reported on 07/11/2018 03/27/17   Rise Mu, PA-C  zolpidem (AMBIEN) 10 MG tablet Take 10 mg by mouth at bedtime as needed for sleep.     [provider]    Allergies:   Patient has no known allergies.   Social History   Socioeconomic History  . Marital status: Single    Spouse name: Not on file  . Number of children: Not on file  . Years of education: Not on file  . Highest education level: Not on file  Occupational History  . Occupation: Retired  Scientific laboratory technician  . Financial resource strain: Not on file  . Food insecurity:    Worry: Not on file    Inability: Not on file  .  Transportation needs:    Medical: Not on file    Non-medical: Not on file  Tobacco Use  . Smoking status: Former Smoker    Packs/day: 1.00    Last attempt to quit: 03/25/2017    Years since quitting: 1.3  . Smokeless tobacco: Never Used  Substance and Sexual Activity  . Alcohol use: Yes    Alcohol/week: 3.0 standard drinks    Types: 1 Glasses of wine, 1 Cans of beer, 1 Shots of liquor per week    Comment: drinks daily  beer and whiskey  . Drug use: No  . Sexual activity: Not on file  Lifestyle  . Physical activity:    Days per week: Not on file    Minutes per session: Not on file  . Stress: Not on file  Relationships  . Social connections:    Talks on phone: Not on file    Gets together: Not on file    Attends religious service: Not on file    Active member of club or organization: Not on file    Attends meetings of clubs or organizations: Not on file    Relationship status: Not on file  Other Topics Concern  . Not on file  Social History Narrative   Per wife, pt was preparing to retire soon from his job at [employer].     Family  History:  The patient's family history includes Hypertension in his brother, father, and mother.   ROS:   Please see the history of present illness.    ROS All other systems reviewed and are negative.   PHYSICAL EXAM:   VS:  BP 124/60   Pulse (!) 58   Ht 5\' 8"  (1.727 m)   Wt 191 lb 1.9 oz (86.7 kg)   SpO2 95%   BMI 29.06 kg/m    GEN: Well nourished, well developed, in no acute distress  HEENT: normal  Neck: no JVD, carotid bruits, or masses Cardiac: RRR; no murmurs, rubs, or gallops,no edema. Mild r radial hematoma Respiratory:  clear to auscultation bilaterally, normal work of breathing GI: soft, nontender, nondistended, + BS MS: no deformity or atrophy  Skin: warm and dry, no rash Neuro:  Alert and Oriented x 3, Strength and sensation are intact Psych: euthymic mood, full affect  Wt Readings from Last 3 Encounters:  07/13/18 191 lb 1.9 oz (86.7 kg)  07/04/18 189 lb 9.5 oz (86 kg)  02/22/18 181 lb 6 oz (82.3 kg)      Studies/Labs Reviewed:   EKG:  EKG is not ordered today.    Recent Labs: 07/03/2018: B Natriuretic Peptide 239.8 07/04/2018: ALT 72; BUN 8; Creatinine, Ser 1.05; Hemoglobin 12.1; Platelets 116; Potassium 3.8; Sodium 139   Lipid Panel    Component Value Date/Time   CHOL 151 07/01/2018 0334   CHOL 81 (L) 05/13/2017 0749   TRIG 180 (H) 07/01/2018 0334   HDL 34 (L) 07/01/2018 0334   HDL 24 (L) 05/13/2017 0749   CHOLHDL 4.4 07/01/2018 0334   VLDL 36 07/01/2018 0334   LDLCALC 81 07/01/2018 0334   LDLCALC 41 05/13/2017 0749    Additional studies/ records that were reviewed today include:  Cath: 06/30/18  Conclusions: 1. Significant mid LAD disease of up to 70% that appears similar to prior catheterization but is positive by hemodynamic assessment today (DFR 0.71). 2. Diffuse moderate to severe diagonal disease. 3. Patent stents in LCx/OM and lPDA. 4. Stable 50% mid RCA stenosis (non-dominant). 5. Normal left ventricular filling  pressure and  systolic function.  Recommendations: 1. Plan for orbital atherectomy and PCI to moderately to severely calcified mid LAD stenosis on Monday (07/03/18). 2. Restart heparin infusion 2 hours after TR band removal. 3. Continue dual antiplatelet with aspirin and clopidogrel. 4. Aggressive secondary prevention.  Recommend dual antiplatelet therapy with Aspirin 81mg  daily and Clopidogrel 75mg  dailylong-term (beyond 12 months) because of prior STEMI and multivessel PCI.  Nelva Bush, MD  Cath: 07/03/18   Prox LAD lesion is 70% stenosed.  A drug-eluting stent was successfully placed using a STENT SIERRA 2.50 X 15 MM.  Post intervention, there is a 0% residual stenosis.  1. Severe, heavily calcified mid LAD stenosis 2. Successful PTCA/orbital atherectomy/DES x 1 mid LAD  Recommendations:Likely discharge home tomorrow.  Recommend uninterrupted dual antiplatelet therapy with Aspirin 81mg  daily and Clopidogrel 75mg  dailyfor a minimum of 12 months (ACS - Class I recommendation).   ASSESSMENT & PLAN:    1. Cath - Recent cath as above showed Patent stents in LCx/OM and lPDA. S/p successful PTCA/orbital atherectomy/DES x 1 mid LAD. -No anginal symptoms.  Continue dual antiplatelet therapy with aspirin and Plavix.  Continue statin, Imdur and beta-blocker.  Cardiac rehab phase 2 as outpatient.  2. HTN -Stable on current medication.  3. HLD -07/01/2018: Cholesterol 151; HDL 34; LDL Cholesterol 81; Triglycerides 180; VLDL 36 -Lipitor increased to 80 mg during admission.  He was taking 40 mg prior to admission.  Repeat labs few weeks. LDL goal less than 70.  4. DM - Per PCP    Medication Adjustments/Labs and Tests Ordered: Current medicines are reviewed at length with the patient today.  Concerns regarding medicines are outlined above.  Medication changes, Labs and Tests ordered today are listed in the Patient Instructions below. Patient Instructions  Medication  Instructions:  Your physician recommends that you continue on your current medications as directed. Please refer to the Current Medication list given to you today.  If you need a refill on your cardiac medications before your next appointment, please call your pharmacy.   Lab work: LABS TO BE DONE IN 6 WEEKS: FASTING LIPIDS, LFTs If you have labs (blood work) drawn today and your tests are completely normal, you will receive your results only by: Marland Kitchen MyChart Message (if you have MyChart) OR . A paper copy in the mail If you have any lab test that is abnormal or we need to change your treatment, we will call you to review the results.  Testing/Procedures: NONE  Follow-Up: At [facility], you and your health needs are our priority.  As part of our continuing mission to provide you with exceptional heart care, we have created designated Provider Care Teams.  These Care Teams include your primary Cardiologist (physician) and Advanced Practice Providers (APPs -  Physician Assistants and Nurse Practitioners) who all work together to provide you with the care you need, when you need it. You will need a follow up appointment in:  6 months.  Please call our office 2 months in advance to schedule this appointment.  You may see Sherren Mocha, MD or one of the following Advanced Practice Providers on your designated Care Team: Richardson Dopp, PA-C [place], [place] . Daune Perch, NP  Any Other Special Instructions Will Be Listed Below (If Applicable).       Jarrett Soho, [place]  07/13/2018 9:07 AM    [address], [place], [place]  [address] Phone: [phone]; Fax: [phone]

## 2018-07-13 NOTE — Telephone Encounter (Signed)
Please review for refill.  

## 2018-07-13 NOTE — Patient Instructions (Signed)
Medication Instructions:  Your physician recommends that you continue on your current medications as directed. Please refer to the Current Medication list given to you today.  If you need a refill on your cardiac medications before your next appointment, please call your pharmacy.   Lab work: LABS TO BE DONE IN 6 WEEKS: FASTING LIPIDS, LFTs If you have labs (blood work) drawn today and your tests are completely normal, you will receive your results only by: Marland Kitchen MyChart Message (if you have MyChart) OR . A paper copy in the mail If you have any lab test that is abnormal or we need to change your treatment, we will call you to review the results.  Testing/Procedures: NONE  Follow-Up: At [facility], you and your health needs are our priority.  As part of our continuing mission to provide you with exceptional heart care, we have created designated Provider Care Teams.  These Care Teams include your primary Cardiologist (physician) and Advanced Practice Providers (APPs -  Physician Assistants and Nurse Practitioners) who all work together to provide you with the care you need, when you need it. You will need a follow up appointment in:  6 months.  Please call our office 2 months in advance to schedule this appointment.  You may see Sherren Mocha, MD or one of the following Advanced Practice Providers on your designated Care Team: Richardson Dopp, PA-C [place], [place] . Daune Perch, NP  Any Other Special Instructions Will Be Listed Below (If Applicable).

## 2018-07-18 ENCOUNTER — Ambulatory Visit (HOSPITAL_COMMUNITY)
Admission: RE | Admit: 2018-07-18 | Discharge: 2018-07-18 | Disposition: A | Discharge status: Home / Self Care | Payer: 59 | Source: Ambulatory Visit | Attending: Internal Medicine | Admitting: Internal Medicine

## 2018-07-18 ENCOUNTER — Encounter: Payer: 59 | Admitting: *Deleted

## 2018-07-18 VITALS — BP 127/66 | HR 68

## 2018-07-18 DIAGNOSIS — Z006 Encounter for examination for normal comparison and control in clinical research program: Secondary | ICD-10-CM

## 2018-07-18 MED ORDER — STUDY - AEGIS II STUDY - PLACEBO OR CSL112 (PI-HILTY)
170.0000 mL | Freq: Once | INTRAVENOUS | Status: AC
  Administered 2018-07-18: 170 mL via INTRAVENOUS
  Filled 2018-07-18: qty 170

## 2018-07-18 NOTE — Research (Signed)
AEGIS Visit 4 infusion 3  Patient states no changes in medication or adverse events since last research visit. Reviewed upcoming research required appointments, patient verbalized understanding.                                     "CONSENT"   YES     NO   Continuing further Investigational Product and study visits for follow-up? [x]  []   Continuing consent from future biomedical research [x]  []                                      "EVENTS"    YES     NO  AE   (IF YES SEE SOURCE) []  [x]   SAE  (IF YES SEE SOURCE) []  [x]   ENDPOINT   (IF YES SEE SOURCE) []  [x]   REVASCULARIZATION  (IF YES SEE SOURCE) []  [x]   AMPUTATION   (IF YES SEE SOURCE) []  [x]   TROPONIN'S  (IF YES SEE SOURCE) []  [x] 

## 2018-07-24 NOTE — Research (Signed)
Aegis Visit 2 PK

## 2018-07-25 ENCOUNTER — Ambulatory Visit (HOSPITAL_COMMUNITY)
Admission: RE | Admit: 2018-07-25 | Discharge: 2018-07-25 | Disposition: A | Discharge status: Home / Self Care | Payer: 59 | Source: Ambulatory Visit | Attending: Internal Medicine | Admitting: Internal Medicine

## 2018-07-25 ENCOUNTER — Encounter: Payer: 59 | Admitting: *Deleted

## 2018-07-25 VITALS — BP 103/67 | HR 56

## 2018-07-25 DIAGNOSIS — Z006 Encounter for examination for normal comparison and control in clinical research program: Secondary | ICD-10-CM

## 2018-07-25 MED ORDER — STUDY - AEGIS II STUDY - PLACEBO OR CSL112 (PI-HILTY)
170.0000 mL | Freq: Once | INTRAVENOUS | Status: AC
  Administered 2018-07-25: 170 mL via INTRAVENOUS
  Filled 2018-07-25: qty 170

## 2018-07-25 NOTE — Research (Signed)
[facility]egis Visit 5 infusion 4  Pt here today for last infusion. He has done well, with no complaints of cp or sob. No med changes noted per pt. PK was only central lab drawn today.                                     "CONSENT"   YES     NO   Continuing further Investigational Product and study visits for follow-up? [x]  []   Continuing consent from future biomedical research [x]  []                                   "EVENTS"    YES     NO  AE   (IF YES SEE SOURCE) []  [x]   SAE  (IF YES SEE SOURCE) []  [x]   ENDPOINT   (IF YES SEE SOURCE) []  [x]   REVASCULARIZATION  (IF YES SEE SOURCE) []  [x]   AMPUTATION   (IF YES SEE SOURCE) []  [x]   TROPONIN'S  (IF YES SEE SOURCE) []  [x]     Current Outpatient Medications:  .  amLODipine (NORVASC) 5 MG tablet, Take 1 tablet (5 mg total) by mouth daily., Disp: 30 tablet, Rfl: 1 .  atorvastatin (LIPITOR) 80 MG tablet, TAKE 1 TABLET (80 MG TOTAL) BY MOUTH DAILY AT 6 PM., Disp: 30 tablet, Rfl: 11 .  clopidogrel (PLAVIX) 75 MG tablet, Take 1 tablet (75 mg total) by mouth daily., Disp: 30 tablet, Rfl: 11 .  [facility] ASPIRIN LOW STRENGTH 81 MG EC tablet, TAKE 1 TABLET BY MOUTH EVERY DAY, Disp: 90 tablet, Rfl: 3 .  hydrALAZINE (APRESOLINE) 50 MG tablet, Take 1 tablet (50 mg total) by mouth every 8 (eight) hours., Disp: 90 tablet, Rfl: 1 .  HYDROcodone-acetaminophen (NORCO) 7.5-325 MG tablet, Take 1 tablet by mouth every 6 (six) hours as needed for moderate pain., Disp: , Rfl:  .  isosorbide mononitrate (IMDUR) 30 MG 24 hr tablet, Take 0.5 tablets (15 mg total) by mouth daily., Disp: 15 tablet, Rfl: 11 .  losartan (COZAAR) 100 MG tablet, Take 1 tablet (100 mg total) by mouth daily., Disp: 30 tablet, Rfl: 11 .  metFORMIN (GLUCOPHAGE) 1000 MG tablet, Take 500 mg by mouth 2 (two) times daily with a meal., Disp: , Rfl: 6 .  metoprolol tartrate (LOPRESSOR) 50 MG tablet, Take 1 tablet (50 mg total) by mouth 2 (two) times daily., Disp: 180 tablet, Rfl: 3 .  nitroGLYCERIN (NITROSTAT) 0.4  MG SL tablet, Place 1 tablet (0.4 mg total) under the tongue every 5 (five) minutes x 3 doses as needed for chest pain., Disp: 25 tablet, Rfl: 11 .  zolpidem (AMBIEN) 10 MG tablet, Take 10 mg by mouth at bedtime as needed for sleep. , Disp: , Rfl:

## 2018-07-26 ENCOUNTER — Other Ambulatory Visit: Payer: Self-pay | Admitting: Cardiology

## 2018-08-01 ENCOUNTER — Encounter: Payer: 59 | Admitting: *Deleted

## 2018-08-01 VITALS — BP 153/74 | HR 64 | Wt 190.0 lb

## 2018-08-01 DIAGNOSIS — Z006 Encounter for examination for normal comparison and control in clinical research program: Secondary | ICD-10-CM

## 2018-08-01 NOTE — Research (Signed)
AEGIS visit 6   Pt doing well, no complaints of cp. Some sob but when he re-positions it gets better.  No med changes.                                      "CONSENT"   YES     NO   Continuing further Investigational Product and study visits for follow-up? [x]  []   Continuing consent from future biomedical research [x]  []                                    "EVENTS"    YES     NO  AE   (IF YES SEE SOURCE) []  [x]   SAE  (IF YES SEE SOURCE) []  [x]   ENDPOINT   (IF YES SEE SOURCE) []  [x]   REVASCULARIZATION  (IF YES SEE SOURCE) []  [x]   AMPUTATION   (IF YES SEE SOURCE) []  [x]   TROPONIN'S  (IF YES SEE SOURCE) []  [x]            Current Outpatient Medications:  .  amLODipine (NORVASC) 5 MG tablet, TAKE 1 TABLET BY MOUTH EVERY DAY, Disp: 30 tablet, Rfl: 10 .  atorvastatin (LIPITOR) 80 MG tablet, TAKE 1 TABLET (80 MG TOTAL) BY MOUTH DAILY AT 6 PM., Disp: 30 tablet, Rfl: 11 .  clopidogrel (PLAVIX) 75 MG tablet, Take 1 tablet (75 mg total) by mouth daily., Disp: 30 tablet, Rfl: 11 .  [facility] ASPIRIN LOW STRENGTH 81 MG EC tablet, TAKE 1 TABLET BY MOUTH EVERY DAY, Disp: 90 tablet, Rfl: 3 .  hydrALAZINE (APRESOLINE) 50 MG tablet, TAKE 1 TABLET (50 MG TOTAL) BY MOUTH EVERY 8 (EIGHT) HOURS., Disp: 270 tablet, Rfl: 3 .  HYDROcodone-acetaminophen (NORCO) 7.5-325 MG tablet, Take 1 tablet by mouth every 6 (six) hours as needed for moderate pain., Disp: , Rfl:  .  isosorbide mononitrate (IMDUR) 30 MG 24 hr tablet, Take 0.5 tablets (15 mg total) by mouth daily., Disp: 15 tablet, Rfl: 11 .  losartan (COZAAR) 100 MG tablet, Take 1 tablet (100 mg total) by mouth daily., Disp: 30 tablet, Rfl: 11 .  metFORMIN (GLUCOPHAGE) 1000 MG tablet, Take 500 mg by mouth 2 (two) times daily with a meal., Disp: , Rfl: 6 .  metoprolol tartrate (LOPRESSOR) 50 MG tablet, Take 1 tablet (50 mg total) by mouth 2 (two) times daily., Disp: 180 tablet, Rfl: 3 .  nitroGLYCERIN (NITROSTAT) 0.4 MG SL tablet, Place 1 tablet (0.4 mg total)  under the tongue every 5 (five) minutes x 3 doses as needed for chest pain., Disp: 25 tablet, Rfl: 11 .  zolpidem (AMBIEN) 10 MG tablet, Take 10 mg by mouth at bedtime as needed for sleep. , Disp: , Rfl:

## 2018-08-09 NOTE — Research (Signed)
Following labs were entered in error on wrong date.

## 2018-08-24 ENCOUNTER — Other Ambulatory Visit: Payer: Self-pay | Admitting: *Deleted

## 2018-08-24 DIAGNOSIS — I25110 Atherosclerotic heart disease of native coronary artery with unstable angina pectoris: Secondary | ICD-10-CM

## 2018-08-24 DIAGNOSIS — E785 Hyperlipidemia, unspecified: Secondary | ICD-10-CM

## 2018-08-24 DIAGNOSIS — I10 Essential (primary) hypertension: Secondary | ICD-10-CM

## 2018-08-24 LAB — LIPID PANEL
Chol/HDL Ratio: 2.5 ratio (ref 0.0–5.0)
Cholesterol, Total: 111 mg/dL (ref 100–199)
HDL: 45 mg/dL (ref >39)
LDL CALC: 51 mg/dL (ref 0–99)
Triglycerides: 73 mg/dL (ref 0–149)
VLDL CHOLESTEROL CAL: 15 mg/dL (ref 5–40)

## 2018-08-24 LAB — HEPATIC FUNCTION PANEL
ALT: 35 IU/L (ref 0–44)
AST: 31 IU/L (ref 0–40)
Albumin: 4.4 g/dL (ref 3.6–4.8)
Alkaline Phosphatase: 52 IU/L (ref 39–117)
BILIRUBIN, DIRECT: 0.12 mg/dL (ref 0.00–0.40)
Bilirubin Total: 0.3 mg/dL (ref 0.0–1.2)
Total Protein: 6.2 g/dL (ref 6.0–8.5)

## 2018-08-28 ENCOUNTER — Encounter (HOSPITAL_COMMUNITY)
Admission: RE | Admit: 2018-08-28 | Discharge: 2018-08-28 | Disposition: A | Discharge status: Home / Self Care | Payer: 59 | Source: Ambulatory Visit | Attending: Cardiovascular Disease | Admitting: Cardiovascular Disease

## 2018-08-28 ENCOUNTER — Telehealth: Payer: Self-pay | Admitting: *Deleted

## 2018-08-28 ENCOUNTER — Encounter (HOSPITAL_COMMUNITY): Payer: Self-pay

## 2018-08-28 VITALS — BP 120/64 | HR 59 | Ht 69.0 in | Wt 195.6 lb

## 2018-08-28 DIAGNOSIS — Z955 Presence of coronary angioplasty implant and graft: Secondary | ICD-10-CM | POA: Diagnosis present

## 2018-08-28 DIAGNOSIS — I214 Non-ST elevation (NSTEMI) myocardial infarction: Secondary | ICD-10-CM

## 2018-08-28 DIAGNOSIS — Z79899 Other long term (current) drug therapy: Secondary | ICD-10-CM

## 2018-08-28 MED ORDER — ATORVASTATIN CALCIUM 40 MG PO TABS: 40.0000 mg | ORAL_TABLET | Freq: Every day | ORAL | 3 refills | Status: DC

## 2018-08-28 NOTE — Progress Notes (Signed)
Daily Session Note  Patient Details  Name: [name] Foster MRN: [id number] Date of Birth: 12-Jun-1956 Referring Provider:     CARDIAC REHAB PHASE II ORIENTATION from 08/28/2018 in [place]  Referring Provider  [place]       Encounter Date: 08/28/2018  Check In: Session Check In - 08/28/18 0800      Check-In   Supervising physician immediately available to respond to emergencies  See telemetry face sheet for immediately available MD    Location  [facility]    Staff Present  Benay Pike, Exercise Physiologist;Debra Wynetta Emery, RN, BSN;Jeda Pardue, MS, EP, [facility], Exercise Physiologist    Medication changes reported      No    Fall or balance concerns reported     No    Tobacco Cessation  --   Quit 03/25/17   Warm-up and Cool-down  Performed as group-led instruction    Resistance Training Performed  Yes    VAD Patient?  No    PAD/SET Patient?  No      Pain Assessment   Currently in Pain?  No/denies    Multiple Pain Sites  No       Capillary Blood Glucose: No results found for this or any previous visit (from the past 24 hour(s)).  Exercise Prescription Changes - 08/28/18 1000      Response to Exercise   Blood Pressure (Admit)  120/64    Blood Pressure (Exercise)  138/64    Blood Pressure (Exit)  126/64    Heart Rate (Admit)  59 bpm    Heart Rate (Exercise)  76 bpm    Heart Rate (Exit)  63 bpm    Oxygen Saturation (Admit)  95 %    Oxygen Saturation (Exercise)  97 %    Oxygen Saturation (Exit)  96 %    Rating of Perceived Exertion (Exercise)  11    Perceived Dyspnea (Exercise)  8    Comments  6 minute walk test    Duration  Progress to 30 minutes of  aerobic without signs/symptoms of physical distress    Intensity  THRR New   [phone]      Social History   Tobacco Use  Smoking Status Former Smoker  . Packs/day: 2.00  . Years: 30.00  . Pack years: 60.00  . Last attempt to quit: 03/25/2017  . Years since quitting: 1.4   Smokeless Tobacco Never Used    Goals Met:  Independence with exercise equipment Exercise tolerated well Personal goals reviewed No report of cardiac concerns or symptoms Strength training completed today  Goals Unmet:  Not Applicable  Comments: Check out: 11:00   Dr. Kate Sable is Medical Director for [facility] and Pulmonary Re[facility].

## 2018-08-28 NOTE — Progress Notes (Signed)
Cardiac Individual Treatment Plan  Patient Details  Name: [name] Foster MRN: [id number] Date of Birth: Mar 19, 1956 Referring Provider:     CARDIAC REHAB PHASE II ORIENTATION from 08/28/2018 in [place]  Referring Provider  Branch       Initial Encounter Date:    CARDIAC REHAB PHASE II ORIENTATION from 08/28/2018 in [place]  Date  08/28/18      Visit Diagnosis: NSTEMI (non-ST elevated myocardial infarction) [facility])  Status post coronary artery stent placement  Patient's Home Medications on Admission:  Current Outpatient Medications:  .  amLODipine (NORVASC) 5 MG tablet, TAKE 1 TABLET BY MOUTH EVERY DAY, Disp: 30 tablet, Rfl: 10 .  atorvastatin (LIPITOR) 80 MG tablet, TAKE 1 TABLET (80 MG TOTAL) BY MOUTH DAILY AT 6 PM., Disp: 30 tablet, Rfl: 11 .  clopidogrel (PLAVIX) 75 MG tablet, Take 1 tablet (75 mg total) by mouth daily., Disp: 30 tablet, Rfl: 11 .  [employer] ASPIRIN LOW STRENGTH 81 MG EC tablet, TAKE 1 TABLET BY MOUTH EVERY DAY, Disp: 90 tablet, Rfl: 3 .  hydrALAZINE (APRESOLINE) 50 MG tablet, TAKE 1 TABLET (50 MG TOTAL) BY MOUTH EVERY 8 (EIGHT) HOURS., Disp: 270 tablet, Rfl: 3 .  HYDROcodone-acetaminophen (NORCO) 7.5-325 MG tablet, Take 1 tablet by mouth every 6 (six) hours as needed for moderate pain., Disp: , Rfl:  .  isosorbide mononitrate (IMDUR) 30 MG 24 hr tablet, Take 0.5 tablets (15 mg total) by mouth daily., Disp: 15 tablet, Rfl: 11 .  losartan (COZAAR) 100 MG tablet, Take 1 tablet (100 mg total) by mouth daily., Disp: 30 tablet, Rfl: 11 .  metFORMIN (GLUCOPHAGE) 1000 MG tablet, Take 500 mg by mouth 2 (two) times daily with a meal., Disp: , Rfl: 6 .  metoprolol tartrate (LOPRESSOR) 50 MG tablet, Take 1 tablet (50 mg total) by mouth 2 (two) times daily., Disp: 180 tablet, Rfl: 3 .  nitroGLYCERIN (NITROSTAT) 0.4 MG SL tablet, Place 1 tablet (0.4 mg total) under the tongue every 5 (five) minutes x 3 doses as needed for chest  pain., Disp: 25 tablet, Rfl: 11 .  zolpidem (AMBIEN) 10 MG tablet, Take 10 mg by mouth at bedtime as needed for sleep. , Disp: , Rfl:   Past Medical History: Past Medical History:  Diagnosis Date  . CAD in native artery    a. 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 DES to left PDA 09/20/17  . Diabetes mellitus without complication ([place])   . High cholesterol   . Hyperlipidemia   . Hypertension   . Insomnia   . Pulmonary hypertension ([place])    a. TTE 03/25/17; EF 60-65%, nl WM, LV diastolic fxn nl, trivial AI, RV cavity size, wall thickness, and sys fxn nl, trivial TR, PASP 39 mmHg, trivial pericardial effusion  . Sleep apnea    could not use a cpap  . Wears dentures    full top-partial bottom  . Wears glasses     Tobacco Use: Social History   Tobacco Use  Smoking Status Former Smoker  . Packs/day: 2.00  . Years: 30.00  . Pack years: 60.00  . Last attempt to quit: 03/25/2017  . Years since quitting: 1.4  Smokeless Tobacco Never Used    Labs: Recent Review [facility] manager for [facility] Cardiac and Pulmonary Rehab Latest Ref Rng &  Units 03/25/2017 05/13/2017 07/01/2018 08/24/2018   Cholestrol 100 - 199 mg/dL 163 81(L) 151 111   LDLCALC 0 - 99 mg/dL 92 41 81 51   HDL >39 mg/dL 25(L) 24(L) 34(L) 45   Trlycerides 0 - 149 mg/dL 229(H) 82 180(H) 73   Hemoglobin A1c 4.8 - 5.6 % 9.4(H) - - -   PHART 7.350 - 7.450 7.206(L) - - -   PCO2ART 32.0 - 48.0 mmHg 53.8(H) - - -   HCO3 20.0 - 28.0 mmol/L 21.3 - - -   TCO2 0 - 100 mmol/L 23 - - -   ACIDBASEDEF 0.0 - 2.0 mmol/L 7.0(H) - - -   O2SAT % 99.0 - - -      Capillary Blood Glucose: Lab Results  Component Value Date   GLUCAP 120 (H) 07/04/2018   GLUCAP 113 (H) 07/04/2018   GLUCAP 170 (H) 07/03/2018   GLUCAP 98 07/03/2018   GLUCAP 102 (H) 07/03/2018     Exercise Target Goals: Exercise Program  Goal: Individual exercise prescription set using results from initial 6 min walk test and THRR while considering  patient's activity barriers and safety.   Exercise Prescription Goal: Starting with aerobic activity 30 plus minutes a day, 3 days per week for initial exercise prescription. Provide home exercise prescription and guidelines that participant acknowledges understanding prior to discharge.  Activity Barriers & Risk Stratification: Activity Barriers & Cardiac Risk Stratification - 08/28/18 1021      Activity Barriers & Cardiac Risk Stratification   Activity Barriers  None    Cardiac Risk Stratification  High       6 Minute Walk: 6 Minute Walk    Row Name 08/28/18 1020         6 Minute Walk   Phase  Initial     Distance  1600 feet     Walk Time  6 minutes     # of Rest Breaks  0     MPH  3.03     METS  3.32     RPE  11     Perceived Dyspnea   8     VO2 Peak  12.47     Symptoms  No     Resting HR  59 bpm     Resting BP  120/64     Resting Oxygen Saturation   95 %     Exercise Oxygen Saturation  during 6 min walk  97 %     Max Ex. HR  76 bpm     Max Ex. BP  138/64     2 Minute Post BP  126/64        Oxygen Initial Assessment:   Oxygen Re-Evaluation:   Oxygen Discharge (Final Oxygen Re-Evaluation):   Initial Exercise Prescription: Initial Exercise Prescription - 08/28/18 1000      Date of Initial Exercise RX and Referring Provider   Date  08/28/18    Referring Provider  Branch     Expected Discharge Date  11/27/18      Treadmill   MPH  1.6    Grade  0    Minutes  17    METs  2.22      Recumbant Elliptical   Level  1    RPM  51    Watts  55    Minutes  22    METs  3.6      Prescription Details   Frequency (times per week)  3  Duration  Progress to 30 minutes of continuous aerobic without signs/symptoms of physical distress      Intensity   THRR 40-80% of Max Heartrate  [phone]    Ratings of Perceived Exertion  11-13    Perceived  Dyspnea  0-4      Progression   Progression  Continue progressive overload as per policy without signs/symptoms or physical distress.      Resistance Training   Training Prescription  Yes    Weight  3    Reps  10-15       Perform Capillary Blood Glucose checks as needed.  Exercise Prescription Changes:  Exercise Prescription Changes    Row Name 08/28/18 1000             Response to Exercise   Blood Pressure (Admit)  120/64       Blood Pressure (Exercise)  138/64       Blood Pressure (Exit)  126/64       Heart Rate (Admit)  59 bpm       Heart Rate (Exercise)  76 bpm       Heart Rate (Exit)  63 bpm       Oxygen Saturation (Admit)  95 %       Oxygen Saturation (Exercise)  97 %       Oxygen Saturation (Exit)  96 %       Rating of Perceived Exertion (Exercise)  11       Perceived Dyspnea (Exercise)  8       Comments  6 minute walk test       Duration  Progress to 30 minutes of  aerobic without signs/symptoms of physical distress       Intensity  THRR New [phone]          Exercise Comments:   Exercise Goals and Review:  Exercise Goals    Row Name 08/28/18 1023             Exercise Goals   Increase Physical Activity  Yes       Intervention  Provide advice, education, support and counseling about physical activity/exercise needs.;Develop an individualized exercise prescription for aerobic and resistive training based on initial evaluation findings, risk stratification, comorbidities and participant's personal goals.       Expected Outcomes  Short Term: Attend rehab on a regular basis to increase amount of physical activity.       Increase Strength and Stamina  Yes       Intervention  Provide advice, education, support and counseling about physical activity/exercise needs.;Develop an individualized exercise prescription for aerobic and resistive training based on initial evaluation findings, risk stratification, comorbidities and participant's personal goals.        Expected Outcomes  Short Term: Increase workloads from initial exercise prescription for resistance, speed, and METs.;Long Term: Improve cardiorespiratory fitness, muscular endurance and strength as measured by increased METs and functional capacity (6MWT);Short Term: Perform resistance training exercises routinely during rehab and add in resistance training at home       Able to understand and use rate of perceived exertion (RPE) scale  Yes       Intervention  Provide education and explanation on how to use RPE scale       Expected Outcomes  Short Term: Able to use RPE daily in rehab to express subjective intensity level;Long Term:  Able to use RPE to guide intensity level when exercising independently       Able to  understand and use Dyspnea scale  Yes       Intervention  Provide education and explanation on how to use Dyspnea scale       Expected Outcomes  Short Term: Able to use Dyspnea scale daily in rehab to express subjective sense of shortness of breath during exertion;Long Term: Able to use Dyspnea scale to guide intensity level when exercising independently       Knowledge and understanding of Target Heart Rate Range (THRR)  Yes       Intervention  Provide education and explanation of THRR including how the numbers were predicted and where they are located for reference       Expected Outcomes  Short Term: Able to state/look up THRR       Able to check pulse independently  Yes       Intervention  Provide education and demonstration on how to check pulse in carotid and radial arteries.;Review the importance of being able to check your own pulse for safety during independent exercise       Expected Outcomes  Short Term: Able to explain why pulse checking is important during independent exercise;Long Term: Able to check pulse independently and accurately       Understanding of Exercise Prescription  Yes       Intervention  Provide education, explanation, and written materials on patient's  individual exercise prescription       Expected Outcomes  Short Term: Able to explain program exercise prescription;Long Term: Able to explain home exercise prescription to exercise independently          Exercise Goals Re-Evaluation :    Discharge Exercise Prescription (Final Exercise Prescription Changes): Exercise Prescription Changes - 08/28/18 1000      Response to Exercise   Blood Pressure (Admit)  120/64    Blood Pressure (Exercise)  138/64    Blood Pressure (Exit)  126/64    Heart Rate (Admit)  59 bpm    Heart Rate (Exercise)  76 bpm    Heart Rate (Exit)  63 bpm    Oxygen Saturation (Admit)  95 %    Oxygen Saturation (Exercise)  97 %    Oxygen Saturation (Exit)  96 %    Rating of Perceived Exertion (Exercise)  11    Perceived Dyspnea (Exercise)  8    Comments  6 minute walk test    Duration  Progress to 30 minutes of  aerobic without signs/symptoms of physical distress    Intensity  THRR New   [phone]      Nutrition:  Target Goals: Understanding of nutrition guidelines, daily intake of sodium 1500mg , cholesterol 200mg , calories 30% from fat and 7% or less from saturated fats, daily to have 5 or more servings of fruits and vegetables.  Biometrics: Pre Biometrics - 08/28/18 1024      Pre Biometrics   Height  5\' 9"  (1.753 m)    Waist Circumference  40.5 inches    Hip Circumference  36 inches    Waist to Hip Ratio  1.12 %    Triceps Skinfold  6 mm    % Body Fat  24.3 %    Grip Strength  13.2 kg    Flexibility  14.5 in    Single Leg Stand  37.89 seconds        Nutrition Therapy Plan and Nutrition Goals: Nutrition Therapy & Goals - 08/28/18 1137      Personal Nutrition Goals   Personal Goal #2  Patient  is currently eating a heart health, low sodium and low fat diet.     Additional Goals?  No       Nutrition Assessments: Nutrition Assessments - 08/28/18 1137      MEDFICTS Scores   Pre Score  18       Nutrition Goals  Re-Evaluation:   Nutrition Goals Discharge (Final Nutrition Goals Re-Evaluation):   Psychosocial: Target Goals: Acknowledge presence or absence of significant depression and/or stress, maximize coping skills, provide positive support system. Participant is able to verbalize types and ability to use techniques and skills needed for reducing stress and depression.  Initial Review & Psychosocial Screening: Initial Psych Review & Screening - 08/28/18 1136      Initial Review   Current issues with  None Identified      Family Dynamics   Good Support System?  Yes      Barriers   Psychosocial barriers to participate in program  There are no identifiable barriers or psychosocial needs.      Screening Interventions   Interventions  Encouraged to exercise    Expected Outcomes  Short Term goal: Identification and review with participant of any Quality of Life or Depression concerns found by scoring the questionnaire.;Long Term goal: The participant improves quality of Life and PHQ9 Scores as seen by post scores and/or verbalization of changes       Quality of Life Scores: Quality of Life - 08/28/18 1025      Quality of Life   Select  Quality of Life      Quality of Life Scores   Health/Function Pre  19.63 %    Socioeconomic Pre  23.43 %    Psych/Spiritual Pre  21.79 %    Family Pre  27.6 %    GLOBAL Pre  22.03 %      Scores of 19 and below usually indicate a poorer quality of life in these areas.  A difference of  2-3 points is a clinically meaningful difference.  A difference of 2-3 points in the total score of the Quality of Life Index has been associated with significant improvement in overall quality of life, self-image, physical symptoms, and general health in studies assessing change in quality of life.  PHQ-9: Recent Review Flowsheet Data    Depression screen [facility] 2/9 08/28/2018 02/27/2018 10/25/2017 04/26/2017   Decreased Interest 0 0 0 0   Down, Depressed, Hopeless 0 0 0 0    PHQ - 2 Score 0 0 0 0   Altered sleeping 1 1 1  -   Tired, decreased energy 3 1 1  -   Change in appetite 1 1 0 -   Feeling bad or failure about yourself  0 0 0 -   Trouble concentrating 0 0 0 -   Moving slowly or fidgety/restless 0 0 0 -   Suicidal thoughts 0 0 0 -   PHQ-9 Score 5 3 2  -   Difficult doing work/chores Not difficult at all Not difficult at all Not difficult at all -     Interpretation of Total Score  Total Score Depression Severity:  1-4 = Minimal depression, 5-9 = Mild depression, 10-14 = Moderate depression, 15-19 = Moderately severe depression, 20-27 = Severe depression   Psychosocial Evaluation and Intervention: Psychosocial Evaluation - 08/28/18 1136      Psychosocial Evaluation & Interventions   Interventions  Encouraged to exercise with the program and follow exercise prescription    Continue Psychosocial Services   No Follow up  required       Psychosocial Re-Evaluation:   Psychosocial Discharge (Final Psychosocial Re-Evaluation):   Vocational Rehabilitation: Provide vocational rehab assistance to qualifying candidates.   Vocational Rehab Evaluation & Intervention: Vocational Rehab - 08/28/18 1138      Initial Vocational Rehab Evaluation & Intervention   Assessment shows need for Vocational Rehabilitation  No       Education: Education Goals: Education classes will be provided on a weekly basis, covering required topics. Participant will state understanding/return demonstration of topics presented.  Learning Barriers/Preferences: Learning Barriers/Preferences - 08/28/18 1138      Learning Barriers/Preferences   Learning Barriers  None    Learning Preferences  Skilled Demonstration;Group Instruction;Individual Instruction       Education Topics: Hypertension, Hypertension Reduction -Define heart disease and high blood pressure. Discus how high blood pressure affects the body and ways to reduce high blood pressure.   Exercise and Your  Heart -Discuss why it is important to exercise, the FITT principles of exercise, normal and abnormal responses to exercise, and how to exercise safely.   CARDIAC REHAB PHASE II EXERCISE from 02/15/2018 in [place]  Date  01/25/18  Educator  DC  Instruction Review Code  2- Demonstrated Understanding      Angina -Discuss definition of angina, causes of angina, treatment of angina, and how to decrease risk of having angina.   CARDIAC REHAB PHASE II EXERCISE from 02/15/2018 in [place]  Date  11/02/17  Educator  DJ  Instruction Review Code  2- Demonstrated Understanding      Cardiac Medications -Review what the following cardiac medications are used for, how they affect the body, and side effects that may occur when taking the medications.  Medications include Aspirin, Beta blockers, calcium channel blockers, ACE Inhibitors, angiotensin receptor blockers, diuretics, digoxin, and antihyperlipidemics.   CARDIAC REHAB PHASE II EXERCISE from 02/15/2018 in [place]  Date  11/09/17  Educator  DC  Instruction Review Code  2- Demonstrated Understanding      Congestive Heart Failure -Discuss the definition of CHF, how to live with CHF, the signs and symptoms of CHF, and how keep track of weight and sodium intake.   CARDIAC REHAB PHASE II EXERCISE from 02/15/2018 in [place]  Date  11/16/17  Educator  DC  Instruction Review Code  2- Demonstrated Understanding      Heart Disease and Intimacy -Discus the effect sexual activity has on the heart, how changes occur during intimacy as we age, and safety during sexual activity.   Smoking Cessation / COPD -Discuss different methods to quit smoking, the health benefits of quitting smoking, and the definition of COPD.   CARDIAC REHAB PHASE II EXERCISE from 02/15/2018 in [place]  Date  12/01/17  Educator  DC  Instruction Review Code   2- Demonstrated Understanding      Nutrition I: Fats -Discuss the types of cholesterol, what cholesterol does to the heart, and how cholesterol levels can be controlled.   CARDIAC REHAB PHASE II EXERCISE from 02/15/2018 in [place]  Date  12/07/17  Educator  DC  Instruction Review Code  2- Demonstrated Understanding      Nutrition II: Labels -Discuss the different components of food labels and how to read food label   CARDIAC REHAB PHASE II EXERCISE from 02/15/2018 in [place]  Date  12/14/17  Educator  DJ  Instruction Review Code  2- Demonstrated  Understanding      Heart Parts/Heart Disease and PAD -Discuss the anatomy of the heart, the pathway of blood circulation through the heart, and these are affected by heart disease.   CARDIAC REHAB PHASE II EXERCISE from 02/15/2018 in [place]  Date  12/21/17  Educator  DJ  Instruction Review Code  2- Demonstrated Understanding      Stress I: Signs and Symptoms -Discuss the causes of stress, how stress may lead to anxiety and depression, and ways to limit stress.   Stress II: Relaxation -Discuss different types of relaxation techniques to limit stress.   Warning Signs of Stroke / TIA -Discuss definition of a stroke, what the signs and symptoms are of a stroke, and how to identify when someone is having stroke.   Knowledge Questionnaire Score: Knowledge Questionnaire Score - 08/28/18 1138      Knowledge Questionnaire Score   Pre Score  23/24       Core Components/Risk Factors/Patient Goals at Admission: Personal Goals and Risk Factors at Admission - 08/28/18 1138      Core Components/Risk Factors/Patient Goals on Admission    Weight Management  Yes    Admit Weight  195 lb 9.6 oz (88.7 kg)    Goal Weight: Short Term  185 lb 9.6 oz (84.2 kg)    Goal Weight: Long Term  175 lb 9.6 oz (79.7 kg)    Expected Outcomes  Short Term: Continue to assess and  modify interventions until short term weight is achieved;Long Term: Adherence to nutrition and physical activity/exercise program aimed toward attainment of established weight goal    Personal Goal Other  Yes    Personal Goal  Lose 25lbs, regain heat health    Intervention  Attend CR 3 x week and suopplement with at home exercise 2 x week.    Expected Outcomes  Reach personal goals       Core Components/Risk Factors/Patient Goals Review:    Core Components/Risk Factors/Patient Goals at Discharge (Final Review):    ITP Comments: ITP Comments    Row Name 08/28/18 1115           ITP Comments  Mr. Wiedeman finished our program, joined maintenance. He has recently had another episode and is now coming back through the program. He is eager to get started.           Comments: Patient arrived for 1st visit/orientation/education at 0800. Patient was referred to CR by Dr. Harl Bowie due to NSTEMI (I21.4) and Stent Placement (Z95.5). During orientation advised patient on arrival and appointment times what to wear, what to do before, during and after exercise. Reviewed attendance and class policy. Talked about inclement weather and class consultation policy. Pt is scheduled to return Cardiac Rehab on 08/30/18 at 0930. Pt was advised to come to class 15 minutes before class starts. Patient was also given instructions on meeting with the dietician and attending the Family Structure classes. Discussed RPE/Dpysnea scales. Discussed initial THR and how to find their radial and/or carotid pulse. Discussed the initial exercise prescription and how this effects their progress. Pt is eager to get started. Patient participated in warm up stretches followed by light weights and resistance bands. Patient was able to complete 6 minute walk test. Patient did not c/o pain. He said he had leg fatigue towards end of test. Leg fatigue subsided to none at 2 minute rest. Fatigue was completely gone at the end of orientation.  Patient was measured for the equipment. Discussed  equipment safety with patient. Took patient pre-anthropometric measurements. Patient finished visit at 11:00.

## 2018-08-28 NOTE — Progress Notes (Signed)
Cardiac/Pulmonary Rehab Medication Review by a Pharmacist  Does the patient  feel that his/her medications are working for him/her?  yes  Has the patient been experiencing any side effects to the medications prescribed?  Yes- GI side effects with metformin and shortness of breath sometimes- thinks it could be plavix since had worse reaction to brilinta.  Does the patient measure his/her own blood pressure or blood glucose at home?  yes   Does the patient have any problems obtaining medications due to transportation or finances?   no  Understanding of regimen: good Understanding of indications: good Potential of compliance: excellent  Questions asked to Determine Patient Understanding of Medication Regimen:  1. What is the name of the medication?  2. What is the medication used for?  3. When should it be taken?  4. How much should be taken?  5. How will you take it?  6. What side effects should you report?  Understanding Defined as: Excellent: All questions above are correct Good: Questions 1-4 are correct Fair: Questions 1-2 are correct  Poor: 1 or none of the above questions are correct   Pharmacist comments: Overall, patient has good compliance and understanding of medications.  He states some side effects to metformin that is tolerable but I recommended trying metformin ER for GI side effects since he already takes with food.    [name] Foster 08/28/2018 8:31 AM

## 2018-08-28 NOTE — Telephone Encounter (Signed)
-----   Message from [place], [place] sent at 08/28/2018 12:31 PM EST ----- LDL at goal but LFTs increased. Reduce lipitor to prior dose of 40mg  daily. Repeat LFTS in 4 weeks. Forwards labs to PCP.

## 2018-08-30 ENCOUNTER — Encounter (HOSPITAL_COMMUNITY): Payer: 59

## 2018-08-30 ENCOUNTER — Encounter (HOSPITAL_COMMUNITY)
Admission: RE | Admit: 2018-08-30 | Discharge: 2018-08-30 | Disposition: A | Discharge status: Home / Self Care | Payer: 59 | Source: Ambulatory Visit | Attending: Cardiovascular Disease | Admitting: Cardiovascular Disease

## 2018-08-30 DIAGNOSIS — I214 Non-ST elevation (NSTEMI) myocardial infarction: Secondary | ICD-10-CM | POA: Diagnosis not present

## 2018-09-01 ENCOUNTER — Encounter (HOSPITAL_COMMUNITY)
Admission: RE | Admit: 2018-09-01 | Discharge: 2018-09-01 | Disposition: A | Discharge status: Home / Self Care | Payer: 59 | Source: Ambulatory Visit | Attending: Cardiovascular Disease | Admitting: Cardiovascular Disease

## 2018-09-01 DIAGNOSIS — I214 Non-ST elevation (NSTEMI) myocardial infarction: Secondary | ICD-10-CM | POA: Diagnosis not present

## 2018-09-02 ENCOUNTER — Other Ambulatory Visit: Payer: Self-pay | Admitting: Cardiovascular Disease

## 2018-09-04 ENCOUNTER — Encounter (HOSPITAL_COMMUNITY)
Admission: RE | Admit: 2018-09-04 | Discharge: 2018-09-04 | Disposition: A | Discharge status: Home / Self Care | Payer: 59 | Source: Ambulatory Visit | Attending: Cardiovascular Disease | Admitting: Cardiovascular Disease

## 2018-09-04 DIAGNOSIS — I214 Non-ST elevation (NSTEMI) myocardial infarction: Secondary | ICD-10-CM

## 2018-09-04 DIAGNOSIS — Z955 Presence of coronary angioplasty implant and graft: Secondary | ICD-10-CM

## 2018-09-04 MED ORDER — ISOSORBIDE MONONITRATE ER 30 MG PO TB24: 15.0000 mg | ORAL_TABLET | Freq: Every day | ORAL | 2 refills | Status: DC

## 2018-09-04 NOTE — Progress Notes (Signed)
Daily Session Note  Patient Details  Name: [name] Foster MRN: [id number] Date of Birth: May 05, 1956 Referring Provider:     CARDIAC REHAB PHASE II ORIENTATION from 08/28/2018 in [place]  Referring Provider  Branch       Encounter Date: 09/04/2018  Check In: Session Check In - 09/04/18 1014      Check-In   Supervising physician immediately available to respond to emergencies  See telemetry face sheet for immediately available MD    Location  [facility]    Staff Present  Benay Pike, Exercise Physiologist;Debra Wynetta Emery, RN, BSN;Diane Coad, MS, EP, [facility], Exercise Physiologist    Medication changes reported      No    Fall or balance concerns reported     No    Warm-up and Cool-down  Performed as group-led instruction    Resistance Training Performed  Yes    VAD Patient?  No    PAD/SET Patient?  No      Pain Assessment   Currently in Pain?  No/denies    Pain Score  0-No pain    Multiple Pain Sites  No       Capillary Blood Glucose: No results found for this or any previous visit (from the past 24 hour(s)).    Social History   Tobacco Use  Smoking Status Former Smoker  . Packs/day: 2.00  . Years: 30.00  . Pack years: 60.00  . Last attempt to quit: 03/25/2017  . Years since quitting: 1.4  Smokeless Tobacco Never Used    Goals Met:  Independence with exercise equipment Exercise tolerated well No report of cardiac concerns or symptoms Strength training completed today  Goals Unmet:  Not Applicable  Comments: Pt able to follow exercise prescription today without complaint.  Will continue to monitor for progression. Check out 1030.   Dr. Kate Sable is Medical Director for [facility] Cardiac and Pulmonary Rehab.

## 2018-09-04 NOTE — Addendum Note (Signed)
Addended by: Derl Barrow on: 09/04/2018 12:53 PM   Modules accepted: Orders

## 2018-09-06 ENCOUNTER — Encounter (HOSPITAL_COMMUNITY): Payer: 59

## 2018-09-08 ENCOUNTER — Encounter (HOSPITAL_COMMUNITY): Payer: 59

## 2018-09-11 ENCOUNTER — Encounter (HOSPITAL_COMMUNITY)
Admission: RE | Admit: 2018-09-11 | Discharge: 2018-09-11 | Disposition: A | Discharge status: Home / Self Care | Payer: 59 | Source: Ambulatory Visit | Attending: Cardiovascular Disease | Admitting: Cardiovascular Disease

## 2018-09-11 DIAGNOSIS — I214 Non-ST elevation (NSTEMI) myocardial infarction: Secondary | ICD-10-CM

## 2018-09-11 DIAGNOSIS — Z955 Presence of coronary angioplasty implant and graft: Secondary | ICD-10-CM

## 2018-09-11 NOTE — Progress Notes (Signed)
Daily Session Note  Patient Details  Name: [name] Foster MRN: [id number] Date of Birth: June 11, 1956 Referring Provider:     CARDIAC REHAB PHASE II ORIENTATION from 08/28/2018 in [place]  Referring Provider  Branch       Encounter Date: 09/11/2018  Check In: Session Check In - 09/11/18 0930      Check-In   Supervising physician immediately available to respond to emergencies  See telemetry face sheet for immediately available MD    Location  [facility]    Staff Present  Benay Pike, Exercise Physiologist;Diane Coad, MS, EP, [facility], Exercise Physiologist;Other    Medication changes reported      No    Fall or balance concerns reported     No    Warm-up and Cool-down  Performed as group-led instruction    Resistance Training Performed  Yes    VAD Patient?  No    PAD/SET Patient?  No      Pain Assessment   Currently in Pain?  No/denies    Pain Score  0-No pain    Multiple Pain Sites  No       Capillary Blood Glucose: No results found for this or any previous visit (from the past 24 hour(s)).    Social History   Tobacco Use  Smoking Status Former Smoker  . Packs/day: 2.00  . Years: 30.00  . Pack years: 60.00  . Last attempt to quit: 03/25/2017  . Years since quitting: 1.4  Smokeless Tobacco Never Used    Goals Met:  Independence with exercise equipment Exercise tolerated well No report of cardiac concerns or symptoms Strength training completed today  Goals Unmet:  Not Applicable  Comments: Pt able to follow exercise prescription today without complaint.  Will continue to monitor for progression. Check out 1030.   Dr. Kate Sable is Medical Director for [facility] Cardiac and Pulmonary Rehab.

## 2018-09-13 ENCOUNTER — Encounter (HOSPITAL_COMMUNITY): Payer: 59

## 2018-09-15 ENCOUNTER — Encounter (HOSPITAL_COMMUNITY)
Admission: RE | Admit: 2018-09-15 | Discharge: 2018-09-15 | Disposition: A | Discharge status: Home / Self Care | Payer: 59 | Source: Ambulatory Visit | Attending: Cardiovascular Disease | Admitting: Cardiovascular Disease

## 2018-09-15 DIAGNOSIS — Z955 Presence of coronary angioplasty implant and graft: Secondary | ICD-10-CM | POA: Diagnosis present

## 2018-09-15 DIAGNOSIS — I214 Non-ST elevation (NSTEMI) myocardial infarction: Secondary | ICD-10-CM | POA: Diagnosis present

## 2018-09-15 NOTE — Progress Notes (Signed)
Daily Session Note  Patient Details  Name: [name] Foster MRN: [id number] Date of Birth: Feb 13, 1956 Referring Provider:     CARDIAC REHAB PHASE II ORIENTATION from 08/28/2018 in [place]  Referring Provider  Branch       Encounter Date: 09/15/2018  Check In: Session Check In - 09/15/18 0930      Check-In   Supervising physician immediately available to respond to emergencies  See telemetry face sheet for immediately available MD    Location  [facility]    Staff Present  Benay Pike, Exercise Physiologist;Diane Coad, MS, EP, [facility], Exercise Physiologist;Max Romano Wynetta Emery, RN, BSN    Medication changes reported      No    Fall or balance concerns reported     No    Warm-up and Cool-down  Performed as group-led instruction    Resistance Training Performed  Yes    VAD Patient?  No    PAD/SET Patient?  No      Pain Assessment   Currently in Pain?  No/denies    Pain Score  0-No pain    Multiple Pain Sites  No       Capillary Blood Glucose: No results found for this or any previous visit (from the past 24 hour(s)).    Social History   Tobacco Use  Smoking Status Former Smoker  . Packs/day: 2.00  . Years: 30.00  . Pack years: 60.00  . Last attempt to quit: 03/25/2017  . Years since quitting: 1.4  Smokeless Tobacco Never Used    Goals Met:  Independence with exercise equipment Exercise tolerated well No report of cardiac concerns or symptoms Strength training completed today  Goals Unmet:  Not Applicable  Comments: Pt able to follow exercise prescription today without complaint.  Will continue to monitor for progression. Check out 1030.    Dr. Kate Sable is Medical Director for [facility] Cardiac and Pulmonary Rehab.

## 2018-09-18 ENCOUNTER — Encounter (HOSPITAL_COMMUNITY): Payer: 59

## 2018-09-20 ENCOUNTER — Encounter (HOSPITAL_COMMUNITY)
Admission: RE | Admit: 2018-09-20 | Discharge: 2018-09-20 | Disposition: A | Discharge status: Home / Self Care | Payer: 59 | Source: Ambulatory Visit | Attending: Cardiovascular Disease | Admitting: Cardiovascular Disease

## 2018-09-20 DIAGNOSIS — I214 Non-ST elevation (NSTEMI) myocardial infarction: Secondary | ICD-10-CM | POA: Diagnosis not present

## 2018-09-20 DIAGNOSIS — Z955 Presence of coronary angioplasty implant and graft: Secondary | ICD-10-CM

## 2018-09-20 NOTE — Research (Signed)
Patient seen and examined.  Stable post infusions.  Tolerated research study without difficulty.  Currently stable.   VS as recorded. No JVD Lungs clear Cardiac regular   Patient has completed protocol.   Loretha Brasil. Lia Foyer, MD, [place] Director, [facility]

## 2018-09-20 NOTE — Progress Notes (Signed)
Daily Session Note  Patient Details  Name: [name] Foster MRN: [id number] Date of Birth: [birth year] Referring Provider:     CARDIAC REHAB PHASE II ORIENTATION from 08/28/2018 in [place]  Referring Provider  Branch       Encounter Date: 09/20/2018  Check In: Session Check In - 09/20/18 0930      Check-In   Supervising physician immediately available to respond to emergencies  See telemetry face sheet for immediately available MD    Location  [facility]    Staff Present  Benay Pike, Exercise Physiologist;Diane Coad, MS, EP, [facility], Exercise Physiologist;Selena Swaminathan Wynetta Emery, RN, BSN    Medication changes reported      No    Fall or balance concerns reported     No    Warm-up and Cool-down  Performed as group-led instruction    Resistance Training Performed  Yes    VAD Patient?  No    PAD/SET Patient?  No      Pain Assessment   Currently in Pain?  No/denies    Pain Score  0-No pain    Multiple Pain Sites  No       Capillary Blood Glucose: No results found for this or any previous visit (from the past 24 hour(s)).    Social History   Tobacco Use  Smoking Status Former Smoker  . Packs/day: 2.00  . Years: 30.00  . Pack years: 60.00  . Last attempt to quit: 03/25/2017  . Years since quitting: 1.4  Smokeless Tobacco Never Used    Goals Met:  Independence with exercise equipment Exercise tolerated well No report of cardiac concerns or symptoms Strength training completed today  Goals Unmet:  Not Applicable  Comments: Pt able to follow exercise prescription today without complaint.  Will continue to monitor for progression. Check out 1030.   Dr. Kate Sable is Medical Director for [facility] Cardiac and Pulmonary Rehab.

## 2018-09-21 NOTE — Progress Notes (Signed)
Cardiac Individual Treatment Plan  Patient Details  Name: [name] Foster MRN: [id number] Date of Birth: [birth year] Referring Provider:     CARDIAC REHAB PHASE II ORIENTATION from 08/28/2018 in [place]  Referring Provider  Branch       Initial Encounter Date:    CARDIAC REHAB PHASE II ORIENTATION from 08/28/2018 in [place]  Date  08/28/18      Visit Diagnosis: NSTEMI (non-ST elevated myocardial infarction) [facility], [place])  Status post coronary artery stent placement  Patient's Home Medications on Admission:  Current Outpatient Medications:  .  amLODipine (NORVASC) 5 MG tablet, TAKE 1 TABLET BY MOUTH EVERY DAY, Disp: 30 tablet, Rfl: 10 .  atorvastatin (LIPITOR) 40 MG tablet, Take 1 tablet (40 mg total) by mouth daily., Disp: 90 tablet, Rfl: 3 .  clopidogrel (PLAVIX) 75 MG tablet, Take 1 tablet (75 mg total) by mouth daily., Disp: 30 tablet, Rfl: 11 .  [employer] ASPIRIN LOW STRENGTH 81 MG EC tablet, TAKE 1 TABLET BY MOUTH EVERY DAY, Disp: 90 tablet, Rfl: 3 .  hydrALAZINE (APRESOLINE) 50 MG tablet, TAKE 1 TABLET (50 MG TOTAL) BY MOUTH EVERY 8 (EIGHT) HOURS., Disp: 270 tablet, Rfl: 3 .  HYDROcodone-acetaminophen (NORCO) 7.5-325 MG tablet, Take 1 tablet by mouth every 6 (six) hours as needed for moderate pain., Disp: , Rfl:  .  isosorbide mononitrate (IMDUR) 30 MG 24 hr tablet, Take 0.5 tablets (15 mg total) by mouth daily., Disp: 45 tablet, Rfl: 2 .  losartan (COZAAR) 100 MG tablet, Take 1 tablet (100 mg total) by mouth daily., Disp: 30 tablet, Rfl: 11 .  metFORMIN (GLUCOPHAGE) 1000 MG tablet, Take 500 mg by mouth 2 (two) times daily with a meal., Disp: , Rfl: 6 .  metoprolol tartrate (LOPRESSOR) 50 MG tablet, Take 1 tablet (50 mg total) by mouth 2 (two) times daily., Disp: 180 tablet, Rfl: 3 .  nitroGLYCERIN (NITROSTAT) 0.4 MG SL tablet, Place 1 tablet (0.4 mg total) under the tongue every 5 (five) minutes x 3 doses as needed for chest pain., Disp:  25 tablet, Rfl: 11 .  zolpidem (AMBIEN) 10 MG tablet, Take 10 mg by mouth at bedtime as needed for sleep. , Disp: , Rfl:   Past Medical History: Past Medical History:  Diagnosis Date  . CAD in native artery    a. 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 DES to left PDA 09/20/17  . Diabetes mellitus without complication ([place])   . High cholesterol   . Hyperlipidemia   . Hypertension   . Insomnia   . Pulmonary hypertension ([place])    a. TTE 03/25/17; EF 60-65%, nl WM, LV diastolic fxn nl, trivial AI, RV cavity size, wall thickness, and sys fxn nl, trivial TR, PASP 39 mmHg, trivial pericardial effusion  . Sleep apnea    could not use a cpap  . Wears dentures    full top-partial bottom  . Wears glasses     Tobacco Use: Social History   Tobacco Use  Smoking Status Former Smoker  . Packs/day: 2.00  . Years: 30.00  . Pack years: 60.00  . Last attempt to quit: 03/25/2017  . Years since quitting: 1.4  Smokeless Tobacco Never Used    Labs: Recent Review Scientist, physiological    Labs for ITP Cardiac and Pulmonary Rehab Latest Ref Rng & Units 03/25/2017 05/13/2017  07/01/2018 08/24/2018   Cholestrol 100 - 199 mg/dL 163 81(L) 151 111   LDLCALC 0 - 99 mg/dL 92 41 81 51   HDL >39 mg/dL 25(L) 24(L) 34(L) 45   Trlycerides 0 - 149 mg/dL 229(H) 82 180(H) 73   Hemoglobin A1c 4.8 - 5.6 % 9.4(H) - - -   PHART 7.350 - 7.450 7.206(L) - - -   PCO2ART 32.0 - 48.0 mmHg 53.8(H) - - -   HCO3 20.0 - 28.0 mmol/L 21.3 - - -   TCO2 0 - 100 mmol/L 23 - - -   ACIDBASEDEF 0.0 - 2.0 mmol/L 7.0(H) - - -   O2SAT % 99.0 - - -      Capillary Blood Glucose: Lab Results  Component Value Date   GLUCAP 120 (H) 07/04/2018   GLUCAP 113 (H) 07/04/2018   GLUCAP 170 (H) 07/03/2018   GLUCAP 98 07/03/2018   GLUCAP 102 (H) 07/03/2018     Exercise Target Goals: Exercise Program Goal: Individual  exercise prescription set using results from initial 6 min walk test and THRR while considering  patient's activity barriers and safety.   Exercise Prescription Goal: Starting with aerobic activity 30 plus minutes a day, 3 days per week for initial exercise prescription. Provide home exercise prescription and guidelines that participant acknowledges understanding prior to discharge.  Activity Barriers & Risk Stratification: Activity Barriers & Cardiac Risk Stratification - 08/28/18 1021      Activity Barriers & Cardiac Risk Stratification   Activity Barriers  None    Cardiac Risk Stratification  High       6 Minute Walk: 6 Minute Walk    Row Name 08/28/18 1020         6 Minute Walk   Phase  Initial     Distance  1600 feet     Walk Time  6 minutes     # of Rest Breaks  0     MPH  3.03     METS  3.32     RPE  11     Perceived Dyspnea   8     VO2 Peak  12.47     Symptoms  No     Resting HR  59 bpm     Resting BP  120/64     Resting Oxygen Saturation   95 %     Exercise Oxygen Saturation  during 6 min walk  97 %     Max Ex. HR  76 bpm     Max Ex. BP  138/64     2 Minute Post BP  126/64        Oxygen Initial Assessment:   Oxygen Re-Evaluation:   Oxygen Discharge (Final Oxygen Re-Evaluation):   Initial Exercise Prescription: Initial Exercise Prescription - 08/28/18 1000      Date of Initial Exercise RX and Referring Provider   Date  08/28/18    Referring Provider  Branch     Expected Discharge Date  11/27/18      Treadmill   MPH  1.6    Grade  0    Minutes  17    METs  2.22      Recumbant Elliptical   Level  1    RPM  51    Watts  55    Minutes  22    METs  3.6      Prescription Details   Frequency (times per week)  3    Duration  Progress  to 30 minutes of continuous aerobic without signs/symptoms of physical distress      Intensity   THRR 40-80% of Max Heartrate  [phone]    Ratings of Perceived Exertion  11-13    Perceived Dyspnea  0-4       Progression   Progression  Continue progressive overload as per policy without signs/symptoms or physical distress.      Resistance Training   Training Prescription  Yes    Weight  3    Reps  10-15       Perform Capillary Blood Glucose checks as needed.  Exercise Prescription Changes:  Exercise Prescription Changes    Row Name 08/28/18 1000 09/12/18 1200           Response to Exercise   Blood Pressure (Admit)  120/64  118/68      Blood Pressure (Exercise)  138/64  108/70      Blood Pressure (Exit)  126/64  112/60      Heart Rate (Admit)  59 bpm  60 bpm      Heart Rate (Exercise)  76 bpm  87 bpm      Heart Rate (Exit)  63 bpm  70 bpm      Oxygen Saturation (Admit)  95 %  -      Oxygen Saturation (Exercise)  97 %  -      Oxygen Saturation (Exit)  96 %  -      Rating of Perceived Exertion (Exercise)  11  12      Perceived Dyspnea (Exercise)  8  -      Comments  6 minute walk test  fist two weeks of exercise       Duration  Progress to 30 minutes of  aerobic without signs/symptoms of physical distress  Progress to 30 minutes of  aerobic without signs/symptoms of physical distress      Intensity  THRR New [phone]  THRR unchanged        Progression   Progression  -  Continue to progress workloads to maintain intensity without signs/symptoms of physical distress.      Average METs  -  4.1        Resistance Training   Training Prescription  -  Yes      Weight  -  3      Reps  -  10-15        Treadmill   MPH  -  1.8      Grade  -  0      Minutes  -  17      METs  -  2.37        Recumbant Elliptical   Level  -  1      RPM  -  74      Watts  -  117      Minutes  -  22      METs  -  5.8        Home Exercise Plan   Plans to continue exercise at  -  Home (comment)      Frequency  -  Add 2 additional days to program exercise sessions.      Initial Home Exercises Provided  -  08/28/18         Exercise Comments:  Exercise Comments    Row Name 09/19/18 1009            Exercise Comments  Pt. has done well  in CR so far. He has attended 6 exercise sessions, working each time to increase his distance on both machines as well as his overall MET level. Once he is able to return to attending regularly he will see much progress.           Exercise Goals and Review:  Exercise Goals    Row Name 08/28/18 1023             Exercise Goals   Increase Physical Activity  Yes       Intervention  Provide advice, education, support and counseling about physical activity/exercise needs.;Develop an individualized exercise prescription for aerobic and resistive training based on initial evaluation findings, risk stratification, comorbidities and participant's personal goals.       Expected Outcomes  Short Term: Attend rehab on a regular basis to increase amount of physical activity.       Increase Strength and Stamina  Yes       Intervention  Provide advice, education, support and counseling about physical activity/exercise needs.;Develop an individualized exercise prescription for aerobic and resistive training based on initial evaluation findings, risk stratification, comorbidities and participant's personal goals.       Expected Outcomes  Short Term: Increase workloads from initial exercise prescription for resistance, speed, and METs.;Long Term: Improve cardiorespiratory fitness, muscular endurance and strength as measured by increased METs and functional capacity (6MWT);Short Term: Perform resistance training exercises routinely during rehab and add in resistance training at home       Able to understand and use rate of perceived exertion (RPE) scale  Yes       Intervention  Provide education and explanation on how to use RPE scale       Expected Outcomes  Short Term: Able to use RPE daily in rehab to express subjective intensity level;Long Term:  Able to use RPE to guide intensity level when exercising independently       Able to understand and use Dyspnea scale   Yes       Intervention  Provide education and explanation on how to use Dyspnea scale       Expected Outcomes  Short Term: Able to use Dyspnea scale daily in rehab to express subjective sense of shortness of breath during exertion;Long Term: Able to use Dyspnea scale to guide intensity level when exercising independently       Knowledge and understanding of Target Heart Rate Range (THRR)  Yes       Intervention  Provide education and explanation of THRR including how the numbers were predicted and where they are located for reference       Expected Outcomes  Short Term: Able to state/look up THRR       Able to check pulse independently  Yes       Intervention  Provide education and demonstration on how to check pulse in carotid and radial arteries.;Review the importance of being able to check your own pulse for safety during independent exercise       Expected Outcomes  Short Term: Able to explain why pulse checking is important during independent exercise;Long Term: Able to check pulse independently and accurately       Understanding of Exercise Prescription  Yes       Intervention  Provide education, explanation, and written materials on patient's individual exercise prescription       Expected Outcomes  Short Term: Able to explain program exercise prescription;Long Term: Able to explain home exercise prescription to exercise independently  Exercise Goals Re-Evaluation : Exercise Goals Re-Evaluation    Row Name 09/19/18 1007             Exercise Goal Re-Evaluation   Exercise Goals Review  Increase Physical Activity;Increase Strength and Stamina;Able to understand and use rate of perceived exertion (RPE) scale;Knowledge and understanding of Target Heart Rate Range (THRR);Able to check pulse independently;Understanding of Exercise Prescription       Comments  Pt. has attended 6 exercise sessions so far. He is eager to get back in to the swing of things and increase his strength and  stamina. He has missed a few days due to family issues but works hard when he is here. We will continue to monitor and progress as we see fit.        Expected Outcomes  to lose weight, his goal is 25 pounds            Discharge Exercise Prescription (Final Exercise Prescription Changes): Exercise Prescription Changes - 09/12/18 1200      Response to Exercise   Blood Pressure (Admit)  118/68    Blood Pressure (Exercise)  108/70    Blood Pressure (Exit)  112/60    Heart Rate (Admit)  60 bpm    Heart Rate (Exercise)  87 bpm    Heart Rate (Exit)  70 bpm    Rating of Perceived Exertion (Exercise)  12    Comments  fist two weeks of exercise     Duration  Progress to 30 minutes of  aerobic without signs/symptoms of physical distress    Intensity  THRR unchanged      Progression   Progression  Continue to progress workloads to maintain intensity without signs/symptoms of physical distress.    Average METs  4.1      Resistance Training   Training Prescription  Yes    Weight  3    Reps  10-15      Treadmill   MPH  1.8    Grade  0    Minutes  17    METs  2.37      Recumbant Elliptical   Level  1    RPM  74    Watts  117    Minutes  22    METs  5.8      Home Exercise Plan   Plans to continue exercise at  Home (comment)    Frequency  Add 2 additional days to program exercise sessions.    Initial Home Exercises Provided  08/28/18       Nutrition:  Target Goals: Understanding of nutrition guidelines, daily intake of sodium <1512m, cholesterol <2061m calories 30% from fat and 7% or less from saturated fats, daily to have 5 or more servings of fruits and vegetables.  Biometrics: Pre Biometrics - 08/28/18 1024      Pre Biometrics   Height  _0  (1.753 m)    Waist Circumference  40.5 inches    Hip Circumference  36 inches    Waist to Hip Ratio  1.12 %    Triceps Skinfold  6 mm    % Body Fat  24.3 %    Grip Strength  13.2 kg    Flexibility  14.5 in    Single Leg Stand   37.89 seconds        Nutrition Therapy Plan and Nutrition Goals: Nutrition Therapy & Goals - 09/21/18 0753      Nutrition Therapy   RD appointment deferred  Yes  Personal Nutrition Goals   Comments  Patient has not meet with RD. This is his 2nd time in the program and he meet with him the 1st time. He says he has not changed his diet since starting the program. He follows a diabetic, low NA diet. Will continue to monitor.       Intervention Plan   Intervention  Nutrition handout(s) given to patient.       Nutrition Assessments: Nutrition Assessments - 08/28/18 1137      MEDFICTS Scores   Pre Score  18       Nutrition Goals Re-Evaluation:   Nutrition Goals Discharge (Final Nutrition Goals Re-Evaluation):   Psychosocial: Target Goals: Acknowledge presence or absence of significant depression and/or stress, maximize coping skills, provide positive support system. Participant is able to verbalize types and ability to use techniques and skills needed for reducing stress and depression.  Initial Review & Psychosocial Screening: Initial Psych Review & Screening - 08/28/18 1136      Initial Review   Current issues with  None Identified      Family Dynamics   Good Support System?  Yes      Barriers   Psychosocial barriers to participate in program  There are no identifiable barriers or psychosocial needs.      Screening Interventions   Interventions  Encouraged to exercise    Expected Outcomes  Short Term goal: Identification and review with participant of any Quality of Life or Depression concerns found by scoring the questionnaire.;Long Term goal: The participant improves quality of Life and PHQ9 Scores as seen by post scores and/or verbalization of changes       Quality of Life Scores: Quality of Life - 08/28/18 1025      Quality of Life   Select  Quality of Life      Quality of Life Scores   Health/Function Pre  19.63 %    Socioeconomic Pre  23.43 %     Psych/Spiritual Pre  21.79 %    Family Pre  27.6 %    GLOBAL Pre  22.03 %      Scores of 19 and below usually indicate a poorer quality of life in these areas.  A difference of  2-3 points is a clinically meaningful difference.  A difference of 2-3 points in the total score of the Quality of Life Index has been associated with significant improvement in overall quality of life, self-image, physical symptoms, and general health in studies assessing change in quality of life.  PHQ-9: Recent Review Flowsheet Data    Depression screen [facility] 2/9 08/28/2018 02/27/2018 10/25/2017 04/26/2017   Decreased Interest 0 0 0 0   Down, Depressed, Hopeless 0 0 0 0   PHQ - 2 Score 0 0 0 0   Altered sleeping _0 -   Tired, decreased energy _1 -   Change in appetite 1 1 0 -   Feeling bad or failure about yourself  0 0 0 -   Trouble concentrating 0 0 0 -   Moving slowly or fidgety/restless 0 0 0 -   Suicidal thoughts 0 0 0 -   PHQ-9 Score _2 -   Difficult doing work/chores Not difficult at all Not difficult at all Not difficult at all -     Interpretation of Total Score  Total Score Depression Severity:  1-4 = Minimal depression, 5-9 = Mild depression, 10-14 = Moderate depression, 15-19 = Moderately severe depression, 20-27 =  Severe depression   Psychosocial Evaluation and Intervention: Psychosocial Evaluation - 08/28/18 1136      Psychosocial Evaluation & Interventions   Interventions  Encouraged to exercise with the program and follow exercise prescription    Continue Psychosocial Services   No Follow up required       Psychosocial Re-Evaluation: Psychosocial Re-Evaluation    [place] Name 09/21/18 0758             Psychosocial Re-Evaluation   Current issues with  None Identified       Comments  Patient's initial QOL score was 22.03 and his PHQ-9 score was with no psychosocial issues identified.        Expected Outcomes  Patient will have no psychosocial issues identified at discharge.         Interventions  Stress management education;Encouraged to attend Cardiac Rehabilitation for the exercise;Relaxation education       Continue Psychosocial Services   No Follow up required          Psychosocial Discharge (Final Psychosocial Re-Evaluation): Psychosocial Re-Evaluation - 09/21/18 0758      Psychosocial Re-Evaluation   Current issues with  None Identified    Comments  Patient's initial QOL score was 22.03 and his PHQ-9 score was with no psychosocial issues identified.     Expected Outcomes  Patient will have no psychosocial issues identified at discharge.     Interventions  Stress management education;Encouraged to attend Cardiac Rehabilitation for the exercise;Relaxation education    Continue Psychosocial Services   No Follow up required       Vocational Rehabilitation: Provide vocational rehab assistance to qualifying candidates.   Vocational Rehab Evaluation & Intervention: Vocational Rehab - 08/28/18 1138      Initial Vocational Rehab Evaluation & Intervention   Assessment shows need for Vocational Rehabilitation  No       Education: Education Goals: Education classes will be provided on a weekly basis, covering required topics. Participant will state understanding/return demonstration of topics presented.  Learning Barriers/Preferences: Learning Barriers/Preferences - 08/28/18 1138      Learning Barriers/Preferences   Learning Barriers  None    Learning Preferences  Skilled Demonstration;Group Instruction;Individual Instruction       Education Topics: Hypertension, Hypertension Reduction -Define heart disease and high blood pressure. Discus how high blood pressure affects the body and ways to reduce high blood pressure.   Exercise and Your Heart -Discuss why it is important to exercise, the FITT principles of exercise, normal and abnormal responses to exercise, and how to exercise safely.   CARDIAC REHAB PHASE II EXERCISE from 02/15/2018 in [place]  Date  01/25/18  Educator  DC  Instruction Review Code  2- Demonstrated Understanding      Angina -Discuss definition of angina, causes of angina, treatment of angina, and how to decrease risk of having angina.   CARDIAC REHAB PHASE II EXERCISE from 02/15/2018 in [place]  Date  11/02/17  Educator  DJ  Instruction Review Code  2- Demonstrated Understanding      Cardiac Medications -Review what the following cardiac medications are used for, how they affect the body, and side effects that may occur when taking the medications.  Medications include Aspirin, Beta blockers, calcium channel blockers, ACE Inhibitors, angiotensin receptor blockers, diuretics, digoxin, and antihyperlipidemics.   CARDIAC REHAB PHASE II EXERCISE from 02/15/2018 in [place]  Date  11/09/17  Educator  DC  Instruction Review Code  2- Demonstrated Understanding  Congestive Heart Failure -Discuss the definition of CHF, how to live with CHF, the signs and symptoms of CHF, and how keep track of weight and sodium intake.   CARDIAC REHAB PHASE II EXERCISE from 02/15/2018 in [place]  Date  11/16/17  Educator  DC  Instruction Review Code  2- Demonstrated Understanding      Heart Disease and Intimacy -Discus the effect sexual activity has on the heart, how changes occur during intimacy as we age, and safety during sexual activity.   Smoking Cessation / COPD -Discuss different methods to quit smoking, the health benefits of quitting smoking, and the definition of COPD.   CARDIAC REHAB PHASE II EXERCISE from 02/15/2018 in [place]  Date  12/01/17  Educator  DC  Instruction Review Code  2- Demonstrated Understanding      Nutrition I: Fats -Discuss the types of cholesterol, what cholesterol does to the heart, and how cholesterol levels can be controlled.   CARDIAC REHAB PHASE II EXERCISE from  02/15/2018 in [place]  Date  12/07/17  Educator  DC  Instruction Review Code  2- Demonstrated Understanding      Nutrition II: Labels -Discuss the different components of food labels and how to read food label   CARDIAC REHAB PHASE II EXERCISE from 09/20/2018 in [place]  Date  09/15/18  Educator  C[place]  Instruction Review Code  2- Demonstrated Understanding      Heart Parts/Heart Disease and PAD -Discuss the anatomy of the heart, the pathway of blood circulation through the heart, and these are affected by heart disease.   CARDIAC REHAB PHASE II EXERCISE from 09/20/2018 in [place]  Date  09/20/18  Educator  Wynetta Emery  Instruction Review Code  2- Demonstrated Understanding      Stress I: Signs and Symptoms -Discuss the causes of stress, how stress may lead to anxiety and depression, and ways to limit stress.   Stress II: Relaxation -Discuss different types of relaxation techniques to limit stress.   Warning Signs of Stroke / TIA -Discuss definition of a stroke, what the signs and symptoms are of a stroke, and how to identify when someone is having stroke.   Knowledge Questionnaire Score: Knowledge Questionnaire Score - 08/28/18 1138      Knowledge Questionnaire Score   Pre Score  23/24       Core Components/Risk Factors/Patient Goals at Admission: Personal Goals and Risk Factors at Admission - 08/28/18 1138      Core Components/Risk Factors/Patient Goals on Admission    Weight Management  Yes    Admit Weight  195 lb 9.6 oz (88.7 kg)    Goal Weight: Short Term  185 lb 9.6 oz (84.2 kg)    Goal Weight: Long Term  175 lb 9.6 oz (79.7 kg)    Expected Outcomes  Short Term: Continue to assess and modify interventions until short term weight is achieved;Long Term: Adherence to nutrition and physical activity/exercise program aimed toward attainment of established weight goal    Personal Goal Other  Yes     Personal Goal  Lose 25lbs, regain heat health    Intervention  Attend CR 3 x week and suopplement with at home exercise 2 x week.    Expected Outcomes  Reach personal goals       Core Components/Risk Factors/Patient Goals Review:  Goals and Risk Factor Review    Row Name 09/21/18 [phone]  Core Components/Risk Factors/Patient Goals Review   Personal Goals Review  Weight Management/Obesity;Diabetes Get healthier; lose 20 lbs.        Review  Patient has completed 7 sessions maintaining his weigth since his initial visit. He is doing well in the program with progression. He recently completed the program and was in our forever fit maintenance program but had another event. He says he is getting in better physical shape and feels like he is getting his heart healthy again. He blood pressure is better controlled and his reported fasting glucose readings are averaging 130. He has no recent A1C's on file. Will continue to monitor for progress.        Expected Outcomes  Patient will continue to attend sessions and complete the program meeting his personal goals.           Core Components/Risk Factors/Patient Goals at Discharge (Final Review):  Goals and Risk Factor Review - 09/21/18 0754      Core Components/Risk Factors/Patient Goals Review   Personal Goals Review  Weight Management/Obesity;Diabetes   Get healthier; lose 20 lbs.    Review  Patient has completed 7 sessions maintaining his weigth since his initial visit. He is doing well in the program with progression. He recently completed the program and was in our forever fit maintenance program but had another event. He says he is getting in better physical shape and feels like he is getting his heart healthy again. He blood pressure is better controlled and his reported fasting glucose readings are averaging 130. He has no recent A1C's on file. Will continue to monitor for progress.     Expected Outcomes  Patient will continue to  attend sessions and complete the program meeting his personal goals.        ITP Comments: ITP Comments    Row Name 08/28/18 1115           ITP Comments  Mr. Bitting finished our program, joined maintenance. He has recently had another episode and is now coming back through the program. He is eager to get started.           Comments: ITP REVIEW Patient doing well in the program. Will continue to monitor for progress.

## 2018-09-22 ENCOUNTER — Telehealth: Payer: Self-pay | Admitting: *Deleted

## 2018-09-22 ENCOUNTER — Encounter: Payer: Self-pay | Admitting: *Deleted

## 2018-09-22 ENCOUNTER — Encounter (HOSPITAL_COMMUNITY)
Admission: RE | Admit: 2018-09-22 | Discharge: 2018-09-22 | Disposition: A | Discharge status: Home / Self Care | Payer: 59 | Source: Ambulatory Visit | Attending: Cardiovascular Disease | Admitting: Cardiovascular Disease

## 2018-09-22 DIAGNOSIS — I214 Non-ST elevation (NSTEMI) myocardial infarction: Secondary | ICD-10-CM | POA: Diagnosis not present

## 2018-09-22 DIAGNOSIS — Z955 Presence of coronary angioplasty implant and graft: Secondary | ICD-10-CM

## 2018-09-22 DIAGNOSIS — Z006 Encounter for examination for normal comparison and control in clinical research program: Secondary | ICD-10-CM

## 2018-09-22 NOTE — Telephone Encounter (Signed)
See encounter for appt

## 2018-09-22 NOTE — Research (Signed)
Visit 7 phone call only  Out of window, played phone tag with patient.   Pt doing well, no complaints of cp or sob. Decreased Lipitor to 40 mg daily. Will see pt back on 09/29/2018 for his visit 8.                                    "CONSENT"   YES     NO   Continuing further Investigational Product and study visits for follow-up? [x]  []   Continuing consent from future biomedical research [x]  []                                   "EVENTS"    YES     NO  AE   (IF YES SEE SOURCE) []  [x]   SAE  (IF YES SEE SOURCE) []  [x]   ENDPOINT   (IF YES SEE SOURCE) []  [x]   REVASCULARIZATION  (IF YES SEE SOURCE) []  [x]   AMPUTATION   (IF YES SEE SOURCE) []  [x]   TROPONIN'S  (IF YES SEE SOURCE) []  [x]     Current Outpatient Medications:  .  amLODipine (NORVASC) 5 MG tablet, TAKE 1 TABLET BY MOUTH EVERY DAY, Disp: 30 tablet, Rfl: 10 .  atorvastatin (LIPITOR) 40 MG tablet, Take 1 tablet (40 mg total) by mouth daily., Disp: 90 tablet, Rfl: 3 .  clopidogrel (PLAVIX) 75 MG tablet, Take 1 tablet (75 mg total) by mouth daily., Disp: 30 tablet, Rfl: 11 .  [employer] ASPIRIN LOW STRENGTH 81 MG EC tablet, TAKE 1 TABLET BY MOUTH EVERY DAY, Disp: 90 tablet, Rfl: 3 .  hydrALAZINE (APRESOLINE) 50 MG tablet, TAKE 1 TABLET (50 MG TOTAL) BY MOUTH EVERY 8 (EIGHT) HOURS., Disp: 270 tablet, Rfl: 3 .  HYDROcodone-acetaminophen (NORCO) 7.5-325 MG tablet, Take 1 tablet by mouth every 6 (six) hours as needed for moderate pain., Disp: , Rfl:  .  isosorbide mononitrate (IMDUR) 30 MG 24 hr tablet, Take 0.5 tablets (15 mg total) by mouth daily., Disp: 45 tablet, Rfl: 2 .  losartan (COZAAR) 100 MG tablet, Take 1 tablet (100 mg total) by mouth daily., Disp: 30 tablet, Rfl: 11 .  metFORMIN (GLUCOPHAGE) 1000 MG tablet, Take 500 mg by mouth 2 (two) times daily with a meal., Disp: , Rfl: 6 .  metoprolol tartrate (LOPRESSOR) 50 MG tablet, Take 1 tablet (50 mg total) by mouth 2 (two) times daily., Disp: 180 tablet, Rfl: 3 .  nitroGLYCERIN (NITROSTAT)  0.4 MG SL tablet, Place 1 tablet (0.4 mg total) under the tongue every 5 (five) minutes x 3 doses as needed for chest pain., Disp: 25 tablet, Rfl: 11 .  zolpidem (AMBIEN) 10 MG tablet, Take 10 mg by mouth at bedtime as needed for sleep. , Disp: , Rfl:

## 2018-09-22 NOTE — Progress Notes (Signed)
Daily Session Note  Patient Details  Name: [name] Foster MRN: [id number] Date of Birth: 1956-02-25 Referring Provider:     CARDIAC REHAB PHASE II ORIENTATION from 08/28/2018 in [place]  Referring Provider  Branch       Encounter Date: 09/22/2018  Check In: Session Check In - 09/22/18 0930      Check-In   Supervising physician immediately available to respond to emergencies  See telemetry face sheet for immediately available MD    Location  [facility]    Staff Present  Benay Pike, Exercise Physiologist;Diane Coad, MS, EP, [facility], Exercise Physiologist;Miabella Shannahan Wynetta Emery, RN, BSN    Medication changes reported      No    Fall or balance concerns reported     No    Warm-up and Cool-down  Performed as group-led instruction    Resistance Training Performed  Yes    VAD Patient?  No    PAD/SET Patient?  No      Pain Assessment   Currently in Pain?  No/denies    Pain Score  0-No pain    Multiple Pain Sites  No       Capillary Blood Glucose: No results found for this or any previous visit (from the past 24 hour(s)).    Social History   Tobacco Use  Smoking Status Former Smoker  . Packs/day: 2.00  . Years: 30.00  . Pack years: 60.00  . Last attempt to quit: 03/25/2017  . Years since quitting: 1.4  Smokeless Tobacco Never Used    Goals Met:  Independence with exercise equipment Exercise tolerated well No report of cardiac concerns or symptoms Strength training completed today  Goals Unmet:  Not Applicable  Comments: Pt able to follow exercise prescription today without complaint.  Will continue to monitor for progression. Check out 1030.   Dr. Kate Sable is Medical Director for [facility] Cardiac and Pulmonary Rehab.

## 2018-09-25 ENCOUNTER — Encounter (HOSPITAL_COMMUNITY): Payer: 59

## 2018-09-27 ENCOUNTER — Encounter (HOSPITAL_COMMUNITY)
Admission: RE | Admit: 2018-09-27 | Discharge: 2018-09-27 | Disposition: A | Discharge status: Home / Self Care | Payer: 59 | Source: Ambulatory Visit | Attending: Cardiovascular Disease | Admitting: Cardiovascular Disease

## 2018-09-27 DIAGNOSIS — I214 Non-ST elevation (NSTEMI) myocardial infarction: Secondary | ICD-10-CM

## 2018-09-27 DIAGNOSIS — Z955 Presence of coronary angioplasty implant and graft: Secondary | ICD-10-CM

## 2018-09-27 NOTE — Progress Notes (Signed)
Daily Session Note  Patient Details  Name: [name] Foster MRN: [id number] Date of Birth: 06/06/1956 Referring Provider:     CARDIAC REHAB PHASE II ORIENTATION from 08/28/2018 in [place] CARDIAC REHABILITATION  Referring Provider  Branch       Encounter Date: 09/27/2018  Check In: Session Check In - 09/27/18 0930      Check-In   Supervising physician immediately available to respond to emergencies  See telemetry face sheet for immediately available MD    Location  [facility]    Staff Present  Amanda Ballard, Exercise Physiologist;Debra Johnson, RN, BSN;Other    Medication changes reported      No    Fall or balance concerns reported     No    Warm-up and Cool-down  Performed as group-led instruction    Resistance Training Performed  Yes    VAD Patient?  No    PAD/SET Patient?  No      Pain Assessment   Currently in Pain?  No/denies    Pain Score  0-No pain    Multiple Pain Sites  No       Capillary Blood Glucose: No results found for this or any previous visit (from the past 24 hour(s)).    Social History   Tobacco Use  Smoking Status Former Smoker  . Packs/day: 2.00  . Years: 30.00  . Pack years: 60.00  . Last attempt to quit: 03/25/2017  . Years since quitting: 1.5  Smokeless Tobacco Never Used    Goals Met:  Independence with exercise equipment Exercise tolerated well No report of cardiac concerns or symptoms Strength training completed today  Goals Unmet:  Not Applicable  Comments: Pt able to follow exercise prescription today without complaint.  Will continue to monitor for progression. Check out 1030.   Dr. Suresh Koneswaran is Medical Director for [facility] Cardiac and Pulmonary Rehab. 

## 2018-09-29 ENCOUNTER — Encounter (HOSPITAL_COMMUNITY): Payer: 59

## 2018-09-29 ENCOUNTER — Other Ambulatory Visit: Payer: 59 | Admitting: *Deleted

## 2018-09-29 ENCOUNTER — Encounter: Payer: 59 | Admitting: *Deleted

## 2018-09-29 VITALS — BP 125/63 | HR 54 | Resp 16 | Wt 198.6 lb

## 2018-09-29 DIAGNOSIS — Z79899 Other long term (current) drug therapy: Secondary | ICD-10-CM

## 2018-09-29 DIAGNOSIS — Z006 Encounter for examination for normal comparison and control in clinical research program: Secondary | ICD-10-CM

## 2018-09-29 LAB — LIPID PANEL
CHOL/HDL RATIO: 2.6 ratio (ref 0.0–5.0)
CHOLESTEROL TOTAL: 118 mg/dL (ref 100–199)
HDL: 45 mg/dL (ref >39)
LDL CALC: 55 mg/dL (ref 0–99)
TRIGLYCERIDES: 90 mg/dL (ref 0–149)
VLDL CHOLESTEROL CAL: 18 mg/dL (ref 5–40)

## 2018-09-29 LAB — HEPATIC FUNCTION PANEL
ALBUMIN: 4.2 g/dL (ref 3.6–4.8)
ALT: 21 IU/L (ref 0–44)
AST: 18 IU/L (ref 0–40)
Alkaline Phosphatase: 57 IU/L (ref 39–117)
BILIRUBIN, DIRECT: 0.1 mg/dL (ref 0.00–0.40)
Bilirubin Total: 0.3 mg/dL (ref 0.0–1.2)
Total Protein: 6.3 g/dL (ref 6.0–8.5)

## 2018-09-29 NOTE — Research (Signed)
[facility] Visit 8  Pt doing well, no complaints of cp. He says once in a while he has some SOB, more so when he lays down but will repositioning it gets better. He said his weight is up a little. No swelling in his ankles or feet. He has notice some bloating in his stomach some times. I told him to keep an eye on it and it gets worse to please let us know. He is scheduled to see Dr Copper in March for follow up. He is participating in cardiac rehab. No changes in his meds. Will see pt back in April for follow up.                                    "CONSENT"   YES     NO   Continuing further Investigational Product and study visits for follow-up? [x]  []   Continuing consent from future biomedical research [x]  []                                   "EVENTS"    YES     NO  AE   (IF YES SEE SOURCE) []  [x]   SAE  (IF YES SEE SOURCE) []  [x]   ENDPOINT   (IF YES SEE SOURCE) []  [x]   REVASCULARIZATION  (IF YES SEE SOURCE) []  [x]   AMPUTATION   (IF YES SEE SOURCE) []  [x]   TROPONIN'S  (IF YES SEE SOURCE) []  [x]    Lifestyle Adherence Assessment:    YES NO  Abstinence from smoking/remaining tobacco free X   Cardiac Diet X   Routine physical activity and/or cardiac rehabilitation X    [id number]  MOBILITY:    I HAVE NO PROBLEMS WALKING [x]   I HAVE SLIGHT PROBLEMS WALKING []   I HAVE MODERATE PROBLEMS WALKING []   I HAVE SEVERE PROBLEMS WALKING []   I AM UNABLE TO WALK  []     SELF-CARE:   I HAVE NO PROBLEMS WASING OR DRESSING MYSELF  [x]   I HAVE SLIGHT PROBLEMS WASHING OR DRESSING MYSELF  []   I HAVE MODERATE PROBLEMS WASHING OR DRESSING MYSELF []   I HAVE SEVERE PROBLEMS WASHING OR DRESSING MYSELF  []   I HAVE SEVERE PROBLEMS WASHING OR DRESSING MYSELF  []   I AM UNABLE TO WASH OR DRESS MYSELF []     USUAL ACTIVITIES: (E.G. WORK/STUDY/HOUSEWORK/FAMILY OR LEISURE ACTIVITIES.    I HAVE NO PROBLEMS DOING MY USUAL ACTIVITIES [x]   I HAVE SLIGHT PROBLEMS DOING MY USUAL ACTIVITIES []   I HAVE MODERATE PROBLEMS DOING  MY USUAL ACTIVIITIES []   I HAVE SEVERE PROBLEMS DOING MY USUAL ACTIVITIES []   I AM UNABLE TO DO MY USUAL ACTIVITIES []     PAIN /DISCOMFORT   I HAVE NO PAIN OR DISCOMFORT [x]   I HAVE SLIGHT PAIN OR DISCOMFORT []   I HAVE MODERATE PAIN OR DISCOMFORT []   I HAVE SEVERE PAIN OR DISCOMFORT []   I HAVE EXTREME PAIN OR DISCOMFORT []     ANXIETY/DEPRESSION   I AM NOT ANXIOUS OR DEPRESSED [x]   I AM SLIGHTLY ANXIOUS OR DEPRESSED []   I AM MODERATELY ANXIOUS OR DREPRESSED []   I AM SEVERELY ANXIOUS OR DEPRESSED []   I AM EXTREMELY ANXIOUS OR DEPRESSED []     SCALE OF 0-100 HOW WOULD YOU RATE TODAY?  0 IS THE WORSE AND 100 IS THE BEST HEALTH YOU CAN IMAGINE: 85  Current Outpatient Medications:  .  amLODipine (NORVASC) 5 MG tablet, TAKE 1 TABLET BY MOUTH EVERY DAY, Disp: 30 tablet, Rfl: 10 .  atorvastatin (LIPITOR) 40 MG tablet, Take 1 tablet (40 mg total) by mouth daily., Disp: 90 tablet, Rfl: 3 .  clopidogrel (PLAVIX) 75 MG tablet, Take 1 tablet (75 mg total) by mouth daily., Disp: 30 tablet, Rfl: 11 .  [employer]S ASPIRIN LOW STRENGTH 81 MG EC tablet, TAKE 1 TABLET BY MOUTH EVERY DAY, Disp: 90 tablet, Rfl: 3 .  hydrALAZINE (APRESOLINE) 50 MG tablet, TAKE 1 TABLET (50 MG TOTAL) BY MOUTH EVERY 8 (EIGHT) HOURS., Disp: 270 tablet, Rfl: 3 .  HYDROcodone-acetaminophen (NORCO) 7.5-325 MG tablet, Take 1 tablet by mouth every 6 (six) hours as needed for moderate pain., Disp: , Rfl:  .  isosorbide mononitrate (IMDUR) 30 MG 24 hr tablet, Take 0.5 tablets (15 mg total) by mouth daily., Disp: 45 tablet, Rfl: 2 .  losartan (COZAAR) 100 MG tablet, Take 1 tablet (100 mg total) by mouth daily., Disp: 30 tablet, Rfl: 11 .  metFORMIN (GLUCOPHAGE) 1000 MG tablet, Take 500 mg by mouth 2 (two) times daily with a meal., Disp: , Rfl: 6 .  metoprolol tartrate (LOPRESSOR) 50 MG tablet, Take 1 tablet (50 mg total) by mouth 2 (two) times daily., Disp: 180 tablet, Rfl: 3 .  nitroGLYCERIN (NITROSTAT) 0.4 MG SL tablet, Place 1 tablet  (0.4 mg total) under the tongue every 5 (five) minutes x 3 doses as needed for chest pain., Disp: 25 tablet, Rfl: 11 .  zolpidem (AMBIEN) 10 MG tablet, Take 10 mg by mouth at bedtime as needed for sleep. , Disp: , Rfl:

## 2018-10-02 ENCOUNTER — Encounter (HOSPITAL_COMMUNITY)
Admission: RE | Admit: 2018-10-02 | Discharge: 2018-10-02 | Disposition: A | Discharge status: Home / Self Care | Payer: 59 | Source: Ambulatory Visit | Attending: Cardiovascular Disease | Admitting: Cardiovascular Disease

## 2018-10-02 DIAGNOSIS — Z955 Presence of coronary angioplasty implant and graft: Secondary | ICD-10-CM

## 2018-10-02 DIAGNOSIS — I214 Non-ST elevation (NSTEMI) myocardial infarction: Secondary | ICD-10-CM | POA: Diagnosis not present

## 2018-10-02 NOTE — Progress Notes (Signed)
Daily Session Note  Patient Details  Name: [name] Foster MRN: [id number] Date of Birth: [birth year] Referring Provider:     CARDIAC REHAB PHASE II ORIENTATION from 08/28/2018 in [place]  Referring Provider  Branch       Encounter Date: 10/02/2018  Check In: Session Check In - 10/02/18 0930      Check-In   Supervising physician immediately available to respond to emergencies  See telemetry face sheet for immediately available MD    Location  [facility]    Staff Present  Benay Pike, Exercise Physiologist;Danaija Eskridge Wynetta Emery, RN, BSN;Diane Coad, MS, EP, [facility], Exercise Physiologist    Medication changes reported      No    Fall or balance concerns reported     No    Warm-up and Cool-down  Performed as group-led instruction    Resistance Training Performed  Yes    VAD Patient?  No    PAD/SET Patient?  No      Pain Assessment   Currently in Pain?  No/denies    Pain Score  0-No pain    Multiple Pain Sites  No       Capillary Blood Glucose: No results found for this or any previous visit (from the past 24 hour(s)).    Social History   Tobacco Use  Smoking Status Former Smoker  . Packs/day: 2.00  . Years: 30.00  . Pack years: 60.00  . Last attempt to quit: 03/25/2017  . Years since quitting: 1.5  Smokeless Tobacco Never Used    Goals Met:  Independence with exercise equipment Exercise tolerated well No report of cardiac concerns or symptoms Strength training completed today  Goals Unmet:  Not Applicable  Comments: Pt able to follow exercise prescription today without complaint.  Will continue to monitor for progression. Check out 1030.   Dr. Kate Sable is Medical Director for [facility] Cardiac and Pulmonary Rehab.

## 2018-10-03 ENCOUNTER — Telehealth: Payer: Self-pay | Admitting: *Deleted

## 2018-10-03 NOTE — Telephone Encounter (Signed)
Pt returned my call and he has been made aware of his lab results. See result note. 

## 2018-10-03 NOTE — Telephone Encounter (Signed)
-----   Message from [place], [place] sent at 10/03/2018  1:31 PM EST ----- Cholesterol is at recommended goal and liver enzymes are normal. Continue current dose of Lipitor 40 mg nightly. No recommended changes at this time.

## 2018-10-03 NOTE — Telephone Encounter (Signed)
-----   Message from [address] [place], [place] sent at 10/03/2018  1:31 PM EST ----- Cholesterol is at recommended goal and liver enzymes are normal. Continue current dose of Lipitor 40 mg nightly. No recommended changes at this time.

## 2018-10-03 NOTE — Telephone Encounter (Signed)
Called pt re: lab results, left a message for him to call back  

## 2018-10-04 ENCOUNTER — Encounter (HOSPITAL_COMMUNITY)
Admission: RE | Admit: 2018-10-04 | Discharge: 2018-10-04 | Disposition: A | Discharge status: Home / Self Care | Payer: 59 | Source: Ambulatory Visit | Attending: Cardiovascular Disease | Admitting: Cardiovascular Disease

## 2018-10-04 DIAGNOSIS — I214 Non-ST elevation (NSTEMI) myocardial infarction: Secondary | ICD-10-CM | POA: Diagnosis not present

## 2018-10-04 DIAGNOSIS — Z955 Presence of coronary angioplasty implant and graft: Secondary | ICD-10-CM

## 2018-10-04 NOTE — Progress Notes (Signed)
Daily Session Note  Patient Details  Name: YUNIOR JAIN MRN: [id number] Date of Birth: 1956/01/28 Referring Provider:     CARDIAC REHAB PHASE II ORIENTATION from 08/28/2018 in [place]  Referring Provider  Branch       Encounter Date: 10/04/2018  Check In: Session Check In - 10/04/18 0930      Check-In   Supervising physician immediately available to respond to emergencies  See telemetry face sheet for immediately available MD    Location  [facility]    Staff Present  Benay Pike, Exercise Physiologist;Kamali Sakata Wynetta Emery, RN, BSN;Diane Coad, MS, EP, [facility], Exercise Physiologist    Medication changes reported      No    Fall or balance concerns reported     No    Warm-up and Cool-down  Performed as group-led instruction    Resistance Training Performed  Yes    VAD Patient?  No    PAD/SET Patient?  No      Pain Assessment   Currently in Pain?  No/denies    Pain Score  0-No pain    Multiple Pain Sites  No       Capillary Blood Glucose: No results found for this or any previous visit (from the past 24 hour(s)).    Social History   Tobacco Use  Smoking Status Former Smoker  . Packs/day: 2.00  . Years: 30.00  . Pack years: 60.00  . Last attempt to quit: 03/25/2017  . Years since quitting: 1.5  Smokeless Tobacco Never Used    Goals Met:  Independence with exercise equipment Exercise tolerated well No report of cardiac concerns or symptoms Strength training completed today  Goals Unmet:  Not Applicable  Comments: Pt able to follow exercise prescription today without complaint.  Will continue to monitor for progression. Check out 1030.   Dr. Kate Sable is Medical Director for [facility] Cardiac and Pulmonary Rehab.

## 2018-10-06 ENCOUNTER — Encounter (HOSPITAL_COMMUNITY)
Admission: RE | Admit: 2018-10-06 | Discharge: 2018-10-06 | Disposition: A | Discharge status: Home / Self Care | Payer: 59 | Source: Ambulatory Visit | Attending: Cardiovascular Disease | Admitting: Cardiovascular Disease

## 2018-10-06 DIAGNOSIS — I214 Non-ST elevation (NSTEMI) myocardial infarction: Secondary | ICD-10-CM | POA: Diagnosis not present

## 2018-10-06 DIAGNOSIS — Z955 Presence of coronary angioplasty implant and graft: Secondary | ICD-10-CM

## 2018-10-06 NOTE — Progress Notes (Signed)
Daily Session Note  Patient Details  Name: [name] Foster MRN: [id number] Date of Birth: [birth year] Referring Provider:     CARDIAC REHAB PHASE II ORIENTATION from 08/28/2018 in [place]  Referring Provider  Branch       Encounter Date: 10/06/2018  Check In: Session Check In - 10/06/18 0930      Check-In   Supervising physician immediately available to respond to emergencies  See telemetry face sheet for immediately available MD    Location  [facility]    Staff Present  Benay Pike, Exercise Physiologist;Debra Wynetta Emery, RN, BSN;Diane Coad, MS, EP, [facility], Exercise Physiologist    Medication changes reported      No    Fall or balance concerns reported     No    Warm-up and Cool-down  Performed as group-led instruction    Resistance Training Performed  Yes    VAD Patient?  No    PAD/SET Patient?  No      Pain Assessment   Currently in Pain?  No/denies    Pain Score  0-No pain    Multiple Pain Sites  No       Capillary Blood Glucose: No results found for this or any previous visit (from the past 24 hour(s)).    Social History   Tobacco Use  Smoking Status Former Smoker  . Packs/day: 2.00  . Years: 30.00  . Pack years: 60.00  . Last attempt to quit: 03/25/2017  . Years since quitting: 1.5  Smokeless Tobacco Never Used    Goals Met:  Independence with exercise equipment Exercise tolerated well No report of cardiac concerns or symptoms Strength training completed today  Goals Unmet:  Not Applicable  Comments: Pt able to follow exercise prescription today without complaint.  Will continue to monitor for progression. Check out 1030.   Dr. Kate Sable is Medical Director for [facility] Cardiac and Pulmonary Rehab.

## 2018-10-09 ENCOUNTER — Encounter (HOSPITAL_COMMUNITY): Payer: 59

## 2018-10-11 ENCOUNTER — Encounter (HOSPITAL_COMMUNITY)
Admission: RE | Admit: 2018-10-11 | Discharge: 2018-10-11 | Disposition: A | Discharge status: Home / Self Care | Payer: 59 | Source: Ambulatory Visit | Attending: Cardiovascular Disease | Admitting: Cardiovascular Disease

## 2018-10-11 DIAGNOSIS — I214 Non-ST elevation (NSTEMI) myocardial infarction: Secondary | ICD-10-CM | POA: Diagnosis not present

## 2018-10-11 DIAGNOSIS — Z955 Presence of coronary angioplasty implant and graft: Secondary | ICD-10-CM

## 2018-10-11 NOTE — Progress Notes (Signed)
Daily Session Note  Patient Details  Name: ZOEY GILKESON MRN: [id number] Date of Birth: 1956/03/23 Referring Provider:     CARDIAC REHAB PHASE II ORIENTATION from 08/28/2018 in [place]  Referring Provider  Branch       Encounter Date: 10/11/2018  Check In: Session Check In - 10/11/18 0930      Check-In   Supervising physician immediately available to respond to emergencies  See telemetry face sheet for immediately available MD    Location  [facility]    Staff Present  Benay Pike, Exercise Physiologist;Greely Atiyeh Wynetta Emery, RN, BSN;Diane Coad, MS, EP, [facility], Exercise Physiologist    Medication changes reported      No    Fall or balance concerns reported     No    Warm-up and Cool-down  Performed as group-led instruction    Resistance Training Performed  Yes    VAD Patient?  No    PAD/SET Patient?  No      Pain Assessment   Currently in Pain?  No/denies    Pain Score  0-No pain    Multiple Pain Sites  No       Capillary Blood Glucose: No results found for this or any previous visit (from the past 24 hour(s)).    Social History   Tobacco Use  Smoking Status Former Smoker  . Packs/day: 2.00  . Years: 30.00  . Pack years: 60.00  . Last attempt to quit: 03/25/2017  . Years since quitting: 1.5  Smokeless Tobacco Never Used    Goals Met:  Independence with exercise equipment Exercise tolerated well No report of cardiac concerns or symptoms Strength training completed today  Goals Unmet:  Not Applicable  Comments: Pt able to follow exercise prescription today without complaint.  Will continue to monitor for progression. Check out 1030.   Dr. Kate Sable is Medical Director for [facility] Cardiac and Pulmonary Rehab.

## 2018-10-13 ENCOUNTER — Encounter (HOSPITAL_COMMUNITY): Payer: 59

## 2018-10-16 ENCOUNTER — Encounter (HOSPITAL_COMMUNITY)
Admission: RE | Admit: 2018-10-16 | Discharge: 2018-10-16 | Disposition: A | Discharge status: Home / Self Care | Payer: 59 | Source: Ambulatory Visit | Attending: Cardiovascular Disease | Admitting: Cardiovascular Disease

## 2018-10-16 DIAGNOSIS — I214 Non-ST elevation (NSTEMI) myocardial infarction: Secondary | ICD-10-CM | POA: Diagnosis present

## 2018-10-16 DIAGNOSIS — Z955 Presence of coronary angioplasty implant and graft: Secondary | ICD-10-CM | POA: Diagnosis present

## 2018-10-16 NOTE — Progress Notes (Signed)
Daily Session Note  Patient Details  Name: [name] Foster MRN: [id number] Date of Birth: 09-11-1956 Referring Provider:     CARDIAC REHAB PHASE II ORIENTATION from 08/28/2018 in [place]  Referring Provider  Branch       Encounter Date: 10/16/2018  Check In: Session Check In - 10/16/18 0930      Check-In   Supervising physician immediately available to respond to emergencies  See telemetry face sheet for immediately available MD    Location  [facility]    Staff Present  Benay Pike, Exercise Physiologist;Syla Devoss Wynetta Emery, RN, BSN;Diane Coad, MS, EP, [facility], Exercise Physiologist    Medication changes reported      No    Fall or balance concerns reported     No    Warm-up and Cool-down  Performed as group-led instruction    Resistance Training Performed  Yes    VAD Patient?  No    PAD/SET Patient?  No      Pain Assessment   Currently in Pain?  No/denies    Pain Score  0-No pain    Multiple Pain Sites  No       Capillary Blood Glucose: No results found for this or any previous visit (from the past 24 hour(s)).    Social History   Tobacco Use  Smoking Status Former Smoker  . Packs/day: 2.00  . Years: 30.00  . Pack years: 60.00  . Last attempt to quit: 03/25/2017  . Years since quitting: 1.5  Smokeless Tobacco Never Used    Goals Met:  Independence with exercise equipment Exercise tolerated well No report of cardiac concerns or symptoms Strength training completed today  Goals Unmet:  Not Applicable  Comments: Pt able to follow exercise prescription today without complaint.  Will continue to monitor for progression. Check out 1030.   Dr. Kate Sable is Medical Director for [facility] Cardiac and Pulmonary Rehab.

## 2018-10-18 ENCOUNTER — Encounter (HOSPITAL_COMMUNITY): Payer: 59

## 2018-10-18 NOTE — Progress Notes (Signed)
Cardiac Individual Treatment Plan  Patient Details  Name: [name] Foster MRN: [id number] Date of Birth: [birth year] Referring Provider:     CARDIAC REHAB PHASE II ORIENTATION from 08/28/2018 in [place]  Referring Provider  Branch       Initial Encounter Date:    CARDIAC REHAB PHASE II ORIENTATION from 08/28/2018 in [place]  Date  08/28/18      Visit Diagnosis: NSTEMI (non-ST elevated myocardial infarction) [facility])  Status post coronary artery stent placement  Patient's Home Medications on Admission:  Current Outpatient Medications:  .  amLODipine (NORVASC) 5 MG tablet, TAKE 1 TABLET BY MOUTH EVERY DAY, Disp: 30 tablet, Rfl: 10 .  atorvastatin (LIPITOR) 40 MG tablet, Take 1 tablet (40 mg total) by mouth daily., Disp: 90 tablet, Rfl: 3 .  clopidogrel (PLAVIX) 75 MG tablet, Take 1 tablet (75 mg total) by mouth daily., Disp: 30 tablet, Rfl: 11 .  [employer] ASPIRIN LOW STRENGTH 81 MG EC tablet, TAKE 1 TABLET BY MOUTH EVERY DAY, Disp: 90 tablet, Rfl: 3 .  hydrALAZINE (APRESOLINE) 50 MG tablet, TAKE 1 TABLET (50 MG TOTAL) BY MOUTH EVERY 8 (EIGHT) HOURS., Disp: 270 tablet, Rfl: 3 .  HYDROcodone-acetaminophen (NORCO) 7.5-325 MG tablet, Take 1 tablet by mouth every 6 (six) hours as needed for moderate pain., Disp: , Rfl:  .  isosorbide mononitrate (IMDUR) 30 MG 24 hr tablet, Take 0.5 tablets (15 mg total) by mouth daily., Disp: 45 tablet, Rfl: 2 .  losartan (COZAAR) 100 MG tablet, Take 1 tablet (100 mg total) by mouth daily., Disp: 30 tablet, Rfl: 11 .  metFORMIN (GLUCOPHAGE) 1000 MG tablet, Take 500 mg by mouth 2 (two) times daily with a meal., Disp: , Rfl: 6 .  metoprolol tartrate (LOPRESSOR) 50 MG tablet, Take 1 tablet (50 mg total) by mouth 2 (two) times daily., Disp: 180 tablet, Rfl: 3 .  nitroGLYCERIN (NITROSTAT) 0.4 MG SL tablet, Place 1 tablet (0.4 mg total) under the tongue every 5 (five) minutes x 3 doses as needed for chest pain., Disp:  25 tablet, Rfl: 11 .  zolpidem (AMBIEN) 10 MG tablet, Take 10 mg by mouth at bedtime as needed for sleep. , Disp: , Rfl:   Past Medical History: Past Medical History:  Diagnosis Date  . CAD in native artery    a. 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 DES to left PDA 09/20/17  . Diabetes mellitus without complication ([place])   . High cholesterol   . Hyperlipidemia   . Hypertension   . Insomnia   . Pulmonary hypertension ([place])    a. TTE 03/25/17; EF 60-65%, nl WM, LV diastolic fxn nl, trivial AI, RV cavity size, wall thickness, and sys fxn nl, trivial TR, PASP 39 mmHg, trivial pericardial effusion  . Sleep apnea    could not use a cpap  . Wears dentures    full top-partial bottom  . Wears glasses     Tobacco Use: Social History   Tobacco Use  Smoking Status Former Smoker  . Packs/day: 2.00  . Years: 30.00  . Pack years: 60.00  . Last attempt to quit: 03/25/2017  . Years since quitting: 1.5  Smokeless Tobacco Never Used    Labs: Recent Review Scientist, physiological    Labs for ITP Cardiac and Pulmonary Rehab Latest Ref Rng & Units 03/25/2017 05/13/2017  07/01/2018 08/24/2018 09/29/2018   Cholestrol 100 - 199 mg/dL 163 81(L) 151 111 118   LDLCALC 0 - 99 mg/dL 92 41 81 51 55   HDL >39 mg/dL 25(L) 24(L) 34(L) 45 45   Trlycerides 0 - 149 mg/dL 229(H) 82 180(H) 73 90   Hemoglobin A1c 4.8 - 5.6 % 9.4(H) - - - -   PHART 7.350 - 7.450 7.206(L) - - - -   PCO2ART 32.0 - 48.0 mmHg 53.8(H) - - - -   HCO3 20.0 - 28.0 mmol/L 21.3 - - - -   TCO2 0 - 100 mmol/L 23 - - - -   ACIDBASEDEF 0.0 - 2.0 mmol/L 7.0(H) - - - -   O2SAT % 99.0 - - - -      Capillary Blood Glucose: Lab Results  Component Value Date   GLUCAP 120 (H) 07/04/2018   GLUCAP 113 (H) 07/04/2018   GLUCAP 170 (H) 07/03/2018   GLUCAP 98 07/03/2018   GLUCAP 102 (H) 07/03/2018     Exercise Target  Goals: Exercise Program Goal: Individual exercise prescription set using results from initial 6 min walk test and THRR while considering  patient's activity barriers and safety.   Exercise Prescription Goal: Starting with aerobic activity 30 plus minutes a day, 3 days per week for initial exercise prescription. Provide home exercise prescription and guidelines that participant acknowledges understanding prior to discharge.  Activity Barriers & Risk Stratification: Activity Barriers & Cardiac Risk Stratification - 08/28/18 1021      Activity Barriers & Cardiac Risk Stratification   Activity Barriers  None    Cardiac Risk Stratification  High       6 Minute Walk: 6 Minute Walk    Row Name 08/28/18 1020         6 Minute Walk   Phase  Initial     Distance  1600 feet     Walk Time  6 minutes     # of Rest Breaks  0     MPH  3.03     METS  3.32     RPE  11     Perceived Dyspnea   8     VO2 Peak  12.47     Symptoms  No     Resting HR  59 bpm     Resting BP  120/64     Resting Oxygen Saturation   95 %     Exercise Oxygen Saturation  during 6 min walk  97 %     Max Ex. HR  76 bpm     Max Ex. BP  138/64     2 Minute Post BP  126/64        Oxygen Initial Assessment:   Oxygen Re-Evaluation:   Oxygen Discharge (Final Oxygen Re-Evaluation):   Initial Exercise Prescription: Initial Exercise Prescription - 08/28/18 1000      Date of Initial Exercise RX and Referring Provider   Date  08/28/18    Referring Provider  Branch     Expected Discharge Date  11/27/18      Treadmill   MPH  1.6    Grade  0    Minutes  17    METs  2.22      Recumbant Elliptical   Level  1    RPM  51    Watts  55    Minutes  22    METs  3.6      Prescription Details  Frequency (times per week)  3    Duration  Progress to 30 minutes of continuous aerobic without signs/symptoms of physical distress      Intensity   THRR 40-80% of Max Heartrate  [phone]    Ratings of Perceived  Exertion  11-13    Perceived Dyspnea  0-4      Progression   Progression  Continue progressive overload as per policy without signs/symptoms or physical distress.      Resistance Training   Training Prescription  Yes    Weight  3    Reps  10-15       Perform Capillary Blood Glucose checks as needed.  Exercise Prescription Changes:  Exercise Prescription Changes    Row Name 08/28/18 1000 09/12/18 1200 09/26/18 1300 10/10/18 0800       Response to Exercise   Blood Pressure (Admit)  120/64  118/68  108/60  118/60    Blood Pressure (Exercise)  138/64  108/70  120/70  130/70    Blood Pressure (Exit)  126/64  112/60  110/62  112/60    Heart Rate (Admit)  59 bpm  60 bpm  74 bpm  63 bpm    Heart Rate (Exercise)  76 bpm  87 bpm  109 bpm  106 bpm    Heart Rate (Exit)  63 bpm  70 bpm  71 bpm  71 bpm    Oxygen Saturation (Admit)  95 %  -  -  -    Oxygen Saturation (Exercise)  97 %  -  -  -    Oxygen Saturation (Exit)  96 %  -  -  -    Rating of Perceived Exertion (Exercise)  _0 Perceived Dyspnea (Exercise)  8  -  -  -    Comments  6 minute walk test  fist two weeks of exercise   -  first two weeks of exercise     Duration  Progress to 30 minutes of  aerobic without signs/symptoms of physical distress  Progress to 30 minutes of  aerobic without signs/symptoms of physical distress  Continue with 30 min of aerobic exercise without signs/symptoms of physical distress.  Continue with 30 min of aerobic exercise without signs/symptoms of physical distress.    Intensity  THRR New [phone]  THRR unchanged  THRR unchanged  THRR unchanged      Progression   Progression  -  Continue to progress workloads to maintain intensity without signs/symptoms of physical distress.  Continue to progress workloads to maintain intensity without signs/symptoms of physical distress.  Continue to progress workloads to maintain intensity without signs/symptoms of physical distress.    Average METs  -   4.1  4.65  4.09      Resistance Training   Training Prescription  -  Yes  Yes  Yes    Weight  -  _1 Reps  -  10-15  10-15  10-15      Treadmill   MPH  -  1.8  2.1  2.6    Grade  -  0  0  0    Minutes  -  _2 METs  -  2.37  2.6  2.98      Recumbant Elliptical   Level  -  _3 RPM  -  74  75  [place]  _0 METs  -  5.8  6.7  5.2      Home Exercise Plan   Plans to continue exercise at  -  Home (comment)  Home (comment)  Home (comment)    Frequency  -  Add 2 additional days to program exercise sessions.  Add 2 additional days to program exercise sessions.  Add 2 additional days to program exercise sessions.    Initial Home Exercises Provided  -  08/28/18  08/28/18  08/28/18       Exercise Comments:  Exercise Comments    Row Name 09/19/18 1009 10/16/18 1447         Exercise Comments  Pt. has done well in CR so far. He has attended 6 exercise sessions, working each time to increase his distance on both machines as well as his overall MET level. Once he is able to return to attending regularly he will see much progress.   Pt. has attended 14 exercise sessions so far. He has tolerated them all with ease and is eager to be able to get back to the levels he was using before his event.          Exercise Goals and Review:  Exercise Goals    Row Name 08/28/18 1023             Exercise Goals   Increase Physical Activity  Yes       Intervention  Provide advice, education, support and counseling about physical activity/exercise needs.;Develop an individualized exercise prescription for aerobic and resistive training based on initial evaluation findings, risk stratification, comorbidities and participant's personal goals.       Expected Outcomes  Short Term: Attend rehab on a regular basis to increase amount of physical activity.       Increase Strength and Stamina  Yes       Intervention  Provide advice,  education, support and counseling about physical activity/exercise needs.;Develop an individualized exercise prescription for aerobic and resistive training based on initial evaluation findings, risk stratification, comorbidities and participant's personal goals.       Expected Outcomes  Short Term: Increase workloads from initial exercise prescription for resistance, speed, and METs.;Long Term: Improve cardiorespiratory fitness, muscular endurance and strength as measured by increased METs and functional capacity (6MWT);Short Term: Perform resistance training exercises routinely during rehab and add in resistance training at home       Able to understand and use rate of perceived exertion (RPE) scale  Yes       Intervention  Provide education and explanation on how to use RPE scale       Expected Outcomes  Short Term: Able to use RPE daily in rehab to express subjective intensity level;Long Term:  Able to use RPE to guide intensity level when exercising independently       Able to understand and use Dyspnea scale  Yes       Intervention  Provide education and explanation on how to use Dyspnea scale       Expected Outcomes  Short Term: Able to use Dyspnea scale daily in rehab to express subjective sense of shortness of breath during exertion;Long Term: Able to use Dyspnea scale to guide intensity level when exercising independently       Knowledge and understanding of Target Heart Rate Range (THRR)  Yes  Intervention  Provide education and explanation of THRR including how the numbers were predicted and where they are located for reference       Expected Outcomes  Short Term: Able to state/look up THRR       Able to check pulse independently  Yes       Intervention  Provide education and demonstration on how to check pulse in carotid and radial arteries.;Review the importance of being able to check your own pulse for safety during independent exercise       Expected Outcomes  Short Term: Able to  explain why pulse checking is important during independent exercise;Long Term: Able to check pulse independently and accurately       Understanding of Exercise Prescription  Yes       Intervention  Provide education, explanation, and written materials on patient's individual exercise prescription       Expected Outcomes  Short Term: Able to explain program exercise prescription;Long Term: Able to explain home exercise prescription to exercise independently          Exercise Goals Re-Evaluation : Exercise Goals Re-Evaluation    Row Name 09/19/18 1007 10/16/18 1446           Exercise Goal Re-Evaluation   Exercise Goals Review  Increase Physical Activity;Increase Strength and Stamina;Able to understand and use rate of perceived exertion (RPE) scale;Knowledge and understanding of Target Heart Rate Range (THRR);Able to check pulse independently;Understanding of Exercise Prescription  Increase Physical Activity;Increase Strength and Stamina;Able to understand and use rate of perceived exertion (RPE) scale;Knowledge and understanding of Target Heart Rate Range (THRR);Able to check pulse independently;Understanding of Exercise Prescription      Comments  Pt. has attended 6 exercise sessions so far. He is eager to get back in to the swing of things and increase his strength and stamina. He has missed a few days due to family issues but works hard when he is here. We will continue to monitor and progress as we see fit.   Pt. continues to progess well with his exercise. He is up to 2.8 MPH on the TM. He feels like he is getting stronger and is ready to be back to the workloads he was using prior to his heart event.       Expected Outcomes  to lose weight, his goal is 25 pounds   to lose weight, his goal is 25 pounds           Discharge Exercise Prescription (Final Exercise Prescription Changes): Exercise Prescription Changes - 10/10/18 0800      Response to Exercise   Blood Pressure (Admit)  118/60     Blood Pressure (Exercise)  130/70    Blood Pressure (Exit)  112/60    Heart Rate (Admit)  63 bpm    Heart Rate (Exercise)  106 bpm    Heart Rate (Exit)  71 bpm    Rating of Perceived Exertion (Exercise)  12    Comments  first two weeks of exercise     Duration  Continue with 30 min of aerobic exercise without signs/symptoms of physical distress.    Intensity  THRR unchanged      Progression   Progression  Continue to progress workloads to maintain intensity without signs/symptoms of physical distress.    Average METs  4.09      Resistance Training   Training Prescription  Yes    Weight  4    Reps  10-15  Treadmill   MPH  2.6    Grade  0    Minutes  17    METs  2.98      Recumbant Elliptical   Level  2    RPM  72    Watts  112    Minutes  22    METs  5.2      Home Exercise Plan   Plans to continue exercise at  Home (comment)    Frequency  Add 2 additional days to program exercise sessions.    Initial Home Exercises Provided  08/28/18       Nutrition:  Target Goals: Understanding of nutrition guidelines, daily intake of sodium <1526m, cholesterol <2042m calories 30% from fat and 7% or less from saturated fats, daily to have 5 or more servings of fruits and vegetables.  Biometrics: Pre Biometrics - 08/28/18 1024      Pre Biometrics   Height  _0  (1.753 m)    Waist Circumference  40.5 inches    Hip Circumference  36 inches    Waist to Hip Ratio  1.12 %    Triceps Skinfold  6 mm    % Body Fat  24.3 %    Grip Strength  13.2 kg    Flexibility  14.5 in    Single Leg Stand  37.89 seconds        Nutrition Therapy Plan and Nutrition Goals: Nutrition Therapy & Goals - 10/18/18 1311      Nutrition Therapy   RD appointment deferred  Yes      Personal Nutrition Goals   Comments  Patient has not meet with RD. This is his 2nd time in the program and he meet with him the 1st time. He says he has not changed his diet since starting the program. He follows a  diabetic, low NA, low fat diet. Will continue to monitor.       Intervention Plan   Intervention  Nutrition handout(s) given to patient.       Nutrition Assessments: Nutrition Assessments - 08/28/18 1137      MEDFICTS Scores   Pre Score  18       Nutrition Goals Re-Evaluation:   Nutrition Goals Discharge (Final Nutrition Goals Re-Evaluation):   Psychosocial: Target Goals: Acknowledge presence or absence of significant depression and/or stress, maximize coping skills, provide positive support system. Participant is able to verbalize types and ability to use techniques and skills needed for reducing stress and depression.  Initial Review & Psychosocial Screening: Initial Psych Review & Screening - 08/28/18 1136      Initial Review   Current issues with  None Identified      Family Dynamics   Good Support System?  Yes      Barriers   Psychosocial barriers to participate in program  There are no identifiable barriers or psychosocial needs.      Screening Interventions   Interventions  Encouraged to exercise    Expected Outcomes  Short Term goal: Identification and review with participant of any Quality of Life or Depression concerns found by scoring the questionnaire.;Long Term goal: The participant improves quality of Life and PHQ9 Scores as seen by post scores and/or verbalization of changes       Quality of Life Scores: Quality of Life - 08/28/18 1025      Quality of Life   Select  Quality of Life      Quality of Life Scores   Health/Function Pre  19.63 %  Socioeconomic Pre  23.43 %    Psych/Spiritual Pre  21.79 %    Family Pre  27.6 %    GLOBAL Pre  22.03 %      Scores of 19 and below usually indicate a poorer quality of life in these areas.  A difference of  2-3 points is a clinically meaningful difference.  A difference of 2-3 points in the total score of the Quality of Life Index has been associated with significant improvement in overall quality of life,  self-image, physical symptoms, and general health in studies assessing change in quality of life.  PHQ-9: Recent Review Flowsheet Data    Depression screen [facility] 2/9 08/28/2018 02/27/2018 10/25/2017 04/26/2017   Decreased Interest 0 0 0 0   Down, Depressed, Hopeless 0 0 0 0   PHQ - 2 Score 0 0 0 0   Altered sleeping _0 -   Tired, decreased energy _1 -   Change in appetite 1 1 0 -   Feeling bad or failure about yourself  0 0 0 -   Trouble concentrating 0 0 0 -   Moving slowly or fidgety/restless 0 0 0 -   Suicidal thoughts 0 0 0 -   PHQ-9 Score _2 -   Difficult doing work/chores Not difficult at all Not difficult at all Not difficult at all -     Interpretation of Total Score  Total Score Depression Severity:  1-4 = Minimal depression, 5-9 = Mild depression, 10-14 = Moderate depression, 15-19 = Moderately severe depression, 20-27 = Severe depression   Psychosocial Evaluation and Intervention: Psychosocial Evaluation - 08/28/18 1136      Psychosocial Evaluation & Interventions   Interventions  Encouraged to exercise with the program and follow exercise prescription    Continue Psychosocial Services   No Follow up required       Psychosocial Re-Evaluation: Psychosocial Re-Evaluation    Row Name 09/21/18 0758 10/18/18 1315           Psychosocial Re-Evaluation   Current issues with  None Identified  None Identified      Comments  Patient's initial QOL score was 22.03 and his PHQ-9 score was with no psychosocial issues identified.   Patient's initial QOL score was 22.81 and his PHQ-9 score was 2 with no psychosocial issues identified.       Expected Outcomes  Patient will have no psychosocial issues identified at discharge.   Patient will have no psychosocial issues identified at discharge.       Interventions  Stress management education;Encouraged to attend Cardiac Rehabilitation for the exercise;Relaxation education  Stress management education;Encouraged to attend Cardiac  Rehabilitation for the exercise;Relaxation education      Continue Psychosocial Services   No Follow up required  No Follow up required         Psychosocial Discharge (Final Psychosocial Re-Evaluation): Psychosocial Re-Evaluation - 10/18/18 1315      Psychosocial Re-Evaluation   Current issues with  None Identified    Comments  Patient's initial QOL score was 22.81 and his PHQ-9 score was 2 with no psychosocial issues identified.     Expected Outcomes  Patient will have no psychosocial issues identified at discharge.     Interventions  Stress management education;Encouraged to attend Cardiac Rehabilitation for the exercise;Relaxation education    Continue Psychosocial Services   No Follow up required       Vocational Rehabilitation: Provide vocational rehab assistance to qualifying candidates.  Vocational Rehab Evaluation & Intervention: Vocational Rehab - 08/28/18 1138      Initial Vocational Rehab Evaluation & Intervention   Assessment shows need for Vocational Rehabilitation  No       Education: Education Goals: Education classes will be provided on a weekly basis, covering required topics. Participant will state understanding/return demonstration of topics presented.  Learning Barriers/Preferences: Learning Barriers/Preferences - 08/28/18 1138      Learning Barriers/Preferences   Learning Barriers  None    Learning Preferences  Skilled Demonstration;Group Instruction;Individual Instruction       Education Topics: Hypertension, Hypertension Reduction -Define heart disease and high blood pressure. Discus how high blood pressure affects the body and ways to reduce high blood pressure.   Exercise and Your Heart -Discuss why it is important to exercise, the FITT principles of exercise, normal and abnormal responses to exercise, and how to exercise safely.   CARDIAC REHAB PHASE II EXERCISE from 02/15/2018 in [place]  Date  01/25/18  Educator  DC   Instruction Review Code  2- Demonstrated Understanding      Angina -Discuss definition of angina, causes of angina, treatment of angina, and how to decrease risk of having angina.   CARDIAC REHAB PHASE II EXERCISE from 02/15/2018 in [place]  Date  11/02/17  Educator  DJ  Instruction Review Code  2- Demonstrated Understanding      Cardiac Medications -Review what the following cardiac medications are used for, how they affect the body, and side effects that may occur when taking the medications.  Medications include Aspirin, Beta blockers, calcium channel blockers, ACE Inhibitors, angiotensin receptor blockers, diuretics, digoxin, and antihyperlipidemics.   CARDIAC REHAB PHASE II EXERCISE from 02/15/2018 in [place]  Date  11/09/17  Educator  DC  Instruction Review Code  2- Demonstrated Understanding      Congestive Heart Failure -Discuss the definition of CHF, how to live with CHF, the signs and symptoms of CHF, and how keep track of weight and sodium intake.   CARDIAC REHAB PHASE II EXERCISE from 02/15/2018 in [place]  Date  11/16/17  Educator  DC  Instruction Review Code  2- Demonstrated Understanding      Heart Disease and Intimacy -Discus the effect sexual activity has on the heart, how changes occur during intimacy as we age, and safety during sexual activity.   Smoking Cessation / COPD -Discuss different methods to quit smoking, the health benefits of quitting smoking, and the definition of COPD.   CARDIAC REHAB PHASE II EXERCISE from 02/15/2018 in [place]  Date  12/01/17  Educator  DC  Instruction Review Code  2- Demonstrated Understanding      Nutrition I: Fats -Discuss the types of cholesterol, what cholesterol does to the heart, and how cholesterol levels can be controlled.   CARDIAC REHAB PHASE II EXERCISE from 02/15/2018 in [place]  Date   12/07/17  Educator  DC  Instruction Review Code  2- Demonstrated Understanding      Nutrition II: Labels -Discuss the different components of food labels and how to read food label   [place] from 10/11/2018 in [place]  Date  09/15/18  Educator  Coad  Instruction Review Code  2- Demonstrated Understanding      Heart Parts/Heart Disease and PAD -Discuss the anatomy of the heart, the pathway of blood circulation through the heart, and these are affected  by heart disease.   CARDIAC REHAB PHASE II EXERCISE from 10/11/2018 in [place]  Date  09/20/18  Educator  Wynetta Emery  Instruction Review Code  2- Demonstrated Understanding      Stress I: Signs and Symptoms -Discuss the causes of stress, how stress may lead to anxiety and depression, and ways to limit stress.   CARDIAC REHAB PHASE II EXERCISE from 10/11/2018 in [place]  Date  09/27/18  Educator  Wynetta Emery  Instruction Review Code  2- Demonstrated Understanding      Stress II: Relaxation -Discuss different types of relaxation techniques to limit stress.   CARDIAC REHAB PHASE II EXERCISE from 10/11/2018 in [place]  Date  10/04/18  Educator  Coad  Instruction Review Code  2- Demonstrated Understanding      Warning Signs of Stroke / TIA -Discuss definition of a stroke, what the signs and symptoms are of a stroke, and how to identify when someone is having stroke.   CARDIAC REHAB PHASE II EXERCISE from 10/11/2018 in [place]  Date  10/11/18  Educator  Coad  Instruction Review Code  2- Demonstrated Understanding      Knowledge Questionnaire Score: Knowledge Questionnaire Score - 08/28/18 1138      Knowledge Questionnaire Score   Pre Score  23/24       Core Components/Risk Factors/Patient Goals at Admission: Personal Goals and Risk Factors at Admission - 08/28/18 1138       Core Components/Risk Factors/Patient Goals on Admission    Weight Management  Yes    Admit Weight  195 lb 9.6 oz (88.7 kg)    Goal Weight: Short Term  185 lb 9.6 oz (84.2 kg)    Goal Weight: Long Term  175 lb 9.6 oz (79.7 kg)    Expected Outcomes  Short Term: Continue to assess and modify interventions until short term weight is achieved;Long Term: Adherence to nutrition and physical activity/exercise program aimed toward attainment of established weight goal    Personal Goal Other  Yes    Personal Goal  Lose 25lbs, regain heat health    Intervention  Attend CR 3 x week and suopplement with at home exercise 2 x week.    Expected Outcomes  Reach personal goals       Core Components/Risk Factors/Patient Goals Review:  Goals and Risk Factor Review    Row Name 09/21/18 0754 10/18/18 1312           Core Components/Risk Factors/Patient Goals Review   Personal Goals Review  Weight Management/Obesity;Diabetes Get healthier; lose 20 lbs.   Weight Management/Obesity;Diabetes Regain health; lose 25 lbs.       Review  Patient has completed 7 sessions maintaining his weigth since his initial visit. He is doing well in the program with progression. He recently completed the program and was in our forever fit maintenance program but had another event. He says he is getting in better physical shape and feels like he is getting his heart healthy again. He blood pressure is better controlled and his reported fasting glucose readings are averaging 130. He has no recent A1C's on file. Will continue to monitor for progress.   Patient has completed 14 sessions maintaining his weight since last 30 day review. He continues to do well in the program with progression. He says he feels like he is getting his heart back in heart healthy shape and feels his stamina is improving. His last A1C was 6  months ago at 6.2. His blood pressue is controlled. Will continue to monitor for progress.       Expected Outcomes  Patient  will continue to attend sessions and complete the program meeting his personal goals.   Patient will continue to attend sessions and complete the program meeting his personal goals.          Core Components/Risk Factors/Patient Goals at Discharge (Final Review):  Goals and Risk Factor Review - 10/18/18 1312      Core Components/Risk Factors/Patient Goals Review   Personal Goals Review  Weight Management/Obesity;Diabetes   Regain health; lose 25 lbs.    Review  Patient has completed 14 sessions maintaining his weight since last 30 day review. He continues to do well in the program with progression. He says he feels like he is getting his heart back in heart healthy shape and feels his stamina is improving. His last A1C was 6 months ago at 6.2. His blood pressue is controlled. Will continue to monitor for progress.     Expected Outcomes  Patient will continue to attend sessions and complete the program meeting his personal goals.        ITP Comments: ITP Comments    Row Name 08/28/18 1115           ITP Comments  [name] Foster finished our program, joined maintenance. He has recently had another episode and is now coming back through the program. He is eager to get started.           Comments: ITP REVIEW Patient is doing well in the program. Will continue to monitor for progress.

## 2018-10-20 ENCOUNTER — Encounter (HOSPITAL_COMMUNITY): Payer: 59

## 2018-10-23 ENCOUNTER — Encounter (HOSPITAL_COMMUNITY)
Admission: RE | Admit: 2018-10-23 | Discharge: 2018-10-23 | Disposition: A | Discharge status: Home / Self Care | Payer: 59 | Source: Ambulatory Visit | Attending: Cardiovascular Disease | Admitting: Cardiovascular Disease

## 2018-10-23 DIAGNOSIS — Z955 Presence of coronary angioplasty implant and graft: Secondary | ICD-10-CM

## 2018-10-23 DIAGNOSIS — I214 Non-ST elevation (NSTEMI) myocardial infarction: Secondary | ICD-10-CM | POA: Diagnosis not present

## 2018-10-23 NOTE — Progress Notes (Signed)
Daily Session Note  Patient Details  Name: [name] Foster MRN: [id number] Date of Birth: [birth year] Referring Provider:     CARDIAC REHAB PHASE II ORIENTATION from 08/28/2018 in [place]  Referring Provider  Branch       Encounter Date: 10/23/2018  Check In: Session Check In - 10/23/18 0930      Check-In   Supervising physician immediately available to respond to emergencies  See telemetry face sheet for immediately available MD    Location  [facility]    Staff Present  Benay Pike, Exercise Physiologist;Diane Coad, MS, EP, [facility], Exercise Physiologist    Medication changes reported      No    Fall or balance concerns reported     No    Warm-up and Cool-down  Performed as group-led instruction    Resistance Training Performed  Yes    VAD Patient?  No    PAD/SET Patient?  No      Pain Assessment   Currently in Pain?  No/denies    Pain Score  0-No pain    Multiple Pain Sites  No       Capillary Blood Glucose: No results found for this or any previous visit (from the past 24 hour(s)).    Social History   Tobacco Use  Smoking Status Former Smoker  . Packs/day: 2.00  . Years: 30.00  . Pack years: 60.00  . Last attempt to quit: 03/25/2017  . Years since quitting: 1.5  Smokeless Tobacco Never Used    Goals Met:  Proper associated with RPD/PD & O2 Sat Independence with exercise equipment Exercise tolerated well No report of cardiac concerns or symptoms Strength training completed today  Goals Unmet:  Not Applicable  Comments: Pt able to follow exercise prescription today without complaint.  Will continue to monitor for progression. Check out 1030.   Dr. Kate Sable is Medical Director for [facility] Cardiac and Pulmonary Rehab.

## 2018-10-25 ENCOUNTER — Encounter (HOSPITAL_COMMUNITY): Payer: 59

## 2018-10-27 ENCOUNTER — Encounter (HOSPITAL_COMMUNITY): Payer: 59

## 2018-10-30 ENCOUNTER — Encounter (HOSPITAL_COMMUNITY): Payer: 59

## 2018-11-01 ENCOUNTER — Encounter (HOSPITAL_COMMUNITY): Payer: 59

## 2018-11-03 ENCOUNTER — Encounter (HOSPITAL_COMMUNITY): Payer: 59

## 2018-11-06 ENCOUNTER — Encounter (HOSPITAL_COMMUNITY)
Admission: RE | Admit: 2018-11-06 | Discharge: 2018-11-06 | Disposition: A | Discharge status: Home / Self Care | Payer: 59 | Source: Ambulatory Visit | Attending: Cardiovascular Disease | Admitting: Cardiovascular Disease

## 2018-11-06 DIAGNOSIS — I214 Non-ST elevation (NSTEMI) myocardial infarction: Secondary | ICD-10-CM | POA: Diagnosis not present

## 2018-11-06 DIAGNOSIS — Z955 Presence of coronary angioplasty implant and graft: Secondary | ICD-10-CM

## 2018-11-06 NOTE — Progress Notes (Signed)
Daily Session Note  Patient Details  Name: [name] Foster MRN: [id number] Date of Birth: May 16, 1956 Referring Provider:     CARDIAC REHAB PHASE II ORIENTATION from 08/28/2018 in [place]  Referring Provider  Branch       Encounter Date: 11/06/2018  Check In: Session Check In - 11/06/18 0930      Check-In   Supervising physician immediately available to respond to emergencies  See telemetry face sheet for immediately available MD    Location  [facility]    Staff Present  Benay Pike, Exercise Physiologist;Diane Coad, MS, EP, [facility], Exercise Physiologist;Debra Wynetta Emery, RN, BSN    Medication changes reported      No    Fall or balance concerns reported     No    Warm-up and Cool-down  Performed as group-led instruction    Resistance Training Performed  Yes    VAD Patient?  No    PAD/SET Patient?  No      Pain Assessment   Currently in Pain?  No/denies    Pain Score  0-No pain    Multiple Pain Sites  No       Capillary Blood Glucose: No results found for this or any previous visit (from the past 24 hour(s)).    Social History   Tobacco Use  Smoking Status Former Smoker  . Packs/day: 2.00  . Years: 30.00  . Pack years: 60.00  . Last attempt to quit: 03/25/2017  . Years since quitting: 1.6  Smokeless Tobacco Never Used    Goals Met:  Proper associated with RPD/PD & O2 Sat Independence with exercise equipment Exercise tolerated well No report of cardiac concerns or symptoms Strength training completed today  Goals Unmet:  Not Applicable  Comments: Pt able to follow exercise prescription today without complaint.  Will continue to monitor for progression. Check out 1030.   Dr. Kate Sable is Medical Director for [facility] Cardiac and Pulmonary Rehab.

## 2018-11-08 ENCOUNTER — Encounter (HOSPITAL_COMMUNITY)
Admission: RE | Admit: 2018-11-08 | Discharge: 2018-11-08 | Disposition: A | Discharge status: Home / Self Care | Payer: 59 | Source: Ambulatory Visit | Attending: Cardiovascular Disease | Admitting: Cardiovascular Disease

## 2018-11-08 DIAGNOSIS — I214 Non-ST elevation (NSTEMI) myocardial infarction: Secondary | ICD-10-CM

## 2018-11-08 DIAGNOSIS — Z955 Presence of coronary angioplasty implant and graft: Secondary | ICD-10-CM

## 2018-11-08 NOTE — Progress Notes (Signed)
Daily Session Note  Patient Details  Name: [name] Foster MRN: [id number] Date of Birth: Oct 10, 1955 Referring Provider:     CARDIAC REHAB PHASE II ORIENTATION from 08/28/2018 in [place]  Referring Provider  Branch       Encounter Date: 11/08/2018  Check In: Session Check In - 11/08/18 0930      Check-In   Supervising physician immediately available to respond to emergencies  See telemetry face sheet for immediately available MD    Location  [facility]    Staff Present  Benay Pike, Exercise Physiologist;Debra Wynetta Emery, RN, BSN;Diane Coad, MS, EP, [facility], Exercise Physiologist    Medication changes reported      No    Fall or balance concerns reported     No    Warm-up and Cool-down  Performed as group-led instruction    Resistance Training Performed  Yes    VAD Patient?  No    PAD/SET Patient?  No      Pain Assessment   Currently in Pain?  No/denies    Pain Score  0-No pain    Multiple Pain Sites  No       Capillary Blood Glucose: No results found for this or any previous visit (from the past 24 hour(s)).    Social History   Tobacco Use  Smoking Status Former Smoker  . Packs/day: 2.00  . Years: 30.00  . Pack years: 60.00  . Last attempt to quit: 03/25/2017  . Years since quitting: 1.6  Smokeless Tobacco Never Used    Goals Met:  Proper associated with RPD/PD & O2 Sat Independence with exercise equipment Exercise tolerated well No report of cardiac concerns or symptoms Strength training completed today  Goals Unmet:  Not Applicable  Comments: Pt able to follow exercise prescription today without complaint.  Will continue to monitor for progression. Check out 1030.   Dr. Kate Sable is Medical Director for [facility] Cardiac and Pulmonary Rehab.

## 2018-11-10 ENCOUNTER — Encounter (HOSPITAL_COMMUNITY): Payer: 59

## 2018-11-13 ENCOUNTER — Encounter (HOSPITAL_COMMUNITY)
Admission: RE | Admit: 2018-11-13 | Discharge: 2018-11-13 | Disposition: A | Discharge status: Home / Self Care | Payer: 59 | Source: Ambulatory Visit | Attending: Cardiovascular Disease | Admitting: Cardiovascular Disease

## 2018-11-13 DIAGNOSIS — I214 Non-ST elevation (NSTEMI) myocardial infarction: Secondary | ICD-10-CM | POA: Insufficient documentation

## 2018-11-13 DIAGNOSIS — Z955 Presence of coronary angioplasty implant and graft: Secondary | ICD-10-CM | POA: Insufficient documentation

## 2018-11-13 NOTE — Progress Notes (Signed)
Daily Session Note  Patient Details  Name: [name] Foster MRN: [id number] Date of Birth: Apr 05, 1956 Referring Provider:     CARDIAC REHAB PHASE II ORIENTATION from 08/28/2018 in [place]  Referring Provider  Branch       Encounter Date: 11/13/2018  Check In: Session Check In - 11/13/18 0930      Check-In   Supervising physician immediately available to respond to emergencies  See telemetry face sheet for immediately available MD    Location  [facility]    Staff Present  Benay Pike, Exercise Physiologist;Debra Wynetta Emery, RN, BSN;Diane Coad, MS, EP, [facility], Exercise Physiologist    Medication changes reported      No    Fall or balance concerns reported     No    Warm-up and Cool-down  Performed as group-led instruction    Resistance Training Performed  Yes    VAD Patient?  No    PAD/SET Patient?  No      Pain Assessment   Currently in Pain?  No/denies    Pain Score  0-No pain    Multiple Pain Sites  No       Capillary Blood Glucose: No results found for this or any previous visit (from the past 24 hour(s)).    Social History   Tobacco Use  Smoking Status Former Smoker  . Packs/day: 2.00  . Years: 30.00  . Pack years: 60.00  . Last attempt to quit: 03/25/2017  . Years since quitting: 1.6  Smokeless Tobacco Never Used    Goals Met:  Proper associated with RPD/PD & O2 Sat Independence with exercise equipment Exercise tolerated well No report of cardiac concerns or symptoms Strength training completed today  Goals Unmet:  Not Applicable  Comments: Pt able to follow exercise prescription today without complaint.  Will continue to monitor for progression. Check out 1030.   Dr. Kate Sable is Medical Director for [facility] Cardiac and Pulmonary Rehab.

## 2018-11-15 ENCOUNTER — Encounter (HOSPITAL_COMMUNITY)
Admission: RE | Admit: 2018-11-15 | Discharge: 2018-11-15 | Disposition: A | Discharge status: Home / Self Care | Payer: 59 | Source: Ambulatory Visit | Attending: Cardiovascular Disease | Admitting: Cardiovascular Disease

## 2018-11-15 DIAGNOSIS — Z955 Presence of coronary angioplasty implant and graft: Secondary | ICD-10-CM

## 2018-11-15 DIAGNOSIS — I214 Non-ST elevation (NSTEMI) myocardial infarction: Secondary | ICD-10-CM | POA: Diagnosis not present

## 2018-11-15 NOTE — Progress Notes (Signed)
Daily Session Note  Patient Details  Name: GODFREY TRITSCHLER MRN: [id number] Date of Birth: Jan 26, 1956 Referring Provider:     CARDIAC REHAB PHASE II ORIENTATION from 08/28/2018 in [place]  Referring Provider  Branch       Encounter Date: 11/15/2018  Check In: Session Check In - 11/15/18 0930      Check-In   Supervising physician immediately available to respond to emergencies  See telemetry face sheet for immediately available MD    Location  [facility]    Staff Present  Benay Pike, Exercise Physiologist;Debra Wynetta Emery, RN, BSN;Diane Coad, MS, EP, [facility], Exercise Physiologist    Medication changes reported      No    Fall or balance concerns reported     No    Warm-up and Cool-down  Performed as group-led instruction    Resistance Training Performed  Yes    VAD Patient?  No    PAD/SET Patient?  No      Pain Assessment   Currently in Pain?  No/denies    Pain Score  0-No pain    Multiple Pain Sites  No       Capillary Blood Glucose: No results found for this or any previous visit (from the past 24 hour(s)).    Social History   Tobacco Use  Smoking Status Former Smoker  . Packs/day: 2.00  . Years: 30.00  . Pack years: 60.00  . Last attempt to quit: 03/25/2017  . Years since quitting: 1.6  Smokeless Tobacco Never Used    Goals Met:  Proper associated with RPD/PD & O2 Sat Independence with exercise equipment Exercise tolerated well No report of cardiac concerns or symptoms Strength training completed today  Goals Unmet:  Not Applicable  Comments: Pt able to follow exercise prescription today without complaint.  Will continue to monitor for progression. Check out 1030.   Dr. Kate Sable is Medical Director for [facility] Cardiac and Pulmonary Rehab.

## 2018-11-15 NOTE — Progress Notes (Signed)
Cardiac Individual Treatment Plan  Patient Details  Name: [name] Foster MRN: [id number] Date of Birth: [birth year] Referring Provider:     CARDIAC REHAB PHASE II ORIENTATION from 08/28/2018 in [place]  Referring Provider  Branch       Initial Encounter Date:    CARDIAC REHAB PHASE II ORIENTATION from 08/28/2018 in [place]  Date  08/28/18      Visit Diagnosis: NSTEMI (non-ST elevated myocardial infarction) [facility])  Status post coronary artery stent placement  Patient's Home Medications on Admission:  Current Outpatient Medications:  .  amLODipine (NORVASC) 5 MG tablet, TAKE 1 TABLET BY MOUTH EVERY DAY, Disp: 30 tablet, Rfl: 10 .  atorvastatin (LIPITOR) 40 MG tablet, Take 1 tablet (40 mg total) by mouth daily., Disp: 90 tablet, Rfl: 3 .  clopidogrel (PLAVIX) 75 MG tablet, Take 1 tablet (75 mg total) by mouth daily., Disp: 30 tablet, Rfl: 11 .  [employer] ASPIRIN LOW STRENGTH 81 MG EC tablet, TAKE 1 TABLET BY MOUTH EVERY DAY, Disp: 90 tablet, Rfl: 3 .  hydrALAZINE (APRESOLINE) 50 MG tablet, TAKE 1 TABLET (50 MG TOTAL) BY MOUTH EVERY 8 (EIGHT) HOURS., Disp: 270 tablet, Rfl: 3 .  HYDROcodone-acetaminophen (NORCO) 7.5-325 MG tablet, Take 1 tablet by mouth every 6 (six) hours as needed for moderate pain., Disp: , Rfl:  .  isosorbide mononitrate (IMDUR) 30 MG 24 hr tablet, Take 0.5 tablets (15 mg total) by mouth daily., Disp: 45 tablet, Rfl: 2 .  losartan (COZAAR) 100 MG tablet, Take 1 tablet (100 mg total) by mouth daily., Disp: 30 tablet, Rfl: 11 .  metFORMIN (GLUCOPHAGE) 1000 MG tablet, Take 500 mg by mouth 2 (two) times daily with a meal., Disp: , Rfl: 6 .  metoprolol tartrate (LOPRESSOR) 50 MG tablet, Take 1 tablet (50 mg total) by mouth 2 (two) times daily., Disp: 180 tablet, Rfl: 3 .  nitroGLYCERIN (NITROSTAT) 0.4 MG SL tablet, Place 1 tablet (0.4 mg total) under the tongue every 5 (five) minutes x 3 doses as needed for chest pain., Disp:  25 tablet, Rfl: 11 .  zolpidem (AMBIEN) 10 MG tablet, Take 10 mg by mouth at bedtime as needed for sleep. , Disp: , Rfl:   Past Medical History: Past Medical History:  Diagnosis Date  . CAD in native artery    a. 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 DES to left PDA 09/20/17  . Diabetes mellitus without complication ([place])   . High cholesterol   . Hyperlipidemia   . Hypertension   . Insomnia   . Pulmonary hypertension ([place])    a. TTE 03/25/17; EF 60-65%, nl WM, LV diastolic fxn nl, trivial AI, RV cavity size, wall thickness, and sys fxn nl, trivial TR, PASP 39 mmHg, trivial pericardial effusion  . Sleep apnea    could not use a cpap  . Wears dentures    full top-partial bottom  . Wears glasses     Tobacco Use: Social History   Tobacco Use  Smoking Status Former Smoker  . Packs/day: 2.00  . Years: 30.00  . Pack years: 60.00  . Last attempt to quit: 03/25/2017  . Years since quitting: 1.6  Smokeless Tobacco Never Used    Labs: Recent Review Scientist, physiological    Labs for ITP Cardiac and Pulmonary Rehab Latest Ref Rng & Units 03/25/2017 05/13/2017  07/01/2018 08/24/2018 09/29/2018   Cholestrol 100 - 199 mg/dL 163 81(L) 151 111 118   LDLCALC 0 - 99 mg/dL 92 41 81 51 55   HDL >39 mg/dL 25(L) 24(L) 34(L) 45 45   Trlycerides 0 - 149 mg/dL 229(H) 82 180(H) 73 90   Hemoglobin A1c 4.8 - 5.6 % 9.4(H) - - - -   PHART 7.350 - 7.450 7.206(L) - - - -   PCO2ART 32.0 - 48.0 mmHg 53.8(H) - - - -   HCO3 20.0 - 28.0 mmol/L 21.3 - - - -   TCO2 0 - 100 mmol/L 23 - - - -   ACIDBASEDEF 0.0 - 2.0 mmol/L 7.0(H) - - - -   O2SAT % 99.0 - - - -      Capillary Blood Glucose: Lab Results  Component Value Date   GLUCAP 120 (H) 07/04/2018   GLUCAP 113 (H) 07/04/2018   GLUCAP 170 (H) 07/03/2018   GLUCAP 98 07/03/2018   GLUCAP 102 (H) 07/03/2018     Exercise Target  Goals: Exercise Program Goal: Individual exercise prescription set using results from initial 6 min walk test and THRR while considering  patient's activity barriers and safety.   Exercise Prescription Goal: Starting with aerobic activity 30 plus minutes a day, 3 days per week for initial exercise prescription. Provide home exercise prescription and guidelines that participant acknowledges understanding prior to discharge.  Activity Barriers & Risk Stratification: Activity Barriers & Cardiac Risk Stratification - 08/28/18 1021      Activity Barriers & Cardiac Risk Stratification   Activity Barriers  None    Cardiac Risk Stratification  High       6 Minute Walk: 6 Minute Walk    Row Name 08/28/18 1020         6 Minute Walk   Phase  Initial     Distance  1600 feet     Walk Time  6 minutes     # of Rest Breaks  0     MPH  3.03     METS  3.32     RPE  11     Perceived Dyspnea   8     VO2 Peak  12.47     Symptoms  No     Resting HR  59 bpm     Resting BP  120/64     Resting Oxygen Saturation   95 %     Exercise Oxygen Saturation  during 6 min walk  97 %     Max Ex. HR  76 bpm     Max Ex. BP  138/64     2 Minute Post BP  126/64        Oxygen Initial Assessment:   Oxygen Re-Evaluation:   Oxygen Discharge (Final Oxygen Re-Evaluation):   Initial Exercise Prescription: Initial Exercise Prescription - 08/28/18 1000      Date of Initial Exercise RX and Referring Provider   Date  08/28/18    Referring Provider  Branch     Expected Discharge Date  11/27/18      Treadmill   MPH  1.6    Grade  0    Minutes  17    METs  2.22      Recumbant Elliptical   Level  1    RPM  51    Watts  55    Minutes  22    METs  3.6      Prescription Details  Frequency (times per week)  3    Duration  Progress to 30 minutes of continuous aerobic without signs/symptoms of physical distress      Intensity   THRR 40-80% of Max Heartrate  [phone]    Ratings of Perceived  Exertion  11-13    Perceived Dyspnea  0-4      Progression   Progression  Continue progressive overload as per policy without signs/symptoms or physical distress.      Resistance Training   Training Prescription  Yes    Weight  3    Reps  10-15       Perform Capillary Blood Glucose checks as needed.  Exercise Prescription Changes:  Exercise Prescription Changes    Row Name 08/28/18 1000 09/12/18 1200 09/26/18 1300 10/10/18 0800 10/26/18 1200     Response to Exercise   Blood Pressure (Admit)  120/64  118/68  108/60  118/60  150/62   Blood Pressure (Exercise)  138/64  108/70  120/70  130/70  160/60   Blood Pressure (Exit)  126/64  112/60  110/62  112/60  136/70   Heart Rate (Admit)  59 bpm  60 bpm  74 bpm  63 bpm  63 bpm   Heart Rate (Exercise)  76 bpm  87 bpm  109 bpm  106 bpm  116 bpm   Heart Rate (Exit)  63 bpm  70 bpm  71 bpm  71 bpm  75 bpm   Oxygen Saturation (Admit)  95 %  -  -  -  -   Oxygen Saturation (Exercise)  97 %  -  -  -  -   Oxygen Saturation (Exit)  96 %  -  -  -  -   Rating of Perceived Exertion (Exercise)  _0 Perceived Dyspnea (Exercise)  8  -  -  -  -   Comments  6 minute walk test  fist two weeks of exercise   -  first two weeks of exercise   increase in overall MET level    Duration  Progress to 30 minutes of  aerobic without signs/symptoms of physical distress  Progress to 30 minutes of  aerobic without signs/symptoms of physical distress  Continue with 30 min of aerobic exercise without signs/symptoms of physical distress.  Continue with 30 min of aerobic exercise without signs/symptoms of physical distress.  Continue with 30 min of aerobic exercise without signs/symptoms of physical distress.   Intensity  THRR New [phone]  THRR unchanged  THRR unchanged  THRR unchanged  THRR unchanged     Progression   Progression  -  Continue to progress workloads to maintain intensity without signs/symptoms of physical distress.  Continue to progress  workloads to maintain intensity without signs/symptoms of physical distress.  Continue to progress workloads to maintain intensity without signs/symptoms of physical distress.  Continue to progress workloads to maintain intensity without signs/symptoms of physical distress.   Average METs  -  4.1  4.65  4.09  4.87     Resistance Training   Training Prescription  -  Yes  Yes  Yes  Yes   Weight  -  _1 Reps  -  10-15  10-15  10-15  10-15     Treadmill   MPH  -  1.8  2.1  2.6  2.8   Grade  -  0  0  0  0   Minutes  -  _0 METs  -  2.37  2.6  2.98  3.14     Recumbant Elliptical   Level  -  _1 RPM  -  74  75  72  72   Watts  -  117  113  112  118   Minutes  -  _2 METs  -  5.8  6.7  5.2  6.6     Home Exercise Plan   Plans to continue exercise at  -  Home (comment)  Home (comment)  Home (comment)  Home (comment)   Frequency  -  Add 2 additional days to program exercise sessions.  Add 2 additional days to program exercise sessions.  Add 2 additional days to program exercise sessions.  Add 2 additional days to program exercise sessions.   Initial Home Exercises Provided  -  08/28/18  08/28/18  08/28/18  08/28/18   Row Name 11/07/18 0800             Response to Exercise   Blood Pressure (Admit)  140/60       Blood Pressure (Exercise)  140/62       Blood Pressure (Exit)  120/74       Heart Rate (Admit)  58 bpm       Heart Rate (Exercise)  83 bpm       Heart Rate (Exit)  81 bpm       Rating of Perceived Exertion (Exercise)  12       Comments  returned after 2 weeks out due to a death in the family.        Duration  Continue with 30 min of aerobic exercise without signs/symptoms of physical distress.       Intensity  THRR unchanged         Progression   Progression  Continue to progress workloads to maintain intensity without signs/symptoms of physical distress.       Average METs  4.82         Resistance Training   Training Prescription   Yes       Weight  4       Reps  10-15         Treadmill   MPH  2.8       Grade  0       Minutes  17       METs  3.14         Recumbant Elliptical   Level  2       RPM  85       Watts  112       Minutes  22       METs  6.5         Home Exercise Plan   Plans to continue exercise at  Home (comment)       Frequency  Add 2 additional days to program exercise sessions.       Initial Home Exercises Provided  08/28/18          Exercise Comments:  Exercise Comments    Row Name 09/19/18 1009 10/16/18 1447 10/26/18 1247 11/13/18 1443     Exercise Comments  Pt. has done well in CR so far. He has attended 6 exercise sessions, working each time to increase his distance on  both machines as well as his overall MET level. Once he is able to return to attending regularly he will see much progress.   Pt. has attended 14 exercise sessions so far. He has tolerated them all with ease and is eager to be able to get back to the levels he was using before his event.   Pt. has attended 15 sessions. His attendance has been spotty due to sickness in the family.   Pt. has attended 18 sessions so far. He is now back more regularly after having to struggle with a death in his family. He is tolerating all of the exercise well. He is now walking at 3.0 MPH on the TM. We will continue to monitor and progress as we see fit.        Exercise Goals and Review:  Exercise Goals    Row Name 08/28/18 1023             Exercise Goals   Increase Physical Activity  Yes       Intervention  Provide advice, education, support and counseling about physical activity/exercise needs.;Develop an individualized exercise prescription for aerobic and resistive training based on initial evaluation findings, risk stratification, comorbidities and participant's personal goals.       Expected Outcomes  Short Term: Attend rehab on a regular basis to increase amount of physical activity.       Increase Strength and Stamina  Yes        Intervention  Provide advice, education, support and counseling about physical activity/exercise needs.;Develop an individualized exercise prescription for aerobic and resistive training based on initial evaluation findings, risk stratification, comorbidities and participant's personal goals.       Expected Outcomes  Short Term: Increase workloads from initial exercise prescription for resistance, speed, and METs.;Long Term: Improve cardiorespiratory fitness, muscular endurance and strength as measured by increased METs and functional capacity (6MWT);Short Term: Perform resistance training exercises routinely during rehab and add in resistance training at home       Able to understand and use rate of perceived exertion (RPE) scale  Yes       Intervention  Provide education and explanation on how to use RPE scale       Expected Outcomes  Short Term: Able to use RPE daily in rehab to express subjective intensity level;Long Term:  Able to use RPE to guide intensity level when exercising independently       Able to understand and use Dyspnea scale  Yes       Intervention  Provide education and explanation on how to use Dyspnea scale       Expected Outcomes  Short Term: Able to use Dyspnea scale daily in rehab to express subjective sense of shortness of breath during exertion;Long Term: Able to use Dyspnea scale to guide intensity level when exercising independently       Knowledge and understanding of Target Heart Rate Range (THRR)  Yes       Intervention  Provide education and explanation of THRR including how the numbers were predicted and where they are located for reference       Expected Outcomes  Short Term: Able to state/look up THRR       Able to check pulse independently  Yes       Intervention  Provide education and demonstration on how to check pulse in carotid and radial arteries.;Review the importance of being able to check your own pulse for safety during independent exercise  Expected  Outcomes  Short Term: Able to explain why pulse checking is important during independent exercise;Long Term: Able to check pulse independently and accurately       Understanding of Exercise Prescription  Yes       Intervention  Provide education, explanation, and written materials on patient's individual exercise prescription       Expected Outcomes  Short Term: Able to explain program exercise prescription;Long Term: Able to explain home exercise prescription to exercise independently          Exercise Goals Re-Evaluation : Exercise Goals Re-Evaluation    Row Name 09/19/18 1007 10/16/18 1446 11/13/18 1442         Exercise Goal Re-Evaluation   Exercise Goals Review  Increase Physical Activity;Increase Strength and Stamina;Able to understand and use rate of perceived exertion (RPE) scale;Knowledge and understanding of Target Heart Rate Range (THRR);Able to check pulse independently;Understanding of Exercise Prescription  Increase Physical Activity;Increase Strength and Stamina;Able to understand and use rate of perceived exertion (RPE) scale;Knowledge and understanding of Target Heart Rate Range (THRR);Able to check pulse independently;Understanding of Exercise Prescription  Increase Physical Activity;Increase Strength and Stamina;Able to understand and use rate of perceived exertion (RPE) scale;Knowledge and understanding of Target Heart Rate Range (THRR);Able to check pulse independently;Understanding of Exercise Prescription     Comments  Pt. has attended 6 exercise sessions so far. He is eager to get back in to the swing of things and increase his strength and stamina. He has missed a few days due to family issues but works hard when he is here. We will continue to monitor and progress as we see fit.   Pt. continues to progess well with his exercise. He is up to 2.8 MPH on the TM. He feels like he is getting stronger and is ready to be back to the workloads he was using prior to his heart event.    Pt. returned after 2 weeks off for a death in his family. He continues to do well in the program and is tolerating all the exercise with ease.      Expected Outcomes  to lose weight, his goal is 25 pounds   to lose weight, his goal is 25 pounds   to lose weight, his goal is 25 pounds          Discharge Exercise Prescription (Final Exercise Prescription Changes): Exercise Prescription Changes - 11/07/18 0800      Response to Exercise   Blood Pressure (Admit)  140/60    Blood Pressure (Exercise)  140/62    Blood Pressure (Exit)  120/74    Heart Rate (Admit)  58 bpm    Heart Rate (Exercise)  83 bpm    Heart Rate (Exit)  81 bpm    Rating of Perceived Exertion (Exercise)  12    Comments  returned after 2 weeks out due to a death in the family.     Duration  Continue with 30 min of aerobic exercise without signs/symptoms of physical distress.    Intensity  THRR unchanged      Progression   Progression  Continue to progress workloads to maintain intensity without signs/symptoms of physical distress.    Average METs  4.82      Resistance Training   Training Prescription  Yes    Weight  4    Reps  10-15      Treadmill   MPH  2.8    Grade  0    Minutes  17    METs  3.14      Recumbant Elliptical   Level  2    RPM  85    Watts  112    Minutes  22    METs  6.5      Home Exercise Plan   Plans to continue exercise at  Home (comment)    Frequency  Add 2 additional days to program exercise sessions.    Initial Home Exercises Provided  08/28/18       Nutrition:  Target Goals: Understanding of nutrition guidelines, daily intake of sodium <1554m, cholesterol <2066m calories 30% from fat and 7% or less from saturated fats, daily to have 5 or more servings of fruits and vegetables.  Biometrics: Pre Biometrics - 08/28/18 1024      Pre Biometrics   Height  _0  (1.753 m)    Waist Circumference  40.5 inches    Hip Circumference  36 inches    Waist to Hip Ratio  1.12 %     Triceps Skinfold  6 mm    % Body Fat  24.3 %    Grip Strength  13.2 kg    Flexibility  14.5 in    Single Leg Stand  37.89 seconds        Nutrition Therapy Plan and Nutrition Goals: Nutrition Therapy & Goals - 11/15/18 1420      Nutrition Therapy   RD appointment deferred  Yes      Personal Nutrition Goals   Comments  Patient has not meet with RD. This is his 2nd time in the program and he meet with him the 1st time. He says he has not changed his diet since starting the program. He follows a diabetic, low NA, low fat diet. Will continue to monitor.       Intervention Plan   Intervention  Nutrition handout(s) given to patient.       Nutrition Assessments: Nutrition Assessments - 08/28/18 1137      MEDFICTS Scores   Pre Score  18       Nutrition Goals Re-Evaluation:   Nutrition Goals Discharge (Final Nutrition Goals Re-Evaluation):   Psychosocial: Target Goals: Acknowledge presence or absence of significant depression and/or stress, maximize coping skills, provide positive support system. Participant is able to verbalize types and ability to use techniques and skills needed for reducing stress and depression.  Initial Review & Psychosocial Screening: Initial Psych Review & Screening - 08/28/18 1136      Initial Review   Current issues with  None Identified      Family Dynamics   Good Support System?  Yes      Barriers   Psychosocial barriers to participate in program  There are no identifiable barriers or psychosocial needs.      Screening Interventions   Interventions  Encouraged to exercise    Expected Outcomes  Short Term goal: Identification and review with participant of any Quality of Life or Depression concerns found by scoring the questionnaire.;Long Term goal: The participant improves quality of Life and PHQ9 Scores as seen by post scores and/or verbalization of changes       Quality of Life Scores: Quality of Life - 08/28/18 1025      Quality of  Life   Select  Quality of Life      Quality of Life Scores   Health/Function Pre  19.63 %    Socioeconomic Pre  23.43 %    Psych/Spiritual Pre  21.79 %  Family Pre  27.6 %    GLOBAL Pre  22.03 %      Scores of 19 and below usually indicate a poorer quality of life in these areas.  A difference of  2-3 points is a clinically meaningful difference.  A difference of 2-3 points in the total score of the Quality of Life Index has been associated with significant improvement in overall quality of life, self-image, physical symptoms, and general health in studies assessing change in quality of life.  PHQ-9: Recent Review Flowsheet Data    Depression screen [facility] 2/9 08/28/2018 02/27/2018 10/25/2017 04/26/2017   Decreased Interest 0 0 0 0   Down, Depressed, Hopeless 0 0 0 0   PHQ - 2 Score 0 0 0 0   Altered sleeping _0 -   Tired, decreased energy _1 -   Change in appetite 1 1 0 -   Feeling bad or failure about yourself  0 0 0 -   Trouble concentrating 0 0 0 -   Moving slowly or fidgety/restless 0 0 0 -   Suicidal thoughts 0 0 0 -   PHQ-9 Score _2 -   Difficult doing work/chores Not difficult at all Not difficult at all Not difficult at all -     Interpretation of Total Score  Total Score Depression Severity:  1-4 = Minimal depression, 5-9 = Mild depression, 10-14 = Moderate depression, 15-19 = Moderately severe depression, 20-27 = Severe depression   Psychosocial Evaluation and Intervention: Psychosocial Evaluation - 08/28/18 1136      Psychosocial Evaluation & Interventions   Interventions  Encouraged to exercise with the program and follow exercise prescription    Continue Psychosocial Services   No Follow up required       Psychosocial Re-Evaluation: Psychosocial Re-Evaluation    Row Name 09/21/18 0758 10/18/18 1315 11/15/18 1423         Psychosocial Re-Evaluation   Current issues with  None Identified  None Identified  None Identified     Comments  Patient's  initial QOL score was 22.03 and his PHQ-9 score was with no psychosocial issues identified.   Patient's initial QOL score was 22.81 and his PHQ-9 score was 2 with no psychosocial issues identified.   Patient's initial QOL score was 22.81 and his PHQ-9 score was 2 with no psychosocial issues identified.      Expected Outcomes  Patient will have no psychosocial issues identified at discharge.   Patient will have no psychosocial issues identified at discharge.   Patient will have no psychosocial issues identified at discharge.      Interventions  Stress management education;Encouraged to attend Cardiac Rehabilitation for the exercise;Relaxation education  Stress management education;Encouraged to attend Cardiac Rehabilitation for the exercise;Relaxation education  Stress management education;Encouraged to attend Cardiac Rehabilitation for the exercise;Relaxation education     Continue Psychosocial Services   No Follow up required  No Follow up required  No Follow up required        Psychosocial Discharge (Final Psychosocial Re-Evaluation): Psychosocial Re-Evaluation - 11/15/18 1423      Psychosocial Re-Evaluation   Current issues with  None Identified    Comments  Patient's initial QOL score was 22.81 and his PHQ-9 score was 2 with no psychosocial issues identified.     Expected Outcomes  Patient will have no psychosocial issues identified at discharge.     Interventions  Stress management education;Encouraged to attend Cardiac Rehabilitation for the exercise;Relaxation education  Continue Psychosocial Services   No Follow up required       Vocational Rehabilitation: Provide vocational rehab assistance to qualifying candidates.   Vocational Rehab Evaluation & Intervention: Vocational Rehab - 08/28/18 1138      Initial Vocational Rehab Evaluation & Intervention   Assessment shows need for Vocational Rehabilitation  No       Education: Education Goals: Education classes will be provided  on a weekly basis, covering required topics. Participant will state understanding/return demonstration of topics presented.  Learning Barriers/Preferences: Learning Barriers/Preferences - 08/28/18 1138      Learning Barriers/Preferences   Learning Barriers  None    Learning Preferences  Skilled Demonstration;Group Instruction;Individual Instruction       Education Topics: Hypertension, Hypertension Reduction -Define heart disease and high blood pressure. Discus how high blood pressure affects the body and ways to reduce high blood pressure.   Exercise and Your Heart -Discuss why it is important to exercise, the FITT principles of exercise, normal and abnormal responses to exercise, and how to exercise safely.   CARDIAC REHAB PHASE II EXERCISE from 02/15/2018 in [place]  Date  01/25/18  Educator  DC  Instruction Review Code  2- Demonstrated Understanding      Angina -Discuss definition of angina, causes of angina, treatment of angina, and how to decrease risk of having angina.   CARDIAC REHAB PHASE II EXERCISE from 02/15/2018 in [place]  Date  11/02/17  Educator  DJ  Instruction Review Code  2- Demonstrated Understanding      Cardiac Medications -Review what the following cardiac medications are used for, how they affect the body, and side effects that may occur when taking the medications.  Medications include Aspirin, Beta blockers, calcium channel blockers, ACE Inhibitors, angiotensin receptor blockers, diuretics, digoxin, and antihyperlipidemics.   CARDIAC REHAB PHASE II EXERCISE from 11/15/2018 in [place]  Date  11/08/18  Educator  Wynetta Emery  Instruction Review Code  2- Demonstrated Understanding      Congestive Heart Failure -Discuss the definition of CHF, how to live with CHF, the signs and symptoms of CHF, and how keep track of weight and sodium intake.   CARDIAC REHAB PHASE II EXERCISE from  11/15/2018 in [place]  Date  11/15/18  Educator  Wynetta Emery  Instruction Review Code  2- Demonstrated Understanding      Heart Disease and Intimacy -Discus the effect sexual activity has on the heart, how changes occur during intimacy as we age, and safety during sexual activity.   Smoking Cessation / COPD -Discuss different methods to quit smoking, the health benefits of quitting smoking, and the definition of COPD.   CARDIAC REHAB PHASE II EXERCISE from 02/15/2018 in [place]  Date  12/01/17  Educator  DC  Instruction Review Code  2- Demonstrated Understanding      Nutrition I: Fats -Discuss the types of cholesterol, what cholesterol does to the heart, and how cholesterol levels can be controlled.   CARDIAC REHAB PHASE II EXERCISE from 02/15/2018 in [place]  Date  12/07/17  Educator  DC  Instruction Review Code  2- Demonstrated Understanding      Nutrition II: Labels -Discuss the different components of food labels and how to read food label   CARDIAC REHAB PHASE II EXERCISE from 11/15/2018 in [place]  Date  09/15/18  Educator  Coad  Instruction Review Code  2- Demonstrated Understanding  Heart Parts/Heart Disease and PAD -Discuss the anatomy of the heart, the pathway of blood circulation through the heart, and these are affected by heart disease.   CARDIAC REHAB PHASE II EXERCISE from 11/15/2018 in [place]  Date  09/20/18  Educator  Wynetta Emery  Instruction Review Code  2- Demonstrated Understanding      Stress I: Signs and Symptoms -Discuss the causes of stress, how stress may lead to anxiety and depression, and ways to limit stress.   CARDIAC REHAB PHASE II EXERCISE from 11/15/2018 in [place]  Date  09/27/18  Educator  Wynetta Emery  Instruction Review Code  2- Demonstrated Understanding      Stress II: Relaxation -Discuss  different types of relaxation techniques to limit stress.   CARDIAC REHAB PHASE II EXERCISE from 11/15/2018 in [place]  Date  10/04/18  Educator  Coad  Instruction Review Code  2- Demonstrated Understanding      Warning Signs of Stroke / TIA -Discuss definition of a stroke, what the signs and symptoms are of a stroke, and how to identify when someone is having stroke.   CARDIAC REHAB PHASE II EXERCISE from 11/15/2018 in [place]  Date  10/11/18  Educator  Coad  Instruction Review Code  2- Demonstrated Understanding      Knowledge Questionnaire Score: Knowledge Questionnaire Score - 08/28/18 1138      Knowledge Questionnaire Score   Pre Score  23/24       Core Components/Risk Factors/Patient Goals at Admission: Personal Goals and Risk Factors at Admission - 08/28/18 1138      Core Components/Risk Factors/Patient Goals on Admission    Weight Management  Yes    Admit Weight  195 lb 9.6 oz (88.7 kg)    Goal Weight: Short Term  185 lb 9.6 oz (84.2 kg)    Goal Weight: Long Term  175 lb 9.6 oz (79.7 kg)    Expected Outcomes  Short Term: Continue to assess and modify interventions until short term weight is achieved;Long Term: Adherence to nutrition and physical activity/exercise program aimed toward attainment of established weight goal    Personal Goal Other  Yes    Personal Goal  Lose 25lbs, regain heat health    Intervention  Attend CR 3 x week and suopplement with at home exercise 2 x week.    Expected Outcomes  Reach personal goals       Core Components/Risk Factors/Patient Goals Review:  Goals and Risk Factor Review    Row Name 09/21/18 0754 10/18/18 1312 11/15/18 1420         Core Components/Risk Factors/Patient Goals Review   Personal Goals Review  Weight Management/Obesity;Diabetes Get healthier; lose 20 lbs.   Weight Management/Obesity;Diabetes Regain health; lose 25 lbs.   Weight Management/Obesity;Diabetes Regain  health; lose 25 lbs.      Review  Patient has completed 7 sessions maintaining his weigth since his initial visit. He is doing well in the program with progression. He recently completed the program and was in our forever fit maintenance program but had another event. He says he is getting in better physical shape and feels like he is getting his heart healthy again. He blood pressure is better controlled and his reported fasting glucose readings are averaging 130. He has no recent A1C's on file. Will continue to monitor for progress.   Patient has completed 14 sessions maintaining his weight since last 30 day review. He continues to do  well in the program with progression. He says he feels like he is getting his heart back in heart healthy shape and feels his stamina is improving. His last A1C was 6 months ago at 6.2. His blood pressue is controlled. Will continue to monitor for progress.   Patient has completed 19 sessions losing 2 lbs since last 30 day review. He continues to do well in the program with progression. He states the program has helped him get back into a healthy exercise habit and he has learned a lot from the education classes. He has not had an A1C recently. His reported fasting glucose readings are averaging 130 mg/dl. Will continue to monitor for progress.      Expected Outcomes  Patient will continue to attend sessions and complete the program meeting his personal goals.   Patient will continue to attend sessions and complete the program meeting his personal goals.   Patient will continue to attend sessions and complete the program meeting his personal goals.         Core Components/Risk Factors/Patient Goals at Discharge (Final Review):  Goals and Risk Factor Review - 11/15/18 1420      Core Components/Risk Factors/Patient Goals Review   Personal Goals Review  Weight Management/Obesity;Diabetes   Regain health; lose 25 lbs.    Review  Patient has completed 19 sessions losing 2 lbs  since last 30 day review. He continues to do well in the program with progression. He states the program has helped him get back into a healthy exercise habit and he has learned a lot from the education classes. He has not had an A1C recently. His reported fasting glucose readings are averaging 130 mg/dl. Will continue to monitor for progress.     Expected Outcomes  Patient will continue to attend sessions and complete the program meeting his personal goals.        ITP Comments: ITP Comments    Row Name 08/28/18 1115           ITP Comments  Mr. Kurka finished our program, joined maintenance. He has recently had another episode and is now coming back through the program. He is eager to get started.           Comments: ITP REVIEW Patient doing well in the program. Will continue to monitor for progress.

## 2018-11-17 ENCOUNTER — Encounter (HOSPITAL_COMMUNITY)
Admission: RE | Admit: 2018-11-17 | Discharge: 2018-11-17 | Disposition: A | Discharge status: Home / Self Care | Payer: 59 | Source: Ambulatory Visit | Attending: Cardiovascular Disease | Admitting: Cardiovascular Disease

## 2018-11-17 DIAGNOSIS — I214 Non-ST elevation (NSTEMI) myocardial infarction: Secondary | ICD-10-CM | POA: Diagnosis not present

## 2018-11-17 DIAGNOSIS — Z955 Presence of coronary angioplasty implant and graft: Secondary | ICD-10-CM

## 2018-11-17 NOTE — Progress Notes (Signed)
Daily Session Note  Patient Details  Name: SOLOMON SKOWRONEK MRN: [id number] Date of Birth: [birth year] Referring Provider:     CARDIAC REHAB PHASE II ORIENTATION from 08/28/2018 in [place]  Referring Provider  Branch       Encounter Date: 11/17/2018  Check In: Session Check In - 11/17/18 0930      Check-In   Supervising physician immediately available to respond to emergencies  See telemetry face sheet for immediately available MD    Location  [facility]    Staff Present  Benay Pike, Exercise Physiologist;Diane Coad, MS, EP, [facility], Exercise Physiologist;Other    Medication changes reported      No    Fall or balance concerns reported     No    Warm-up and Cool-down  Performed as group-led instruction    Resistance Training Performed  Yes    VAD Patient?  No    PAD/SET Patient?  No      Pain Assessment   Currently in Pain?  No/denies    Pain Score  0-No pain    Multiple Pain Sites  No       Capillary Blood Glucose: No results found for this or any previous visit (from the past 24 hour(s)).    Social History   Tobacco Use  Smoking Status Former Smoker  . Packs/day: 2.00  . Years: 30.00  . Pack years: 60.00  . Last attempt to quit: 03/25/2017  . Years since quitting: 1.6  Smokeless Tobacco Never Used    Goals Met:  Proper associated with RPD/PD & O2 Sat Independence with exercise equipment Exercise tolerated well No report of cardiac concerns or symptoms Strength training completed today  Goals Unmet:  Not Applicable  Comments: Pt able to follow exercise prescription today without complaint.  Will continue to monitor for progression. Check out 1030.   Dr. Kate Sable is Medical Director for [facility] Cardiac and Pulmonary Rehab.

## 2018-11-20 ENCOUNTER — Encounter (HOSPITAL_COMMUNITY)
Admission: RE | Admit: 2018-11-20 | Discharge: 2018-11-20 | Disposition: A | Discharge status: Home / Self Care | Payer: 59 | Source: Ambulatory Visit | Attending: Cardiovascular Disease | Admitting: Cardiovascular Disease

## 2018-11-20 DIAGNOSIS — I214 Non-ST elevation (NSTEMI) myocardial infarction: Secondary | ICD-10-CM | POA: Diagnosis not present

## 2018-11-20 DIAGNOSIS — Z955 Presence of coronary angioplasty implant and graft: Secondary | ICD-10-CM

## 2018-11-20 LAB — HM DIABETES EYE EXAM

## 2018-11-20 NOTE — Progress Notes (Signed)
Daily Session Note  Patient Details  Name: [name] Foster MRN: [id number] Date of Birth: 11/05/1955 Referring Provider:     CARDIAC REHAB PHASE II ORIENTATION from 08/28/2018 in [place]  Referring Provider  Branch       Encounter Date: 11/20/2018  Check In: Session Check In - 11/20/18 0930      Check-In   Supervising physician immediately available to respond to emergencies  See telemetry face sheet for immediately available MD    Location  [facility]    Staff Present  Benay Pike, Exercise Physiologist;Diane Coad, MS, EP, [facility], Exercise Physiologist    Medication changes reported      No    Fall or balance concerns reported     No    Warm-up and Cool-down  Performed as group-led instruction    Resistance Training Performed  Yes    VAD Patient?  No    PAD/SET Patient?  No      Pain Assessment   Currently in Pain?  No/denies    Pain Score  0-No pain    Multiple Pain Sites  No       Capillary Blood Glucose: No results found for this or any previous visit (from the past 24 hour(s)).    Social History   Tobacco Use  Smoking Status Former Smoker  . Packs/day: 2.00  . Years: 30.00  . Pack years: 60.00  . Last attempt to quit: 03/25/2017  . Years since quitting: 1.6  Smokeless Tobacco Never Used    Goals Met:  Proper associated with RPD/PD & O2 Sat Independence with exercise equipment Exercise tolerated well No report of cardiac concerns or symptoms Strength training completed today  Goals Unmet: not applicable   Comments: Pt able to follow exercise prescription today without complaint.  Will continue to monitor for progression. Check out 1030.   Dr. Kate Sable is Medical Director for [facility] Cardiac and Pulmonary Rehab.

## 2018-11-21 DIAGNOSIS — M1611 Unilateral primary osteoarthritis, right hip: Secondary | ICD-10-CM | POA: Insufficient documentation

## 2018-11-27 ENCOUNTER — Encounter (HOSPITAL_COMMUNITY)
Admission: RE | Admit: 2018-11-27 | Discharge: 2018-11-27 | Disposition: A | Discharge status: Home / Self Care | Payer: 59 | Source: Ambulatory Visit | Attending: Cardiovascular Disease | Admitting: Cardiovascular Disease

## 2018-11-27 ENCOUNTER — Other Ambulatory Visit: Payer: Self-pay

## 2018-11-27 DIAGNOSIS — I214 Non-ST elevation (NSTEMI) myocardial infarction: Secondary | ICD-10-CM | POA: Diagnosis not present

## 2018-11-27 DIAGNOSIS — Z955 Presence of coronary angioplasty implant and graft: Secondary | ICD-10-CM

## 2018-12-01 NOTE — Progress Notes (Signed)
Daily Session Note  Patient Details  Name: [name] Foster MRN: [id number] Date of Birth: 05/08/1956 Referring Provider:     CARDIAC REHAB PHASE II ORIENTATION from 08/28/2018 in [place]  Referring Provider  Branch       Encounter Date: 11/27/2018  Check In:   Capillary Blood Glucose: No results found for this or any previous visit (from the past 24 hour(s)).    Social History   Tobacco Use  Smoking Status Former Smoker  . Packs/day: 2.00  . Years: 30.00  . Pack years: 60.00  . Last attempt to quit: 03/25/2017  . Years since quitting: 1.6  Smokeless Tobacco Never Used    Goals Met:  Independence with exercise equipment Exercise tolerated well No report of cardiac concerns or symptoms Strength training completed today  Goals Unmet:  Not Applicable  Comments: Pt able to follow exercise prescription today without complaint.  Will continue to monitor for progression. Check out 1030.   Dr. Kate Sable is Medical Director for [facility] Cardiac and Pulmonary Rehab.

## 2018-12-01 NOTE — Progress Notes (Addendum)
Cardiac Individual Treatment Plan  Patient Details  Name: [name] Foster MRN: [id number] Date of Birth: [birth year] Referring Provider:     CARDIAC REHAB PHASE II ORIENTATION from 08/28/2018 in [place]  Referring Provider  Branch       Initial Encounter Date:    CARDIAC REHAB PHASE II ORIENTATION from 08/28/2018 in [place]  Date  08/28/18      Visit Diagnosis: NSTEMI (non-ST elevated myocardial infarction) [facility])  Status post coronary artery stent placement  Patient's Home Medications on Admission:  Current Outpatient Medications:  .  amLODipine (NORVASC) 5 MG tablet, TAKE 1 TABLET BY MOUTH EVERY DAY, Disp: 30 tablet, Rfl: 10 .  atorvastatin (LIPITOR) 40 MG tablet, Take 1 tablet (40 mg total) by mouth daily., Disp: 90 tablet, Rfl: 3 .  clopidogrel (PLAVIX) 75 MG tablet, Take 1 tablet (75 mg total) by mouth daily., Disp: 30 tablet, Rfl: 11 .  [employer] ASPIRIN LOW STRENGTH 81 MG EC tablet, TAKE 1 TABLET BY MOUTH EVERY DAY, Disp: 90 tablet, Rfl: 3 .  hydrALAZINE (APRESOLINE) 50 MG tablet, TAKE 1 TABLET (50 MG TOTAL) BY MOUTH EVERY 8 (EIGHT) HOURS., Disp: 270 tablet, Rfl: 3 .  HYDROcodone-acetaminophen (NORCO) 7.5-325 MG tablet, Take 1 tablet by mouth every 6 (six) hours as needed for moderate pain., Disp: , Rfl:  .  isosorbide mononitrate (IMDUR) 30 MG 24 hr tablet, Take 0.5 tablets (15 mg total) by mouth daily., Disp: 45 tablet, Rfl: 2 .  losartan (COZAAR) 100 MG tablet, Take 1 tablet (100 mg total) by mouth daily., Disp: 30 tablet, Rfl: 11 .  metFORMIN (GLUCOPHAGE) 1000 MG tablet, Take 500 mg by mouth 2 (two) times daily with a meal., Disp: , Rfl: 6 .  metoprolol tartrate (LOPRESSOR) 50 MG tablet, Take 1 tablet (50 mg total) by mouth 2 (two) times daily., Disp: 180 tablet, Rfl: 3 .  nitroGLYCERIN (NITROSTAT) 0.4 MG SL tablet, Place 1 tablet (0.4 mg total) under the tongue every 5 (five) minutes x 3 doses as needed for chest pain., Disp:  25 tablet, Rfl: 11 .  zolpidem (AMBIEN) 10 MG tablet, Take 10 mg by mouth at bedtime as needed for sleep. , Disp: , Rfl:   Past Medical History: Past Medical History:  Diagnosis Date  . CAD in native artery    a. 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 DES to left PDA 09/20/17  . Diabetes mellitus without complication ([place])   . High cholesterol   . Hyperlipidemia   . Hypertension   . Insomnia   . Pulmonary hypertension ([place])    a. TTE 03/25/17; EF 60-65%, nl WM, LV diastolic fxn nl, trivial AI, RV cavity size, wall thickness, and sys fxn nl, trivial TR, PASP 39 mmHg, trivial pericardial effusion  . Sleep apnea    could not use a cpap  . Wears dentures    full top-partial bottom  . Wears glasses     Tobacco Use: Social History   Tobacco Use  Smoking Status Former Smoker  . Packs/day: 2.00  . Years: 30.00  . Pack years: 60.00  . Last attempt to quit: 03/25/2017  . Years since quitting: 1.6  Smokeless Tobacco Never Used    Labs: Recent Review Scientist, physiological    Labs for ITP Cardiac and Pulmonary Rehab Latest Ref Rng & Units 03/25/2017 05/13/2017  07/01/2018 08/24/2018 09/29/2018   Cholestrol 100 - 199 mg/dL 163 81(L) 151 111 118   LDLCALC 0 - 99 mg/dL 92 41 81 51 55   HDL >39 mg/dL 25(L) 24(L) 34(L) 45 45   Trlycerides 0 - 149 mg/dL 229(H) 82 180(H) 73 90   Hemoglobin A1c 4.8 - 5.6 % 9.4(H) - - - -   PHART 7.350 - 7.450 7.206(L) - - - -   PCO2ART 32.0 - 48.0 mmHg 53.8(H) - - - -   HCO3 20.0 - 28.0 mmol/L 21.3 - - - -   TCO2 0 - 100 mmol/L 23 - - - -   ACIDBASEDEF 0.0 - 2.0 mmol/L 7.0(H) - - - -   O2SAT % 99.0 - - - -      Capillary Blood Glucose: Lab Results  Component Value Date   GLUCAP 120 (H) 07/04/2018   GLUCAP 113 (H) 07/04/2018   GLUCAP 170 (H) 07/03/2018   GLUCAP 98 07/03/2018   GLUCAP 102 (H) 07/03/2018     Exercise Target  Goals: Exercise Program Goal: Individual exercise prescription set using results from initial 6 min walk test and THRR while considering  patient's activity barriers and safety.   Exercise Prescription Goal: Starting with aerobic activity 30 plus minutes a day, 3 days per week for initial exercise prescription. Provide home exercise prescription and guidelines that participant acknowledges understanding prior to discharge.  Activity Barriers & Risk Stratification: Activity Barriers & Cardiac Risk Stratification - 08/28/18 1021      Activity Barriers & Cardiac Risk Stratification   Activity Barriers  None    Cardiac Risk Stratification  High       6 Minute Walk: 6 Minute Walk    Row Name 08/28/18 1020         6 Minute Walk   Phase  Initial     Distance  1600 feet     Walk Time  6 minutes     # of Rest Breaks  0     MPH  3.03     METS  3.32     RPE  11     Perceived Dyspnea   8     VO2 Peak  12.47     Symptoms  No     Resting HR  59 bpm     Resting BP  120/64     Resting Oxygen Saturation   95 %     Exercise Oxygen Saturation  during 6 min walk  97 %     Max Ex. HR  76 bpm     Max Ex. BP  138/64     2 Minute Post BP  126/64        Oxygen Initial Assessment:   Oxygen Re-Evaluation:   Oxygen Discharge (Final Oxygen Re-Evaluation):   Initial Exercise Prescription: Initial Exercise Prescription - 08/28/18 1000      Date of Initial Exercise RX and Referring Provider   Date  08/28/18    Referring Provider  Branch     Expected Discharge Date  11/27/18      Treadmill   MPH  1.6    Grade  0    Minutes  17    METs  2.22      Recumbant Elliptical   Level  1    RPM  51    Watts  55    Minutes  22    METs  3.6      Prescription Details  Frequency (times per week)  3    Duration  Progress to 30 minutes of continuous aerobic without signs/symptoms of physical distress      Intensity   THRR 40-80% of Max Heartrate  [phone]    Ratings of Perceived  Exertion  11-13    Perceived Dyspnea  0-4      Progression   Progression  Continue progressive overload as per policy without signs/symptoms or physical distress.      Resistance Training   Training Prescription  Yes    Weight  3    Reps  10-15       Perform Capillary Blood Glucose checks as needed.  Exercise Prescription Changes: Exercise Prescription Changes    Row Name 08/28/18 1000 09/12/18 1200 09/26/18 1300 10/10/18 0800 10/26/18 1200     Response to Exercise   Blood Pressure (Admit)  120/64  118/68  108/60  118/60  150/62   Blood Pressure (Exercise)  138/64  108/70  120/70  130/70  160/60   Blood Pressure (Exit)  126/64  112/60  110/62  112/60  136/70   Heart Rate (Admit)  59 bpm  60 bpm  74 bpm  63 bpm  63 bpm   Heart Rate (Exercise)  76 bpm  87 bpm  109 bpm  106 bpm  116 bpm   Heart Rate (Exit)  63 bpm  70 bpm  71 bpm  71 bpm  75 bpm   Oxygen Saturation (Admit)  95 %  -  -  -  -   Oxygen Saturation (Exercise)  97 %  -  -  -  -   Oxygen Saturation (Exit)  96 %  -  -  -  -   Rating of Perceived Exertion (Exercise)  '11  12  12  12  12   '$ Perceived Dyspnea (Exercise)  8  -  -  -  -   Comments  6 minute walk test  fist two weeks of exercise   -  first two weeks of exercise   increase in overall MET level    Duration  Progress to 30 minutes of  aerobic without signs/symptoms of physical distress  Progress to 30 minutes of  aerobic without signs/symptoms of physical distress  Continue with 30 min of aerobic exercise without signs/symptoms of physical distress.  Continue with 30 min of aerobic exercise without signs/symptoms of physical distress.  Continue with 30 min of aerobic exercise without signs/symptoms of physical distress.   Intensity  THRR New [phone]  THRR unchanged  THRR unchanged  THRR unchanged  THRR unchanged     Progression   Progression  -  Continue to progress workloads to maintain intensity without signs/symptoms of physical distress.  Continue to progress  workloads to maintain intensity without signs/symptoms of physical distress.  Continue to progress workloads to maintain intensity without signs/symptoms of physical distress.  Continue to progress workloads to maintain intensity without signs/symptoms of physical distress.   Average METs  -  4.1  4.65  4.09  4.87     Resistance Training   Training Prescription  -  Yes  Yes  Yes  Yes   Weight  -  '3  3  4  4   '$ Reps  -  10-15  10-15  10-15  10-15     Treadmill   MPH  -  1.8  2.1  2.6  2.8   Grade  -  0  0  0  0  Minutes  -  '17  17  17  17   '$ METs  -  2.37  2.6  2.98  3.14     Recumbant Elliptical   Level  -  '1  1  2  2   '$ RPM  -  74  75  72  72   Watts  -  117  113  112  118   Minutes  -  '22  22  22  22   '$ METs  -  5.8  6.7  5.2  6.6     Home Exercise Plan   Plans to continue exercise at  -  Home (comment)  Home (comment)  Home (comment)  Home (comment)   Frequency  -  Add 2 additional days to program exercise sessions.  Add 2 additional days to program exercise sessions.  Add 2 additional days to program exercise sessions.  Add 2 additional days to program exercise sessions.   Initial Home Exercises Provided  -  08/28/18  08/28/18  08/28/18  08/28/18   Row Name 11/07/18 0800 11/20/18 1500           Response to Exercise   Blood Pressure (Admit)  140/60  128/74      Blood Pressure (Exercise)  140/62  150/68      Blood Pressure (Exit)  120/74  120/70      Heart Rate (Admit)  58 bpm  59 bpm      Heart Rate (Exercise)  83 bpm  93 bpm      Heart Rate (Exit)  81 bpm  68 bpm      Rating of Perceived Exertion (Exercise)  12  12      Comments  returned after 2 weeks out due to a death in the family.   -      Duration  Continue with 30 min of aerobic exercise without signs/symptoms of physical distress.  Continue with 30 min of aerobic exercise without signs/symptoms of physical distress.      Intensity  THRR unchanged  THRR unchanged        Progression   Progression  Continue to  progress workloads to maintain intensity without signs/symptoms of physical distress.  Continue to progress workloads to maintain intensity without signs/symptoms of physical distress.      Average METs  4.82  4.39        Resistance Training   Training Prescription  Yes  Yes      Weight  4  4      Reps  10-15  10-15        Treadmill   MPH  2.8  3      Grade  0  0      Minutes  17  17      METs  3.14  3.29        Recumbant Elliptical   Level  2  3      RPM  85  64      Watts  112  101      Minutes  22  22      METs  6.5  5.5        Home Exercise Plan   Plans to continue exercise at  Home (comment)  Home (comment)      Frequency  Add 2 additional days to program exercise sessions.  Add 2 additional days to program exercise sessions.      Initial Home Exercises Provided  08/28/18  08/28/18         Exercise Comments: Exercise Comments    Row Name 09/19/18 1009 10/16/18 1447 10/26/18 1247 11/13/18 1443     Exercise Comments  Pt. has done well in CR so far. He has attended 6 exercise sessions, working each time to increase his distance on both machines as well as his overall MET level. Once he is able to return to attending regularly he will see much progress.   Pt. has attended 14 exercise sessions so far. He has tolerated them all with ease and is eager to be able to get back to the levels he was using before his event.   Pt. has attended 15 sessions. His attendance has been spotty due to sickness in the family.   Pt. has attended 18 sessions so far. He is now back more regularly after having to struggle with a death in his family. He is tolerating all of the exercise well. He is now walking at 3.0 MPH on the TM. We will continue to monitor and progress as we see fit.        Exercise Goals and Review: Exercise Goals    Row Name 08/28/18 1023             Exercise Goals   Increase Physical Activity  Yes       Intervention  Provide advice, education, support and counseling  about physical activity/exercise needs.;Develop an individualized exercise prescription for aerobic and resistive training based on initial evaluation findings, risk stratification, comorbidities and participant's personal goals.       Expected Outcomes  Short Term: Attend rehab on a regular basis to increase amount of physical activity.       Increase Strength and Stamina  Yes       Intervention  Provide advice, education, support and counseling about physical activity/exercise needs.;Develop an individualized exercise prescription for aerobic and resistive training based on initial evaluation findings, risk stratification, comorbidities and participant's personal goals.       Expected Outcomes  Short Term: Increase workloads from initial exercise prescription for resistance, speed, and METs.;Long Term: Improve cardiorespiratory fitness, muscular endurance and strength as measured by increased METs and functional capacity (6MWT);Short Term: Perform resistance training exercises routinely during rehab and add in resistance training at home       Able to understand and use rate of perceived exertion (RPE) scale  Yes       Intervention  Provide education and explanation on how to use RPE scale       Expected Outcomes  Short Term: Able to use RPE daily in rehab to express subjective intensity level;Long Term:  Able to use RPE to guide intensity level when exercising independently       Able to understand and use Dyspnea scale  Yes       Intervention  Provide education and explanation on how to use Dyspnea scale       Expected Outcomes  Short Term: Able to use Dyspnea scale daily in rehab to express subjective sense of shortness of breath during exertion;Long Term: Able to use Dyspnea scale to guide intensity level when exercising independently       Knowledge and understanding of Target Heart Rate Range (THRR)  Yes       Intervention  Provide education and explanation of THRR including how the numbers were  predicted and where they are located for reference       Expected Outcomes  Short Term:  Able to state/look up THRR       Able to check pulse independently  Yes       Intervention  Provide education and demonstration on how to check pulse in carotid and radial arteries.;Review the importance of being able to check your own pulse for safety during independent exercise       Expected Outcomes  Short Term: Able to explain why pulse checking is important during independent exercise;Long Term: Able to check pulse independently and accurately       Understanding of Exercise Prescription  Yes       Intervention  Provide education, explanation, and written materials on patient's individual exercise prescription       Expected Outcomes  Short Term: Able to explain program exercise prescription;Long Term: Able to explain home exercise prescription to exercise independently          Exercise Goals Re-Evaluation : Exercise Goals Re-Evaluation    Row Name 09/19/18 1007 10/16/18 1446 11/13/18 1442         Exercise Goal Re-Evaluation   Exercise Goals Review  Increase Physical Activity;Increase Strength and Stamina;Able to understand and use rate of perceived exertion (RPE) scale;Knowledge and understanding of Target Heart Rate Range (THRR);Able to check pulse independently;Understanding of Exercise Prescription  Increase Physical Activity;Increase Strength and Stamina;Able to understand and use rate of perceived exertion (RPE) scale;Knowledge and understanding of Target Heart Rate Range (THRR);Able to check pulse independently;Understanding of Exercise Prescription  Increase Physical Activity;Increase Strength and Stamina;Able to understand and use rate of perceived exertion (RPE) scale;Knowledge and understanding of Target Heart Rate Range (THRR);Able to check pulse independently;Understanding of Exercise Prescription     Comments  Pt. has attended 6 exercise sessions so far. He is eager to get back in to the  swing of things and increase his strength and stamina. He has missed a few days due to family issues but works hard when he is here. We will continue to monitor and progress as we see fit.   Pt. continues to progess well with his exercise. He is up to 2.8 MPH on the TM. He feels like he is getting stronger and is ready to be back to the workloads he was using prior to his heart event.   Pt. returned after 2 weeks off for a death in his family. He continues to do well in the program and is tolerating all the exercise with ease.      Expected Outcomes  to lose weight, his goal is 25 pounds   to lose weight, his goal is 25 pounds   to lose weight, his goal is 25 pounds          Discharge Exercise Prescription (Final Exercise Prescription Changes): Exercise Prescription Changes - 11/20/18 1500      Response to Exercise   Blood Pressure (Admit)  128/74    Blood Pressure (Exercise)  150/68    Blood Pressure (Exit)  120/70    Heart Rate (Admit)  59 bpm    Heart Rate (Exercise)  93 bpm    Heart Rate (Exit)  68 bpm    Rating of Perceived Exertion (Exercise)  12    Duration  Continue with 30 min of aerobic exercise without signs/symptoms of physical distress.    Intensity  THRR unchanged      Progression   Progression  Continue to progress workloads to maintain intensity without signs/symptoms of physical distress.    Average METs  4.39  Resistance Training   Training Prescription  Yes    Weight  4    Reps  10-15      Treadmill   MPH  3    Grade  0    Minutes  17    METs  3.29      Recumbant Elliptical   Level  3    RPM  64    Watts  101    Minutes  22    METs  5.5      Home Exercise Plan   Plans to continue exercise at  Home (comment)    Frequency  Add 2 additional days to program exercise sessions.    Initial Home Exercises Provided  08/28/18       Nutrition:  Target Goals: Understanding of nutrition guidelines, daily intake of sodium '1500mg'$ , cholesterol '200mg'$ ,  calories 30% from fat and 7% or less from saturated fats, daily to have 5 or more servings of fruits and vegetables.  Biometrics: Pre Biometrics - 08/28/18 1024      Pre Biometrics   Height  '5\' 9"'$  (6.789 m)    Waist Circumference  40.5 inches    Hip Circumference  36 inches    Waist to Hip Ratio  1.12 %    Triceps Skinfold  6 mm    % Body Fat  24.3 %    Grip Strength  13.2 kg    Flexibility  14.5 in    Single Leg Stand  37.89 seconds        Nutrition Therapy Plan and Nutrition Goals: Nutrition Therapy & Goals - 11/15/18 1420      Nutrition Therapy   RD appointment deferred  Yes      Personal Nutrition Goals   Comments  Patient has not meet with RD. This is his 2nd time in the program and he meet with him the 1st time. He says he has not changed his diet since starting the program. He follows a diabetic, low NA, low fat diet. Will continue to monitor.       Intervention Plan   Intervention  Nutrition handout(s) given to patient.       Nutrition Assessments: Nutrition Assessments - 08/28/18 1137      MEDFICTS Scores   Pre Score  18       Nutrition Goals Re-Evaluation:   Nutrition Goals Discharge (Final Nutrition Goals Re-Evaluation):   Psychosocial: Target Goals: Acknowledge presence or absence of significant depression and/or stress, maximize coping skills, provide positive support system. Participant is able to verbalize types and ability to use techniques and skills needed for reducing stress and depression.  Initial Review & Psychosocial Screening: Initial Psych Review & Screening - 08/28/18 1136      Initial Review   Current issues with  None Identified      Family Dynamics   Good Support System?  Yes      Barriers   Psychosocial barriers to participate in program  There are no identifiable barriers or psychosocial needs.      Screening Interventions   Interventions  Encouraged to exercise    Expected Outcomes  Short Term goal: Identification and  review with participant of any Quality of Life or Depression concerns found by scoring the questionnaire.;Long Term goal: The participant improves quality of Life and PHQ9 Scores as seen by post scores and/or verbalization of changes       Quality of Life Scores: Quality of Life - 08/28/18 1025  Quality of Life   Select  Quality of Life      Quality of Life Scores   Health/Function Pre  19.63 %    Socioeconomic Pre  23.43 %    Psych/Spiritual Pre  21.79 %    Family Pre  27.6 %    GLOBAL Pre  22.03 %      Scores of 19 and below usually indicate a poorer quality of life in these areas.  A difference of  2-3 points is a clinically meaningful difference.  A difference of 2-3 points in the total score of the Quality of Life Index has been associated with significant improvement in overall quality of life, self-image, physical symptoms, and general health in studies assessing change in quality of life.  PHQ-9: Recent Review Flowsheet Data    Depression screen [facility] 2/9 08/28/2018 02/27/2018 10/25/2017 04/26/2017   Decreased Interest 0 0 0 0   Down, Depressed, Hopeless 0 0 0 0   PHQ - 2 Score 0 0 0 0   Altered sleeping '1 1 1 '$ -   Tired, decreased energy '3 1 1 '$ -   Change in appetite 1 1 0 -   Feeling bad or failure about yourself  0 0 0 -   Trouble concentrating 0 0 0 -   Moving slowly or fidgety/restless 0 0 0 -   Suicidal thoughts 0 0 0 -   PHQ-9 Score '5 3 2 '$ -   Difficult doing work/chores Not difficult at all Not difficult at all Not difficult at all -     Interpretation of Total Score  Total Score Depression Severity:  1-4 = Minimal depression, 5-9 = Mild depression, 10-14 = Moderate depression, 15-19 = Moderately severe depression, 20-27 = Severe depression   Psychosocial Evaluation and Intervention: Psychosocial Evaluation - 08/28/18 1136      Psychosocial Evaluation & Interventions   Interventions  Encouraged to exercise with the program and follow exercise prescription     Continue Psychosocial Services   No Follow up required       Psychosocial Re-Evaluation: Psychosocial Re-Evaluation    Row Name 09/21/18 0758 10/18/18 1315 11/15/18 1423         Psychosocial Re-Evaluation   Current issues with  None Identified  None Identified  None Identified     Comments  Patient's initial QOL score was 22.03 and his PHQ-9 score was with no psychosocial issues identified.   Patient's initial QOL score was 22.81 and his PHQ-9 score was 2 with no psychosocial issues identified.   Patient's initial QOL score was 22.81 and his PHQ-9 score was 2 with no psychosocial issues identified.      Expected Outcomes  Patient will have no psychosocial issues identified at discharge.   Patient will have no psychosocial issues identified at discharge.   Patient will have no psychosocial issues identified at discharge.      Interventions  Stress management education;Encouraged to attend Cardiac Rehabilitation for the exercise;Relaxation education  Stress management education;Encouraged to attend Cardiac Rehabilitation for the exercise;Relaxation education  Stress management education;Encouraged to attend Cardiac Rehabilitation for the exercise;Relaxation education     Continue Psychosocial Services   No Follow up required  No Follow up required  No Follow up required        Psychosocial Discharge (Final Psychosocial Re-Evaluation): Psychosocial Re-Evaluation - 11/15/18 1423      Psychosocial Re-Evaluation   Current issues with  None Identified    Comments  Patient's initial QOL score was 22.81  and his PHQ-9 score was 2 with no psychosocial issues identified.     Expected Outcomes  Patient will have no psychosocial issues identified at discharge.     Interventions  Stress management education;Encouraged to attend Cardiac Rehabilitation for the exercise;Relaxation education    Continue Psychosocial Services   No Follow up required       Vocational Rehabilitation: Provide vocational  rehab assistance to qualifying candidates.   Vocational Rehab Evaluation & Intervention: Vocational Rehab - 08/28/18 1138      Initial Vocational Rehab Evaluation & Intervention   Assessment shows need for Vocational Rehabilitation  No       Education: Education Goals: Education classes will be provided on a weekly basis, covering required topics. Participant will state understanding/return demonstration of topics presented.  Learning Barriers/Preferences: Learning Barriers/Preferences - 08/28/18 1138      Learning Barriers/Preferences   Learning Barriers  None    Learning Preferences  Skilled Demonstration;Group Instruction;Individual Instruction       Education Topics: Hypertension, Hypertension Reduction -Define heart disease and high blood pressure. Discus how high blood pressure affects the body and ways to reduce high blood pressure.   Exercise and Your Heart -Discuss why it is important to exercise, the FITT principles of exercise, normal and abnormal responses to exercise, and how to exercise safely.   CARDIAC REHAB PHASE II EXERCISE from 02/15/2018 in [place]  Date  01/25/18  Educator  DC  Instruction Review Code  2- Demonstrated Understanding      Angina -Discuss definition of angina, causes of angina, treatment of angina, and how to decrease risk of having angina.   CARDIAC REHAB PHASE II EXERCISE from 02/15/2018 in [place]  Date  11/02/17  Educator  DJ  Instruction Review Code  2- Demonstrated Understanding      Cardiac Medications -Review what the following cardiac medications are used for, how they affect the body, and side effects that may occur when taking the medications.  Medications include Aspirin, Beta blockers, calcium channel blockers, ACE Inhibitors, angiotensin receptor blockers, diuretics, digoxin, and antihyperlipidemics.   CARDIAC REHAB PHASE II EXERCISE from 11/15/2018 in [place]  Date  11/08/18  Educator  Wynetta Emery  Instruction Review Code  2- Demonstrated Understanding      Congestive Heart Failure -Discuss the definition of CHF, how to live with CHF, the signs and symptoms of CHF, and how keep track of weight and sodium intake.   CARDIAC REHAB PHASE II EXERCISE from 11/15/2018 in [place]  Date  11/15/18  Educator  Wynetta Emery  Instruction Review Code  2- Demonstrated Understanding      Heart Disease and Intimacy -Discus the effect sexual activity has on the heart, how changes occur during intimacy as we age, and safety during sexual activity.   Smoking Cessation / COPD -Discuss different methods to quit smoking, the health benefits of quitting smoking, and the definition of COPD.   CARDIAC REHAB PHASE II EXERCISE from 02/15/2018 in [place]  Date  12/01/17  Educator  DC  Instruction Review Code  2- Demonstrated Understanding      Nutrition I: Fats -Discuss the types of cholesterol, what cholesterol does to the heart, and how cholesterol levels can be controlled.   CARDIAC REHAB PHASE II EXERCISE from 02/15/2018 in [place]  Date  12/07/17  Educator  DC  Instruction Review Code  2- Demonstrated Understanding      Nutrition  II: Labels -Discuss the different components of food labels and how to read food label   CARDIAC REHAB PHASE II EXERCISE from 11/15/2018 in [place]  Date  09/15/18  Educator  Coad  Instruction Review Code  2- Demonstrated Understanding      Heart Parts/Heart Disease and PAD -Discuss the anatomy of the heart, the pathway of blood circulation through the heart, and these are affected by heart disease.   CARDIAC REHAB PHASE II EXERCISE from 11/15/2018 in [place]  Date  09/20/18  Educator  Wynetta Emery  Instruction Review Code  2- Demonstrated Understanding      Stress I: Signs and Symptoms -Discuss  the causes of stress, how stress may lead to anxiety and depression, and ways to limit stress.   CARDIAC REHAB PHASE II EXERCISE from 11/15/2018 in [place]  Date  09/27/18  Educator  Wynetta Emery  Instruction Review Code  2- Demonstrated Understanding      Stress II: Relaxation -Discuss different types of relaxation techniques to limit stress.   CARDIAC REHAB PHASE II EXERCISE from 11/15/2018 in [place]  Date  10/04/18  Educator  Coad  Instruction Review Code  2- Demonstrated Understanding      Warning Signs of Stroke / TIA -Discuss definition of a stroke, what the signs and symptoms are of a stroke, and how to identify when someone is having stroke.   CARDIAC REHAB PHASE II EXERCISE from 11/15/2018 in [place]  Date  10/11/18  Educator  Coad  Instruction Review Code  2- Demonstrated Understanding      Knowledge Questionnaire Score: Knowledge Questionnaire Score - 08/28/18 1138      Knowledge Questionnaire Score   Pre Score  23/24       Core Components/Risk Factors/Patient Goals at Admission: Personal Goals and Risk Factors at Admission - 08/28/18 1138      Core Components/Risk Factors/Patient Goals on Admission    Weight Management  Yes    Admit Weight  195 lb 9.6 oz (88.7 kg)    Goal Weight: Short Term  185 lb 9.6 oz (84.2 kg)    Goal Weight: Long Term  175 lb 9.6 oz (79.7 kg)    Expected Outcomes  Short Term: Continue to assess and modify interventions until short term weight is achieved;Long Term: Adherence to nutrition and physical activity/exercise program aimed toward attainment of established weight goal    Personal Goal Other  Yes    Personal Goal  Lose 25lbs, regain heat health    Intervention  Attend CR 3 x week and suopplement with at home exercise 2 x week.    Expected Outcomes  Reach personal goals       Core Components/Risk Factors/Patient Goals Review:  Goals and Risk Factor Review     Row Name 09/21/18 0754 10/18/18 1312 11/15/18 1420         Core Components/Risk Factors/Patient Goals Review   Personal Goals Review  Weight Management/Obesity;Diabetes Get healthier; lose 20 lbs.   Weight Management/Obesity;Diabetes Regain health; lose 25 lbs.   Weight Management/Obesity;Diabetes Regain health; lose 25 lbs.      Review  Patient has completed 7 sessions maintaining his weigth since his initial visit. He is doing well in the program with progression. He recently completed the program and was in our forever fit maintenance program but had another event. He says he is getting in better physical shape and feels like he is getting his  heart healthy again. He blood pressure is better controlled and his reported fasting glucose readings are averaging 130. He has no recent A1C's on file. Will continue to monitor for progress.   Patient has completed 14 sessions maintaining his weight since last 30 day review. He continues to do well in the program with progression. He says he feels like he is getting his heart back in heart healthy shape and feels his stamina is improving. His last A1C was 6 months ago at 6.2. His blood pressue is controlled. Will continue to monitor for progress.   Patient has completed 19 sessions losing 2 lbs since last 30 day review. He continues to do well in the program with progression. He states the program has helped him get back into a healthy exercise habit and he has learned a lot from the education classes. He has not had an A1C recently. His reported fasting glucose readings are averaging 130 mg/dl. Will continue to monitor for progress.      Expected Outcomes  Patient will continue to attend sessions and complete the program meeting his personal goals.   Patient will continue to attend sessions and complete the program meeting his personal goals.   Patient will continue to attend sessions and complete the program meeting his personal goals.         Core  Components/Risk Factors/Patient Goals at Discharge (Final Review):  Goals and Risk Factor Review - 11/15/18 1420      Core Components/Risk Factors/Patient Goals Review   Personal Goals Review  Weight Management/Obesity;Diabetes   Regain health; lose 25 lbs.    Review  Patient has completed 19 sessions losing 2 lbs since last 30 day review. He continues to do well in the program with progression. He states the program has helped him get back into a healthy exercise habit and he has learned a lot from the education classes. He has not had an A1C recently. His reported fasting glucose readings are averaging 130 mg/dl. Will continue to monitor for progress.     Expected Outcomes  Patient will continue to attend sessions and complete the program meeting his personal goals.        ITP Comments: ITP Comments    Row Name 08/28/18 1115 12/01/18 1302         ITP Comments  Mr. Cregg finished our program, joined maintenance. He has recently had another episode and is now coming back through the program. He is eager to get started.   Cardiac Rehab services have been suspended due to the COVID-19 restrictions starting 11/27/2018. Patient will restart the progam when restrictions have been lifted.         Comments: ITP REVIEW Patient doing well in the program. Cardiac Rehab services have been suspended due to the COVID-19 restrictions starting 11/27/2018. Patient will restart the progam when restrictions have been lifted. Will continue to monitor for progress.

## 2018-12-04 ENCOUNTER — Other Ambulatory Visit: Payer: Self-pay | Admitting: Cardiovascular Disease

## 2018-12-18 ENCOUNTER — Telehealth: Payer: Self-pay | Admitting: Physician Assistant

## 2018-12-18 NOTE — Telephone Encounter (Signed)
°  4/6 :  Spoke with patient. Patient reschedule appointment from 4/13 to 4/7. Patient's wife helped patient sign up for MyChart. So patient is now MyChart active and can login. Patient's wife downloaded Webex app on her smartphone she will be there to help patient with virtual appointment. Patient could successfully login to Webex with audio/video with help of spouse.

## 2018-12-18 NOTE — Progress Notes (Signed)
Virtual Visit via Video Note   This visit type was conducted due to national recommendations for restrictions regarding the COVID-19 Pandemic (e.g. social distancing) in an effort to limit this patient's exposure and mitigate transmission in our community.  Due to his co-morbid illnesses, this patient is at least at moderate risk for complications without adequate follow up.  This format is felt to be most appropriate for this patient at this time.  All issues noted in this document were discussed and addressed.  A limited physical exam was performed with this format.  Please refer to the patient's chart for his consent to telehealth for [facility].   Evaluation Performed:  Follow-up visit  Date:  12/19/2018   ID:  [name] Foster, DOB April 19, 1956, MRN [id number]  Patient Location: Home  Provider Location: Home  PCP:  [name], DO  Cardiologist:  Sherren Mocha, MD  Electrophysiologist:  None   Chief Complaint: Follow-up on coronary artery disease  History of Present Illness:    [name] Foster is a 63 y.o. male who presents via audio/video conferencing for a telehealth visit today.    He has coronary artery disease, diabetes, hypertension, hyperlipidemia.  He was admitted in 03/2017 with an inferoposterior STEMI tx with a DES to the LCx.  He had recurrent angina in 09/2017 and underwent PCI with DES to the LPDA.  He then suffered a NSTEMI in 10/19 and underwent orbital atherectomy and DES to the LAD.  He was last seen in 06/2018.    Today, he is doing well.  He was going to cardiac rehabilitation until this was canceled due to COVID-19.  He has not been having chest discomfort or significant shortness of breath.  He has not had syncope, paroxysmal nocturnal dyspnea or lower extremity swelling.  The patient does not have symptoms concerning for COVID-19 infection (fever, chills, cough, or new shortness of breath).    Past Medical History:  Diagnosis Date  . CAD (coronary  artery disease)    a. 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 DES to left PDA 09/20/17 // c. 10/19: LCx and LPDA stents ok; s/p DES to pLAD and POBA to D1  . Diabetes mellitus without complication ([place])   . Hyperlipidemia   . Hypertension   . Insomnia   . Pulmonary hypertension ([place])    a. TTE 03/25/17; EF 60-65%, nl WM, LV diastolic fxn nl, trivial AI, RV cavity size, wall thickness, and sys fxn nl, trivial TR, PASP 39 mmHg, trivial pericardial effusion  . Sleep apnea    could not use a cpap  . Wears dentures    full top-partial bottom  . Wears glasses    Past Surgical History:  Procedure Laterality Date  . COLONOSCOPY    . CORONARY ATHERECTOMY N/A 07/03/2018   Procedure: CORONARY ATHERECTOMY;  Surgeon: Burnell Blanks, MD;  Location: [place] [facility];  Service: Cardiovascular;  Laterality: N/A;  . CORONARY STENT INTERVENTION N/A 09/20/2017   Procedure: CORONARY STENT INTERVENTION;  Surgeon: Leonie Man, MD;  Location: [place] [facility];  Service: Cardiovascular;  Laterality: N/A;  . CORONARY STENT INTERVENTION N/A 07/03/2018   Procedure: CORONARY STENT INTERVENTION;  Surgeon: Burnell Blanks, MD;  Location: [place] [facility];  Service: Cardiovascular;  Laterality: N/A;  . CORONARY/GRAFT ACUTE MI REVASCULARIZATION N/A 03/25/2017   Procedure: Coronary/Graft Acute  MI Revascularization;  Surgeon: Sherren Mocha, MD;  Location: [place] [facility];  Service: Cardiovascular;  Laterality: N/A;  . GANGLION CYST EXCISION Left 07/25/2014   Procedure: EXCISION VOLAR AND DORSAL GANGLION LEFT WRIST;  Surgeon: Leanora Cover, MD;  Location: [place];  Service: Orthopedics;  Laterality: Left;  . INTRAVASCULAR PRESSURE WIRE/FFR STUDY N/A 09/20/2017   Procedure: INTRAVASCULAR PRESSURE WIRE/FFR STUDY;  Surgeon: Leonie Man, MD;  Location: [place] [facility];  Service: Cardiovascular;  Laterality: N/A;  . INTRAVASCULAR PRESSURE WIRE/FFR STUDY N/A 06/30/2018   Procedure: INTRAVASCULAR PRESSURE WIRE/FFR STUDY;  Surgeon: Nelva Bush, MD;  Location: [place] [facility];  Service: Cardiovascular;  Laterality: N/A;  . KNEE ARTHROSCOPY     rifgr and left  . LEFT HEART CATH AND CORONARY ANGIOGRAPHY N/A 03/25/2017   Procedure: Left Heart Cath and Coronary Angiography;  Surgeon: Sherren Mocha, MD;  Location: [place] [facility];  Service: Cardiovascular;  Laterality: N/A;  . LEFT HEART CATH AND CORONARY ANGIOGRAPHY N/A 09/20/2017   Procedure: LEFT HEART CATH AND CORONARY ANGIOGRAPHY;  Surgeon: Leonie Man, MD;  Location: [place] [facility];  Service: Cardiovascular;  Laterality: N/A;  . LEFT HEART CATH AND CORONARY ANGIOGRAPHY N/A 06/30/2018   Procedure: LEFT HEART CATH AND CORONARY ANGIOGRAPHY;  Surgeon: Nelva Bush, MD;  Location: [place] [facility];  Service: Cardiovascular;  Laterality: N/A;  . SHOULDER ARTHROSCOPY  2012   left  . SHOULDER ARTHROSCOPY     right  . TEMPORARY PACEMAKER N/A 03/25/2017   Procedure: Temporary Pacemaker;  Surgeon: Sherren Mocha, MD;  Location: [place] [facility];  Service: Cardiovascular;  Laterality: N/A;  . TONSILLECTOMY    . TOTAL KNEE ARTHROPLASTY  2002   left     Current Meds  Medication Sig  . amLODipine (NORVASC) 5 MG tablet TAKE 1 TABLET BY MOUTH EVERY DAY  . atorvastatin (LIPITOR) 40 MG tablet Take 1 tablet (40 mg total) by mouth daily.  . clopidogrel (PLAVIX) 75 MG tablet TAKE 1 TABLET BY MOUTH EVERY DAY  . CVS ASPIRIN LOW STRENGTH 81 MG EC tablet TAKE 1 TABLET BY MOUTH EVERY DAY  . hydrALAZINE (APRESOLINE) 50 MG tablet TAKE 1 TABLET (50 MG TOTAL) BY MOUTH EVERY 8 (EIGHT) HOURS.  . HYDROcodone-acetaminophen (NORCO) 7.5-325 MG tablet Take 1 tablet by mouth every 6 (six) hours as needed for moderate pain.  . isosorbide mononitrate (IMDUR) 30 MG 24 hr tablet Take 0.5 tablets (15 mg  total) by mouth daily.  Marland Kitchen losartan (COZAAR) 100 MG tablet TAKE 1 TABLET BY MOUTH EVERY DAY  . metFORMIN (GLUCOPHAGE-XR) 500 MG 24 hr tablet Take 500 mg by mouth every evening.  . metoprolol tartrate (LOPRESSOR) 50 MG tablet Take 1 tablet (50 mg total) by mouth 2 (two) times daily.  . nitroGLYCERIN (NITROSTAT) 0.4 MG SL tablet Place 1 tablet (0.4 mg total) under the tongue every 5 (five) minutes x 3 doses as needed for chest pain.  Marland Kitchen zolpidem (AMBIEN) 10 MG tablet Take 10 mg by mouth at bedtime as needed for sleep.      Allergies:   Patient has no known allergies.   Social History   Tobacco Use  . Smoking status: Former Smoker    Packs/day: 2.00    Years: 30.00    Pack years: 60.00    Last attempt to quit: 03/25/2017    Years since quitting: 1.7  . Smokeless tobacco: Never Used  Substance Use Topics  . Alcohol use: Yes  Alcohol/week: 3.0 standard drinks    Types: 1 Glasses of wine, 1 Cans of beer, 1 Shots of liquor per week    Comment: drinks daily  beer and whiskey  . Drug use: No     Family Hx: The patient's family history includes Hypertension in his brother, father, and mother.  ROS:   Please see the history of present illness.     All other systems reviewed and are negative.   Prior CV studies:   The following studies were reviewed today:  PCI 07/03/18 Orbital atherectomy + 2.5 x 15 mm Sierra DES to prox LAD  Cardiac Catheterization 06/30/18 LAD ost 25, prox 70 (DFR 0.71); D1 90 LCx mid stent patentdist 30; OM2 60; LPDA ost stent 40 ISR RCA mid 32; AM 50 EF 55-65  Cardiac Catheterization 09/20/17 LAD ost 25, prox 65, FFR 0.83 LCx mid stent patent, dist 30, OM2 60, LPDA ost 60, 90 RCA mid 50; AM 50 EF 55-65 PCI:  2.25 x 28 mm Synergy DES to LPDA  Echo 03/25/17 EF 60-65, no RWMA, PASP 39, trivial eff  LHC 03/25/17 LAD prox 50 LCx mid 99; OM1 50 RCA mid 50 PCI: 3.5 x 24 mm Promus Premier DES to mid LCx  Myoview 3/12 EF 65, atten artifact  Labs/Other  Tests and Data Reviewed:    EKG:  No ECG reviewed.  Recent Labs: 07/03/2018: B Natriuretic Peptide 239.8 07/04/2018: BUN 8; Creatinine, Ser 1.05; Hemoglobin 12.1; Platelets 116; Potassium 3.8; Sodium 139 09/29/2018: ALT 21   Recent Lipid Panel Lab Results  Component Value Date/Time   CHOL 118 09/29/2018 08:36 AM   TRIG 90 09/29/2018 08:36 AM   HDL 45 09/29/2018 08:36 AM   CHOLHDL 2.6 09/29/2018 08:36 AM   CHOLHDL 4.4 07/01/2018 03:34 AM   LDLCALC 55 09/29/2018 08:36 AM    From KPN Tool   Wt Readings from Last 3 Encounters:  12/19/18 196 lb 6.4 oz (89.1 kg)  09/29/18 198 lb 9.6 oz (90.1 kg)  08/28/18 195 lb 9.6 oz (88.7 kg)     Objective:    Vital Signs:  BP (!) 141/83   Ht 5\' 8"  (1.727 m)   Wt 196 lb 6.4 oz (89.1 kg)   BMI 29.86 kg/m    GEN:  Well nourished, well developed male in no acute distress. EYES: Anicteric RESP: Respiratory effort appears normal NEURO: Alert and oriented PSYCH: He appears to be in good spirits  ASSESSMENT & PLAN:    Coronary artery disease involving native coronary artery of native heart without angina pectoris History of inferoposterior ST elevation myocardial infarction in July 2018 treated with drug-eluting stent to the LCx, DES to the left PDA in January 2019 and non-ST elevation myocardial infarction in October 2019 treated with drug-eluting stent to the LAD.  He is currently doing well without anginal symptoms.  Continue aspirin, clopidogrel, atorvastatin, metoprolol.  Follow-up 6 months.  Essential hypertension Blood pressure somewhat above target today.  However, it is usually much better than this.  I have asked him to continue to monitor his blood pressure and notify us if it is >130/80 on a consistent basis.  Dyslipidemia, goal LDL below 70 LDL optimal on most recent lab work.  Continue current Rx.  He has participated in the AEGIS-II study.  COVID-19 Education: The signs and symptoms of COVID-19 were discussed with the  patient and how to seek care for testing (follow up with PCP or arrange E-visit).   The importance of social distancing  was discussed today.  Time:   Today, I have spent 15 minutes with the patient with telehealth technology discussing the above problems.     Medication Adjustments/Labs and Tests Ordered: Current medicines are reviewed at length with the patient today.  Concerns regarding medicines are outlined above.   Tests Ordered: None   Medication Changes: None   Disposition:  Follow up in 6 month(s)  Signed, Richardson Dopp, PA-C  12/19/2018 10:45 AM    [facility]

## 2018-12-19 ENCOUNTER — Encounter: Payer: Self-pay | Admitting: Physician Assistant

## 2018-12-19 ENCOUNTER — Other Ambulatory Visit: Payer: Self-pay

## 2018-12-19 ENCOUNTER — Telehealth (INDEPENDENT_AMBULATORY_CARE_PROVIDER_SITE_OTHER): Payer: 59 | Admitting: Physician Assistant

## 2018-12-19 VITALS — BP 141/83 | Ht 68.0 in | Wt 196.4 lb

## 2018-12-19 DIAGNOSIS — I10 Essential (primary) hypertension: Secondary | ICD-10-CM | POA: Diagnosis not present

## 2018-12-19 DIAGNOSIS — E785 Hyperlipidemia, unspecified: Secondary | ICD-10-CM

## 2018-12-19 DIAGNOSIS — Z7189 Other specified counseling: Secondary | ICD-10-CM

## 2018-12-19 DIAGNOSIS — I2510 Atherosclerotic heart disease of native coronary artery without angina pectoris: Secondary | ICD-10-CM | POA: Diagnosis not present

## 2018-12-19 MED ORDER — METOPROLOL TARTRATE 50 MG PO TABS: 50.0000 mg | ORAL_TABLET | Freq: Two times a day (BID) | ORAL | 3 refills | Status: DC

## 2018-12-19 MED ORDER — ATORVASTATIN CALCIUM 40 MG PO TABS: 40.0000 mg | ORAL_TABLET | Freq: Every day | ORAL | 3 refills | Status: DC

## 2018-12-19 NOTE — Patient Instructions (Signed)
Medication Instructions:  No changes.  If you need a refill on your cardiac medications before your next appointment, please call your pharmacy.   Lab work: None   If you have labs (blood work) drawn today and your tests are completely normal, you will receive your results only by: Marland Kitchen MyChart Message (if you have MyChart) OR . A paper copy in the mail If you have any lab test that is abnormal or we need to change your treatment, we will call you to review the results.  Testing/Procedures: None   Follow-Up: At [facility], you and your health needs are our priority.  As part of our continuing mission to provide you with exceptional heart care, we have created designated Provider Care Teams.  These Care Teams include your primary Cardiologist (physician) and Advanced Practice Providers (APPs -  Physician Assistants and Nurse Practitioners) who all work together to provide you with the care you need, when you need it. You will need a follow up appointment in:  6 months.  Please call our office 2 months in advance to schedule this appointment.  You may see Sherren Mocha, MD or Richardson Dopp, PA-C   Any Other Special Instructions Will Be Listed Below (If Applicable).   Keep an eye on your blood pressure and notify us if it is consistently > 130/80.

## 2018-12-21 ENCOUNTER — Telehealth (HOSPITAL_COMMUNITY): Payer: Self-pay

## 2018-12-21 NOTE — Telephone Encounter (Signed)
Called patient to verify email address. Patient says he is not able to exercise due to chronic hip pain. He was scheduled to have an injection in his hip but this was rescheduled due to COVID-19 restrictions. Informed patient that we were sending out videos of exercise ideas both standing and seated to help him stay active while we are closed. Patient says he is looking forward to getting the videos. Will follow up next week.

## 2018-12-25 ENCOUNTER — Ambulatory Visit: Payer: 59 | Admitting: Physician Assistant

## 2018-12-26 ENCOUNTER — Encounter: Payer: Self-pay | Admitting: *Deleted

## 2018-12-26 DIAGNOSIS — Z006 Encounter for examination for normal comparison and control in clinical research program: Secondary | ICD-10-CM

## 2018-12-26 NOTE — Research (Signed)
AEGIS Visit 9 phone call due to COVID 19   Pt doing well, no complaints of cp or sob. Only med change is with his metformin, no cardiac changes.                                     "CONSENT"   YES     NO   Continuing further Investigational Product and study visits for follow-up? [x]  []   Continuing consent from future biomedical research [x]  []                                      "EVENTS"    YES     NO  AE   (IF YES SEE SOURCE) []  [x]   SAE  (IF YES SEE SOURCE) []  [x]   ENDPOINT   (IF YES SEE SOURCE) []  [x]   REVASCULARIZATION  (IF YES SEE SOURCE) []  [x]   AMPUTATION   (IF YES SEE SOURCE) []  [x]   TROPONIN'S  (IF YES SEE SOURCE) []  [x]    Lifestyle Adherence Assessment:    YES NO  Abstinence from smoking/remaining tobacco free X   Cardiac Diet X   Routine physical activity and/or cardiac rehabilitation X     Current Outpatient Medications:  .  amLODipine (NORVASC) 5 MG tablet, TAKE 1 TABLET BY MOUTH EVERY DAY, Disp: 30 tablet, Rfl: 10 .  atorvastatin (LIPITOR) 40 MG tablet, Take 1 tablet (40 mg total) by mouth daily., Disp: 90 tablet, Rfl: 3 .  clopidogrel (PLAVIX) 75 MG tablet, TAKE 1 TABLET BY MOUTH EVERY DAY, Disp: 90 tablet, Rfl: 3 .  CVS ASPIRIN LOW STRENGTH 81 MG EC tablet, TAKE 1 TABLET BY MOUTH EVERY DAY, Disp: 90 tablet, Rfl: 3 .  hydrALAZINE (APRESOLINE) 50 MG tablet, TAKE 1 TABLET (50 MG TOTAL) BY MOUTH EVERY 8 (EIGHT) HOURS., Disp: 270 tablet, Rfl: 3 .  HYDROcodone-acetaminophen (NORCO) 7.5-325 MG tablet, Take 1 tablet by mouth every 6 (six) hours as needed for moderate pain., Disp: , Rfl:  .  isosorbide mononitrate (IMDUR) 30 MG 24 hr tablet, Take 0.5 tablets (15 mg total) by mouth daily., Disp: 45 tablet, Rfl: 2 .  losartan (COZAAR) 100 MG tablet, TAKE 1 TABLET BY MOUTH EVERY DAY, Disp: 90 tablet, Rfl: 3 .  metFORMIN (GLUCOPHAGE-XR) 500 MG 24 hr tablet, Take 500 mg by mouth every evening., Disp: , Rfl:  .  metoprolol tartrate (LOPRESSOR) 50 MG tablet, Take 1 tablet (50 mg  total) by mouth 2 (two) times daily., Disp: 180 tablet, Rfl: 3 .  nitroGLYCERIN (NITROSTAT) 0.4 MG SL tablet, Place 1 tablet (0.4 mg total) under the tongue every 5 (five) minutes x 3 doses as needed for chest pain., Disp: 25 tablet, Rfl: 11 .  zolpidem (AMBIEN) 10 MG tablet, Take 10 mg by mouth at bedtime as needed for sleep. , Disp: , Rfl:

## 2019-01-02 ENCOUNTER — Telehealth (HOSPITAL_COMMUNITY): Payer: Self-pay

## 2019-01-02 NOTE — Telephone Encounter (Signed)
Called patient to see how he is doing with his home exercise and to see if he would be interested in virtual rehab until we are open. He said he would be interested and he is doing what he can as far as exercise. He continues to have hip pain which impedes his progress. Will follow up in next few weeks.

## 2019-01-15 ENCOUNTER — Telehealth (HOSPITAL_COMMUNITY): Payer: Self-pay

## 2019-01-15 NOTE — Telephone Encounter (Signed)
Follow up call to check on patient and to inform him we are still closed but are working on getting the home based cardiac rehab going. Will follow up when we have the home base program ready.

## 2019-02-16 ENCOUNTER — Telehealth (HOSPITAL_COMMUNITY): Payer: Self-pay | Admitting: *Deleted

## 2019-02-16 NOTE — Telephone Encounter (Signed)
Called patient to check on him and to see if he plans on returning to the program when we re-open. There was no answer. Left message for patient to call us back.

## 2019-02-20 ENCOUNTER — Telehealth (HOSPITAL_COMMUNITY): Payer: Self-pay | Admitting: *Deleted

## 2019-02-20 NOTE — Telephone Encounter (Signed)
Called patient to check on him and to see if he plans on returning when we re-open. He does want to return. He does not want to do the virtual program.

## 2019-02-25 ENCOUNTER — Other Ambulatory Visit: Payer: Self-pay | Admitting: Cardiovascular Disease

## 2019-02-28 ENCOUNTER — Telehealth: Payer: Self-pay | Admitting: *Deleted

## 2019-02-28 NOTE — Telephone Encounter (Signed)
   [facility] Pre-operative Risk Assessment    Request for surgical clearance:  1. What type of surgery is being performed? RIGHT TOTAL HIP ARTHROPLASTY  2. When is this surgery scheduled? TBD  3. What type of clearance is required (medical clearance vs. Pharmacy clearance to hold med vs. Both)? MEDICAL  4. Are there any medications that need to be held prior to surgery and how long? PLAVIX and ASA  5. Practice name and name of physician performing surgery? [facility]; DR. Lyla Glassing  6. What is your office phone number [phone]   7.   What is your office fax number [phone]; PT STATES SURGEON OFFICE REQUEST FOR HIM TO BRING A HARD COPY OF SURGERY CLEARANCE TO THEIR OFFICE.  8.   Anesthesia type (None, local, MAC, general) ? SPINAL   Julaine Hua 02/28/2019, 12:48 PM  _________________________________________________________________   (provider comments below)

## 2019-02-28 NOTE — Telephone Encounter (Signed)
   Primary Cardiologist: Sherren Mocha, MD  Chart reviewed as part of pre-operative protocol coverage. Patient with hx of CAD and last cath 06/2018 as below:  CORONARY STENT INTERVENTION  CORONARY ATHERECTOMY  Conclusion    Prox LAD lesion is 70% stenosed.  A drug-eluting stent was successfully placed using a STENT SIERRA 2.50 X 15 MM.  Post intervention, there is a 0% residual stenosis.   1. Severe, heavily calcified mid LAD stenosis 2. Successful PTCA/orbital atherectomy/DES x 1 mid LAD  Recommendations:Likely discharge home tomorrow.  Recommend uninterrupted dual antiplatelet therapy with Aspirin 81mg  daily and Clopidogrel 75mg  daily for a minimum of 12 months (ACS - Class I recommendation).   Dr. Burt Knack, does patient need to wait until Oct 2020 for surgery or okay to hold antiplatelet therapy? The patient will need phone call if cleared for surgery.   Please forward your response to [facility] PREOP.   Thank you  Leanor Kail, PA 02/28/2019, 1:24 PM

## 2019-03-05 NOTE — Telephone Encounter (Signed)
Dr. Burt Knack, please see Vin's note below. Request for clearance for hip replacement. DES to LAD 06/2018. Should he wait til October to interrupt DAPT for surgery?  Please route response back to [facility] PREOP

## 2019-03-06 ENCOUNTER — Telehealth (HOSPITAL_COMMUNITY): Payer: Self-pay

## 2019-03-06 NOTE — Telephone Encounter (Signed)
   See exchange below. Awaiting input from Dr. Burt Knack about holding antiplatelet for hip surgery - dual antiplatelet therapy is supposed to be continued without interruption until 06/2019. Will fax this note as FYI to requesting surgeon so they are aware that we have reviewed clearance but patient may not yet be cleared to do so, pending MD decision. Dayna Dunn PA-C

## 2019-03-06 NOTE — Telephone Encounter (Signed)
   Primary Cardiologist: Sherren Mocha, MD  Chart reviewed as part of pre-operative protocol coverage. Patient was contacted 03/06/2019 in reference to pre-operative risk assessment for pending surgery as outlined below.  [name] Foster was last seen on 12/19/18 by Richardson Dopp via video visit. He has history of CAD (MI 03/2017 s/p PCI, angina 09/2017 s/p PCI, and [place] 06/2018 s/p PTCA/DES) with DM, HTN, HLD, preserved LVEF. He affirms he is doing well without any new cardiac symptoms. He has remained physically active albeit a little less than usual due to hip pain. He is still able to exercise over 4 METS without any angina or SOB. Therefore, based on ACC/AHA guidelines, the patient would be at acceptable risk for the planned procedure without further cardiovascular testing.   Per Dr. Burt Knack, "I think since he is > 6 months out from PCI he can hold plavix as requested for surgery. Ideally he would stay on ASA 81 mg perioperatively if his bleeding risk is not too high."  I will route this recommendation to the requesting party via Epic fax function and remove from pre-op pool.  Please call with questions.  Charlie Pitter, PA-C 03/06/2019, 4:06 PM

## 2019-03-06 NOTE — Telephone Encounter (Signed)
It doesn't appear that he had true ACS in October 2019 as troponin was negative at that time. I think since he is > 6 months out from PCI he can hold plavix as requested for surgery. Ideally he would stay on ASA 81 mg perioperatively if his bleeding risk is not too high.

## 2019-03-06 NOTE — Telephone Encounter (Signed)
Called patient to inform him of our reopen date in Cardiac Rehab of Monday 03/12/19 and to share some of the details about how we will be operating our program and what time to arrive. He verbalized understanding and shared that he is waiting for hip replacement surgery and will not be able to do anything with his legs but would like to come finish the program using the ergometer. I informed him we would be okay with this. He has 14 sessions left to complete and he should be finished before August. Patient was in agreement.

## 2019-03-12 ENCOUNTER — Encounter (HOSPITAL_COMMUNITY)
Admission: RE | Admit: 2019-03-12 | Discharge: 2019-03-12 | Disposition: A | Discharge status: Home / Self Care | Payer: 59 | Source: Ambulatory Visit | Attending: Cardiology | Admitting: Cardiology

## 2019-03-12 ENCOUNTER — Other Ambulatory Visit: Payer: Self-pay

## 2019-03-12 DIAGNOSIS — Z955 Presence of coronary angioplasty implant and graft: Secondary | ICD-10-CM | POA: Insufficient documentation

## 2019-03-12 DIAGNOSIS — I214 Non-ST elevation (NSTEMI) myocardial infarction: Secondary | ICD-10-CM | POA: Insufficient documentation

## 2019-03-12 NOTE — Progress Notes (Signed)
Daily Session Note  Patient Details  Name: [name] Foster MRN: [id number] Date of Birth: 02-19-1956 Referring Provider:     CARDIAC REHAB PHASE II ORIENTATION from 08/28/2018 in [place]  Referring Provider  Branch       Encounter Date: 03/12/2019  Check In: Session Check In - 03/12/19 0930      Check-In   Staff Present  Diane Angelina Pih, MS, EP, [facility], Exercise Physiologist;Elka Satterfield Wynetta Emery, RN, [facility] Munch, Exercise Physiologist    Virtual Visit  No    Medication changes reported      No    Fall or balance concerns reported     No    Tobacco Cessation  No Change    Warm-up and Cool-down  Performed as group-led instruction    Resistance Training Performed  No    VAD Patient?  No    PAD/SET Patient?  No      Pain Assessment   Currently in Pain?  No/denies    Pain Score  0-No pain    Multiple Pain Sites  No       Capillary Blood Glucose: No results found for this or any previous visit (from the past 24 hour(s)).    Social History   Tobacco Use  Smoking Status Former Smoker  . Packs/day: 2.00  . Years: 30.00  . Pack years: 60.00  . Quit date: 03/25/2017  . Years since quitting: 1.9  Smokeless Tobacco Never Used    Goals Met:  Independence with exercise equipment Exercise tolerated well No report of cardiac concerns or symptoms Strength training completed today  Goals Unmet:  Not Applicable  Comments: Pt able to follow exercise prescription today without complaint.  Will continue to monitor for progression. Check out 1030.   Dr. Kate Sable is Medical Director for [facility] Cardiac and Pulmonary Rehab.

## 2019-03-14 ENCOUNTER — Encounter (HOSPITAL_COMMUNITY)
Admission: RE | Admit: 2019-03-14 | Discharge: 2019-03-14 | Disposition: A | Discharge status: Home / Self Care | Payer: 59 | Source: Ambulatory Visit | Attending: Cardiology | Admitting: Cardiology

## 2019-03-14 ENCOUNTER — Other Ambulatory Visit: Payer: Self-pay

## 2019-03-14 DIAGNOSIS — Z955 Presence of coronary angioplasty implant and graft: Secondary | ICD-10-CM

## 2019-03-14 DIAGNOSIS — I214 Non-ST elevation (NSTEMI) myocardial infarction: Secondary | ICD-10-CM | POA: Insufficient documentation

## 2019-03-14 NOTE — Progress Notes (Signed)
Daily Session Note  Patient Details  Name: [name] Foster MRN: [id number] Date of Birth: 09-Nov-1955 Referring Provider:     CARDIAC REHAB PHASE II ORIENTATION from 08/28/2018 in [place]  Referring Provider  Branch       Encounter Date: 03/14/2019  Check In: Session Check In - 03/14/19 0929      Check-In   Supervising physician immediately available to respond to emergencies  See telemetry face sheet for immediately available MD    Location  [facility]    Staff Present  Russella Dar, MS, EP, [facility], Exercise Physiologist;Kaesha Kirsch Wynetta Emery, RN, [facility], Exercise Physiologist    Virtual Visit  No    Medication changes reported      No    Fall or balance concerns reported     No    Tobacco Cessation  No Change    Warm-up and Cool-down  Performed as group-led instruction    Resistance Training Performed  No    VAD Patient?  No    PAD/SET Patient?  No      Pain Assessment   Currently in Pain?  No/denies    Pain Score  0-No pain    Multiple Pain Sites  No       Capillary Blood Glucose: No results found for this or any previous visit (from the past 24 hour(s)).    Social History   Tobacco Use  Smoking Status Former Smoker  . Packs/day: 2.00  . Years: 30.00  . Pack years: 60.00  . Quit date: 03/25/2017  . Years since quitting: 1.9  Smokeless Tobacco Never Used    Goals Met:  Independence with exercise equipment Exercise tolerated well No report of cardiac concerns or symptoms Strength training completed today  Goals Unmet:  Not Applicable  Comments: Pt able to follow exercise prescription today without complaint.  Will continue to monitor for progression. Check out 1030.   Dr. Kate Sable is Medical Director for [facility] Cardiac and Pulmonary Rehab.

## 2019-03-16 ENCOUNTER — Encounter (HOSPITAL_COMMUNITY): Payer: 59

## 2019-03-19 ENCOUNTER — Encounter (HOSPITAL_COMMUNITY)
Admission: RE | Admit: 2019-03-19 | Discharge: 2019-03-19 | Disposition: A | Discharge status: Home / Self Care | Payer: 59 | Source: Ambulatory Visit | Attending: Cardiovascular Disease | Admitting: Cardiovascular Disease

## 2019-03-19 ENCOUNTER — Other Ambulatory Visit: Payer: Self-pay

## 2019-03-19 ENCOUNTER — Telehealth: Payer: Self-pay | Admitting: Cardiovascular Disease

## 2019-03-19 DIAGNOSIS — I214 Non-ST elevation (NSTEMI) myocardial infarction: Secondary | ICD-10-CM

## 2019-03-19 DIAGNOSIS — Z955 Presence of coronary angioplasty implant and graft: Secondary | ICD-10-CM

## 2019-03-19 NOTE — Telephone Encounter (Signed)
Patient called today to inform us that he was given a restriction for his hip replacement surgery. He is restricted to not have the procedure until October 2020. He was not certain which office posted that restriction. If it was our office, he would like to make an appointment to see one of the providers to get that restriction lifted. He states he is in a great deal of pain, and can not wait until October to have the procedure done. If it was not our office that placed the restriction on him, please reach out to the patient and let him know, so he can contact another provider.

## 2019-03-19 NOTE — Progress Notes (Signed)
Daily Session Note  Patient Details  Name: [name] Foster MRN: [id number] Date of Birth: 1956-05-27 Referring Provider:     CARDIAC REHAB PHASE II ORIENTATION from 08/28/2018 in [place]  Referring Provider  Branch       Encounter Date: 03/19/2019  Check In: Session Check In - 03/19/19 0930      Check-In   Supervising physician immediately available to respond to emergencies  See telemetry face sheet for immediately available MD    Location  [facility]    Staff Present  Russella Dar, MS, EP, [facility], Exercise Physiologist;Amanda Ballard, Exercise Physiologist;Maryiah Olvey Wynetta Emery, RN, BSN    Virtual Visit  No    Medication changes reported      No    Fall or balance concerns reported     No    Tobacco Cessation  No Change    Warm-up and Cool-down  Performed as group-led instruction    Resistance Training Performed  No    VAD Patient?  No    PAD/SET Patient?  No      Pain Assessment   Currently in Pain?  No/denies    Pain Score  0-No pain    Multiple Pain Sites  No       Capillary Blood Glucose: No results found for this or any previous visit (from the past 24 hour(s)).    Social History   Tobacco Use  Smoking Status Former Smoker  . Packs/day: 2.00  . Years: 30.00  . Pack years: 60.00  . Quit date: 03/25/2017  . Years since quitting: 1.9  Smokeless Tobacco Never Used    Goals Met:  Independence with exercise equipment Exercise tolerated well No report of cardiac concerns or symptoms Strength training completed today  Goals Unmet:  Not Applicable  Comments: Pt able to follow exercise prescription today without complaint.  Will continue to monitor for progression. Check out 1030.   Dr. Kate Sable is Medical Director for [facility] Cardiac and Pulmonary Rehab.

## 2019-03-19 NOTE — Telephone Encounter (Signed)
I have spoken with the patient and called [facility], apparently the clearance letter that was previously received by them was incomplete, it did not have Dr. Antionette Char recommendation attached. The last note [facility] scheduler saw was "See exchange below. Awaiting input from Dr. Burt Knack about holding antiplatelet for hip surgery - dual antiplatelet therapy is supposed to be continued without interruption until 06/2019." Therefore they were under the impression the surgery need to be delayed. I have spoken with the [facility] scheduler about Dr. Antionette Char final clearance and also refaxed the last clearance to them. They plan to get in touch with the patient to arrange the procedure.

## 2019-03-20 ENCOUNTER — Ambulatory Visit: Payer: Self-pay | Admitting: Orthopedic Surgery

## 2019-03-21 ENCOUNTER — Encounter (HOSPITAL_COMMUNITY)
Admission: RE | Admit: 2019-03-21 | Discharge: 2019-03-21 | Disposition: A | Discharge status: Home / Self Care | Payer: 59 | Source: Ambulatory Visit | Attending: Cardiovascular Disease | Admitting: Cardiovascular Disease

## 2019-03-21 ENCOUNTER — Other Ambulatory Visit: Payer: Self-pay

## 2019-03-21 DIAGNOSIS — Z955 Presence of coronary angioplasty implant and graft: Secondary | ICD-10-CM

## 2019-03-21 DIAGNOSIS — I214 Non-ST elevation (NSTEMI) myocardial infarction: Secondary | ICD-10-CM

## 2019-03-21 NOTE — Progress Notes (Signed)
Daily Session Note  Patient Details  Name: STEVENS MAGWOOD MRN: [id number] Date of Birth: 07-14-1956 Referring Provider:     CARDIAC REHAB PHASE II ORIENTATION from 08/28/2018 in [place]  Referring Provider  Branch       Encounter Date: 03/21/2019  Check In: Session Check In - 03/21/19 0930      Check-In   Supervising physician immediately available to respond to emergencies  See telemetry face sheet for immediately available MD    Location  [facility]    Staff Present  Russella Dar, MS, EP, [facility], Exercise Physiologist;Debra Wynetta Emery, RN, [facility], Exercise Physiologist    Virtual Visit  No    Medication changes reported      No    Fall or balance concerns reported     No    Tobacco Cessation  No Change    Warm-up and Cool-down  Performed as group-led instruction    Resistance Training Performed  Yes    VAD Patient?  No    PAD/SET Patient?  No      Pain Assessment   Currently in Pain?  No/denies    Pain Score  0-No pain    Multiple Pain Sites  No       Capillary Blood Glucose: No results found for this or any previous visit (from the past 24 hour(s)).    Social History   Tobacco Use  Smoking Status Former Smoker  . Packs/day: 2.00  . Years: 30.00  . Pack years: 60.00  . Quit date: 03/25/2017  . Years since quitting: 1.9  Smokeless Tobacco Never Used    Goals Met:  Independence with exercise equipment Exercise tolerated well Personal goals reviewed No report of cardiac concerns or symptoms Strength training completed today  Goals Unmet:  Not Applicable  Comments: Check out: 1030   Dr. Kate Sable is Medical Director for [place] and Pulmonary Rehab.

## 2019-03-23 ENCOUNTER — Encounter (HOSPITAL_COMMUNITY)
Admission: RE | Admit: 2019-03-23 | Discharge: 2019-03-23 | Disposition: A | Discharge status: Home / Self Care | Payer: 59 | Source: Ambulatory Visit | Attending: Cardiovascular Disease | Admitting: Cardiovascular Disease

## 2019-03-23 ENCOUNTER — Other Ambulatory Visit: Payer: Self-pay

## 2019-03-23 DIAGNOSIS — I214 Non-ST elevation (NSTEMI) myocardial infarction: Secondary | ICD-10-CM

## 2019-03-23 DIAGNOSIS — Z955 Presence of coronary angioplasty implant and graft: Secondary | ICD-10-CM

## 2019-03-23 NOTE — Progress Notes (Signed)
Daily Session Note  Patient Details  Name: [name] Foster MRN: [id number] Date of Birth: Jan 23, 1956 Referring Provider:     CARDIAC REHAB PHASE II ORIENTATION from 08/28/2018 in [place]  Referring Provider  Branch       Encounter Date: 03/23/2019  Check In: Session Check In - 03/23/19 0930      Check-In   Supervising physician immediately available to respond to emergencies  See telemetry face sheet for immediately available MD    Location  [facility]    Staff Present  Russella Dar, MS, EP, [facility], Exercise Physiologist;Iesha Summerhill Wynetta Emery, RN, [facility] Munch, Exercise Physiologist    Virtual Visit  No    Medication changes reported      No    Fall or balance concerns reported     No    Tobacco Cessation  No Change    Warm-up and Cool-down  Performed as group-led instruction    Resistance Training Performed  Yes    VAD Patient?  No    PAD/SET Patient?  No      Pain Assessment   Currently in Pain?  No/denies    Pain Score  0-No pain    Multiple Pain Sites  No       Capillary Blood Glucose: No results found for this or any previous visit (from the past 24 hour(s)).    Social History   Tobacco Use  Smoking Status Former Smoker  . Packs/day: 2.00  . Years: 30.00  . Pack years: 60.00  . Quit date: 03/25/2017  . Years since quitting: 1.9  Smokeless Tobacco Never Used    Goals Met:  Independence with exercise equipment Exercise tolerated well No report of cardiac concerns or symptoms Strength training completed today  Goals Unmet:  Not Applicable  Comments: Pt able to follow exercise prescription today without complaint.  Will continue to monitor for progression. Check out 1030.   Dr. Kate Sable is Medical Director for [facility] Cardiac and Pulmonary Rehab.

## 2019-03-26 ENCOUNTER — Encounter (HOSPITAL_COMMUNITY)
Admission: RE | Admit: 2019-03-26 | Discharge: 2019-03-26 | Disposition: A | Discharge status: Home / Self Care | Payer: 59 | Source: Ambulatory Visit | Attending: Cardiovascular Disease | Admitting: Cardiovascular Disease

## 2019-03-26 ENCOUNTER — Other Ambulatory Visit: Payer: Self-pay

## 2019-03-26 DIAGNOSIS — I214 Non-ST elevation (NSTEMI) myocardial infarction: Secondary | ICD-10-CM | POA: Diagnosis not present

## 2019-03-26 DIAGNOSIS — Z955 Presence of coronary angioplasty implant and graft: Secondary | ICD-10-CM

## 2019-03-26 NOTE — Progress Notes (Addendum)
Daily Session Note  Patient Details  Name: CODEN FRANCHI MRN: [id number] Date of Birth: [birth year] Referring Provider:     CARDIAC REHAB PHASE II ORIENTATION from 08/28/2018 in [place]  Referring Provider  Branch       Encounter Date: 03/26/2019  Check In: Session Check In - 03/26/19 0930      Check-In   Supervising physician immediately available to respond to emergencies  See telemetry face sheet for immediately available MD    Location  [facility]    Staff Present  Russella Dar, MS, EP, [facility], Exercise Physiologist;Zymiere Trostle Wynetta Emery, RN, [facility] Munch, Exercise Physiologist    Virtual Visit  No    Medication changes reported      No    Fall or balance concerns reported     No    Tobacco Cessation  No Change    Warm-up and Cool-down  Performed on first and last piece of equipment    Resistance Training Performed  No    VAD Patient?  No    PAD/SET Patient?  No      Pain Assessment   Currently in Pain?  No/denies    Pain Score  0-No pain    Multiple Pain Sites  No       Capillary Blood Glucose: No results found for this or any previous visit (from the past 24 hour(s)).    Social History   Tobacco Use  Smoking Status Former Smoker  . Packs/day: 2.00  . Years: 30.00  . Pack years: 60.00  . Quit date: 03/25/2017  . Years since quitting: 2.0  Smokeless Tobacco Never Used    Goals Met:  Independence with exercise equipment Exercise tolerated well No report of cardiac concerns or symptoms Strength training completed today  Goals Unmet:  Not Applicable  Comments: Pt able to follow exercise prescription today without complaint.  Will continue to monitor for progression. Check out 1030.   Dr. Kate Sable is Medical Director for [facility] Cardiac and Pulmonary Rehab.

## 2019-03-28 ENCOUNTER — Encounter (HOSPITAL_COMMUNITY): Payer: 59

## 2019-03-29 ENCOUNTER — Other Ambulatory Visit: Payer: Self-pay | Admitting: Cardiovascular Disease

## 2019-03-30 ENCOUNTER — Other Ambulatory Visit: Payer: Self-pay | Admitting: Cardiovascular Disease

## 2019-03-30 ENCOUNTER — Encounter (HOSPITAL_COMMUNITY): Payer: 59

## 2019-04-02 ENCOUNTER — Encounter (HOSPITAL_COMMUNITY): Payer: 59

## 2019-04-04 ENCOUNTER — Encounter (HOSPITAL_COMMUNITY): Payer: 59

## 2019-04-04 NOTE — Progress Notes (Signed)
Discharge Progress Report  Patient Details  Name: [name] Foster MRN: [id number] Date of Birth: 1956/05/24 Referring Provider:     CARDIAC REHAB PHASE II ORIENTATION from 08/28/2018 in [place]  Referring Provider  Branch        Number of Visits: 29  Reason for Discharge:  Early Exit:  Hip replacement surgery.  Smoking History:  Social History   Tobacco Use  Smoking Status Former Smoker  . Packs/day: 2.00  . Years: 30.00  . Pack years: 60.00  . Quit date: 03/25/2017  . Years since quitting: 2.0  Smokeless Tobacco Never Used    Diagnosis:  NSTEMI (non-ST elevated myocardial infarction) ([facility])  Status post coronary artery stent placement  ADL UCSD:   Initial Exercise Prescription:   Discharge Exercise Prescription (Final Exercise Prescription Changes): Exercise Prescription Changes - 03/27/19 1000      Response to Exercise   Blood Pressure (Admit)  122/62    Blood Pressure (Exercise)  130/70    Blood Pressure (Exit)  100/62    Heart Rate (Admit)  69 bpm    Heart Rate (Exercise)  92 bpm    Heart Rate (Exit)  77 bpm    Rating of Perceived Exertion (Exercise)  12    Comments  first 2 weeks back     Duration  Continue with 30 min of aerobic exercise without signs/symptoms of physical distress.    Intensity  THRR unchanged      Progression   Progression  Continue to progress workloads to maintain intensity without signs/symptoms of physical distress.    Average METs  3.2      Resistance Training   Training Prescription  Yes    Weight  4    Reps  10-15      Treadmill   MPH  --   needs hip replacement so switched to seated equipment      Arm Ergometer   Level  2.5    Watts  30    Minutes  17    METs  3.5      Arm/Foot Ergometer   Level  2.5    Watts  24    Minutes  22    METs  2.9      Home Exercise Plan   Plans to continue exercise at  Home (comment)    Frequency  Add 2 additional days to program exercise sessions.     Initial Home Exercises Provided  08/28/18       Functional Capacity:   Psychological, QOL, Others - Outcomes: PHQ 2/9: Depression screen [facility] 2/9 08/28/2018 02/27/2018 10/25/2017 04/26/2017  Decreased Interest 0 0 0 0  Down, Depressed, Hopeless 0 0 0 0  PHQ - 2 Score 0 0 0 0  Altered sleeping 1 1 1  -  Tired, decreased energy 3 1 1  -  Change in appetite 1 1 0 -  Feeling bad or failure about yourself  0 0 0 -  Trouble concentrating 0 0 0 -  Moving slowly or fidgety/restless 0 0 0 -  Suicidal thoughts 0 0 0 -  PHQ-9 Score 5 3 2  -  Difficult doing work/chores Not difficult at all Not difficult at all Not difficult at all -    Quality of Life:   Personal Goals: Goals established at orientation with interventions provided to work toward goal.    Personal Goals Discharge: Goals and Risk Factor Review    Row Name 10/18/18 1312 11/15/18 1420 12/04/18 0855  Core Components/Risk Factors/Patient Goals Review   Personal Goals Review  Weight Management/Obesity;Diabetes Regain health; lose 25 lbs.   Weight Management/Obesity;Diabetes Regain health; lose 25 lbs.   Weight Management/Obesity;Diabetes Regain health; lose 25 lbs.      Review  Patient has completed 14 sessions maintaining his weight since last 30 day review. He continues to do well in the program with progression. He says he feels like he is getting his heart back in heart healthy shape and feels his stamina is improving. His last A1C was 6 months ago at 6.2. His blood pressue is controlled. Will continue to monitor for progress.   Patient has completed 19 sessions losing 2 lbs since last 30 day review. He continues to do well in the program with progression. He states the program has helped him get back into a healthy exercise habit and he has learned a lot from the education classes. He has not had an A1C recently. His reported fasting glucose readings are averaging 130 mg/dl. Will continue to monitor for progress.   Patient  has completed 22 sessions gaing 5 lbs since last 30 day review. He continues to do well in the program with progression. He says he has a better mental outlook now that he is back exercising and feels stronger. His last A1C was one month ago at 6.2 mg/dl. His fasting glucose readings are averaging 120 mg/dl. Outpatient cardiac rehab servises has been suspended due to the COVID-19 restrictions. Will continue to montior for progress.      Expected Outcomes  Patient will continue to attend sessions and complete the program meeting his personal goals.   Patient will continue to attend sessions and complete the program meeting his personal goals.   Patient will continue to attend sessions and complete the program meeting his personal goals.         Exercise Goals and Review:   Exercise Goals Re-Evaluation: Exercise Goals Re-Evaluation    Row Name 10/16/18 1446 11/13/18 1442 12/04/18 0814         Exercise Goal Re-Evaluation   Exercise Goals Review  Increase Physical Activity;Increase Strength and Stamina;Able to understand and use rate of perceived exertion (RPE) scale;Knowledge and understanding of Target Heart Rate Range (THRR);Able to check pulse independently;Understanding of Exercise Prescription  Increase Physical Activity;Increase Strength and Stamina;Able to understand and use rate of perceived exertion (RPE) scale;Knowledge and understanding of Target Heart Rate Range (THRR);Able to check pulse independently;Understanding of Exercise Prescription  Increase Physical Activity;Increase Strength and Stamina;Able to understand and use rate of perceived exertion (RPE) scale;Knowledge and understanding of Target Heart Rate Range (THRR);Able to check pulse independently;Understanding of Exercise Prescription     Comments  Pt. continues to progess well with his exercise. He is up to 2.8 MPH on the TM. He feels like he is getting stronger and is ready to be back to the workloads he was using prior to his  heart event.   Pt. returned after 2 weeks off for a death in his family. He continues to do well in the program and is tolerating all the exercise with ease.   Pt. is still doing well in CR when he is consistantly coming. It will help him to better develop a routine and see improvements when he starts to show more regularly. He continues to tolerate the exercise well and is eager to get back to maintenance.      Expected Outcomes  to lose weight, his goal is 25 pounds   to lose  weight, his goal is 25 pounds   lose weight, his goal is 25 pounds         Nutrition & Weight - Outcomes:    Nutrition: Nutrition Therapy & Goals - 12/04/18 0854      Nutrition Therapy   RD appointment deferred  Yes      Personal Nutrition Goals   Comments  Patient has not meet with RD. This is his 2nd time in the program and he meet with him the 1st time. He says he has not changed his diet since starting the program. He follows a diabetic, low NA, low fat diet. Will continue to monitor.       Intervention Plan   Intervention  Nutrition handout(s) given to patient.       Nutrition Discharge:   Education Questionnaire Score:

## 2019-04-04 NOTE — Progress Notes (Addendum)
Cardiac Individual Treatment Plan  Patient Details  Name: [name] Foster MRN: [id number] Date of Birth: 1955-12-25 Referring Provider:     CARDIAC REHAB PHASE II ORIENTATION from 08/28/2018 in [place]  Referring Provider  Branch       Initial Encounter Date:    CARDIAC REHAB PHASE II ORIENTATION from 08/28/2018 in [place]  Date  08/28/18      Visit Diagnosis: NSTEMI (non-ST elevated myocardial infarction) [facility])   Status post coronary artery stent placement   Patient's Home Medications on Admission:  Current Outpatient Medications:  .  amLODipine (NORVASC) 5 MG tablet, TAKE 1 TABLET BY MOUTH EVERY DAY, Disp: 30 tablet, Rfl: 10 .  atorvastatin (LIPITOR) 40 MG tablet, Take 1 tablet (40 mg total) by mouth daily., Disp: 90 tablet, Rfl: 3 .  clopidogrel (PLAVIX) 75 MG tablet, TAKE 1 TABLET BY MOUTH EVERY DAY, Disp: 90 tablet, Rfl: 3 .  [facility] ASPIRIN LOW STRENGTH 81 MG EC tablet, TAKE 1 TABLET BY MOUTH EVERY DAY, Disp: 90 tablet, Rfl: 3 .  hydrALAZINE (APRESOLINE) 50 MG tablet, TAKE 1 TABLET (50 MG TOTAL) BY MOUTH EVERY 8 (EIGHT) HOURS., Disp: 270 tablet, Rfl: 3 .  HYDROcodone-acetaminophen (NORCO) 7.5-325 MG tablet, Take 1 tablet by mouth every 6 (six) hours as needed for moderate pain., Disp: , Rfl:  .  isosorbide mononitrate (IMDUR) 30 MG 24 hr tablet, TAKE 0.5 TABLETS (15 MG TOTAL) BY MOUTH DAILY., Disp: 45 tablet, Rfl: 2 .  losartan (COZAAR) 50 MG tablet, TAKE 2 TABLETS (100MG TOTAL DOSE) BY MOUTH EVERY DAY, Disp: 180 tablet, Rfl: 1 .  metFORMIN (GLUCOPHAGE-XR) 500 MG 24 hr tablet, Take 500 mg by mouth every evening., Disp: , Rfl:  .  metoprolol tartrate (LOPRESSOR) 50 MG tablet, Take 1 tablet (50 mg total) by mouth 2 (two) times daily., Disp: 180 tablet, Rfl: 3 .  nitroGLYCERIN (NITROSTAT) 0.4 MG SL tablet, Place 1 tablet (0.4 mg total) under the tongue every 5 (five) minutes x 3 doses as needed for chest pain., Disp: 25 tablet, Rfl:  11 .  zolpidem (AMBIEN) 10 MG tablet, Take 10 mg by mouth at bedtime as needed for sleep. , Disp: , Rfl:   Past Medical History: Past Medical History:  Diagnosis Date  . CAD (coronary artery disease)    a. 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 DES to left PDA 09/20/17 // c. 10/19: LCx and LPDA stents ok; s/p DES to pLAD and POBA to D1  . Diabetes mellitus without complication ([place])   . Hyperlipidemia   . Hypertension   . Insomnia   . Pulmonary hypertension ([place])    a. TTE 03/25/17; EF 60-65%, nl WM, LV diastolic fxn nl, trivial AI, RV cavity size, wall thickness, and sys fxn nl, trivial TR, PASP 39 mmHg, trivial pericardial effusion  . Sleep apnea    could not use a cpap  . Wears dentures    full top-partial bottom  . Wears glasses     Tobacco Use: Social History   Tobacco Use  Smoking Status Former Smoker  . Packs/day: 2.00  . Years: 30.00  . Pack years: 60.00  . Quit date: 03/25/2017  . Years since quitting: 2.0  Smokeless Tobacco Never Used    Labs: Recent Review [facility] for [facility] Rehab  Latest Ref Rng & Units 03/25/2017 05/13/2017 07/01/2018 08/24/2018 09/29/2018   Cholestrol 100 - 199 mg/dL 163 81(L) 151 111 118   LDLCALC 0 - 99 mg/dL 92 41 81 51 55   HDL >39 mg/dL 25(L) 24(L) 34(L) 45 45   Trlycerides 0 - 149 mg/dL 229(H) 82 180(H) 73 90   Hemoglobin A1c 4.8 - 5.6 % 9.4(H) - - - -   PHART 7.350 - 7.450 7.206(L) - - - -   PCO2ART 32.0 - 48.0 mmHg 53.8(H) - - - -   HCO3 20.0 - 28.0 mmol/L 21.3 - - - -   TCO2 0 - 100 mmol/L 23 - - - -   ACIDBASEDEF 0.0 - 2.0 mmol/L 7.0(H) - - - -   O2SAT % 99.0 - - - -      Capillary Blood Glucose: Lab Results  Component Value Date   GLUCAP 120 (H) 07/04/2018   GLUCAP 113 (H) 07/04/2018   GLUCAP 170 (H) 07/03/2018   GLUCAP 98 07/03/2018   GLUCAP 102 (H) 07/03/2018      Exercise Target Goals: Exercise Program Goal: Individual exercise prescription set using results from initial 6 min walk test and THRR while considering  patient's activity barriers and safety.   Exercise Prescription Goal: Starting with aerobic activity 30 plus minutes a day, 3 days per week for initial exercise prescription. Provide home exercise prescription and guidelines that participant acknowledges understanding prior to discharge.  Activity Barriers & Risk Stratification:   6 Minute Walk:   Oxygen Initial Assessment:   Oxygen Re-Evaluation:   Oxygen Discharge (Final Oxygen Re-Evaluation):   Initial Exercise Prescription:   Perform Capillary Blood Glucose checks as needed.  Exercise Prescription Changes:  Exercise Prescription Changes    Row Name 10/10/18 0800 10/26/18 1200 11/07/18 0800 11/20/18 1500 12/04/18 0800     Response to Exercise   Blood Pressure (Admit)  118/60  150/62  140/60  128/74  172/80   Blood Pressure (Exercise)  130/70  160/60  140/62  150/68  146/84   Blood Pressure (Exit)  112/60  136/70  120/74  120/70  140/60   Heart Rate (Admit)  63 bpm  63 bpm  58 bpm  59 bpm  60 bpm   Heart Rate (Exercise)  106 bpm  116 bpm  83 bpm  93 bpm  89 bpm   Heart Rate (Exit)  71 bpm  75 bpm  81 bpm  68 bpm  67 bpm   Rating of Perceived Exertion (Exercise)  '12  12  12  12  12   ' Comments  first two weeks of exercise   increase in overall MET level   returned after 2 weeks out due to a death in the family.   -  -   Duration  Continue with 30 min of aerobic exercise without signs/symptoms of physical distress.  Continue with 30 min of aerobic exercise without signs/symptoms of physical distress.  Continue with 30 min of aerobic exercise without signs/symptoms of physical distress.  Continue with 30 min of aerobic exercise without signs/symptoms of physical distress.  Continue with 30 min of aerobic exercise without signs/symptoms of physical distress.    Intensity  THRR unchanged  THRR unchanged  THRR unchanged  THRR unchanged  THRR unchanged     Progression   Progression  Continue to progress workloads to maintain intensity without signs/symptoms of physical distress.  Continue to progress workloads to maintain intensity without signs/symptoms of physical distress.  Continue to  progress workloads to maintain intensity without signs/symptoms of physical distress.  Continue to progress workloads to maintain intensity without signs/symptoms of physical distress.  Continue to progress workloads to maintain intensity without signs/symptoms of physical distress.   Average METs  4.09  4.87  4.82  4.39  4.63     Resistance Training   Training Prescription  Yes  Yes  Yes  Yes  Yes   Weight  '4  4  4  4  4   ' Reps  10-15  10-15  10-15  10-15  10-15     Treadmill   MPH  2.6  2.8  2.8  3  3.1   Grade  0  0  0  0  0.5   Minutes  '17  17  17  17  17   ' METs  2.98  3.14  3.14  3.29  3.56     Recumbant Elliptical   Level  '2  2  2  3  3   ' RPM  72  72  85  64  59   Watts  112  118  112  101  101   Minutes  '22  22  22  22  22   ' METs  5.2  6.6  6.5  5.5  2.7     Home Exercise Plan   Plans to continue exercise at  Home (comment)  Home (comment)  Home (comment)  Home (comment)  Home (comment)   Frequency  Add 2 additional days to program exercise sessions.  Add 2 additional days to program exercise sessions.  Add 2 additional days to program exercise sessions.  Add 2 additional days to program exercise sessions.  Add 2 additional days to program exercise sessions.   Initial Home Exercises Provided  08/28/18  08/28/18  08/28/18  08/28/18  08/28/18   Row Name 03/27/19 1000             Response to Exercise   Blood Pressure (Admit)  122/62       Blood Pressure (Exercise)  130/70       Blood Pressure (Exit)  100/62       Heart Rate (Admit)  69 bpm       Heart Rate (Exercise)  92 bpm       Heart Rate (Exit)  77 bpm       Rating of Perceived Exertion  (Exercise)  12       Comments  first 2 weeks back        Duration  Continue with 30 min of aerobic exercise without signs/symptoms of physical distress.       Intensity  THRR unchanged         Progression   Progression  Continue to progress workloads to maintain intensity without signs/symptoms of physical distress.       Average METs  3.2         Resistance Training   Training Prescription  Yes       Weight  4       Reps  10-15         Treadmill   MPH  - needs hip replacement so switched to seated equipment          Arm Ergometer   Level  2.5       Watts  30       Minutes  17       METs  3.5  Arm/Foot Ergometer   Level  2.5       Watts  24       Minutes  22       METs  2.9         Home Exercise Plan   Plans to continue exercise at  Home (comment)       Frequency  Add 2 additional days to program exercise sessions.       Initial Home Exercises Provided  08/28/18          Exercise Comments:  Exercise Comments    Row Name 10/16/18 1447 10/26/18 1247 11/13/18 1443 12/04/18 0816 03/27/19 1024   Exercise Comments  Pt. has attended 14 exercise sessions so far. He has tolerated them all with ease and is eager to be able to get back to the levels he was using before his event.   Pt. has attended 15 sessions. His attendance has been spotty due to sickness in the family.   Pt. has attended 18 sessions so far. He is now back more regularly after having to struggle with a death in his family. He is tolerating all of the exercise well. He is now walking at 3.0 MPH on the TM. We will continue to monitor and progress as we see fit.   Pt. has now attended 22 sessions. He pushes himself when he is here to contiune to build his strength and stamina to what it was prior to his event. He is up to 3.1 MPH on the TM with a 0.5% incline.   Pt. has returned since being out due to COVID-19. He is tolerating the exercise well. He is currently awaiting a hip replacement so he is only using the  Arm Ergometer at this time.      Exercise Goals and Review:   Exercise Goals Re-Evaluation : Exercise Goals Re-Evaluation    Row Name 10/16/18 1446 11/13/18 1442 12/04/18 0814         Exercise Goal Re-Evaluation   Exercise Goals Review  Increase Physical Activity;Increase Strength and Stamina;Able to understand and use rate of perceived exertion (RPE) scale;Knowledge and understanding of Target Heart Rate Range (THRR);Able to check pulse independently;Understanding of Exercise Prescription  Increase Physical Activity;Increase Strength and Stamina;Able to understand and use rate of perceived exertion (RPE) scale;Knowledge and understanding of Target Heart Rate Range (THRR);Able to check pulse independently;Understanding of Exercise Prescription  Increase Physical Activity;Increase Strength and Stamina;Able to understand and use rate of perceived exertion (RPE) scale;Knowledge and understanding of Target Heart Rate Range (THRR);Able to check pulse independently;Understanding of Exercise Prescription     Comments  Pt. continues to progess well with his exercise. He is up to 2.8 MPH on the TM. He feels like he is getting stronger and is ready to be back to the workloads he was using prior to his heart event.   Pt. returned after 2 weeks off for a death in his family. He continues to do well in the program and is tolerating all the exercise with ease.   Pt. is still doing well in CR when he is consistantly coming. It will help him to better develop a routine and see improvements when he starts to show more regularly. He continues to tolerate the exercise well and is eager to get back to maintenance.      Expected Outcomes  to lose weight, his goal is 25 pounds   to lose weight, his goal is 25 pounds   lose weight, his goal  is 25 pounds          Discharge Exercise Prescription (Final Exercise Prescription Changes): Exercise Prescription Changes - 03/27/19 1000      Response to Exercise   Blood  Pressure (Admit)  122/62    Blood Pressure (Exercise)  130/70    Blood Pressure (Exit)  100/62    Heart Rate (Admit)  69 bpm    Heart Rate (Exercise)  92 bpm    Heart Rate (Exit)  77 bpm    Rating of Perceived Exertion (Exercise)  12    Comments  first 2 weeks back     Duration  Continue with 30 min of aerobic exercise without signs/symptoms of physical distress.    Intensity  THRR unchanged      Progression   Progression  Continue to progress workloads to maintain intensity without signs/symptoms of physical distress.    Average METs  3.2      Resistance Training   Training Prescription  Yes    Weight  4    Reps  10-15      Treadmill   MPH  --   needs hip replacement so switched to seated equipment      Arm Ergometer   Level  2.5    Watts  30    Minutes  17    METs  3.5      Arm/Foot Ergometer   Level  2.5    Watts  24    Minutes  22    METs  2.9      Home Exercise Plan   Plans to continue exercise at  Home (comment)    Frequency  Add 2 additional days to program exercise sessions.    Initial Home Exercises Provided  08/28/18       Nutrition:  Target Goals: Understanding of nutrition guidelines, daily intake of sodium <1560m, cholesterol <2042m calories 30% from fat and 7% or less from saturated fats, daily to have 5 or more servings of fruits and vegetables.  Biometrics:    Nutrition Therapy Plan and Nutrition Goals: Nutrition Therapy & Goals - 12/04/18 0854      Nutrition Therapy   RD appointment deferred  Yes      Personal Nutrition Goals   Comments  Patient has not meet with RD. This is his 2nd time in the program and he meet with him the 1st time. He says he has not changed his diet since starting the program. He follows a diabetic, low NA, low fat diet. Will continue to monitor.       Intervention Plan   Intervention  Nutrition handout(s) given to patient.       Nutrition Assessments:   Nutrition Goals Re-Evaluation:   Nutrition Goals  Discharge (Final Nutrition Goals Re-Evaluation):   Psychosocial: Target Goals: Acknowledge presence or absence of significant depression and/or stress, maximize coping skills, provide positive support system. Participant is able to verbalize types and ability to use techniques and skills needed for reducing stress and depression.  Initial Review & Psychosocial Screening:   Quality of Life Scores:  Scores of 19 and below usually indicate a poorer quality of life in these areas.  A difference of  2-3 points is a clinically meaningful difference.  A difference of 2-3 points in the total score of the Quality of Life Index has been associated with significant improvement in overall quality of life, self-image, physical symptoms, and general health in studies assessing change in quality of life.  PHQ-9:  Recent Review Flowsheet Data    Depression screen [facility] 2/9 08/28/2018 02/27/2018 10/25/2017 04/26/2017   Decreased Interest 0 0 0 0   Down, Depressed, Hopeless 0 0 0 0   PHQ - 2 Score 0 0 0 0   Altered sleeping '1 1 1 ' -   Tired, decreased energy '3 1 1 ' -   Change in appetite 1 1 0 -   Feeling bad or failure about yourself  0 0 0 -   Trouble concentrating 0 0 0 -   Moving slowly or fidgety/restless 0 0 0 -   Suicidal thoughts 0 0 0 -   PHQ-9 Score '5 3 2 ' -   Difficult doing work/chores Not difficult at all Not difficult at all Not difficult at all -     Interpretation of Total Score  Total Score Depression Severity:  1-4 = Minimal depression, 5-9 = Mild depression, 10-14 = Moderate depression, 15-19 = Moderately severe depression, 20-27 = Severe depression   Psychosocial Evaluation and Intervention:   Psychosocial Re-Evaluation: Psychosocial Re-Evaluation    Row Name 10/18/18 1315 11/15/18 1423 12/04/18 0858         Psychosocial Re-Evaluation   Current issues with  None Identified  None Identified  None Identified     Comments  Patient's initial QOL score was 22.81 and his PHQ-9 score  was 2 with no psychosocial issues identified.   Patient's initial QOL score was 22.81 and his PHQ-9 score was 2 with no psychosocial issues identified.   Patient's initial QOL score was 22.81 and his PHQ-9 score was 2 with no psychosocial issues identified.      Expected Outcomes  Patient will have no psychosocial issues identified at discharge.   Patient will have no psychosocial issues identified at discharge.   Patient will have no psychosocial issues identified at discharge.      Interventions  Stress management education;Encouraged to attend Cardiac Rehabilitation for the exercise;Relaxation education  Stress management education;Encouraged to attend Cardiac Rehabilitation for the exercise;Relaxation education  Stress management education;Encouraged to attend Cardiac Rehabilitation for the exercise;Relaxation education     Continue Psychosocial Services   No Follow up required  No Follow up required  No Follow up required        Psychosocial Discharge (Final Psychosocial Re-Evaluation): Psychosocial Re-Evaluation - 12/04/18 2992      Psychosocial Re-Evaluation   Current issues with  None Identified    Comments  Patient's initial QOL score was 22.81 and his PHQ-9 score was 2 with no psychosocial issues identified.     Expected Outcomes  Patient will have no psychosocial issues identified at discharge.     Interventions  Stress management education;Encouraged to attend Cardiac Rehabilitation for the exercise;Relaxation education    Continue Psychosocial Services   No Follow up required       Vocational Rehabilitation: Provide vocational rehab assistance to qualifying candidates.   Vocational Rehab Evaluation & Intervention:   Education: Education Goals: Education classes will be provided on a weekly basis, covering required topics. Participant will state understanding/return demonstration of topics presented.  Learning Barriers/Preferences:   Education Topics: Hypertension,  Hypertension Reduction -Define heart disease and high blood pressure. Discus how high blood pressure affects the body and ways to reduce high blood pressure.   CARDIAC REHAB PHASE II EXERCISE from 03/21/2019 in [place]  Date  03/14/19  Educator  D. Coad  Instruction Review Code  2- Demonstrated Understanding      Exercise and Your Heart -  Discuss why it is important to exercise, the FITT principles of exercise, normal and abnormal responses to exercise, and how to exercise safely.   CARDIAC REHAB PHASE II EXERCISE from 03/21/2019 in [place]  Date  03/21/19  Educator  DC  Instruction Review Code  2- Demonstrated Understanding      Angina -Discuss definition of angina, causes of angina, treatment of angina, and how to decrease risk of having angina.   CARDIAC REHAB PHASE II EXERCISE from 02/15/2018 in [place]  Date  11/02/17  Educator  DJ  Instruction Review Code  2- Demonstrated Understanding      Cardiac Medications -Review what the following cardiac medications are used for, how they affect the body, and side effects that may occur when taking the medications.  Medications include Aspirin, Beta blockers, calcium channel blockers, ACE Inhibitors, angiotensin receptor blockers, diuretics, digoxin, and antihyperlipidemics.   CARDIAC REHAB PHASE II EXERCISE from 03/21/2019 in [place]  Date  11/08/18  Educator  Wynetta Emery  Instruction Review Code  2- Demonstrated Understanding      Congestive Heart Failure -Discuss the definition of CHF, how to live with CHF, the signs and symptoms of CHF, and how keep track of weight and sodium intake.   CARDIAC REHAB PHASE II EXERCISE from 03/21/2019 in [place]  Date  11/15/18  Educator  Wynetta Emery  Instruction Review Code  2- Demonstrated Understanding      Heart Disease and Intimacy -Discus the effect sexual activity has on  the heart, how changes occur during intimacy as we age, and safety during sexual activity.   Smoking Cessation / COPD -Discuss different methods to quit smoking, the health benefits of quitting smoking, and the definition of COPD.   CARDIAC REHAB PHASE II EXERCISE from 02/15/2018 in [place]  Date  12/01/17  Educator  DC  Instruction Review Code  2- Demonstrated Understanding      Nutrition I: Fats -Discuss the types of cholesterol, what cholesterol does to the heart, and how cholesterol levels can be controlled.   CARDIAC REHAB PHASE II EXERCISE from 02/15/2018 in [place]  Date  12/07/17  Educator  DC  Instruction Review Code  2- Demonstrated Understanding      Nutrition II: Labels -Discuss the different components of food labels and how to read food label   CARDIAC REHAB PHASE II EXERCISE from 03/21/2019 in [place]  Date  09/15/18  Educator  Coad  Instruction Review Code  2- Demonstrated Understanding      Heart Parts/Heart Disease and PAD -Discuss the anatomy of the heart, the pathway of blood circulation through the heart, and these are affected by heart disease.   CARDIAC REHAB PHASE II EXERCISE from 03/21/2019 in [place]  Date  09/20/18  Educator  Wynetta Emery  Instruction Review Code  2- Demonstrated Understanding      Stress I: Signs and Symptoms -Discuss the causes of stress, how stress may lead to anxiety and depression, and ways to limit stress.   CARDIAC REHAB PHASE II EXERCISE from 03/21/2019 in [place]  Date  09/27/18  Educator  Wynetta Emery  Instruction Review Code  2- Demonstrated Understanding      Stress II: Relaxation -Discuss different types of relaxation techniques to limit stress.   CARDIAC REHAB PHASE II EXERCISE from 03/21/2019 in [place]  Date  10/04/18  Educator  Angelina Pih  Instruction Review Code  2-  Demonstrated Understanding      Warning Signs of Stroke / TIA -Discuss definition of a stroke, what the signs and symptoms are of a stroke, and how to identify when someone is having stroke.   CARDIAC REHAB PHASE II EXERCISE from 03/21/2019 in [place]  Date  10/11/18  Educator  Coad  Instruction Review Code  2- Demonstrated Understanding      Knowledge Questionnaire Score:   Core Components/Risk Factors/Patient Goals at Admission:   Core Components/Risk Factors/Patient Goals Review:  Goals and Risk Factor Review    Row Name 10/18/18 1312 11/15/18 1420 12/04/18 0855         Core Components/Risk Factors/Patient Goals Review   Personal Goals Review  Weight Management/Obesity;Diabetes Regain health; lose 25 lbs.   Weight Management/Obesity;Diabetes Regain health; lose 25 lbs.   Weight Management/Obesity;Diabetes Regain health; lose 25 lbs.      Review  Patient has completed 14 sessions maintaining his weight since last 30 day review. He continues to do well in the program with progression. He says he feels like he is getting his heart back in heart healthy shape and feels his stamina is improving. His last A1C was 6 months ago at 6.2. His blood pressue is controlled. Will continue to monitor for progress.   Patient has completed 19 sessions losing 2 lbs since last 30 day review. He continues to do well in the program with progression. He states the program has helped him get back into a healthy exercise habit and he has learned a lot from the education classes. He has not had an A1C recently. His reported fasting glucose readings are averaging 130 mg/dl. Will continue to monitor for progress.   Patient has completed 22 sessions gaing 5 lbs since last 30 day review. He continues to do well in the program with progression. He says he has a better mental outlook now that he is back exercising and feels stronger. His last A1C was one month ago at 6.2 mg/dl. His fasting  glucose readings are averaging 120 mg/dl. Outpatient cardiac rehab servises has been suspended due to the COVID-19 restrictions. Will continue to montior for progress.      Expected Outcomes  Patient will continue to attend sessions and complete the program meeting his personal goals.   Patient will continue to attend sessions and complete the program meeting his personal goals.   Patient will continue to attend sessions and complete the program meeting his personal goals.         Core Components/Risk Factors/Patient Goals at Discharge (Final Review):  Goals and Risk Factor Review - 12/04/18 0855      Core Components/Risk Factors/Patient Goals Review   Personal Goals Review  Weight Management/Obesity;Diabetes   Regain health; lose 25 lbs.    Review  Patient has completed 22 sessions gaing 5 lbs since last 30 day review. He continues to do well in the program with progression. He says he has a better mental outlook now that he is back exercising and feels stronger. His last A1C was one month ago at 6.2 mg/dl. His fasting glucose readings are averaging 120 mg/dl. Outpatient cardiac rehab servises has been suspended due to the COVID-19 restrictions. Will continue to montior for progress.     Expected Outcomes  Patient will continue to attend sessions and complete the program meeting his personal goals.        ITP Comments: ITP Comments    Row Name 12/01/18  1302 04/04/19 1506         ITP Comments  Cardiac Rehab services have been suspended due to the COVID-19 restrictions starting 11/27/2018. Patient will restart the progam when restrictions have been lifted.  Patient stopped attending 03/26/19 after completing 29 sessions due to scheduled hip replacement surgery. MD will be notified.         Comments: Patient stopped coming to Cardiac Rehab on 03/26/19 after completing 29 sessions due to scheduled hip replacement surgery.  Doctor will be informed.

## 2019-04-06 ENCOUNTER — Encounter (HOSPITAL_COMMUNITY): Payer: 59

## 2019-04-09 ENCOUNTER — Encounter (HOSPITAL_COMMUNITY): Payer: 59

## 2019-04-09 ENCOUNTER — Encounter: Payer: Self-pay | Admitting: *Deleted

## 2019-04-09 DIAGNOSIS — Z006 Encounter for examination for normal comparison and control in clinical research program: Secondary | ICD-10-CM

## 2019-04-10 ENCOUNTER — Other Ambulatory Visit: Payer: Self-pay

## 2019-04-10 NOTE — Progress Notes (Signed)
New Patient Office Visit  Assessment & Plan:  1. Type 2 diabetes mellitus with complication (HCC) Lab Results  Component Value Date   HGBA1C 7.0 (H) 04/11/2019   HGBA1C 9.4 (H) 03/25/2017   - Diabetes is at goal of A1c 7. - Medications: continue current medications - Home glucose monitoring: continue random glucose checks as needed - Patient is currently taking a statin. Patient is taking an ACE-inhibitor/ARB.  - Last foot exam: 04/11/2019 - Last diabetic eye exam: UTD per patient - requested from [place] - Urine Microalbumin/Creat Ratio: unknown - Instruction/counseling given: discussed foot care - Bayer DCA Hb A1c Waived - CMP14+EGFR - metFORMIN (GLUCOPHAGE-XR) 500 MG 24 hr tablet; Take 2 tablets (1,000 mg total) by mouth every evening.  Dispense: 180 tablet; Refill: 1  2. Shortness of breath - albuterol (VENTOLIN HFA) 108 (90 Base) MCG/ACT inhaler; Inhale 2 puffs into the lungs every 6 (six) hours as needed for wheezing or shortness of breath.  Dispense: 18 g; Refill: 2  3. Chronic pain syndrome - Patient is agreeable to having x-rays of his back completed and decreasing Norco back down after his hip surgery. Will complete controlled substance agreement next week.  - HYDROcodone-acetaminophen (NORCO) 10-325 MG tablet; Take 1 tablet by mouth every 6 (six) hours as needed for moderate pain or severe pain.  Dispense: 120 tablet; Refill: 0  4. Primary osteoarthritis of right hip - Scheduled for hip replacement next month.  - HYDROcodone-acetaminophen (NORCO) 10-325 MG tablet; Take 1 tablet by mouth every 6 (six) hours as needed for moderate pain or severe pain.  Dispense: 120 tablet; Refill: 0  5. Difficulty sleeping - Discussed weaning off and trying something else; he is agreeable. We will discuss further after hip surgery.  - zolpidem (AMBIEN) 10 MG tablet; Take 1 tablet (10 mg total) by mouth at bedtime as needed for sleep.  Dispense: 30 tablet; Refill: 1  6.  Obstructive sleep apnea syndrome - Not able to tolerate the CPAP.   7. Essential hypertension - Well controlled on current regimen. Patient does have a cardiologist.  - amLODipine (NORVASC) 5 MG tablet; Take 1 tablet (5 mg total) by mouth daily.  Dispense: 90 tablet; Refill: 1 - furosemide (LASIX) 20 MG tablet; Take 1 tablet (20 mg total) by mouth daily as needed for edema.  Dispense: 90 tablet; Refill: 1 - hydrALAZINE (APRESOLINE) 50 MG tablet; Take 1 tablet (50 mg total) by mouth every 8 (eight) hours.  Dispense: 270 tablet; Refill: 1 - isosorbide mononitrate (IMDUR) 30 MG 24 hr tablet; Take 0.5 tablets (15 mg total) by mouth daily.  Dispense: 45 tablet; Refill: 1 - losartan (COZAAR) 100 MG tablet; Take 1 tablet (100 mg total) by mouth daily.  Dispense: 90 tablet; Refill: 1 - metoprolol tartrate (LOPRESSOR) 50 MG tablet; Take 1 tablet (50 mg total) by mouth 2 (two) times daily.  Dispense: 180 tablet; Refill: 1  8. Coronary artery disease involving native coronary artery of native heart without angina pectoris - Managed by cardiologist.  - clopidogrel (PLAVIX) 75 MG tablet; Take 1 tablet (75 mg total) by mouth daily.  Dispense: 90 tablet; Refill: 1 - furosemide (LASIX) 20 MG tablet; Take 1 tablet (20 mg total) by mouth daily as needed for edema.  Dispense: 90 tablet; Refill: 1 - isosorbide mononitrate (IMDUR) 30 MG 24 hr tablet; Take 0.5 tablets (15 mg total) by mouth daily.  Dispense: 45 tablet; Refill: 1 - losartan (COZAAR) 100 MG tablet; Take 1 tablet (  100 mg total) by mouth daily.  Dispense: 90 tablet; Refill: 1 - metoprolol tartrate (LOPRESSOR) 50 MG tablet; Take 1 tablet (50 mg total) by mouth 2 (two) times daily.  Dispense: 180 tablet; Refill: 1  9. Dyslipidemia, goal LDL below 70 - Well controlled on current regimen.  - atorvastatin (LIPITOR) 40 MG tablet; Take 1 tablet (40 mg total) by mouth daily at 6 PM.  Dispense: 90 tablet; Refill: 1  10. Immunization due - SHINGRIX injection;  Inject 0.5 mLs into the muscle once for 1 dose.  Dispense: 0.5 mL; Refill: 0  11. Encounter for hepatitis C screening test for low risk patient - Hepatitis C antibody  12. Encounter to establish care   Follow-up: Return in about 1 week (around 04/18/2019) for CSA.   [name], MSN, APRN, FNP-C [facility]  Subjective:  Patient ID: [name] Foster, male    DOB: 1956-08-15  Age: 63 y.o. MRN: [id number]  Patient Care Team: [name], FNP as PCP - General (Family Medicine) Sherren Mocha, MD as Consulting Physician (Cardiology) [facility] as Consulting Physician (Orthopedic Surgery)  CC:  Chief Complaint  Patient presents with  . New Patient (Initial Visit)    HPI [name] Foster presents to establish care. He is a previous patient of Dr. Melina Copa who has retired.   Sleep Apnea: patient unable to tolerate CPAP.   Patient reports he is scheduled for a right hip replacement on August 12th. He was previously prescribed Norco 5/325 due to chronic back pain and Dr. Melina Copa had to increase it recently to the 10/325 tablets due to increasing right hip pain. Patient reports he had x-rays of his back completed but that was probably 20+ years ago.   He states he takes Ambien for sleep. He does recall previously taking "something that started with H" but states that he quit taking it because it was making him do strange things at night that he wouldn't remember the next day.    Review of Systems  Constitutional: Negative for chills, fever, malaise/fatigue and weight loss.  HENT: Negative for congestion, ear discharge, ear pain, nosebleeds, sinus pain, sore throat and tinnitus.   Eyes: Negative for blurred vision, double vision, pain, discharge and redness.  Respiratory: Positive for shortness of breath. Negative for cough and wheezing.   Cardiovascular: Negative for chest pain, palpitations and leg swelling.  Gastrointestinal: Negative for abdominal pain,  constipation, diarrhea, heartburn, nausea and vomiting.  Genitourinary: Negative for dysuria, frequency and urgency.       Denies trouble initiating a urine stream, weak stream, split stream, and dribbling.   Musculoskeletal: Positive for back pain and joint pain. Negative for myalgias.  Skin: Negative for rash.  Neurological: Negative for dizziness, seizures, weakness and headaches.  Psychiatric/Behavioral: Negative for depression, substance abuse and suicidal ideas. The patient is not nervous/anxious.     Current Outpatient Medications:  .  amLODipine (NORVASC) 5 MG tablet, TAKE 1 TABLET BY MOUTH EVERY DAY, Disp: 30 tablet, Rfl: 10 .  atorvastatin (LIPITOR) 40 MG tablet, Take 1 tablet (40 mg total) by mouth daily., Disp: 90 tablet, Rfl: 3 .  clopidogrel (PLAVIX) 75 MG tablet, TAKE 1 TABLET BY MOUTH EVERY DAY, Disp: 90 tablet, Rfl: 3 .  [employer] ASPIRIN LOW STRENGTH 81 MG EC tablet, TAKE 1 TABLET BY MOUTH EVERY DAY, Disp: 90 tablet, Rfl: 3 .  furosemide (LASIX) 20 MG tablet, TAKE 1 TABLET BY MOUTH ONCE A DAY AS NEEDED FOR EDEMA 90 DAYS, Disp: ,  Rfl:  .  hydrALAZINE (APRESOLINE) 50 MG tablet, TAKE 1 TABLET (50 MG TOTAL) BY MOUTH EVERY 8 (EIGHT) HOURS., Disp: 270 tablet, Rfl: 3 .  HYDROcodone-acetaminophen (NORCO) 10-325 MG tablet, Take 1 tablet by mouth every 6 (six) hours as needed for moderate pain. , Disp: , Rfl:  .  isosorbide mononitrate (IMDUR) 30 MG 24 hr tablet, TAKE 0.5 TABLETS (15 MG TOTAL) BY MOUTH DAILY., Disp: 45 tablet, Rfl: 2 .  losartan (COZAAR) 50 MG tablet, TAKE 2 TABLETS (100MG TOTAL DOSE) BY MOUTH EVERY DAY, Disp: 180 tablet, Rfl: 1 .  metFORMIN (GLUCOPHAGE-XR) 500 MG 24 hr tablet, Take 1,000 mg by mouth every evening. , Disp: , Rfl:  .  metoprolol tartrate (LOPRESSOR) 50 MG tablet, Take 1 tablet (50 mg total) by mouth 2 (two) times daily., Disp: 180 tablet, Rfl: 3 .  nitroGLYCERIN (NITROSTAT) 0.4 MG SL tablet, Place 1 tablet (0.4 mg total) under the tongue every 5 (five) minutes  x 3 doses as needed for chest pain., Disp: 25 tablet, Rfl: 11 .  zolpidem (AMBIEN) 10 MG tablet, Take 10 mg by mouth at bedtime as needed for sleep. , Disp: , Rfl:  .  albuterol (VENTOLIN HFA) 108 (90 Base) MCG/ACT inhaler, Inhale 2 puffs into the lungs every 6 (six) hours as needed for wheezing or shortness of breath., Disp: 18 g, Rfl: 2  No Known Allergies  Past Medical History:  Diagnosis Date  . CAD (coronary artery disease)    a. 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 DES to left PDA 09/20/17 // c. 10/19: LCx and LPDA stents ok; s/p DES to pLAD and POBA to D1  . Diabetes mellitus without complication ([place])   . Heart attack ([place])   . Hyperlipidemia   . Hypertension   . Insomnia   . Pulmonary hypertension ([place])    a. TTE 03/25/17; EF 60-65%, nl WM, LV diastolic fxn nl, trivial AI, RV cavity size, wall thickness, and sys fxn nl, trivial TR, PASP 39 mmHg, trivial pericardial effusion  . Sleep apnea    could not use a cpap  . Wears dentures    full top-partial bottom  . Wears glasses     Past Surgical History:  Procedure Laterality Date  . COLONOSCOPY    . CORONARY ATHERECTOMY N/A 07/03/2018   Procedure: CORONARY ATHERECTOMY;  Surgeon: Burnell Blanks, MD;  Location: [place] [facility];  Service: Cardiovascular;  Laterality: N/A;  . CORONARY STENT INTERVENTION N/A 09/20/2017   Procedure: CORONARY STENT INTERVENTION;  Surgeon: Leonie Man, MD;  Location: [place] [facility];  Service: Cardiovascular;  Laterality: N/A;  . CORONARY STENT INTERVENTION N/A 07/03/2018   Procedure: CORONARY STENT INTERVENTION;  Surgeon: Burnell Blanks, MD;  Location: [place] [facility];  Service: Cardiovascular;  Laterality: N/A;  . CORONARY/GRAFT ACUTE MI REVASCULARIZATION N/A 03/25/2017   Procedure: Coronary/Graft Acute MI Revascularization;  Surgeon: Sherren Mocha, MD;  Location: [place] [facility];  Service: Cardiovascular;  Laterality: N/A;  . GANGLION CYST EXCISION Left 07/25/2014   Procedure: EXCISION VOLAR AND DORSAL GANGLION LEFT WRIST;  Surgeon: Leanora Cover, MD;  Location: [place];  Service: Orthopedics;  Laterality: Left;  . INTRAVASCULAR PRESSURE WIRE/FFR STUDY N/A 09/20/2017   Procedure: INTRAVASCULAR PRESSURE WIRE/FFR STUDY;  Surgeon: Leonie Man, MD;  Location: [place] [facility];  Service:  Cardiovascular;  Laterality: N/A;  . INTRAVASCULAR PRESSURE WIRE/FFR STUDY N/A 06/30/2018   Procedure: INTRAVASCULAR PRESSURE WIRE/FFR STUDY;  Surgeon: Nelva Bush, MD;  Location: [place] [facility];  Service: Cardiovascular;  Laterality: N/A;  . KNEE ARTHROSCOPY     rifgr and left  . LEFT HEART CATH AND CORONARY ANGIOGRAPHY N/A 03/25/2017   Procedure: Left Heart Cath and Coronary Angiography;  Surgeon: Sherren Mocha, MD;  Location: [place] [facility];  Service: Cardiovascular;  Laterality: N/A;  . LEFT HEART CATH AND CORONARY ANGIOGRAPHY N/A 09/20/2017   Procedure: LEFT HEART CATH AND CORONARY ANGIOGRAPHY;  Surgeon: Leonie Man, MD;  Location: [place] [facility];  Service: Cardiovascular;  Laterality: N/A;  . LEFT HEART CATH AND CORONARY ANGIOGRAPHY N/A 06/30/2018   Procedure: LEFT HEART CATH AND CORONARY ANGIOGRAPHY;  Surgeon: Nelva Bush, MD;  Location: [place] [facility];  Service: Cardiovascular;  Laterality: N/A;  . SHOULDER ARTHROSCOPY  2012   left  . SHOULDER ARTHROSCOPY     right  . TEMPORARY PACEMAKER N/A 03/25/2017   Procedure: Temporary Pacemaker;  Surgeon: Sherren Mocha, MD;  Location: [place] [facility];  Service: Cardiovascular;  Laterality: N/A;  . TONSILLECTOMY    . TOTAL KNEE ARTHROPLASTY  2002   left    Family History  Problem Relation Age of Onset  . Hypertension Mother   . Hypertension Father   . Hypertension Brother   . Heart disease Maternal Grandfather     Social History    Socioeconomic History  . Marital status: Married    Spouse name: Not on file  . Number of children: Not on file  . Years of education: Not on file  . Highest education level: Not on file  Occupational History  . Occupation: Retired  Scientific laboratory technician  . Financial resource strain: Not on file  . Food insecurity    Worry: Not on file    Inability: Not on file  . Transportation needs    Medical: Not on file    Non-medical: Not on file  Tobacco Use  . Smoking status: Former Smoker    Packs/day: 2.00    Years: 30.00    Pack years: 60.00    Quit date: 03/25/2017    Years since quitting: 2.0  . Smokeless tobacco: Never Used  Substance and Sexual Activity  . Alcohol use: Yes    Alcohol/week: 3.0 standard drinks    Types: 1 Glasses of wine, 1 Cans of beer, 1 Shots of liquor per week    Comment: drinks daily  beer and whiskey  . Drug use: No  . Sexual activity: Not Currently  Lifestyle  . Physical activity    Days per week: Not on file    Minutes per session: Not on file  . Stress: Not on file  Relationships  . Social Herbalist on phone: Not on file    Gets together: Not on file    Attends religious service: Not on file    Active member of club or organization: Not on file    Attends meetings of clubs or organizations: Not on file    Relationship status: Not on file  . Intimate partner violence    Fear of current or ex partner: Not on file    Emotionally abused: Not on file    Physically abused: Not on file    Forced sexual activity: Not on file  Other Topics Concern  . Not on file  Social History Narrative  Per wife, pt was preparing to retire soon from his job at [employer].    Objective:   Today's Vitals: BP 124/69   Pulse (!) 101   Temp 98.9 F (37.2 C) (Temporal)   Ht '5\' 8"'  (1.727 m)   Wt 201 lb (91.2 kg)   BMI 30.56 kg/m   Physical Exam Vitals signs reviewed.  Constitutional:      General: He is not in acute distress.    Appearance: Normal appearance.  He is obese. He is not ill-appearing, toxic-appearing or diaphoretic.  HENT:     Head: Normocephalic and atraumatic.     Right Ear: Tympanic membrane, ear canal and external ear normal. There is no impacted cerumen.     Left Ear: Tympanic membrane, ear canal and external ear normal. There is no impacted cerumen.     Nose: Nose normal. No congestion or rhinorrhea.     Mouth/Throat:     Mouth: Mucous membranes are moist.     Pharynx: Oropharynx is clear. No oropharyngeal exudate or posterior oropharyngeal erythema.  Eyes:     General: No scleral icterus.       Right eye: No discharge.        Left eye: No discharge.     Conjunctiva/sclera: Conjunctivae normal.     Pupils: Pupils are equal, round, and reactive to light.  Neck:     Musculoskeletal: Normal range of motion and neck supple. No neck rigidity or muscular tenderness.  Cardiovascular:     Rate and Rhythm: Normal rate and regular rhythm.     Heart sounds: Normal heart sounds. No murmur. No friction rub. No gallop.   Pulmonary:     Effort: Pulmonary effort is normal. No respiratory distress.     Breath sounds: Normal breath sounds. No stridor. No wheezing, rhonchi or rales.  Abdominal:     General: Abdomen is flat. Bowel sounds are normal. There is no distension.     Palpations: Abdomen is soft. There is no mass.     Tenderness: There is no abdominal tenderness. There is no guarding or rebound.     Hernia: No hernia is present.  Musculoskeletal: Normal range of motion.     Right lower leg: No edema.     Left lower leg: No edema.  Lymphadenopathy:     Cervical: No cervical adenopathy.  Skin:    General: Skin is warm and dry.     Capillary Refill: Capillary refill takes less than 2 seconds.  Neurological:     General: No focal deficit present.     Mental Status: He is alert and oriented to person, place, and time. Mental status is at baseline.  Psychiatric:        Mood and Affect: Mood normal.        Behavior: Behavior  normal.        Thought Content: Thought content normal.        Judgment: Judgment normal.     Diabetic Foot Exam - Simple   Simple Foot Form Diabetic Foot exam was performed with the following findings: Yes 04/11/2019  1:45 PM  Visual Inspection No deformities, no ulcerations, no other skin breakdown bilaterally: Yes Sensation Testing Intact to touch and monofilament testing bilaterally: Yes Pulse Check Posterior Tibialis and Dorsalis pulse intact bilaterally: Yes Comments

## 2019-04-11 ENCOUNTER — Encounter (HOSPITAL_COMMUNITY): Payer: 59

## 2019-04-11 ENCOUNTER — Encounter: Payer: Self-pay | Admitting: Family Medicine

## 2019-04-11 ENCOUNTER — Ambulatory Visit: Payer: 59 | Admitting: Family Medicine

## 2019-04-11 VITALS — BP 124/69 | HR 101 | Temp 98.9°F | Ht 68.0 in | Wt 201.0 lb

## 2019-04-11 DIAGNOSIS — Z23 Encounter for immunization: Secondary | ICD-10-CM | POA: Diagnosis not present

## 2019-04-11 DIAGNOSIS — M1611 Unilateral primary osteoarthritis, right hip: Secondary | ICD-10-CM

## 2019-04-11 DIAGNOSIS — I10 Essential (primary) hypertension: Secondary | ICD-10-CM

## 2019-04-11 DIAGNOSIS — G4733 Obstructive sleep apnea (adult) (pediatric): Secondary | ICD-10-CM

## 2019-04-11 DIAGNOSIS — G894 Chronic pain syndrome: Secondary | ICD-10-CM

## 2019-04-11 DIAGNOSIS — Z7689 Persons encountering health services in other specified circumstances: Secondary | ICD-10-CM

## 2019-04-11 DIAGNOSIS — Z1159 Encounter for screening for other viral diseases: Secondary | ICD-10-CM

## 2019-04-11 DIAGNOSIS — R0602 Shortness of breath: Secondary | ICD-10-CM | POA: Diagnosis not present

## 2019-04-11 DIAGNOSIS — E118 Type 2 diabetes mellitus with unspecified complications: Secondary | ICD-10-CM

## 2019-04-11 DIAGNOSIS — I2510 Atherosclerotic heart disease of native coronary artery without angina pectoris: Secondary | ICD-10-CM

## 2019-04-11 DIAGNOSIS — G479 Sleep disorder, unspecified: Secondary | ICD-10-CM

## 2019-04-11 DIAGNOSIS — E785 Hyperlipidemia, unspecified: Secondary | ICD-10-CM

## 2019-04-11 LAB — BAYER DCA HB A1C WAIVED: HB A1C (BAYER DCA - WAIVED): 7 % — ABNORMAL HIGH (ref <7.0)

## 2019-04-11 MED ORDER — ALBUTEROL SULFATE HFA 108 (90 BASE) MCG/ACT IN AERS: 2.0000 | INHALATION_SPRAY | Freq: Four times a day (QID) | RESPIRATORY_TRACT | 2 refills | Status: DC | PRN

## 2019-04-11 MED ORDER — SHINGRIX 50 MCG/0.5ML IM SUSR: 0.5000 mL | Freq: Once | INTRAMUSCULAR | 0 refills | Status: AC

## 2019-04-11 NOTE — Patient Instructions (Signed)

## 2019-04-12 LAB — CMP14+EGFR
ALT: 59 IU/L — ABNORMAL HIGH (ref 0–44)
AST: 56 IU/L — ABNORMAL HIGH (ref 0–40)
Albumin/Globulin Ratio: 2 (ref 1.2–2.2)
Albumin: 4.6 g/dL (ref 3.8–4.8)
Alkaline Phosphatase: 63 IU/L (ref 39–117)
BUN/Creatinine Ratio: 23 (ref 10–24)
BUN: 25 mg/dL (ref 8–27)
Bilirubin Total: 0.4 mg/dL (ref 0.0–1.2)
CO2: 22 mmol/L (ref 20–29)
Calcium: 9.8 mg/dL (ref 8.6–10.2)
Chloride: 95 mmol/L — ABNORMAL LOW (ref 96–106)
Creatinine, Ser: 1.09 mg/dL (ref 0.76–1.27)
GFR calc Af Amer: 82 mL/min/{1.73_m2} (ref >59)
GFR calc non Af Amer: 71 mL/min/{1.73_m2} (ref >59)
Globulin, Total: 2.3 g/dL (ref 1.5–4.5)
Glucose: 183 mg/dL — ABNORMAL HIGH (ref 65–99)
Potassium: 4.1 mmol/L (ref 3.5–5.2)
Sodium: 139 mmol/L (ref 134–144)
Total Protein: 6.9 g/dL (ref 6.0–8.5)

## 2019-04-12 LAB — HEPATITIS C ANTIBODY: Hep C Virus Ab: <0.1 s/co ratio (ref 0.0–0.9)

## 2019-04-12 MED ORDER — FUROSEMIDE 20 MG PO TABS: 20.0000 mg | ORAL_TABLET | Freq: Every day | ORAL | 1 refills | Status: DC | PRN

## 2019-04-12 MED ORDER — ISOSORBIDE MONONITRATE ER 30 MG PO TB24: 15.0000 mg | ORAL_TABLET | Freq: Every day | ORAL | 1 refills | Status: DC

## 2019-04-12 MED ORDER — ATORVASTATIN CALCIUM 40 MG PO TABS: 40.0000 mg | ORAL_TABLET | Freq: Every day | ORAL | 1 refills | Status: DC

## 2019-04-12 MED ORDER — CLOPIDOGREL BISULFATE 75 MG PO TABS: 75.0000 mg | ORAL_TABLET | Freq: Every day | ORAL | 1 refills | Status: DC

## 2019-04-12 MED ORDER — METFORMIN HCL ER 500 MG PO TB24: 1000.0000 mg | ORAL_TABLET | Freq: Every evening | ORAL | 1 refills | Status: DC

## 2019-04-12 MED ORDER — METOPROLOL TARTRATE 50 MG PO TABS: 50.0000 mg | ORAL_TABLET | Freq: Two times a day (BID) | ORAL | 1 refills | Status: DC

## 2019-04-12 MED ORDER — AMLODIPINE BESYLATE 5 MG PO TABS: 5.0000 mg | ORAL_TABLET | Freq: Every day | ORAL | 1 refills | Status: DC

## 2019-04-12 MED ORDER — HYDROCODONE-ACETAMINOPHEN 10-325 MG PO TABS: 1.0000 | ORAL_TABLET | Freq: Four times a day (QID) | ORAL | 0 refills | Status: DC | PRN

## 2019-04-12 MED ORDER — HYDRALAZINE HCL 50 MG PO TABS: 50.0000 mg | ORAL_TABLET | Freq: Three times a day (TID) | ORAL | 1 refills | Status: DC

## 2019-04-12 MED ORDER — LOSARTAN POTASSIUM 100 MG PO TABS: 100.0000 mg | ORAL_TABLET | Freq: Every day | ORAL | 1 refills | Status: DC

## 2019-04-12 MED ORDER — ZOLPIDEM TARTRATE 10 MG PO TABS: 10.0000 mg | ORAL_TABLET | Freq: Every evening | ORAL | 1 refills | Status: DC | PRN

## 2019-04-13 ENCOUNTER — Encounter (HOSPITAL_COMMUNITY): Payer: 59

## 2019-04-16 ENCOUNTER — Ambulatory Visit: Payer: Self-pay | Admitting: Orthopedic Surgery

## 2019-04-16 ENCOUNTER — Encounter (HOSPITAL_COMMUNITY): Payer: 59

## 2019-04-16 NOTE — H&P (View-Only) (Signed)
TOTAL HIP ADMISSION H&P  Patient is admitted for right total hip arthroplasty.  Subjective:  Chief Complaint: right hip pain  HPI: [name] Foster, 63 y.o. male, has a history of pain and functional disability in the right hip(s) due to arthritis and patient has failed non-surgical conservative treatments for greater than 12 weeks to include NSAID's and/or analgesics, flexibility and strengthening excercises, use of assistive devices, weight reduction as appropriate and activity modification.  Onset of symptoms was gradual starting 4 years ago with rapidlly worsening course since that time.The patient noted no past surgery on the right hip(s).  Patient currently rates pain in the right hip at 10 out of 10 with activity. Patient has night pain, worsening of pain with activity and weight bearing, trendelenberg gait, pain that interfers with activities of daily living and pain with passive range of motion. Patient has evidence of subchondral cysts, subchondral sclerosis, periarticular osteophytes and joint space narrowing by imaging studies. This condition presents safety issues increasing the risk of falls.  There is no current active infection.  Patient Active Problem List   Diagnosis Date Noted  . Osteoarthritis of right hip 11/21/2018  . Type 2 diabetes mellitus with complication ([place]) 25/85/2778  . History of ST elevation myocardial infarction (STEMI) 03/25/2017  . CAD (coronary artery disease) 03/25/2017  . Presence of drug-eluting stent in left circumflex coronary artery 03/25/2017  . Essential hypertension 12/01/2010  . Sleep apnea 12/01/2010  . Dyslipidemia, goal LDL below 70 12/01/2010   Past Medical History:  Diagnosis Date  . CAD (coronary artery disease)    a. 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 DES to left PDA 09/20/17 // c. 10/19: LCx and LPDA stents ok;  s/p DES to pLAD and POBA to D1  . Diabetes mellitus without complication ([place])   . Heart attack ([place])   . Hyperlipidemia   . Hypertension   . Insomnia   . Pulmonary hypertension ([place])    a. TTE 03/25/17; EF 60-65%, nl WM, LV diastolic fxn nl, trivial AI, RV cavity size, wall thickness, and sys fxn nl, trivial TR, PASP 39 mmHg, trivial pericardial effusion  . Sleep apnea    could not use a cpap  . Wears dentures    full top-partial bottom  . Wears glasses     Past Surgical History:  Procedure Laterality Date  . COLONOSCOPY    . CORONARY ATHERECTOMY N/A 07/03/2018   Procedure: CORONARY ATHERECTOMY;  Surgeon: Burnell Blanks, MD;  Location: [place] [facility];  Service: Cardiovascular;  Laterality: N/A;  . CORONARY STENT INTERVENTION N/A 09/20/2017   Procedure: CORONARY STENT INTERVENTION;  Surgeon: Leonie Man, MD;  Location: [place] [facility];  Service: Cardiovascular;  Laterality: N/A;  . CORONARY STENT INTERVENTION N/A 07/03/2018   Procedure: CORONARY STENT INTERVENTION;  Surgeon: Burnell Blanks, MD;  Location: [place] [facility];  Service: Cardiovascular;  Laterality: N/A;  . CORONARY/GRAFT ACUTE MI REVASCULARIZATION N/A 03/25/2017   Procedure: Coronary/Graft Acute MI Revascularization;  Surgeon: Sherren Mocha, MD;  Location: [place] [facility];  Service: Cardiovascular;  Laterality: N/A;  . GANGLION CYST EXCISION Left 07/25/2014   Procedure: EXCISION VOLAR AND DORSAL GANGLION LEFT WRIST;  Surgeon: Leanora Cover, MD;  Location: [place];  Service: Orthopedics;  Laterality: Left;  . INTRAVASCULAR PRESSURE WIRE/FFR STUDY N/A 09/20/2017   Procedure: INTRAVASCULAR  PRESSURE WIRE/FFR STUDY;  Surgeon: Leonie Man, MD;  Location: [place] [facility];  Service: Cardiovascular;  Laterality: N/A;  . INTRAVASCULAR PRESSURE WIRE/FFR STUDY N/A 06/30/2018   Procedure: INTRAVASCULAR PRESSURE WIRE/FFR STUDY;  Surgeon: Nelva Bush, MD;  Location: [place]  [facility];  Service: Cardiovascular;  Laterality: N/A;  . KNEE ARTHROSCOPY     rifgr and left  . LEFT HEART CATH AND CORONARY ANGIOGRAPHY N/A 03/25/2017   Procedure: Left Heart Cath and Coronary Angiography;  Surgeon: Sherren Mocha, MD;  Location: [place] [facility];  Service: Cardiovascular;  Laterality: N/A;  . LEFT HEART CATH AND CORONARY ANGIOGRAPHY N/A 09/20/2017   Procedure: LEFT HEART CATH AND CORONARY ANGIOGRAPHY;  Surgeon: Leonie Man, MD;  Location: [place] [facility];  Service: Cardiovascular;  Laterality: N/A;  . LEFT HEART CATH AND CORONARY ANGIOGRAPHY N/A 06/30/2018   Procedure: LEFT HEART CATH AND CORONARY ANGIOGRAPHY;  Surgeon: Nelva Bush, MD;  Location: [place] [facility];  Service: Cardiovascular;  Laterality: N/A;  . SHOULDER ARTHROSCOPY  2012   left  . SHOULDER ARTHROSCOPY     right  . TEMPORARY PACEMAKER N/A 03/25/2017   Procedure: Temporary Pacemaker;  Surgeon: Sherren Mocha, MD;  Location: [place] [facility];  Service: Cardiovascular;  Laterality: N/A;  . TONSILLECTOMY    . TOTAL KNEE ARTHROPLASTY  2002   left    Current Outpatient Medications  Medication Sig Dispense Refill Last Dose  . albuterol (VENTOLIN HFA) 108 (90 Base) MCG/ACT inhaler Inhale 2 puffs into the lungs every 6 (six) hours as needed for wheezing or shortness of breath. 18 g 2   . amLODipine (NORVASC) 5 MG tablet Take 1 tablet (5 mg total) by mouth daily. 90 tablet 1   . atorvastatin (LIPITOR) 40 MG tablet Take 1 tablet (40 mg total) by mouth daily at 6 PM. 90 tablet 1   . clopidogrel (PLAVIX) 75 MG tablet Take 1 tablet (75 mg total) by mouth daily. 90 tablet 1   . CVS ASPIRIN LOW STRENGTH 81 MG EC tablet TAKE 1 TABLET BY MOUTH EVERY DAY 90 tablet 3 Taking  . furosemide (LASIX) 20 MG tablet Take 1 tablet (20 mg total) by mouth daily as needed for edema. 90 tablet 1   . hydrALAZINE (APRESOLINE) 50 MG tablet Take 1 tablet (50 mg total) by mouth every 8 (eight) hours. 270 tablet 1   .  HYDROcodone-acetaminophen (NORCO) 10-325 MG tablet Take 1 tablet by mouth every 6 (six) hours as needed for moderate pain or severe pain. 120 tablet 0   . isosorbide mononitrate (IMDUR) 30 MG 24 hr tablet Take 0.5 tablets (15 mg total) by mouth daily. 45 tablet 1   . losartan (COZAAR) 100 MG tablet Take 1 tablet (100 mg total) by mouth daily. 90 tablet 1   . metFORMIN (GLUCOPHAGE-XR) 500 MG 24 hr tablet Take 2 tablets (1,000 mg total) by mouth every evening. 180 tablet 1   . metoprolol tartrate (LOPRESSOR) 50 MG tablet Take 1 tablet (50 mg total) by mouth 2 (two) times daily. 180 tablet 1   . nitroGLYCERIN (NITROSTAT) 0.4 MG SL tablet Place 1 tablet (0.4 mg total) under the tongue every 5 (five) minutes x 3 doses as needed for chest pain. 25 tablet 11 Taking  . zolpidem (AMBIEN) 10 MG tablet Take 1 tablet (10 mg total) by mouth at bedtime as needed for sleep. 30 tablet 1    No current facility-administered medications for this visit.  No Known Allergies  Social History   Tobacco Use  . Smoking status: Former Smoker    Packs/day: 2.00    Years: 30.00    Pack years: 60.00    Quit date: 03/25/2017    Years since quitting: 2.0  . Smokeless tobacco: Never Used  Substance Use Topics  . Alcohol use: Yes    Alcohol/week: 3.0 standard drinks    Types: 1 Glasses of wine, 1 Cans of beer, 1 Shots of liquor per week    Comment: drinks daily  beer and whiskey    Family History  Problem Relation Age of Onset  . Hypertension Mother   . Hypertension Father   . Hypertension Brother   . Heart disease Maternal Grandfather      ROS  Objective:  Physical Exam  Vital signs in last 24 hours: @VSRANGES @  Labs:   Estimated body mass index is 30.56 kg/m as calculated from the following:   Height as of 04/11/19: 5\' 8"  (1.727 m).   Weight as of 04/11/19: 91.2 kg.   Imaging Review Plain radiographs demonstrate severe degenerative joint disease of the right hip(s). The bone quality appears to  be adequate for age and reported activity level.      Assessment/Plan:  End stage arthritis, right hip(s)  The patient history, physical examination, clinical judgement of the provider and imaging studies are consistent with end stage degenerative joint disease of the right hip(s) and total hip arthroplasty is deemed medically necessary. The treatment options including medical management, injection therapy, arthroscopy and arthroplasty were discussed at length. The risks and benefits of total hip arthroplasty were presented and reviewed. The risks due to aseptic loosening, infection, stiffness, dislocation/subluxation,  thromboembolic complications and other imponderables were discussed.  The patient acknowledged the explanation, agreed to proceed with the plan and consent was signed. Patient is being admitted for inpatient treatment for surgery, pain control, PT, OT, prophylactic antibiotics, VTE prophylaxis, progressive ambulation and ADL's and discharge planning.The patient is planning to be discharged home with HEP. Will likely need CIWA postop. (+) ASA, plavix   Anticipated LOS equal to or greater than 2 midnights due to - Age 70 and older with one or more of the following:  - Obesity  - Expected need for hospital services (PT, OT, Nursing) required for safe  discharge  - Anticipated need for postoperative skilled nursing care or inpatient rehab  - Active co-morbidities: Diabetes, Coronary Artery Disease, Heart Attack and Respiratory Failure/COPD OR   - Unanticipated findings during/Post Surgery: None  - Patient is a high risk of re-admission due to: None

## 2019-04-16 NOTE — Progress Notes (Signed)
Assessment & Plan:  1. Primary osteoarthritis of right hip/Chronic pain syndrome/Controlled substance agreement signed - Controlled substance agreement signed today. We will try to decrease Norco after patient has his hip surgery, then once everything has settled out we will try alternatives for sleep. He will also need x-rays of his back in the future.  - Compliance Drug Analysis, Ur   Return in about 3 months (around 07/19/2019) for DM, pain, sleep.  [name], MSN, APRN, FNP-C [facility]  Subjective:    Patient ID: [name] Foster, male    DOB: [birth year], 63 y.o.   MRN: [id number]  Patient Care Team: [name], FNP as PCP - General (Family Medicine) Sherren Mocha, MD as Consulting Physician (Cardiology) [facility] as Consulting Physician (Orthopedic Surgery)   Chief Complaint:  pain management recheck (needs pain contract)   HPI: [name] Foster is a 63 y.o. male presenting on 04/18/2019 for pain management recheck (needs pain contract)  [place] reviewed- Yes If yes- were their any concerning findings : No  Depression screen [facility] 2/9 04/18/2019 04/11/2019 08/28/2018 02/27/2018 10/25/2017  Decreased Interest 0 0 0 0 0  Down, Depressed, Hopeless 0 0 0 0 0  PHQ - 2 Score 0 0 0 0 0  Altered sleeping - 0 1 1 1   Tired, decreased energy - 0 3 1 1   Change in appetite - 0 1 1 0  Feeling bad or failure about yourself  - 0 0 0 0  Trouble concentrating - 0 0 0 0  Moving slowly or fidgety/restless - 0 0 0 0  Suicidal thoughts - 0 0 0 0  PHQ-9 Score - 0 5 3 2   Difficult doing work/chores - - Not difficult at all Not difficult at all Not difficult at all    [employer] drug screen performed- Yes  SOAPP  0= never  1= seldom  2=sometimes  3= often  4= very often  How often do you have mood swings? 1 How often do you smoke a cigarette within an hour after waking up? 0 How often have you taken medication  other than the way that it was prescribed? 0 How often have you used illegal drugs in the past 5 years? 0 How often, in your lifetime, have you had legal problems or been arrested? 1  Score 2  Alcohol Audit - How often during the last year have found that you: 0-Never   1- Less than monthly   2- Monthly     3-Weekly     4-daily or almost daily  - found that you were not able to stop drinking once you started- 0 - failed to do what was normally expected of you because of drinking- 0 - needed a first drink in the morning- 0 - had a feeling of guilt or remorse after drinking- 0 - are/were unable to remember what happened the night before because of your drinking- 0  0- NO   2- yes but not in last year  4- yes during last year - Have you or someone else been injured because of your drinking- 0 - Has anyone been concerned about your drinking or suggested you cut down- 2        TOTAL- 2  ( 0-7- alcohol education, 8-15- simple advice, 16-19 simple advice plus counseling, 20-40 referral for evaluation and treatment )  [place] in [place]  Pain assessment: Cause of pain- severe arthritis Pain  location- right hip and back Pain on scale of 1-10- 8/10 Frequency- daily What increases pain- movement, ADLs What makes pain better- rest, pain medication, heat  Effects on ADL - getting dressed is "rough", able to shower without difficulty  Prior treatments tried and failed- injections, Tylenol - not very effective. Not able to take NSAIDs due to Plavix. He took gabapentin for nerve pain which resolved with surgery but states it did not help his back and hip.  Current opioids rx- Nurco 10/325 PO Q6H PRN # prescribed- 120 Morphine mg equivalent- 40 MME  Pain management agreement reviewed and signed- Yes   Relevant past medical, surgical, family and social history reviewed and updated as indicated. Interim medical history since our last visit reviewed.  Allergies  and medications reviewed and updated.  DATA REVIEWED: CHART IN EPIC  ROS: Negative unless specifically indicated above in HPI.    Current Outpatient Medications:    albuterol (VENTOLIN HFA) 108 (90 Base) MCG/ACT inhaler, Inhale 2 puffs into the lungs every 6 (six) hours as needed for wheezing or shortness of breath., Disp: 18 g, Rfl: 2   amLODipine (NORVASC) 5 MG tablet, Take 1 tablet (5 mg total) by mouth daily., Disp: 90 tablet, Rfl: 1   atorvastatin (LIPITOR) 40 MG tablet, Take 1 tablet (40 mg total) by mouth daily at 6 PM., Disp: 90 tablet, Rfl: 1   clopidogrel (PLAVIX) 75 MG tablet, Take 1 tablet (75 mg total) by mouth daily., Disp: 90 tablet, Rfl: 1   Coenzyme Q10 (COQ10 PO), Take 1 capsule by mouth daily., Disp: , Rfl:    CVS ASPIRIN LOW STRENGTH 81 MG EC tablet, TAKE 1 TABLET BY MOUTH EVERY DAY (Patient taking differently: Take 81 mg by mouth daily. ), Disp: 90 tablet, Rfl: 3   furosemide (LASIX) 20 MG tablet, Take 1 tablet (20 mg total) by mouth daily as needed for edema., Disp: 90 tablet, Rfl: 1   hydrALAZINE (APRESOLINE) 50 MG tablet, Take 1 tablet (50 mg total) by mouth every 8 (eight) hours., Disp: 270 tablet, Rfl: 1   HYDROcodone-acetaminophen (NORCO) 10-325 MG tablet, Take 1 tablet by mouth every 6 (six) hours as needed for moderate pain or severe pain., Disp: 120 tablet, Rfl: 0   isosorbide mononitrate (IMDUR) 30 MG 24 hr tablet, Take 0.5 tablets (15 mg total) by mouth daily., Disp: 45 tablet, Rfl: 1   losartan (COZAAR) 100 MG tablet, Take 1 tablet (100 mg total) by mouth daily., Disp: 90 tablet, Rfl: 1   metFORMIN (GLUCOPHAGE-XR) 500 MG 24 hr tablet, Take 2 tablets (1,000 mg total) by mouth every evening., Disp: 180 tablet, Rfl: 1   metoprolol tartrate (LOPRESSOR) 50 MG tablet, Take 1 tablet (50 mg total) by mouth 2 (two) times daily., Disp: 180 tablet, Rfl: 1   nitroGLYCERIN (NITROSTAT) 0.4 MG SL tablet, Place 1 tablet (0.4 mg total) under the tongue every 5  (five) minutes x 3 doses as needed for chest pain., Disp: 25 tablet, Rfl: 11   Polyvinyl Alcohol (LUBRICANT DROPS OP), Place 1 drop into both eyes 3 (three) times daily as needed (allergy eyes)., Disp: , Rfl:    zolpidem (AMBIEN) 10 MG tablet, Take 1 tablet (10 mg total) by mouth at bedtime as needed for sleep., Disp: 30 tablet, Rfl: 1  No Known Allergies Past Medical History:  Diagnosis Date   CAD (coronary artery disease)    a. inferoposterior STEMI 03/25/17: LHC LM calcified w/o stenosis, pLAD 50%, m-dLCx 99% s/p PCI/DES w/ Promus 3.5  x 24 mm DES, ostial OM1 50%, small RCA with mRCA 50%; b. DES to left PDA 09/20/17 // c. 10/19: LCx and LPDA stents ok; s/p DES to pLAD and POBA to D1   Diabetes mellitus without complication ([place])    Heart attack ([place])    Hyperlipidemia    Hypertension    Insomnia    Pulmonary hypertension ([place])    a. TTE 03/25/17; EF 60-65%, nl WM, LV diastolic fxn nl, trivial AI, RV cavity size, wall thickness, and sys fxn nl, trivial TR, PASP 39 mmHg, trivial pericardial effusion   Sleep apnea    could not use a cpap   Wears dentures    full top-partial bottom   Wears glasses     Past Surgical History:  Procedure Laterality Date   COLONOSCOPY     CORONARY ATHERECTOMY N/A 07/03/2018   Procedure: CORONARY ATHERECTOMY;  Surgeon: Burnell Blanks, MD;  Location: [place] [facility];  Service: Cardiovascular;  Laterality: N/A;   CORONARY STENT INTERVENTION N/A 09/20/2017   Procedure: CORONARY STENT INTERVENTION;  Surgeon: Leonie Man, MD;  Location: [place] [facility];  Service: Cardiovascular;  Laterality: N/A;   CORONARY STENT INTERVENTION N/A 07/03/2018   Procedure: CORONARY STENT INTERVENTION;  Surgeon: Burnell Blanks, MD;  Location: [place] [facility];  Service: Cardiovascular;  Laterality: N/A;   CORONARY/GRAFT ACUTE MI REVASCULARIZATION N/A 03/25/2017   Procedure: Coronary/Graft Acute MI Revascularization;  Surgeon: Sherren Mocha,  MD;  Location: [place] [facility];  Service: Cardiovascular;  Laterality: N/A;   GANGLION CYST EXCISION Left 07/25/2014   Procedure: EXCISION VOLAR AND DORSAL GANGLION LEFT WRIST;  Surgeon: Leanora Cover, MD;  Location: [place];  Service: Orthopedics;  Laterality: Left;   INTRAVASCULAR PRESSURE WIRE/FFR STUDY N/A 09/20/2017   Procedure: INTRAVASCULAR PRESSURE WIRE/FFR STUDY;  Surgeon: Leonie Man, MD;  Location: [place] [facility];  Service: Cardiovascular;  Laterality: N/A;   INTRAVASCULAR PRESSURE WIRE/FFR STUDY N/A 06/30/2018   Procedure: INTRAVASCULAR PRESSURE WIRE/FFR STUDY;  Surgeon: Nelva Bush, MD;  Location: [place] [facility];  Service: Cardiovascular;  Laterality: N/A;   KNEE ARTHROSCOPY     rifgr and left   LEFT HEART CATH AND CORONARY ANGIOGRAPHY N/A 03/25/2017   Procedure: Left Heart Cath and Coronary Angiography;  Surgeon: Sherren Mocha, MD;  Location: [place] [facility];  Service: Cardiovascular;  Laterality: N/A;   LEFT HEART CATH AND CORONARY ANGIOGRAPHY N/A 09/20/2017   Procedure: LEFT HEART CATH AND CORONARY ANGIOGRAPHY;  Surgeon: Leonie Man, MD;  Location: [place] [facility];  Service: Cardiovascular;  Laterality: N/A;   LEFT HEART CATH AND CORONARY ANGIOGRAPHY N/A 06/30/2018   Procedure: LEFT HEART CATH AND CORONARY ANGIOGRAPHY;  Surgeon: Nelva Bush, MD;  Location: [place] [facility];  Service: Cardiovascular;  Laterality: N/A;   SHOULDER ARTHROSCOPY  2012   left   SHOULDER ARTHROSCOPY     right   TEMPORARY PACEMAKER N/A 03/25/2017   Procedure: Temporary Pacemaker;  Surgeon: Sherren Mocha, MD;  Location: [place] [facility];  Service: Cardiovascular;  Laterality: N/A;   TONSILLECTOMY     TOTAL KNEE ARTHROPLASTY  2002   left    Social History   Socioeconomic History   Marital status: Married    Spouse name: Not on file   Number of children: Not on file   Years of education: Not on file   Highest education level:  Not on file  Occupational History   Occupation: Retired  Science writer  Needs   Financial resource strain: Not on file   Food insecurity    Worry: Not on file    Inability: Not on file   Transportation needs    Medical: Not on file    Non-medical: Not on file  Tobacco Use   Smoking status: Former Smoker    Packs/day: 2.00    Years: 30.00    Pack years: 60.00    Quit date: 03/25/2017    Years since quitting: 2.0   Smokeless tobacco: Never Used  Substance and Sexual Activity   Alcohol use: Yes    Alcohol/week: 3.0 standard drinks    Types: 1 Glasses of wine, 1 Cans of beer, 1 Shots of liquor per week    Comment: drinks daily  beer and whiskey   Drug use: No   Sexual activity: Not Currently  Lifestyle   Physical activity    Days per week: Not on file    Minutes per session: Not on file   Stress: Not on file  Relationships   Social connections    Talks on phone: Not on file    Gets together: Not on file    Attends religious service: Not on file    Active member of club or organization: Not on file    Attends meetings of clubs or organizations: Not on file    Relationship status: Not on file   Intimate partner violence    Fear of current or ex partner: Not on file    Emotionally abused: Not on file    Physically abused: Not on file    Forced sexual activity: Not on file  Other Topics Concern   Not on file  Social History Narrative   Per wife, pt was preparing to retire soon from his job at [employer].        Objective:    BP (!) 159/92    Pulse 81    Temp 98 F (36.7 C) (Oral)    Ht 5\' 8"  (1.727 m)    Wt 203 lb 6.4 oz (92.3 kg)    BMI 30.93 kg/m    Physical Exam Vitals signs reviewed.  Constitutional:      General: He is not in acute distress.    Appearance: Normal appearance. He is obese. He is not ill-appearing, toxic-appearing or diaphoretic.  HENT:     Head: Normocephalic and atraumatic.  Eyes:     General: No scleral icterus.       Right eye: No  discharge.        Left eye: No discharge.     Conjunctiva/sclera: Conjunctivae normal.  Neck:     Musculoskeletal: Normal range of motion.  Cardiovascular:     Rate and Rhythm: Normal rate and regular rhythm.     Heart sounds: Normal heart sounds. No murmur. No friction rub. No gallop.   Pulmonary:     Effort: Pulmonary effort is normal. No respiratory distress.     Breath sounds: Normal breath sounds. No stridor. No wheezing, rhonchi or rales.  Musculoskeletal: Normal range of motion.  Skin:    General: Skin is warm and dry.  Neurological:     Mental Status: He is alert and oriented to person, place, and time. Mental status is at baseline.     Gait: Gait abnormal (ambulates with cane).  Psychiatric:        Mood and Affect: Mood normal.        Behavior: Behavior normal.  Thought Content: Thought content normal.        Judgment: Judgment normal.     No results found for: TSH Lab Results  Component Value Date   WBC 3.4 (L) 07/04/2018   HGB 12.1 (L) 07/04/2018   HCT 37.3 (L) 07/04/2018   MCV 89.7 07/04/2018   PLT 116 (L) 07/04/2018   Lab Results  Component Value Date   NA 139 04/11/2019   K 4.1 04/11/2019   CO2 22 04/11/2019   GLUCOSE 183 (H) 04/11/2019   BUN 25 04/11/2019   CREATININE 1.09 04/11/2019   BILITOT 0.4 04/11/2019   ALKPHOS 63 04/11/2019   AST 56 (H) 04/11/2019   ALT 59 (H) 04/11/2019   PROT 6.9 04/11/2019   ALBUMIN 4.6 04/11/2019   CALCIUM 9.8 04/11/2019   ANIONGAP 7 07/04/2018   Lab Results  Component Value Date   CHOL 118 09/29/2018   Lab Results  Component Value Date   HDL 45 09/29/2018   Lab Results  Component Value Date   LDLCALC 55 09/29/2018   Lab Results  Component Value Date   TRIG 90 09/29/2018   Lab Results  Component Value Date   CHOLHDL 2.6 09/29/2018   Lab Results  Component Value Date   HGBA1C 7.0 (H) 04/11/2019

## 2019-04-16 NOTE — H&P (Signed)
TOTAL HIP ADMISSION H&P  Patient is admitted for right total hip arthroplasty.  Subjective:  Chief Complaint: right hip pain  HPI: [name] Foster, 63 y.o. male, has a history of pain and functional disability in the right hip(s) due to arthritis and patient has failed non-surgical conservative treatments for greater than 12 weeks to include NSAID's and/or analgesics, flexibility and strengthening excercises, use of assistive devices, weight reduction as appropriate and activity modification.  Onset of symptoms was gradual starting 4 years ago with rapidlly worsening course since that time.The patient noted no past surgery on the right hip(s).  Patient currently rates pain in the right hip at 10 out of 10 with activity. Patient has night pain, worsening of pain with activity and weight bearing, trendelenberg gait, pain that interfers with activities of daily living and pain with passive range of motion. Patient has evidence of subchondral cysts, subchondral sclerosis, periarticular osteophytes and joint space narrowing by imaging studies. This condition presents safety issues increasing the risk of falls.  There is no current active infection.  Patient Active Problem List   Diagnosis Date Noted  . Osteoarthritis of right hip 11/21/2018  . Type 2 diabetes mellitus with complication ([place]) 30/16/0109  . History of ST elevation myocardial infarction (STEMI) 03/25/2017  . CAD (coronary artery disease) 03/25/2017  . Presence of drug-eluting stent in left circumflex coronary artery 03/25/2017  . Essential hypertension 12/01/2010  . Sleep apnea 12/01/2010  . Dyslipidemia, goal LDL below 70 12/01/2010   Past Medical History:  Diagnosis Date  . CAD (coronary artery disease)    a. 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 DES to left PDA 09/20/17 // c. 10/19: LCx and LPDA stents ok;  s/p DES to pLAD and POBA to D1  . Diabetes mellitus without complication ([place])   . Heart attack ([place])   . Hyperlipidemia   . Hypertension   . Insomnia   . Pulmonary hypertension ([place])    a. TTE 03/25/17; EF 60-65%, nl WM, LV diastolic fxn nl, trivial AI, RV cavity size, wall thickness, and sys fxn nl, trivial TR, PASP 39 mmHg, trivial pericardial effusion  . Sleep apnea    could not use a cpap  . Wears dentures    full top-partial bottom  . Wears glasses     Past Surgical History:  Procedure Laterality Date  . COLONOSCOPY    . CORONARY ATHERECTOMY N/A 07/03/2018   Procedure: CORONARY ATHERECTOMY;  Surgeon: Burnell Blanks, MD;  Location: [facility];  Service: Cardiovascular;  Laterality: N/A;  . CORONARY STENT INTERVENTION N/A 09/20/2017   Procedure: CORONARY STENT INTERVENTION;  Surgeon: Leonie Man, MD;  Location: [facility];  Service: Cardiovascular;  Laterality: N/A;  . CORONARY STENT INTERVENTION N/A 07/03/2018   Procedure: CORONARY STENT INTERVENTION;  Surgeon: Burnell Blanks, MD;  Location: [facility];  Service: Cardiovascular;  Laterality: N/A;  . CORONARY/GRAFT ACUTE MI REVASCULARIZATION N/A 03/25/2017   Procedure: Coronary/Graft Acute MI Revascularization;  Surgeon: Sherren Mocha, MD;  Location: [facility];  Service: Cardiovascular;  Laterality: N/A;  . GANGLION CYST EXCISION Left 07/25/2014   Procedure: EXCISION VOLAR AND DORSAL GANGLION LEFT WRIST;  Surgeon: Leanora Cover, MD;  Location: [facility];  Service: Orthopedics;  Laterality: Left;  . INTRAVASCULAR PRESSURE WIRE/FFR STUDY N/A 09/20/2017   Procedure: INTRAVASCULAR  PRESSURE WIRE/FFR STUDY;  Surgeon: Leonie Man, MD;  Location: [facility];  Service: Cardiovascular;  Laterality: N/A;  . INTRAVASCULAR PRESSURE WIRE/FFR STUDY N/A 06/30/2018   Procedure: INTRAVASCULAR PRESSURE WIRE/FFR STUDY;  Surgeon: Nelva Bush, MD;  Location: [facility];  Service: Cardiovascular;  Laterality: N/A;  . KNEE ARTHROSCOPY     rifgr and left  . LEFT HEART CATH AND CORONARY ANGIOGRAPHY N/A 03/25/2017   Procedure: Left Heart Cath and Coronary Angiography;  Surgeon: Sherren Mocha, MD;  Location: [facility];  Service: Cardiovascular;  Laterality: N/A;  . LEFT HEART CATH AND CORONARY ANGIOGRAPHY N/A 09/20/2017   Procedure: LEFT HEART CATH AND CORONARY ANGIOGRAPHY;  Surgeon: Leonie Man, MD;  Location: [facility];  Service: Cardiovascular;  Laterality: N/A;  . LEFT HEART CATH AND CORONARY ANGIOGRAPHY N/A 06/30/2018   Procedure: LEFT HEART CATH AND CORONARY ANGIOGRAPHY;  Surgeon: Nelva Bush, MD;  Location: [facility];  Service: Cardiovascular;  Laterality: N/A;  . SHOULDER ARTHROSCOPY  2012   left  . SHOULDER ARTHROSCOPY     right  . TEMPORARY PACEMAKER N/A 03/25/2017   Procedure: Temporary Pacemaker;  Surgeon: Sherren Mocha, MD;  Location: [facility];  Service: Cardiovascular;  Laterality: N/A;  . TONSILLECTOMY    . TOTAL KNEE ARTHROPLASTY  2002   left    Current Outpatient Medications  Medication Sig Dispense Refill Last Dose  . albuterol (VENTOLIN HFA) 108 (90 Base) MCG/ACT inhaler Inhale 2 puffs into the lungs every 6 (six) hours as needed for wheezing or shortness of breath. 18 g 2   . amLODipine (NORVASC) 5 MG tablet Take 1 tablet (5 mg total) by mouth daily. 90 tablet 1   . atorvastatin (LIPITOR) 40 MG tablet Take 1 tablet (40 mg total) by mouth daily at 6 PM. 90 tablet 1   . clopidogrel (PLAVIX) 75 MG tablet Take 1 tablet (75 mg total) by mouth daily. 90 tablet 1   . CVS ASPIRIN LOW STRENGTH 81 MG EC tablet TAKE 1 TABLET BY MOUTH EVERY DAY 90 tablet 3 Taking  . furosemide (LASIX) 20 MG tablet Take 1 tablet (20 mg total) by mouth daily as needed for edema. 90 tablet 1   . hydrALAZINE (APRESOLINE) 50 MG tablet Take 1 tablet (50 mg total) by mouth every 8 (eight) hours. 270 tablet 1   .  HYDROcodone-acetaminophen (NORCO) 10-325 MG tablet Take 1 tablet by mouth every 6 (six) hours as needed for moderate pain or severe pain. 120 tablet 0   . isosorbide mononitrate (IMDUR) 30 MG 24 hr tablet Take 0.5 tablets (15 mg total) by mouth daily. 45 tablet 1   . losartan (COZAAR) 100 MG tablet Take 1 tablet (100 mg total) by mouth daily. 90 tablet 1   . metFORMIN (GLUCOPHAGE-XR) 500 MG 24 hr tablet Take 2 tablets (1,000 mg total) by mouth every evening. 180 tablet 1   . metoprolol tartrate (LOPRESSOR) 50 MG tablet Take 1 tablet (50 mg total) by mouth 2 (two) times daily. 180 tablet 1   . nitroGLYCERIN (NITROSTAT) 0.4 MG SL tablet Place 1 tablet (0.4 mg total) under the tongue every 5 (five) minutes x 3 doses as needed for chest pain. 25 tablet 11 Taking  . zolpidem (AMBIEN) 10 MG tablet Take 1 tablet (10 mg total) by mouth at bedtime as needed for sleep. 30 tablet 1    No current facility-administered medications for this visit.  No Known Allergies  Social History   Tobacco Use  . Smoking status: Former Smoker    Packs/day: 2.00    Years: 30.00    Pack years: 60.00    Quit date: 03/25/2017    Years since quitting: 2.0  . Smokeless tobacco: Never Used  Substance Use Topics  . Alcohol use: Yes    Alcohol/week: 3.0 standard drinks    Types: 1 Glasses of wine, 1 Cans of beer, 1 Shots of liquor per week    Comment: drinks daily  beer and whiskey    Family History  Problem Relation Age of Onset  . Hypertension Mother   . Hypertension Father   . Hypertension Brother   . Heart disease Maternal Grandfather      ROS  Objective:  Physical Exam  Vital signs in last 24 hours: @VSRANGES @  Labs:   Estimated body mass index is 30.56 kg/m as calculated from the following:   Height as of 04/11/19: 5\' 8"  (1.727 m).   Weight as of 04/11/19: 91.2 kg.   Imaging Review Plain radiographs demonstrate severe degenerative joint disease of the right hip(s). The bone quality appears to  be adequate for age and reported activity level.      Assessment/Plan:  End stage arthritis, right hip(s)  The patient history, physical examination, clinical judgement of the provider and imaging studies are consistent with end stage degenerative joint disease of the right hip(s) and total hip arthroplasty is deemed medically necessary. The treatment options including medical management, injection therapy, arthroscopy and arthroplasty were discussed at length. The risks and benefits of total hip arthroplasty were presented and reviewed. The risks due to aseptic loosening, infection, stiffness, dislocation/subluxation,  thromboembolic complications and other imponderables were discussed.  The patient acknowledged the explanation, agreed to proceed with the plan and consent was signed. Patient is being admitted for inpatient treatment for surgery, pain control, PT, OT, prophylactic antibiotics, VTE prophylaxis, progressive ambulation and ADL's and discharge planning.The patient is planning to be discharged home with HEP. Will likely need CIWA postop. (+) ASA, plavix   Anticipated LOS equal to or greater than 2 midnights due to - Age 30 and older with one or more of the following:  - Obesity  - Expected need for hospital services (PT, OT, Nursing) required for safe  discharge  - Anticipated need for postoperative skilled nursing care or inpatient rehab  - Active co-morbidities: Diabetes, Coronary Artery Disease, Heart Attack and Respiratory Failure/COPD OR   - Unanticipated findings during/Post Surgery: None  - Patient is a high risk of re-admission due to: None

## 2019-04-17 ENCOUNTER — Other Ambulatory Visit: Payer: Self-pay

## 2019-04-17 NOTE — Progress Notes (Signed)
CARDIAC CLEARANCE, [name] Foster, [name]-MD 02-28-2019 Epic TELE NOTE   HGBA1C 04-11-2019 Epic   CXR 06-30-18 Epic   EKG 07-04-17 Epic   ECHO 2018 Epic   STRESS TEST 2012 Epic

## 2019-04-17 NOTE — Patient Instructions (Addendum)
YOU NEED TO HAVE A COVID 19 TEST ON___Saturday, august 8th__ @___9 :00__, THIS TEST MUST BE DONE BEFORE SURGERY, COME  [place], [name] [place] , [id number]. ONCE YOUR COVID TEST IS COMPLETED, PLEASE BEGIN THE QUARANTINE INSTRUCTIONS AS OUTLINED IN YOUR HANDOUT.                [name]     Your procedure is scheduled on: 04-25-2019    Report to [facility] Main  Entrance     Report to admitting at 6:00AM    [address].    Call this number if you have problems the morning of surgery [place] AND RINSE YOUR MOUTH OUT, NO CHEWING GUM CANDY OR MINTS.    Do not eat food After Midnight. YOU MAY HAVE CLEAR LIQUIDS FROM MIDNIGHT UNTIL 5:30AM. At 5:30AM Please finish the prescribed Pre-Surgery Gatorade drink. Nothing by mouth after you finish the Gatorade drink !    CLEAR LIQUID DIET   Foods Allowed                                                                     Foods Excluded  Coffee and tea, regular and decaf                             liquids that you cannot  Plain Jell-O any favor except red or purple                                           see through such as: Fruit ices (not with fruit pulp)                                     milk, soups, orange juice  Iced Popsicles                                    All solid food Carbonated beverages, regular and diet                                    Cranberry, grape and apple juices Sports drinks like Gatorade Lightly seasoned clear broth or consume(fat free) Sugar, honey syrup  Sample Menu Breakfast                                Lunch                                     Supper Cranberry juice                    Beef  broth                            Chicken broth Jell-O                                     Grape juice                           Apple juice Coffee or tea                        Jell-O                                       Popsicle                                                Coffee or tea                        Coffee or tea  _____________________________________________________________________     Take these medicines the morning of surgery with A SIP OF WATER: Amlodipine, hydralazine, isosorbide, hydrocodone if needed, albuterol inhaler if needed     DO NOT TAKE ANY DIABETES MEDICATION THE DAY OF SURGERY! PLEASE CHECK YOUR BLOOD SUGAR THE DAY OF SURGERY. REPORT TO YOUR NURSE ON ARRIVAL.  IF YOUR BLOOD SUGAR IS LESS THAN 70 THE MORNING SURGERY WHEN YOU CHECK IT AT HOME, DRINK 1/2 OF APPLE OR CRANBERRY JUICE THEN RECHECK YOUR BLOOD SUGAR AFTER 15 MINUTES, IF STILL BELOW 70,  YOU NEED TO CALL THE [phone] FOR FURTHER INSTRUCTIONS!                                 You may not have any metal on your body including hair pins and              piercings  Do not wear jewelry, make-up, lotions, powders or perfumes, deodorant             Do not wear nail polish.  Do not shave  48 hours prior to surgery.              Men may shave face and neck.   Do not bring valuables to the hospital. [place].  Contacts, dentures or bridgework may not be worn into surgery.                Please read over the following fact sheets you were given: _____________________________________________________________________             [facility] - Preparing for Surgery Before surgery, you can play an important role.  Because skin is not sterile, your skin needs to be as free of germs as possible.  You can reduce the number of germs on your skin by washing with CHG (chlorahexidine gluconate) soap before surgery.  CHG is an antiseptic cleaner which kills germs and  bonds with the skin to continue killing germs even after washing. Please DO NOT use if you have an allergy to CHG or antibacterial soaps.  If your skin becomes  reddened/irritated stop using the CHG and inform your nurse when you arrive at Short Stay. Do not shave (including legs and underarms) for at least 48 hours prior to the first CHG shower.  You may shave your face/neck. Please follow these instructions carefully:  1.  Shower with CHG Soap the night before surgery and the  morning of Surgery.  2.  If you choose to wash your hair, wash your hair first as usual with your  normal  shampoo.  3.  After you shampoo, rinse your hair and body thoroughly to remove the  shampoo.                           4.  Use CHG as you would any other liquid soap.  You can apply chg directly  to the skin and wash                       Gently with a scrungie or clean washcloth.  5.  Apply the CHG Soap to your body ONLY FROM THE NECK DOWN.   Do not use on face/ open                           Wound or open sores. Avoid contact with eyes, ears mouth and genitals (private parts).                       Wash face,  Genitals (private parts) with your normal soap.             6.  Wash thoroughly, paying special attention to the area where your surgery  will be performed.  7.  Thoroughly rinse your body with warm water from the neck down.  8.  DO NOT shower/wash with your normal soap after using and rinsing off  the CHG Soap.                9.  Pat yourself dry with a clean towel.            10.  Wear clean pajamas.            11.  Place clean sheets on your bed the night of your first shower and do not  sleep with pets. Day of Surgery : Do not apply any lotions/deodorants the morning of surgery.  Please wear clean clothes to the hospital/surgery center.  FAILURE TO FOLLOW THESE INSTRUCTIONS MAY RESULT IN THE CANCELLATION OF YOUR SURGERY PATIENT SIGNATURE_________________________________  NURSE SIGNATURE__________________________________  ________________________________________________________________________   Adam Phenix  An incentive spirometer is a tool that  can help keep your lungs clear and active. This tool measures how well you are filling your lungs with each breath. Taking long deep breaths may help reverse or decrease the chance of developing breathing (pulmonary) problems (especially infection) following:  A long period of time when you are unable to move or be active. BEFORE THE PROCEDURE   If the spirometer includes an indicator to show your best effort, your nurse or respiratory therapist will set it to a desired goal.  If possible, sit up straight or lean slightly forward. Try not to slouch.  Hold the incentive spirometer in an upright position.  INSTRUCTIONS FOR USE  1. Sit on the edge of your bed if possible, or sit up as far as you can in bed or on a chair. 2. Hold the incentive spirometer in an upright position. 3. Breathe out normally. 4. Place the mouthpiece in your mouth and seal your lips tightly around it. 5. Breathe in slowly and as deeply as possible, raising the piston or the ball toward the top of the column. 6. Hold your breath for 3-5 seconds or for as long as possible. Allow the piston or ball to fall to the bottom of the column. 7. Remove the mouthpiece from your mouth and breathe out normally. 8. Rest for a few seconds and repeat Steps 1 through 7 at least 10 times every 1-2 hours when you are awake. Take your time and take a few normal breaths between deep breaths. 9. The spirometer may include an indicator to show your best effort. Use the indicator as a goal to work toward during each repetition. 10. After each set of 10 deep breaths, practice coughing to be sure your lungs are clear. If you have an incision (the cut made at the time of surgery), support your incision when coughing by placing a pillow or rolled up towels firmly against it. Once you are able to get out of bed, walk around indoors and cough well. You may stop using the incentive spirometer when instructed by your caregiver.  RISKS AND  COMPLICATIONS  Take your time so you do not get dizzy or light-headed.  If you are in pain, you may need to take or ask for pain medication before doing incentive spirometry. It is harder to take a deep breath if you are having pain. AFTER USE  Rest and breathe slowly and easily.  It can be helpful to keep track of a log of your progress. Your caregiver can provide you with a simple table to help with this. If you are using the spirometer at home, follow these instructions: [place] IF:   You are having difficultly using the spirometer.  You have trouble using the spirometer as often as instructed.  Your pain medication is not giving enough relief while using the spirometer.  You develop fever of 100.5 F (38.1 C) or higher. SEEK IMMEDIATE MEDICAL CARE IF:   You cough up bloody sputum that had not been present before.  You develop fever of 102 F (38.9 C) or greater.  You develop worsening pain at or near the incision site. MAKE SURE YOU:   Understand these instructions.  Will watch your condition.  Will get help right away if you are not doing well or get worse. Document Released: 01/10/2007 Document Revised: 11/22/2011 Document Reviewed: 03/13/2007 ExitCare Patient Information 2014 ExitCare, [place].   ________________________________________________________________________  WHAT IS A BLOOD TRANSFUSION? Blood Transfusion Information  A transfusion is the replacement of blood or some of its parts. Blood is made up of multiple cells which provide different functions.  Red blood cells carry oxygen and are used for blood loss replacement.  White blood cells fight against infection.  Platelets control bleeding.  Plasma helps clot blood.  Other blood products are available for specialized needs, such as hemophilia or other clotting disorders. BEFORE THE TRANSFUSION  Who gives blood for transfusions?   Healthy volunteers who are fully evaluated to make sure  their blood is safe. This is blood bank blood. Transfusion therapy is the safest it has ever been in the practice of medicine. Before blood  is taken from a donor, a complete history is taken to make sure that person has no history of diseases nor engages in risky social behavior (examples are intravenous drug use or sexual activity with multiple partners). The donor's travel history is screened to minimize risk of transmitting infections, such as malaria. The donated blood is tested for signs of infectious diseases, such as HIV and hepatitis. The blood is then tested to be sure it is compatible with you in order to minimize the chance of a transfusion reaction. If you or a relative donates blood, this is often done in anticipation of surgery and is not appropriate for emergency situations. It takes many days to process the donated blood. RISKS AND COMPLICATIONS Although transfusion therapy is very safe and saves many lives, the main dangers of transfusion include:   Getting an infectious disease.  Developing a transfusion reaction. This is an allergic reaction to something in the blood you were given. Every precaution is taken to prevent this. The decision to have a blood transfusion has been considered carefully by your caregiver before blood is given. Blood is not given unless the benefits outweigh the risks. AFTER THE TRANSFUSION  Right after receiving a blood transfusion, you will usually feel much better and more energetic. This is especially true if your red blood cells have gotten low (anemic). The transfusion raises the level of the red blood cells which carry oxygen, and this usually causes an energy increase.  The nurse administering the transfusion will monitor you carefully for complications. HOME CARE INSTRUCTIONS  No special instructions are needed after a transfusion. You may find your energy is better. Speak with your caregiver about any limitations on activity for underlying diseases  you may have. SEEK MEDICAL CARE IF:   Your condition is not improving after your transfusion.  You develop redness or irritation at the intravenous (IV) site. SEEK IMMEDIATE MEDICAL CARE IF:  Any of the following symptoms occur over the next 12 hours:  Shaking chills.  You have a temperature by mouth above 102 F (38.9 C), not controlled by medicine.  Chest, back, or muscle pain.  People around you feel you are not acting correctly or are confused.  Shortness of breath or difficulty breathing.  Dizziness and fainting.  You get a rash or develop hives.  You have a decrease in urine output.  Your urine turns a dark color or changes to pink, red, or brown. Any of the following symptoms occur over the next 10 days:  You have a temperature by mouth above 102 F (38.9 C), not controlled by medicine.  Shortness of breath.  Weakness after normal activity.  The white part of the eye turns yellow (jaundice).  You have a decrease in the amount of urine or are urinating less often.  Your urine turns a dark color or changes to pink, red, or brown. Document Released: 08/27/2000 Document Revised: 11/22/2011 Document Reviewed: 04/15/2008 [facility] Patient Information 2014 [place], [place].  _______________________________________________________________________

## 2019-04-18 ENCOUNTER — Encounter: Payer: Self-pay | Admitting: Family Medicine

## 2019-04-18 ENCOUNTER — Encounter (HOSPITAL_COMMUNITY): Payer: 59

## 2019-04-18 ENCOUNTER — Ambulatory Visit (INDEPENDENT_AMBULATORY_CARE_PROVIDER_SITE_OTHER): Payer: 59 | Admitting: Family Medicine

## 2019-04-18 VITALS — BP 159/92 | HR 81 | Temp 98.0°F | Ht 68.0 in | Wt 203.4 lb

## 2019-04-18 DIAGNOSIS — Z79899 Other long term (current) drug therapy: Secondary | ICD-10-CM | POA: Diagnosis not present

## 2019-04-18 DIAGNOSIS — M1611 Unilateral primary osteoarthritis, right hip: Secondary | ICD-10-CM

## 2019-04-18 DIAGNOSIS — G894 Chronic pain syndrome: Secondary | ICD-10-CM

## 2019-04-19 ENCOUNTER — Other Ambulatory Visit: Payer: Self-pay

## 2019-04-19 ENCOUNTER — Encounter (HOSPITAL_COMMUNITY)
Admission: RE | Admit: 2019-04-19 | Discharge: 2019-04-19 | Disposition: A | Discharge status: Home / Self Care | Payer: 59 | Source: Ambulatory Visit | Attending: Orthopedic Surgery | Admitting: Orthopedic Surgery

## 2019-04-19 ENCOUNTER — Encounter (HOSPITAL_COMMUNITY): Payer: Self-pay

## 2019-04-19 DIAGNOSIS — I10 Essential (primary) hypertension: Secondary | ICD-10-CM | POA: Diagnosis not present

## 2019-04-19 DIAGNOSIS — Z01818 Encounter for other preprocedural examination: Secondary | ICD-10-CM | POA: Diagnosis present

## 2019-04-19 DIAGNOSIS — E119 Type 2 diabetes mellitus without complications: Secondary | ICD-10-CM | POA: Diagnosis not present

## 2019-04-19 DIAGNOSIS — G4730 Sleep apnea, unspecified: Secondary | ICD-10-CM | POA: Diagnosis not present

## 2019-04-19 DIAGNOSIS — E785 Hyperlipidemia, unspecified: Secondary | ICD-10-CM | POA: Insufficient documentation

## 2019-04-19 DIAGNOSIS — I252 Old myocardial infarction: Secondary | ICD-10-CM | POA: Diagnosis not present

## 2019-04-19 DIAGNOSIS — Z86711 Personal history of pulmonary embolism: Secondary | ICD-10-CM | POA: Insufficient documentation

## 2019-04-19 DIAGNOSIS — Z955 Presence of coronary angioplasty implant and graft: Secondary | ICD-10-CM | POA: Diagnosis not present

## 2019-04-19 DIAGNOSIS — Z87891 Personal history of nicotine dependence: Secondary | ICD-10-CM | POA: Diagnosis not present

## 2019-04-19 DIAGNOSIS — I2510 Atherosclerotic heart disease of native coronary artery without angina pectoris: Secondary | ICD-10-CM | POA: Diagnosis not present

## 2019-04-19 DIAGNOSIS — Z7982 Long term (current) use of aspirin: Secondary | ICD-10-CM | POA: Diagnosis not present

## 2019-04-19 DIAGNOSIS — Z20828 Contact with and (suspected) exposure to other viral communicable diseases: Secondary | ICD-10-CM | POA: Diagnosis not present

## 2019-04-19 DIAGNOSIS — M1611 Unilateral primary osteoarthritis, right hip: Secondary | ICD-10-CM | POA: Insufficient documentation

## 2019-04-19 DIAGNOSIS — Z7984 Long term (current) use of oral hypoglycemic drugs: Secondary | ICD-10-CM | POA: Insufficient documentation

## 2019-04-19 DIAGNOSIS — Z7902 Long term (current) use of antithrombotics/antiplatelets: Secondary | ICD-10-CM | POA: Diagnosis not present

## 2019-04-19 DIAGNOSIS — Z79899 Other long term (current) drug therapy: Secondary | ICD-10-CM | POA: Diagnosis not present

## 2019-04-19 LAB — COMPREHENSIVE METABOLIC PANEL
ALT: 47 U/L — ABNORMAL HIGH (ref 0–44)
AST: 40 U/L (ref 15–41)
Albumin: 4.1 g/dL (ref 3.5–5.0)
Alkaline Phosphatase: 54 U/L (ref 38–126)
Anion gap: 12 (ref 5–15)
BUN: 23 mg/dL (ref 8–23)
CO2: 25 mmol/L (ref 22–32)
Calcium: 9 mg/dL (ref 8.9–10.3)
Chloride: 103 mmol/L (ref 98–111)
Creatinine, Ser: 1.16 mg/dL (ref 0.61–1.24)
GFR calc Af Amer: >60 mL/min (ref >60)
GFR calc non Af Amer: >60 mL/min (ref >60)
Glucose, Bld: 134 mg/dL — ABNORMAL HIGH (ref 70–99)
Potassium: 4.5 mmol/L (ref 3.5–5.1)
Sodium: 140 mmol/L (ref 135–145)
Total Bilirubin: 0.6 mg/dL (ref 0.3–1.2)
Total Protein: 7.2 g/dL (ref 6.5–8.1)

## 2019-04-19 LAB — CBC
HCT: 36.6 % — ABNORMAL LOW (ref 39.0–52.0)
Hemoglobin: 11.7 g/dL — ABNORMAL LOW (ref 13.0–17.0)
MCH: 29.6 pg (ref 26.0–34.0)
MCHC: 32 g/dL (ref 30.0–36.0)
MCV: 92.7 fL (ref 80.0–100.0)
Platelets: 147 10*3/uL — ABNORMAL LOW (ref 150–400)
RBC: 3.95 MIL/uL — ABNORMAL LOW (ref 4.22–5.81)
RDW: 13.5 % (ref 11.5–15.5)
WBC: 5.4 10*3/uL (ref 4.0–10.5)
nRBC: 0 % (ref 0.0–0.2)

## 2019-04-19 LAB — PROTIME-INR
INR: 0.9 (ref 0.8–1.2)
Prothrombin Time: 12 seconds (ref 11.4–15.2)

## 2019-04-19 LAB — URINALYSIS, ROUTINE W REFLEX MICROSCOPIC
Bilirubin Urine: NEGATIVE
Glucose, UA: NEGATIVE mg/dL
Hgb urine dipstick: NEGATIVE
Ketones, ur: NEGATIVE mg/dL
Leukocytes,Ua: NEGATIVE
Nitrite: NEGATIVE
Protein, ur: 100 mg/dL — AB
Specific Gravity, Urine: 1.014 (ref 1.005–1.030)
pH: 5 (ref 5.0–8.0)

## 2019-04-19 LAB — SURGICAL PCR SCREEN
MRSA, PCR: NEGATIVE
Staphylococcus aureus: NEGATIVE

## 2019-04-19 LAB — ABO/RH: ABO/RH(D): A POS

## 2019-04-19 LAB — GLUCOSE, CAPILLARY: Glucose-Capillary: 131 mg/dL — ABNORMAL HIGH (ref 70–99)

## 2019-04-21 ENCOUNTER — Other Ambulatory Visit (HOSPITAL_COMMUNITY)
Admission: RE | Admit: 2019-04-21 | Discharge: 2019-04-21 | Disposition: A | Discharge status: Home / Self Care | Payer: 59 | Source: Ambulatory Visit | Attending: Orthopedic Surgery | Admitting: Orthopedic Surgery

## 2019-04-21 DIAGNOSIS — Z01818 Encounter for other preprocedural examination: Secondary | ICD-10-CM | POA: Diagnosis not present

## 2019-04-21 LAB — COMPLIANCE DRUG ANALYSIS, UR

## 2019-04-21 LAB — SARS CORONAVIRUS 2 (TAT 6-24 HRS): SARS Coronavirus 2: NEGATIVE

## 2019-04-23 NOTE — Anesthesia Preprocedure Evaluation (Addendum)
Anesthesia Evaluation  Patient identified by MRN, date of birth, ID band Patient awake    Reviewed: Allergy & Precautions, NPO status , Patient's Chart, lab work & pertinent test results, reviewed documented beta blocker date and time   Airway Mallampati: II  TM Distance: >3 FB Neck ROM: Full    Dental  (+) Dental Advisory Given   Pulmonary sleep apnea , former smoker,    breath sounds clear to auscultation       Cardiovascular hypertension, Pt. on medications and Pt. on home beta blockers + CAD, + Past MI and + Cardiac Stents   Rhythm:Regular Rate:Normal     Neuro/Psych negative neurological ROS     GI/Hepatic negative GI ROS, Neg liver ROS,   Endo/Other  diabetes, Type 2  Renal/GU negative Renal ROS     Musculoskeletal  (+) Arthritis ,   Abdominal   Peds  Hematology  (+) anemia ,   Anesthesia Other Findings   Reproductive/Obstetrics                            Lab Results  Component Value Date   WBC 5.4 04/19/2019   HGB 11.7 (L) 04/19/2019   HCT 36.6 (L) 04/19/2019   MCV 92.7 04/19/2019   PLT 147 (L) 04/19/2019   Lab Results  Component Value Date   CREATININE 1.16 04/19/2019   BUN 23 04/19/2019   NA 140 04/19/2019   K 4.5 04/19/2019   CL 103 04/19/2019   CO2 25 04/19/2019   COVID-19 Labs  No results for input(s): DDIMER, FERRITIN, LDH, CRP in the last 72 hours.  Lab Results  Component Value Date   [place] NEGATIVE 04/21/2019    Anesthesia Physical Anesthesia Plan  ASA: III  Anesthesia Plan: General   Post-op Pain Management:    Induction: Intravenous  PONV Risk Score and Plan: 2 and Dexamethasone, Ondansetron and Treatment may vary due to age or medical condition  Airway Management Planned: Oral ETT  Additional Equipment:   Intra-op Plan:   Post-operative Plan: Extubation in OR  Informed Consent: I have reviewed the patients History and Physical,  chart, labs and discussed the procedure including the risks, benefits and alternatives for the proposed anesthesia with the patient or authorized representative who has indicated his/her understanding and acceptance.     Dental advisory given  Plan Discussed with: CRNA  Anesthesia Plan Comments: (See PAT note 04/19/2019, Konrad Felix, PA-C)       Anesthesia Quick Evaluation

## 2019-04-23 NOTE — Progress Notes (Addendum)
Anesthesia Chart Review   Case: [id number] Date/Time: 04/25/19 0815   Procedure: TOTAL HIP ARTHROPLASTY ANTERIOR APPROACH (Right )   Anesthesia type: Spinal   Pre-op diagnosis: Degenerative joint disease right hip   Location: [facility]OM 07 / [facility]   Surgeon: Rod Can, MD      DISCUSSION:63 y.o. former smoker (60 pack years, quit 03/25/2017) with h/o HTN, HLD, CAD (MI 03/2017 s/p PCI, angina 09/2017 s/p PCI, and [place] 06/2018 s/p PTCA/DES), PE, DM II, sleep apnea, right hip DJD scheduled for above procedure 04/25/2019 with Dr. Rod Can.   Cardiac clearance received 03/06/2019.  Per Melina Copa, PA-C, "[name] was last seen on 12/19/18 by Richardson Dopp via video visit. He has history of CAD (MI 03/2017 s/p PCI, angina 09/2017 s/p PCI, and [place] 06/2018 s/p PTCA/DES) with DM, HTN, HLD, preserved LVEF. He affirms he is doing well without any new cardiac symptoms. He has remained physically active albeit a little less than usual due to hip pain. He is still able to exercise over 4 METS without any angina or SOB. Therefore, based on ACC/AHA guidelines, the patient would be at acceptable risk for the planned procedure without further cardiovascular testing.  Per Dr. Burt Knack, "I think since he is > 6 months out from PCI he can hold plavix as requested for surgery. Ideally he would stay on ASA 81 mg perioperatively if his bleeding risk is not too high."  Platelets 147 at PAT 04/19/2019.   Anticipate pt can proceed with planned procedure barring acute status change.   VS: BP 138/85   Pulse 76   Temp 36.8 C (Oral)   Resp 18   Ht 5\' 9"  (1.753 m)   Wt 92.5 kg   SpO2 97%   BMI 30.13 kg/m   PROVIDERS: [name], FNP is PCP   Sherren Mocha, MD is Cardiologist  LABS: Labs reviewed: Acceptable for surgery. (all labs ordered are listed, but only abnormal results are displayed)  Labs Reviewed  CBC - Abnormal; Notable for the following components:      Result Value   RBC 3.95 (*)    Hemoglobin 11.7 (*)    HCT 36.6 (*)    Platelets 147 (*)    All other components within normal limits  COMPREHENSIVE METABOLIC PANEL - Abnormal; Notable for the following components:   Glucose, Bld 134 (*)    ALT 47 (*)    All other components within normal limits  URINALYSIS, ROUTINE W REFLEX MICROSCOPIC - Abnormal; Notable for the following components:   Protein, ur 100 (*)    Bacteria, UA FEW (*)    All other components within normal limits  GLUCOSE, CAPILLARY - Abnormal; Notable for the following components:   Glucose-Capillary 131 (*)    All other components within normal limits  SURGICAL PCR SCREEN  PROTIME-INR  TYPE AND SCREEN  ABO/RH     IMAGES:   EKG: 07/04/2018 Rate 60 bpm Incomplete right bundle branch block  Poor R wave progression   Abnormal ECG  No significant change since last tracing yesterday  CV: Cardiac Cath 07/03/2018  Prox LAD lesion is 70% stenosed.  A drug-eluting stent was successfully placed using a STENT SIERRA 2.50 X 15 MM.  Post intervention, there is a 0% residual stenosis.   1. Severe, heavily calcified mid LAD stenosis 2. Successful PTCA/orbital atherectomy/DES x 1 mid LAD  Recommendations:Likely discharge home tomorrow.  Recommend uninterrupted dual antiplatelet therapy with Aspirin 81mg  daily and Clopidogrel 75mg  daily  for a minimum of 12 months (ACS - Class I recommendation).  Echo 03/25/2017 Study Conclusions  - Left ventricle: The cavity size was normal. Systolic function was   normal. The estimated ejection fraction was in the range of 60%   to 65%. Wall motion was normal; there were no regional wall   motion abnormalities. Left ventricular diastolic function   parameters were normal. - Aortic valve: Transvalvular velocity was within the normal range.   There was no stenosis. There was trivial regurgitation. - Mitral valve: Transvalvular velocity was within the normal range.   There was no evidence for stenosis. There  was no regurgitation. - Right ventricle: The cavity size was normal. Wall thickness was   normal. Systolic function was normal. - Tricuspid valve: There was trivial regurgitation. - Pulmonary arteries: Systolic pressure was within the normal   range. PA peak pressure: 39 mm Hg (S). - Pericardium, extracardiac: A trivial pericardial effusion was   identified. Past Medical History:  Diagnosis Date  . CAD (coronary artery disease)    a. 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 DES to left PDA 09/20/17 // c. 10/19: LCx and LPDA stents ok; s/p DES to pLAD and POBA to D1  . Diabetes mellitus without complication ([place])    type 2   . Heart attack ([place])    " a blood clot is actually what caused my heart attack in 2018 " , he states he is unsure where the blood clot originated from   . Hyperlipidemia   . Hypertension   . Insomnia   . Pulmonary hypertension ([place])    a. TTE 03/25/17; EF 60-65%, nl WM, LV diastolic fxn nl, trivial AI, RV cavity size, wall thickness, and sys fxn nl, trivial TR, PASP 39 mmHg, trivial pericardial effusion  . Sleep apnea    could not use a cpap  . Wears dentures    full top-partial bottom  . Wears glasses     Past Surgical History:  Procedure Laterality Date  . COLONOSCOPY    . CORONARY ATHERECTOMY N/A 07/03/2018   Procedure: CORONARY ATHERECTOMY;  Surgeon: Burnell Blanks, MD;  Location: [facility];  Service: Cardiovascular;  Laterality: N/A;  . CORONARY STENT INTERVENTION N/A 09/20/2017   Procedure: CORONARY STENT INTERVENTION;  Surgeon: Leonie Man, MD;  Location: [place] [facility];  Service: Cardiovascular;  Laterality: N/A;  . CORONARY STENT INTERVENTION N/A 07/03/2018   Procedure: CORONARY STENT INTERVENTION;  Surgeon: Burnell Blanks, MD;  Location: [place] [facility];  Service: Cardiovascular;  Laterality:  N/A;  . CORONARY/GRAFT ACUTE MI REVASCULARIZATION N/A 03/25/2017   Procedure: Coronary/Graft Acute MI Revascularization;  Surgeon: Sherren Mocha, MD;  Location: [place] [facility];  Service: Cardiovascular;  Laterality: N/A;  . GANGLION CYST EXCISION Left 07/25/2014   Procedure: EXCISION VOLAR AND DORSAL GANGLION LEFT WRIST;  Surgeon: Leanora Cover, MD;  Location: [place];  Service: Orthopedics;  Laterality: Left;  . INTRAVASCULAR PRESSURE WIRE/FFR STUDY N/A 09/20/2017   Procedure: INTRAVASCULAR PRESSURE WIRE/FFR STUDY;  Surgeon: Leonie Man, MD;  Location: [place] [facility];  Service: Cardiovascular;  Laterality: N/A;  . INTRAVASCULAR PRESSURE WIRE/FFR STUDY N/A 06/30/2018   Procedure: INTRAVASCULAR PRESSURE WIRE/FFR STUDY;  Surgeon: Nelva Bush, MD;  Location: [place] [facility];  Service: Cardiovascular;  Laterality: N/A;  . KNEE ARTHROSCOPY  rifgr and left  . LEFT HEART CATH AND CORONARY ANGIOGRAPHY N/A 03/25/2017   Procedure: Left Heart Cath and Coronary Angiography;  Surgeon: Sherren Mocha, MD;  Location: [place] [facility];  Service: Cardiovascular;  Laterality: N/A;  . LEFT HEART CATH AND CORONARY ANGIOGRAPHY N/A 09/20/2017   Procedure: LEFT HEART CATH AND CORONARY ANGIOGRAPHY;  Surgeon: Leonie Man, MD;  Location: [place];  Service: Cardiovascular;  Laterality: N/A;  . LEFT HEART CATH AND CORONARY ANGIOGRAPHY N/A 06/30/2018   Procedure: LEFT HEART CATH AND CORONARY ANGIOGRAPHY;  Surgeon: Nelva Bush, MD;  Location: [place] [facility];  Service: Cardiovascular;  Laterality: N/A;  . SHOULDER ARTHROSCOPY  2012   left  . SHOULDER ARTHROSCOPY     right  . TEMPORARY PACEMAKER N/A 03/25/2017   Procedure: Temporary Pacemaker;  Surgeon: Sherren Mocha, MD;  Location: [place] [facility];  Service: Cardiovascular;  Laterality: N/A;  . TONSILLECTOMY    . TOTAL KNEE ARTHROPLASTY  2002   left    MEDICATIONS: . albuterol (VENTOLIN HFA) 108 (90  Base) MCG/ACT inhaler  . amLODipine (NORVASC) 5 MG tablet  . atorvastatin (LIPITOR) 40 MG tablet  . clopidogrel (PLAVIX) 75 MG tablet  . Coenzyme Q10 (COQ10 PO)  . CVS ASPIRIN LOW STRENGTH 81 MG EC tablet  . furosemide (LASIX) 20 MG tablet  . hydrALAZINE (APRESOLINE) 50 MG tablet  . HYDROcodone-acetaminophen (NORCO) 10-325 MG tablet  . isosorbide mononitrate (IMDUR) 30 MG 24 hr tablet  . losartan (COZAAR) 100 MG tablet  . metFORMIN (GLUCOPHAGE-XR) 500 MG 24 hr tablet  . metoprolol tartrate (LOPRESSOR) 50 MG tablet  . nitroGLYCERIN (NITROSTAT) 0.4 MG SL tablet  . Polyvinyl Alcohol (LUBRICANT DROPS OP)  . zolpidem (AMBIEN) 10 MG tablet   No current facility-administered medications for this encounter.      Maia Plan [facility] Pre-Surgical Testing [phone] 04/23/19  2:22 PM

## 2019-04-25 ENCOUNTER — Encounter (HOSPITAL_COMMUNITY)
Admission: RE | Disposition: A | Discharge status: Home / Self Care | Payer: Self-pay | Source: Home / Self Care | Attending: Orthopedic Surgery

## 2019-04-25 ENCOUNTER — Inpatient Hospital Stay (HOSPITAL_COMMUNITY): Payer: 59

## 2019-04-25 ENCOUNTER — Inpatient Hospital Stay (HOSPITAL_COMMUNITY): Payer: 59 | Admitting: Certified Registered"

## 2019-04-25 ENCOUNTER — Inpatient Hospital Stay (HOSPITAL_COMMUNITY)
Admission: RE | Admit: 2019-04-25 | Discharge: 2019-04-27 | DRG: 470 | Disposition: A | Discharge status: Home / Self Care | Payer: 59 | Attending: Orthopedic Surgery | Admitting: Orthopedic Surgery

## 2019-04-25 ENCOUNTER — Inpatient Hospital Stay (HOSPITAL_COMMUNITY): Payer: 59 | Admitting: Physician Assistant

## 2019-04-25 ENCOUNTER — Encounter (HOSPITAL_COMMUNITY): Payer: Self-pay | Admitting: Emergency Medicine

## 2019-04-25 ENCOUNTER — Other Ambulatory Visit: Payer: Self-pay

## 2019-04-25 DIAGNOSIS — Z87891 Personal history of nicotine dependence: Secondary | ICD-10-CM | POA: Diagnosis not present

## 2019-04-25 DIAGNOSIS — Z955 Presence of coronary angioplasty implant and graft: Secondary | ICD-10-CM | POA: Diagnosis not present

## 2019-04-25 DIAGNOSIS — I10 Essential (primary) hypertension: Secondary | ICD-10-CM | POA: Diagnosis present

## 2019-04-25 DIAGNOSIS — Z7982 Long term (current) use of aspirin: Secondary | ICD-10-CM | POA: Diagnosis not present

## 2019-04-25 DIAGNOSIS — I252 Old myocardial infarction: Secondary | ICD-10-CM | POA: Diagnosis not present

## 2019-04-25 DIAGNOSIS — E119 Type 2 diabetes mellitus without complications: Secondary | ICD-10-CM | POA: Diagnosis present

## 2019-04-25 DIAGNOSIS — Z96652 Presence of left artificial knee joint: Secondary | ICD-10-CM | POA: Diagnosis present

## 2019-04-25 DIAGNOSIS — Z7984 Long term (current) use of oral hypoglycemic drugs: Secondary | ICD-10-CM | POA: Diagnosis not present

## 2019-04-25 DIAGNOSIS — Z419 Encounter for procedure for purposes other than remedying health state, unspecified: Secondary | ICD-10-CM

## 2019-04-25 DIAGNOSIS — E669 Obesity, unspecified: Secondary | ICD-10-CM | POA: Diagnosis present

## 2019-04-25 DIAGNOSIS — Z7902 Long term (current) use of antithrombotics/antiplatelets: Secondary | ICD-10-CM

## 2019-04-25 DIAGNOSIS — E785 Hyperlipidemia, unspecified: Secondary | ICD-10-CM | POA: Diagnosis present

## 2019-04-25 DIAGNOSIS — G4730 Sleep apnea, unspecified: Secondary | ICD-10-CM | POA: Diagnosis present

## 2019-04-25 DIAGNOSIS — M1611 Unilateral primary osteoarthritis, right hip: Principal | ICD-10-CM | POA: Diagnosis present

## 2019-04-25 DIAGNOSIS — I2510 Atherosclerotic heart disease of native coronary artery without angina pectoris: Secondary | ICD-10-CM | POA: Diagnosis present

## 2019-04-25 DIAGNOSIS — I272 Pulmonary hypertension, unspecified: Secondary | ICD-10-CM | POA: Diagnosis present

## 2019-04-25 DIAGNOSIS — Z8249 Family history of ischemic heart disease and other diseases of the circulatory system: Secondary | ICD-10-CM | POA: Diagnosis not present

## 2019-04-25 DIAGNOSIS — Z79899 Other long term (current) drug therapy: Secondary | ICD-10-CM

## 2019-04-25 DIAGNOSIS — Z6830 Body mass index (BMI) 30.0-30.9, adult: Secondary | ICD-10-CM | POA: Diagnosis not present

## 2019-04-25 DIAGNOSIS — Z09 Encounter for follow-up examination after completed treatment for conditions other than malignant neoplasm: Secondary | ICD-10-CM

## 2019-04-25 HISTORY — PX: TOTAL HIP ARTHROPLASTY: SHX124

## 2019-04-25 LAB — GLUCOSE, CAPILLARY
Glucose-Capillary: 181 mg/dL — ABNORMAL HIGH (ref 70–99)
Glucose-Capillary: 214 mg/dL — ABNORMAL HIGH (ref 70–99)
Glucose-Capillary: 278 mg/dL — ABNORMAL HIGH (ref 70–99)
Glucose-Capillary: 355 mg/dL — ABNORMAL HIGH (ref 70–99)

## 2019-04-25 LAB — HEMOGLOBIN A1C
Hgb A1c MFr Bld: 6.9 % — ABNORMAL HIGH (ref 4.8–5.6)
Mean Plasma Glucose: 151.33 mg/dL

## 2019-04-25 LAB — TYPE AND SCREEN
ABO/RH(D): A POS
Antibody Screen: NEGATIVE

## 2019-04-25 SURGERY — ARTHROPLASTY, HIP, TOTAL, ANTERIOR APPROACH
Anesthesia: General | Site: Hip | Laterality: Right

## 2019-04-25 MED ORDER — ALBUTEROL SULFATE HFA 108 (90 BASE) MCG/ACT IN AERS
INHALATION_SPRAY | RESPIRATORY_TRACT | Status: AC
  Filled 2019-04-25: qty 6.7

## 2019-04-25 MED ORDER — PHENYLEPHRINE 40 MCG/ML (10ML) SYRINGE FOR IV PUSH (FOR BLOOD PRESSURE SUPPORT)
PREFILLED_SYRINGE | INTRAVENOUS | Status: AC
  Filled 2019-04-25: qty 10

## 2019-04-25 MED ORDER — ISOPROPYL ALCOHOL 70 % SOLN
Status: DC | PRN
  Administered 2019-04-25: 1 via TOPICAL

## 2019-04-25 MED ORDER — DIPHENHYDRAMINE HCL 12.5 MG/5ML PO ELIX: 12.5000 mg | ORAL_SOLUTION | ORAL | Status: DC | PRN

## 2019-04-25 MED ORDER — CEFAZOLIN SODIUM-DEXTROSE 2-4 GM/100ML-% IV SOLN
2.0000 g | Freq: Four times a day (QID) | INTRAVENOUS | Status: AC
  Administered 2019-04-25 (×2): 2 g via INTRAVENOUS
  Filled 2019-04-25 (×2): qty 100

## 2019-04-25 MED ORDER — FENTANYL CITRATE (PF) 250 MCG/5ML IJ SOLN
INTRAMUSCULAR | Status: AC
  Filled 2019-04-25: qty 5

## 2019-04-25 MED ORDER — LORAZEPAM 1 MG PO TABS: 0.0000 mg | ORAL_TABLET | Freq: Two times a day (BID) | ORAL | Status: DC

## 2019-04-25 MED ORDER — FUROSEMIDE 20 MG PO TABS: 20.0000 mg | ORAL_TABLET | Freq: Every day | ORAL | Status: DC | PRN

## 2019-04-25 MED ORDER — THIAMINE HCL 100 MG/ML IJ SOLN
100.0000 mg | Freq: Every day | INTRAMUSCULAR | Status: DC
  Filled 2019-04-25 (×3): qty 1

## 2019-04-25 MED ORDER — LIDOCAINE 2% (20 MG/ML) 5 ML SYRINGE
INTRAMUSCULAR | Status: AC
  Filled 2019-04-25: qty 10

## 2019-04-25 MED ORDER — METOPROLOL TARTRATE 50 MG PO TABS
50.0000 mg | ORAL_TABLET | Freq: Two times a day (BID) | ORAL | Status: DC
  Administered 2019-04-25 – 2019-04-26 (×3): 50 mg via ORAL
  Filled 2019-04-25 (×3): qty 1

## 2019-04-25 MED ORDER — ADULT MULTIVITAMIN W/MINERALS CH
1.0000 | ORAL_TABLET | Freq: Every day | ORAL | Status: DC
  Administered 2019-04-25 – 2019-04-26 (×2): 1 via ORAL
  Filled 2019-04-25 (×2): qty 1

## 2019-04-25 MED ORDER — ONDANSETRON HCL 4 MG/2ML IJ SOLN
INTRAMUSCULAR | Status: AC
  Filled 2019-04-25: qty 4

## 2019-04-25 MED ORDER — PHENYLEPHRINE HCL (PRESSORS) 10 MG/ML IV SOLN
INTRAVENOUS | Status: AC
  Filled 2019-04-25: qty 2

## 2019-04-25 MED ORDER — PROPOFOL 10 MG/ML IV BOLUS
INTRAVENOUS | Status: AC
  Filled 2019-04-25: qty 40

## 2019-04-25 MED ORDER — LOSARTAN POTASSIUM 50 MG PO TABS
100.0000 mg | ORAL_TABLET | Freq: Every day | ORAL | Status: DC
  Administered 2019-04-26: 100 mg via ORAL
  Filled 2019-04-25: qty 2

## 2019-04-25 MED ORDER — SODIUM CHLORIDE 0.9 % IR SOLN
Status: DC | PRN
  Administered 2019-04-25: 4000 mL

## 2019-04-25 MED ORDER — ONDANSETRON HCL 4 MG/2ML IJ SOLN: 4.0000 mg | Freq: Four times a day (QID) | INTRAMUSCULAR | Status: DC | PRN

## 2019-04-25 MED ORDER — NITROGLYCERIN 0.4 MG SL SUBL: 0.4000 mg | SUBLINGUAL_TABLET | SUBLINGUAL | Status: DC | PRN

## 2019-04-25 MED ORDER — LORAZEPAM 2 MG/ML IJ SOLN: 1.0000 mg | Freq: Four times a day (QID) | INTRAMUSCULAR | Status: DC | PRN

## 2019-04-25 MED ORDER — PROPOFOL 10 MG/ML IV BOLUS
INTRAVENOUS | Status: AC
  Filled 2019-04-25: qty 20

## 2019-04-25 MED ORDER — DOCUSATE SODIUM 100 MG PO CAPS
100.0000 mg | ORAL_CAPSULE | Freq: Two times a day (BID) | ORAL | Status: DC
  Administered 2019-04-25 – 2019-04-26 (×3): 100 mg via ORAL
  Filled 2019-04-25 (×3): qty 1

## 2019-04-25 MED ORDER — SODIUM CHLORIDE 0.9 % IV SOLN
INTRAVENOUS | Status: DC | PRN
  Administered 2019-04-25: 30 ug/min via INTRAVENOUS
  Administered 2019-04-25: 50 ug/min via INTRAVENOUS
  Administered 2019-04-25: 40 ug/min via INTRAVENOUS
  Administered 2019-04-25: 25 ug/min via INTRAVENOUS

## 2019-04-25 MED ORDER — ASPIRIN 81 MG PO CHEW
81.0000 mg | CHEWABLE_TABLET | Freq: Two times a day (BID) | ORAL | Status: DC
  Administered 2019-04-25 – 2019-04-26 (×3): 81 mg via ORAL
  Filled 2019-04-25 (×3): qty 1

## 2019-04-25 MED ORDER — INSULIN ASPART 100 UNIT/ML ~~LOC~~ SOLN
SUBCUTANEOUS | Status: AC
  Filled 2019-04-25: qty 1

## 2019-04-25 MED ORDER — SENNA 8.6 MG PO TABS
1.0000 | ORAL_TABLET | Freq: Two times a day (BID) | ORAL | Status: DC
  Administered 2019-04-25 – 2019-04-26 (×4): 8.6 mg via ORAL
  Filled 2019-04-25 (×4): qty 1

## 2019-04-25 MED ORDER — ACETAMINOPHEN 325 MG PO TABS
325.0000 mg | ORAL_TABLET | Freq: Four times a day (QID) | ORAL | Status: DC | PRN
  Administered 2019-04-26: 650 mg via ORAL
  Filled 2019-04-25: qty 2

## 2019-04-25 MED ORDER — LACTATED RINGERS IV SOLN
INTRAVENOUS | Status: DC
  Administered 2019-04-25 (×2): via INTRAVENOUS

## 2019-04-25 MED ORDER — ALBUTEROL SULFATE HFA 108 (90 BASE) MCG/ACT IN AERS: 2.0000 | INHALATION_SPRAY | Freq: Four times a day (QID) | RESPIRATORY_TRACT | Status: DC | PRN

## 2019-04-25 MED ORDER — METHOCARBAMOL 500 MG IVPB - SIMPLE MED
500.0000 mg | Freq: Four times a day (QID) | INTRAVENOUS | Status: DC | PRN
  Filled 2019-04-25: qty 50

## 2019-04-25 MED ORDER — INSULIN ASPART 100 UNIT/ML ~~LOC~~ SOLN
0.0000 [IU] | Freq: Every day | SUBCUTANEOUS | Status: DC
  Administered 2019-04-25: 5 [IU] via SUBCUTANEOUS
  Administered 2019-04-26: 22:00:00 2 [IU] via SUBCUTANEOUS

## 2019-04-25 MED ORDER — MIDAZOLAM HCL 2 MG/2ML IJ SOLN
INTRAMUSCULAR | Status: AC
  Filled 2019-04-25: qty 2

## 2019-04-25 MED ORDER — MENTHOL 3 MG MT LOZG: 1.0000 | LOZENGE | OROMUCOSAL | Status: DC | PRN

## 2019-04-25 MED ORDER — HYDRALAZINE HCL 50 MG PO TABS
50.0000 mg | ORAL_TABLET | Freq: Three times a day (TID) | ORAL | Status: DC
  Administered 2019-04-25 – 2019-04-27 (×6): 50 mg via ORAL
  Filled 2019-04-25 (×6): qty 1

## 2019-04-25 MED ORDER — ISOSORBIDE MONONITRATE ER 30 MG PO TB24
15.0000 mg | ORAL_TABLET | Freq: Every day | ORAL | Status: DC
  Administered 2019-04-26: 15 mg via ORAL
  Filled 2019-04-25: qty 1

## 2019-04-25 MED ORDER — DEXAMETHASONE SODIUM PHOSPHATE 10 MG/ML IJ SOLN
10.0000 mg | Freq: Once | INTRAMUSCULAR | Status: AC
  Administered 2019-04-26: 10 mg via INTRAVENOUS
  Filled 2019-04-25: qty 1

## 2019-04-25 MED ORDER — OXYCODONE HCL 5 MG PO TABS
5.0000 mg | ORAL_TABLET | ORAL | Status: DC | PRN
  Filled 2019-04-25 (×5): qty 2

## 2019-04-25 MED ORDER — POLYETHYLENE GLYCOL 3350 17 G PO PACK: 17.0000 g | PACK | Freq: Every day | ORAL | Status: DC | PRN

## 2019-04-25 MED ORDER — ISOPROPYL ALCOHOL 70 % SOLN
Status: AC
  Filled 2019-04-25: qty 480

## 2019-04-25 MED ORDER — ATORVASTATIN CALCIUM 40 MG PO TABS
40.0000 mg | ORAL_TABLET | Freq: Every day | ORAL | Status: DC
  Administered 2019-04-25 – 2019-04-26 (×2): 40 mg via ORAL
  Filled 2019-04-25: qty 2
  Filled 2019-04-25: qty 1

## 2019-04-25 MED ORDER — PHENOL 1.4 % MT LIQD: 1.0000 | OROMUCOSAL | Status: DC | PRN

## 2019-04-25 MED ORDER — METOCLOPRAMIDE HCL 5 MG/ML IJ SOLN: 5.0000 mg | Freq: Three times a day (TID) | INTRAMUSCULAR | Status: DC | PRN

## 2019-04-25 MED ORDER — TRANEXAMIC ACID-NACL 1000-0.7 MG/100ML-% IV SOLN
1000.0000 mg | INTRAVENOUS | Status: AC
  Administered 2019-04-25: 1000 mg via INTRAVENOUS
  Filled 2019-04-25: qty 100

## 2019-04-25 MED ORDER — ALUM & MAG HYDROXIDE-SIMETH 200-200-20 MG/5ML PO SUSP: 30.0000 mL | ORAL | Status: DC | PRN

## 2019-04-25 MED ORDER — CHLORHEXIDINE GLUCONATE 4 % EX LIQD: 60.0000 mL | Freq: Once | CUTANEOUS | Status: DC

## 2019-04-25 MED ORDER — FOLIC ACID 1 MG PO TABS
1.0000 mg | ORAL_TABLET | Freq: Every day | ORAL | Status: DC
  Administered 2019-04-25 – 2019-04-26 (×2): 1 mg via ORAL
  Filled 2019-04-25 (×2): qty 1

## 2019-04-25 MED ORDER — LORAZEPAM 1 MG PO TABS
1.0000 mg | ORAL_TABLET | Freq: Four times a day (QID) | ORAL | Status: DC | PRN
  Administered 2019-04-26: 1 mg via ORAL

## 2019-04-25 MED ORDER — OXYCODONE HCL 5 MG PO TABS
10.0000 mg | ORAL_TABLET | ORAL | Status: DC | PRN
  Administered 2019-04-25 – 2019-04-27 (×6): 10 mg via ORAL
  Filled 2019-04-25: qty 2

## 2019-04-25 MED ORDER — ESMOLOL HCL 100 MG/10ML IV SOLN
INTRAVENOUS | Status: DC | PRN
  Administered 2019-04-25: 20 mg via INTRAVENOUS
  Administered 2019-04-25 (×2): 30 mg via INTRAVENOUS

## 2019-04-25 MED ORDER — FENTANYL CITRATE (PF) 100 MCG/2ML IJ SOLN: 25.0000 ug | INTRAMUSCULAR | Status: DC | PRN

## 2019-04-25 MED ORDER — PROPOFOL 10 MG/ML IV BOLUS
INTRAVENOUS | Status: AC
  Filled 2019-04-25: qty 120

## 2019-04-25 MED ORDER — FENTANYL CITRATE (PF) 100 MCG/2ML IJ SOLN
INTRAMUSCULAR | Status: DC | PRN
  Administered 2019-04-25 (×5): 50 ug via INTRAVENOUS

## 2019-04-25 MED ORDER — HYDROMORPHONE HCL 1 MG/ML IJ SOLN: 0.5000 mg | INTRAMUSCULAR | Status: DC | PRN

## 2019-04-25 MED ORDER — METOPROLOL TARTRATE 50 MG PO TABS
50.0000 mg | ORAL_TABLET | Freq: Once | ORAL | Status: AC
  Administered 2019-04-25: 50 mg via ORAL
  Filled 2019-04-25: qty 1

## 2019-04-25 MED ORDER — VITAMIN B-1 100 MG PO TABS
100.0000 mg | ORAL_TABLET | Freq: Every day | ORAL | Status: DC
  Administered 2019-04-25 – 2019-04-26 (×2): 100 mg via ORAL
  Filled 2019-04-25 (×2): qty 1

## 2019-04-25 MED ORDER — MIDAZOLAM HCL 5 MG/5ML IJ SOLN
INTRAMUSCULAR | Status: DC | PRN
  Administered 2019-04-25: 2 mg via INTRAVENOUS

## 2019-04-25 MED ORDER — ONDANSETRON HCL 4 MG/2ML IJ SOLN: 4.0000 mg | Freq: Once | INTRAMUSCULAR | Status: DC | PRN

## 2019-04-25 MED ORDER — SODIUM CHLORIDE 0.9 % IV SOLN
INTRAVENOUS | Status: DC
  Administered 2019-04-25 – 2019-04-27 (×2): via INTRAVENOUS

## 2019-04-25 MED ORDER — DEXAMETHASONE SODIUM PHOSPHATE 10 MG/ML IJ SOLN
INTRAMUSCULAR | Status: DC | PRN
  Administered 2019-04-25: 10 mg via INTRAVENOUS

## 2019-04-25 MED ORDER — ALBUTEROL SULFATE HFA 108 (90 BASE) MCG/ACT IN AERS
INHALATION_SPRAY | RESPIRATORY_TRACT | Status: DC | PRN
  Administered 2019-04-25: 2 via RESPIRATORY_TRACT

## 2019-04-25 MED ORDER — PHENYLEPHRINE 40 MCG/ML (10ML) SYRINGE FOR IV PUSH (FOR BLOOD PRESSURE SUPPORT)
PREFILLED_SYRINGE | INTRAVENOUS | Status: DC | PRN
  Administered 2019-04-25: 80 ug via INTRAVENOUS
  Administered 2019-04-25: 120 ug via INTRAVENOUS

## 2019-04-25 MED ORDER — SODIUM CHLORIDE 0.9 % IV SOLN: INTRAVENOUS | Status: DC

## 2019-04-25 MED ORDER — CEFAZOLIN SODIUM-DEXTROSE 2-4 GM/100ML-% IV SOLN
2.0000 g | INTRAVENOUS | Status: AC
  Administered 2019-04-25: 2 g via INTRAVENOUS
  Filled 2019-04-25: qty 100

## 2019-04-25 MED ORDER — ZOLPIDEM TARTRATE 10 MG PO TABS: 10.0000 mg | ORAL_TABLET | Freq: Every evening | ORAL | Status: DC | PRN

## 2019-04-25 MED ORDER — METFORMIN HCL ER 500 MG PO TB24
1000.0000 mg | ORAL_TABLET | Freq: Every evening | ORAL | Status: DC
  Administered 2019-04-25 – 2019-04-26 (×2): 1000 mg via ORAL
  Filled 2019-04-25 (×2): qty 2

## 2019-04-25 MED ORDER — BUPIVACAINE-EPINEPHRINE (PF) 0.25% -1:200000 IJ SOLN
INTRAMUSCULAR | Status: AC
  Filled 2019-04-25: qty 30

## 2019-04-25 MED ORDER — SUGAMMADEX SODIUM 200 MG/2ML IV SOLN
INTRAVENOUS | Status: DC | PRN
  Administered 2019-04-25: 100 mg via INTRAVENOUS
  Administered 2019-04-25: 200 mg via INTRAVENOUS

## 2019-04-25 MED ORDER — ESMOLOL HCL 100 MG/10ML IV SOLN
INTRAVENOUS | Status: AC
  Filled 2019-04-25: qty 10

## 2019-04-25 MED ORDER — SODIUM CHLORIDE (PF) 0.9 % IJ SOLN
INTRAMUSCULAR | Status: DC | PRN
  Administered 2019-04-25: 30 mL

## 2019-04-25 MED ORDER — METHOCARBAMOL 500 MG PO TABS
500.0000 mg | ORAL_TABLET | Freq: Four times a day (QID) | ORAL | Status: DC | PRN
  Administered 2019-04-25 – 2019-04-27 (×4): 500 mg via ORAL
  Filled 2019-04-25 (×4): qty 1

## 2019-04-25 MED ORDER — 0.9 % SODIUM CHLORIDE (POUR BTL) OPTIME
TOPICAL | Status: DC | PRN
  Administered 2019-04-25: 1000 mL

## 2019-04-25 MED ORDER — SUCCINYLCHOLINE CHLORIDE 200 MG/10ML IV SOSY
PREFILLED_SYRINGE | INTRAVENOUS | Status: AC
  Filled 2019-04-25: qty 10

## 2019-04-25 MED ORDER — EPHEDRINE 5 MG/ML INJ
INTRAVENOUS | Status: AC
  Filled 2019-04-25: qty 10

## 2019-04-25 MED ORDER — AMLODIPINE BESYLATE 5 MG PO TABS
5.0000 mg | ORAL_TABLET | Freq: Every day | ORAL | Status: DC
  Administered 2019-04-26: 5 mg via ORAL
  Filled 2019-04-25: qty 1

## 2019-04-25 MED ORDER — KETOROLAC TROMETHAMINE 30 MG/ML IJ SOLN
INTRAMUSCULAR | Status: DC | PRN
  Administered 2019-04-25: 30 mg

## 2019-04-25 MED ORDER — POVIDONE-IODINE 10 % EX SWAB
2.0000 "application " | Freq: Once | CUTANEOUS | Status: AC
  Administered 2019-04-25: 2 via TOPICAL

## 2019-04-25 MED ORDER — LIDOCAINE 2% (20 MG/ML) 5 ML SYRINGE
INTRAMUSCULAR | Status: DC | PRN
  Administered 2019-04-25: 60 mg via INTRAVENOUS

## 2019-04-25 MED ORDER — ACETAMINOPHEN 10 MG/ML IV SOLN
1000.0000 mg | INTRAVENOUS | Status: AC
  Administered 2019-04-25: 1000 mg via INTRAVENOUS
  Filled 2019-04-25: qty 100

## 2019-04-25 MED ORDER — EPHEDRINE SULFATE-NACL 50-0.9 MG/10ML-% IV SOSY
PREFILLED_SYRINGE | INTRAVENOUS | Status: DC | PRN
  Administered 2019-04-25 (×2): 10 mg via INTRAVENOUS

## 2019-04-25 MED ORDER — PROPOFOL 10 MG/ML IV BOLUS
INTRAVENOUS | Status: DC | PRN
  Administered 2019-04-25: 200 mg via INTRAVENOUS

## 2019-04-25 MED ORDER — DEXAMETHASONE SODIUM PHOSPHATE 10 MG/ML IJ SOLN
INTRAMUSCULAR | Status: AC
  Filled 2019-04-25: qty 2

## 2019-04-25 MED ORDER — CELECOXIB 200 MG PO CAPS
200.0000 mg | ORAL_CAPSULE | Freq: Two times a day (BID) | ORAL | Status: DC
  Administered 2019-04-25 – 2019-04-26 (×4): 200 mg via ORAL
  Filled 2019-04-25 (×3): qty 1

## 2019-04-25 MED ORDER — LORAZEPAM 1 MG PO TABS
0.0000 mg | ORAL_TABLET | Freq: Four times a day (QID) | ORAL | Status: DC
  Filled 2019-04-25 (×2): qty 1

## 2019-04-25 MED ORDER — ROCURONIUM BROMIDE 10 MG/ML (PF) SYRINGE
PREFILLED_SYRINGE | INTRAVENOUS | Status: DC | PRN
  Administered 2019-04-25: 15 mg via INTRAVENOUS
  Administered 2019-04-25: 20 mg via INTRAVENOUS
  Administered 2019-04-25: 50 mg via INTRAVENOUS

## 2019-04-25 MED ORDER — POVIDONE-IODINE 10 % EX SWAB: 2.0000 "application " | Freq: Once | CUTANEOUS | Status: AC

## 2019-04-25 MED ORDER — INSULIN ASPART 100 UNIT/ML ~~LOC~~ SOLN
0.0000 [IU] | Freq: Three times a day (TID) | SUBCUTANEOUS | Status: DC
  Administered 2019-04-25: 3 [IU] via SUBCUTANEOUS
  Administered 2019-04-25: 5 [IU] via SUBCUTANEOUS
  Administered 2019-04-26: 2 [IU] via SUBCUTANEOUS
  Administered 2019-04-26: 5 [IU] via SUBCUTANEOUS
  Administered 2019-04-26: 3 [IU] via SUBCUTANEOUS
  Administered 2019-04-27: 2 [IU] via SUBCUTANEOUS

## 2019-04-25 MED ORDER — CLOPIDOGREL BISULFATE 75 MG PO TABS
75.0000 mg | ORAL_TABLET | Freq: Every day | ORAL | Status: DC
  Administered 2019-04-26: 75 mg via ORAL
  Filled 2019-04-25: qty 1

## 2019-04-25 MED ORDER — METOCLOPRAMIDE HCL 5 MG PO TABS: 5.0000 mg | ORAL_TABLET | Freq: Three times a day (TID) | ORAL | Status: DC | PRN

## 2019-04-25 MED ORDER — SODIUM CHLORIDE (PF) 0.9 % IJ SOLN
INTRAMUSCULAR | Status: AC
  Filled 2019-04-25: qty 50

## 2019-04-25 MED ORDER — KETOROLAC TROMETHAMINE 30 MG/ML IJ SOLN
INTRAMUSCULAR | Status: AC
  Filled 2019-04-25: qty 1

## 2019-04-25 MED ORDER — ONDANSETRON HCL 4 MG PO TABS: 4.0000 mg | ORAL_TABLET | Freq: Four times a day (QID) | ORAL | Status: DC | PRN

## 2019-04-25 MED ORDER — ALBUTEROL SULFATE (2.5 MG/3ML) 0.083% IN NEBU: 2.5000 mg | INHALATION_SOLUTION | Freq: Four times a day (QID) | RESPIRATORY_TRACT | Status: DC | PRN

## 2019-04-25 MED ORDER — FENTANYL CITRATE (PF) 100 MCG/2ML IJ SOLN
INTRAMUSCULAR | Status: AC
  Filled 2019-04-25: qty 2

## 2019-04-25 MED ORDER — BUPIVACAINE-EPINEPHRINE 0.25% -1:200000 IJ SOLN
INTRAMUSCULAR | Status: DC | PRN
  Administered 2019-04-25: 30 mL

## 2019-04-25 MED ORDER — SUCCINYLCHOLINE CHLORIDE 200 MG/10ML IV SOSY
PREFILLED_SYRINGE | INTRAVENOUS | Status: DC | PRN
  Administered 2019-04-25: 100 mg via INTRAVENOUS

## 2019-04-25 MED ORDER — ONDANSETRON HCL 4 MG/2ML IJ SOLN
INTRAMUSCULAR | Status: DC | PRN
  Administered 2019-04-25: 4 mg via INTRAVENOUS

## 2019-04-25 SURGICAL SUPPLY — 56 items
BAG DECANTER FOR FLEXI CONT (MISCELLANEOUS) ×3 IMPLANT
BAG ZIPLOCK 12X15 (MISCELLANEOUS) IMPLANT
BALL HIP CERAMIC 32MM PLUS 9 ×1 IMPLANT
BLADE SURG SZ10 CARB STEEL (BLADE) IMPLANT
CHLORAPREP W/TINT 26 (MISCELLANEOUS) ×3 IMPLANT
COVER PERINEAL POST (MISCELLANEOUS) ×3 IMPLANT
COVER SURGICAL LIGHT HANDLE (MISCELLANEOUS) ×3 IMPLANT
COVER WAND RF STERILE (DRAPES) ×3 IMPLANT
DECANTER SPIKE VIAL GLASS SM (MISCELLANEOUS) ×3 IMPLANT
DERMABOND ADVANCED (GAUZE/BANDAGES/DRESSINGS) ×4
DERMABOND ADVANCED .7 DNX12 (GAUZE/BANDAGES/DRESSINGS) ×2 IMPLANT
DRAPE IMP U-DRAPE 54X76 (DRAPES) ×3 IMPLANT
DRAPE SHEET LG 3/4 BI-LAMINATE (DRAPES) ×9 IMPLANT
DRAPE STERI IOBAN 125X83 (DRAPES) ×3 IMPLANT
DRAPE U-SHAPE 47X51 STRL (DRAPES) ×6 IMPLANT
DRSG AQUACEL AG ADV 3.5X10 (GAUZE/BANDAGES/DRESSINGS) ×3 IMPLANT
ELECT PENCIL ROCKER SW 15FT (MISCELLANEOUS) ×3 IMPLANT
ELECT REM PT RETURN 15FT ADLT (MISCELLANEOUS) ×3 IMPLANT
GAUZE SPONGE 4X4 12PLY STRL (GAUZE/BANDAGES/DRESSINGS) ×3 IMPLANT
GLOVE BIO SURGEON STRL SZ8.5 (GLOVE) ×6 IMPLANT
GLOVE BIOGEL PI IND STRL 8.5 (GLOVE) ×1 IMPLANT
GLOVE BIOGEL PI INDICATOR 8.5 (GLOVE) ×2
GOWN SPEC L3 XXLG W/TWL (GOWN DISPOSABLE) ×3 IMPLANT
HANDPIECE INTERPULSE COAX TIP (DISPOSABLE) ×2
HIP BALL CERAMIC 32MM PLUS 9 ×3 IMPLANT
HOLDER FOLEY CATH W/STRAP (MISCELLANEOUS) ×3 IMPLANT
HOOD PEEL AWAY FLYTE STAYCOOL (MISCELLANEOUS) ×12 IMPLANT
JET LAVAGE IRRISEPT WOUND (IRRIGATION / IRRIGATOR) ×3
KIT TURNOVER KIT A (KITS) IMPLANT
LAVAGE JET IRRISEPT WOUND (IRRIGATION / IRRIGATOR) ×1 IMPLANT
LINER PINN ACET NEUT 32X52 ×3 IMPLANT
MANIFOLD NEPTUNE II (INSTRUMENTS) ×3 IMPLANT
MARKER SKIN DUAL TIP RULER LAB (MISCELLANEOUS) ×3 IMPLANT
NDL SAFETY ECLIPSE 18X1.5 (NEEDLE) ×1 IMPLANT
NEEDLE HYPO 18GX1.5 SHARP (NEEDLE) ×2
NEEDLE SPNL 18GX3.5 QUINCKE PK (NEEDLE) ×3 IMPLANT
PACK ANTERIOR HIP CUSTOM (KITS) ×3 IMPLANT
PIN SECTOR W/GRIP ACE CUP 52MM (Hips) ×3 IMPLANT
SAW OSC TIP CART 19.5X105X1.3 (SAW) ×3 IMPLANT
SEALER BIPOLAR AQUA 6.0 (INSTRUMENTS) ×3 IMPLANT
SET HNDPC FAN SPRY TIP SCT (DISPOSABLE) ×1 IMPLANT
STEM TRI LOC BPS SZ3 W GRIPTON ×1 IMPLANT
SUT ETHIBOND NAB CT1 #1 30IN (SUTURE) ×6 IMPLANT
SUT MNCRL AB 3-0 PS2 18 (SUTURE) ×3 IMPLANT
SUT MNCRL AB 4-0 PS2 18 (SUTURE) ×3 IMPLANT
SUT MON AB 2-0 CT1 36 (SUTURE) ×6 IMPLANT
SUT STRATAFIX PDO 1 14 VIOLET (SUTURE) ×2
SUT STRATFX PDO 1 14 VIOLET (SUTURE) ×1
SUT VIC AB 2-0 CT1 27 (SUTURE) ×2
SUT VIC AB 2-0 CT1 TAPERPNT 27 (SUTURE) ×1 IMPLANT
SUTURE STRATFX PDO 1 14 VIOLET (SUTURE) ×1 IMPLANT
SYR 10ML LL (SYRINGE) ×3 IMPLANT
SYR 3ML LL SCALE MARK (SYRINGE) ×3 IMPLANT
TRI LOC BPS SZ 3 W GRIPTON ×3 IMPLANT
WATER STERILE IRR 1000ML POUR (IV SOLUTION) ×6 IMPLANT
YANKAUER SUCT BULB TIP 10FT TU (MISCELLANEOUS) ×3 IMPLANT

## 2019-04-25 NOTE — Op Note (Signed)
OPERATIVE REPORT  SURGEON: Rod Can, MD   ASSISTANT: Nehemiah Massed, PA-C.  PREOPERATIVE DIAGNOSIS: Right hip arthritis.   POSTOPERATIVE DIAGNOSIS: Right hip arthritis.   PROCEDURE: Right total hip arthroplasty, anterior approach.   IMPLANTS: DePuy Tri Lock stem, size 3, hi offset. DePuy Pinnacle Cup, size 52 mm. DePuy Altrx liner, size 32 by 52 mm, neutral. DePuy Biolox ceramic head ball, size 32 + 9 mm.  ANESTHESIA:  General  ESTIMATED BLOOD LOSS: 350 mL.    ANTIBIOTICS: 2 g Ancef.  DRAINS: None.  COMPLICATIONS: None.   CONDITION: PACU - hemodynamically stable.   BRIEF CLINICAL NOTE: [name] Foster is a 63 y.o. male with a long-standing history of Right hip arthritis. After failing conservative management, the patient was indicated for total hip arthroplasty. The risks, benefits, and alternatives to the procedure were explained, and the patient elected to proceed.  PROCEDURE IN DETAIL: Surgical site was marked by myself in the pre-op holding area. Once inside the operating room, spinal anesthesia was obtained, and a foley catheter was inserted. The patient was then positioned on the Hana table.  All bony prominences were well padded.  The hip was prepped and draped in the normal sterile surgical fashion.  A time-out was called verifying side and site of surgery. The patient received IV antibiotics within 60 minutes of beginning the procedure.   The direct anterior approach to the hip was performed through the Hueter interval.  Lateral femoral circumflex vessels were treated with the Auqumantys. The anterior capsule was exposed and an inverted T capsulotomy was made. The femoral neck cut was made to the level of the templated cut.  A corkscrew was placed into the head and the head was removed.  The femoral head was found to have eburnated bone. The head was passed to the back table and was measured.   Acetabular exposure was achieved, and the pulvinar and labrum  were excised. Sequential reaming of the acetabulum was then performed up to a size 51 mm reamer. A 52 mm cup was then opened and impacted into place at approximately 40 degrees of abduction and 20 degrees of anteversion. The final polyethylene liner was impacted into place and acetabular osteophytes were removed.    I then gained femoral exposure taking care to protect the abductors and greater trochanter.  This was performed using standard external rotation, extension, and adduction.  The capsule was peeled off the inner aspect of the greater trochanter, taking care to preserve the short external rotators. A cookie cutter was used to enter the femoral canal, and then the femoral canal finder was placed.  Sequential broaching was performed up to a size 3.  Calcar planer was used on the femoral neck remnant.  I placed a hi offset neck and a trial head ball.  The hip was reduced.  Leg lengths and offset were checked fluoroscopically.  The hip was dislocated and trial components were removed.  The final implants were placed, and the hip was reduced.  Fluoroscopy was used to confirm component position and leg lengths.  At 90 degrees of external rotation and full extension, the hip was stable to an anterior directed force.   The wound was copiously irrigated with Irrisept solution and normal saline using pule lavage.  Marcaine solution was injected into the periarticular soft tissue.  The wound was closed in layers using #1 Stratafix for the fascia, 2-0 Vicryl for the subcutaneous fat, 2-0 Monocryl for the deep dermal layer, 3-0 running Monocryl subcuticular stitch,  and Dermabond for the skin.  Once the glue was fully dried, an Aquacell Ag dressing was applied.  The patient was transported to the recovery room in stable condition.  Sponge, needle, and instrument counts were correct at the end of the case x2.  The patient tolerated the procedure well and there were no known complications.  Please note that a  surgical assistant was a medical necessity for this procedure to perform it in a safe and expeditious manner. Assistant was necessary to provide appropriate retraction of vital neurovascular structures, to prevent femoral fracture, and to allow for anatomic placement of the prosthesis.

## 2019-04-25 NOTE — Anesthesia Postprocedure Evaluation (Signed)
Anesthesia Post Note  Patient: [name] Foster  Procedure(s) Performed: TOTAL HIP ARTHROPLASTY ANTERIOR APPROACH (Right Hip)     Patient location during evaluation: PACU Anesthesia Type: General Level of consciousness: awake and alert Pain management: pain level controlled Vital Signs Assessment: post-procedure vital signs reviewed and stable Respiratory status: spontaneous breathing, nonlabored ventilation, respiratory function stable and patient connected to nasal cannula oxygen Cardiovascular status: blood pressure returned to baseline and stable Postop Assessment: no apparent nausea or vomiting Anesthetic complications: no    Last Vitals:  Vitals:   04/25/19 1231 04/25/19 1324  BP: 100/60 128/79  Pulse: 72 75  Resp: 14 16  Temp: 36.7 C 36.4 C  SpO2: 97% 96%    Last Pain:  Vitals:   04/25/19 1324  TempSrc: Oral  PainSc:                  [name]

## 2019-04-25 NOTE — Evaluation (Signed)
Physical Therapy Evaluation Patient Details Name: [name] Foster MRN: [id number] DOB: 1955/11/21 Today's Date: 04/25/2019   History of Present Illness  63 yo male s/p R DA-THA on 04/25/19. PMH includes OA, DM, CAD with stenting, HTN, HLD, L TKR, bilateral shoulder arthroplasty.  Clinical Impression  Pt presents with mild R hip pain, post-operative R hip weakness, increased time and effort to perform mobility tasks, and decreased activity tolerance due to pain. Pt to benefit from acute PT to address deficits. Pt ambulated 25 ft with RW with min guard assist, verbal cuing for form and safety provided throughout. Pt educated on ankle pumps (20/hour) to perform this afternoon/evening to increase circulation, to pt's tolerance and limited by pain. PT to progress mobility as tolerated, and will continue to follow acutely.        Follow Up Recommendations Follow surgeon's recommendation for DC plan and follow-up therapies;Supervision for mobility/OOB    Equipment Recommendations  None recommended by PT    Recommendations for Other Services       Precautions / Restrictions Precautions Precautions: Fall Restrictions Weight Bearing Restrictions: No Other Position/Activity Restrictions: WBAT      Mobility  Bed Mobility Overal bed mobility: Needs Assistance Bed Mobility: Supine to Sit     Supine to sit: Min assist;HOB elevated     General bed mobility comments: Min assist for RLE lifting and translation to EOB, verbal cuing for sequencing.  Transfers Overall transfer level: Needs assistance Equipment used: Rolling walker (2 wheeled) Transfers: Sit to/from Stand Sit to Stand: Min guard;From elevated surface         General transfer comment: min guard for safety, verbal cuing for hand placement when rising.  Ambulation/Gait Ambulation/Gait assistance: Min guard Gait Distance (Feet): 75 Feet Assistive device: Rolling walker (2 wheeled) Gait Pattern/deviations: Step-to  pattern;Step-through pattern;Decreased stride length;Trunk flexed Gait velocity: decr   General Gait Details: min guard for safety. verbal cuing for sequencing, placement in RW, turning with RW.  Stairs            Wheelchair Mobility    Modified Rankin (Stroke Patients Only)       Balance Overall balance assessment: Mild deficits observed, not formally tested                                           Pertinent Vitals/Pain Pain Assessment: 0-10 Pain Score: 2  Pain Location: R hip Pain Descriptors / Indicators: Sore Pain Intervention(s): Limited activity within patient's tolerance;Monitored during session;Premedicated before session;Repositioned;Ice applied    Home Living Family/patient expects to be discharged to:: Private residence Living Arrangements: Spouse/significant other Available Help at Discharge: Family Type of Home: House Home Access: Stairs to enter;Ramped entrance Entrance Stairs-Rails: Right Entrance Stairs-Number of Steps: 5 Home Layout: Able to live on main level with bedroom/bathroom Home Equipment: Walker - 2 wheels;Cane - single point;Bedside commode;Grab bars - tub/shower;Grab bars - toilet      Prior Function Level of Independence: Independent with assistive device(s)         Comments: pt reports using cane for ambulation PTA     Hand Dominance   Dominant Hand: Right    Extremity/Trunk Assessment   Upper Extremity Assessment Upper Extremity Assessment: Overall WFL for tasks assessed    Lower Extremity Assessment Lower Extremity Assessment: Overall WFL for tasks assessed;RLE deficits/detail RLE Deficits / Details: suspected post-surgical hip weakness; able to perform ankle  pumps, quad set, heel slide with active assist RLE Sensation: WNL    Cervical / Trunk Assessment Cervical / Trunk Assessment: Normal  Communication   Communication: No difficulties  Cognition Arousal/Alertness: Awake/alert Behavior During  Therapy: WFL for tasks assessed/performed Overall Cognitive Status: Within Functional Limits for tasks assessed                                        General Comments      Exercises     Assessment/Plan    PT Assessment Patient needs continued PT services  PT Problem List Decreased strength;Decreased mobility;Decreased range of motion;Decreased activity tolerance;Decreased balance;Decreased knowledge of use of DME;Pain       PT Treatment Interventions DME instruction;Therapeutic activities;Gait training;Therapeutic exercise;Balance training;Stair training;Patient/family education;Functional mobility training    PT Goals (Current goals can be found in the Care Plan section)  Acute Rehab PT Goals PT Goal Formulation: With patient Time For Goal Achievement: 05/02/19 Potential to Achieve Goals: Good    Frequency 7X/week   Barriers to discharge        Co-evaluation               AM-PAC PT "6 Clicks" Mobility  Outcome Measure Help needed turning from your back to your side while in a flat bed without using bedrails?: A Little Help needed moving from lying on your back to sitting on the side of a flat bed without using bedrails?: A Little Help needed moving to and from a bed to a chair (including a wheelchair)?: A Little Help needed standing up from a chair using your arms (e.g., wheelchair or bedside chair)?: A Little Help needed to walk in hospital room?: A Little Help needed climbing 3-5 steps with a railing? : A Little 6 Click Score: 18    End of Session Equipment Utilized During Treatment: Gait belt Activity Tolerance: Patient tolerated treatment well Patient left: in chair;with chair alarm set;with SCD's reapplied;with call bell/phone within reach Nurse Communication: Mobility status PT Visit Diagnosis: Other abnormalities of gait and mobility (R26.89);Difficulty in walking, not elsewhere classified (R26.2)    Time: 5852-7782 PT Time  Calculation (min) (ACUTE ONLY): 16 min   Charges:   PT Evaluation $PT Eval Low Complexity: 1 Low         Julien Girt, PT Acute Rehabilitation Services Pager [phone]  Office [phone]  Selina Tapper D Elonda Husky 04/25/2019, 5:18 PM

## 2019-04-25 NOTE — Transfer of Care (Signed)
Immediate Anesthesia Transfer of Care Note  Patient: [name] Foster  Procedure(s) Performed: TOTAL HIP ARTHROPLASTY ANTERIOR APPROACH (Right Hip)  Patient Location: PACU  Anesthesia Type:General  Level of Consciousness: awake, oriented and patient cooperative  Airway & Oxygen Therapy: Patient Spontanous Breathing and Patient connected to face mask oxygen  Post-op Assessment: Report given to RN and Post -op Vital signs reviewed and stable  Post vital signs: Reviewed and stable  Last Vitals:  Vitals Value Taken Time  BP 150/95 04/25/19 1115  Temp    Pulse 82 04/25/19 1118  Resp 10 04/25/19 1118  SpO2 97 % 04/25/19 1118  Vitals shown include unvalidated device data.  Last Pain:  Vitals:   04/25/19 0639  TempSrc:   PainSc: 4       Patients Stated Pain Goal: 4 (58/09/98 3382)  Complications: No apparent anesthesia complications

## 2019-04-25 NOTE — Progress Notes (Signed)
Pt placed on full face mask cpap auto setting with 6l o2 bleed in. Instructed RN to wean o2 as needed. Pt tolerating cpap well.

## 2019-04-25 NOTE — Anesthesia Procedure Notes (Signed)
Procedure Name: Intubation Date/Time: 04/25/2019 8:48 AM Performed by: Silas Sacramento, CRNA Pre-anesthesia Checklist: Patient identified, Emergency Drugs available, Suction available and Patient being monitored Patient Re-evaluated:Patient Re-evaluated prior to induction Oxygen Delivery Method: Circle system utilized Preoxygenation: Pre-oxygenation with 100% oxygen Induction Type: IV induction and Rapid sequence Laryngoscope Size: Mac and 4 Grade View: Grade I Tube type: Oral Tube size: 7.0 mm Number of attempts: 1 Airway Equipment and Method: Stylet and Oral airway Placement Confirmation: ETT inserted through vocal cords under direct vision,  positive ETCO2 and breath sounds checked- equal and bilateral Secured at: 24 cm Tube secured with: Tape Dental Injury: Teeth and Oropharynx as per pre-operative assessment

## 2019-04-25 NOTE — Progress Notes (Signed)
Patient declines the use of nocturnal CPAP tonight. He remains on supplemental O2 and a bedside pulse ox per protocol. CPAP equipment remains at the bedside for use in the event he has further complications and requires it later in the night. Patient agreeable to plan. Order changed to prn per RT protocol. RN aware.

## 2019-04-25 NOTE — Discharge Instructions (Signed)
°Dr. Romelle Reiley °Joint Replacement Specialist °[facility] °[address] °[place], [place] [address] °[phone] ° ° °TOTAL HIP REPLACEMENT POSTOPERATIVE DIRECTIONS ° ° ° °Hip Rehabilitation, Guidelines Following Surgery  ° °WEIGHT BEARING °Weight bearing as tolerated with assist device (walker, cane, etc) as directed, use it as long as suggested by your surgeon or therapist, typically at least 4-6 weeks. ° °The results of a hip operation are greatly improved after range of motion and muscle strengthening exercises. Follow all safety measures which are given to protect your hip. If any of these exercises cause increased pain or swelling in your joint, decrease the amount until you are comfortable again. Then slowly increase the exercises. Call your caregiver if you have problems or questions.  ° °HOME CARE INSTRUCTIONS  °Most of the following instructions are designed to prevent the dislocation of your new hip.  °Remove items at home which could result in a fall. This includes throw rugs or furniture in walking pathways.  °Continue medications as instructed at time of discharge. °· You may have some home medications which will be placed on hold until you complete the course of blood thinner medication. °· You may start showering once you are discharged home. Do not remove your dressing. °Do not put on socks or shoes without following the instructions of your caregivers.   °Sit on chairs with arms. Use the chair arms to help push yourself up when arising.  °Arrange for the use of a toilet seat elevator so you are not sitting low.  °· Walk with walker as instructed.  °You may resume a sexual relationship in one month or when given the OK by your caregiver.  °Use walker as long as suggested by your caregivers.  °You may put full weight on your legs and walk as much as is comfortable. °Avoid periods of inactivity such as sitting longer than an hour when not asleep. This helps prevent  blood clots.  °You may return to work once you are cleared by your surgeon.  °Do not drive a car for 6 weeks or until released by your surgeon.  °Do not drive while taking narcotics.  °Wear elastic stockings for two weeks following surgery during the day but you may remove then at night.  °Make sure you keep all of your appointments after your operation with all of your doctors and caregivers. You should call the office at the above phone number and make an appointment for approximately two weeks after the date of your surgery. °Please pick up a stool softener and laxative for home use as long as you are requiring pain medications. °· ICE to the affected hip every three hours for 30 minutes at a time and then as needed for pain and swelling. Continue to use ice on the hip for pain and swelling from surgery. You may notice swelling that will progress down to the foot and ankle.  This is normal after surgery.  Elevate the leg when you are not up walking on it.   °It is important for you to complete the blood thinner medication as prescribed by your doctor. °· Continue to use the breathing machine which will help keep your temperature down.  It is common for your temperature to cycle up and down following surgery, especially at night when you are not up moving around and exerting yourself.  The breathing machine keeps your lungs expanded and your temperature down. ° °RANGE OF MOTION AND STRENGTHENING EXERCISES  °These exercises are   designed to help you keep full movement of your hip joint. Follow your caregiver's or physical therapist's instructions. Perform all exercises about fifteen times, three times per day or as directed. Exercise both hips, even if you have had only one joint replacement. These exercises can be done on a training (exercise) mat, on the floor, on a table or on a bed. Use whatever works the best and is most comfortable for you. Use music or television while you are exercising so that the exercises  are a pleasant break in your day. This will make your life better with the exercises acting as a break in routine you can look forward to.  °Lying on your back, slowly slide your foot toward your buttocks, raising your knee up off the floor. Then slowly slide your foot back down until your leg is straight again.  °Lying on your back spread your legs as far apart as you can without causing discomfort.  °Lying on your side, raise your upper leg and foot straight up from the floor as far as is comfortable. Slowly lower the leg and repeat.  °Lying on your back, tighten up the muscle in the front of your thigh (quadriceps muscles). You can do this by keeping your leg straight and trying to raise your heel off the floor. This helps strengthen the largest muscle supporting your knee.  °Lying on your back, tighten up the muscles of your buttocks both with the legs straight and with the knee bent at a comfortable angle while keeping your heel on the floor.  ° °SKILLED REHAB INSTRUCTIONS: °If the patient is transferred to a skilled rehab facility following release from the hospital, a list of the current medications will be sent to the facility for the patient to continue.  When discharged from the skilled rehab facility, please have the facility set up the patient's Home Health Physical Therapy prior to being released. Also, the skilled facility will be responsible for providing the patient with their medications at time of release from the facility to include their pain medication and their blood thinner medication. If the patient is still at the rehab facility at time of the two week follow up appointment, the skilled rehab facility will also need to assist the patient in arranging follow up appointment in our office and any transportation needs. ° °MAKE SURE YOU:  °Understand these instructions.  °Will watch your condition.  °Will get help right away if you are not doing well or get worse. ° °Pick up stool softner and  laxative for home use following surgery while on pain medications. °Do not remove your dressing. °The dressing is waterproof--it is OK to take showers. °Continue to use ice for pain and swelling after surgery. °Do not use any lotions or creams on the incision until instructed by your surgeon. °Total Hip Protocol. ° ° °

## 2019-04-25 NOTE — Interval H&P Note (Signed)
History and Physical Interval Note:  04/25/2019 8:32 AM  [name] Foster  has presented today for surgery, with the diagnosis of Degenerative joint disease right hip.  The various methods of treatment have been discussed with the patient and family. After consideration of risks, benefits and other options for treatment, the patient has consented to  Procedure(s): TOTAL HIP ARTHROPLASTY ANTERIOR APPROACH (Right) as a surgical intervention.  The patient's history has been reviewed, patient examined, no change in status, stable for surgery.  I have reviewed the patient's chart and labs.  Questions were answered to the patient's satisfaction.     Hilton Cork Kyzer Blowe

## 2019-04-26 LAB — GLUCOSE, CAPILLARY
Glucose-Capillary: 172 mg/dL — ABNORMAL HIGH (ref 70–99)
Glucose-Capillary: 207 mg/dL — ABNORMAL HIGH (ref 70–99)
Glucose-Capillary: 269 mg/dL — ABNORMAL HIGH (ref 70–99)
Glucose-Capillary: 239 mg/dL — ABNORMAL HIGH (ref 70–99)

## 2019-04-26 LAB — BASIC METABOLIC PANEL
Anion gap: 12 (ref 5–15)
Anion gap: 13 (ref 5–15)
BUN: 23 mg/dL (ref 8–23)
BUN: 26 mg/dL — ABNORMAL HIGH (ref 8–23)
CO2: 23 mmol/L (ref 22–32)
CO2: 23 mmol/L (ref 22–32)
Calcium: 8.2 mg/dL — ABNORMAL LOW (ref 8.9–10.3)
Calcium: 8.4 mg/dL — ABNORMAL LOW (ref 8.9–10.3)
Chloride: 101 mmol/L (ref 98–111)
Chloride: 97 mmol/L — ABNORMAL LOW (ref 98–111)
Creatinine, Ser: 1.34 mg/dL — ABNORMAL HIGH (ref 0.61–1.24)
Creatinine, Ser: 1.44 mg/dL — ABNORMAL HIGH (ref 0.61–1.24)
GFR calc Af Amer: >60 mL/min (ref >60)
GFR calc Af Amer: 59 mL/min — ABNORMAL LOW (ref >60)
GFR calc non Af Amer: 56 mL/min — ABNORMAL LOW (ref >60)
GFR calc non Af Amer: 51 mL/min — ABNORMAL LOW (ref >60)
Glucose, Bld: 205 mg/dL — ABNORMAL HIGH (ref 70–99)
Glucose, Bld: 306 mg/dL — ABNORMAL HIGH (ref 70–99)
Potassium: 5 mmol/L (ref 3.5–5.1)
Potassium: 5 mmol/L (ref 3.5–5.1)
Sodium: 136 mmol/L (ref 135–145)
Sodium: 133 mmol/L — ABNORMAL LOW (ref 135–145)

## 2019-04-26 LAB — CBC
HCT: 30.6 % — ABNORMAL LOW (ref 39.0–52.0)
Hemoglobin: 9.5 g/dL — ABNORMAL LOW (ref 13.0–17.0)
MCH: 29.6 pg (ref 26.0–34.0)
MCHC: 31 g/dL (ref 30.0–36.0)
MCV: 95.3 fL (ref 80.0–100.0)
Platelets: 129 10*3/uL — ABNORMAL LOW (ref 150–400)
RBC: 3.21 MIL/uL — ABNORMAL LOW (ref 4.22–5.81)
RDW: 13.5 % (ref 11.5–15.5)
WBC: 7.2 10*3/uL (ref 4.0–10.5)
nRBC: 0 % (ref 0.0–0.2)

## 2019-04-26 MED ORDER — SENNA 8.6 MG PO TABS: 1.0000 | ORAL_TABLET | Freq: Two times a day (BID) | ORAL | 0 refills | Status: DC

## 2019-04-26 MED ORDER — OXYCODONE HCL 5 MG PO TABS: 5.0000 mg | ORAL_TABLET | Freq: Four times a day (QID) | ORAL | 0 refills | Status: DC | PRN

## 2019-04-26 MED ORDER — DOCUSATE SODIUM 100 MG PO CAPS: 100.0000 mg | ORAL_CAPSULE | Freq: Two times a day (BID) | ORAL | 1 refills | Status: DC

## 2019-04-26 MED ORDER — ONDANSETRON HCL 4 MG PO TABS: 4.0000 mg | ORAL_TABLET | Freq: Four times a day (QID) | ORAL | 0 refills | Status: DC | PRN

## 2019-04-26 MED ORDER — ASPIRIN 81 MG PO CHEW: 81.0000 mg | CHEWABLE_TABLET | Freq: Two times a day (BID) | ORAL | 1 refills | Status: DC

## 2019-04-26 NOTE — Progress Notes (Signed)
Physical Therapy Treatment Patient Details Name: [name] Foster MRN: [id number] DOB: 1956/01/19 Today's Date: 04/26/2019    History of Present Illness R DA-THA   PMH includes OA, DM, CAD with stenting, HTN, HLD, L TKR, bilateral shoulder arthroplasty    PT Comments    Pt has met PT goals and is ready to DC home from PT standpoint. He ambulated 400' with RW and demonstrates good understanding of HEP.   Follow Up Recommendations  Follow surgeon's recommendation for DC plan and follow-up therapies;Supervision for mobility/OOB     Equipment Recommendations  None recommended by PT    Recommendations for Other Services       Precautions / Restrictions Precautions Precautions: Fall Restrictions Weight Bearing Restrictions: No Other Position/Activity Restrictions: WBAT    Mobility  Bed Mobility               General bed mobility comments: up in bathroom  Transfers Overall transfer level: Needs assistance Equipment used: Rolling walker (2 wheeled) Transfers: Sit to/from Stand Sit to Stand: Modified independent (Device/Increase time)         General transfer comment: used armrests  Ambulation/Gait Ambulation/Gait assistance: Modified independent (Device/Increase time) Gait Distance (Feet): 400 Feet Assistive device: Rolling walker (2 wheeled) Gait Pattern/deviations: Step-through pattern Gait velocity: WFL   General Gait Details: steady, no loss of balance   Stairs Stairs: (pt declined stair training, stated he has ramp to enter and won't need to do stairs inside)           Wheelchair Mobility    Modified Rankin (Stroke Patients Only)       Balance Overall balance assessment: Modified Independent                                          Cognition Arousal/Alertness: Awake/alert Behavior During Therapy: WFL for tasks assessed/performed Overall Cognitive Status: Within Functional Limits for tasks assessed                                        Exercises Total Joint Exercises Ankle Circles/Pumps: AROM;Both;10 reps;Supine Quad Sets: AROM;Right;5 reps;Supine Short Arc Quad: AROM;Right;10 reps;Supine Heel Slides: AAROM;Right;10 reps;Supine Hip ABduction/ADduction: AROM;Right;10 reps;Supine;Standing(x 10 supine and x 10 standing) Long Arc Quad: AROM;Right;10 reps;Seated Knee Flexion: AROM;Right;10 reps;Standing Marching in Standing: AROM;Right;10 reps;Standing Standing Hip Extension: AROM;Right;10 reps;Standing    General Comments        Pertinent Vitals/Pain Pain Score: 2  Pain Location: R hip Pain Descriptors / Indicators: Sore Pain Intervention(s): Limited activity within patient's tolerance;Monitored during session;Ice applied;Premedicated before session    Home Living                      Prior Function            PT Goals (current goals can now be found in the care plan section) Acute Rehab PT Goals Patient Stated Goal: [place] PT Goal Formulation: With patient Time For Goal Achievement: 05/02/19 Potential to Achieve Goals: Good Progress towards PT goals: Goals met/education completed, patient discharged from PT    Frequency    7X/week      PT Plan Current plan remains appropriate    Co-evaluation              AM-PAC PT "  6 Clicks" Mobility   Outcome Measure  Help needed turning from your back to your side while in a flat bed without using bedrails?: None Help needed moving from lying on your back to sitting on the side of a flat bed without using bedrails?: None Help needed moving to and from a bed to a chair (including a wheelchair)?: None Help needed standing up from a chair using your arms (e.g., wheelchair or bedside chair)?: None Help needed to walk in hospital room?: None Help needed climbing 3-5 steps with a railing? : None 6 Click Score: 24    End of Session Equipment Utilized During Treatment: Gait belt Activity Tolerance:  Patient tolerated treatment well Patient left: in chair;with call bell/phone within reach Nurse Communication: Mobility status PT Visit Diagnosis: Other abnormalities of gait and mobility (R26.89);Difficulty in walking, not elsewhere classified (R26.2)     Time: 8119-1478 PT Time Calculation (min) (ACUTE ONLY): 21 min  Charges:  $Gait Training: 8-22 mins                     Blondell Reveal Kistler PT 04/26/2019  Acute Rehabilitation Services Pager [phone] Office [phone]

## 2019-04-26 NOTE — Progress Notes (Signed)
    Subjective:  Patient reports pain as mild to moderate.  Denies N/V/CP/SOB. No c/o. Progressing rapidly with PT.  Objective:   VITALS:   Vitals:   04/25/19 2045 04/25/19 2119 04/26/19 0509 04/26/19 0800  BP:  121/68 125/69   Pulse: 80 79 67 82  Resp: 16 17 18    Temp:  98.8 F (37.1 C) 98.3 F (36.8 C)   TempSrc:  Oral Oral   SpO2:  99% 100%   Weight:      Height:        NAD ABD soft Sensation intact distally Intact pulses distally Dorsiflexion/Plantar flexion intact Incision: dressing C/D/I Compartment soft   Lab Results  Component Value Date   WBC 7.2 04/26/2019   HGB 9.5 (L) 04/26/2019   HCT 30.6 (L) 04/26/2019   MCV 95.3 04/26/2019   PLT 129 (L) 04/26/2019   BMET    Component Value Date/Time   NA 136 04/26/2019 0243   NA 139 04/11/2019 1508   K 5.0 04/26/2019 0243   CL 101 04/26/2019 0243   CO2 23 04/26/2019 0243   GLUCOSE 205 (H) 04/26/2019 0243   BUN 23 04/26/2019 0243   BUN 25 04/11/2019 1508   CREATININE 1.34 (H) 04/26/2019 0243   CALCIUM 8.2 (L) 04/26/2019 0243   GFRNONAA 56 (L) 04/26/2019 0243   GFRAA >60 04/26/2019 0243     Assessment/Plan: 1 Day Post-Op   Principal Problem:   Osteoarthritis of right hip   WBAT with walker DVT ppx: Aspirin + Plavix, SCDs, TEDS PO pain control PT/OT Mild cr elevation: likely due to surgical volume loss, encourage PO fluids, recheck at 1300  Dispo: D/C home with HEP if PM BMP improves, progressing with PT quicker than anticipated   Bertram Savin 04/26/2019, 9:29 AM   Rod Can, MD Cell: [phone] [facility] now [facility]  [place] [address], [address], [place],  [address] Phone: [phone] [email or web address] Facebook  [employer]

## 2019-04-26 NOTE — TOC Transition Note (Signed)
Transition of Care [facility]) - CM/SW Discharge Note   Patient Details  Name: [name] Foster MRN: [id number] Date of Birth: 02-Apr-1956  Transition of [facility]) CM/SW Contact:  Leeroy Cha, RN Phone Number: 04/26/2019, 9:53 AM   Clinical Narrative:    081320/0953/PT DCD TO HOME WITH EQUIPMENT AND HEP FOR PT.         Patient Goals and CMS Choice        Discharge Placement                       Discharge Plan and Services                                     Social Determinants of Health (SDOH) Interventions     Readmission Risk Interventions No flowsheet data found.

## 2019-04-26 NOTE — Discharge Summary (Signed)
Physician Discharge Summary  Patient ID: [name] Foster MRN: [id number] DOB/AGE: 01-23-1956 63 y.o.  Admit date: 04/25/2019 Discharge date: 04/27/2019  Admission Diagnoses:  Osteoarthritis of right hip  Discharge Diagnoses:  Principal Problem:   Osteoarthritis of right hip   Past Medical History:  Diagnosis Date  . CAD (coronary artery disease)    a. 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 DES to left PDA 09/20/17 // c. 10/19: LCx and LPDA stents ok; s/p DES to pLAD and POBA to D1  . Diabetes mellitus without complication ([place])    type 2   . Heart attack ([place])    " a blood clot is actually what caused my heart attack in 2018 " , he states he is unsure where the blood clot originated from   . Hyperlipidemia   . Hypertension   . Insomnia   . Pulmonary hypertension ([place])    a. TTE 03/25/17; EF 60-65%, nl WM, LV diastolic fxn nl, trivial AI, RV cavity size, wall thickness, and sys fxn nl, trivial TR, PASP 39 mmHg, trivial pericardial effusion  . Sleep apnea    could not use a cpap  . Wears dentures    full top-partial bottom  . Wears glasses     Surgeries: Procedure(s): TOTAL HIP ARTHROPLASTY ANTERIOR APPROACH on 04/25/2019   Consultants (if any):   Discharged Condition: Improved  Hospital Course: [name] Foster is an 63 y.o. male who was admitted 04/25/2019 with a diagnosis of Osteoarthritis of right hip and went to the operating room on 04/25/2019 and underwent the above named procedures.    He was given perioperative antibiotics:  Anti-infectives (From admission, onward)   Start     Dose/Rate Route Frequency Ordered Stop   04/25/19 1500  ceFAZolin (ANCEF) IVPB 2g/100 mL premix     2 g 200 mL/hr over 30 Minutes Intravenous Every 6 hours 04/25/19 1238 04/25/19 2125   04/25/19 0615  ceFAZolin (ANCEF) IVPB 2g/100 mL premix     2 g 200 mL/hr over 30  Minutes Intravenous On call to O.R. 04/25/19 6283 04/25/19 0850    .  He was given sequential compression devices, early ambulation, and ASA + Plavix for DVT prophylaxis.  On POD#1, his Cr elevated and it then resolved with fluids.  He benefited maximally from the hospital stay and there were no complications.    Recent vital signs:  Vitals:   04/26/19 2138 04/27/19 0514  BP: 133/77 139/82  Pulse: 89 78  Resp: 18 18  Temp: 98.1 F (36.7 C) 98.1 F (36.7 C)  SpO2: 98% 98%    Recent laboratory studies:  Lab Results  Component Value Date   HGB 9.3 (L) 04/27/2019   HGB 9.5 (L) 04/26/2019   HGB 11.7 (L) 04/19/2019   Lab Results  Component Value Date   WBC 8.1 04/27/2019   PLT 127 (L) 04/27/2019   Lab Results  Component Value Date   INR 0.9 04/19/2019   Lab Results  Component Value Date   NA 136 04/27/2019   K 4.3 04/27/2019   CL 105 04/27/2019   CO2 24 04/27/2019   BUN 25 (H) 04/27/2019   CREATININE 1.01 04/27/2019   GLUCOSE 171 (H) 04/27/2019    Discharge Medications:   Allergies as of 04/27/2019   No Known Allergies     Medication List  STOP taking these medications   [facility] Aspirin Low Strength 81 MG EC tablet Generic drug: aspirin Replaced by: aspirin 81 MG chewable tablet   HYDROcodone-acetaminophen 10-325 MG tablet Commonly known as: NORCO     TAKE these medications   albuterol 108 (90 Base) MCG/ACT inhaler Commonly known as: VENTOLIN HFA Inhale 2 puffs into the lungs every 6 (six) hours as needed for wheezing or shortness of breath.   amLODipine 5 MG tablet Commonly known as: NORVASC Take 1 tablet (5 mg total) by mouth daily.   aspirin 81 MG chewable tablet Chew 1 tablet (81 mg total) by mouth 2 (two) times daily. Replaces: [facility] Aspirin Low Strength 81 MG EC tablet   atorvastatin 40 MG tablet Commonly known as: LIPITOR Take 1 tablet (40 mg total) by mouth daily at 6 PM.   clopidogrel 75 MG tablet Commonly known as: PLAVIX Take 1  tablet (75 mg total) by mouth daily.   COQ10 PO Take 1 capsule by mouth daily.   docusate sodium 100 MG capsule Commonly known as: COLACE Take 1 capsule (100 mg total) by mouth 2 (two) times daily.   furosemide 20 MG tablet Commonly known as: LASIX Take 1 tablet (20 mg total) by mouth daily as needed for edema.   hydrALAZINE 50 MG tablet Commonly known as: APRESOLINE Take 1 tablet (50 mg total) by mouth every 8 (eight) hours.   isosorbide mononitrate 30 MG 24 hr tablet Commonly known as: IMDUR Take 0.5 tablets (15 mg total) by mouth daily.   losartan 100 MG tablet Commonly known as: COZAAR Take 1 tablet (100 mg total) by mouth daily.   LUBRICANT DROPS OP Place 1 drop into both eyes 3 (three) times daily as needed (allergy eyes).   metFORMIN 500 MG 24 hr tablet Commonly known as: GLUCOPHAGE-XR Take 2 tablets (1,000 mg total) by mouth every evening.   metoprolol tartrate 50 MG tablet Commonly known as: LOPRESSOR Take 1 tablet (50 mg total) by mouth 2 (two) times daily.   nitroGLYCERIN 0.4 MG SL tablet Commonly known as: NITROSTAT Place 1 tablet (0.4 mg total) under the tongue every 5 (five) minutes x 3 doses as needed for chest pain.   ondansetron 4 MG tablet Commonly known as: ZOFRAN Take 1 tablet (4 mg total) by mouth every 6 (six) hours as needed for nausea.   oxyCODONE 5 MG immediate release tablet Commonly known as: Oxy IR/ROXICODONE Take 1-2 tablets (5-10 mg total) by mouth every 6 (six) hours as needed for moderate pain (pain score 4-6).   senna 8.6 MG Tabs tablet Commonly known as: SENOKOT Take 1 tablet (8.6 mg total) by mouth 2 (two) times daily.   zolpidem 10 MG tablet Commonly known as: Ambien Take 1 tablet (10 mg total) by mouth at bedtime as needed for sleep.       Diagnostic Studies: Dg Pelvis Portable  Result Date: 04/25/2019 CLINICAL DATA:  Status post right hip replacement EXAM: PORTABLE PELVIS 1 VIEWS COMPARISON:  None. FINDINGS: Right hip  prosthesis is noted in satisfactory position. No acute bony or soft tissue abnormality is seen. IMPRESSION: Status post right hip replacement Electronically Signed   By: Inez Catalina M.D.   On: 04/25/2019 11:42   Dg C-arm 1-60 Min-no Report  Result Date: 04/25/2019 Fluoroscopy was utilized by the requesting physician.  No radiographic interpretation.   Dg Hip Operative Unilat W Or W/o Pelvis Right  Result Date: 04/25/2019 CLINICAL DATA:  Right hip replacement EXAM: OPERATIVE RIGHT HIP WITH  PELVIS COMPARISON:  None. FLUOROSCOPY TIME:  Radiation Exposure Index (as provided by the fluoroscopic device): 2.92 mGy If the device does not provide the exposure index: Fluoroscopy Time:  18 seconds Number of Acquired Images:  6 FINDINGS: Initial images demonstrate degenerative changes of the right hip joint. Pelvic ring is intact. Right hip prosthesis is subsequently placed in satisfactory position. No soft tissue abnormality is noted. IMPRESSION: Right hip replacement Electronically Signed   By: Inez Catalina M.D.   On: 04/25/2019 11:43    Disposition: Discharge disposition: 01-Home or Self Care       Discharge Instructions    Call MD / Call 911   Complete by: As directed    If you experience chest pain or shortness of breath, CALL 911 and be transported to the hospital emergency room.  If you develope a fever above 101 F, pus (white drainage) or increased drainage or redness at the wound, or calf pain, call your surgeon's office.   Constipation Prevention   Complete by: As directed    Drink plenty of fluids.  Prune juice may be helpful.  You may use a stool softener, such as Colace (over the counter) 100 mg twice a day.  Use MiraLax (over the counter) for constipation as needed.   Diet - low sodium heart healthy   Complete by: As directed    Driving restrictions   Complete by: As directed    No driving for 6 weeks   Increase activity slowly as tolerated   Complete by: As directed    Lifting  restrictions   Complete by: As directed    No lifting for 6 weeks   TED hose   Complete by: As directed    Use stockings (TED hose) for 2 weeks on both leg(s).  You may remove them at night for sleeping.      Follow-up Information    Piers Baade, Aaron Edelman, MD. Schedule an appointment as soon as possible for a visit in 2 weeks.   Specialty: Orthopedic Surgery Why: For wound re-check Contact information: [address] [place] [address] [phone]            Signed: Hilton Cork Theon Sobotka 04/27/2019, 10:27 AM

## 2019-04-26 NOTE — Plan of Care (Signed)
Plan of care reviewed and discussed with the patient. 

## 2019-04-27 ENCOUNTER — Encounter (HOSPITAL_COMMUNITY): Payer: Self-pay | Admitting: Orthopedic Surgery

## 2019-04-27 LAB — CBC
HCT: 29.4 % — ABNORMAL LOW (ref 39.0–52.0)
Hemoglobin: 9.3 g/dL — ABNORMAL LOW (ref 13.0–17.0)
MCH: 30.4 pg (ref 26.0–34.0)
MCHC: 31.6 g/dL (ref 30.0–36.0)
MCV: 96.1 fL (ref 80.0–100.0)
Platelets: 127 10*3/uL — ABNORMAL LOW (ref 150–400)
RBC: 3.06 MIL/uL — ABNORMAL LOW (ref 4.22–5.81)
RDW: 13.9 % (ref 11.5–15.5)
WBC: 8.1 10*3/uL (ref 4.0–10.5)
nRBC: 0 % (ref 0.0–0.2)

## 2019-04-27 LAB — BASIC METABOLIC PANEL
Anion gap: 7 (ref 5–15)
BUN: 25 mg/dL — ABNORMAL HIGH (ref 8–23)
CO2: 24 mmol/L (ref 22–32)
Calcium: 8.1 mg/dL — ABNORMAL LOW (ref 8.9–10.3)
Chloride: 105 mmol/L (ref 98–111)
Creatinine, Ser: 1.01 mg/dL (ref 0.61–1.24)
GFR calc Af Amer: >60 mL/min (ref >60)
GFR calc non Af Amer: >60 mL/min (ref >60)
Glucose, Bld: 171 mg/dL — ABNORMAL HIGH (ref 70–99)
Potassium: 4.3 mmol/L (ref 3.5–5.1)
Sodium: 136 mmol/L (ref 135–145)

## 2019-04-27 LAB — GLUCOSE, CAPILLARY: Glucose-Capillary: 169 mg/dL — ABNORMAL HIGH (ref 70–99)

## 2019-04-27 NOTE — Plan of Care (Signed)
Patient ready for discharge. 

## 2019-04-27 NOTE — Progress Notes (Signed)
Patient given discharge instructions/papers. Patient has been rolled down to meet his wife at the front entrance.

## 2019-05-18 ENCOUNTER — Other Ambulatory Visit: Payer: Self-pay | Admitting: Family Medicine

## 2019-05-22 NOTE — Telephone Encounter (Signed)
Spoke with wife he did a Soil scientist - ( I can not find in chart) - also drug screen was done day of visit ( is in chart)  - she states that he  did not get OXY due to contract with you. Did not want to break contract.   Please address.

## 2019-05-22 NOTE — Research (Signed)
Late entry: Spoke with patient on phone for his V10 follow up. He is doing well, no complaints of cp or sob. He is getting prepared to have knee surgery. No med changes per patient.                                     "CONSENT"   YES     NO   Continuing further Investigational Product and study visits for follow-up? [x]  []   Continuing consent from future biomedical research [x]  []                                    "EVENTS"    YES     NO  AE   (IF YES SEE SOURCE) []  [x]   SAE  (IF YES SEE SOURCE) []  [x]   ENDPOINT   (IF YES SEE SOURCE) []  [x]   REVASCULARIZATION  (IF YES SEE SOURCE) []  [x]   AMPUTATION   (IF YES SEE SOURCE) []  [x]   TROPONIN'S  (IF YES SEE SOURCE) []  [x] 

## 2019-05-22 NOTE — Telephone Encounter (Signed)
Called pt = LM =aware [facility] to [facility] (was on hold, but still active)

## 2019-05-22 NOTE — Telephone Encounter (Signed)
Sorry, I was off 05/18/2019-05/21/2019. He and I did do a controlled substance agreement for Norco. I see where the orthopedic D/C'd the Norco and started him on Oxycodone but I do not see where he has gotten this filled. Can we get some more information about this?

## 2019-05-22 NOTE — Telephone Encounter (Signed)
He is absolutely okay to take what the orthopedic is prescribing due to recent surgery. He and I can pick back up later. It is okay to take medication prescribed by a specialist as long as I know about it (which I did because the notes get sent to me). I am not sure why the controlled substance agreement is not scanned into his chart as I remember doing it with him.

## 2019-05-22 NOTE — Telephone Encounter (Signed)
Britney, what are you thought on this = should be in your basket from 05/18/19.  Should this not be coming form ortho??

## 2019-05-22 NOTE — Telephone Encounter (Signed)
Patient is calling again no one called him back it was routed to a provider that was on 09/04. Please advise.

## 2019-06-14 ENCOUNTER — Telehealth: Payer: Self-pay | Admitting: Family Medicine

## 2019-06-14 ENCOUNTER — Ambulatory Visit (INDEPENDENT_AMBULATORY_CARE_PROVIDER_SITE_OTHER): Payer: 59 | Admitting: Family Medicine

## 2019-06-14 ENCOUNTER — Encounter: Payer: Self-pay | Admitting: Family Medicine

## 2019-06-14 DIAGNOSIS — G894 Chronic pain syndrome: Secondary | ICD-10-CM

## 2019-06-14 DIAGNOSIS — E119 Type 2 diabetes mellitus without complications: Secondary | ICD-10-CM | POA: Diagnosis not present

## 2019-06-14 DIAGNOSIS — Z7189 Other specified counseling: Secondary | ICD-10-CM | POA: Diagnosis not present

## 2019-06-14 DIAGNOSIS — Z7185 Encounter for immunization safety counseling: Secondary | ICD-10-CM

## 2019-06-14 MED ORDER — HYDROCODONE-ACETAMINOPHEN 10-325 MG PO TABS: 1.0000 | ORAL_TABLET | Freq: Four times a day (QID) | ORAL | 0 refills | Status: DC | PRN

## 2019-06-14 NOTE — Progress Notes (Signed)
Virtual Visit via Telephone Note  I connected with [name] Foster on 06/14/19 at 8:00 AM by telephone and verified that I am speaking with the correct person using two identifiers. [name] Foster is currently located at home and nobody is currently with him during this visit. The provider, [name], FNP is located in their home at time of visit.  I discussed the limitations, risks, security and privacy concerns of performing an evaluation and management service by telephone and the availability of in person appointments. I also discussed with the patient that there may be a patient responsible charge related to this service. The patient expressed understanding and agreed to proceed.  Subjective: PCP: [name], FNP  Chief Complaint  Patient presents with  . Medication Refill   Patient has a couple of questions regarding some of his medications.   1. Patient reports he is having a lot of nausea which he has always associated with the metformin. His was switched from regular metformin 500 mg BID to metformin XR 1,000 QD by his previous PCP which did not resolve his nausea. He is worried his body isn't responding to it as his fasting blood sugars are trending up since he had hip surgery a few weeks ago. His fasting blood sugar this morning was 212.   2. He did receive Shingrix at the pharmacy and is curious if there is a second shot.  3. He has completed the oxycodone given to him by his orthopedic after his hip surgery and would like to return to the [facility] he was taking previously due to his back pain.   ROS: Per HPI  Current Outpatient Medications:  .  albuterol (VENTOLIN HFA) 108 (90 Base) MCG/ACT inhaler, Inhale 2 puffs into the lungs every 6 (six) hours as needed for wheezing or shortness of breath., Disp: 18 g, Rfl: 2 .  amLODipine (NORVASC) 5 MG tablet, Take 1 tablet (5 mg total) by mouth daily., Disp: 90 tablet, Rfl: 1 .  aspirin 81 MG chewable tablet, Chew 1  tablet (81 mg total) by mouth 2 (two) times daily., Disp: 60 tablet, Rfl: 1 .  atorvastatin (LIPITOR) 40 MG tablet, Take 1 tablet (40 mg total) by mouth daily at 6 PM., Disp: 90 tablet, Rfl: 1 .  clopidogrel (PLAVIX) 75 MG tablet, Take 1 tablet (75 mg total) by mouth daily., Disp: 90 tablet, Rfl: 1 .  Coenzyme Q10 (COQ10 PO), Take 1 capsule by mouth daily., Disp: , Rfl:  .  docusate sodium (COLACE) 100 MG capsule, Take 1 capsule (100 mg total) by mouth 2 (two) times daily., Disp: 60 capsule, Rfl: 1 .  furosemide (LASIX) 20 MG tablet, Take 1 tablet (20 mg total) by mouth daily as needed for edema., Disp: 90 tablet, Rfl: 1 .  hydrALAZINE (APRESOLINE) 50 MG tablet, Take 1 tablet (50 mg total) by mouth every 8 (eight) hours., Disp: 270 tablet, Rfl: 1 .  HYDROcodone-acetaminophen (NORCO) 10-325 MG tablet, Take 1 tablet by mouth every 6 (six) hours as needed for moderate pain or severe pain., Disp: 120 tablet, Rfl: 0 .  isosorbide mononitrate (IMDUR) 30 MG 24 hr tablet, Take 0.5 tablets (15 mg total) by mouth daily., Disp: 45 tablet, Rfl: 1 .  losartan (COZAAR) 100 MG tablet, Take 1 tablet (100 mg total) by mouth daily., Disp: 90 tablet, Rfl: 1 .  metFORMIN (GLUCOPHAGE-XR) 500 MG 24 hr tablet, Take 2 tablets (1,000 mg total) by mouth every evening., Disp: 180 tablet, Rfl:  1 .  metoprolol tartrate (LOPRESSOR) 50 MG tablet, Take 1 tablet (50 mg total) by mouth 2 (two) times daily., Disp: 180 tablet, Rfl: 1 .  nitroGLYCERIN (NITROSTAT) 0.4 MG SL tablet, Place 1 tablet (0.4 mg total) under the tongue every 5 (five) minutes x 3 doses as needed for chest pain., Disp: 25 tablet, Rfl: 11 .  ondansetron (ZOFRAN) 4 MG tablet, Take 1 tablet (4 mg total) by mouth every 6 (six) hours as needed for nausea., Disp: 20 tablet, Rfl: 0 .  Polyvinyl Alcohol (LUBRICANT DROPS OP), Place 1 drop into both eyes 3 (three) times daily as needed (allergy eyes)., Disp: , Rfl:  .  senna (SENOKOT) 8.6 MG TABS tablet, Take 1 tablet (8.6  mg total) by mouth 2 (two) times daily., Disp: 120 tablet, Rfl: 0 .  zolpidem (AMBIEN) 10 MG tablet, Take 1 tablet (10 mg total) by mouth at bedtime as needed for sleep., Disp: 30 tablet, Rfl: 1  No Known Allergies Past Medical History:  Diagnosis Date  . CAD (coronary artery disease)    a. 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 DES to left PDA 09/20/17 // c. 10/19: LCx and LPDA stents ok; s/p DES to pLAD and POBA to D1  . Diabetes mellitus without complication ([place])    type 2   . Heart attack ([place])    " a blood clot is actually what caused my heart attack in 2018 " , he states he is unsure where the blood clot originated from   . Hyperlipidemia   . Hypertension   . Insomnia   . Pulmonary hypertension ([place])    a. TTE 03/25/17; EF 60-65%, nl WM, LV diastolic fxn nl, trivial AI, RV cavity size, wall thickness, and sys fxn nl, trivial TR, PASP 39 mmHg, trivial pericardial effusion  . Sleep apnea    could not use a cpap  . Wears dentures    full top-partial bottom  . Wears glasses     Observations/Objective: A&O  No respiratory distress or wheezing audible over the phone Mood, judgement, and thought processes all WNL  Assessment and Plan: 1. Type 2 diabetes mellitus without complication, without long-term current use of insulin ([place]) - Encouraged patient to split metformin XR into 500 mg BID and to take with food to see if that is more tolerable. Discussed that metformin is a good medication for diabetes but that if he still cannot tolerate it, we will discontinue and put him on something else.   2. Chronic pain syndrome - Controlled substance agreement signed and urine drug screen completed previously. PDMP reviewed and there are no concerns. Norco refilled. Will try to decrease amount of hydrocodone over time. We will discuss this further at his next visit.    3. Vaccine counseling - Educated patient that yes he will need a second Shingrix vaccine 2-6 months after he received the first.    Follow Up Instructions:  I discussed the assessment and treatment plan with the patient. The patient was provided an opportunity to ask questions and all were answered. The patient agreed with the plan and demonstrated an understanding of the instructions.   The patient was advised to call back or seek an in-person evaluation if the symptoms worsen or if the condition fails to improve as anticipated.  The above assessment and management plan was discussed with the  patient. The patient verbalized understanding of and has agreed to the management plan. Patient is aware to call the clinic if symptoms persist or worsen. Patient is aware when to return to the clinic for a follow-up visit. Patient educated on when it is appropriate to go to the emergency department.   Time call ended: 8:08 AM  I provided 10 minutes of non-face-to-face time during this encounter.  [name], MSN, APRN, FNP-C [facility] 06/14/19

## 2019-06-14 NOTE — Telephone Encounter (Signed)
Kathy aware.

## 2019-06-26 ENCOUNTER — Ambulatory Visit: Payer: 59 | Admitting: Physician Assistant

## 2019-07-02 NOTE — Progress Notes (Signed)
Cardiology Office Note:    Date:  07/03/2019   ID:  [name] Foster, DOB [id number], MRN [id number]  PCP:  [name], FNP  Cardiologist:  Sherren Mocha, MD  Electrophysiologist:  None   Referring MD: [name], FNP   Chief Complaint  Patient presents with  . Follow-up    CAD    History of Present Illness:    [name] Foster is a 63 y.o. male with:   Coronary artery disease   S/p Inf-Post STEMI 03/2017 >> PCI:  DES to LCx   S/p DES to the L PDA in 09/2017 2/2 to [place]  S/p NSTEMI 06/2018 >> PCI:  Orbital atherectomy and DES to LAD  Diabetes mellitus   Hypertension   Hyperlipidemia   OSA (intol of CPAP)   [name] Foster was last seen in 12/2018 via Telemedicine.  He returns for follow-up.  He underwent right hip replacement in August.  He is increasing his activity now.  He has not had chest discomfort or significant shortness of breath.  He has not had orthopnea, paroxysmal nocturnal dyspnea or syncope.  He does note some ankle swelling.  He takes as needed furosemide.  His right side has been somewhat worse than his left since his surgery.  His swelling does improve somewhat with leg elevation.  Prior CV studies:   The following studies were reviewed today:  PCI 07/03/18 Orbital atherectomy + 2.5 x 15 mm Sierra DES to prox LAD  Cardiac Catheterization 06/30/18 LAD ost 25, prox 70 (DFR 0.71); D1 90 LCx mid stent patentdist 30; OM2 60; LPDA ost stent 40 ISR RCA mid 25; AM 50 EF 55-65  Cardiac Catheterization 09/20/17 LAD ost 25, prox 65, FFR 0.83 LCx mid stent patent, dist 30, OM2 60, LPDA ost 60, 90 RCA mid 50; AM 50 EF 55-65 PCI:  2.25 x 28 mm Synergy DES to LPDA  Echo 03/25/17 EF 60-65, no RWMA, PASP 39, trivial eff  LHC 03/25/17 LAD prox 50 LCx mid 99; OM1 50 RCA mid 50 PCI: 3.5 x 24 mm Promus Premier DES to mid LCx  Myoview 3/12 EF 65, atten artifact   Past Medical History:  Diagnosis Date  . CAD (coronary artery disease)    a.  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 DES to left PDA 09/20/17 // c. 10/19: LCx and LPDA stents ok; s/p DES to pLAD and POBA to D1  . Diabetes mellitus without complication ([place])    type 2   . Heart attack ([place])    " a blood clot is actually what caused my heart attack in 2018 " , he states he is unsure where the blood clot originated from   . Hyperlipidemia   . Hypertension   . Insomnia   . Pulmonary hypertension ([place])    a. TTE 03/25/17; EF 60-65%, nl WM, LV diastolic fxn nl, trivial AI, RV cavity size, wall thickness, and sys fxn nl, trivial TR, PASP 39 mmHg, trivial pericardial effusion  . Sleep apnea    could not use a cpap  . Wears dentures    full top-partial bottom  . Wears glasses    Surgical Hx: The patient  has a past surgical history that includes Tonsillectomy; Colonoscopy; Total knee arthroplasty (2002); Knee arthroscopy; Shoulder arthroscopy (2012); Shoulder arthroscopy; Ganglion cyst excision (Left, 07/25/2014); LEFT HEART CATH AND  CORONARY ANGIOGRAPHY (N/A, 03/25/2017); Coronary/Graft Acute MI Revascularization (N/A, 03/25/2017); TEMPORARY PACEMAKER (N/A, 03/25/2017); LEFT HEART CATH AND CORONARY ANGIOGRAPHY (N/A, 09/20/2017); INTRAVASCULAR PRESSURE WIRE/FFR STUDY (N/A, 09/20/2017); CORONARY STENT INTERVENTION (N/A, 09/20/2017); LEFT HEART CATH AND CORONARY ANGIOGRAPHY (N/A, 06/30/2018); INTRAVASCULAR PRESSURE WIRE/FFR STUDY (N/A, 06/30/2018); CORONARY STENT INTERVENTION (N/A, 07/03/2018); CORONARY ATHERECTOMY (N/A, 07/03/2018); Total hip arthroplasty (Right, 04/25/2019); and Total hip arthroplasty (Right, 04/25/2019).   Current Medications: Current Meds  Medication Sig  . albuterol (VENTOLIN HFA) 108 (90 Base) MCG/ACT inhaler Inhale 2 puffs into the lungs every 6 (six) hours as needed for wheezing or shortness of breath.  Marland Kitchen amLODipine (NORVASC) 5 MG tablet Take 1  tablet (5 mg total) by mouth daily.  Marland Kitchen amoxicillin (AMOXIL) 500 MG tablet amoxicillin 500 mg tablet  Take 4 tablet(s) 1-2 hours prior to dental work by oral route.  Marland Kitchen aspirin 81 MG chewable tablet Chew 81 mg by mouth daily.  Marland Kitchen atorvastatin (LIPITOR) 40 MG tablet Take 1 tablet (40 mg total) by mouth daily at 6 PM.  . clopidogrel (PLAVIX) 75 MG tablet Take 1 tablet (75 mg total) by mouth daily.  . Coenzyme Q10 (COQ10 PO) Take 1 capsule by mouth daily.  . furosemide (LASIX) 20 MG tablet Take 1 tablet (20 mg total) by mouth daily as needed for edema.  . hydrALAZINE (APRESOLINE) 50 MG tablet Take 1 tablet (50 mg total) by mouth every 8 (eight) hours.  Marland Kitchen HYDROcodone-acetaminophen (NORCO) 10-325 MG tablet Take 1 tablet by mouth every 6 (six) hours as needed for moderate pain or severe pain.  . isosorbide mononitrate (IMDUR) 30 MG 24 hr tablet Take 0.5 tablets (15 mg total) by mouth daily.  Marland Kitchen losartan (COZAAR) 100 MG tablet Take 1 tablet (100 mg total) by mouth daily.  . metFORMIN (GLUCOPHAGE-XR) 500 MG 24 hr tablet Take 2 tablets (1,000 mg total) by mouth every evening.  . metoprolol tartrate (LOPRESSOR) 50 MG tablet Take 1 tablet (50 mg total) by mouth 2 (two) times daily.  . nitroGLYCERIN (NITROSTAT) 0.4 MG SL tablet Place 1 tablet (0.4 mg total) under the tongue every 5 (five) minutes x 3 doses as needed for chest pain.  . Polyvinyl Alcohol (LUBRICANT DROPS OP) Place 1 drop into both eyes 3 (three) times daily as needed (allergy eyes).     Allergies:   Patient has no known allergies.   Social History   Tobacco Use  . Smoking status: Former Smoker    Packs/day: 2.00    Years: 30.00    Pack years: 60.00    Quit date: 03/25/2017    Years since quitting: 2.2  . Smokeless tobacco: Never Used  Substance Use Topics  . Alcohol use: Yes    Alcohol/week: 3.0 standard drinks    Types: 1 Glasses of wine, 1 Cans of beer, 1 Shots of liquor per week    Comment: drinks daily  beer and whiskey  . Drug  use: No     Family Hx: The patient's family history includes Heart disease in his maternal grandfather; Hypertension in his brother, father, and mother.  ROS:   Please see the history of present illness.    ROS All other systems reviewed and are negative.   EKGs/Labs/Other Test Reviewed:    EKG:  EKG is   ordered today.  The ekg ordered today demonstrates normal sinus rhythm, heart rate 63, normal axis, inferior Q waves, QTC 440, no change from prior tracing  Recent Labs: 04/19/2019: ALT 47 04/27/2019: BUN 25; Creatinine, Ser  1.01; Hemoglobin 9.3; Platelets 127; Potassium 4.3; Sodium 136   Recent Lipid Panel Lab Results  Component Value Date/Time   CHOL 118 09/29/2018 08:36 AM   TRIG 90 09/29/2018 08:36 AM   HDL 45 09/29/2018 08:36 AM   CHOLHDL 2.6 09/29/2018 08:36 AM   CHOLHDL 4.4 07/01/2018 03:34 AM   LDLCALC 55 09/29/2018 08:36 AM    Physical Exam:    VS:  BP 130/78   Pulse 70   Ht 5\' 9"  (1.753 m)   Wt 207 lb 1.9 oz (93.9 kg)   SpO2 96%   BMI 30.59 kg/m     Wt Readings from Last 3 Encounters:  07/03/19 207 lb 1.9 oz (93.9 kg)  04/25/19 204 lb (92.5 kg)  04/19/19 204 lb (92.5 kg)     Physical Exam  Constitutional: He is oriented to person, place, and time. He appears well-developed and well-nourished. No distress.  HENT:  Head: Normocephalic and atraumatic.  Neck: Neck supple. No JVD present.  Cardiovascular: Normal rate, regular rhythm, S1 normal and S2 normal.  No murmur heard. Pulmonary/Chest: Breath sounds normal. He has no rales.  Abdominal: Soft. There is no hepatomegaly.  Musculoskeletal:        General: Edema (tr-1+ bilat LE edema (R>L)) present.  Neurological: He is alert and oriented to person, place, and time.  Skin: Skin is warm and dry.    ASSESSMENT & PLAN:    1. Coronary artery disease involving native coronary artery of native heart with unstable angina pectoris ([place]) History of inferoposterior ST elevation myocardial infarction in July  2018 treated with drug-eluting stent to the LCx, DES to the left PDA in January 2019 and non-ST elevation myocardial infarction in October 2019 treated with drug-eluting stent to the LAD.  He is doing well without angina.  Most recent lipid panel demonstrated optimal LDL.  Continue dual antiplatelet therapy with aspirin, clopidogrel.  Continue atorvastatin, metoprolol.  2. Essential hypertension The patient's blood pressure is controlled on his current regimen.  Continue current therapy.   3. Leg swelling This seems to be related to mainly venous insufficiency.  I have recommended compression, elevation and as needed diuretic therapy.   Dispo:  Return in about 1 year (around 07/02/2020) for Routine Follow Up, w/ Dr. Burt Knack, or Richardson Dopp, PA-C, in person.   Medication Adjustments/Labs and Tests Ordered: Current medicines are reviewed at length with the patient today.  Concerns regarding medicines are outlined above.  Tests Ordered: Orders Placed This Encounter  Procedures  . EKG 12-Lead   Medication Changes: No orders of the defined types were placed in this encounter.   Signed, Richardson Dopp, PA-C  07/03/2019 10:54 AM    [facility] [address], [place], [place]  [address] Phone: [phone]; Fax: [phone]

## 2019-07-03 ENCOUNTER — Ambulatory Visit: Payer: 59 | Admitting: Physician Assistant

## 2019-07-03 ENCOUNTER — Other Ambulatory Visit: Payer: Self-pay

## 2019-07-03 ENCOUNTER — Encounter: Payer: Self-pay | Admitting: Physician Assistant

## 2019-07-03 VITALS — BP 130/78 | HR 70 | Ht 69.0 in | Wt 207.1 lb

## 2019-07-03 DIAGNOSIS — M7989 Other specified soft tissue disorders: Secondary | ICD-10-CM

## 2019-07-03 DIAGNOSIS — I10 Essential (primary) hypertension: Secondary | ICD-10-CM | POA: Diagnosis not present

## 2019-07-03 DIAGNOSIS — I25110 Atherosclerotic heart disease of native coronary artery with unstable angina pectoris: Secondary | ICD-10-CM | POA: Diagnosis not present

## 2019-07-03 NOTE — Patient Instructions (Signed)
Medication Instructions:  Your physician recommends that you continue on your current medications as directed. Please refer to the Current Medication list given to you today.  *If you need a refill on your cardiac medications before your next appointment, please call your pharmacy*  Lab Work: None ordered  If you have labs (blood work) drawn today and your tests are completely normal, you will receive your results only by: Marland Kitchen MyChart Message (if you have MyChart) OR . A paper copy in the mail If you have any lab test that is abnormal or we need to change your treatment, we will call you to review the results.  Testing/Procedures: None ordered  Follow-Up: At [facility], you and your health needs are our priority.  As part of our continuing mission to provide you with exceptional heart care, we have created designated Provider Care Teams.  These Care Teams include your primary Cardiologist (physician) and Advanced Practice Providers (APPs -  Physician Assistants and Nurse Practitioners) who all work together to provide you with the care you need, when you need it.  Your next appointment:   1 YEAR   The format for your next appointment:   In Person  Provider:   You may see Sherren Mocha, MD or one of the following Advanced Practice Providers on your designated Care Team:    Richardson Dopp, PA-C  [place], [place]  Daune Perch, NP   Other Instructions

## 2019-07-10 ENCOUNTER — Encounter: Payer: Self-pay | Admitting: *Deleted

## 2019-07-10 DIAGNOSIS — Z006 Encounter for examination for normal comparison and control in clinical research program: Secondary | ICD-10-CM

## 2019-07-10 NOTE — Research (Signed)
[facility] V11  Completed on phone due to Covid-19 pandemic Patient doing well, no complaints of cp or sob. His hip surgery went well, and he is back in cardiac rehab.  Thanked him for participating in the research study.                                    "CONSENT"   YES     NO   Continuing further Investigational Product and study visits for follow-up? [x]  []   Continuing consent from future biomedical research [x]  []                                    "EVENTS"    YES     NO  AE   (IF YES SEE SOURCE) []  [x]   SAE  (IF YES SEE SOURCE) []  [x]   ENDPOINT   (IF YES SEE SOURCE) []  [x]   REVASCULARIZATION  (IF YES SEE SOURCE) []  [x]   AMPUTATION   (IF YES SEE SOURCE) []  [x]   TROPONIN'S  (IF YES SEE SOURCE) []  [x]    Lifestyle Adherence Assessment:   YES NO  Abstinence from smoking/remaining tobacco free X   Cardiac Diet X   Routine physical activity and/or cardiac rehabilitation X     Current Outpatient Medications:  .  albuterol (VENTOLIN HFA) 108 (90 Base) MCG/ACT inhaler, Inhale 2 puffs into the lungs every 6 (six) hours as needed for wheezing or shortness of breath., Disp: 18 g, Rfl: 2 .  amLODipine (NORVASC) 5 MG tablet, Take 1 tablet (5 mg total) by mouth daily., Disp: 90 tablet, Rfl: 1 .  amoxicillin (AMOXIL) 500 MG tablet, amoxicillin 500 mg tablet  Take 4 tablet(s) 1-2 hours prior to dental work by oral route., Disp: , Rfl:  .  aspirin 81 MG chewable tablet, Chew 81 mg by mouth daily., Disp: , Rfl:  .  atorvastatin (LIPITOR) 40 MG tablet, Take 1 tablet (40 mg total) by mouth daily at 6 PM., Disp: 90 tablet, Rfl: 1 .  clopidogrel (PLAVIX) 75 MG tablet, Take 1 tablet (75 mg total) by mouth daily., Disp: 90 tablet, Rfl: 1 .  Coenzyme Q10 (COQ10 PO), Take 1 capsule by mouth daily., Disp: , Rfl:  .  furosemide (LASIX) 20 MG tablet, Take 1 tablet (20 mg total) by mouth daily as needed for edema., Disp: 90 tablet, Rfl: 1 .  hydrALAZINE (APRESOLINE) 50 MG tablet, Take 1 tablet (50 mg total) by  mouth every 8 (eight) hours., Disp: 270 tablet, Rfl: 1 .  HYDROcodone-acetaminophen (NORCO) 10-325 MG tablet, Take 1 tablet by mouth every 6 (six) hours as needed for moderate pain or severe pain., Disp: 120 tablet, Rfl: 0 .  isosorbide mononitrate (IMDUR) 30 MG 24 hr tablet, Take 0.5 tablets (15 mg total) by mouth daily., Disp: 45 tablet, Rfl: 1 .  losartan (COZAAR) 100 MG tablet, Take 1 tablet (100 mg total) by mouth daily., Disp: 90 tablet, Rfl: 1 .  metFORMIN (GLUCOPHAGE-XR) 500 MG 24 hr tablet, Take 2 tablets (1,000 mg total) by mouth every evening., Disp: 180 tablet, Rfl: 1 .  metoprolol tartrate (LOPRESSOR) 50 MG tablet, Take 1 tablet (50 mg total) by mouth 2 (two) times daily., Disp: 180 tablet, Rfl: 1 .  nitroGLYCERIN (NITROSTAT) 0.4 MG SL tablet, Place 1 tablet (0.4 mg total) under the tongue every 5 (five) minutes x  3 doses as needed for chest pain., Disp: 25 tablet, Rfl: 11 .  Polyvinyl Alcohol (LUBRICANT DROPS OP), Place 1 drop into both eyes 3 (three) times daily as needed (allergy eyes)., Disp: , Rfl:

## 2019-07-17 NOTE — Progress Notes (Signed)
Assessment & Plan:  1-2. Chronic pain syndrome/Controlled substance agreement signed - Norco decreased from 10/325 to 7.5/325. We will obtain imaging of patient's back to work-up his pain, just not at this time with the new diagnosis of lung cancer for his wife. He has too much on his plate right now.  - HYDROcodone-acetaminophen (NORCO) 7.5-325 MG tablet; Take 1 tablet by mouth every 6 (six) hours as needed for moderate pain or severe pain.  Dispense: 120 tablet; Refill: 0 - HYDROcodone-acetaminophen (NORCO) 7.5-325 MG tablet; Take 1 tablet by mouth every 6 (six) hours as needed for moderate pain or severe pain.  Dispense: 120 tablet; Refill: 0 - HYDROcodone-acetaminophen (NORCO) 7.5-325 MG tablet; Take 1 tablet by mouth every 6 (six) hours as needed for moderate pain or severe pain.  Dispense: 120 tablet; Refill: 0  3. Stress due to illness of family member - Discussed the Clonazepam will not be for long term use but to help him during this crisis time. He understands. He also understands not to take this near his Norco due to sedating effects of both.  - clonazePAM (KLONOPIN) 0.25 MG disintegrating tablet; Take 1 tablet (0.25 mg total) by mouth 2 (two) times daily as needed.  Dispense: 60 tablet; Refill: 0  4. Type 2 diabetes mellitus without complication, without long-term current use of insulin (HCC) - A1c 6.9 < three months ago. Blood sugars improving at home.  - Microalbumin / creatinine urine ratio  5. Colon cancer screening - Ambulatory referral to Gastroenterology  6. Need for immunization against influenza - Flu Vaccine QUAD High Dose(Fluad)   Return in about 3 months (around 10/19/2019) for DM, stress, pain.  [name], MSN, APRN, FNP-C [facility]  Subjective:    Patient ID: [name] Foster, male    DOB: 1955/10/06, 63 y.o.   MRN: [id number]  Patient Care Team: [name], FNP as PCP - General (Family Medicine) Sherren Mocha, MD as  Consulting Physician (Cardiology) [facility] as Consulting Physician (Orthopedic Surgery)   Chief Complaint:  Chief Complaint  Patient presents with  . Back Pain  . Stress    HPI: [name] Foster is a 63 y.o. male presenting on 07/19/2019 for Back Pain and Stress  Patient reports his hip is doing great since his surgery and he doesn't even know where his cane is anymore! His pain is now mainly in his back. It does not occur EVERY day but most days. Certain things make is worse such as when he just drove to [place] to see his daughter. The car ride made his back hurt. He states on the days his back hurts the pain is 6-7/10 on average without medication. The Norco 10/325 brings it down to a 2-3/10. He was previously taking Norco 7.5/10 before his hip started bothering him and is agreeable to going back down to that dosage.   He is no longer taking medication to help sleep and was doing well until recently when his wife started having some health issues. They just found out yesterday she has lung cancer. She has a biopsy scheduled for next week to determine staging. He has been very upset since getting this news.    Social history:  Relevant past medical, surgical, family and social history reviewed and updated as indicated. Interim medical history since our last visit reviewed.  Allergies and medications reviewed and updated.  DATA REVIEWED: CHART IN EPIC  ROS: Negative unless specifically indicated above in HPI.    Current  Outpatient Medications:  .  albuterol (VENTOLIN HFA) 108 (90 Base) MCG/ACT inhaler, Inhale 2 puffs into the lungs every 6 (six) hours as needed for wheezing or shortness of breath., Disp: 18 g, Rfl: 2 .  amLODipine (NORVASC) 5 MG tablet, Take 1 tablet (5 mg total) by mouth daily., Disp: 90 tablet, Rfl: 1 .  aspirin 81 MG chewable tablet, Chew 81 mg by mouth daily., Disp: , Rfl:  .  atorvastatin (LIPITOR) 40 MG tablet, Take 1 tablet (40 mg total) by mouth daily  at 6 PM., Disp: 90 tablet, Rfl: 1 .  clopidogrel (PLAVIX) 75 MG tablet, Take 1 tablet (75 mg total) by mouth daily., Disp: 90 tablet, Rfl: 1 .  Coenzyme Q10 (COQ10 PO), Take 1 capsule by mouth daily., Disp: , Rfl:  .  furosemide (LASIX) 20 MG tablet, Take 1 tablet (20 mg total) by mouth daily as needed for edema., Disp: 90 tablet, Rfl: 1 .  hydrALAZINE (APRESOLINE) 50 MG tablet, Take 1 tablet (50 mg total) by mouth every 8 (eight) hours., Disp: 270 tablet, Rfl: 1 .  isosorbide mononitrate (IMDUR) 30 MG 24 hr tablet, Take 0.5 tablets (15 mg total) by mouth daily., Disp: 45 tablet, Rfl: 1 .  losartan (COZAAR) 100 MG tablet, Take 1 tablet (100 mg total) by mouth daily., Disp: 90 tablet, Rfl: 1 .  metFORMIN (GLUCOPHAGE-XR) 500 MG 24 hr tablet, Take 2 tablets (1,000 mg total) by mouth every evening., Disp: 180 tablet, Rfl: 1 .  metoprolol tartrate (LOPRESSOR) 50 MG tablet, Take 1 tablet (50 mg total) by mouth 2 (two) times daily., Disp: 180 tablet, Rfl: 1 .  nitroGLYCERIN (NITROSTAT) 0.4 MG SL tablet, Place 1 tablet (0.4 mg total) under the tongue every 5 (five) minutes x 3 doses as needed for chest pain., Disp: 25 tablet, Rfl: 11 .  Polyvinyl Alcohol (LUBRICANT DROPS OP), Place 1 drop into both eyes 3 (three) times daily as needed (allergy eyes)., Disp: , Rfl:  .  clonazePAM (KLONOPIN) 0.25 MG disintegrating tablet, Take 1 tablet (0.25 mg total) by mouth 2 (two) times daily as needed., Disp: 60 tablet, Rfl: 0 .  [START ON 09/16/2019] HYDROcodone-acetaminophen (NORCO) 7.5-325 MG tablet, Take 1 tablet by mouth every 6 (six) hours as needed for moderate pain or severe pain., Disp: 120 tablet, Rfl: 0 .  [START ON 08/17/2019] HYDROcodone-acetaminophen (NORCO) 7.5-325 MG tablet, Take 1 tablet by mouth every 6 (six) hours as needed for moderate pain or severe pain., Disp: 120 tablet, Rfl: 0 .  HYDROcodone-acetaminophen (NORCO) 7.5-325 MG tablet, Take 1 tablet by mouth every 6 (six) hours as needed for moderate pain  or severe pain., Disp: 120 tablet, Rfl: 0   No Known Allergies Past Medical History:  Diagnosis Date  . CAD (coronary artery disease)    a. 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 DES to left PDA 09/20/17 // c. 10/19: LCx and LPDA stents ok; s/p DES to pLAD and POBA to D1  . Diabetes mellitus without complication ([place])    type 2   . Heart attack ([place])    " a blood clot is actually what caused my heart attack in 2018 " , he states he is unsure where the blood clot originated from   . Hyperlipidemia   . Hypertension   . Insomnia   . Pulmonary hypertension ([place])    a.  TTE 03/25/17; EF 60-65%, nl WM, LV diastolic fxn nl, trivial AI, RV cavity size, wall thickness, and sys fxn nl, trivial TR, PASP 39 mmHg, trivial pericardial effusion  . Sleep apnea    could not use a cpap  . Wears dentures    full top-partial bottom  . Wears glasses     Past Surgical History:  Procedure Laterality Date  . COLONOSCOPY    . CORONARY ATHERECTOMY N/A 07/03/2018   Procedure: CORONARY ATHERECTOMY;  Surgeon: Burnell Blanks, MD;  Location: [place] [facility];  Service: Cardiovascular;  Laterality: N/A;  . CORONARY STENT INTERVENTION N/A 09/20/2017   Procedure: CORONARY STENT INTERVENTION;  Surgeon: Leonie Man, MD;  Location: [place] [facility];  Service: Cardiovascular;  Laterality: N/A;  . CORONARY STENT INTERVENTION N/A 07/03/2018   Procedure: CORONARY STENT INTERVENTION;  Surgeon: Burnell Blanks, MD;  Location: [place] [facility];  Service: Cardiovascular;  Laterality: N/A;  . CORONARY/GRAFT ACUTE MI REVASCULARIZATION N/A 03/25/2017   Procedure: Coronary/Graft Acute MI Revascularization;  Surgeon: Sherren Mocha, MD;  Location: [place] [facility];  Service: Cardiovascular;  Laterality: N/A;  . GANGLION CYST EXCISION Left 07/25/2014   Procedure: EXCISION VOLAR AND  DORSAL GANGLION LEFT WRIST;  Surgeon: Leanora Cover, MD;  Location: [place];  Service: Orthopedics;  Laterality: Left;  . INTRAVASCULAR PRESSURE WIRE/FFR STUDY N/A 09/20/2017   Procedure: INTRAVASCULAR PRESSURE WIRE/FFR STUDY;  Surgeon: Leonie Man, MD;  Location: [place] [facility];  Service: Cardiovascular;  Laterality: N/A;  . INTRAVASCULAR PRESSURE WIRE/FFR STUDY N/A 06/30/2018   Procedure: INTRAVASCULAR PRESSURE WIRE/FFR STUDY;  Surgeon: Nelva Bush, MD;  Location: [place] [facility];  Service: Cardiovascular;  Laterality: N/A;  . KNEE ARTHROSCOPY     rifgr and left  . LEFT HEART CATH AND CORONARY ANGIOGRAPHY N/A 03/25/2017   Procedure: Left Heart Cath and Coronary Angiography;  Surgeon: Sherren Mocha, MD;  Location: [place] [facility];  Service: Cardiovascular;  Laterality: N/A;  . LEFT HEART CATH AND CORONARY ANGIOGRAPHY N/A 09/20/2017   Procedure: LEFT HEART CATH AND CORONARY ANGIOGRAPHY;  Surgeon: Leonie Man, MD;  Location: [place] [facility];  Service: Cardiovascular;  Laterality: N/A;  . LEFT HEART CATH AND CORONARY ANGIOGRAPHY N/A 06/30/2018   Procedure: LEFT HEART CATH AND CORONARY ANGIOGRAPHY;  Surgeon: Nelva Bush, MD;  Location: [place] [facility];  Service: Cardiovascular;  Laterality: N/A;  . SHOULDER ARTHROSCOPY  2012   left  . SHOULDER ARTHROSCOPY     right  . TEMPORARY PACEMAKER N/A 03/25/2017   Procedure: Temporary Pacemaker;  Surgeon: Sherren Mocha, MD;  Location: [place] [facility];  Service: Cardiovascular;  Laterality: N/A;  . TONSILLECTOMY    . TOTAL HIP ARTHROPLASTY Right 04/25/2019  . TOTAL HIP ARTHROPLASTY Right 04/25/2019   Procedure: TOTAL HIP ARTHROPLASTY ANTERIOR APPROACH;  Surgeon: Rod Can, MD;  Location: [facility];  Service: Orthopedics;  Laterality: Right;  . TOTAL KNEE ARTHROPLASTY  2002   left    Social History   Socioeconomic History  . Marital status: Married    Spouse name: Not on file  . Number of  children: Not on file  . Years of education: Not on file  . Highest education level: Not on file  Occupational History  . Occupation: Retired  Scientific laboratory technician  . Financial resource strain: Not on file  . Food insecurity    Worry: Not on file    Inability: Not on file  . Transportation needs  Medical: Not on file    Non-medical: Not on file  Tobacco Use  . Smoking status: Former Smoker    Packs/day: 2.00    Years: 30.00    Pack years: 60.00    Quit date: 03/25/2017    Years since quitting: 2.3  . Smokeless tobacco: Never Used  Substance and Sexual Activity  . Alcohol use: Yes    Alcohol/week: 3.0 standard drinks    Types: 1 Glasses of wine, 1 Cans of beer, 1 Shots of liquor per week    Comment: drinks daily  beer and whiskey  . Drug use: No  . Sexual activity: Not Currently  Lifestyle  . Physical activity    Days per week: Not on file    Minutes per session: Not on file  . Stress: Not on file  Relationships  . Social Herbalist on phone: Not on file    Gets together: Not on file    Attends religious service: Not on file    Active member of club or organization: Not on file    Attends meetings of clubs or organizations: Not on file    Relationship status: Not on file  . Intimate partner violence    Fear of current or ex partner: Not on file    Emotionally abused: Not on file    Physically abused: Not on file    Forced sexual activity: Not on file  Other Topics Concern  . Not on file  Social History Narrative   Per wife, pt was preparing to retire soon from his job at [employer].        Objective:    BP (!) 154/83   Pulse 76   Temp 99.3 F (37.4 C) (Temporal)   Ht 5\' 9"  (1.753 m)   Wt 209 lb (94.8 kg)   BMI 30.86 kg/m   Physical Exam Vitals signs reviewed.  Constitutional:      General: He is not in acute distress.    Appearance: Normal appearance. He is obese. He is not ill-appearing, toxic-appearing or diaphoretic.  HENT:     Head: Normocephalic  and atraumatic.  Eyes:     General: No scleral icterus.       Right eye: No discharge.        Left eye: No discharge.     Conjunctiva/sclera: Conjunctivae normal.  Neck:     Musculoskeletal: Normal range of motion.  Cardiovascular:     Rate and Rhythm: Normal rate and regular rhythm.     Heart sounds: Normal heart sounds. No murmur. No friction rub. No gallop.   Pulmonary:     Effort: Pulmonary effort is normal. No respiratory distress.     Breath sounds: Normal breath sounds. No stridor. No wheezing, rhonchi or rales.  Musculoskeletal: Normal range of motion.  Skin:    General: Skin is warm and dry.  Neurological:     Mental Status: He is alert and oriented to person, place, and time. Mental status is at baseline.  Psychiatric:        Attention and Perception: Attention and perception normal.        Mood and Affect: Mood is depressed. Affect is tearful.        Speech: Speech normal.        Behavior: Behavior normal.        Thought Content: Thought content normal.        Cognition and Memory: Cognition and memory normal.  Judgment: Judgment normal.    Lab Results  Component Value Date   WBC 8.1 04/27/2019   HGB 9.3 (L) 04/27/2019   HCT 29.4 (L) 04/27/2019   MCV 96.1 04/27/2019   PLT 127 (L) 04/27/2019   Lab Results  Component Value Date   NA 136 04/27/2019   K 4.3 04/27/2019   CO2 24 04/27/2019   GLUCOSE 171 (H) 04/27/2019   BUN 25 (H) 04/27/2019   CREATININE 1.01 04/27/2019   BILITOT 0.6 04/19/2019   ALKPHOS 54 04/19/2019   AST 40 04/19/2019   ALT 47 (H) 04/19/2019   PROT 7.2 04/19/2019   ALBUMIN 4.1 04/19/2019   CALCIUM 8.1 (L) 04/27/2019   ANIONGAP 7 04/27/2019   Lab Results  Component Value Date   CHOL 118 09/29/2018   Lab Results  Component Value Date   HDL 45 09/29/2018   Lab Results  Component Value Date   LDLCALC 55 09/29/2018   Lab Results  Component Value Date   TRIG 90 09/29/2018   Lab Results  Component Value Date   CHOLHDL 2.6  09/29/2018   Lab Results  Component Value Date   HGBA1C 6.9 (H) 04/25/2019

## 2019-07-18 ENCOUNTER — Other Ambulatory Visit: Payer: Self-pay

## 2019-07-19 ENCOUNTER — Other Ambulatory Visit: Payer: Self-pay

## 2019-07-19 ENCOUNTER — Ambulatory Visit: Payer: 59 | Admitting: Family Medicine

## 2019-07-19 ENCOUNTER — Encounter: Payer: Self-pay | Admitting: Family Medicine

## 2019-07-19 VITALS — BP 154/83 | HR 76 | Temp 99.3°F | Ht 69.0 in | Wt 209.0 lb

## 2019-07-19 DIAGNOSIS — Z23 Encounter for immunization: Secondary | ICD-10-CM | POA: Diagnosis not present

## 2019-07-19 DIAGNOSIS — E119 Type 2 diabetes mellitus without complications: Secondary | ICD-10-CM

## 2019-07-19 DIAGNOSIS — Z79899 Other long term (current) drug therapy: Secondary | ICD-10-CM

## 2019-07-19 DIAGNOSIS — G894 Chronic pain syndrome: Secondary | ICD-10-CM | POA: Diagnosis not present

## 2019-07-19 DIAGNOSIS — Z1211 Encounter for screening for malignant neoplasm of colon: Secondary | ICD-10-CM

## 2019-07-19 DIAGNOSIS — Z6379 Other stressful life events affecting family and household: Secondary | ICD-10-CM

## 2019-07-19 MED ORDER — HYDROCODONE-ACETAMINOPHEN 7.5-325 MG PO TABS: 1.0000 | ORAL_TABLET | Freq: Four times a day (QID) | ORAL | 0 refills | Status: DC | PRN

## 2019-07-19 MED ORDER — CLONAZEPAM 0.25 MG PO TBDP: 0.2500 mg | ORAL_TABLET | Freq: Two times a day (BID) | ORAL | 0 refills | Status: DC | PRN

## 2019-07-19 NOTE — Patient Instructions (Signed)

## 2019-07-20 LAB — MICROALBUMIN / CREATININE URINE RATIO
Creatinine, Urine: 63.6 mg/dL
Microalb/Creat Ratio: 992 mg/g creat — ABNORMAL HIGH (ref 0–29)
Microalbumin, Urine: 630.7 ug/mL

## 2019-07-24 ENCOUNTER — Encounter: Payer: Self-pay | Admitting: Internal Medicine

## 2019-08-22 ENCOUNTER — Encounter: Payer: Self-pay | Admitting: Gastroenterology

## 2019-08-22 NOTE — Progress Notes (Signed)
Referring Provider: [name], FNP Primary Care Physician:  [name], FNP Primary Gastroenterologist:  Dr. Gala Romney  Chief Complaint  Patient presents with  . Consult    TCS last done about 10 years ago  . Bloated    all the time, at times can't take a deep breath  . Nausea    no vomiting    HPI:   [name] Foster is a 63 y.o. male presenting today at the request of [name], FNP for colon cancer screening.  Patient was last seen by our staff at the time of his colonoscopy in 2011.  Past medical history significant for coronary artery disease, STEMI in 2018 and NSTEMI in 2019 with DES's in place, T2DM, HTN, HLD, pulmonary hypertension, and sleep apnea.  Colonoscopy on 05/13/2010: Single anal papilla, 2 diminutive polyps at 10 cm, left-sided diverticula, 1 sigmoid and 1 ascending polyp removed, otherwise normal colon and terminal ileum.  Pathology with tubular adenoma from ascending colon, sigmoid polyp was hyperplastic, rectal polyps were hyperplastic.  Recommendations repeat colonoscopy in 7 years.   Today:  Reports worsening of abdominal bloating over the last couple of months. Symptoms have been present over a couple a years. Especially after eating. Sometimes without eating. When he gets up in the morning bloating is improved and worsens throughout the day. Occasional nausea with it. States it gets so tight that if he bends over, he will feel like he can't take a good breath in. States his stomach makes"a terrible noise, sounds like a washing machine." Has been using gas-x and pepto-bismol and this helps minimally. Has been working with PCP with metformin thinking this may be contributing, but now doesn't think this is the problem.   He is passing a lot of gas and burping. This helps. Having at least one BM a day. Sometimes several, 3-4. Sometimes feels incomplete. Stools are normally formed, about 50%. Mushy about 25% and watery about 25%. Having a BM improves  bloating. No blood in the stool. Stools are very dark when taking pepto. Otherwise no dark stools. Hasn't noticed certain food triggers. Starting eating kimchi and seems like this is helping. Carbonated beverages- 2 in a week. Eating broccoli, green leafy vegetables, and beans regularly. Milk less than once a week. Cheese daily. Started a probiotic 2 days ago.   Admits to a lot of pressure when bloated but no abdominal pain. Nausea a couple days a week. Sometimes passes immediately. Other times drags on half a day. No vomiting. No acid reflux symptoms or heart burn. No dysphgia. No unintentional weight loss. No known family history of celiac disease.   No NSAIDs.   Alcohol use increased since COVID. Home and retired. Currently 3-6 shots of liquor daily. Used to drink 2 shots liquor daily for 10 years. No yellowing of skin or eyes, no dark urine, no confusion, no bruising or bleeding.    Past Medical History:  Diagnosis Date  . CAD (coronary artery disease)    a. 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 DES to left PDA 09/20/17 // c. 10/19: LCx and LPDA stents ok; s/p DES to pLAD and POBA to D1  . Diabetes mellitus without complication ([place])    type 2   . Heart attack ([place])    " a blood clot is actually what caused my heart attack  in 2018 " , he states he is unsure where the blood clot originated from   . Hyperlipidemia   . Hypertension   . Insomnia   . Pulmonary hypertension ([place])    a. TTE 03/25/17; EF 60-65%, nl WM, LV diastolic fxn nl, trivial AI, RV cavity size, wall thickness, and sys fxn nl, trivial TR, PASP 39 mmHg, trivial pericardial effusion  . Sleep apnea    could not use a cpap  . Wears dentures    full top-partial bottom  . Wears glasses     Past Surgical History:  Procedure Laterality Date  . COLONOSCOPY  05/13/2010   Single anal papilla, 2 diminutive  polyps at 10 cm, left-sided diverticula, one sigmoid and one a sending polyp, otherwise normal colon and TI.  Path with 1 tubular adenoma, otherwise hyperplastic polyps.  Recommendations repeat colonoscopy in 7 years.  . CORONARY ATHERECTOMY N/A 07/03/2018   Procedure: CORONARY ATHERECTOMY;  Surgeon: Burnell Blanks, MD;  Location: [facility];  Service: Cardiovascular;  Laterality: N/A;  . CORONARY STENT INTERVENTION N/A 09/20/2017   Procedure: CORONARY STENT INTERVENTION;  Surgeon: Leonie Man, MD;  Location: [facility];  Service: Cardiovascular;  Laterality: N/A;  . CORONARY STENT INTERVENTION N/A 07/03/2018   Procedure: CORONARY STENT INTERVENTION;  Surgeon: Burnell Blanks, MD;  Location: [facility];  Service: Cardiovascular;  Laterality: N/A;  . CORONARY/GRAFT ACUTE MI REVASCULARIZATION N/A 03/25/2017   Procedure: Coronary/Graft Acute MI Revascularization;  Surgeon: Sherren Mocha, MD;  Location: [facility];  Service: Cardiovascular;  Laterality: N/A;  . GANGLION CYST EXCISION Left 07/25/2014   Procedure: EXCISION VOLAR AND DORSAL GANGLION LEFT WRIST;  Surgeon: Leanora Cover, MD;  Location: [facility];  Service: Orthopedics;  Laterality: Left;  . INTRAVASCULAR PRESSURE WIRE/FFR STUDY N/A 09/20/2017   Procedure: INTRAVASCULAR PRESSURE WIRE/FFR STUDY;  Surgeon: Leonie Man, MD;  Location: [facility];  Service: Cardiovascular;  Laterality: N/A;  . INTRAVASCULAR PRESSURE WIRE/FFR STUDY N/A 06/30/2018   Procedure: INTRAVASCULAR PRESSURE WIRE/FFR STUDY;  Surgeon: Nelva Bush, MD;  Location: [facility];  Service: Cardiovascular;  Laterality: N/A;  . KNEE ARTHROSCOPY     rifgr and left  . LEFT HEART CATH AND CORONARY ANGIOGRAPHY N/A 03/25/2017   Procedure: Left Heart Cath and Coronary Angiography;  Surgeon: Sherren Mocha, MD;  Location: [facility];  Service: Cardiovascular;  Laterality: N/A;  . LEFT HEART  CATH AND CORONARY ANGIOGRAPHY N/A 09/20/2017   Procedure: LEFT HEART CATH AND CORONARY ANGIOGRAPHY;  Surgeon: Leonie Man, MD;  Location: [facility];  Service: Cardiovascular;  Laterality: N/A;  . LEFT HEART CATH AND CORONARY ANGIOGRAPHY N/A 06/30/2018   Procedure: LEFT HEART CATH AND CORONARY ANGIOGRAPHY;  Surgeon: Nelva Bush, MD;  Location: [facility];  Service: Cardiovascular;  Laterality: N/A;  . SHOULDER ARTHROSCOPY  2012   left  . SHOULDER ARTHROSCOPY     right  . TEMPORARY PACEMAKER N/A 03/25/2017   Procedure: Temporary Pacemaker;  Surgeon: Sherren Mocha, MD;  Location: [facility];  Service: Cardiovascular;  Laterality: N/A;  . TONSILLECTOMY    . TOTAL HIP ARTHROPLASTY Right 04/25/2019  . TOTAL HIP ARTHROPLASTY Right 04/25/2019   Procedure: TOTAL HIP ARTHROPLASTY ANTERIOR APPROACH;  Surgeon: Rod Can, MD;  Location: [facility];  Service: Orthopedics;  Laterality: Right;  . TOTAL KNEE ARTHROPLASTY  2002   left    Current Outpatient Medications  Medication Sig  Dispense Refill  . albuterol (VENTOLIN HFA) 108 (90 Base) MCG/ACT inhaler Inhale 2 puffs into the lungs every 6 (six) hours as needed for wheezing or shortness of breath. 18 g 2  . amLODipine (NORVASC) 5 MG tablet Take 1 tablet (5 mg total) by mouth daily. 90 tablet 1  . aspirin 81 MG chewable tablet Chew 81 mg by mouth daily.    Marland Kitchen atorvastatin (LIPITOR) 40 MG tablet Take 1 tablet (40 mg total) by mouth daily at 6 PM. 90 tablet 1  . clonazePAM (KLONOPIN) 0.25 MG disintegrating tablet Take 1 tablet (0.25 mg total) by mouth 2 (two) times daily as needed. 60 tablet 0  . clopidogrel (PLAVIX) 75 MG tablet Take 1 tablet (75 mg total) by mouth daily. 90 tablet 1  . Coenzyme Q10 (COQ10 PO) Take 1 capsule by mouth daily.    . furosemide (LASIX) 20 MG tablet Take 1 tablet (20 mg total) by mouth daily as needed for edema. 90 tablet 1  . hydrALAZINE (APRESOLINE) 50 MG tablet Take 1 tablet (50 mg total)  by mouth every 8 (eight) hours. 270 tablet 1  . HYDROcodone-acetaminophen (NORCO) 7.5-325 MG tablet Take 1 tablet by mouth every 6 (six) hours as needed for moderate pain or severe pain. 120 tablet 0  . isosorbide mononitrate (IMDUR) 30 MG 24 hr tablet Take 0.5 tablets (15 mg total) by mouth daily. 45 tablet 1  . losartan (COZAAR) 100 MG tablet Take 1 tablet (100 mg total) by mouth daily. 90 tablet 1  . metFORMIN (GLUCOPHAGE-XR) 500 MG 24 hr tablet Take 2 tablets (1,000 mg total) by mouth every evening. 180 tablet 1  . metoprolol tartrate (LOPRESSOR) 50 MG tablet Take 1 tablet (50 mg total) by mouth 2 (two) times daily. 180 tablet 1  . nitroGLYCERIN (NITROSTAT) 0.4 MG SL tablet Place 1 tablet (0.4 mg total) under the tongue every 5 (five) minutes x 3 doses as needed for chest pain. 25 tablet 11  . Polyvinyl Alcohol (LUBRICANT DROPS OP) Place 1 drop into both eyes 3 (three) times daily as needed (allergy eyes).    . Na Sulfate-K Sulfate-Mg Sulf (SUPREP BOWEL PREP KIT) 17.5-3.13-1.6 GM/177ML SOLN Take 1 kit by mouth as directed. 354 mL 0   No current facility-administered medications for this visit.    Allergies as of 08/23/2019  . (No Known Allergies)    Family History  Problem Relation Age of Onset  . Hypertension Mother   . Hypertension Father   . Hypertension Brother   . Heart disease Maternal Grandfather   . Colon cancer Neg Hx     Social History   Socioeconomic History  . Marital status: Married    Spouse name: Not on file  . Number of children: Not on file  . Years of education: Not on file  . Highest education level: Not on file  Occupational History  . Occupation: Retired  Tobacco Use  . Smoking status: Former Smoker    Packs/day: 2.00    Years: 30.00    Pack years: 60.00    Quit date: 03/25/2017    Years since quitting: 2.4  . Smokeless tobacco: Never Used  Substance and Sexual Activity  . Alcohol use: Yes    Comment: 3-6 shots of liquor daily  . Drug use: Not  Currently    Types: Marijuana    Comment: last used about 10 years ago (08/23/19)  . Sexual activity: Not Currently  Other Topics Concern  . Not on file  Social History Narrative   Per wife, pt was preparing to retire soon from his job at [employer].   Social Determinants of Health   Financial Resource Strain:   . Difficulty of Paying Living Expenses: Not on file  Food Insecurity:   . Worried About Charity fundraiser in the Last Year: Not on file  . Ran Out of Food in the Last Year: Not on file  Transportation Needs:   . Lack of Transportation (Medical): Not on file  . Lack of Transportation (Non-Medical): Not on file  Physical Activity:   . Days of Exercise per Week: Not on file  . Minutes of Exercise per Session: Not on file  Stress:   . Feeling of Stress : Not on file  Social Connections:   . Frequency of Communication with Friends and Family: Not on file  . Frequency of Social Gatherings with Friends and Family: Not on file  . Attends Religious Services: Not on file  . Active Member of Clubs or Organizations: Not on file  . Attends Archivist Meetings: Not on file  . Marital Status: Not on file  Intimate Partner Violence:   . Fear of Current or Ex-Partner: Not on file  . Emotionally Abused: Not on file  . Physically Abused: Not on file  . Sexually Abused: Not on file    Review of Systems: Gen: Denies any fever, chills, lightheadedness, dizziness, presyncope, or syncope. HEENT: Admits to nasal congestion and post nasal drainage related to allergies. CV: Denies chest pain or heart palpitations Resp: Occasional cough. More at night. Only feels like he can't get a good breath if his abdomen is distended. Otherwise no shortness of breath.   GI: See HPI GU : Denies urinary burning, urinary frequency, urinary hesitancy MS: Diffuse joint pain.  Takes Norco for this. Derm: Denies rash Psych: Denies depression, anxiety Heme: Denies bruising, bleeding  Physical Exam:  BP (!) 144/83   Pulse 74   Temp (!) 96.9 F (36.1 C) (Oral)   Ht '5\' 9"'  (1.753 m)   Wt 209 lb 6.4 oz (95 kg)   BMI 30.92 kg/m  General:   Alert and oriented. Pleasant and cooperative. Well-nourished and well-developed.  Head:  Normocephalic and atraumatic. Eyes:  Without icterus, sclera clear and conjunctiva pink.  Ears:  Normal auditory acuity. Lungs:  Clear to auscultation bilaterally. No wheezes, rales, or rhonchi. No distress.  Heart:  S1, S2 present without murmurs appreciated.  Abdomen:  +BS.  Abdomen is protuberant but soft and nontender. No HSM noted. No guarding or rebound. No masses appreciated.  Rectal:  Deferred  Msk:  Symmetrical without gross deformities. Normal posture. Extremities:  Without edema. Neurologic:  Alert and  oriented x4;  grossly normal neurologically. Skin:  Intact without significant lesions or rashes. Psych:  Normal mood and affect.

## 2019-08-23 ENCOUNTER — Encounter: Payer: Self-pay | Admitting: Gastroenterology

## 2019-08-23 ENCOUNTER — Other Ambulatory Visit: Payer: Self-pay

## 2019-08-23 ENCOUNTER — Ambulatory Visit: Payer: 59 | Admitting: Gastroenterology

## 2019-08-23 DIAGNOSIS — D649 Anemia, unspecified: Secondary | ICD-10-CM | POA: Diagnosis not present

## 2019-08-23 DIAGNOSIS — D519 Vitamin B12 deficiency anemia, unspecified: Secondary | ICD-10-CM | POA: Insufficient documentation

## 2019-08-23 DIAGNOSIS — R140 Abdominal distension (gaseous): Secondary | ICD-10-CM | POA: Diagnosis not present

## 2019-08-23 DIAGNOSIS — Z789 Other specified health status: Secondary | ICD-10-CM | POA: Diagnosis not present

## 2019-08-23 DIAGNOSIS — Z86010 Personal history of colonic polyps: Secondary | ICD-10-CM

## 2019-08-23 DIAGNOSIS — Z860101 Personal history of adenomatous and serrated colon polyps: Secondary | ICD-10-CM

## 2019-08-23 DIAGNOSIS — R7989 Other specified abnormal findings of blood chemistry: Secondary | ICD-10-CM | POA: Diagnosis not present

## 2019-08-23 MED ORDER — SUPREP BOWEL PREP KIT 17.5-3.13-1.6 GM/177ML PO SOLN: 1.0000 | ORAL | 0 refills | Status: DC

## 2019-08-23 NOTE — Assessment & Plan Note (Signed)
63 year old male with past medical history significant for coronary artery disease, STEMI in 2018 and NSTEMI in 2019 with DES's in place, T2DM, HTN, HLD, pulmonary hypertension, sleep apnea, and chronic daily alcohol use who has had mild normocytic anemia with hemoglobin in the upper 11-12 range since October 2019.  Platelets slightly low since 2018.  Most recent hemoglobin on file was 9.3 on 04/27/19 but this was after total hip arthroplasty on 04/25/2019.  Patient denies bright red blood per rectum or melena.  No iron studies on file.  Last colonoscopy in 2011 with 1 tubular adenoma, currently due for repeat.  Suspect mild normocytic anemia and mild thrombocytopenia is likely due to to chronic alcohol use. ? Underlying cirrhosis. He is due for colonoscopy at this time which will help evaluate for any contributing colonic etiology including malignancy.  I will also go ahead and obtain an iron panel with ferritin to ensure no IDA.  If IDA present, would consider adding EGD to TCS.  We will also check celiac serologies, B 12, folate, and update CBC and CMP. US abdomen complete to evaluate liver and spleen. Follow-up after procedures.

## 2019-08-23 NOTE — Patient Instructions (Addendum)
Please have labs and ultrasound of your abdomen completed.  We will get you scheduled for your colonoscopy in the near future with Dr. Buford Dresser.  For your abdominal bloating, avoid foods that cause bloating including broccoli, cabbage, cauliflower, beans, carbonated beverages.  See handout below.  He should also try following a lactose-free diet for the next 2 weeks.  Try taking Be[place] before meals.  Continue drinking plenty of water to keep your urine pale yellow to clear.  Continue taking probiotic daily.   Regarding elevated liver enzymes: Please work towards alcohol cessation. Avoid over the counter supplements. Do not take more than 2 g tylenol daily.    We will follow up with you in the office after your procedure.  We will call you with your lab and ultrasound results.  [name] Altes, PA-C [facility]   Abdominal Bloating When you have abdominal bloating, your abdomen may feel full, tight, or painful. It may also look bigger than normal or swollen (distended). Common causes of abdominal bloating include:  Swallowing air.  Constipation.  Problems digesting food.  Eating too much.  Irritable bowel syndrome. This is a condition that affects the large intestine.  Lactose intolerance. This is an inability to digest lactose, a natural sugar in dairy products.  Celiac disease. This is a condition that affects the ability to digest gluten, a protein found in some grains.  Gastroparesis. This is a condition that slows down the movement of food in the stomach and small intestine. It is more common in people with diabetes mellitus.  Gastroesophageal reflux disease (GERD). This is a digestive condition that makes stomach acid flow back into the esophagus.  Urinary retention. This means that the body is holding onto urine, and the bladder cannot be emptied all the way. Follow these instructions at home: Eating and drinking  Avoid eating too much.  Try not to  swallow air while talking or eating.  Avoid eating while lying down.  Avoid these foods and drinks: ? Foods that cause gas, such as broccoli, cabbage, cauliflower, and baked beans. ? Carbonated drinks. ? Hard candy. ? Chewing gum. Medicines  Take over-the-counter and prescription medicines only as told by your health care provider.  Take probiotic medicines. These medicines contain live bacteria or yeasts that can help digestion.  Take coated peppermint oil capsules. Activity  Try to exercise regularly. Exercise may help to relieve bloating that is caused by gas and relieve constipation. General instructions  Keep all follow-up visits as told by your health care provider. This is important. Contact a health care provider if:  You have nausea and vomiting.  You have diarrhea.  You have abdominal pain.  You have unusual weight loss or weight gain.  You have severe pain, and medicines do not help. Get help right away if:  You have severe chest pain.  You have trouble breathing.  You have shortness of breath.  You have trouble urinating.  You have darker urine than normal.  You have blood in your stools or have dark, tarry stools. Summary  Abdominal bloating means that the abdomen is swollen.  Common causes of abdominal bloating are swallowing air, constipation, and problems digesting food.  Avoid eating too much and avoid swallowing air.  Avoid foods that cause gas, carbonated drinks, hard candy, and chewing gum. This information is not intended to replace advice given to you by your health care provider. Make sure you discuss any questions you have with your health care provider. Document Released: 10/01/2016  Document Revised: 12/18/2018 Document Reviewed: 10/01/2016 Elsevier Patient Education  [place].  Lactose-Free Diet, Adult If you have lactose intolerance, you are not able to digest lactose. Lactose is a natural sugar found mainly in dairy  milk and dairy products. You may need to avoid all foods and beverages that contain lactose. A lactose-free diet can help you do this. Which foods have lactose? Lactose is found in dairy milk and dairy products, such as:  Yogurt.  Cheese.  Butter.  Margarine.  Sour cream.  Cream.  Whipped toppings and nondairy creamers.  Ice cream and other dairy-based desserts. Lactose is also found in foods or products made with dairy milk or milk ingredients. To find out whether a food contains dairy milk or a milk ingredient, look at the ingredients list. Avoid foods with the statement "May contain milk" and foods that contain:  Milk powder.  Whey.  Curd.  Caseinate.  Lactose.  Lactalbumin.  Lactoglobulin. What are alternatives to dairy milk and foods made with milk products?  Lactose-free milk.  Soy milk with added calcium and vitamin D.  Almond milk, coconut milk, rice milk, or other nondairy milk alternatives with added calcium and vitamin D. Note that these are low in protein.  Soy products, such as soy yogurt, soy cheese, soy ice cream, and soy-based sour cream.  Other nut milk products, such as almond yogurt, almond cheese, cashew yogurt, cashew cheese, cashew ice cream, coconut yogurt, and coconut ice cream. What are tips for following this plan?  Do not consume foods, beverages, vitamins, minerals, or medicines containing lactose. Read ingredient lists carefully.  Look for the words "lactose-free" on labels.  Use lactase enzyme drops or tablets as directed by your health care provider.  Use lactose-free milk or a milk alternative, such as soy milk or almond milk, for drinking and cooking.  Make sure you get enough calcium and vitamin D in your diet. A lactose-free eating plan can be lacking in these important nutrients.  Take calcium and vitamin D supplements as directed by your health care provider. Talk to your health care provider about supplements if you are  not able to get enough calcium and vitamin D from food. What foods can I eat?  Fruits All fresh, canned, frozen, or dried fruits that are not processed with lactose. Vegetables All fresh, frozen, and canned vegetables without cheese, cream, or butter sauces. Grains Any that are not made with dairy milk or dairy products. Meats and other proteins Any meat, fish, poultry, and other protein sources that are not made with dairy milk or dairy products. Soy cheese and yogurt. Fats and oils Any that are not made with dairy milk or dairy products. Beverages Lactose-free milk. Soy, rice, or almond milk with added calcium and vitamin D. Fruit and vegetable juices. Sweets and desserts Any that are not made with dairy milk or dairy products. Seasonings and condiments Any that are not made with dairy milk or dairy products. Calcium Calcium is found in many foods that contain lactose and is important for bone health. The amount of calcium you need depends on your age:  Adults younger than 50 years: 1,000 mg of calcium a day.  Adults older than 50 years: 1,200 mg of calcium a day. If you are not getting enough calcium, you may get it from other sources, including:  Orange juice with calcium added. There are 300-350 mg of calcium in 1 cup of orange juice.  Calcium-fortified soy milk. There are 300-400 mg  of calcium in 1 cup of calcium-fortified soy milk.  Calcium-fortified rice or almond milk. There are 300 mg of calcium in 1 cup of calcium-fortified rice or almond milk.  Calcium-fortified breakfast cereals. There are 100-1,000 mg of calcium in calcium-fortified breakfast cereals.  Spinach, cooked. There are 145 mg of calcium in  cup of cooked spinach.  Edamame, cooked. There are 130 mg of calcium in  cup of cooked edamame.  Collard greens, cooked. There are 125 mg of calcium in  cup of cooked collard greens.  Kale, frozen or cooked. There are 90 mg of calcium in  cup of cooked or frozen  kale.  Almonds. There are 95 mg of calcium in  cup of almonds.  Broccoli, cooked. There are 60 mg of calcium in 1 cup of cooked broccoli. The items listed above may not be a complete list of recommended foods and beverages. Contact a dietitian for more options. What foods are not recommended? Fruits None, unless they are made with dairy milk or dairy products. Vegetables None, unless they are made with dairy milk or dairy products. Grains Any grains that are made with dairy milk or dairy products. Meats and other proteins None, unless they are made with dairy milk or dairy products. Dairy All dairy products, including milk, goat's milk, buttermilk, kefir, acidophilus milk, flavored milk, evaporated milk, condensed milk, dulce de [place], eggnog, yogurt, cheese, and cheese spreads. Fats and oils Any that are made with milk or milk products. Margarines and salad dressings that contain milk or cheese. Cream. Half and half. Cream cheese. Sour cream. Chip dips made with sour cream or yogurt. Beverages Hot chocolate. Cocoa with lactose. Instant iced teas. Powdered fruit drinks. Smoothies made with dairy milk or yogurt. Sweets and desserts Any that are made with milk or milk products. Seasonings and condiments Chewing gum that has lactose. Spice blends if they contain lactose. Artificial sweeteners that contain lactose. Nondairy creamers. The items listed above may not be a complete list of foods and beverages to avoid. Contact a dietitian for more information. Summary  If you are lactose intolerant, it means that you have a hard time digesting lactose, a natural sugar found in milk and milk products.  Following a lactose-free diet can help you manage this condition.  Calcium is important for bone health and is found in many foods that contain lactose. Talk with your health care provider about other sources of calcium. This information is not intended to replace advice given to you by your  health care provider. Make sure you discuss any questions you have with your health care provider. Document Released: 02/19/2002 Document Revised: 09/27/2017 Document Reviewed: 09/27/2017 Elsevier Patient Education  2020 [employer].

## 2019-08-23 NOTE — Assessment & Plan Note (Signed)
History of elevated LFTs.  First noted in our system in October 2019 with AST 64 and ALT 72.  Alk phos and total bilirubin normal.  Most recently in August 2020, AST normal at 40 and ALT slightly elevated at 47.  Alk phos and total bilirubin remained normal.  Also with slightly low platelets since 2018 and mild anemia since October 2019.  He does admit to continue history of daily alcohol use.  Denies history of intranasal or IV drug use.  Hepatitis C antibody in July 2020 negative.  He is without any overt signs or symptoms of advanced liver disease.  No recent imaging on file. Suspect slight elevation of LFTs, low platelets, and mild anemia are secondary to chronic alcohol use.    At this time, will update CBC, CMP, iron panel, hepatitis B and hepatitis A serologies, and obtain abdominal ultrasound. He was advised to work towards alcohol cessation. No more than 2 g of Tylenol daily. No over-the-counter supplements. Follow-up after colonoscopy.

## 2019-08-23 NOTE — Assessment & Plan Note (Addendum)
Few year history of abdominal bloating that has been worsening over the last couple of months with occasional nausea.  Bloating is worse after meals and is improved when he wakes up in the mornings.  Often improved with BMs. BMs daily, up to 3-4 times a day with varying consistency from formed 50% of the time to mushy or watery other times.  Currently taking Gas-X and Pepto-Bismol which helps some.  To start a probiotic 2 days ago. Denies bright red blood per rectum, melena, abdominal pain, or unintentional weight loss.  Currently due for repeat colonoscopy with history of tubular adenoma in 2011.  Differentials include bloating secondary to dietary intake, celiac disease, SIBO, and pancreatic insufficiency with history of diabetes. We will start with dietary adjustments and evaluating for celiac disease.  IgA, TTG IgA Avoid foods that cause bloating including broccoli, cabbage, cauliflower, beans, carbonated beverages.  Handout provided.   Trial lactose-free diet for the next 2 weeks. Try taking Beano before meals. Continue drinking plenty of water to keep your urine pale yellow to clear. Continue taking probiotic daily.  Proceed with TCS with propofol with Dr. Gala Romney for colon cancer screening. The risks, benefits, and alternatives have been discussed in detail with patient. They have stated understanding and desire to proceed.  Follow-up after colonoscopy.

## 2019-08-23 NOTE — Patient Instructions (Signed)
PA for TCS submitted via [facility] website. Case approved. PA# [id number], valid 11/22/19-02/20/20.

## 2019-08-23 NOTE — Assessment & Plan Note (Addendum)
63 year old male with past medical history significant for coronary artery disease, STEMI in 2018 and NSTEMI in 2019 with DES's in place maintained on aspirin and Plavix, T2DM, HTN, HLD, pulmonary hypertension, and sleep apnea presenting to schedule his surveillance colonoscopy.  Last colonoscopy in August 2011 with single tubular adenoma from ascending colon, hyperplastic sigmoid polyp, and hyperplastic rectal polyps.  He was recommended to repeat colonoscopy in 7 years.  Denies any family history of colon cancer, rectal bleeding, melena, abdominal pain or unintentional weight loss.  He does admit to few year history of abdominal bloating that is worsening over the last couple of months as discussed above.  1-4 BMs daily.  Consistency fluctuates from formed stools about 50% of the time to mushy or watery stools. Mild normocytic anemia with hemoglobin in the upper 11-12 range since October 2019.  Also a slightly low platelets since then.  Admits to chronic daily alcohol use for the last 10 years which may be contributing to his anemia.   Plans to update labs and check celiac serologies for evaluation of bloating and anemia.  Patient needs updated colonoscopy at this time.  May consider EGD if patient has IDA, although with normocytic indices, I suspect his iron will be normal.  Proceed with TCS with propofol with Dr. Gala Romney in the near future. The risks, benefits, and alternatives have been discussed in detail with patient. They have stated understanding and desire to proceed.  Update CBC and CMP. Celiac serologies. Discussed dietary changes including avoiding broccoli, cabbage, cauliflower, baked beans, and carbonated beverages for bloating.  Handout provided. Trial lactose-free diet. Trial Beano before meals. Follow-up after colonoscopy.

## 2019-08-24 ENCOUNTER — Encounter: Payer: Self-pay | Admitting: *Deleted

## 2019-08-24 LAB — COMPLETE METABOLIC PANEL WITH GFR
AG Ratio: 1.9 (calc) (ref 1.0–2.5)
ALT: 22 U/L (ref 9–46)
AST: 26 U/L (ref 10–35)
Albumin: 3.9 g/dL (ref 3.6–5.1)
Alkaline phosphatase (APISO): 61 U/L (ref 35–144)
BUN: 14 mg/dL (ref 7–25)
CO2: 28 mmol/L (ref 20–32)
Calcium: 8.3 mg/dL — ABNORMAL LOW (ref 8.6–10.3)
Chloride: 101 mmol/L (ref 98–110)
Creat: 1.13 mg/dL (ref 0.70–1.25)
GFR, Est African American: 79 mL/min/{1.73_m2} (ref >=60)
GFR, Est Non African American: 68 mL/min/{1.73_m2} (ref >=60)
Globulin: 2.1 g/dL (calc) (ref 1.9–3.7)
Glucose, Bld: 180 mg/dL — ABNORMAL HIGH (ref 65–139)
Potassium: 3.6 mmol/L (ref 3.5–5.3)
Sodium: 140 mmol/L (ref 135–146)
Total Bilirubin: 0.7 mg/dL (ref 0.2–1.2)
Total Protein: 6 g/dL — ABNORMAL LOW (ref 6.1–8.1)

## 2019-08-24 LAB — CBC WITH DIFFERENTIAL/PLATELET
Absolute Monocytes: 392 cells/uL (ref 200–950)
Basophils Absolute: 19 cells/uL (ref 0–200)
Basophils Relative: 0.5 %
Eosinophils Absolute: 41 cells/uL (ref 15–500)
Eosinophils Relative: 1.1 %
HCT: 32.1 % — ABNORMAL LOW (ref 38.5–50.0)
Hemoglobin: 10.9 g/dL — ABNORMAL LOW (ref 13.2–17.1)
Lymphs Abs: 1032 cells/uL (ref 850–3900)
MCH: 30.1 pg (ref 27.0–33.0)
MCHC: 34 g/dL (ref 32.0–36.0)
MCV: 88.7 fL (ref 80.0–100.0)
MPV: 11.3 fL (ref 7.5–12.5)
Monocytes Relative: 10.6 %
Neutro Abs: 2216 cells/uL (ref 1500–7800)
Neutrophils Relative %: 59.9 %
Platelets: 117 10*3/uL — ABNORMAL LOW (ref 140–400)
RBC: 3.62 10*6/uL — ABNORMAL LOW (ref 4.20–5.80)
RDW: 13.9 % (ref 11.0–15.0)
Total Lymphocyte: 27.9 %
WBC: 3.7 10*3/uL — ABNORMAL LOW (ref 3.8–10.8)

## 2019-08-24 LAB — HEPATITIS B SURFACE ANTIGEN: Hepatitis B Surface Ag: NONREACTIVE

## 2019-08-24 LAB — IRON,TIBC AND FERRITIN PANEL
%SAT: 25 % (calc) (ref 20–48)
Ferritin: 197 ng/mL (ref 24–380)
Iron: 70 ug/dL (ref 50–180)
TIBC: 276 mcg/dL (calc) (ref 250–425)

## 2019-08-24 LAB — HEPATITIS A ANTIBODY, TOTAL: Hepatitis A AB,Total: NONREACTIVE

## 2019-08-24 LAB — HEPATITIS B SURFACE ANTIBODY,QUALITATIVE: Hep B S Ab: BORDERLINE — AB

## 2019-08-24 LAB — B12 AND FOLATE PANEL
Folate: 6 ng/mL
Vitamin B-12: 251 pg/mL (ref 200–1100)

## 2019-08-24 LAB — TISSUE TRANSGLUTAMINASE, IGA: (tTG) Ab, IgA: 1 U/mL

## 2019-08-24 LAB — HEPATITIS B CORE ANTIBODY, TOTAL: Hep B Core Total Ab: NONREACTIVE

## 2019-08-24 LAB — IGA: Immunoglobulin A: 103 mg/dL (ref 70–320)

## 2019-08-26 NOTE — Progress Notes (Signed)
Hemoglobin remains low at 10.9 but has improved from 9.3, 4 months ago (which was after hip surgery). Platelets low at 117, was 127, 4 months ago (influenced by daily alcohol use). WBC slightly low at 3.7. This is nonspecific. Iron panel is normal. B12 and folate are normal, but on the low end (likely influenced by daily alcohol use). No celiac disease. LFTs have returned to normal. No Hep B infection. He has borderline immunity to Hep B. No Hep A infection. No immunity to Hep A.   He should follow-up with PCP on his B12 and folate levels. Work towards alcohol cessation as discussed at office visit. He needs vaccination against Hep A and Hep B needs to be updated as well as his immunity is borderline.   Waiting on [place].

## 2019-08-28 ENCOUNTER — Ambulatory Visit (HOSPITAL_COMMUNITY)
Admission: RE | Admit: 2019-08-28 | Discharge: 2019-08-28 | Disposition: A | Discharge status: Home / Self Care | Payer: 59 | Source: Ambulatory Visit | Attending: Gastroenterology | Admitting: Gastroenterology

## 2019-08-28 ENCOUNTER — Other Ambulatory Visit: Payer: Self-pay

## 2019-08-28 DIAGNOSIS — R7989 Other specified abnormal findings of blood chemistry: Secondary | ICD-10-CM | POA: Insufficient documentation

## 2019-08-28 NOTE — Progress Notes (Signed)
Abdominal US with likely fatty liver. Remainder of the exam unremarkable.   Fatty liver likely related to being over weight; however, his daily alcohol use is playing a large role here as well. If is very important he work on diet, exercise, weight loss, and alcohol cessation.   Instructions for fatty liver: Recommend 1-2# weight loss per week until ideal body weight through exercise & diet. Low fat/cholesterol diet.  Avoid sweets, sodas, fruit juices, sweetened beverages like tea, etc. Gradually increase exercise from 15 min daily up to 1 hr per day 5 days/week. Limit alcohol use.

## 2019-09-18 ENCOUNTER — Other Ambulatory Visit: Payer: Self-pay | Admitting: Physician Assistant

## 2019-09-18 DIAGNOSIS — I10 Essential (primary) hypertension: Secondary | ICD-10-CM

## 2019-09-18 DIAGNOSIS — I2510 Atherosclerotic heart disease of native coronary artery without angina pectoris: Secondary | ICD-10-CM

## 2019-09-19 MED ORDER — LOSARTAN POTASSIUM 100 MG PO TABS: 100.0000 mg | ORAL_TABLET | Freq: Every day | ORAL | 2 refills | Status: DC

## 2019-10-10 ENCOUNTER — Other Ambulatory Visit: Payer: Self-pay | Admitting: Family Medicine

## 2019-10-10 DIAGNOSIS — I10 Essential (primary) hypertension: Secondary | ICD-10-CM

## 2019-10-10 DIAGNOSIS — I2510 Atherosclerotic heart disease of native coronary artery without angina pectoris: Secondary | ICD-10-CM

## 2019-10-22 ENCOUNTER — Ambulatory Visit: Payer: 59 | Admitting: Family Medicine

## 2019-10-26 ENCOUNTER — Other Ambulatory Visit: Payer: Self-pay | Admitting: Family Medicine

## 2019-10-26 DIAGNOSIS — E118 Type 2 diabetes mellitus with unspecified complications: Secondary | ICD-10-CM

## 2019-10-29 ENCOUNTER — Other Ambulatory Visit: Payer: Self-pay

## 2019-10-30 ENCOUNTER — Encounter: Payer: Self-pay | Admitting: Family Medicine

## 2019-10-30 ENCOUNTER — Ambulatory Visit: Payer: 59 | Admitting: Family Medicine

## 2019-10-30 VITALS — BP 166/83 | HR 76 | Temp 99.5°F | Ht 69.0 in | Wt 210.2 lb

## 2019-10-30 DIAGNOSIS — Z6379 Other stressful life events affecting family and household: Secondary | ICD-10-CM | POA: Diagnosis not present

## 2019-10-30 DIAGNOSIS — Z79899 Other long term (current) drug therapy: Secondary | ICD-10-CM | POA: Diagnosis not present

## 2019-10-30 DIAGNOSIS — E1129 Type 2 diabetes mellitus with other diabetic kidney complication: Secondary | ICD-10-CM | POA: Diagnosis not present

## 2019-10-30 DIAGNOSIS — E785 Hyperlipidemia, unspecified: Secondary | ICD-10-CM

## 2019-10-30 DIAGNOSIS — G894 Chronic pain syndrome: Secondary | ICD-10-CM | POA: Diagnosis not present

## 2019-10-30 DIAGNOSIS — Z0184 Encounter for antibody response examination: Secondary | ICD-10-CM

## 2019-10-30 DIAGNOSIS — Z23 Encounter for immunization: Secondary | ICD-10-CM

## 2019-10-30 DIAGNOSIS — R809 Proteinuria, unspecified: Secondary | ICD-10-CM

## 2019-10-30 DIAGNOSIS — R600 Localized edema: Secondary | ICD-10-CM

## 2019-10-30 DIAGNOSIS — I10 Essential (primary) hypertension: Secondary | ICD-10-CM

## 2019-10-30 DIAGNOSIS — I2510 Atherosclerotic heart disease of native coronary artery without angina pectoris: Secondary | ICD-10-CM

## 2019-10-30 LAB — LIPID PANEL
Chol/HDL Ratio: 2.7 ratio (ref 0.0–5.0)
Cholesterol, Total: 131 mg/dL (ref 100–199)
HDL: 48 mg/dL (ref >39)
LDL Chol Calc (NIH): 58 mg/dL (ref 0–99)
Triglycerides: 142 mg/dL (ref 0–149)
VLDL Cholesterol Cal: 25 mg/dL (ref 5–40)

## 2019-10-30 LAB — BAYER DCA HB A1C WAIVED: HB A1C (BAYER DCA - WAIVED): 6.3 % (ref <7.0)

## 2019-10-30 MED ORDER — ATORVASTATIN CALCIUM 40 MG PO TABS: 40.0000 mg | ORAL_TABLET | Freq: Every day | ORAL | 2 refills | Status: DC

## 2019-10-30 MED ORDER — HYDRALAZINE HCL 50 MG PO TABS: 50.0000 mg | ORAL_TABLET | Freq: Three times a day (TID) | ORAL | 2 refills | Status: DC

## 2019-10-30 MED ORDER — AMLODIPINE BESYLATE 5 MG PO TABS: 5.0000 mg | ORAL_TABLET | Freq: Every day | ORAL | 2 refills | Status: DC

## 2019-10-30 MED ORDER — ISOSORBIDE MONONITRATE ER 30 MG PO TB24: 15.0000 mg | ORAL_TABLET | Freq: Every day | ORAL | 2 refills | Status: DC

## 2019-10-30 MED ORDER — HYDROCODONE-ACETAMINOPHEN 7.5-325 MG PO TABS: 1.0000 | ORAL_TABLET | Freq: Four times a day (QID) | ORAL | 0 refills | Status: DC | PRN

## 2019-10-30 MED ORDER — METFORMIN HCL ER 500 MG PO TB24: 1000.0000 mg | ORAL_TABLET | Freq: Every evening | ORAL | 2 refills | Status: DC

## 2019-10-30 MED ORDER — CLOPIDOGREL BISULFATE 75 MG PO TABS: 75.0000 mg | ORAL_TABLET | Freq: Every day | ORAL | 2 refills | Status: DC

## 2019-10-30 MED ORDER — FUROSEMIDE 20 MG PO TABS: 20.0000 mg | ORAL_TABLET | Freq: Every day | ORAL | 2 refills | Status: DC | PRN

## 2019-10-30 MED ORDER — METOPROLOL TARTRATE 50 MG PO TABS: 50.0000 mg | ORAL_TABLET | Freq: Two times a day (BID) | ORAL | 2 refills | Status: DC

## 2019-10-30 MED ORDER — LOSARTAN POTASSIUM 100 MG PO TABS: 100.0000 mg | ORAL_TABLET | Freq: Every day | ORAL | 2 refills | Status: DC

## 2019-10-30 NOTE — Patient Instructions (Addendum)
Due for diabetic eye exam on/after 11/20/2019  Edema  Edema is when you have too much fluid in your body or under your skin. Edema may make your legs, feet, and ankles swell up. Swelling is also common in looser tissues, like around your eyes. This is a common condition. It gets more common as you get older. There are many possible causes of edema. Eating too much salt (sodium) and being on your feet or sitting for a long time can cause edema in your legs, feet, and ankles. Hot weather may make edema worse. Edema is usually painless. Your skin may look swollen or shiny. Follow these instructions at home:  Keep the swollen body part raised (elevated) above the level of your heart when you are sitting or lying down.  Do not sit still or stand for a long time.  Do not wear tight clothes. Do not wear garters on your upper legs.  Exercise your legs. This can help the swelling go down.  Wear elastic bandages or support stockings as told by your doctor.  Eat a low-salt (low-sodium) diet to reduce fluid as told by your doctor.  Depending on the cause of your swelling, you may need to limit how much fluid you drink (fluid restriction).  Take over-the-counter and prescription medicines only as told by your doctor. Contact a doctor if:  Treatment is not working.  You have heart, liver, or kidney disease and have symptoms of edema.  You have sudden and unexplained weight gain. Get help right away if:  You have shortness of breath or chest pain.  You cannot breathe when you lie down.  You have pain, redness, or warmth in the swollen areas.  You have heart, liver, or kidney disease and get edema all of a sudden.  You have a fever and your symptoms get worse all of a sudden. Summary  Edema is when you have too much fluid in your body or under your skin.  Edema may make your legs, feet, and ankles swell up. Swelling is also common in looser tissues, like around your eyes.  Raise  (elevate) the swollen body part above the level of your heart when you are sitting or lying down.  Follow your doctor's instructions about diet and how much fluid you can drink (fluid restriction). This information is not intended to replace advice given to you by your health care provider. Make sure you discuss any questions you have with your health care provider. Document Revised: 09/02/2017 Document Reviewed: 09/17/2016 Elsevier Patient Education  2020 [employer].

## 2019-10-30 NOTE — Progress Notes (Addendum)
Assessment & Plan:  1. Type 2 diabetes mellitus without complication, without long-term current use of insulin (HCC) Lab Results  Component Value Date   HGBA1C 6.3 10/30/2019   HGBA1C 6.9 (H) 04/25/2019   HGBA1C 7.0 (H) 04/11/2019  - Diabetes is at goal of A1c < 7. - Medications: continue current medications - Home glucose monitoring: continue as needed - Patient is currently taking a statin. Patient is taking an ACE-inhibitor/ARB.  - Last foot exam: 04/11/2019 - Last diabetic eye exam: 11/20/2018 - Urine Microalbumin/Creat Ratio: 07/19/2019 - Instruction/counseling given: reminded to get eye exam - Bayer DCA Hb A1c Waived - metFORMIN (GLUCOPHAGE-XR) 500 MG 24 hr tablet; Take 2 tablets (1,000 mg total) by mouth every evening.  Dispense: 180 tablet; Refill: 2  2-3. Chronic pain syndrome/Controlled substance agreement signed - Well controlled on current regimen.  - HYDROcodone-acetaminophen (NORCO) 7.5-325 MG tablet; Take 1 tablet by mouth every 6 (six) hours as needed for moderate pain or severe pain.  Dispense: 120 tablet; Refill: 0 - HYDROcodone-acetaminophen (NORCO) 7.5-325 MG tablet; Take 1 tablet by mouth every 6 (six) hours as needed for moderate pain or severe pain.  Dispense: 120 tablet; Refill: 0 - HYDROcodone-acetaminophen (NORCO) 7.5-325 MG tablet; Take 1 tablet by mouth every 6 (six) hours as needed for moderate pain or severe pain.  Dispense: 120 tablet; Refill: 0  4. Stress due to illness of family member - Doing well currently.   5. Bilateral lower extremity edema - Education provided on edema.  Encourage patient to elevate lower extremities while sitting, decrease salt, continue compression hose, and get up moving around as often as possible.  6. Dyslipidemia, goal LDL below 70 - Well controlled on current regimen.  - atorvastatin (LIPITOR) 40 MG tablet; Take 1 tablet (40 mg total) by mouth daily at 6 PM.  Dispense: 90 tablet; Refill: 2 - Lipid panel  7-8. 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    Return in about 3 months (around 01/27/2020) for pain contract .  [name], MSN, APRN, FNP-C [facility]  Subjective:    Patient ID: [name] Foster, male    DOB: [birth year], 64 y.o.   MRN: [id number]  Patient Care Team: [name] Brooklyn, FNP as PCP - General (Family Medicine) [name] Mocha, MD as Consulting Physician (Cardiology) [facility] as Consulting Physician (Orthopedic Surgery)   Chief Complaint:  Chief Complaint  Patient presents with  . Diabetes    3 month follow up    HPI: [name] Foster is a 64 y.o. male presenting on 10/30/2019 for Diabetes (3 month follow up)  Diabetes: Patient presents for follow up of diabetes. Current symptoms include: none. Known diabetic complications: none. Medication compliance: yes. Current diet: in general, a "healthy" diet  . Current exercise: bicycling and walking. Home blood sugar records: BGs range between 118 and 180. Is he  on ACE inhibitor or angiotensin II receptor blocker? Yes. Is he on a statin? Yes.   Lab Results  Component Value Date   HGBA1C 6.3 10/30/2019   HGBA1C 6.9 (H) 04/25/2019   HGBA1C 7.0 (H) 04/11/2019   Lab Results  Component Value Date   LDLCALC 55 09/29/2018   CREATININE 1.13 08/23/2019     Pain assessment: Cause of pain- severe arthritis Pain location- back Pain on scale of 1-10- 7/10 at the worst; 3/10 with medication Frequency- daily  What increases pain- movement, ADLs, awkward positions What makes pain better- rest, pain medication, heat, position changes Effects on ADL - getting dressed Any change in general medical condition- no  Current opioids rx- Norco 7.5/325 Q6H PRN. Patient takes between one and three a day, depending on the day and what is going on.  # meds rx- 120 Effectiveness of current meds-  works well Adverse reactions form pain meds- none Morphine equivalent- 30 MME/day  Pill count performed-No Last drug screen - 04/18/2019 ( high risk q13m moderate risk q674mlow risk yearly ) Urine drug screen today- No Was the [place]- yes  If yes were their any concerning findings? - no  Pain contract signed on: 04/18/2019  Stress: Patient was previously given a prescription for clonazepam when his wife was diagnosed with lung cancer.  He reports he is actually only taking it 3 times so he has plenty at home.  His wife is doing well and tolerated her first round of treatment.  She has a follow-up appointment on Thursday.   New complaints: Patient reports he is experiencing some swelling in bilateral lower extremities with the right being worse than the left.  He does wear compression hose that are knee-high.  He has been trying to exercise more but admits that he did just get home from vacation and did not do much besides that on the peer and fish while he was on vacation.  Also admits that he eats more salt than he should.   Social history:  Relevant past medical, surgical, family and social history reviewed and updated as indicated. Interim medical history since our last visit reviewed.  Allergies and medications reviewed and updated.  DATA REVIEWED: CHART IN EPIC  ROS: Negative unless specifically indicated above in HPI.    Current Outpatient Medications:  .  albuterol (VENTOLIN HFA) 108 (90 Base) MCG/ACT inhaler, Inhale 2 puffs into the lungs every 6 (six) hours as needed for wheezing or shortness of breath., Disp: 18 g, Rfl: 2 .  amLODipine (NORVASC) 5 MG tablet, Take 1 tablet (5 mg total) by mouth daily., Disp: 90 tablet, Rfl: 2 .  aspirin 81 MG chewable tablet, Chew 81 mg by mouth daily., Disp: , Rfl:  .  atorvastatin (LIPITOR) 40 MG tablet, Take 1 tablet (40 mg total) by mouth daily at 6 PM., Disp: 90 tablet, Rfl: 2 .  clonazePAM (KLONOPIN) 0.25 MG disintegrating  tablet, Take 1 tablet (0.25 mg total) by mouth 2 (two) times daily as needed., Disp: 60 tablet, Rfl: 0 .  clopidogrel (PLAVIX) 75 MG tablet, Take 1 tablet (75 mg total) by mouth daily., Disp: 90 tablet, Rfl: 2 .  Coenzyme Q10 (COQ10 PO), Take 1 capsule by mouth daily., Disp: , Rfl:  .  furosemide (LASIX) 20 MG tablet, Take 1 tablet (20 mg total) by mouth daily as needed for edema., Disp: 90 tablet, Rfl: 2 .  hydrALAZINE (APRESOLINE) 50 MG tablet, Take 1 tablet (50 mg total) by mouth every 8 (eight) hours., Disp: 270 tablet, Rfl: 2 .  [START ON 12/26/2019] HYDROcodone-acetaminophen (NORCO) 7.5-325 MG tablet, Take 1 tablet by mouth every 6 (six) hours as needed for moderate pain or severe pain., Disp: 120 tablet, Rfl: 0 .  [START ON 11/26/2019] HYDROcodone-acetaminophen (NORCO) 7.5-325 MG tablet, Take 1 tablet by mouth every 6 (six) hours as needed for moderate pain or severe pain., Disp: 120 tablet, Rfl: 0 .  HYDROcodone-acetaminophen (NORCO) 7.5-325 MG tablet, Take 1 tablet by mouth every 6 (six)  hours as needed for moderate pain or severe pain., Disp: 120 tablet, Rfl: 0 .  isosorbide mononitrate (IMDUR) 30 MG 24 hr tablet, Take 0.5 tablets (15 mg total) by mouth daily., Disp: 45 tablet, Rfl: 2 .  losartan (COZAAR) 100 MG tablet, Take 1 tablet (100 mg total) by mouth daily., Disp: 90 tablet, Rfl: 2 .  metFORMIN (GLUCOPHAGE-XR) 500 MG 24 hr tablet, Take 2 tablets (1,000 mg total) by mouth every evening., Disp: 180 tablet, Rfl: 2 .  metoprolol tartrate (LOPRESSOR) 50 MG tablet, Take 1 tablet (50 mg total) by mouth 2 (two) times daily., Disp: 180 tablet, Rfl: 2 .  nitroGLYCERIN (NITROSTAT) 0.4 MG SL tablet, Place 1 tablet (0.4 mg total) under the tongue every 5 (five) minutes x 3 doses as needed for chest pain., Disp: 25 tablet, Rfl: 11 .  Polyvinyl Alcohol (LUBRICANT DROPS OP), Place 1 drop into both eyes 3 (three) times daily as needed (allergy eyes)., Disp: , Rfl:  .  Na Sulfate-K Sulfate-Mg Sulf (SUPREP  BOWEL PREP KIT) 17.5-3.13-1.6 GM/177ML SOLN, Take 1 kit by mouth as directed. (Patient not taking: Reported on 10/30/2019), Disp: 354 mL, Rfl: 0   No Known Allergies Past Medical History:  Diagnosis Date  . CAD (coronary artery disease)    a. 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 DES to left PDA 09/20/17 // c. 10/19: LCx and LPDA stents ok; s/p DES to pLAD and POBA to D1  . Diabetes mellitus without complication ([place])    type 2   . Heart attack ([place])    " a blood clot is actually what caused my heart attack in 2018 " , he states he is unsure where the blood clot originated from   . Hyperlipidemia   . Hypertension   . Insomnia   . Pulmonary hypertension ([place])    a. TTE 03/25/17; EF 60-65%, nl WM, LV diastolic fxn nl, trivial AI, RV cavity size, wall thickness, and sys fxn nl, trivial TR, PASP 39 mmHg, trivial pericardial effusion  . Sleep apnea    could not use a cpap  . Wears dentures    full top-partial bottom  . Wears glasses     Past Surgical History:  Procedure Laterality Date  . COLONOSCOPY  05/13/2010   Single anal papilla, 2 diminutive polyps at 10 cm, left-sided diverticula, one sigmoid and one a sending polyp, otherwise normal colon and TI.  Path with 1 tubular adenoma, otherwise hyperplastic polyps.  Recommendations repeat colonoscopy in 7 years.  . CORONARY ATHERECTOMY N/A 07/03/2018   Procedure: CORONARY ATHERECTOMY;  Surgeon: Burnell Blanks, MD;  Location: [place] [facility];  Service: Cardiovascular;  Laterality: N/A;  . CORONARY STENT INTERVENTION N/A 09/20/2017   Procedure: CORONARY STENT INTERVENTION;  Surgeon: Leonie Man, MD;  Location: [place] [facility];  Service: Cardiovascular;  Laterality: N/A;  . CORONARY STENT INTERVENTION N/A 07/03/2018   Procedure: CORONARY STENT INTERVENTION;  Surgeon: Burnell Blanks, MD;   Location: [place] [facility];  Service: Cardiovascular;  Laterality: N/A;  . CORONARY/GRAFT ACUTE MI REVASCULARIZATION N/A 03/25/2017   Procedure: Coronary/Graft Acute MI Revascularization;  Surgeon: [name] Mocha, MD;  Location: [place] [facility];  Service: Cardiovascular;  Laterality: N/A;  . GANGLION CYST EXCISION Left 07/25/2014   Procedure: EXCISION VOLAR AND DORSAL GANGLION LEFT WRIST;  Surgeon: Leanora Cover, MD;  Location:  [place];  Service: Orthopedics;  Laterality: Left;  . INTRAVASCULAR PRESSURE WIRE/FFR STUDY N/A 09/20/2017   Procedure: INTRAVASCULAR PRESSURE WIRE/FFR STUDY;  Surgeon: Leonie Man, MD;  Location: [place] [facility];  Service: Cardiovascular;  Laterality: N/A;  . INTRAVASCULAR PRESSURE WIRE/FFR STUDY N/A 06/30/2018   Procedure: INTRAVASCULAR PRESSURE WIRE/FFR STUDY;  Surgeon: Nelva Bush, MD;  Location: [place] [facility];  Service: Cardiovascular;  Laterality: N/A;  . KNEE ARTHROSCOPY     rifgr and left  . LEFT HEART CATH AND CORONARY ANGIOGRAPHY N/A 03/25/2017   Procedure: Left Heart Cath and Coronary Angiography;  Surgeon: [name] Mocha, MD;  Location: [place] [facility];  Service: Cardiovascular;  Laterality: N/A;  . LEFT HEART CATH AND CORONARY ANGIOGRAPHY N/A 09/20/2017   Procedure: LEFT HEART CATH AND CORONARY ANGIOGRAPHY;  Surgeon: Leonie Man, MD;  Location: [place] [facility];  Service: Cardiovascular;  Laterality: N/A;  . LEFT HEART CATH AND CORONARY ANGIOGRAPHY N/A 06/30/2018   Procedure: LEFT HEART CATH AND CORONARY ANGIOGRAPHY;  Surgeon: Nelva Bush, MD;  Location: [place] [facility];  Service: Cardiovascular;  Laterality: N/A;  . SHOULDER ARTHROSCOPY  2012   left  . SHOULDER ARTHROSCOPY     right  . TEMPORARY PACEMAKER N/A 03/25/2017   Procedure: Temporary Pacemaker;  Surgeon: [name] Mocha, MD;  Location: [place] [facility];  Service: Cardiovascular;  Laterality: N/A;  . TONSILLECTOMY    . TOTAL HIP ARTHROPLASTY  Right 04/25/2019  . TOTAL HIP ARTHROPLASTY Right 04/25/2019   Procedure: TOTAL HIP ARTHROPLASTY ANTERIOR APPROACH;  Surgeon: Rod Can, MD;  Location: [facility];  Service: Orthopedics;  Laterality: Right;  . TOTAL KNEE ARTHROPLASTY  2002   left    Social History   Socioeconomic History  . Marital status: Married    Spouse name: Not on file  . Number of children: Not on file  . Years of education: Not on file  . Highest education level: Not on file  Occupational History  . Occupation: Retired  Tobacco Use  . Smoking status: Former Smoker    Packs/day: 2.00    Years: 30.00    Pack years: 60.00    Quit date: 03/25/2017    Years since quitting: 2.6  . Smokeless tobacco: Never Used  Substance and Sexual Activity  . Alcohol use: Yes    Comment: 3-6 shots of liquor daily  . Drug use: Not Currently    Types: Marijuana    Comment: last used about 10 years ago (08/23/19)  . Sexual activity: Not Currently  Other Topics Concern  . Not on file  Social History Narrative   Per wife, pt was preparing to retire soon from his job at [employer].   Social Determinants of Health   Financial Resource Strain:   . Difficulty of Paying Living Expenses: Not on file  Food Insecurity:   . Worried About Charity fundraiser in the Last Year: Not on file  . Ran Out of Food in the Last Year: Not on file  Transportation Needs:   . Lack of Transportation (Medical): Not on file  . Lack of Transportation (Non-Medical): Not on file  Physical Activity:   . Days of Exercise per Week: Not on file  . Minutes of Exercise per Session: Not on file  Stress:   . Feeling of Stress : Not on file  Social Connections:   . Frequency of Communication with Friends and Family: Not on file  . Frequency of Social Gatherings  with Friends and Family: Not on file  . Attends Religious Services: Not on file  . Active Member of Clubs or Organizations: Not on file  . Attends Archivist Meetings: Not on file  . Marital  Status: Not on file  Intimate Partner Violence:   . Fear of Current or Ex-Partner: Not on file  . Emotionally Abused: Not on file  . Physically Abused: Not on file  . Sexually Abused: Not on file        Objective:    BP (!) 166/83   Pulse 76   Temp 99.5 F (37.5 C) (Temporal)   Ht '5\' 9"'  (1.753 m)   Wt 210 lb 3.2 oz (95.3 kg)   SpO2 94%   BMI 31.04 kg/m   Physical Exam Vitals reviewed.  Constitutional:      General: He is not in acute distress.    Appearance: Normal appearance. He is obese. He is not ill-appearing, toxic-appearing or diaphoretic.  HENT:     Head: Normocephalic and atraumatic.  Eyes:     General: No scleral icterus.       Right eye: No discharge.        Left eye: No discharge.     Conjunctiva/sclera: Conjunctivae normal.  Cardiovascular:     Rate and Rhythm: Normal rate and regular rhythm.     Heart sounds: Normal heart sounds. No murmur. No friction rub. No gallop.   Pulmonary:     Effort: Pulmonary effort is normal. No respiratory distress.     Breath sounds: Normal breath sounds. No stridor. No wheezing, rhonchi or rales.  Musculoskeletal:        General: Normal range of motion.     Cervical back: Normal range of motion.     Right lower leg: 1+ Edema present.     Left lower leg: 1+ Edema present.  Skin:    General: Skin is warm and dry.  Neurological:     Mental Status: He is alert and oriented to person, place, and time. Mental status is at baseline.  Psychiatric:        Mood and Affect: Mood normal.        Behavior: Behavior normal.        Thought Content: Thought content normal.        Judgment: Judgment normal.    No results found for: TSH Lab Results  Component Value Date   WBC 3.7 (L) 08/23/2019   HGB 10.9 (L) 08/23/2019   HCT 32.1 (L) 08/23/2019   MCV 88.7 08/23/2019   PLT 117 (L) 08/23/2019   Lab Results  Component Value Date   NA 140 08/23/2019   K 3.6 08/23/2019   CO2 28 08/23/2019   GLUCOSE 180 (H) 08/23/2019   BUN 14  08/23/2019   CREATININE 1.13 08/23/2019   BILITOT 0.7 08/23/2019   ALKPHOS 54 04/19/2019   AST 26 08/23/2019   ALT 22 08/23/2019   PROT 6.0 (L) 08/23/2019   ALBUMIN 4.1 04/19/2019   CALCIUM 8.3 (L) 08/23/2019   ANIONGAP 7 04/27/2019   Lab Results  Component Value Date   CHOL 118 09/29/2018   Lab Results  Component Value Date   HDL 45 09/29/2018   Lab Results  Component Value Date   LDLCALC 55 09/29/2018   Lab Results  Component Value Date   TRIG 90 09/29/2018   Lab Results  Component Value Date   CHOLHDL 2.6 09/29/2018   Lab Results  Component Value Date  HGBA1C 6.3 10/30/2019

## 2019-11-19 ENCOUNTER — Other Ambulatory Visit: Payer: Self-pay

## 2019-11-19 ENCOUNTER — Encounter (HOSPITAL_COMMUNITY): Payer: Self-pay

## 2019-11-20 ENCOUNTER — Other Ambulatory Visit (HOSPITAL_COMMUNITY)
Admission: RE | Admit: 2019-11-20 | Discharge: 2019-11-20 | Disposition: A | Discharge status: Home / Self Care | Payer: 59 | Source: Ambulatory Visit | Attending: Internal Medicine | Admitting: Internal Medicine

## 2019-11-20 ENCOUNTER — Encounter (HOSPITAL_COMMUNITY)
Admission: RE | Admit: 2019-11-20 | Discharge: 2019-11-20 | Disposition: A | Discharge status: Home / Self Care | Payer: 59 | Source: Ambulatory Visit | Attending: Internal Medicine | Admitting: Internal Medicine

## 2019-11-20 DIAGNOSIS — Z01812 Encounter for preprocedural laboratory examination: Secondary | ICD-10-CM | POA: Diagnosis present

## 2019-11-20 DIAGNOSIS — Z20822 Contact with and (suspected) exposure to covid-19: Secondary | ICD-10-CM | POA: Diagnosis not present

## 2019-11-20 HISTORY — DX: Unspecified osteoarthritis, unspecified site: M19.90

## 2019-11-20 LAB — SARS CORONAVIRUS 2 (TAT 6-24 HRS): SARS Coronavirus 2: NEGATIVE

## 2019-11-22 ENCOUNTER — Ambulatory Visit (HOSPITAL_COMMUNITY)
Admission: RE | Admit: 2019-11-22 | Discharge: 2019-11-22 | Disposition: A | Discharge status: Home / Self Care | Payer: 59 | Source: Ambulatory Visit | Attending: Internal Medicine | Admitting: Internal Medicine

## 2019-11-22 ENCOUNTER — Encounter: Payer: Self-pay | Admitting: Internal Medicine

## 2019-11-22 ENCOUNTER — Ambulatory Visit (HOSPITAL_COMMUNITY): Payer: 59 | Admitting: Anesthesiology

## 2019-11-22 ENCOUNTER — Encounter (HOSPITAL_COMMUNITY): Payer: Self-pay | Admitting: Internal Medicine

## 2019-11-22 ENCOUNTER — Encounter (HOSPITAL_COMMUNITY)
Admission: RE | Disposition: A | Discharge status: Home / Self Care | Payer: Self-pay | Source: Ambulatory Visit | Attending: Internal Medicine

## 2019-11-22 DIAGNOSIS — Z955 Presence of coronary angioplasty implant and graft: Secondary | ICD-10-CM | POA: Diagnosis not present

## 2019-11-22 DIAGNOSIS — M1990 Unspecified osteoarthritis, unspecified site: Secondary | ICD-10-CM | POA: Insufficient documentation

## 2019-11-22 DIAGNOSIS — D120 Benign neoplasm of cecum: Secondary | ICD-10-CM | POA: Diagnosis not present

## 2019-11-22 DIAGNOSIS — I2510 Atherosclerotic heart disease of native coronary artery without angina pectoris: Secondary | ICD-10-CM | POA: Diagnosis not present

## 2019-11-22 DIAGNOSIS — G4730 Sleep apnea, unspecified: Secondary | ICD-10-CM | POA: Insufficient documentation

## 2019-11-22 DIAGNOSIS — E119 Type 2 diabetes mellitus without complications: Secondary | ICD-10-CM | POA: Diagnosis not present

## 2019-11-22 DIAGNOSIS — Z86010 Personal history of colonic polyps: Secondary | ICD-10-CM | POA: Diagnosis not present

## 2019-11-22 DIAGNOSIS — Z96652 Presence of left artificial knee joint: Secondary | ICD-10-CM | POA: Diagnosis not present

## 2019-11-22 DIAGNOSIS — I272 Pulmonary hypertension, unspecified: Secondary | ICD-10-CM | POA: Insufficient documentation

## 2019-11-22 DIAGNOSIS — I10 Essential (primary) hypertension: Secondary | ICD-10-CM | POA: Insufficient documentation

## 2019-11-22 DIAGNOSIS — Z7984 Long term (current) use of oral hypoglycemic drugs: Secondary | ICD-10-CM | POA: Insufficient documentation

## 2019-11-22 DIAGNOSIS — I252 Old myocardial infarction: Secondary | ICD-10-CM | POA: Insufficient documentation

## 2019-11-22 DIAGNOSIS — Z7982 Long term (current) use of aspirin: Secondary | ICD-10-CM | POA: Insufficient documentation

## 2019-11-22 DIAGNOSIS — Z79899 Other long term (current) drug therapy: Secondary | ICD-10-CM | POA: Diagnosis not present

## 2019-11-22 DIAGNOSIS — K621 Rectal polyp: Secondary | ICD-10-CM

## 2019-11-22 DIAGNOSIS — E785 Hyperlipidemia, unspecified: Secondary | ICD-10-CM | POA: Insufficient documentation

## 2019-11-22 DIAGNOSIS — Z96641 Presence of right artificial hip joint: Secondary | ICD-10-CM | POA: Diagnosis not present

## 2019-11-22 DIAGNOSIS — Z87891 Personal history of nicotine dependence: Secondary | ICD-10-CM | POA: Insufficient documentation

## 2019-11-22 DIAGNOSIS — D123 Benign neoplasm of transverse colon: Secondary | ICD-10-CM | POA: Insufficient documentation

## 2019-11-22 DIAGNOSIS — Z1211 Encounter for screening for malignant neoplasm of colon: Secondary | ICD-10-CM | POA: Diagnosis present

## 2019-11-22 DIAGNOSIS — Z7902 Long term (current) use of antithrombotics/antiplatelets: Secondary | ICD-10-CM | POA: Diagnosis not present

## 2019-11-22 DIAGNOSIS — K635 Polyp of colon: Secondary | ICD-10-CM | POA: Insufficient documentation

## 2019-11-22 HISTORY — PX: POLYPECTOMY: SHX5525

## 2019-11-22 HISTORY — PX: COLONOSCOPY WITH PROPOFOL: SHX5780

## 2019-11-22 LAB — GLUCOSE, CAPILLARY
Glucose-Capillary: 160 mg/dL — ABNORMAL HIGH (ref 70–99)
Glucose-Capillary: 135 mg/dL — ABNORMAL HIGH (ref 70–99)

## 2019-11-22 SURGERY — COLONOSCOPY WITH PROPOFOL
Anesthesia: General

## 2019-11-22 MED ORDER — LACTATED RINGERS IV SOLN
INTRAVENOUS | Status: DC
  Administered 2019-11-22: 1000 mL via INTRAVENOUS

## 2019-11-22 MED ORDER — PROPOFOL 500 MG/50ML IV EMUL
INTRAVENOUS | Status: DC | PRN
  Administered 2019-11-22: 125 ug/kg/min via INTRAVENOUS

## 2019-11-22 MED ORDER — CHLORHEXIDINE GLUCONATE CLOTH 2 % EX PADS: 6.0000 | MEDICATED_PAD | Freq: Once | CUTANEOUS | Status: DC

## 2019-11-22 MED ORDER — LIDOCAINE 2% (20 MG/ML) 5 ML SYRINGE
INTRAMUSCULAR | Status: AC
  Filled 2019-11-22: qty 15

## 2019-11-22 MED ORDER — LACTATED RINGERS IV SOLN: INTRAVENOUS | Status: DC | PRN

## 2019-11-22 MED ORDER — GLYCOPYRROLATE PF 0.2 MG/ML IJ SOSY
PREFILLED_SYRINGE | INTRAMUSCULAR | Status: AC
  Filled 2019-11-22: qty 1

## 2019-11-22 MED ORDER — PROPOFOL 10 MG/ML IV BOLUS
INTRAVENOUS | Status: AC
  Filled 2019-11-22: qty 40

## 2019-11-22 MED ORDER — PROPOFOL 10 MG/ML IV BOLUS
INTRAVENOUS | Status: DC | PRN
  Administered 2019-11-22: 20 mg via INTRAVENOUS
  Administered 2019-11-22: 40 mg via INTRAVENOUS

## 2019-11-22 NOTE — Anesthesia Postprocedure Evaluation (Signed)
Anesthesia Post Note  Patient: [name] Foster  Procedure(s) Performed: COLONOSCOPY WITH PROPOFOL (N/A ) POLYPECTOMY  Patient location during evaluation: PACU Anesthesia Type: General Level of consciousness: awake and alert Pain management: pain level controlled Vital Signs Assessment: post-procedure vital signs reviewed and stable Respiratory status: spontaneous breathing, nonlabored ventilation and respiratory function stable Cardiovascular status: stable Postop Assessment: no apparent nausea or vomiting Anesthetic complications: no     Last Vitals:  Vitals:   11/22/19 0803 11/22/19 0955  BP: (!) 158/86   Resp: 18   Temp: 36.8 C (P) 36.8 C  SpO2: 97% (P) 94%    Last Pain:  Vitals:   11/22/19 0858  PainSc: 0-No pain                 Wessley Emert

## 2019-11-22 NOTE — Transfer of Care (Signed)
Immediate Anesthesia Transfer of Care Note  Patient: [name] Foster  Procedure(s) Performed: COLONOSCOPY WITH PROPOFOL (N/A ) POLYPECTOMY  Patient Location: PACU  Anesthesia Type:General  Level of Consciousness: awake  Airway & Oxygen Therapy: Patient Spontanous Breathing  Post-op Assessment: Report given to RN and Post -op Vital signs reviewed and stable  Post vital signs: Reviewed and stable  Last Vitals:  Vitals Value Taken Time  BP 104/85 11/22/19 0955  Temp    Pulse 81 11/22/19 0958  Resp 15 11/22/19 0958  SpO2 93 % 11/22/19 0958  Vitals shown include unvalidated device data.  Last Pain:  Vitals:   11/22/19 0858  PainSc: 0-No pain      Patients Stated Pain Goal: 8 ([id number])  Complications: No apparent anesthesia complications

## 2019-11-22 NOTE — Anesthesia Preprocedure Evaluation (Signed)
Anesthesia Evaluation  Patient identified by MRN, date of birth, ID band Patient awake    Reviewed: Allergy & Precautions, H&P , NPO status , Patient's Chart, lab work & pertinent test results, reviewed documented beta blocker date and time   Airway        Dental   Pulmonary sleep apnea , former smoker,           Cardiovascular hypertension, + CAD and + Past MI       Neuro/Psych negative neurological ROS  negative psych ROS   GI/Hepatic   Endo/Other  diabetes, Poorly Controlled, Type 2  Renal/GU CRFRenal disease  negative genitourinary   Musculoskeletal   Abdominal   Peds  Hematology  (+) Blood dyscrasia, anemia ,   Anesthesia Other Findings   Reproductive/Obstetrics                             Anesthesia Physical Anesthesia Plan  ASA: III  Anesthesia Plan: General   Post-op Pain Management:    Induction:   PONV Risk Score and Plan: 1 and Propofol infusion  Airway Management Planned:   Additional Equipment:   Intra-op Plan:   Post-operative Plan:   Informed Consent: I have reviewed the patients History and Physical, chart, labs and discussed the procedure including the risks, benefits and alternatives for the proposed anesthesia with the patient or authorized representative who has indicated his/her understanding and acceptance.       Plan Discussed with: CRNA  Anesthesia Plan Comments:         Anesthesia Quick Evaluation

## 2019-11-22 NOTE — Op Note (Signed)
[facility] Patient Name: [name] Foster Procedure Date: 11/22/2019 8:41 AM MRN: [id number] Date of Birth: [birth year] Attending MD: Norvel Richards , MD CSN: [id number] Age: 64 Admit Type: Outpatient Procedure:                Colonoscopy Indications:              High risk colon cancer surveillance: Personal                            history of colonic polyps Providers:                Norvel Richards, MD, Otis Peak B. Sharon Seller, RN,                            Randa Spike, Technician Referring MD:              Medicines:                Propofol per Anesthesia Complications:            No immediate complications. Estimated Blood Loss:     Estimated blood loss was minimal. Procedure:                Pre-Anesthesia Assessment:                           - Prior to the procedure, a History and Physical                            was performed, and patient medications and                            allergies were reviewed. The patient's tolerance of                            previous anesthesia was also reviewed. The risks                            and benefits of the procedure and the sedation                            options and risks were discussed with the patient.                            All questions were answered, and informed consent                            was obtained. Prior Anticoagulants: The patient                            last took Plavix (clopidogrel) 1 day prior to the                            procedure. ASA Grade Assessment: III - A patient  with severe systemic disease. After reviewing the                            risks and benefits, the patient was deemed in                            satisfactory condition to undergo the procedure.                           After obtaining informed consent, the colonoscope                            was passed under direct vision. Throughout the   procedure, the patient's blood pressure, pulse, and                            oxygen saturations were monitored continuously. The                            [id number]) scope was introduced through                            the anus and advanced to the the cecum, identified                            by appendiceal orifice and ileocecal valve. The                            colonoscopy was performed without difficulty. The                            patient tolerated the procedure well. The quality                            of the bowel preparation was adequate. Scope In: 9:02:38 AM Scope Out: 9:47:53 AM Scope Withdrawal Time: 0 hours 40 minutes 18 seconds  Total Procedure Duration: 0 hours 45 minutes 15 seconds  Findings:      The perianal and digital rectal examinations were normal. Patient had       multiple colonic polyps.      Ten semi-pedunculated polyps were found in the rectum, sigmoid colon and       cecum. The polyps were 4 to 9 mm in size. These polyps were removed with       a cold snare. Resection and retrieval were complete. Estimated blood       loss was minimal.      The largest polyp was at the this polyp was located at the splenic       flexure. Polypectomy site was clipped x1 to ensure hemostasis.Three       pedunculated polyps were found in the splenic flexure and hepatic       flexure. The polyps were 12 to 25 mm in size. These polyps were removed       with a hot snare. Resection and retrieval were complete. Estimated blood       loss: none. Impression:               -  Ten 4 to 9 mm polyps in the rectum, in the                            sigmoid colon and in the cecum, removed with a cold                            snare. Resected and retrieved.                           - Three 12 to 25 mm polyps at the splenic flexure                            and at the hepatic flexure, removed with a hot                            snare. Resected and retrieved.  Status post                            hemostasis clips x1 Moderate Sedation:      Moderate (conscious) sedation was personally administered by an       anesthesia professional. The following parameters were monitored: oxygen       saturation, heart rate, blood pressure, respiratory rate, EKG, adequacy       of pulmonary ventilation, and response to care. Recommendation:           - Repeat colonoscopy date to be determined after                            pending pathology results are reviewed for                            surveillance.                           - Return to GI office in 3 months. Procedure Code(s):        --- Professional ---                           [id number], Colonoscopy, flexible; with removal of                            tumor(s), polyp(s), or other lesion(s) by snare                            technique Diagnosis Code(s):        --- Professional ---                           Z86.010, Personal history of colonic polyps                           K62.1, Rectal polyp                           K63.5, Polyp of colon CPT copyright 2019 American  Medical Association. All rights reserved. The codes documented in this report are preliminary and upon coder review may  be revised to meet current compliance requirements. Cristopher Estimable. Bryker Fletchall, MD Norvel Richards, MD 11/22/2019 9:56:36 AM This report has been signed electronically. Number of Addenda: 0

## 2019-11-22 NOTE — Discharge Instructions (Signed)
Colonoscopy Discharge Instructions  Read the instructions outlined below and refer to this sheet in the next few weeks. These discharge instructions provide you with general information on caring for yourself after you leave the hospital. Your doctor may also give you specific instructions. While your treatment has been planned according to the most current medical practices available, unavoidable complications occasionally occur. If you have any problems or questions after discharge, call Dr. Gala Romney at [phone]. ACTIVITY  You may resume your regular activity, but move at a slower pace for the next 24 hours.   Take frequent rest periods for the next 24 hours.   Walking will help get rid of the air and reduce the bloated feeling in your belly (abdomen).   No driving for 24 hours (because of the medicine (anesthesia) used during the test).    Do not sign any important legal documents or operate any machinery for 24 hours (because of the anesthesia used during the test).  NUTRITION  Drink plenty of fluids.   You may resume your normal diet as instructed by your doctor.   Begin with a light meal and progress to your normal diet. Heavy or fried foods are harder to digest and may make you feel sick to your stomach (nauseated).   Avoid alcoholic beverages for 24 hours or as instructed.  MEDICATIONS  You may resume your normal medications unless your doctor tells you otherwise.  WHAT YOU CAN EXPECT TODAY  Some feelings of bloating in the abdomen.   Passage of more gas than usual.   Spotting of blood in your stool or on the toilet paper.  IF YOU HAD POLYPS REMOVED DURING THE COLONOSCOPY:  No aspirin products for 7 days or as instructed.   No alcohol for 7 days or as instructed.   Eat a soft diet for the next 24 hours.  FINDING OUT THE RESULTS OF YOUR TEST Not all test results are available during your visit. If your test results are not back during the visit, make an appointment  with your caregiver to find out the results. Do not assume everything is normal if you have not heard from your caregiver or the medical facility. It is important for you to follow up on all of your test results.  SEEK IMMEDIATE MEDICAL ATTENTION IF:  You have more than a spotting of blood in your stool.   Your belly is swollen (abdominal distention).   You are nauseated or vomiting.   You have a temperature over 101.   You have abdominal pain or discomfort that is severe or gets worse throughout the day.    Colon Polyps  Polyps are tissue growths inside the body. Polyps can grow in many places, including the large intestine (colon). A polyp may be a round bump or a mushroom-shaped growth. You could have one polyp or several. Most colon polyps are noncancerous (benign). However, some colon polyps can become cancerous over time. Finding and removing the polyps early can help prevent this. What are the causes? The exact cause of colon polyps is not known. What increases the risk? You are more likely to develop this condition if you:  Have a family history of colon cancer or colon polyps.  Are older than 67 or older than 45 if you are African American.  Have inflammatory bowel disease, such as ulcerative colitis or Crohn's disease.  Have certain hereditary conditions, such as: ? Familial adenomatous polyposis. ? Lynch syndrome. ? Turcot syndrome. ? Peutz-Jeghers syndrome.  Are  overweight.  Smoke cigarettes.  Do not get enough exercise.  Drink too much alcohol.  Eat a diet that is high in fat and red meat and low in fiber.  Had childhood cancer that was treated with abdominal radiation. What are the signs or symptoms? Most polyps do not cause symptoms. If you have symptoms, they may include:  Blood coming from your rectum when having a bowel movement.  Blood in your stool. The stool may look dark red or black.  Abdominal pain.  A change in bowel habits, such as  constipation or diarrhea. How is this diagnosed? This condition is diagnosed with a colonoscopy. This is a procedure in which a lighted, flexible scope is inserted into the anus and then passed into the colon to examine the area. Polyps are sometimes found when a colonoscopy is done as part of routine cancer screening tests. How is this treated? Treatment for this condition involves removing any polyps that are found. Most polyps can be removed during a colonoscopy. Those polyps will then be tested for cancer. Additional treatment may be needed depending on the results of testing. Follow these instructions at home: Lifestyle  Maintain a healthy weight, or lose weight if recommended by your health care provider.  Exercise every day or as told by your health care provider.  Do not use any products that contain nicotine or tobacco, such as cigarettes and e-cigarettes. If you need help quitting, ask your health care provider.  If you drink alcohol, limit how much you have: ? 0-1 drink a day for women. ? 0-2 drinks a day for men.  Be aware of how much alcohol is in your drink. In the [place] one drink equals one 12 oz bottle of beer (355 mL), one 5 oz glass of wine (148 mL), or one 1 oz shot of hard liquor (44 mL). Eating and drinking   Eat foods that are high in fiber, such as fruits, vegetables, and whole grains.  Eat foods that are high in calcium and vitamin D, such as milk, cheese, yogurt, eggs, liver, fish, and broccoli.  Limit foods that are high in fat, such as fried foods and desserts.  Limit the amount of red meat and processed meat you eat, such as hot dogs, sausage, bacon, and lunch meats. General instructions  Keep all follow-up visits as told by your health care provider. This is important. ? This includes having regularly scheduled colonoscopies. ? Talk to your health care provider about when you need a colonoscopy. Contact a health care provider if:  You have new or  worsening bleeding during a bowel movement.  You have new or increased blood in your stool.  You have a change in bowel habits.  You lose weight for no known reason. Summary  Polyps are tissue growths inside the body. Polyps can grow in many places, including the colon.  Most colon polyps are noncancerous (benign), but some can become cancerous over time.  This condition is diagnosed with a colonoscopy.  Treatment for this condition involves removing any polyps that are found. Most polyps can be removed during a colonoscopy. This information is not intended to replace advice given to you by your health care provider. Make sure you discuss any questions you have with your health care provider. Document Revised: 12/15/2017 Document Reviewed: 12/15/2017 Elsevier Patient Education  [place] information provided  No MRI until clip gone  Further recommendations to follow pending review of pathology report  At  patient request I called Bhuvan Villagran at [phone] -and reviewed results

## 2019-11-22 NOTE — H&P (Signed)
'@LOGO' @   Primary Care Physician:  [name], FNP Primary Gastroenterologist:  Dr. Gala Romney  Pre-Procedure History & Physical: HPI:  [name] Foster is a 64 y.o. male here for surveillance colonoscopy.  History colonic adenoma.  Past Medical History:  Diagnosis Date  . Arthritis   . CAD (coronary artery disease)    a. 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 DES to left PDA 09/20/17 // c. 10/19: LCx and LPDA stents ok; s/p DES to pLAD and POBA to D1  . Diabetes mellitus without complication ([place])    type 2   . Heart attack ([place])    " a blood clot is actually what caused my heart attack in 2018 " , he states he is unsure where the blood clot originated from   . Hyperlipidemia   . Hypertension   . Insomnia   . Pulmonary hypertension ([place])    a. TTE 03/25/17; EF 60-65%, nl WM, LV diastolic fxn nl, trivial AI, RV cavity size, wall thickness, and sys fxn nl, trivial TR, PASP 39 mmHg, trivial pericardial effusion  . Sleep apnea    could not use a cpap  . Wears dentures    full top-partial bottom  . Wears glasses     Past Surgical History:  Procedure Laterality Date  . COLONOSCOPY  05/13/2010   Single anal papilla, 2 diminutive polyps at 10 cm, left-sided diverticula, one sigmoid and one a sending polyp, otherwise normal colon and TI.  Path with 1 tubular adenoma, otherwise hyperplastic polyps.  Recommendations repeat colonoscopy in 7 years.  . CORONARY ATHERECTOMY N/A 07/03/2018   Procedure: CORONARY ATHERECTOMY;  Surgeon: Burnell Blanks, MD;  Location: [facility];  Service: Cardiovascular;  Laterality: N/A;  . CORONARY STENT INTERVENTION N/A 09/20/2017   Procedure: CORONARY STENT INTERVENTION;  Surgeon: Leonie Man, MD;  Location: [place] [facility];  Service: Cardiovascular;  Laterality: N/A;  . CORONARY STENT INTERVENTION N/A 07/03/2018    Procedure: CORONARY STENT INTERVENTION;  Surgeon: Burnell Blanks, MD;  Location: [place] [facility];  Service: Cardiovascular;  Laterality: N/A;  . CORONARY/GRAFT ACUTE MI REVASCULARIZATION N/A 03/25/2017   Procedure: Coronary/Graft Acute MI Revascularization;  Surgeon: Sherren Mocha, MD;  Location: [facility];  Service: Cardiovascular;  Laterality: N/A;  . GANGLION CYST EXCISION Left 07/25/2014   Procedure: EXCISION VOLAR AND DORSAL GANGLION LEFT WRIST;  Surgeon: Leanora Cover, MD;  Location: [place];  Service: Orthopedics;  Laterality: Left;  . INTRAVASCULAR PRESSURE WIRE/FFR STUDY N/A 09/20/2017   Procedure: INTRAVASCULAR PRESSURE WIRE/FFR STUDY;  Surgeon: Leonie Man, MD;  Location: [place] [facility];  Service: Cardiovascular;  Laterality: N/A;  . INTRAVASCULAR PRESSURE WIRE/FFR STUDY N/A 06/30/2018   Procedure: INTRAVASCULAR PRESSURE WIRE/FFR STUDY;  Surgeon: Nelva Bush, MD;  Location: [place] [facility];  Service: Cardiovascular;  Laterality: N/A;  . KNEE ARTHROSCOPY     rifgr and left  . LEFT HEART CATH AND CORONARY ANGIOGRAPHY N/A 03/25/2017   Procedure: Left Heart Cath and Coronary Angiography;  Surgeon: Sherren Mocha, MD;  Location: [place] [facility];  Service: Cardiovascular;  Laterality: N/A;  . LEFT HEART CATH AND CORONARY ANGIOGRAPHY N/A 09/20/2017   Procedure: LEFT HEART CATH AND CORONARY ANGIOGRAPHY;  Surgeon: Leonie Man, MD;  Location: [facility];  Service: Cardiovascular;  Laterality: N/A;  .  LEFT HEART CATH AND CORONARY ANGIOGRAPHY N/A 06/30/2018   Procedure: LEFT HEART CATH AND CORONARY ANGIOGRAPHY;  Surgeon: Nelva Bush, MD;  Location: [place] [facility];  Service: Cardiovascular;  Laterality: N/A;  . SHOULDER ARTHROSCOPY  2012   left  . SHOULDER ARTHROSCOPY     right  . TEMPORARY PACEMAKER N/A 03/25/2017   Procedure: Temporary Pacemaker;  Surgeon: Sherren Mocha, MD;  Location: [facility];  Service:  Cardiovascular;  Laterality: N/A;  . TONSILLECTOMY    . TOTAL HIP ARTHROPLASTY Right 04/25/2019  . TOTAL HIP ARTHROPLASTY Right 04/25/2019   Procedure: TOTAL HIP ARTHROPLASTY ANTERIOR APPROACH;  Surgeon: Rod Can, MD;  Location: [facility];  Service: Orthopedics;  Laterality: Right;  . TOTAL KNEE ARTHROPLASTY  2002   left    Prior to Admission medications   Medication Sig Start Date End Date Taking? Authorizing Provider  albuterol (VENTOLIN HFA) 108 (90 Base) MCG/ACT inhaler Inhale 2 puffs into the lungs every 6 (six) hours as needed for wheezing or shortness of breath. 04/11/19  Yes [name] F, FNP  amLODipine (NORVASC) 5 MG tablet Take 1 tablet (5 mg total) by mouth daily. 10/30/19  Yes [name] F, FNP  aspirin 81 MG chewable tablet Chew 81 mg by mouth daily.   Yes [provider]  atorvastatin (LIPITOR) 40 MG tablet Take 1 tablet (40 mg total) by mouth daily at 6 PM. 10/30/19  Yes [name] F, FNP  clopidogrel (PLAVIX) 75 MG tablet Take 1 tablet (75 mg total) by mouth daily. 10/30/19  Yes [name] F, FNP  Coenzyme Q10 (COQ-10) 100 MG CAPS Take 100 mg by mouth daily.   Yes [provider]  furosemide (LASIX) 20 MG tablet Take 1 tablet (20 mg total) by mouth daily as needed for edema. 10/30/19  Yes [name] F, FNP  hydrALAZINE (APRESOLINE) 50 MG tablet Take 1 tablet (50 mg total) by mouth every 8 (eight) hours. 10/30/19  Yes [name] F, FNP  HYDROcodone-acetaminophen (NORCO) 7.5-325 MG tablet Take 1 tablet by mouth every 6 (six) hours as needed for moderate pain or severe pain. 10/30/19  Yes [name] F, FNP  isosorbide mononitrate (IMDUR) 30 MG 24 hr tablet Take 0.5 tablets (15 mg total) by mouth daily. 10/30/19  Yes [name] F, FNP  Ketotifen Fumarate (ALLERGY EYE DROPS OP) Place 1 drop into both eyes daily as needed (allergies).   Yes [provider]  losartan (COZAAR) 100 MG tablet Take 1 tablet (100 mg total) by mouth  daily. 10/30/19  Yes [name] F, FNP  metFORMIN (GLUCOPHAGE-XR) 500 MG 24 hr tablet Take 2 tablets (1,000 mg total) by mouth every evening. Patient taking differently: Take 500 mg by mouth in the morning and at bedtime.  10/30/19  Yes [name], FNP  metoprolol tartrate (LOPRESSOR) 50 MG tablet Take 1 tablet (50 mg total) by mouth 2 (two) times daily. 10/30/19  Yes [name] F, FNP  Na Sulfate-K Sulfate-Mg Sulf (SUPREP BOWEL PREP KIT) 17.5-3.13-1.6 GM/177ML SOLN Take 1 kit by mouth as directed. 08/23/19  Yes Garett Tetzloff, Cristopher Estimable, MD  Simethicone (GAS-X PO) Take 1 tablet by mouth daily as needed (gas).   Yes [provider]  clonazePAM (KLONOPIN) 0.25 MG disintegrating tablet Take 1 tablet (0.25 mg total) by mouth 2 (two) times daily as needed. Patient not taking: Reported on 11/15/2019 07/19/19   [name], FNP  nitroGLYCERIN (NITROSTAT) 0.4 MG SL tablet Place 1 tablet (0.4 mg  total) under the tongue every 5 (five) minutes x 3 doses as needed for chest pain. 03/27/17   Rise Mu, PA-C    Allergies as of 08/23/2019  . (No Known Allergies)    Family History  Problem Relation Age of Onset  . Hypertension Mother   . Hypertension Father   . Hypertension Brother   . Heart disease Maternal Grandfather   . Colon cancer Neg Hx     Social History   Socioeconomic History  . Marital status: Married    Spouse name: Not on file  . Number of children: Not on file  . Years of education: Not on file  . Highest education level: Not on file  Occupational History  . Occupation: Retired  Tobacco Use  . Smoking status: Former Smoker    Packs/day: 2.00    Years: 30.00    Pack years: 60.00    Quit date: 03/25/2017    Years since quitting: 2.6  . Smokeless tobacco: Never Used  Substance and Sexual Activity  . Alcohol use: Yes    Comment: 3-6 shots of liquor daily  . Drug use: Not Currently    Types: Marijuana    Comment: last used about 10 years ago (08/23/19)  . Sexual  activity: Not Currently  Other Topics Concern  . Not on file  Social History Narrative   Per wife, pt was preparing to retire soon from his job at [employer].   Social Determinants of Health   Financial Resource Strain:   . Difficulty of Paying Living Expenses:   Food Insecurity:   . Worried About Charity fundraiser in the Last Year:   . Arboriculturist in the Last Year:   Transportation Needs:   . Film/video editor (Medical):   Marland Kitchen Lack of Transportation (Non-Medical):   Physical Activity:   . Days of Exercise per Week:   . Minutes of Exercise per Session:   Stress:   . Feeling of Stress :   Social Connections:   . Frequency of Communication with Friends and Family:   . Frequency of Social Gatherings with Friends and Family:   . Attends Religious Services:   . Active Member of Clubs or Organizations:   . Attends Archivist Meetings:   Marland Kitchen Marital Status:   Intimate Partner Violence:   . Fear of Current or Ex-Partner:   . Emotionally Abused:   Marland Kitchen Physically Abused:   . Sexually Abused:     Review of Systems: See HPI, otherwise negative ROS  Physical Exam: BP (!) 158/86   Temp 98.3 F (36.8 C)   Resp 18   SpO2 97%  General:   Alert,  Well-developed, well-nourished, pleasant and cooperative in NAD Skin:  Intact without significant lesions or rashes. Neck:  Supple; no masses or thyromegaly. No significant cervical adenopathy. Lungs:  Clear throughout to auscultation.   No wheezes, crackles, or rhonchi. No acute distress. Heart:  Regular rate and rhythm; no murmurs, clicks, rubs,  or gallops. Abdomen: Non-distended, normal bowel sounds.  Soft and nontender without appreciable mass or hepatosplenomegaly.  Pulses:  Normal pulses noted. Extremities:  Without clubbing or edema.  Impression/Plan: 64 year old gentleman with a history colonic adenoma.  Here for surveillance colonoscopy per plan. The risks, benefits, limitations, alternatives and imponderables have been  reviewed with the patient. Potential for esophageal dilation, biopsy, etc. have also been reviewed.  Questions have been answered. All parties agreeable.     Notice: This dictation was  prepared with Dragon dictation along with smaller phrase technology. Any transcriptional errors that result from this process are unintentional and may not be corrected upon review.

## 2019-11-23 ENCOUNTER — Encounter: Payer: Self-pay | Admitting: Internal Medicine

## 2019-11-23 LAB — SURGICAL PATHOLOGY

## 2019-11-27 ENCOUNTER — Ambulatory Visit (INDEPENDENT_AMBULATORY_CARE_PROVIDER_SITE_OTHER): Payer: 59 | Admitting: *Deleted

## 2019-11-27 ENCOUNTER — Other Ambulatory Visit: Payer: Self-pay

## 2019-11-27 DIAGNOSIS — Z23 Encounter for immunization: Secondary | ICD-10-CM | POA: Diagnosis not present

## 2019-11-27 NOTE — Progress Notes (Signed)
Pt given Twinrix IM right deltoid and tolerated well.

## 2019-11-27 NOTE — Patient Instructions (Signed)
Hepatitis A; Hepatitis B Vaccine injection What is this medicine? HEPATITIS A VACCINE; HEPATITIS B VACCINE (hep uh TAHY tis A vak SEEN; hep uh TAHY tis B vak SEEN) is a vaccine to protect from an infection with the hepatitis A and B virus. This vaccine does not contain the live viruses. It will not cause a hepatitis infection. This medicine may be used for other purposes; ask your health care provider or pharmacist if you have questions. COMMON BRAND NAME(S): Twinrix What should I tell my health care provider before I take this medicine? They need to know if you have any of these conditions:  bleeding disorder  fever or infection  heart disease  immune system problems  an unusual or allergic reaction to hepatitis A or B vaccine, neomycin, yeast, thimerosal, other medicines, foods, dyes, or preservatives  pregnant or trying to get pregnant  breast-feeding How should I use this medicine? This vaccine is for injection into a muscle. It is given by a health care professional. A copy of Vaccine Information Statements will be given before each vaccination. Read this sheet carefully each time. The sheet may change frequently. Talk to your pediatrician regarding the use of this medicine in children. Special care may be needed. Overdosage: If you think you have taken too much of this medicine contact a poison control center or emergency room at once. NOTE: This medicine is only for you. Do not share this medicine with others. What if I miss a dose? It is important not to miss your dose. Call your doctor or health care professional if you are unable to keep an appointment. What may interact with this medicine?  medicines that suppress your immune function like adalimumab, anakinra, infliximab  medicines to treat cancer  steroid medicines like prednisone or cortisone This list may not describe all possible interactions. Give your health care provider a list of all the medicines, herbs,  non-prescription drugs, or dietary supplements you use. Also tell them if you smoke, drink alcohol, or use illegal drugs. Some items may interact with your medicine. What should I watch for while using this medicine? See your health care provider for all shots of this vaccine as directed. You must have 3 to 4 shots of this vaccine for protection from hepatitis A and B infection. Tell your doctor right away if you have any serious or unusual side effects after getting this vaccine. What side effects may I notice from receiving this medicine? Side effects that you should report to your doctor or health care professional as soon as possible:  allergic reactions like skin rash, itching or hives, swelling of the face, lips, or tongue  breathing problems  confused, irritated  fast, irregular heartbeat  flu-like syndrome  numb, tingling pain  seizures Side effects that usually do not require medical attention (report to your doctor or health care professional if they continue or are bothersome):  diarrhea  fever  headache  loss of appetite  muscle pain  nausea  pain, redness, swelling, or irritation at site where injected  tiredness This list may not describe all possible side effects. Call your doctor for medical advice about side effects. You may report side effects to FDA at 1-800-FDA-1088. Where should I keep my medicine? This drug is given in a hospital or clinic and will not be stored at home. NOTE: This sheet is a summary. It may not cover all possible information. If you have questions about this medicine, talk to your doctor, pharmacist, or health   care provider.  2020 Elsevier/Gold Standard (2008-01-12 15:21:37)  

## 2019-12-21 ENCOUNTER — Ambulatory Visit: Payer: 59

## 2020-01-31 ENCOUNTER — Ambulatory Visit: Payer: 59 | Admitting: Family Medicine

## 2020-01-31 ENCOUNTER — Other Ambulatory Visit: Payer: Self-pay

## 2020-01-31 ENCOUNTER — Encounter: Payer: Self-pay | Admitting: Family Medicine

## 2020-01-31 VITALS — BP 159/79 | HR 61 | Temp 98.2°F | Ht 69.0 in | Wt 205.0 lb

## 2020-01-31 DIAGNOSIS — I10 Essential (primary) hypertension: Secondary | ICD-10-CM

## 2020-01-31 DIAGNOSIS — R600 Localized edema: Secondary | ICD-10-CM

## 2020-01-31 DIAGNOSIS — G894 Chronic pain syndrome: Secondary | ICD-10-CM

## 2020-01-31 DIAGNOSIS — R809 Proteinuria, unspecified: Secondary | ICD-10-CM

## 2020-01-31 DIAGNOSIS — Z79899 Other long term (current) drug therapy: Secondary | ICD-10-CM | POA: Diagnosis not present

## 2020-01-31 DIAGNOSIS — Z6379 Other stressful life events affecting family and household: Secondary | ICD-10-CM

## 2020-01-31 DIAGNOSIS — E1129 Type 2 diabetes mellitus with other diabetic kidney complication: Secondary | ICD-10-CM

## 2020-01-31 DIAGNOSIS — Z862 Personal history of diseases of the blood and blood-forming organs and certain disorders involving the immune mechanism: Secondary | ICD-10-CM

## 2020-01-31 DIAGNOSIS — R0602 Shortness of breath: Secondary | ICD-10-CM

## 2020-01-31 LAB — BAYER DCA HB A1C WAIVED: HB A1C (BAYER DCA - WAIVED): 6.5 % (ref <7.0)

## 2020-01-31 MED ORDER — HYDROCODONE-ACETAMINOPHEN 7.5-325 MG PO TABS: 1.0000 | ORAL_TABLET | Freq: Three times a day (TID) | ORAL | 0 refills | Status: DC

## 2020-01-31 MED ORDER — HYDROCHLOROTHIAZIDE 12.5 MG PO TABS: 12.5000 mg | ORAL_TABLET | Freq: Every day | ORAL | 2 refills | Status: DC

## 2020-01-31 NOTE — Progress Notes (Signed)
Assessment & Plan:  1. Type 2 diabetes mellitus with microalbuminuria, without long-term current use of insulin (HCC) Lab Results  Component Value Date   HGBA1C 6.5 01/31/2020   HGBA1C 6.3 10/30/2019   HGBA1C 6.9 (H) 04/25/2019    - Diabetes is at goal of A1c < 7. - Medications: continue current medications - Patient is currently taking a statin. Patient is taking an ACE-inhibitor/ARB.  - Last foot exam: 04/11/2019 - Last diabetic eye exam: 11/20/2018 that we have records of. Patient reports he had an eye exam a few months ago at [facility] - record requested.  - Urine Microalbumin/Creat Ratio: 07/19/2019 - Bayer DCA Hb A1c Waived - CMP14+EGFR  2. Essential hypertension - Uncontrolled. Rx'd HCTZ to help with BP and lower extremity edema.  - hydrochlorothiazide (HYDRODIURIL) 12.5 MG tablet; Take 1 tablet (12.5 mg total) by mouth daily.  Dispense: 30 tablet; Refill: 2 - CMP14+EGFR  3. Chronic pain syndrome - Well controlled on current regimen.  - HYDROcodone-acetaminophen (NORCO) 7.5-325 MG tablet; Take 1 tablet by mouth every 8 (eight) hours.  Dispense: 90 tablet; Refill: 0 - HYDROcodone-acetaminophen (NORCO) 7.5-325 MG tablet; Take 1 tablet by mouth every 8 (eight) hours.  Dispense: 90 tablet; Refill: 0 - HYDROcodone-acetaminophen (NORCO) 7.5-325 MG tablet; Take 1 tablet by mouth every 8 (eight) hours.  Dispense: 90 tablet; Refill: 0 - CMP14+EGFR  4. Controlled substance agreement signed - UDS & CSA UTD. PDMP reviewed with no red flags.  - HYDROcodone-acetaminophen (NORCO) 7.5-325 MG tablet; Take 1 tablet by mouth every 8 (eight) hours.  Dispense: 90 tablet; Refill: 0 - HYDROcodone-acetaminophen (NORCO) 7.5-325 MG tablet; Take 1 tablet by mouth every 8 (eight) hours.  Dispense: 90 tablet; Refill: 0 - HYDROcodone-acetaminophen (NORCO) 7.5-325 MG tablet; Take 1 tablet by mouth every 8 (eight) hours.  Dispense: 90 tablet; Refill: 0  5. Bilateral lower extremity edema -  Rx'd HCTZ. Continue compression stockings. Low salt diet.  - hydrochlorothiazide (HYDRODIURIL) 12.5 MG tablet; Take 1 tablet (12.5 mg total) by mouth daily.  Dispense: 30 tablet; Refill: 2 - CMP14+EGFR  6. Shortness of breath - Patient is not in need of a maintenance inhaler as he rarely uses the rescue inhaler.   7. Stress due to illness of family member - Doing well. He wife has done well with cancer treatments.   8. History of anemia - CBC with Differential/Platelet   Return in about 3 months (around 05/02/2020) for follow-up of chronic medication conditions.  [name], MSN, APRN, FNP-C [facility]  Subjective:    Patient ID: [name] Foster, male    DOB: March 05, 1956, 64 y.o.   MRN: [id number]  Patient Care Team: [name], FNP as PCP - General (Family Medicine) Sherren Mocha, MD as Consulting Physician (Cardiology) [facility] as Consulting Physician (Orthopedic Surgery)   Chief Complaint:  Chief Complaint  Patient presents with  . Diabetes    3 month follow up  . Edema    Right lower leg- Patient states it has been on and off.  Would like to know if amlodipine can cause the edema?    HPI: [name] Foster is a 64 y.o. male presenting on 01/31/2020 for Diabetes (3 month follow up) and Edema (Right lower leg- Patient states it has been on and off.  Would like to know if amlodipine can cause the edema?)  Patient's BP is elevated today. He reports his BP has been running high at home as well for the past  month.   Diabetes: Patient presents for follow up of diabetes. Current symptoms include: none. Known diabetic complications: none. Medication compliance: yes. Current diet: in general, a "healthy" diet  . Current exercise: bicycling and walking. Home blood sugar records: fasting range 100-130. Is he  on ACE inhibitor or angiotensin II receptor blocker? Yes. Is he on a statin? Yes.   Lab Results  Component Value Date   HGBA1C 6.5  01/31/2020   HGBA1C 6.3 10/30/2019   HGBA1C 6.9 (H) 04/25/2019   Lab Results  Component Value Date   LDLCALC 58 10/30/2019   CREATININE 1.11 01/31/2020     Pain assessment: Cause of pain- severe arthritis Pain location- back Pain on scale of 1-10- 7/10 at the worst; 3/10 with medication Frequency- daily What increases pain- movement, ADLs, awkward positions What makes pain better- rest, pain medication, heat, position changes Effects on ADL - getting dressed Any change in general medical condition- no  Current opioids rx- Norco 7.5/325 Q6H PRN. Patient takes between one and three a day, depending on the day and what is going on.  # meds rx- 120 Effectiveness of current meds- works well Adverse reactions form pain meds- none Morphine equivalent- 30 MME/day  Pill count performed-No Last drug screen - 04/18/2019 ( high risk q38m moderate risk q640mlow risk yearly ) Urine drug screen today- No Was the [place]- yes             If yes were their any concerning findings? - no  Overdose risk: 250  Opioid Risk  01/31/2020  Alcohol 0  Illegal Drugs 0  Rx Drugs 0  Alcohol 3  Illegal Drugs 0  Rx Drugs 0  Age between 16-45 years  0  History of Preadolescent Sexual Abuse 0  Psychological Disease 0  Depression 0  Opioid Risk Tool Scoring 3  Opioid Risk Interpretation Low Risk   Pain contract signed on: 04/18/2019  Patient's pharmacy sent over a fax to discuss the possibility of a maintenance inhaler for the patient. He reports he very seldom uses the Albuterol inhaler. When he does have to use it, it is either really hot and humid or really really cold.   New complaints: Patient reports his right leg swells more than left, but that they both swell. He is wondering if it is related to the amlodipine. He has been on amlodipine for many years and reports the swelling has only been recent. He does normally wear compression hose, but does not have them on today.    Social  history:  Relevant past medical, surgical, family and social history reviewed and updated as indicated. Interim medical history since our last visit reviewed.  Allergies and medications reviewed and updated.  DATA REVIEWED: CHART IN EPIC  ROS: Negative unless specifically indicated above in HPI.    Current Outpatient Medications:  .  albuterol (VENTOLIN HFA) 108 (90 Base) MCG/ACT inhaler, Inhale 2 puffs into the lungs every 6 (six) hours as needed for wheezing or shortness of breath., Disp: 18 g, Rfl: 2 .  amLODipine (NORVASC) 5 MG tablet, Take 1 tablet (5 mg total) by mouth daily., Disp: 90 tablet, Rfl: 2 .  aspirin 81 MG chewable tablet, Chew 81 mg by mouth daily., Disp: , Rfl:  .  atorvastatin (LIPITOR) 40 MG tablet, Take 1 tablet (40 mg total) by mouth daily at 6 PM., Disp: 90 tablet, Rfl: 2 .  clopidogrel (PLAVIX) 75 MG tablet, Take 1 tablet (75 mg total) by mouth daily.,  Disp: 90 tablet, Rfl: 2 .  Coenzyme Q10 (COQ-10) 100 MG CAPS, Take 100 mg by mouth daily., Disp: , Rfl:  .  hydrALAZINE (APRESOLINE) 50 MG tablet, Take 1 tablet (50 mg total) by mouth every 8 (eight) hours., Disp: 270 tablet, Rfl: 2 .  HYDROcodone-acetaminophen (NORCO) 7.5-325 MG tablet, Take 1 tablet by mouth every 8 (eight) hours., Disp: 90 tablet, Rfl: 0 .  HYDROcodone-acetaminophen (NORCO) 7.5-325 MG tablet, Take 1 tablet by mouth every 8 (eight) hours., Disp: 90 tablet, Rfl: 0 .  HYDROcodone-acetaminophen (NORCO) 7.5-325 MG tablet, Take 1 tablet by mouth every 8 (eight) hours., Disp: 90 tablet, Rfl: 0 .  isosorbide mononitrate (IMDUR) 30 MG 24 hr tablet, Take 0.5 tablets (15 mg total) by mouth daily., Disp: 45 tablet, Rfl: 2 .  Ketotifen Fumarate (ALLERGY EYE DROPS OP), Place 1 drop into both eyes daily as needed (allergies)., Disp: , Rfl:  .  losartan (COZAAR) 100 MG tablet, Take 1 tablet (100 mg total) by mouth daily., Disp: 90 tablet, Rfl: 2 .  metFORMIN (GLUCOPHAGE-XR) 500 MG 24 hr tablet, Take 2 tablets  (1,000 mg total) by mouth every evening. (Patient taking differently: Take 500 mg by mouth in the morning and at bedtime. ), Disp: 180 tablet, Rfl: 2 .  metoprolol tartrate (LOPRESSOR) 50 MG tablet, Take 1 tablet (50 mg total) by mouth 2 (two) times daily., Disp: 180 tablet, Rfl: 2 .  nitroGLYCERIN (NITROSTAT) 0.4 MG SL tablet, Place 1 tablet (0.4 mg total) under the tongue every 5 (five) minutes x 3 doses as needed for chest pain., Disp: 25 tablet, Rfl: 11 .  Simethicone (GAS-X PO), Take 1 tablet by mouth daily as needed (gas)., Disp: , Rfl:  .  hydrochlorothiazide (HYDRODIURIL) 12.5 MG tablet, Take 1 tablet (12.5 mg total) by mouth daily., Disp: 30 tablet, Rfl: 2   No Known Allergies Past Medical History:  Diagnosis Date  . Arthritis   . CAD (coronary artery disease)    a. 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 DES to left PDA 09/20/17 // c. 10/19: LCx and LPDA stents ok; s/p DES to pLAD and POBA to D1  . Diabetes mellitus without complication ([place])    type 2   . Heart attack ([place])    " a blood clot is actually what caused my heart attack in 2018 " , he states he is unsure where the blood clot originated from   . Hyperlipidemia   . Hypertension   . Insomnia   . Pulmonary hypertension ([place])    a. TTE 03/25/17; EF 60-65%, nl WM, LV diastolic fxn nl, trivial AI, RV cavity size, wall thickness, and sys fxn nl, trivial TR, PASP 39 mmHg, trivial pericardial effusion  . Sleep apnea    could not use a cpap  . Wears dentures    full top-partial bottom  . Wears glasses     Past Surgical History:  Procedure Laterality Date  . COLONOSCOPY  05/13/2010   Single anal papilla, 2 diminutive polyps at 10 cm, left-sided diverticula, one sigmoid and one a sending polyp, otherwise normal colon and TI.  Path with 1 tubular adenoma, otherwise hyperplastic polyps.  Recommendations  repeat colonoscopy in 7 years.  . COLONOSCOPY WITH PROPOFOL N/A 11/22/2019   Procedure: COLONOSCOPY WITH PROPOFOL;  Surgeon: Daneil Dolin, MD;  [facility];  Service: Endoscopy;  Laterality: N/A;  9:15am  . CORONARY ATHERECTOMY N/A 07/03/2018   Procedure: CORONARY ATHERECTOMY;  Surgeon: Burnell Blanks, MD;  Location: [facility];  Service: Cardiovascular;  Laterality: N/A;  . CORONARY STENT INTERVENTION N/A 09/20/2017   Procedure: CORONARY STENT INTERVENTION;  Surgeon: Leonie Man, MD;  Location: [facility];  Service: Cardiovascular;  Laterality: N/A;  . CORONARY STENT INTERVENTION N/A 07/03/2018   Procedure: CORONARY STENT INTERVENTION;  Surgeon: Burnell Blanks, MD;  Location: [facility];  Service: Cardiovascular;  Laterality: N/A;  . CORONARY/GRAFT ACUTE MI REVASCULARIZATION N/A 03/25/2017   Procedure: Coronary/Graft Acute MI Revascularization;  Surgeon: Sherren Mocha, MD;  Location: [facility];  Service: Cardiovascular;  Laterality: N/A;  . GANGLION CYST EXCISION Left 07/25/2014   Procedure: EXCISION VOLAR AND DORSAL GANGLION LEFT WRIST;  Surgeon: Leanora Cover, MD;  Location: [facility];  Service: Orthopedics;  Laterality: Left;  . INTRAVASCULAR PRESSURE WIRE/FFR STUDY N/A 09/20/2017   Procedure: INTRAVASCULAR PRESSURE WIRE/FFR STUDY;  Surgeon: Leonie Man, MD;  Location: [facility];  Service: Cardiovascular;  Laterality: N/A;  . INTRAVASCULAR PRESSURE WIRE/FFR STUDY N/A 06/30/2018   Procedure: INTRAVASCULAR PRESSURE WIRE/FFR STUDY;  Surgeon: Nelva Bush, MD;  Location: [facility];  Service: Cardiovascular;  Laterality: N/A;  . KNEE ARTHROSCOPY     rifgr and left  . LEFT HEART CATH AND CORONARY ANGIOGRAPHY N/A 03/25/2017   Procedure: Left Heart Cath and Coronary Angiography;  Surgeon: Sherren Mocha, MD;  Location: [facility];  Service: Cardiovascular;  Laterality: N/A;  . LEFT  HEART CATH AND CORONARY ANGIOGRAPHY N/A 09/20/2017   Procedure: LEFT HEART CATH AND CORONARY ANGIOGRAPHY;  Surgeon: Leonie Man, MD;  Location: [facility];  Service: Cardiovascular;  Laterality: N/A;  . LEFT HEART CATH AND CORONARY ANGIOGRAPHY N/A 06/30/2018   Procedure: LEFT HEART CATH AND CORONARY ANGIOGRAPHY;  Surgeon: Nelva Bush, MD;  Location: [facility];  Service: Cardiovascular;  Laterality: N/A;  . POLYPECTOMY  11/22/2019   Procedure: POLYPECTOMY;  Surgeon: Daneil Dolin, MD;  [facility];  Service: Endoscopy;;  . SHOULDER ARTHROSCOPY  2012   left  . SHOULDER ARTHROSCOPY     right  . TEMPORARY PACEMAKER N/A 03/25/2017   Procedure: Temporary Pacemaker;  Surgeon: Sherren Mocha, MD;  Location: [facility];  Service: Cardiovascular;  Laterality: N/A;  . TONSILLECTOMY    . TOTAL HIP ARTHROPLASTY Right 04/25/2019  . TOTAL HIP ARTHROPLASTY Right 04/25/2019   Procedure: TOTAL HIP ARTHROPLASTY ANTERIOR APPROACH;  Surgeon: Rod Can, MD;  Location[facility];  Service: Orthopedics;  Laterality: Right;  . TOTAL KNEE ARTHROPLASTY  2002   left    Social History   Socioeconomic History  . Marital status: Married    Spouse name: Not on file  . Number of children: Not on file  . Years of education: Not on file  . Highest education level: Not on file  Occupational History  . Occupation: Retired  Tobacco Use  . Smoking status: Former Smoker    Packs/day: 2.00    Years: 30.00    Pack years: 60.00    Quit date: 03/25/2017    Years since quitting: 2.8  . Smokeless tobacco: Never Used  Substance and Sexual Activity  . Alcohol use: Yes    Comment: 3-6 shots of liquor daily  . Drug use: Not Currently    Types: Marijuana    Comment: last  used about 10 years ago (08/23/19)  . Sexual activity: Not Currently  Other Topics Concern  . Not on file  Social History Narrative   Per wife, pt was preparing to retire soon from his job at [employer].    Social Determinants of Health   Financial Resource Strain:   . Difficulty of Paying Living Expenses:   Food Insecurity:   . Worried About Charity fundraiser in the Last Year:   . Arboriculturist in the Last Year:   Transportation Needs:   . Film/video editor (Medical):   Marland Kitchen Lack of Transportation (Non-Medical):   Physical Activity:   . Days of Exercise per Week:   . Minutes of Exercise per Session:   Stress:   . Feeling of Stress :   Social Connections:   . Frequency of Communication with Friends and Family:   . Frequency of Social Gatherings with Friends and Family:   . Attends Religious Services:   . Active Member of Clubs or Organizations:   . Attends Archivist Meetings:   Marland Kitchen Marital Status:   Intimate Partner Violence:   . Fear of Current or Ex-Partner:   . Emotionally Abused:   Marland Kitchen Physically Abused:   . Sexually Abused:         Objective:    BP (!) 159/79   Pulse 61   Temp 98.2 F (36.8 C) (Temporal)   Ht _0  (1.753 m)   Wt 205 lb (93 kg)   SpO2 95%   BMI 30.27 kg/m   Wt Readings from Last 3 Encounters:  01/31/20 205 lb (93 kg)  11/19/19 210 lb (95.3 kg)  10/30/19 210 lb 3.2 oz (95.3 kg)    Physical Exam Vitals reviewed.  Constitutional:      General: He is not in acute distress.    Appearance: Normal appearance. He is obese. He is not ill-appearing, toxic-appearing or diaphoretic.  HENT:     Head: Normocephalic and atraumatic.  Eyes:     General: No scleral icterus.       Right eye: No discharge.        Left eye: No discharge.     Conjunctiva/sclera: Conjunctivae normal.  Cardiovascular:     Rate and Rhythm: Normal rate and regular rhythm.     Heart sounds: Normal heart sounds. No murmur. No friction rub. No gallop.   Pulmonary:     Effort: Pulmonary effort is normal. No respiratory distress.     Breath sounds: Normal breath sounds. No stridor. No wheezing, rhonchi or rales.  Musculoskeletal:        General: Normal range of  motion.     Cervical back: Normal range of motion.  Skin:    General: Skin is warm and dry.  Neurological:     Mental Status: He is alert and oriented to person, place, and time. Mental status is at baseline.  Psychiatric:        Mood and Affect: Mood normal.        Behavior: Behavior normal.        Thought Content: Thought content normal.        Judgment: Judgment normal.     No results found for: TSH Lab Results  Component Value Date   WBC 4.6 01/31/2020   HGB 12.2 (L) 01/31/2020   HCT 38.3 01/31/2020   MCV 95 01/31/2020   PLT 173 01/31/2020   Lab Results  Component Value Date   NA 140 01/31/2020  K 4.5 01/31/2020   CO2 22 01/31/2020   GLUCOSE 144 (H) 01/31/2020   BUN 14 01/31/2020   CREATININE 1.11 01/31/2020   BILITOT 0.3 01/31/2020   ALKPHOS 67 01/31/2020   AST 46 (H) 01/31/2020   ALT 39 01/31/2020   PROT 6.5 01/31/2020   ALBUMIN 4.3 01/31/2020   CALCIUM 9.1 01/31/2020   ANIONGAP 7 04/27/2019   Lab Results  Component Value Date   CHOL 131 10/30/2019   Lab Results  Component Value Date   HDL 48 10/30/2019   Lab Results  Component Value Date   LDLCALC 58 10/30/2019   Lab Results  Component Value Date   TRIG 142 10/30/2019   Lab Results  Component Value Date   CHOLHDL 2.7 10/30/2019   Lab Results  Component Value Date   HGBA1C 6.5 01/31/2020

## 2020-02-01 LAB — CMP14+EGFR
ALT: 39 IU/L (ref 0–44)
AST: 46 IU/L — ABNORMAL HIGH (ref 0–40)
Albumin/Globulin Ratio: 2 (ref 1.2–2.2)
Albumin: 4.3 g/dL (ref 3.8–4.8)
Alkaline Phosphatase: 67 IU/L (ref 48–121)
BUN/Creatinine Ratio: 13 (ref 10–24)
BUN: 14 mg/dL (ref 8–27)
Bilirubin Total: 0.3 mg/dL (ref 0.0–1.2)
CO2: 22 mmol/L (ref 20–29)
Calcium: 9.1 mg/dL (ref 8.6–10.2)
Chloride: 101 mmol/L (ref 96–106)
Creatinine, Ser: 1.11 mg/dL (ref 0.76–1.27)
GFR calc Af Amer: 81 mL/min/{1.73_m2} (ref >59)
GFR calc non Af Amer: 70 mL/min/{1.73_m2} (ref >59)
Globulin, Total: 2.2 g/dL (ref 1.5–4.5)
Glucose: 144 mg/dL — ABNORMAL HIGH (ref 65–99)
Potassium: 4.5 mmol/L (ref 3.5–5.2)
Sodium: 140 mmol/L (ref 134–144)
Total Protein: 6.5 g/dL (ref 6.0–8.5)

## 2020-02-01 LAB — CBC WITH DIFFERENTIAL/PLATELET
Basophils Absolute: 0.1 10*3/uL (ref 0.0–0.2)
Basos: 1 %
EOS (ABSOLUTE): 0.1 10*3/uL (ref 0.0–0.4)
Eos: 2 %
Hematocrit: 38.3 % (ref 37.5–51.0)
Hemoglobin: 12.2 g/dL — ABNORMAL LOW (ref 13.0–17.7)
Immature Grans (Abs): 0 10*3/uL (ref 0.0–0.1)
Immature Granulocytes: 1 %
Lymphocytes Absolute: 1.3 10*3/uL (ref 0.7–3.1)
Lymphs: 29 %
MCH: 30.1 pg (ref 26.6–33.0)
MCHC: 31.9 g/dL (ref 31.5–35.7)
MCV: 95 fL (ref 79–97)
Monocytes Absolute: 0.4 10*3/uL (ref 0.1–0.9)
Monocytes: 8 %
Neutrophils Absolute: 2.7 10*3/uL (ref 1.4–7.0)
Neutrophils: 59 %
Platelets: 173 10*3/uL (ref 150–450)
RBC: 4.05 x10E6/uL — ABNORMAL LOW (ref 4.14–5.80)
RDW: 14.1 % (ref 11.6–15.4)
WBC: 4.6 10*3/uL (ref 3.4–10.8)

## 2020-03-05 ENCOUNTER — Other Ambulatory Visit: Payer: Self-pay | Admitting: Family Medicine

## 2020-03-26 ENCOUNTER — Encounter: Payer: Self-pay | Admitting: Dermatology

## 2020-03-26 ENCOUNTER — Ambulatory Visit (INDEPENDENT_AMBULATORY_CARE_PROVIDER_SITE_OTHER): Payer: Medicare HMO | Admitting: Dermatology

## 2020-03-26 ENCOUNTER — Other Ambulatory Visit: Payer: Self-pay

## 2020-03-26 DIAGNOSIS — L821 Other seborrheic keratosis: Secondary | ICD-10-CM

## 2020-03-26 DIAGNOSIS — C4492 Squamous cell carcinoma of skin, unspecified: Secondary | ICD-10-CM

## 2020-03-26 DIAGNOSIS — D692 Other nonthrombocytopenic purpura: Secondary | ICD-10-CM

## 2020-03-26 DIAGNOSIS — D485 Neoplasm of uncertain behavior of skin: Secondary | ICD-10-CM

## 2020-03-26 DIAGNOSIS — C4442 Squamous cell carcinoma of skin of scalp and neck: Secondary | ICD-10-CM | POA: Diagnosis not present

## 2020-03-26 HISTORY — DX: Squamous cell carcinoma of skin, unspecified: C44.92

## 2020-03-26 NOTE — Patient Instructions (Addendum)
Biopsy, Surgery (Curettage) & Surgery (Excision) Aftercare Instructions  1. Okay to remove bandage in 24 hours  2. Wash area with soap and water  3. Apply Vaseline to area twice daily until healed (Not Neosporin)  4. Okay to cover with a Band-Aid to decrease the chance of infection or prevent irritation from clothing; also it's okay to uncover lesion at home.  5. Suture instructions: return to our office in 7-10 or 10-14 days for a nurse visit for suture removal. Variable healing with sutures, if pain or itching occurs call our office. It's okay to shower or bathe 24 hours after sutures are given.  6. The following risks may occur after a biopsy, curettage or excision: bleeding, scarring, discoloration, recurrence, infection (redness, yellow drainage, pain or swelling).  7. For questions, concerns and results call our office at [facility] before 4pm & Friday before 3pm. Biopsy results will be available in 1 week. First visit for [name] Foster date of birth 07/07/1956.  He had a growth on his left temple removed perhaps by Dr. Braulio Conte beavers years ago and it has regrown.  Clinically this textured 1.5 cm monochrome brown elevation represents a benign seborrheic keratosis.  There are multiple other seborrheic keratoses particularly on the back as well as the right temple.  He is not interested in removal if it is benign, and with dermoscopy it shows no atypical features so unless there is clinical change we will not plan on reremoving this.  I will try to get his old records to confirm a previous biopsy was done.  The rest of his general skin examination from the waist up showed no atypical moles.  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  There are some open comedones on the lower back which do not require removal.  On the top of his scalp is a pearly pink 4 mm papule which may be an early basal cell skin cancer.   Biopsy was obtained and I asked [name] to call my office in 2 days to discuss the result.  If the scalp lesion does not require any more work, follow-up can be on a as needed basis.

## 2020-03-28 ENCOUNTER — Telehealth: Payer: Self-pay

## 2020-03-28 NOTE — Telephone Encounter (Signed)
-----   Message from Lavonna Monarch, MD sent at 03/28/2020  7:18 AM EDT ----- Schedule surgery with Dr. Darene Lamer

## 2020-03-28 NOTE — Telephone Encounter (Signed)
Phone call to patient with his pathology results. Patient aware of results.  

## 2020-04-01 ENCOUNTER — Other Ambulatory Visit: Payer: Self-pay | Admitting: Physician Assistant

## 2020-04-03 DIAGNOSIS — R609 Edema, unspecified: Secondary | ICD-10-CM | POA: Diagnosis not present

## 2020-04-03 DIAGNOSIS — I252 Old myocardial infarction: Secondary | ICD-10-CM | POA: Diagnosis not present

## 2020-04-03 DIAGNOSIS — Z6830 Body mass index (BMI) 30.0-30.9, adult: Secondary | ICD-10-CM | POA: Diagnosis not present

## 2020-04-03 DIAGNOSIS — E669 Obesity, unspecified: Secondary | ICD-10-CM | POA: Diagnosis not present

## 2020-04-03 DIAGNOSIS — E1165 Type 2 diabetes mellitus with hyperglycemia: Secondary | ICD-10-CM | POA: Diagnosis not present

## 2020-04-03 DIAGNOSIS — E785 Hyperlipidemia, unspecified: Secondary | ICD-10-CM | POA: Diagnosis not present

## 2020-04-03 DIAGNOSIS — G8929 Other chronic pain: Secondary | ICD-10-CM | POA: Diagnosis not present

## 2020-04-03 DIAGNOSIS — I2510 Atherosclerotic heart disease of native coronary artery without angina pectoris: Secondary | ICD-10-CM | POA: Diagnosis not present

## 2020-04-03 DIAGNOSIS — M2550 Pain in unspecified joint: Secondary | ICD-10-CM | POA: Diagnosis not present

## 2020-04-03 DIAGNOSIS — I10 Essential (primary) hypertension: Secondary | ICD-10-CM | POA: Diagnosis not present

## 2020-04-20 ENCOUNTER — Encounter: Payer: Self-pay | Admitting: Dermatology

## 2020-04-20 NOTE — Progress Notes (Signed)
   New Patient   Subjective  [name] Foster is a 64 y.o. male who presents for the following: New Patient (Initial Visit) (Patient here today for skin check.  Patient has a spot on his left temple was removed 10 years ago came back clear.).  Growth Location: Scalp Duration:  Quality:  Associated Signs/Symptoms: Modifying Factors:  Severity:  Timing: Context:    The following portions of the chart were reviewed this encounter and updated as appropriate: Tobacco  Allergies  Meds  Problems  Med Hx  Surg Hx  Fam Hx      Objective  Well appearing patient in no apparent distress; mood and affect are within normal limits.  All skin waist up examined.   Assessment & Plan  Neoplasm of uncertain behavior of skin Mid Parietal Scalp  Skin / nail biopsy Type of biopsy: tangential   Informed consent: discussed and consent obtained   Timeout: patient name, date of birth, surgical site, and procedure verified   Procedure prep:  Patient was prepped and draped in usual sterile fashion Prep type:  Chlorhexidine Anesthesia: the lesion was anesthetized in a standard fashion   Anesthetic:  1% lidocaine w/ epinephrine 1-100,000 local infiltration Instrument used: flexible razor blade   Hemostasis achieved with: ferric subsulfate   Outcome: patient tolerated procedure well   Post-procedure details: wound care instructions given    Specimen 1 - Surgical pathology Differential Diagnosis: BCC vs SCC Check Margins: No  Senile purpura (HCC) (2) Left Forearm - Posterior; Right Forearm - Posterior  May try over-the-counter Dermend cream daily after bathing to areas prone to bruising  Seborrheic keratosis (2) Left Upper Back; Right Upper Back  Leave if stable

## 2020-04-24 ENCOUNTER — Other Ambulatory Visit: Payer: Self-pay | Admitting: Family Medicine

## 2020-04-24 DIAGNOSIS — I10 Essential (primary) hypertension: Secondary | ICD-10-CM

## 2020-04-24 DIAGNOSIS — R600 Localized edema: Secondary | ICD-10-CM

## 2020-04-29 ENCOUNTER — Other Ambulatory Visit: Payer: Self-pay

## 2020-04-29 ENCOUNTER — Ambulatory Visit (INDEPENDENT_AMBULATORY_CARE_PROVIDER_SITE_OTHER): Payer: Medicare HMO | Admitting: *Deleted

## 2020-04-29 DIAGNOSIS — Z23 Encounter for immunization: Secondary | ICD-10-CM

## 2020-04-29 NOTE — Progress Notes (Signed)
Twinrix given in left deltoid and patient tolerated well.

## 2020-05-02 ENCOUNTER — Other Ambulatory Visit: Payer: Self-pay

## 2020-05-02 ENCOUNTER — Encounter: Payer: Self-pay | Admitting: Family Medicine

## 2020-05-02 ENCOUNTER — Ambulatory Visit (INDEPENDENT_AMBULATORY_CARE_PROVIDER_SITE_OTHER): Payer: Medicare HMO | Admitting: Family Medicine

## 2020-05-02 VITALS — BP 144/73 | HR 63 | Temp 98.2°F | Ht 69.0 in | Wt 207.4 lb

## 2020-05-02 DIAGNOSIS — Z23 Encounter for immunization: Secondary | ICD-10-CM

## 2020-05-02 DIAGNOSIS — E1129 Type 2 diabetes mellitus with other diabetic kidney complication: Secondary | ICD-10-CM | POA: Diagnosis not present

## 2020-05-02 DIAGNOSIS — Z79899 Other long term (current) drug therapy: Secondary | ICD-10-CM

## 2020-05-02 DIAGNOSIS — I10 Essential (primary) hypertension: Secondary | ICD-10-CM | POA: Diagnosis not present

## 2020-05-02 DIAGNOSIS — R809 Proteinuria, unspecified: Secondary | ICD-10-CM

## 2020-05-02 DIAGNOSIS — R6889 Other general symptoms and signs: Secondary | ICD-10-CM | POA: Diagnosis not present

## 2020-05-02 DIAGNOSIS — G894 Chronic pain syndrome: Secondary | ICD-10-CM

## 2020-05-02 DIAGNOSIS — D649 Anemia, unspecified: Secondary | ICD-10-CM

## 2020-05-02 LAB — BAYER DCA HB A1C WAIVED: HB A1C (BAYER DCA - WAIVED): 6.8 % (ref <7.0)

## 2020-05-02 MED ORDER — HYDROCODONE-ACETAMINOPHEN 7.5-325 MG PO TABS: 1.0000 | ORAL_TABLET | Freq: Three times a day (TID) | ORAL | 0 refills | Status: DC

## 2020-05-02 NOTE — Progress Notes (Signed)
Assessment & Plan:  1. Type 2 diabetes mellitus with microalbuminuria, without long-term current use of insulin (HCC) Lab Results  Component Value Date   HGBA1C 6.8 05/02/2020   HGBA1C 6.5 01/31/2020   HGBA1C 6.3 10/30/2019  - Diabetes is at goal of A1c < 7. - Medications: continue current medications - Patient is currently taking a statin. Patient is taking an ACE-inhibitor/ARB.  - Last foot exam: 05/02/2020 - Last diabetic eye exam: recent per patient - record requested from [facility] - Urine Microalbumin/Creat Ratio: 07/19/2019 - Instruction/counseling given: discussed foot care - Bayer DCA Hb A1c Waived - CMP14+EGFR - Anemia Profile B  2. Essential hypertension - Well controlled on current regimen.   3. Chronic pain syndrome - Well controlled on current regimen.  - HYDROcodone-acetaminophen (NORCO) 7.5-325 MG tablet; Take 1 tablet by mouth every 8 (eight) hours.  Dispense: 90 tablet; Refill: 0 - HYDROcodone-acetaminophen (NORCO) 7.5-325 MG tablet; Take 1 tablet by mouth every 8 (eight) hours.  Dispense: 90 tablet; Refill: 0 - HYDROcodone-acetaminophen (NORCO) 7.5-325 MG tablet; Take 1 tablet by mouth every 8 (eight) hours.  Dispense: 90 tablet; Refill: 0 - Compliance Drug Analysis, Ur  4. Controlled substance agreement signed - Signed for Norco.  - HYDROcodone-acetaminophen ([place]) 7.5-325 MG tablet; Take 1 tablet by mouth every 8 (eight) hours.  Dispense: 90 tablet; Refill: 0 - HYDROcodone-acetaminophen (NORCO) 7.5-325 MG tablet; Take 1 tablet by mouth every 8 (eight) hours.  Dispense: 90 tablet; Refill: 0 - HYDROcodone-acetaminophen (NORCO) 7.5-325 MG tablet; Take 1 tablet by mouth every 8 (eight) hours.  Dispense: 90 tablet; Refill: 0 - Compliance Drug Analysis, Ur  5. Anemia, unspecified type - Anemia Profile B   Return in about 3 months (around 08/02/2020) for annual physical/3 month pain contract.  [name], MSN, APRN, FNP-C [facility]  Subjective:    Patient ID: [name] Foster, male    DOB: 1956-06-14, 64 y.o.   MRN: [id number]  Patient Care Team: [name], FNP as PCP - General (Family Medicine) Sherren Mocha, MD as Consulting Physician (Cardiology) [facility] as Consulting Physician (Orthopedic Surgery) Lavonna Monarch, MD as Consulting Physician (Dermatology)   Chief Complaint:  Chief Complaint  Patient presents with  . Diabetes    3 month follow up of chronic medical conditions    HPI: [name] Foster is a 64 y.o. male presenting on 05/02/2020 for Diabetes (3 month follow up of chronic medical conditions)  Diabetes: Patient presents for follow up of diabetes. Current symptoms include: none. Known diabetic complications: none. Medication compliance: yes. Current diet: in general, a "healthy" diet  . Current exercise: walking. Is he  on ACE inhibitor or angiotensin II receptor blocker? Yes. Is he on a statin? Yes.   Lab Results  Component Value Date   HGBA1C 6.8 05/02/2020   HGBA1C 6.5 01/31/2020   HGBA1C 6.3 10/30/2019   Lab Results  Component Value Date   LDLCALC 58 10/30/2019   CREATININE 1.11 01/31/2020     Pain assessment: Cause of pain- severe arthritis Pain location- back Pain on scale of 1-10- 7/10 without medication; 3/10 with medication Frequency- daily What increases pain- movement, ADLs, awkward positions What makes pain better- rest, pain medication, heat, position changes Effects on ADL- getting dressed is more difficult Any change in general medical condition- no  Current opioids rx- Norco 7.5/325 Q8H PRN # meds rx- 90 Effectiveness of current meds- working well Adverse reactions form pain meds- none Morphine equivalent- 22.50  MME/day  Pill count performed-No Last drug screen - 04/18/2019 ( high risk q45m moderate risk q660mlow risk yearly ) Urine drug screen today- Yes Was the NCSpureviewed- Yes  If yes were their any concerning findings? -  No  Overdose risk: 240  Opioid Risk  01/31/2020  Alcohol 0  Illegal Drugs 0  Rx Drugs 0  Alcohol 3  Illegal Drugs 0  Rx Drugs 0  Age between 16-45 years  0  History of Preadolescent Sexual Abuse 0  Psychological Disease 0  Depression 0  Opioid Risk Tool Scoring 3  Opioid Risk Interpretation Low Risk   Pain contract signed on: 05/02/2020   New complaints: None  Social history:  Relevant past medical, surgical, family and social history reviewed and updated as indicated. Interim medical history since our last visit reviewed.  Allergies and medications reviewed and updated.  DATA REVIEWED: CHART IN EPIC  ROS: Negative unless specifically indicated above in HPI.    Current Outpatient Medications:  .  albuterol (VENTOLIN HFA) 108 (90 Base) MCG/ACT inhaler, Inhale 2 puffs into the lungs every 6 (six) hours as needed for wheezing or shortness of breath., Disp: 18 g, Rfl: 2 .  amLODipine (NORVASC) 5 MG tablet, Take 1 tablet (5 mg total) by mouth daily., Disp: 90 tablet, Rfl: 2 .  Artificial Tear Ointment (DRY EYES OP), Apply to eye., Disp: , Rfl:  .  aspirin 81 MG chewable tablet, Chew 81 mg by mouth daily., Disp: , Rfl:  .  atorvastatin (LIPITOR) 40 MG tablet, Take 1 tablet (40 mg total) by mouth daily at 6 PM., Disp: 90 tablet, Rfl: 2 .  clopidogrel (PLAVIX) 75 MG tablet, Take 1 tablet (75 mg total) by mouth daily., Disp: 90 tablet, Rfl: 2 .  Coenzyme Q10 (COQ-10) 100 MG CAPS, Take 100 mg by mouth daily., Disp: , Rfl:  .  furosemide (LASIX) 20 MG tablet, Take 20 mg by mouth daily., Disp: , Rfl:  .  hydrALAZINE (APRESOLINE) 50 MG tablet, Take 1 tablet (50 mg total) by mouth every 8 (eight) hours., Disp: 270 tablet, Rfl: 2 .  hydrochlorothiazide (HYDRODIURIL) 12.5 MG tablet, TAKE 1 TABLET BY MOUTH EVERY DAY, Disp: 90 tablet, Rfl: 0 .  HYDROcodone-acetaminophen (NORCO) 7.5-325 MG tablet, Take 1 tablet by mouth every 8 (eight) hours., Disp: 90 tablet, Rfl: 0 .  isosorbide  mononitrate (IMDUR) 30 MG 24 hr tablet, Take 0.5 tablets (15 mg total) by mouth daily., Disp: 45 tablet, Rfl: 2 .  Ketotifen Fumarate (ALLERGY EYE DROPS OP), Place 1 drop into both eyes daily as needed (allergies)., Disp: , Rfl:  .  losartan (COZAAR) 100 MG tablet, Take 1 tablet (100 mg total) by mouth daily., Disp: 90 tablet, Rfl: 2 .  metFORMIN (GLUCOPHAGE-XR) 500 MG 24 hr tablet, Take 2 tablets (1,000 mg total) by mouth every evening. (Patient taking differently: Take 500 mg by mouth in the morning and at bedtime. ), Disp: 180 tablet, Rfl: 2 .  metoprolol tartrate (LOPRESSOR) 50 MG tablet, Take 1 tablet (50 mg total) by mouth 2 (two) times daily., Disp: 180 tablet, Rfl: 2 .  nitroGLYCERIN (NITROSTAT) 0.4 MG SL tablet, DISSOLVE 1 TAB UNDER TONGUE AT ONSET OF CHEST PAIN. REPEAT EVERY 5 MINUTES FOR 2 DOSES AS NEEDED, Disp: 25 tablet, Rfl: 2 .  Simethicone (GAS-X PO), Take 1 tablet by mouth daily as needed (gas)., Disp: , Rfl:    No Known Allergies Past Medical History:  Diagnosis Date  . Arthritis   .  CAD (coronary artery disease)    a. 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 DES to left PDA 09/20/17 // c. 10/19: LCx and LPDA stents ok; s/p DES to pLAD and POBA to D1  . Diabetes mellitus without complication ([place])    type 2   . Heart attack ([place])    " a blood clot is actually what caused my heart attack in 2018 " , he states he is unsure where the blood clot originated from   . Hyperlipidemia   . Hypertension   . Insomnia   . Pulmonary hypertension ([place])    a. TTE 03/25/17; EF 60-65%, nl WM, LV diastolic fxn nl, trivial AI, RV cavity size, wall thickness, and sys fxn nl, trivial TR, PASP 39 mmHg, trivial pericardial effusion  . SCCA (squamous cell carcinoma) of skin 03/26/2020   Mid Parietal Scalp (well diff)  . Sleep apnea    could not use a cpap  . Wears dentures     full top-partial bottom  . Wears glasses     Past Surgical History:  Procedure Laterality Date  . COLONOSCOPY  05/13/2010   Single anal papilla, 2 diminutive polyps at 10 cm, left-sided diverticula, one sigmoid and one a sending polyp, otherwise normal colon and TI.  Path with 1 tubular adenoma, otherwise hyperplastic polyps.  Recommendations repeat colonoscopy in 7 years.  . COLONOSCOPY WITH PROPOFOL N/A 11/22/2019   Procedure: COLONOSCOPY WITH PROPOFOL;  Surgeon: Daneil Dolin, MD;  [facility];  Service: Endoscopy;  Laterality: N/A;  9:15am  . CORONARY ATHERECTOMY N/A 07/03/2018   Procedure: CORONARY ATHERECTOMY;  Surgeon: Burnell Blanks, MD;  Location: [facility];  Service: Cardiovascular;  Laterality: N/A;  . CORONARY STENT INTERVENTION N/A 09/20/2017   Procedure: CORONARY STENT INTERVENTION;  Surgeon: Leonie Man, MD;  Location: [facility];  Service: Cardiovascular;  Laterality: N/A;  . CORONARY STENT INTERVENTION N/A 07/03/2018   Procedure: CORONARY STENT INTERVENTION;  Surgeon: Burnell Blanks, MD;  Location: [facility];  Service: Cardiovascular;  Laterality: N/A;  . CORONARY/GRAFT ACUTE MI REVASCULARIZATION N/A 03/25/2017   Procedure: Coronary/Graft Acute MI Revascularization;  Surgeon: Sherren Mocha, MD;  Location: [facility];  Service: Cardiovascular;  Laterality: N/A;  . GANGLION CYST EXCISION Left 07/25/2014   Procedure: EXCISION VOLAR AND DORSAL GANGLION LEFT WRIST;  Surgeon: Leanora Cover, MD;  Location: [facility];  Service: Orthopedics;  Laterality: Left;  . INTRAVASCULAR PRESSURE WIRE/FFR STUDY N/A 09/20/2017   Procedure: INTRAVASCULAR PRESSURE WIRE/FFR STUDY;  Surgeon: Leonie Man, MD;  Location: [facility];  Service: Cardiovascular;  Laterality: N/A;  . INTRAVASCULAR PRESSURE WIRE/FFR STUDY N/A 06/30/2018   Procedure: INTRAVASCULAR PRESSURE WIRE/FFR STUDY;  Surgeon: Nelva Bush,  MD;  Location: [facility];  Service: Cardiovascular;  Laterality: N/A;  . KNEE ARTHROSCOPY     rifgr and left  . LEFT HEART CATH AND CORONARY ANGIOGRAPHY N/A 03/25/2017   Procedure: Left Heart Cath and Coronary Angiography;  Surgeon: Sherren Mocha, MD;  Location: [facility];  Service: Cardiovascular;  Laterality: N/A;  . LEFT HEART CATH AND CORONARY ANGIOGRAPHY N/A 09/20/2017   Procedure: LEFT HEART CATH AND CORONARY ANGIOGRAPHY;  Surgeon: Leonie Man, MD;  Location: [facility];  Service: Cardiovascular;  Laterality: N/A;  . LEFT HEART  CATH AND CORONARY ANGIOGRAPHY N/A 06/30/2018   Procedure: LEFT HEART CATH AND CORONARY ANGIOGRAPHY;  Surgeon: Nelva Bush, MD;  Location: [facility];  Service: Cardiovascular;  Laterality: N/A;  . POLYPECTOMY  11/22/2019   Procedure: POLYPECTOMY;  Surgeon: Daneil Dolin, MD;  [facility];  Service: Endoscopy;;  . SHOULDER ARTHROSCOPY  2012   left  . SHOULDER ARTHROSCOPY     right  . TEMPORARY PACEMAKER N/A 03/25/2017   Procedure: Temporary Pacemaker;  Surgeon: Sherren Mocha, MD;  Location: [facility];  Service: Cardiovascular;  Laterality: N/A;  . TONSILLECTOMY    . TOTAL HIP ARTHROPLASTY Right 04/25/2019  . TOTAL HIP ARTHROPLASTY Right 04/25/2019   Procedure: TOTAL HIP ARTHROPLASTY ANTERIOR APPROACH;  Surgeon: Rod Can, MD;  Location: [facility];  Service: Orthopedics;  Laterality: Right;  . TOTAL KNEE ARTHROPLASTY  2002   left    Social History   Socioeconomic History  . Marital status: Married    Spouse name: Not on file  . Number of children: Not on file  . Years of education: Not on file  . Highest education level: Not on file  Occupational History  . Occupation: Retired  Tobacco Use  . Smoking status: Former Smoker    Packs/day: 2.00    Years: 30.00    Pack years: 60.00    Quit date: 03/25/2017    Years since quitting: 3.1  . Smokeless tobacco: Never Used  Vaping Use  .  Vaping Use: Never used  Substance and Sexual Activity  . Alcohol use: Yes    Comment: 3-6 shots of liquor daily  . Drug use: Not Currently    Types: Marijuana    Comment: last used about 10 years ago (08/23/19)  . Sexual activity: Not Currently  Other Topics Concern  . Not on file  Social History Narrative   Per wife, pt was preparing to retire soon from his job at [employer].   Social Determinants of Health   Financial Resource Strain:   . Difficulty of Paying Living Expenses: Not on file  Food Insecurity:   . Worried About Charity fundraiser in the Last Year: Not on file  . Ran Out of Food in the Last Year: Not on file  Transportation Needs:   . Lack of Transportation (Medical): Not on file  . Lack of Transportation (Non-Medical): Not on file  Physical Activity:   . Days of Exercise per Week: Not on file  . Minutes of Exercise per Session: Not on file  Stress:   . Feeling of Stress : Not on file  Social Connections:   . Frequency of Communication with Friends and Family: Not on file  . Frequency of Social Gatherings with Friends and Family: Not on file  . Attends Religious Services: Not on file  . Active Member of Clubs or Organizations: Not on file  . Attends Archivist Meetings: Not on file  . Marital Status: Not on file  Intimate Partner Violence:   . Fear of Current or Ex-Partner: Not on file  . Emotionally Abused: Not on file  . Physically Abused: Not on file  . Sexually Abused: Not on file        Objective:    BP (!) 144/73   Pulse 63   Temp 98.2 F (36.8 C) (Temporal)   Ht _0  (1.753 m)   Wt 207 lb 6.4 oz (94.1 kg)   SpO2 96%   BMI 30.63 kg/m  Wt Readings from Last 3 Encounters:  05/02/20 207 lb 6.4 oz (94.1 kg)  01/31/20 205 lb (93 kg)  11/19/19 210 lb (95.3 kg)    Physical Exam Vitals reviewed.  Constitutional:      General: He is not in acute distress.    Appearance: Normal appearance. He is obese. He is not ill-appearing,  toxic-appearing or diaphoretic.  HENT:     Head: Normocephalic and atraumatic.  Eyes:     General: No scleral icterus.       Right eye: No discharge.        Left eye: No discharge.     Conjunctiva/sclera: Conjunctivae normal.  Cardiovascular:     Rate and Rhythm: Normal rate and regular rhythm.     Heart sounds: Normal heart sounds. No murmur heard.  No friction rub. No gallop.   Pulmonary:     Effort: Pulmonary effort is normal. No respiratory distress.     Breath sounds: Normal breath sounds. No stridor. No wheezing, rhonchi or rales.  Musculoskeletal:        General: Normal range of motion.     Cervical back: Normal range of motion.     Right lower leg: Edema present.     Left lower leg: Edema present.  Skin:    General: Skin is warm and dry.  Neurological:     Mental Status: He is alert and oriented to person, place, and time. Mental status is at baseline.  Psychiatric:        Mood and Affect: Mood normal.        Behavior: Behavior normal.        Thought Content: Thought content normal.        Judgment: Judgment normal.    Diabetic Foot Exam - Simple   Simple Foot Form Diabetic Foot exam was performed with the following findings: Yes 05/02/2020 10:36 AM  Visual Inspection No deformities, no ulcerations, no other skin breakdown bilaterally: Yes Sensation Testing Intact to touch and monofilament testing bilaterally: Yes Pulse Check Posterior Tibialis and Dorsalis pulse intact bilaterally: Yes Comments     No results found for: TSH Lab Results  Component Value Date   WBC 4.6 01/31/2020   HGB 12.2 (L) 01/31/2020   HCT 38.3 01/31/2020   MCV 95 01/31/2020   PLT 173 01/31/2020   Lab Results  Component Value Date   NA 140 01/31/2020   K 4.5 01/31/2020   CO2 22 01/31/2020   GLUCOSE 144 (H) 01/31/2020   BUN 14 01/31/2020   CREATININE 1.11 01/31/2020   BILITOT 0.3 01/31/2020   ALKPHOS 67 01/31/2020   AST 46 (H) 01/31/2020   ALT 39 01/31/2020   PROT 6.5  01/31/2020   ALBUMIN 4.3 01/31/2020   CALCIUM 9.1 01/31/2020   ANIONGAP 7 04/27/2019   Lab Results  Component Value Date   CHOL 131 10/30/2019   Lab Results  Component Value Date   HDL 48 10/30/2019   Lab Results  Component Value Date   LDLCALC 58 10/30/2019   Lab Results  Component Value Date   TRIG 142 10/30/2019   Lab Results  Component Value Date   CHOLHDL 2.7 10/30/2019   Lab Results  Component Value Date   HGBA1C 6.5 01/31/2020

## 2020-05-03 ENCOUNTER — Encounter: Payer: Self-pay | Admitting: Family Medicine

## 2020-05-03 DIAGNOSIS — E538 Deficiency of other specified B group vitamins: Secondary | ICD-10-CM | POA: Insufficient documentation

## 2020-05-03 HISTORY — DX: Deficiency of other specified B group vitamins: E53.8

## 2020-05-03 LAB — ANEMIA PROFILE B
Basophils Absolute: 0 10*3/uL (ref 0.0–0.2)
Basos: 1 %
EOS (ABSOLUTE): 0.1 10*3/uL (ref 0.0–0.4)
Eos: 3 %
Ferritin: 402 ng/mL — ABNORMAL HIGH (ref 30–400)
Folate: 5 ng/mL (ref >3.0)
Hematocrit: 33.2 % — ABNORMAL LOW (ref 37.5–51.0)
Hemoglobin: 10.9 g/dL — ABNORMAL LOW (ref 13.0–17.7)
Immature Grans (Abs): 0 10*3/uL (ref 0.0–0.1)
Immature Granulocytes: 1 %
Iron Saturation: 38 % (ref 15–55)
Iron: 106 ug/dL (ref 38–169)
Lymphocytes Absolute: 1.2 10*3/uL (ref 0.7–3.1)
Lymphs: 34 %
MCH: 30.9 pg (ref 26.6–33.0)
MCHC: 32.8 g/dL (ref 31.5–35.7)
MCV: 94 fL (ref 79–97)
Monocytes Absolute: 0.3 10*3/uL (ref 0.1–0.9)
Monocytes: 7 %
Neutrophils Absolute: 2 10*3/uL (ref 1.4–7.0)
Neutrophils: 54 %
Platelets: 127 10*3/uL — ABNORMAL LOW (ref 150–450)
RBC: 3.53 x10E6/uL — ABNORMAL LOW (ref 4.14–5.80)
RDW: 14.3 % (ref 11.6–15.4)
Retic Ct Pct: 1.9 % (ref 0.6–2.6)
Total Iron Binding Capacity: 278 ug/dL (ref 250–450)
UIBC: 172 ug/dL (ref 111–343)
Vitamin B-12: 221 pg/mL — ABNORMAL LOW (ref 232–1245)
WBC: 3.6 10*3/uL (ref 3.4–10.8)

## 2020-05-03 LAB — CMP14+EGFR
ALT: 66 IU/L — ABNORMAL HIGH (ref 0–44)
AST: 81 IU/L — ABNORMAL HIGH (ref 0–40)
Albumin/Globulin Ratio: 1.8 (ref 1.2–2.2)
Albumin: 4.2 g/dL (ref 3.8–4.8)
Alkaline Phosphatase: 57 IU/L (ref 48–121)
BUN/Creatinine Ratio: 17 (ref 10–24)
BUN: 23 mg/dL (ref 8–27)
Bilirubin Total: 0.3 mg/dL (ref 0.0–1.2)
CO2: 20 mmol/L (ref 20–29)
Calcium: 8.9 mg/dL (ref 8.6–10.2)
Chloride: 103 mmol/L (ref 96–106)
Creatinine, Ser: 1.37 mg/dL — ABNORMAL HIGH (ref 0.76–1.27)
GFR calc Af Amer: 62 mL/min/{1.73_m2} (ref >59)
GFR calc non Af Amer: 54 mL/min/{1.73_m2} — ABNORMAL LOW (ref >59)
Globulin, Total: 2.3 g/dL (ref 1.5–4.5)
Glucose: 132 mg/dL — ABNORMAL HIGH (ref 65–99)
Potassium: 4.5 mmol/L (ref 3.5–5.2)
Sodium: 140 mmol/L (ref 134–144)
Total Protein: 6.5 g/dL (ref 6.0–8.5)

## 2020-05-07 LAB — COMPLIANCE DRUG ANALYSIS, UR

## 2020-05-22 ENCOUNTER — Ambulatory Visit (INDEPENDENT_AMBULATORY_CARE_PROVIDER_SITE_OTHER): Payer: Medicare HMO | Admitting: Dermatology

## 2020-05-22 ENCOUNTER — Other Ambulatory Visit: Payer: Self-pay

## 2020-05-22 ENCOUNTER — Encounter: Payer: Self-pay | Admitting: Dermatology

## 2020-05-22 DIAGNOSIS — C4442 Squamous cell carcinoma of skin of scalp and neck: Secondary | ICD-10-CM

## 2020-05-22 DIAGNOSIS — C4492 Squamous cell carcinoma of skin, unspecified: Secondary | ICD-10-CM

## 2020-05-22 NOTE — Progress Notes (Signed)
Mid parietal scalp  Curet x 3 cautery 61fu treatment size 1.4 cm

## 2020-05-22 NOTE — Patient Instructions (Signed)

## 2020-06-13 DEATH — deceased

## 2020-06-20 ENCOUNTER — Encounter: Payer: Self-pay | Admitting: Dermatology

## 2020-06-20 NOTE — Progress Notes (Signed)
° °  Follow-Up Visit   Subjective  [name] Foster is a 64 y.o. male who presents for the following: Procedure (SCC x 1 Mid Parietal Scalp).  SCCA Location:  Duration:  Quality:  Associated Signs/Symptoms: Modifying Factors:  Severity:  Timing: Context: For treatment2  Objective  Well appearing patient in no apparent distress; mood and affect are within normal limits.  A focused examination was performed including And neck.. Relevant physical exam findings are noted in the Assessment and Plan. head and neck.   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  Destruction of lesion Complexity: simple   Destruction method: electrodesiccation and curettage   Informed consent: discussed and consent obtained   Timeout:  patient name, date of birth, surgical site, and procedure verified Anesthesia: the lesion was anesthetized in a standard fashion   Anesthetic:  1% lidocaine w/ epinephrine 1-100,000 local infiltration Curettage performed in three different directions: Yes   Curettage cycles:  3 Lesion length (cm):  1.4 Lesion width (cm):  1 Margin per side (cm):  0 Final wound size (cm):  1.4 Hemostasis achieved with:  ferric subsulfate Outcome: patient tolerated procedure well with no complications   Post-procedure details: wound care instructions given   Additional details:  Inoculated with parenteral 5% fluorouracil     I, Lavonna Monarch, MD, have reviewed all documentation for this visit.  The documentation on 06/20/20 for the exam, diagnosis, procedures, and orders are all accurate and complete.

## 2020-07-10 NOTE — Progress Notes (Signed)
Cardiology Office Note:    Date:  07/11/2020   ID:  KALEB SEK, DOB 1956/04/24, MRN [id number]  PCP:  [name], [facility] Cardiologist:  Sherren Mocha, MD  / Richardson Dopp, PA-C  [place] Electrophysiologist:  None   Referring MD: [name], FNP   Chief Complaint:  Follow-up (CAD)    Patient Profile:    [name] Foster is a 64 y.o. male with:   Coronary artery disease  ? S/p Inf-Post STEMI 03/2017 >> PCI:  DES to LCx  ? S/p DES to the L PDA in 09/2017 2/2 to [place] ? S/p NSTEMI 06/2018 >> PCI:  Orbital atherectomy and DES to LAD  Diabetes mellitus   Hypertension   Hyperlipidemia   OSA (intol of CPAP)   S/p R THR in 04/2019  Prior CV studies: PCI 07/03/18 Orbital atherectomy + 2.5 x 15 mm Sierra DES to prox LAD  Cardiac Catheterization10/18/19 LAD ost 25, prox 70 (DFR 0.71); D1 90 LCx mid stent patent, dist 30; OM2 60; LPDA ost stent 40 ISR RCA mid 50; AM 50 EF 55-65  Cardiac Catheterization1/8/19 LAD ost 25, prox 65, FFR 0.83 LCx mid stent patent, dist 30, OM2 60, LPDA ost 60, 90 RCA mid 50; AM 50 EF 55-65 PCI: 2.25 x 28 mm Synergy DES to LPDA  Echo 03/25/17 EF 60-65, no RWMA, PASP 39, trivial eff  LHC 03/25/17 LAD prox 50 LCx mid 99; OM1 50 RCA mid 50 PCI: 3.5 x 24 mm Promus Premier DES to mid LCx  Myoview 3/12 EF 65, atten artifact   History of Present Illness:    Mr. Fallert was last seen in 06/2019.  He returns for follow up.  He is here alone.  He is doing well without chest pain, syncope, orthopnea, significant leg swelling.  He has had improved edema since he was placed on HCTZ.  His BPs at home are typically 993T systolic.  He does get short of breath with some activities.  However, he has not had any significant shortness of breath or worsening.        Past Medical History:  Diagnosis Date  . Arthritis   . CAD (coronary artery disease)    a. inferoposterior STEMI 03/25/17: LHC LM calcified w/o  stenosis, pLAD 50%, m-dLCx 99% s/p PCI/DES w/ Promus 3.5 x 24 mm DES, ostial OM1 50%, small RCA with mRCA 50%; b. DES to left PDA 09/20/17 // c. 10/19: LCx and LPDA stents ok; s/p DES to pLAD and POBA to D1  . Diabetes mellitus without complication ([place])    type 2   . Heart attack ([place])    " a blood clot is actually what caused my heart attack in 2018 " , he states he is unsure where the blood clot originated from   . Hyperlipidemia   . Hypertension   . Insomnia   . Pulmonary hypertension ([place])    a. TTE 03/25/17; EF 60-65%, nl WM, LV diastolic fxn nl, trivial AI, RV cavity size, wall thickness, and sys fxn nl, trivial TR, PASP 39 mmHg, trivial pericardial effusion  . SCCA (squamous cell carcinoma) of skin 03/26/2020   Mid Parietal Scalp (well diff)  . Sleep apnea    could not use a cpap  . Vitamin B12 deficiency 05/03/2020  . Wears dentures    full top-partial bottom  . Wears glasses     Current Medications: Current Meds  Medication Sig  . albuterol (VENTOLIN HFA) 108 (  90 Base) MCG/ACT inhaler Inhale 2 puffs into the lungs every 6 (six) hours as needed for wheezing or shortness of breath.  Marland Kitchen amLODipine (NORVASC) 5 MG tablet Take 1 tablet (5 mg total) by mouth daily.  Marland Kitchen aspirin 81 MG chewable tablet Chew 81 mg by mouth daily.  Marland Kitchen atorvastatin (LIPITOR) 40 MG tablet Take 1 tablet (40 mg total) by mouth daily at 6 PM.  . clopidogrel (PLAVIX) 75 MG tablet Take 1 tablet (75 mg total) by mouth daily.  . Coenzyme Q10 (COQ-10) 100 MG CAPS Take 100 mg by mouth daily.  . hydrALAZINE (APRESOLINE) 50 MG tablet Take 1 tablet (50 mg total) by mouth every 8 (eight) hours.  . hydrochlorothiazide (HYDRODIURIL) 12.5 MG tablet TAKE 1 TABLET BY MOUTH EVERY DAY  . HYDROcodone-acetaminophen (NORCO) 7.5-325 MG tablet Take 1 tablet by mouth every 8 (eight) hours.  . isosorbide mononitrate (IMDUR) 30 MG 24 hr tablet Take 0.5 tablets (15 mg total) by mouth daily.  Marland Kitchen losartan (COZAAR) 100 MG tablet Take 1 tablet  (100 mg total) by mouth daily.  . metFORMIN (GLUCOPHAGE) 500 MG tablet Take by mouth 2 (two) times daily with a meal.  . metoprolol tartrate (LOPRESSOR) 50 MG tablet Take 1 tablet (50 mg total) by mouth 2 (two) times daily.  . nitroGLYCERIN (NITROSTAT) 0.4 MG SL tablet DISSOLVE 1 TAB UNDER TONGUE AT ONSET OF CHEST PAIN. REPEAT EVERY 5 MINUTES FOR 2 DOSES AS NEEDED  . RESTASIS 0.05 % ophthalmic emulsion 1 drop daily.  . Simethicone (GAS-X PO) Take 1 tablet by mouth daily as needed (gas).     Allergies:   Patient has no known allergies.   Social History   Tobacco Use  . Smoking status: Former Smoker    Packs/day: 2.00    Years: 30.00    Pack years: 60.00    Quit date: 03/25/2017    Years since quitting: 3.2  . Smokeless tobacco: Never Used  Vaping Use  . Vaping Use: Never used  Substance Use Topics  . Alcohol use: Yes    Comment: 3-6 shots of liquor daily  . Drug use: Not Currently    Types: Marijuana    Comment: last used about 10 years ago (08/23/19)     Family Hx: The patient's family history includes Heart disease in his maternal grandfather; Hypertension in his brother, father, and mother. There is no history of Colon cancer.  Review of Systems  Cardiovascular: Negative for claudication.  Musculoskeletal: Positive for myalgias.     EKGs/Labs/Other Test Reviewed:    EKG:  EKG is   ordered today.  The ekg ordered today demonstrates normal sinus rhythm, HR 69, normal axis, inf Q waves, no ST-TW changes, QTc 441, no changes.   Recent Labs: 05/02/2020: ALT 66; BUN 23; Creatinine, Ser 1.37; Hemoglobin 10.9; Platelets 127; Potassium 4.5; Sodium 140   Recent Lipid Panel Lab Results  Component Value Date/Time   CHOL 131 10/30/2019 10:59 AM   TRIG 142 10/30/2019 10:59 AM   HDL 48 10/30/2019 10:59 AM   CHOLHDL 2.7 10/30/2019 10:59 AM   CHOLHDL 4.4 07/01/2018 03:34 AM   LDLCALC 58 10/30/2019 10:59 AM      Risk Assessment/Calculations:      Physical Exam:    VS:  BP  132/78   Pulse 69   Ht 5\' 9"  (1.753 m)   Wt 208 lb (94.3 kg)   SpO2 96%   BMI 30.72 kg/m     Wt Readings from Last 3  Encounters:  07/11/20 208 lb (94.3 kg)  05/02/20 207 lb 6.4 oz (94.1 kg)  01/31/20 205 lb (93 kg)     Constitutional:      Appearance: Healthy appearance. Not in distress.  Neck:     Thyroid: No thyromegaly.     Vascular: No carotid bruit. JVD normal.  Pulmonary:     Effort: Pulmonary effort is normal.     Breath sounds: No wheezing. No rales.  Cardiovascular:     Normal rate. Regular rhythm. Normal S1. Normal S2.     Murmurs: There is no murmur.  Edema:    Ankle: bilateral trace edema of the ankle. Abdominal:     Palpations: Abdomen is soft. There is no hepatomegaly.  Skin:    General: Skin is warm and dry.  Neurological:     General: No focal deficit present.     Mental Status: Alert and oriented to person, place and time.     Cranial Nerves: Cranial nerves are intact.       ASSESSMENT & PLAN:    1. Coronary artery disease involving native coronary artery of native heart without angina pectoris History of inferoposterior ST elevation myocardial infarction in July 2018 treated with drug-eluting stent to the LCx, DES to the left PDA in January 2019 and non-ST elevation myocardial infarction in October 2019 treated with drug-eluting stent to the LAD.  He is doing well without angina.  He is tolerating DAPT and it is reasonable to continue him on this for now.  Once he is beyond 3 years out from his last PCI, we can consider +/- stopping Clopidogrel.  Continue ASA, Clopidogrel, stating Rx, Losartan, Metoprolol.  FU in 1 year.   2. Type 2 diabetes mellitus without complication Managed by primary care.  Empagliflozin could be considered for CV benefit given the results of the EMPA-REG OUTCOME trial.  3. Essential hypertension Blood pressures are close to goal.  He admits to a diet high in salt.  I have recommended he reduce his salt and this should help his  BP improve more.  Continue current medications.   4. Dyslipidemia, goal LDL below 70 He did have elevated LFTs in 04/2020 with his PCP.  He admits to drinking more alcohol at that time.  I recommended that he reduce his ETOH intake.  He is to have repeat labs in 07/2020 with his PCP.  Of note, he has had some myalgias.  He can hold his Atorvastatin for 1 week to see if this helps.  If it does, he should call me.  I would put him on Rosuvastatin at that time.  If his myalgias do not change, he can resume the Atorvastatin.      Dispo:  Return in about 1 year (around 07/11/2021) for Routine Follow Up, w/ Dr. Burt Knack, or Richardson Dopp, PA-C, in person.   Medication Adjustments/Labs and Tests Ordered: Current medicines are reviewed at length with the patient today.  Concerns regarding medicines are outlined above.  Tests Ordered: Orders Placed This Encounter  Procedures  . EKG 12-Lead   Medication Changes: No orders of the defined types were placed in this encounter.   Signed, Richardson Dopp, PA-C  07/11/2020 1:04 PM    [facility] [address], [place], [place]  [address] Phone: [phone]; Fax: [phone]

## 2020-07-11 ENCOUNTER — Other Ambulatory Visit: Payer: Self-pay

## 2020-07-11 ENCOUNTER — Ambulatory Visit (INDEPENDENT_AMBULATORY_CARE_PROVIDER_SITE_OTHER): Payer: Medicare HMO | Admitting: Physician Assistant

## 2020-07-11 ENCOUNTER — Encounter: Payer: Self-pay | Admitting: Physician Assistant

## 2020-07-11 VITALS — BP 132/78 | HR 69 | Ht 69.0 in | Wt 208.0 lb

## 2020-07-11 DIAGNOSIS — I2510 Atherosclerotic heart disease of native coronary artery without angina pectoris: Secondary | ICD-10-CM | POA: Diagnosis not present

## 2020-07-11 DIAGNOSIS — E119 Type 2 diabetes mellitus without complications: Secondary | ICD-10-CM | POA: Diagnosis not present

## 2020-07-11 DIAGNOSIS — I10 Essential (primary) hypertension: Secondary | ICD-10-CM | POA: Diagnosis not present

## 2020-07-11 DIAGNOSIS — E785 Hyperlipidemia, unspecified: Secondary | ICD-10-CM | POA: Diagnosis not present

## 2020-07-11 NOTE — Patient Instructions (Signed)
Medication Instructions:  Your physician has recommended you make the following change in your medication:   1) Hold Atorvastatin for 1 week, if muscle pain is better off Atorvastatin call the the office. If the muscle pain does not resolve off Atorvastatin restart the medication  *If you need a refill on your cardiac medications before your next appointment, please call your pharmacy*  Lab Work: None ordered today  Testing/Procedures: None ordered today  Follow-Up: At [facility], you and your health needs are our priority.  As part of our continuing mission to provide you with exceptional heart care, we have created designated Provider Care Teams.  These Care Teams include your primary Cardiologist (physician) and Advanced Practice Providers (APPs -  Physician Assistants and Nurse Practitioners) who all work together to provide you with the care you need, when you need it.  Your next appointment:   12 month(s)  The format for your next appointment:   In Person  Provider:   You may see Sherren Mocha, MD or Richardson Dopp, PA-C  Other Instructions  Two Gram Sodium Diet 2000 mg  What is Sodium? Sodium is a mineral found naturally in many foods. The most significant source of sodium in the diet is table salt, which is about 40% sodium.  Processed, convenience, and preserved foods also contain a large amount of sodium.  The body needs only 500 mg of sodium daily to function,  A normal diet provides more than enough sodium even if you do not use salt.  Why Limit Sodium? A build up of sodium in the body can cause thirst, increased blood pressure, shortness of breath, and water retention.  Decreasing sodium in the diet can reduce edema and risk of heart attack or stroke associated with high blood pressure.  Keep in mind that there are many other factors involved in these health problems.  Heredity, obesity, lack of exercise, cigarette smoking, stress and what you eat all play a  role.  General Guidelines:  Do not add salt at the table or in cooking.  One teaspoon of salt contains over 2 grams of sodium.  Read food labels  Avoid processed and convenience foods  Ask your dietitian before eating any foods not dicussed in the menu planning guidelines  Consult your physician if you wish to use a salt substitute or a sodium containing medication such as antacids.  Limit milk and milk products to 16 oz (2 cups) per day.  Shopping Hints:  READ LABELS!! "Dietetic" does not necessarily mean low sodium.  Salt and other sodium ingredients are often added to foods during processing.   Menu Planning Guidelines Food Group Choose More Often Avoid  Beverages (see also the milk group All fruit juices, low-sodium, salt-free vegetables juices, low-sodium carbonated beverages Regular vegetable or tomato juices, commercially softened water used for drinking or cooking  Breads and Cereals Enriched white, wheat, rye and pumpernickel bread, hard rolls and dinner rolls; muffins, cornbread and waffles; most dry cereals, cooked cereal without added salt; unsalted crackers and breadsticks; low sodium or homemade bread crumbs Bread, rolls and crackers with salted tops; quick breads; instant hot cereals; pancakes; commercial bread stuffing; self-rising flower and biscuit mixes; regular bread crumbs or cracker crumbs  Desserts and Sweets Desserts and sweets mad with mild should be within allowance Instant pudding mixes and cake mixes  Fats Butter or margarine; vegetable oils; unsalted salad dressings, regular salad dressings limited to 1 Tbs; light, sour and heavy cream Regular salad dressings containing bacon  fat, bacon bits, and salt pork; snack dips made with instant soup mixes or processed cheese; salted nuts  Fruits Most fresh, frozen and canned fruits Fruits processed with salt or sodium-containing ingredient (some dried fruits are processed with sodium sulfites        Vegetables  Fresh, frozen vegetables and low- sodium canned vegetables Regular canned vegetables, sauerkraut, pickled vegetables, and others prepared in brine; frozen vegetables in sauces; vegetables seasoned with ham, bacon or salt pork  Condiments, Sauces, Miscellaneous  Salt substitute with physician's approval; pepper, herbs, spices; vinegar, lemon or lime juice; hot pepper sauce; garlic powder, onion powder, low sodium soy sauce (1 Tbs.); low sodium condiments (ketchup, chili sauce, mustard) in limited amounts (1 tsp.) fresh ground horseradish; unsalted tortilla chips, pretzels, potato chips, popcorn, salsa (1/4 cup) Any seasoning made with salt including garlic salt, celery salt, onion salt, and seasoned salt; sea salt, rock salt, kosher salt; meat tenderizers; monosodium glutamate; mustard, regular soy sauce, barbecue, sauce, chili sauce, teriyaki sauce, steak sauce, Worcestershire sauce, and most flavored vinegars; canned gravy and mixes; regular condiments; salted snack foods, olives, picles, relish, horseradish sauce, catsup   Food preparation: Try these seasonings Meats:    Pork Sage, onion Serve with applesauce  Chicken Poultry seasoning, thyme, parsley Serve with cranberry sauce  Lamb Curry powder, rosemary, garlic, thyme Serve with mint sauce or jelly  Veal Marjoram, basil Serve with current jelly, cranberry sauce  Beef Pepper, bay leaf Serve with dry mustard, unsalted chive butter  Fish [place] leaf, dill Serve with unsalted lemon butter, unsalted parsley butter  Vegetables:    Asparagus Lemon juice   Broccoli Lemon juice   Carrots Mustard dressing parsley, mint, nutmeg, glazed with unsalted butter and sugar   Green beans Marjoram, lemon juice, nutmeg,dill seed   Tomatoes Basil, marjoram, onion   Spice /blend for [employer]" 4 tsp ground thyme 1 tsp ground sage 3 tsp ground rosemary 4 tsp ground marjoram   Test your knowledge 1. A product that says "Salt Free" may still contain sodium. True  or False 2. Garlic Powder and Hot Pepper Sauce an be used as alternative seasonings.True or False 3. Processed foods have more sodium than fresh foods.  True or False 4. Canned Vegetables have less sodium than froze True or False  WAYS TO DECREASE YOUR SODIUM INTAKE 1. Avoid the use of added salt in cooking and at the table.  Table salt (and other prepared seasonings which contain salt) is probably one of the greatest sources of sodium in the diet.  Unsalted foods can gain flavor from the sweet, sour, and butter taste sensations of herbs and spices.  Instead of using salt for seasoning, try the following seasonings with the foods listed.  Remember: how you use them to enhance natural food flavors is limited only by your creativity... Allspice-Meat, fish, eggs, fruit, peas, red and yellow vegetables Almond Extract-Fruit baked goods Anise Seed-Sweet breads, fruit, carrots, beets, cottage cheese, cookies (tastes like licorice) Basil-Meat, fish, eggs, vegetables, rice, vegetables salads, soups, sauces Bay Leaf-Meat, fish, stews, poultry Burnet-Salad, vegetables (cucumber-like flavor) Caraway Seed-Bread, cookies, cottage cheese, meat, vegetables, cheese, rice Cardamon-Baked goods, fruit, soups Celery Powder or seed-Salads, salad dressings, sauces, meatloaf, soup, bread.Do not use  celery salt Chervil-Meats, salads, fish, eggs, vegetables, cottage cheese (parsley-like flavor) Chili Power-Meatloaf, chicken cheese, corn, eggplant, egg dishes Chives-Salads cottage cheese, egg dishes, soups, vegetables, sauces Cilantro-Salsa, casseroles Cinnamon-Baked goods, fruit, pork, lamb, chicken, carrots Cloves-Fruit, baked goods, fish, pot roast, green beans, beets,  carrots Coriander-Pastry, cookies, meat, salads, cheese (lemon-orange flavor) Cumin-Meatloaf, fish,cheese, eggs, cabbage,fruit pie (caraway flavor) [employer], fruit, eggs, fish, poultry, cottage cheese, vegetables Dill Seed-Meat, cottage  cheese, poultry, vegetables, fish, salads, bread Fennel Seed-Bread, cookies, apples, pork, eggs, fish, beets, cabbage, cheese, Licorice-like flavor Garlic-(buds or powder) Salads, meat, poultry, fish, bread, butter, vegetables, potatoes.Do not  use garlic salt Ginger-Fruit, vegetables, baked goods, meat, fish, poultry Horseradish Root-Meet, vegetables, butter Lemon Juice or Extract-Vegetables, fruit, tea, baked goods, fish salads Mace-Baked goods fruit, vegetables, fish, poultry (taste like nutmeg) Maple Extract-Syrups Marjoram-Meat, chicken, fish, vegetables, breads, green salads (taste like Sage) Mint-Tea, lamb, sherbet, vegetables, desserts, carrots, cabbage Mustard, Dry or Seed-Cheese, eggs, meats, vegetables, poultry Nutmeg-Baked goods, fruit, chicken, eggs, vegetables, desserts Onion Powder-Meat, fish, poultry, vegetables, cheese, eggs, bread, rice salads (Do not use   Onion salt) Orange Extract-Desserts, baked goods Oregano-Pasta, eggs, cheese, onions, pork, lamb, fish, chicken, vegetables, green salads Paprika-Meat, fish, poultry, eggs, cheese, vegetables Parsley Flakes-Butter, vegetables, meat fish, poultry, eggs, bread, salads (certain forms may   Contain sodium Pepper-Meat fish, poultry, vegetables, eggs Peppermint Extract-Desserts, baked goods Poppy Seed-Eggs, bread, cheese, fruit dressings, baked goods, noodles, vegetables, cottage  [employer], poultry, meat, fish, cauliflower, turnips,eggs bread Saffron-Rice, bread, veal, chicken, fish, eggs Sage-Meat, fish, poultry, onions, eggplant, tomateos, pork, stews Savory-Eggs, salads, poultry, meat, rice, vegetables, soups, pork Tarragon-Meat, poultry, fish, eggs, butter, vegetables (licorice-like flavor)  Thyme-Meat, poultry, fish, eggs, vegetables, (clover-like flavor), sauces, soups Tumeric-Salads, butter, eggs, fish, rice, vegetables (saffron-like flavor) Vanilla Extract-Baked goods,  candy Vinegar-Salads, vegetables, meat marinades Walnut Extract-baked goods, candy  2. Choose your Foods Wisely   The following is a list of foods to avoid which are high in sodium:  Meats-Avoid all smoked, canned, salt cured, dried and kosher meat and fish as well as Anchovies   [employer] meats:Bologna, Liverwurst, Pastrami Canned meat or fish  Marinated herring Caviar    Pepperoni Corned Beef   Pizza Dried chipped beef  Salami Frozen breaded fish or meat Salt pork Frankfurters or hot dogs  Sardines Gefilte fish   Sausage Ham (boiled ham, Proscuitto Smoked butt    spiced ham)   Spam      TV Dinners Vegetables Canned vegetables (Regular) Relish Canned mushrooms  Sauerkraut Olives    Tomato juice Pickles  Bakery and Dessert Products Canned puddings  Cream pies Cheesecake   Decorated cakes Cookies  Beverages/Juices Tomato juice, regular  Gatorade   V-8 vegetable juice, regular  Breads and Cereals Biscuit mixes   Salted potato chips, corn chips, pretzels Bread stuffing mixes  Salted crackers and rolls Pancake and waffle mixes Self-rising flour  Seasonings Accent    Meat sauces Barbecue sauce  Meat tenderizer Catsup    Monosodium glutamate (MSG) Celery salt   Onion salt Chili sauce   Prepared mustard Garlic salt   Salt, seasoned salt, sea salt Gravy mixes   Soy sauce Horseradish   Steak sauce Ketchup   Tartar sauce Lite salt    Teriyaki sauce Marinade mixes   Worcestershire sauce  Others Baking powder   Cocoa and cocoa mixes Baking soda   Commercial casserole mixes Candy-caramels, chocolate  Dehydrated soups    Bars, fudge,nougats  Instant rice and pasta mixes Canned broth or soup  Maraschino cherries Cheese, aged and processed cheese and cheese spreads  Learning Assessment Quiz  Indicated T (for True) or F (for False) for each of the following statements:  1. _____ Fresh fruits and vegetables  and unprocessed grains are generally low in  sodium 2. _____ Water may contain a considerable amount of sodium, depending on the source 3. _____ You can always tell if a food is high in sodium by tasting it 4. _____ Certain laxatives my be high in sodium and should be avoided unless prescribed   by a physician or pharmacist 5. _____ Salt substitutes may be used freely by anyone on a sodium restricted diet 6. _____ Sodium is present in table salt, food additives and as a natural component of   most foods 7. _____ Table salt is approximately 90% sodium 8. _____ Limiting sodium intake may help prevent excess fluid accumulation in the body 9. _____ On a sodium-restricted diet, seasonings such as bouillon soy sauce, and    cooking wine should be used in place of table salt 10. _____ On an ingredient list, a product which lists monosodium glutamate as the first   ingredient is an appropriate food to include on a low sodium diet  Circle the best answer(s) to the following statements (Hint: there may be more than one correct answer)  11. On a low-sodium diet, some acceptable snack items are:    A. Olives  F. Bean dip   K. Grapefruit juice    B. Salted Pretzels G. Commercial Popcorn   L. Canned peaches    C. Carrot Sticks  H. Bouillon   M. Unsalted nuts   D. [place] fries  I. Peanut butter crackers N. Salami   E. Sweet pickles J. Tomato Juice   O. Pizza  12.  Seasonings that may be used freely on a reduced - sodium diet include   A. Lemon wedges F.Monosodium glutamate K. Celery seed    B.Soysauce   G. Pepper   L. Mustard powder   C. Sea salt  H. Cooking wine  M. Onion flakes   D. Vinegar  E. Prepared horseradish N. Salsa   E. Sage   J. Worcestershire sauce  O. Chutney

## 2020-07-21 ENCOUNTER — Other Ambulatory Visit: Payer: Self-pay | Admitting: Family Medicine

## 2020-07-21 DIAGNOSIS — E785 Hyperlipidemia, unspecified: Secondary | ICD-10-CM

## 2020-07-23 ENCOUNTER — Other Ambulatory Visit: Payer: Self-pay | Admitting: Family Medicine

## 2020-07-23 DIAGNOSIS — I2510 Atherosclerotic heart disease of native coronary artery without angina pectoris: Secondary | ICD-10-CM

## 2020-08-04 ENCOUNTER — Other Ambulatory Visit: Payer: Self-pay

## 2020-08-04 ENCOUNTER — Encounter: Payer: Self-pay | Admitting: Nurse Practitioner

## 2020-08-04 ENCOUNTER — Ambulatory Visit (INDEPENDENT_AMBULATORY_CARE_PROVIDER_SITE_OTHER): Payer: Medicare HMO | Admitting: Nurse Practitioner

## 2020-08-04 VITALS — BP 134/67 | HR 65 | Temp 98.2°F | Ht 69.0 in | Wt 207.0 lb

## 2020-08-04 DIAGNOSIS — Z79899 Other long term (current) drug therapy: Secondary | ICD-10-CM | POA: Diagnosis not present

## 2020-08-04 DIAGNOSIS — E1129 Type 2 diabetes mellitus with other diabetic kidney complication: Secondary | ICD-10-CM | POA: Diagnosis not present

## 2020-08-04 DIAGNOSIS — G894 Chronic pain syndrome: Secondary | ICD-10-CM | POA: Diagnosis not present

## 2020-08-04 DIAGNOSIS — R809 Proteinuria, unspecified: Secondary | ICD-10-CM | POA: Diagnosis not present

## 2020-08-04 DIAGNOSIS — E785 Hyperlipidemia, unspecified: Secondary | ICD-10-CM

## 2020-08-04 LAB — COMPREHENSIVE METABOLIC PANEL
ALT: 65 IU/L — ABNORMAL HIGH (ref 0–44)
AST: 87 IU/L — ABNORMAL HIGH (ref 0–40)
Albumin/Globulin Ratio: 1.5 (ref 1.2–2.2)
Albumin: 4 g/dL (ref 3.8–4.8)
Alkaline Phosphatase: 54 IU/L (ref 44–121)
BUN/Creatinine Ratio: 11 (ref 10–24)
BUN: 16 mg/dL (ref 8–27)
Bilirubin Total: 0.4 mg/dL (ref 0.0–1.2)
CO2: 23 mmol/L (ref 20–29)
Calcium: 9.2 mg/dL (ref 8.6–10.2)
Chloride: 100 mmol/L (ref 96–106)
Creatinine, Ser: 1.5 mg/dL — ABNORMAL HIGH (ref 0.76–1.27)
GFR calc Af Amer: 56 mL/min/{1.73_m2} — ABNORMAL LOW (ref >59)
GFR calc non Af Amer: 48 mL/min/{1.73_m2} — ABNORMAL LOW (ref >59)
Globulin, Total: 2.6 g/dL (ref 1.5–4.5)
Glucose: 150 mg/dL — ABNORMAL HIGH (ref 65–99)
Potassium: 4.8 mmol/L (ref 3.5–5.2)
Sodium: 141 mmol/L (ref 134–144)
Total Protein: 6.6 g/dL (ref 6.0–8.5)

## 2020-08-04 LAB — LIPID PANEL
Chol/HDL Ratio: 3.3 ratio (ref 0.0–5.0)
Cholesterol, Total: 193 mg/dL (ref 100–199)
HDL: 58 mg/dL (ref >39)
LDL Chol Calc (NIH): 107 mg/dL — ABNORMAL HIGH (ref 0–99)
Triglycerides: 159 mg/dL — ABNORMAL HIGH (ref 0–149)
VLDL Cholesterol Cal: 28 mg/dL (ref 5–40)

## 2020-08-04 LAB — CBC WITH DIFFERENTIAL/PLATELET
Basophils Absolute: 0 10*3/uL (ref 0.0–0.2)
Basos: 1 %
EOS (ABSOLUTE): 0.1 10*3/uL (ref 0.0–0.4)
Eos: 1 %
Hematocrit: 37.6 % (ref 37.5–51.0)
Hemoglobin: 12.7 g/dL — ABNORMAL LOW (ref 13.0–17.7)
Immature Grans (Abs): 0.1 10*3/uL (ref 0.0–0.1)
Immature Granulocytes: 2 %
Lymphocytes Absolute: 1.1 10*3/uL (ref 0.7–3.1)
Lymphs: 30 %
MCH: 30.9 pg (ref 26.6–33.0)
MCHC: 33.8 g/dL (ref 31.5–35.7)
MCV: 92 fL (ref 79–97)
Monocytes Absolute: 0.5 10*3/uL (ref 0.1–0.9)
Monocytes: 13 %
Neutrophils Absolute: 1.9 10*3/uL (ref 1.4–7.0)
Neutrophils: 53 %
Platelets: 153 10*3/uL (ref 150–450)
RBC: 4.11 x10E6/uL — ABNORMAL LOW (ref 4.14–5.80)
RDW: 14 % (ref 11.6–15.4)
WBC: 3.6 10*3/uL (ref 3.4–10.8)

## 2020-08-04 LAB — BAYER DCA HB A1C WAIVED: HB A1C (BAYER DCA - WAIVED): 5.9 % (ref <7.0)

## 2020-08-04 MED ORDER — HYDROCODONE-ACETAMINOPHEN 7.5-325 MG PO TABS: 1.0000 | ORAL_TABLET | Freq: Three times a day (TID) | ORAL | 0 refills | Status: DC

## 2020-08-04 NOTE — Assessment & Plan Note (Signed)
Labs completed-lipid panel.  Results pending. Advised patient to continue on a healthy diet and exercise regimen.  Follow-up in 3 months

## 2020-08-04 NOTE — Assessment & Plan Note (Signed)
Diabetes type 2 well managed on current medication Metformin 500 mg tablet twice daily.  A1c 5.9.  Continue healthy diet and exercise regimen.  Education provided with printed handouts given. Follow-up in 3 months.

## 2020-08-04 NOTE — Progress Notes (Signed)
Established Patient Office Visit  Subjective:  Patient ID: [name] Foster, male    DOB: 03/29/1956  Age: 64 y.o. MRN: [id number]  CC:  Chief Complaint  Patient presents with  . Medical Management of Chronic Issues    HPI [name] Foster presents with history of type 2 diabetes mellitus without complications. Patient was diagnosed in 03/27/2017  Compliance with treatment has been good; the patient takes medication as directed , maintains a diabetic diet and an exercise regimen , follows up as directed , and is keeping a glucose diary. Patient did not bring log to visit. A1c today is 5.9. Patient specifically denies associated symptoms, including blurred vision, fatigue, polydipsia, polyphagia and polyuria . Patient denies hypoglycemia. In regard to preventative care, the patient performs foot self-exams daily and last ophthalmology exam was in: Patient will schedule.  Current medication Metformin 500 mg twice daily.   Pain  He reports chronic back pain. was not an injury that may have caused the pain. The pain started about a year ago and is staying constant. The pain does not radiate . The pain is described as aching, is severe in intensity, occurring constantly. Symptoms are worse in the: morning, mid-day, afternoon, evening  Aggravating factors: Activity Relieving factors: medication Norco.  He has tried prescription pain relievers with significant relief.    Mixed hyperlipidemia  Patient presents with hyperlipidemia follow. Patient was diagnosed in 12/01/2010 Compliance with treatment has been good; The patient is compliant with medications, maintains a low cholesterol diet , follows up as directed , and maintains an exercise regimen . The patient denies experiencing any hypercholesterolemia related symptoms.  Current medication Lipitor 40 mg tablet daily.  ---------------------------------------------------------------------------------------------------      Past Medical  History:  Diagnosis Date  . Arthritis   . CAD (coronary artery disease)    a. 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 DES to left PDA 09/20/17 // c. 10/19: LCx and LPDA stents ok; s/p DES to pLAD and POBA to D1  . Diabetes mellitus without complication ([place])    type 2   . Heart attack ([place])    " a blood clot is actually what caused my heart attack in 2018 " , he states he is unsure where the blood clot originated from   . Hyperlipidemia   . Hypertension   . Insomnia   . Pulmonary hypertension ([place])    a. TTE 03/25/17; EF 60-65%, nl WM, LV diastolic fxn nl, trivial AI, RV cavity size, wall thickness, and sys fxn nl, trivial TR, PASP 39 mmHg, trivial pericardial effusion  . SCCA (squamous cell carcinoma) of skin 03/26/2020   Mid Parietal Scalp (well diff)  . Sleep apnea    could not use a cpap  . Vitamin B12 deficiency 05/03/2020  . Wears dentures    full top-partial bottom  . Wears glasses     Past Surgical History:  Procedure Laterality Date  . COLONOSCOPY  05/13/2010   Single anal papilla, 2 diminutive polyps at 10 cm, left-sided diverticula, one sigmoid and one a sending polyp, otherwise normal colon and TI.  Path with 1 tubular adenoma, otherwise hyperplastic polyps.  Recommendations repeat colonoscopy in 7 years.  . COLONOSCOPY WITH PROPOFOL N/A 11/22/2019   Procedure: COLONOSCOPY WITH PROPOFOL;  Surgeon: Daneil Dolin, MD;  [facility];  Service: Endoscopy;  Laterality:  N/A;  9:15am  . CORONARY ATHERECTOMY N/A 07/03/2018   Procedure: CORONARY ATHERECTOMY;  Surgeon: Burnell Blanks, MD;  Location: [facility];  Service: Cardiovascular;  Laterality: N/A;  . CORONARY STENT INTERVENTION N/A 09/20/2017   Procedure: CORONARY STENT INTERVENTION;  Surgeon: Leonie Man, MD;  Location: [facility];  Service: Cardiovascular;   Laterality: N/A;  . CORONARY STENT INTERVENTION N/A 07/03/2018   Procedure: CORONARY STENT INTERVENTION;  Surgeon: Burnell Blanks, MD;  Location: [facility];  Service: Cardiovascular;  Laterality: N/A;  . CORONARY/GRAFT ACUTE MI REVASCULARIZATION N/A 03/25/2017   Procedure: Coronary/Graft Acute MI Revascularization;  Surgeon: Sherren Mocha, MD;  Location: [facility];  Service: Cardiovascular;  Laterality: N/A;  . GANGLION CYST EXCISION Left 07/25/2014   Procedure: EXCISION VOLAR AND DORSAL GANGLION LEFT WRIST;  Surgeon: Leanora Cover, MD;  Location: [facility];  Service: Orthopedics;  Laterality: Left;  . INTRAVASCULAR PRESSURE WIRE/FFR STUDY N/A 09/20/2017   Procedure: INTRAVASCULAR PRESSURE WIRE/FFR STUDY;  Surgeon: Leonie Man, MD;  Location: [facility];  Service: Cardiovascular;  Laterality: N/A;  . INTRAVASCULAR PRESSURE WIRE/FFR STUDY N/A 06/30/2018   Procedure: INTRAVASCULAR PRESSURE WIRE/FFR STUDY;  Surgeon: Nelva Bush, MD;  Location: [facility];  Service: Cardiovascular;  Laterality: N/A;  . KNEE ARTHROSCOPY     rifgr and left  . LEFT HEART CATH AND CORONARY ANGIOGRAPHY N/A 03/25/2017   Procedure: Left Heart Cath and Coronary Angiography;  Surgeon: Sherren Mocha, MD;  Location: [facility];  Service: Cardiovascular;  Laterality: N/A;  . LEFT HEART CATH AND CORONARY ANGIOGRAPHY N/A 09/20/2017   Procedure: LEFT HEART CATH AND CORONARY ANGIOGRAPHY;  Surgeon: Leonie Man, MD;  Location: [facility];  Service: Cardiovascular;  Laterality: N/A;  . LEFT HEART CATH AND CORONARY ANGIOGRAPHY N/A 06/30/2018   Procedure: LEFT HEART CATH AND CORONARY ANGIOGRAPHY;  Surgeon: Nelva Bush, MD;  Location: [facility];  Service: Cardiovascular;  Laterality: N/A;  . POLYPECTOMY  11/22/2019   Procedure: POLYPECTOMY;  Surgeon: Daneil Dolin, MD;  [facility];  Service: Endoscopy;;  . SHOULDER  ARTHROSCOPY  2012   left  . SHOULDER ARTHROSCOPY     right  . TEMPORARY PACEMAKER N/A 03/25/2017   Procedure: Temporary Pacemaker;  Surgeon: Sherren Mocha, MD;  Location: [facility];  Service: Cardiovascular;  Laterality: N/A;  . TONSILLECTOMY    . TOTAL HIP ARTHROPLASTY Right 04/25/2019  . TOTAL HIP ARTHROPLASTY Right 04/25/2019   Procedure: TOTAL HIP ARTHROPLASTY ANTERIOR APPROACH;  Surgeon: Rod Can, MD;  Location: [facility];  Service: Orthopedics;  Laterality: Right;  . TOTAL KNEE ARTHROPLASTY  2002   left    Family History  Problem Relation Age of Onset  . Hypertension Mother   . Hypertension Father   . Hypertension Brother   . Heart disease Maternal Grandfather   . Colon cancer Neg Hx     Social History   Socioeconomic History  . Marital status: Married    Spouse name: Not on file  . Number of children: Not on file  . Years of education: Not on file  . Highest education level: Not on file  Occupational History  . Occupation: Retired  Tobacco Use  . Smoking status: Former Smoker    Packs/day: 2.00    Years: 30.00    Pack years: 60.00    Quit date: 03/25/2017    Years since quitting: 3.3  .  Smokeless tobacco: Never Used  Vaping Use  . Vaping Use: Never used  Substance and Sexual Activity  . Alcohol use: Yes    Comment: 3-6 shots of liquor daily  . Drug use: Not Currently    Types: Marijuana    Comment: last used about 10 years ago (08/23/19)  . Sexual activity: Not Currently  Other Topics Concern  . Not on file  Social History Narrative   Per wife, pt was preparing to retire soon from his job at [employer].   Social Determinants of Health   Financial Resource Strain:   . Difficulty of Paying Living Expenses: Not on file  Food Insecurity:   . Worried About Charity fundraiser in the Last Year: Not on file  . Ran Out of Food in the Last Year: Not on file  Transportation Needs:   . Lack of Transportation (Medical): Not on file  . Lack of  Transportation (Non-Medical): Not on file  Physical Activity:   . Days of Exercise per Week: Not on file  . Minutes of Exercise per Session: Not on file  Stress:   . Feeling of Stress : Not on file  Social Connections:   . Frequency of Communication with Friends and Family: Not on file  . Frequency of Social Gatherings with Friends and Family: Not on file  . Attends Religious Services: Not on file  . Active Member of Clubs or Organizations: Not on file  . Attends Archivist Meetings: Not on file  . Marital Status: Not on file  Intimate Partner Violence:   . Fear of Current or Ex-Partner: Not on file  . Emotionally Abused: Not on file  . Physically Abused: Not on file  . Sexually Abused: Not on file    Outpatient Medications Prior to Visit  Medication Sig Dispense Refill  . amLODipine (NORVASC) 5 MG tablet Take 1 tablet (5 mg total) by mouth daily. 90 tablet 2  . aspirin 81 MG chewable tablet Chew 81 mg by mouth daily.    . clopidogrel (PLAVIX) 75 MG tablet TAKE 1 TABLET BY MOUTH EVERY DAY 90 tablet 0  . Coenzyme Q10 (COQ-10) 100 MG CAPS Take 100 mg by mouth daily.    . hydrALAZINE (APRESOLINE) 50 MG tablet Take 1 tablet (50 mg total) by mouth every 8 (eight) hours. 270 tablet 2  . hydrochlorothiazide (HYDRODIURIL) 12.5 MG tablet TAKE 1 TABLET BY MOUTH EVERY DAY 90 tablet 0  . HYDROcodone-acetaminophen (NORCO) 7.5-325 MG tablet Take 1 tablet by mouth every 8 (eight) hours. 90 tablet 0  . isosorbide mononitrate (IMDUR) 30 MG 24 hr tablet Take 0.5 tablets (15 mg total) by mouth daily. 45 tablet 2  . losartan (COZAAR) 100 MG tablet Take 1 tablet (100 mg total) by mouth daily. 90 tablet 2  . metFORMIN (GLUCOPHAGE) 500 MG tablet Take by mouth 2 (two) times daily with a meal.    . metoprolol tartrate (LOPRESSOR) 50 MG tablet Take 1 tablet (50 mg total) by mouth 2 (two) times daily. 180 tablet 2  . nitroGLYCERIN (NITROSTAT) 0.4 MG SL tablet DISSOLVE 1 TAB UNDER TONGUE AT ONSET OF  CHEST PAIN. REPEAT EVERY 5 MINUTES FOR 2 DOSES AS NEEDED 25 tablet 2  . RESTASIS 0.05 % ophthalmic emulsion 1 drop daily.    . Simethicone (GAS-X PO) Take 1 tablet by mouth daily as needed (gas).    Marland Kitchen albuterol (VENTOLIN HFA) 108 (90 Base) MCG/ACT inhaler Inhale 2 puffs into the lungs every 6 (  six) hours as needed for wheezing or shortness of breath. (Patient not taking: Reported on 08/04/2020) 18 g 2  . atorvastatin (LIPITOR) 40 MG tablet TAKE 1 TABLET (40 MG TOTAL) BY MOUTH DAILY AT 6 PM. (Patient not taking: Reported on 08/04/2020) 90 tablet 0   No facility-administered medications prior to visit.    No Known Allergies  ROS Review of Systems  Musculoskeletal: Positive for back pain.  Psychiatric/Behavioral: The patient is not nervous/anxious.   All other systems reviewed and are negative.     Objective:    Physical Exam Vitals reviewed.  Constitutional:      Appearance: Normal appearance. He is normal weight.  HENT:     Head: Normocephalic.     Nose: Nose normal.  Eyes:     Conjunctiva/sclera: Conjunctivae normal.  Cardiovascular:     Rate and Rhythm: Normal rate.     Pulses: Normal pulses.     Heart sounds: Normal heart sounds.  Pulmonary:     Effort: Pulmonary effort is normal.     Breath sounds: Normal breath sounds.  Abdominal:     General: Bowel sounds are normal.  Musculoskeletal:        General: Tenderness present.     Comments: Back pain  Skin:    General: Skin is warm.  Neurological:     Mental Status: He is alert and oriented to person, place, and time.  Psychiatric:        Mood and Affect: Mood normal.        Behavior: Behavior normal.     BP 134/67   Pulse 65   Temp 98.2 F (36.8 C) (Temporal)   Ht 5\' 9"  (1.753 m)   Wt 207 lb (93.9 kg)   SpO2 95%   BMI 30.57 kg/m  Wt Readings from Last 3 Encounters:  08/04/20 207 lb (93.9 kg)  07/11/20 208 lb (94.3 kg)  05/02/20 207 lb 6.4 oz (94.1 kg)     Health Maintenance Due  Topic Date Due  .  OPHTHALMOLOGY EXAM  11/20/2019    There are no preventive care reminders to display for this patient.  No results found for: TSH Lab Results  Component Value Date   WBC 3.6 05/02/2020   HGB 10.9 (L) 05/02/2020   HCT 33.2 (L) 05/02/2020   MCV 94 05/02/2020   PLT 127 (L) 05/02/2020   Lab Results  Component Value Date   NA 140 05/02/2020   K 4.5 05/02/2020   CO2 20 05/02/2020   GLUCOSE 132 (H) 05/02/2020   BUN 23 05/02/2020   CREATININE 1.37 (H) 05/02/2020   BILITOT 0.3 05/02/2020   ALKPHOS 57 05/02/2020   AST 81 (H) 05/02/2020   ALT 66 (H) 05/02/2020   PROT 6.5 05/02/2020   ALBUMIN 4.2 05/02/2020   CALCIUM 8.9 05/02/2020   ANIONGAP 7 04/27/2019   Lab Results  Component Value Date   CHOL 131 10/30/2019   Lab Results  Component Value Date   HDL 48 10/30/2019   Lab Results  Component Value Date   LDLCALC 58 10/30/2019   Lab Results  Component Value Date   TRIG 142 10/30/2019   Lab Results  Component Value Date   CHOLHDL 2.7 10/30/2019   Lab Results  Component Value Date   HGBA1C 5.9 08/04/2020      Assessment & Plan:   Problem List Items Addressed This Visit      Endocrine   Type 2 diabetes mellitus with microalbuminuria, without long-term current use  of insulin ([place]) - Primary    Diabetes type 2 well managed on current medication Metformin 500 mg tablet twice daily.  A1c 5.9.  Continue healthy diet and exercise regimen.  Education provided with printed handouts given. Follow-up in 3 months.      Relevant Orders   Bayer DCA Hb A1c Waived (Completed)   CBC with Differential   Comprehensive metabolic panel     Other   Dyslipidemia, goal LDL below 70 (Chronic)    Labs completed-lipid panel.  Results pending. Advised patient to continue on a healthy diet and exercise regimen.  Follow-up in 3 months      Relevant Orders   Lipid Panel   Controlled substance agreement signed   Relevant Medications   HYDROcodone-acetaminophen (NORCO) 7.5-325 MG  tablet (Start on 09/02/2020)   HYDROcodone-acetaminophen (NORCO) 7.5-325 MG tablet (Start on 10/02/2020)   Chronic pain syndrome    Chronic pain syndrome [low back pain] well-managed on current medication no changes necessary. Rx refill sent to pharmacy Provided education with printed handouts given.  Follow-up in 3 months.      Relevant Medications   HYDROcodone-acetaminophen (NORCO) 7.5-325 MG tablet (Start on 09/02/2020)   HYDROcodone-acetaminophen ([place]) 7.5-325 MG tablet (Start on 10/02/2020)      Meds ordered this encounter  Medications  . DISCONTD: HYDROcodone-acetaminophen (NORCO) 7.5-325 MG tablet    Sig: Take 1 tablet by mouth every 8 (eight) hours.    Dispense:  90 tablet    Refill:  0    Order Specific Question:   Supervising Provider    Answer:   Caryl Pina A [id number]  . HYDROcodone-acetaminophen (NORCO) 7.5-325 MG tablet    Sig: Take 1 tablet by mouth every 8 (eight) hours.    Dispense:  90 tablet    Refill:  0    Order Specific Question:   Supervising Provider    Answer:   Caryl Pina A [id number]  . HYDROcodone-acetaminophen (NORCO) 7.5-325 MG tablet    Sig: Take 1 tablet by mouth every 8 (eight) hours.    Dispense:  90 tablet    Refill:  0    Order Specific Question:   Supervising Provider    Answer:   Caryl Pina A [[id number]]    Follow-up: Return in about 3 months (around 11/04/2020).    Ivy Lynn, NP

## 2020-08-04 NOTE — Patient Instructions (Signed)
Patient is dong well managed on current medication no changes to doses, refill sent to pharmacy, follow up in 3 month  Cholesterol Content in Foods Cholesterol is a waxy, fat-like substance that helps to carry fat in the blood. The body needs cholesterol in small amounts, but too much cholesterol can cause damage to the arteries and heart. Most people should eat less than 200 milligrams (mg) of cholesterol a day. Foods with cholesterol  Cholesterol is found in animal-based foods, such as meat, seafood, and dairy. Generally, low-fat dairy and lean meats have less cholesterol than full-fat dairy and fatty meats. The milligrams of cholesterol per serving (mg per serving) of common cholesterol-containing foods are listed below. Meat and other proteins  Egg -- one large whole egg has 186 mg.  Veal shank -- 4 oz has 141 mg.  Lean ground [place] (93% lean) -- 4 oz has 118 mg.  Fat-trimmed lamb loin -- 4 oz has 106 mg.  Lean ground beef (90% lean) -- 4 oz has 100 mg.  Lobster -- 3.5 oz has 90 mg.  Pork loin chops -- 4 oz has 86 mg.  Canned salmon -- 3.5 oz has 83 mg.  Fat-trimmed beef top loin -- 4 oz has 78 mg.  Frankfurter -- 1 frank (3.5 oz) has 77 mg.  Crab -- 3.5 oz has 71 mg.  Roasted chicken without skin, white meat -- 4 oz has 66 mg.  Light bologna -- 2 oz has 45 mg.  Deli-cut [place] -- 2 oz has 31 mg.  Canned tuna -- 3.5 oz has 31 mg.  Berniece Salines -- 1 oz has 29 mg.  Oysters and mussels (raw) -- 3.5 oz has 25 mg.  Mackerel -- 1 oz has 22 mg.  Trout -- 1 oz has 20 mg.  Pork sausage -- 1 link (1 oz) has 17 mg.  Salmon -- 1 oz has 16 mg.  Tilapia -- 1 oz has 14 mg. Dairy  Soft-serve ice cream --  cup (4 oz) has 103 mg.  Whole-milk yogurt -- 1 cup (8 oz) has 29 mg.  Cheddar cheese -- 1 oz has 28 mg.  American cheese -- 1 oz has 28 mg.  Whole milk -- 1 cup (8 oz) has 23 mg.  2% milk -- 1 cup (8 oz) has 18 mg.  Cream cheese -- 1 tablespoon (Tbsp) has 15  mg.  Cottage cheese --  cup (4 oz) has 14 mg.  Low-fat (1%) milk -- 1 cup (8 oz) has 10 mg.  Sour cream -- 1 Tbsp has 8.5 mg.  Low-fat yogurt -- 1 cup (8 oz) has 8 mg.  Nonfat Greek yogurt -- 1 cup (8 oz) has 7 mg.  Half-and-half cream -- 1 Tbsp has 5 mg. Fats and oils  Cod liver oil -- 1 tablespoon (Tbsp) has 82 mg.  Butter -- 1 Tbsp has 15 mg.  Lard -- 1 Tbsp has 14 mg.  Bacon grease -- 1 Tbsp has 14 mg.  Mayonnaise -- 1 Tbsp has 5-10 mg.  Margarine -- 1 Tbsp has 3-10 mg. Exact amounts of cholesterol in these foods may vary depending on specific ingredients and brands. Foods without cholesterol Most plant-based foods do not have cholesterol unless you combine them with a food that has cholesterol. Foods without cholesterol include:  Grains and cereals.  Vegetables.  Fruits.  Vegetable oils, such as olive, canola, and sunflower oil.  Legumes, such as peas, beans, and lentils.  Nuts and seeds.  Egg whites.  Summary  The body needs cholesterol in small amounts, but too much cholesterol can cause damage to the arteries and heart.  Most people should eat less than 200 milligrams (mg) of cholesterol a day. This information is not intended to replace advice given to you by your health care provider. Make sure you discuss any questions you have with your health care provider. Document Revised: 08/12/2017 Document Reviewed: 04/26/2017 Elsevier Patient Education  [place]. Chronic Pain, Adult Chronic pain is a type of pain that lasts or keeps coming back (recurs) for at least six months. You may have chronic headaches, abdominal pain, or body pain. Chronic pain may be related to an illness, such as fibromyalgia or complex regional pain syndrome. Sometimes the cause of chronic pain is not known. Chronic pain can make it hard for you to do daily activities. If not treated, chronic pain can lead to other health problems, including anxiety and depression. Treatment  depends on the cause and severity of your pain. You may need to work with a pain specialist to come up with a treatment plan. The plan may include medicine, counseling, and physical therapy. Many people benefit from a combination of two or more types of treatment to control their pain. Follow these instructions at home: Lifestyle  Consider keeping a pain diary to share with your health care providers.  Consider talking with a mental health care provider (psychologist) about how to cope with chronic pain.  Consider joining a chronic pain support group.  Try to control or lower your stress levels. Talk to your health care provider about strategies to do this. General instructions   Take over-the-counter and prescription medicines only as told by your health care provider.  Follow your treatment plan as told by your health care provider. This may include: ? Gentle, regular exercise. ? Eating a healthy diet that includes foods such as vegetables, fruits, fish, and lean meats. ? Cognitive or behavioral therapy. ? Working with a Community education officer. ? Meditation or yoga. ? Acupuncture or massage therapy. ? Aroma, color, light, or sound therapy. ? Local electrical stimulation. ? Shots (injections) of numbing or pain-relieving medicines into the spine or the area of pain.  Check your pain level as told by your health care provider. Ask your health care provider if you should use a pain scale.  Learn as much as you can about how to manage your chronic pain. Ask your health care provider if an intensive pain rehabilitation program or a chronic pain specialist would be helpful.  Keep all follow-up visits as told by your health care provider. This is important. Contact a health care provider if:  Your pain gets worse.  You have new pain.  You have trouble sleeping.  You have trouble doing your normal activities.  Your pain is not controlled with treatment.  Your have side effects from  pain medicine.  You feel weak. Get help right away if:  You lose feeling or have numbness in your body.  You lose control of bowel or bladder function.  Your pain suddenly gets much worse.  You develop shaking or chills.  You develop confusion.  You develop chest pain.  You have trouble breathing or shortness of breath.  You pass out.  You have thoughts about hurting yourself or others. This information is not intended to replace advice given to you by your health care provider. Make sure you discuss any questions you have with your health care provider. Document Revised: 08/12/2017 Document  Reviewed: 02/17/2016 Elsevier Patient Education  [place]. Hypertension, Adult Hypertension is another name for high blood pressure. High blood pressure forces your heart to work harder to pump blood. This can cause problems over time. There are two numbers in a blood pressure reading. There is a top number (systolic) over a bottom number (diastolic). It is best to have a blood pressure that is below 120/80. Healthy choices can help lower your blood pressure, or you may need medicine to help lower it. What are the causes? The cause of this condition is not known. Some conditions may be related to high blood pressure. What increases the risk?  Smoking.  Having type 2 diabetes mellitus, high cholesterol, or both.  Not getting enough exercise or physical activity.  Being overweight.  Having too much fat, sugar, calories, or salt (sodium) in your diet.  Drinking too much alcohol.  Having long-term (chronic) kidney disease.  Having a family history of high blood pressure.  Age. Risk increases with age.  Race. You may be at higher risk if you are African American.  Gender. Men are at higher risk than women before age 40. After age 48, women are at higher risk than men.  Having obstructive sleep apnea.  Stress. What are the signs or symptoms?  High blood pressure may  not cause symptoms. Very high blood pressure (hypertensive crisis) may cause: ? Headache. ? Feelings of worry or nervousness (anxiety). ? Shortness of breath. ? Nosebleed. ? A feeling of being sick to your stomach (nausea). ? Throwing up (vomiting). ? Changes in how you see. ? Very bad chest pain. ? Seizures. How is this treated?  This condition is treated by making healthy lifestyle changes, such as: ? Eating healthy foods. ? Exercising more. ? Drinking less alcohol.  Your health care provider may prescribe medicine if lifestyle changes are not enough to get your blood pressure under control, and if: ? Your top number is above 130. ? Your bottom number is above 80.  Your personal target blood pressure may vary. Follow these instructions at home: Eating and drinking   If told, follow the DASH eating plan. To follow this plan: ? Fill one half of your plate at each meal with fruits and vegetables. ? Fill one fourth of your plate at each meal with whole grains. Whole grains include whole-wheat pasta, brown rice, and whole-grain bread. ? Eat or drink low-fat dairy products, such as skim milk or low-fat yogurt. ? Fill one fourth of your plate at each meal with low-fat (lean) proteins. Low-fat proteins include fish, chicken without skin, eggs, beans, and tofu. ? Avoid fatty meat, cured and processed meat, or chicken with skin. ? Avoid pre-made or processed food.  Eat less than 1,500 mg of salt each day.  Do not drink alcohol if: ? Your doctor tells you not to drink. ? You are pregnant, may be pregnant, or are planning to become pregnant.  If you drink alcohol: ? Limit how much you use to:  0-1 drink a day for women.  0-2 drinks a day for men. ? Be aware of how much alcohol is in your drink. In the [place] one drink equals one 12 oz bottle of beer (355 mL), one 5 oz glass of wine (148 mL), or one 1 oz glass of hard liquor (44 mL). Lifestyle   Work with your doctor to stay at  a healthy weight or to lose weight. Ask your doctor what the best weight is for  you.  Get at least 30 minutes of exercise most days of the week. This may include walking, swimming, or biking.  Get at least 30 minutes of exercise that strengthens your muscles (resistance exercise) at least 3 days a week. This may include lifting weights or doing Pilates.  Do not use any products that contain nicotine or tobacco, such as cigarettes, e-cigarettes, and chewing tobacco. If you need help quitting, ask your doctor.  Check your blood pressure at home as told by your doctor.  Keep all follow-up visits as told by your doctor. This is important. Medicines  Take over-the-counter and prescription medicines only as told by your doctor. Follow directions carefully.  Do not skip doses of blood pressure medicine. The medicine does not work as well if you skip doses. Skipping doses also puts you at risk for problems.  Ask your doctor about side effects or reactions to medicines that you should watch for. Contact a doctor if you:  Think you are having a reaction to the medicine you are taking.  Have headaches that keep coming back (recurring).  Feel dizzy.  Have swelling in your ankles.  Have trouble with your vision. Get help right away if you:  Get a very bad headache.  Start to feel mixed up (confused).  Feel weak or numb.  Feel faint.  Have very bad pain in your: ? Chest. ? Belly (abdomen).  Throw up more than once.  Have trouble breathing. Summary  Hypertension is another name for high blood pressure.  High blood pressure forces your heart to work harder to pump blood.  For most people, a normal blood pressure is less than 120/80.  Making healthy choices can help lower blood pressure. If your blood pressure does not get lower with healthy choices, you may need to take medicine. This information is not intended to replace advice given to you by your health care provider. Make  sure you discuss any questions you have with your health care provider. Document Revised: 05/10/2018 Document Reviewed: 05/10/2018 Elsevier Patient Education  2020 [employer].

## 2020-08-04 NOTE — Assessment & Plan Note (Signed)
Chronic pain syndrome [low back pain] well-managed on current medication no changes necessary. Rx refill sent to pharmacy Provided education with printed handouts given.  Follow-up in 3 months.

## 2020-08-25 ENCOUNTER — Other Ambulatory Visit: Payer: Self-pay | Admitting: Family Medicine

## 2020-08-25 DIAGNOSIS — I2510 Atherosclerotic heart disease of native coronary artery without angina pectoris: Secondary | ICD-10-CM

## 2020-08-25 DIAGNOSIS — I10 Essential (primary) hypertension: Secondary | ICD-10-CM

## 2020-09-13 ENCOUNTER — Other Ambulatory Visit: Payer: Self-pay | Admitting: Family Medicine

## 2020-09-13 DIAGNOSIS — I10 Essential (primary) hypertension: Secondary | ICD-10-CM

## 2020-09-15 ENCOUNTER — Other Ambulatory Visit: Payer: Self-pay | Admitting: Family Medicine

## 2020-09-15 DIAGNOSIS — I2510 Atherosclerotic heart disease of native coronary artery without angina pectoris: Secondary | ICD-10-CM

## 2020-09-15 DIAGNOSIS — I10 Essential (primary) hypertension: Secondary | ICD-10-CM

## 2020-09-15 DIAGNOSIS — R809 Proteinuria, unspecified: Secondary | ICD-10-CM

## 2020-09-15 DIAGNOSIS — E785 Hyperlipidemia, unspecified: Secondary | ICD-10-CM

## 2020-09-15 DIAGNOSIS — R600 Localized edema: Secondary | ICD-10-CM

## 2020-09-15 DIAGNOSIS — E1129 Type 2 diabetes mellitus with other diabetic kidney complication: Secondary | ICD-10-CM

## 2020-10-02 ENCOUNTER — Other Ambulatory Visit: Payer: Self-pay | Admitting: Family Medicine

## 2020-10-02 DIAGNOSIS — I10 Essential (primary) hypertension: Secondary | ICD-10-CM

## 2020-10-02 DIAGNOSIS — I2510 Atherosclerotic heart disease of native coronary artery without angina pectoris: Secondary | ICD-10-CM

## 2020-10-06 ENCOUNTER — Telehealth: Payer: Self-pay

## 2020-10-06 DIAGNOSIS — E785 Hyperlipidemia, unspecified: Secondary | ICD-10-CM

## 2020-10-06 NOTE — Telephone Encounter (Signed)
Pt needs someone to put in lab order for him to have his lipid panel rechecked. (see last lab result notes per [facility])  Pt wants to be called when order has been put in.

## 2020-10-06 NOTE — Telephone Encounter (Signed)
Future lipids order placed and pt is aware.

## 2020-10-07 ENCOUNTER — Other Ambulatory Visit: Payer: Medicare Other

## 2020-10-07 ENCOUNTER — Other Ambulatory Visit: Payer: Self-pay

## 2020-10-07 DIAGNOSIS — E785 Hyperlipidemia, unspecified: Secondary | ICD-10-CM

## 2020-10-08 LAB — LIPID PANEL
Chol/HDL Ratio: 2.6 ratio (ref 0.0–5.0)
Cholesterol, Total: 133 mg/dL (ref 100–199)
HDL: 51 mg/dL (ref >39)
LDL Chol Calc (NIH): 58 mg/dL (ref 0–99)
Triglycerides: 138 mg/dL (ref 0–149)
VLDL Cholesterol Cal: 24 mg/dL (ref 5–40)

## 2020-11-04 ENCOUNTER — Other Ambulatory Visit: Payer: Self-pay

## 2020-11-04 ENCOUNTER — Encounter: Payer: Self-pay | Admitting: Family Medicine

## 2020-11-04 ENCOUNTER — Ambulatory Visit (INDEPENDENT_AMBULATORY_CARE_PROVIDER_SITE_OTHER): Payer: Medicare Other | Admitting: Family Medicine

## 2020-11-04 VITALS — BP 150/72 | HR 77 | Temp 98.2°F | Ht 69.0 in | Wt 207.8 lb

## 2020-11-04 DIAGNOSIS — E785 Hyperlipidemia, unspecified: Secondary | ICD-10-CM | POA: Diagnosis not present

## 2020-11-04 DIAGNOSIS — I10 Essential (primary) hypertension: Secondary | ICD-10-CM | POA: Diagnosis not present

## 2020-11-04 DIAGNOSIS — I2510 Atherosclerotic heart disease of native coronary artery without angina pectoris: Secondary | ICD-10-CM | POA: Diagnosis not present

## 2020-11-04 DIAGNOSIS — G894 Chronic pain syndrome: Secondary | ICD-10-CM

## 2020-11-04 DIAGNOSIS — E1129 Type 2 diabetes mellitus with other diabetic kidney complication: Secondary | ICD-10-CM

## 2020-11-04 DIAGNOSIS — R809 Proteinuria, unspecified: Secondary | ICD-10-CM

## 2020-11-04 DIAGNOSIS — Z79899 Other long term (current) drug therapy: Secondary | ICD-10-CM

## 2020-11-04 DIAGNOSIS — D519 Vitamin B12 deficiency anemia, unspecified: Secondary | ICD-10-CM

## 2020-11-04 LAB — BAYER DCA HB A1C WAIVED: HB A1C (BAYER DCA - WAIVED): 5.8 % (ref <7.0)

## 2020-11-04 MED ORDER — HYDROCODONE-ACETAMINOPHEN 7.5-325 MG PO TABS
1.0000 | ORAL_TABLET | Freq: Three times a day (TID) | ORAL | 0 refills | Status: DC
Start: 2020-11-04 — End: 2021-02-03

## 2020-11-04 MED ORDER — HYDROCODONE-ACETAMINOPHEN 7.5-325 MG PO TABS: 1.0000 | ORAL_TABLET | Freq: Three times a day (TID) | ORAL | 0 refills | Status: DC

## 2020-11-04 MED ORDER — EMPAGLIFLOZIN 25 MG PO TABS: 25.0000 mg | ORAL_TABLET | Freq: Every day | ORAL | 2 refills | Status: DC

## 2020-11-04 NOTE — Patient Instructions (Signed)
Stop Metformin. Start Fellows.

## 2020-11-04 NOTE — Progress Notes (Signed)
Assessment & Plan:  1. Type 2 diabetes mellitus with microalbuminuria, without long-term current use of insulin (HCC) Lab Results  Component Value Date   HGBA1C 5.8 11/04/2020   HGBA1C 5.9 08/04/2020   HGBA1C 6.8 05/02/2020    - Diabetes is at goal of A1c < 7. - Medications: D/C metformin. Rx'd Jardiance for additional CV benefits - Home glucose monitoring: continue monitoring - Patient is currently taking a statin. Patient is taking an ACE-inhibitor/ARB.   Diabetes Health Maintenance Due  Topic Date Due  . OPHTHALMOLOGY EXAM  11/20/2019  . FOOT EXAM  05/02/2021  . HEMOGLOBIN A1C  05/04/2021    Lab Results  Component Value Date   LABMICR 630.7 07/19/2019   - Lipid panel - CMP14+EGFR - CBC with Differential/Platelet - Bayer DCA Hb A1c Waived - empagliflozin (JARDIANCE) 25 MG TABS tablet; Take 1 tablet (25 mg total) by mouth daily before breakfast.  Dispense: 30 tablet; Refill: 2  2. Essential hypertension Well controlled on current regimen.  - Lipid panel - CMP14+EGFR - CBC with Differential/Platelet  3. Coronary artery disease involving native coronary artery of native heart without angina pectoris On statin. - Lipid panel - CMP14+EGFR  4. Dyslipidemia, goal LDL below 70 Labs to assess. On statin. - Lipid panel - CMP14+EGFR  5-6. Chronic pain syndrome/Controlled substance agreement signed Well controlled on current regimen.  Controlled substance agreement in place.  Urine drug screen up-to-date and as expected.  PDMP reviewed with no concerning findings. - CMP14+EGFR - HYDROcodone-acetaminophen (NORCO) 7.5-325 MG tablet; Take 1 tablet by mouth every 8 (eight) hours.  Dispense: 90 tablet; Refill: 0 - HYDROcodone-acetaminophen (NORCO) 7.5-325 MG tablet; Take 1 tablet by mouth every 8 (eight) hours.  Dispense: 90 tablet; Refill: 0 - HYDROcodone-acetaminophen (NORCO) 7.5-325 MG tablet; Take 1 tablet by mouth every 8 (eight) hours.  Dispense: 90 tablet; Refill:  0  7. Anemia due to vitamin B12 deficiency, unspecified B12 deficiency type Labs to assess. - CBC with Differential/Platelet   Return in about 3 months (around 02/01/2021) for follow-up of chronic medication conditions.  [name], MSN, APRN, FNP-C [facility]  Subjective:    Patient ID: OTHEL HOOGENDOORN, male    DOB: [birth year], 65 y.o.   MRN: [id number]  Patient Care Team: [name], FNP as PCP - General (Family Medicine) Sherren Mocha, MD as PCP - Cardiology (Cardiology) [facility] as Consulting Physician (Orthopedic Surgery) Lavonna Monarch, MD as Consulting Physician (Dermatology) Sharmon Revere as Physician Assistant (Cardiology)   Chief Complaint:  Chief Complaint  Patient presents with  . Diabetes    3 month of chronic medical conditions     HPI: IZAIHA LO is a 65 y.o. male presenting on 11/04/2020 for Diabetes (3 month of chronic medical conditions/)  Diabetes: Patient presents for follow up of diabetes. Current symptoms include: none. Known diabetic complications: none. Medication compliance: yes. Current diet: in general, a "healthy" diet  . Current exercise: none. Home blood sugar records: BGs are running  consistent with Hgb A1C. Is he  on ACE inhibitor or angiotensin II receptor blocker? Yes. Is he on a statin? Yes.   Pain assessment: Cause of pain- severe arthritis Pain location- back Pain on scale of 1-10- 8/10 without medication; 4/10 with medication Frequency- daily What increases pain- movement, ADLs, awkward positions What makes pain better- rest, pain medication, heat, position changes Effects on ADL- getting dressed is more difficult Any change in general medical condition- no  Current opioids rx-  Norco 7.5/325 Q8H PRN # meds rx- 90 Effectiveness of current meds- working well Adverse reactions form pain meds- none Morphine equivalent- 22.5 MME/day  Pill count performed-No Last drug screen -  05/07/2020 ( high risk q28m moderate risk q61mlow risk yearly ) Urine drug screen today- No [place]- Yes  If yes were their any concerning findings? - No  Overdose risk: 310  Opioid Risk  01/31/2020  Alcohol 0  Illegal Drugs 0  Rx Drugs 0  Alcohol 3  Illegal Drugs 0  Rx Drugs 0  Age between 16-45 years  0  History of Preadolescent Sexual Abuse 0  Psychological Disease 0  Depression 0  Opioid Risk Tool Scoring 3  Opioid Risk Interpretation Low Risk   Pain contract signed on: 05/07/2020  Hypertension: patient's systolic BP at home ranges 130-138. He is eating healthy. He is not exercising.   New complaints: None  Social history:  Relevant past medical, surgical, family and social history reviewed and updated as indicated. Interim medical history since our last visit reviewed.  Allergies and medications reviewed and updated.  DATA REVIEWED: CHART IN EPIC  ROS: Negative unless specifically indicated above in HPI.    Current Outpatient Medications:  .  albuterol (VENTOLIN HFA) 108 (90 Base) MCG/ACT inhaler, Inhale 2 puffs into the lungs every 6 (six) hours as needed for wheezing or shortness of breath., Disp: 18 g, Rfl: 2 .  amLODipine (NORVASC) 5 MG tablet, TAKE 1 TABLET BY MOUTH EVERY DAY, Disp: 90 tablet, Rfl: 0 .  aspirin 81 MG chewable tablet, Chew 81 mg by mouth daily., Disp: , Rfl:  .  atorvastatin (LIPITOR) 40 MG tablet, TAKE 1 TABLET (40 MG TOTAL) BY MOUTH DAILY AT 6 PM., Disp: 90 tablet, Rfl: 0 .  azelastine (OPTIVAR) 0.05 % ophthalmic solution, Apply to eye., Disp: , Rfl:  .  clopidogrel (PLAVIX) 75 MG tablet, TAKE 1 TABLET BY MOUTH EVERY DAY, Disp: 90 tablet, Rfl: 0 .  Coenzyme Q10 (COQ-10) 100 MG CAPS, Take 100 mg by mouth daily., Disp: , Rfl:  .  hydrALAZINE (APRESOLINE) 50 MG tablet, TAKE 1 TABLET BY MOUTH EVERY 8 HOURS., Disp: 270 tablet, Rfl: 0 .  hydrochlorothiazide (HYDRODIURIL) 12.5 MG tablet, TAKE 1 TABLET BY MOUTH EVERY DAY, Disp: 90  tablet, Rfl: 0 .  HYDROcodone-acetaminophen (NORCO) 7.5-325 MG tablet, Take 1 tablet by mouth every 8 (eight) hours., Disp: 90 tablet, Rfl: 0 .  HYDROcodone-acetaminophen (NORCO) 7.5-325 MG tablet, Take 1 tablet by mouth every 8 (eight) hours., Disp: 90 tablet, Rfl: 0 .  isosorbide mononitrate (IMDUR) 30 MG 24 hr tablet, Take 0.5 tablets (15 mg total) by mouth daily., Disp: 45 tablet, Rfl: 2 .  losartan (COZAAR) 100 MG tablet, Take 1 tablet (100 mg total) by mouth daily., Disp: 90 tablet, Rfl: 2 .  metFORMIN (GLUCOPHAGE-XR) 500 MG 24 hr tablet, Take 1 tablet (500 mg total) by mouth in the morning and at bedtime., Disp: 180 tablet, Rfl: 2 .  metoprolol tartrate (LOPRESSOR) 50 MG tablet, TAKE 1 TABLET BY MOUTH TWICE A DAY, Disp: 180 tablet, Rfl: 0 .  nitroGLYCERIN (NITROSTAT) 0.4 MG SL tablet, DISSOLVE 1 TAB UNDER TONGUE AT ONSET OF CHEST PAIN. REPEAT EVERY 5 MINUTES FOR 2 DOSES AS NEEDED, Disp: 25 tablet, Rfl: 2 .  RESTASIS 0.05 % ophthalmic emulsion, 1 drop daily., Disp: , Rfl:  .  Simethicone (GAS-X PO), Take 1 tablet by mouth daily as needed (gas)., Disp: , Rfl:  .  XIShirley Friar  5 % SOLN, , Disp: , Rfl:    No Known Allergies Past Medical History:  Diagnosis Date  . Arthritis   . CAD (coronary artery disease)    a. 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 DES to left PDA 09/20/17 // c. 10/19: LCx and LPDA stents ok; s/p DES to pLAD and POBA to D1  . Diabetes mellitus without complication ([place])    type 2   . Heart attack ([place])    " a blood clot is actually what caused my heart attack in 2018 " , he states he is unsure where the blood clot originated from   . Hyperlipidemia   . Hypertension   . Insomnia   . Pulmonary hypertension ([place])    a. TTE 03/25/17; EF 60-65%, nl WM, LV diastolic fxn nl, trivial AI, RV cavity size, wall thickness, and sys fxn nl, trivial TR, PASP 39 mmHg,  trivial pericardial effusion  . SCCA (squamous cell carcinoma) of skin 03/26/2020   Mid Parietal Scalp (well diff)  . Sleep apnea    could not use a cpap  . Vitamin B12 deficiency 05/03/2020  . Wears dentures    full top-partial bottom  . Wears glasses     Past Surgical History:  Procedure Laterality Date  . COLONOSCOPY  05/13/2010   Single anal papilla, 2 diminutive polyps at 10 cm, left-sided diverticula, one sigmoid and one a sending polyp, otherwise normal colon and TI.  Path with 1 tubular adenoma, otherwise hyperplastic polyps.  Recommendations repeat colonoscopy in 7 years.  . COLONOSCOPY WITH PROPOFOL N/A 11/22/2019   Procedure: COLONOSCOPY WITH PROPOFOL;  Surgeon: Daneil Dolin, MD;  [facility];  Service: Endoscopy;  Laterality: N/A;  9:15am  . CORONARY ATHERECTOMY N/A 07/03/2018   Procedure: CORONARY ATHERECTOMY;  Surgeon: Burnell Blanks, MD;  Location: [place] [facility];  Service: Cardiovascular;  Laterality: N/A;  . CORONARY STENT INTERVENTION N/A 09/20/2017   Procedure: CORONARY STENT INTERVENTION;  Surgeon: Leonie Man, MD;  Location: [place] [facility];  Service: Cardiovascular;  Laterality: N/A;  . CORONARY STENT INTERVENTION N/A 07/03/2018   Procedure: CORONARY STENT INTERVENTION;  Surgeon: Burnell Blanks, MD;  Location: [place] [facility];  Service: Cardiovascular;  Laterality: N/A;  . CORONARY/GRAFT ACUTE MI REVASCULARIZATION N/A 03/25/2017   Procedure: Coronary/Graft Acute MI Revascularization;  Surgeon: Sherren Mocha, MD;  Location: [place] [facility];  Service: Cardiovascular;  Laterality: N/A;  . GANGLION CYST EXCISION Left 07/25/2014   Procedure: EXCISION VOLAR AND DORSAL GANGLION LEFT WRIST;  Surgeon: Leanora Cover, MD;  Location: [place];  Service: Orthopedics;  Laterality: Left;  . INTRAVASCULAR PRESSURE WIRE/FFR STUDY N/A 09/20/2017   Procedure: INTRAVASCULAR PRESSURE WIRE/FFR STUDY;  Surgeon: Leonie Man,  MD;  Location: [place] [facility];  Service: Cardiovascular;  Laterality: N/A;  . INTRAVASCULAR PRESSURE WIRE/FFR STUDY N/A 06/30/2018   Procedure: INTRAVASCULAR PRESSURE WIRE/FFR STUDY;  Surgeon: Nelva Bush, MD;  Location: [place] [facility];  Service: Cardiovascular;  Laterality: N/A;  . KNEE ARTHROSCOPY     rifgr and left  . LEFT HEART CATH AND CORONARY ANGIOGRAPHY N/A 03/25/2017   Procedure: Left Heart Cath and Coronary Angiography;  Surgeon: Sherren Mocha, MD;  Location: [place] [facility];  Service: Cardiovascular;  Laterality: N/A;  . LEFT HEART CATH AND CORONARY ANGIOGRAPHY N/A 09/20/2017  Procedure: LEFT HEART CATH AND CORONARY ANGIOGRAPHY;  Surgeon: Leonie Man, MD;  Location: [place] [facility];  Service: Cardiovascular;  Laterality: N/A;  . LEFT HEART CATH AND CORONARY ANGIOGRAPHY N/A 06/30/2018   Procedure: LEFT HEART CATH AND CORONARY ANGIOGRAPHY;  Surgeon: Nelva Bush, MD;  Location: [place] [facility];  Service: Cardiovascular;  Laterality: N/A;  . POLYPECTOMY  11/22/2019   Procedure: POLYPECTOMY;  Surgeon: Daneil Dolin, MD;  [facility];  Service: Endoscopy;;  . SHOULDER ARTHROSCOPY  2012   left  . SHOULDER ARTHROSCOPY     right  . TEMPORARY PACEMAKER N/A 03/25/2017   Procedure: Temporary Pacemaker;  Surgeon: Sherren Mocha, MD;  Location: [place] [facility];  Service: Cardiovascular;  Laterality: N/A;  . TONSILLECTOMY    . TOTAL HIP ARTHROPLASTY Right 04/25/2019  . TOTAL HIP ARTHROPLASTY Right 04/25/2019   Procedure: TOTAL HIP ARTHROPLASTY ANTERIOR APPROACH;  Surgeon: Rod Can, MD;  Location: [facility];  Service: Orthopedics;  Laterality: Right;  . TOTAL KNEE ARTHROPLASTY  2002   left    Social History   Socioeconomic History  . Marital status: Married    Spouse name: Not on file  . Number of children: Not on file  . Years of education: Not on file  . Highest education level: Not on file  Occupational History  . Occupation: Retired   Tobacco Use  . Smoking status: Former Smoker    Packs/day: 2.00    Years: 30.00    Pack years: 60.00    Quit date: 03/25/2017    Years since quitting: 3.6  . Smokeless tobacco: Never Used  Vaping Use  . Vaping Use: Never used  Substance and Sexual Activity  . Alcohol use: Yes    Comment: 3-6 shots of liquor daily  . Drug use: Not Currently    Types: Marijuana    Comment: last used about 10 years ago (08/23/19)  . Sexual activity: Not Currently  Other Topics Concern  . Not on file  Social History Narrative   Per wife, pt was preparing to retire soon from his job at [employer].   Social Determinants of Health   Financial Resource Strain: Not on file  Food Insecurity: Not on file  Transportation Needs: Not on file  Physical Activity: Not on file  Stress: Not on file  Social Connections: Not on file  Intimate Partner Violence: Not on file        Objective:    BP (!) 150/72   Pulse 77   Temp 98.2 F (36.8 C) (Temporal)   Ht _0  (1.753 m)   Wt 207 lb 12.8 oz (94.3 kg)   SpO2 94%   BMI 30.69 kg/m   Wt Readings from Last 3 Encounters:  11/04/20 207 lb 12.8 oz (94.3 kg)  08/04/20 207 lb (93.9 kg)  07/11/20 208 lb (94.3 kg)    Physical Exam  No results found for: TSH Lab Results  Component Value Date   WBC 3.6 08/04/2020   HGB 12.7 (L) 08/04/2020   HCT 37.6 08/04/2020   MCV 92 08/04/2020   PLT 153 08/04/2020   Lab Results  Component Value Date   NA 141 08/04/2020   K 4.8 08/04/2020   CO2 23 08/04/2020   GLUCOSE 150 (H) 08/04/2020   BUN 16 08/04/2020   CREATININE 1.50 (H) 08/04/2020   BILITOT 0.4 08/04/2020   ALKPHOS 54 08/04/2020   AST 87 (H) 08/04/2020   ALT 65 (H) 08/04/2020  PROT 6.6 08/04/2020   ALBUMIN 4.0 08/04/2020   CALCIUM 9.2 08/04/2020   ANIONGAP 7 04/27/2019   Lab Results  Component Value Date   CHOL 133 10/07/2020   Lab Results  Component Value Date   HDL 51 10/07/2020   Lab Results  Component Value Date   LDLCALC 58 10/07/2020    Lab Results  Component Value Date   TRIG 138 10/07/2020   Lab Results  Component Value Date   CHOLHDL 2.6 10/07/2020   Lab Results  Component Value Date   HGBA1C 5.9 08/04/2020

## 2020-11-05 LAB — CBC WITH DIFFERENTIAL/PLATELET
Basophils Absolute: 0 10*3/uL (ref 0.0–0.2)
Basos: 1 %
EOS (ABSOLUTE): 0.1 10*3/uL (ref 0.0–0.4)
Eos: 1 %
Hematocrit: 34.6 % — ABNORMAL LOW (ref 37.5–51.0)
Hemoglobin: 11.7 g/dL — ABNORMAL LOW (ref 13.0–17.7)
Immature Grans (Abs): 0 10*3/uL (ref 0.0–0.1)
Immature Granulocytes: 1 %
Lymphocytes Absolute: 1.1 10*3/uL (ref 0.7–3.1)
Lymphs: 23 %
MCH: 32 pg (ref 26.6–33.0)
MCHC: 33.8 g/dL (ref 31.5–35.7)
MCV: 95 fL (ref 79–97)
Monocytes Absolute: 0.4 10*3/uL (ref 0.1–0.9)
Monocytes: 8 %
Neutrophils Absolute: 3.2 10*3/uL (ref 1.4–7.0)
Neutrophils: 66 %
Platelets: 140 10*3/uL — ABNORMAL LOW (ref 150–450)
RBC: 3.66 x10E6/uL — ABNORMAL LOW (ref 4.14–5.80)
RDW: 14.1 % (ref 11.6–15.4)
WBC: 4.9 10*3/uL (ref 3.4–10.8)

## 2020-11-05 LAB — CMP14+EGFR
ALT: 29 IU/L (ref 0–44)
AST: 40 IU/L (ref 0–40)
Albumin/Globulin Ratio: 1.9 (ref 1.2–2.2)
Albumin: 4.3 g/dL (ref 3.8–4.8)
Alkaline Phosphatase: 57 IU/L (ref 44–121)
BUN/Creatinine Ratio: 13 (ref 10–24)
BUN: 25 mg/dL (ref 8–27)
Bilirubin Total: 0.4 mg/dL (ref 0.0–1.2)
CO2: 20 mmol/L (ref 20–29)
Calcium: 8.7 mg/dL (ref 8.6–10.2)
Chloride: 100 mmol/L (ref 96–106)
Creatinine, Ser: 1.87 mg/dL — ABNORMAL HIGH (ref 0.76–1.27)
GFR calc Af Amer: 43 mL/min/{1.73_m2} — ABNORMAL LOW (ref >59)
GFR calc non Af Amer: 37 mL/min/{1.73_m2} — ABNORMAL LOW (ref >59)
Globulin, Total: 2.3 g/dL (ref 1.5–4.5)
Glucose: 145 mg/dL — ABNORMAL HIGH (ref 65–99)
Potassium: 4.5 mmol/L (ref 3.5–5.2)
Sodium: 140 mmol/L (ref 134–144)
Total Protein: 6.6 g/dL (ref 6.0–8.5)

## 2020-11-05 LAB — LIPID PANEL
Chol/HDL Ratio: 2.4 ratio (ref 0.0–5.0)
Cholesterol, Total: 124 mg/dL (ref 100–199)
HDL: 52 mg/dL (ref >39)
LDL Chol Calc (NIH): 49 mg/dL (ref 0–99)
Triglycerides: 134 mg/dL (ref 0–149)
VLDL Cholesterol Cal: 23 mg/dL (ref 5–40)

## 2020-11-06 ENCOUNTER — Encounter: Payer: Self-pay | Admitting: Internal Medicine

## 2020-11-28 ENCOUNTER — Other Ambulatory Visit: Payer: Self-pay | Admitting: Family Medicine

## 2020-11-28 DIAGNOSIS — I2510 Atherosclerotic heart disease of native coronary artery without angina pectoris: Secondary | ICD-10-CM

## 2020-11-28 DIAGNOSIS — I10 Essential (primary) hypertension: Secondary | ICD-10-CM

## 2020-12-08 ENCOUNTER — Other Ambulatory Visit: Payer: Self-pay | Admitting: Family

## 2020-12-08 ENCOUNTER — Other Ambulatory Visit: Payer: Self-pay | Admitting: Family Medicine

## 2020-12-08 DIAGNOSIS — I10 Essential (primary) hypertension: Secondary | ICD-10-CM

## 2020-12-08 DIAGNOSIS — R600 Localized edema: Secondary | ICD-10-CM

## 2020-12-14 ENCOUNTER — Other Ambulatory Visit: Payer: Self-pay | Admitting: Family Medicine

## 2020-12-14 DIAGNOSIS — I10 Essential (primary) hypertension: Secondary | ICD-10-CM

## 2020-12-14 DIAGNOSIS — I2510 Atherosclerotic heart disease of native coronary artery without angina pectoris: Secondary | ICD-10-CM

## 2020-12-28 ENCOUNTER — Other Ambulatory Visit: Payer: Self-pay | Admitting: Family Medicine

## 2020-12-28 DIAGNOSIS — E785 Hyperlipidemia, unspecified: Secondary | ICD-10-CM

## 2021-01-26 ENCOUNTER — Other Ambulatory Visit: Payer: Self-pay | Admitting: Family Medicine

## 2021-01-26 DIAGNOSIS — I10 Essential (primary) hypertension: Secondary | ICD-10-CM

## 2021-01-26 DIAGNOSIS — I2510 Atherosclerotic heart disease of native coronary artery without angina pectoris: Secondary | ICD-10-CM

## 2021-01-28 ENCOUNTER — Other Ambulatory Visit: Payer: Self-pay | Admitting: Family Medicine

## 2021-01-28 DIAGNOSIS — R809 Proteinuria, unspecified: Secondary | ICD-10-CM

## 2021-01-28 DIAGNOSIS — E1129 Type 2 diabetes mellitus with other diabetic kidney complication: Secondary | ICD-10-CM

## 2021-02-03 ENCOUNTER — Ambulatory Visit (INDEPENDENT_AMBULATORY_CARE_PROVIDER_SITE_OTHER): Payer: Medicare Other | Admitting: Family Medicine

## 2021-02-03 ENCOUNTER — Other Ambulatory Visit: Payer: Self-pay

## 2021-02-03 ENCOUNTER — Encounter: Payer: Self-pay | Admitting: Family Medicine

## 2021-02-03 VITALS — BP 94/62 | HR 58 | Temp 97.8°F | Ht 69.0 in | Wt 200.8 lb

## 2021-02-03 DIAGNOSIS — R809 Proteinuria, unspecified: Secondary | ICD-10-CM

## 2021-02-03 DIAGNOSIS — I2510 Atherosclerotic heart disease of native coronary artery without angina pectoris: Secondary | ICD-10-CM | POA: Diagnosis not present

## 2021-02-03 DIAGNOSIS — R600 Localized edema: Secondary | ICD-10-CM

## 2021-02-03 DIAGNOSIS — E1129 Type 2 diabetes mellitus with other diabetic kidney complication: Secondary | ICD-10-CM

## 2021-02-03 DIAGNOSIS — I10 Essential (primary) hypertension: Secondary | ICD-10-CM

## 2021-02-03 DIAGNOSIS — M7022 Olecranon bursitis, left elbow: Secondary | ICD-10-CM

## 2021-02-03 DIAGNOSIS — Z79899 Other long term (current) drug therapy: Secondary | ICD-10-CM

## 2021-02-03 DIAGNOSIS — G894 Chronic pain syndrome: Secondary | ICD-10-CM

## 2021-02-03 LAB — CMP14+EGFR
ALT: 128 IU/L — ABNORMAL HIGH (ref 0–44)
AST: 226 IU/L — ABNORMAL HIGH (ref 0–40)
Albumin/Globulin Ratio: 1.9 (ref 1.2–2.2)
Albumin: 4 g/dL (ref 3.8–4.8)
Alkaline Phosphatase: 72 IU/L (ref 44–121)
BUN/Creatinine Ratio: 16 (ref 10–24)
BUN: 24 mg/dL (ref 8–27)
Bilirubin Total: 0.5 mg/dL (ref 0.0–1.2)
CO2: 22 mmol/L (ref 20–29)
Calcium: 8.8 mg/dL (ref 8.6–10.2)
Chloride: 101 mmol/L (ref 96–106)
Creatinine, Ser: 1.54 mg/dL — ABNORMAL HIGH (ref 0.76–1.27)
Globulin, Total: 2.1 g/dL (ref 1.5–4.5)
Glucose: 105 mg/dL — ABNORMAL HIGH (ref 65–99)
Potassium: 4.2 mmol/L (ref 3.5–5.2)
Sodium: 143 mmol/L (ref 134–144)
Total Protein: 6.1 g/dL (ref 6.0–8.5)
eGFR: 50 mL/min/{1.73_m2} — ABNORMAL LOW (ref >59)

## 2021-02-03 LAB — CBC WITH DIFFERENTIAL/PLATELET
Basophils Absolute: 0 10*3/uL (ref 0.0–0.2)
Basos: 1 %
EOS (ABSOLUTE): 0.1 10*3/uL (ref 0.0–0.4)
Eos: 2 %
Hematocrit: 40.1 % (ref 37.5–51.0)
Hemoglobin: 13.7 g/dL (ref 13.0–17.7)
Immature Grans (Abs): 0.1 10*3/uL (ref 0.0–0.1)
Immature Granulocytes: 1 %
Lymphocytes Absolute: 1.1 10*3/uL (ref 0.7–3.1)
Lymphs: 28 %
MCH: 31.6 pg (ref 26.6–33.0)
MCHC: 34.2 g/dL (ref 31.5–35.7)
MCV: 92 fL (ref 79–97)
Monocytes Absolute: 0.4 10*3/uL (ref 0.1–0.9)
Monocytes: 9 %
Neutrophils Absolute: 2.3 10*3/uL (ref 1.4–7.0)
Neutrophils: 59 %
Platelets: 112 10*3/uL — ABNORMAL LOW (ref 150–450)
RBC: 4.34 x10E6/uL (ref 4.14–5.80)
RDW: 13.1 % (ref 11.6–15.4)
WBC: 4 10*3/uL (ref 3.4–10.8)

## 2021-02-03 LAB — BAYER DCA HB A1C WAIVED: HB A1C (BAYER DCA - WAIVED): 5.6 % (ref <7.0)

## 2021-02-03 LAB — LIPID PANEL
Chol/HDL Ratio: 1.8 ratio (ref 0.0–5.0)
Cholesterol, Total: 148 mg/dL (ref 100–199)
HDL: 82 mg/dL (ref >39)
LDL Chol Calc (NIH): 49 mg/dL (ref 0–99)
Triglycerides: 93 mg/dL (ref 0–149)
VLDL Cholesterol Cal: 17 mg/dL (ref 5–40)

## 2021-02-03 MED ORDER — HYDROCHLOROTHIAZIDE 12.5 MG PO TABS: 12.5000 mg | ORAL_TABLET | Freq: Every day | ORAL | 1 refills | Status: DC

## 2021-02-03 MED ORDER — HYDROCODONE-ACETAMINOPHEN 7.5-325 MG PO TABS: 1.0000 | ORAL_TABLET | Freq: Three times a day (TID) | ORAL | 0 refills | Status: DC

## 2021-02-03 MED ORDER — CLOPIDOGREL BISULFATE 75 MG PO TABS: 1.0000 | ORAL_TABLET | Freq: Every day | ORAL | 1 refills | Status: DC

## 2021-02-03 MED ORDER — PREDNISONE 10 MG (21) PO TBPK
ORAL_TABLET | ORAL | 0 refills | Status: DC
Start: 2021-02-03 — End: 2021-04-21

## 2021-02-03 NOTE — Progress Notes (Signed)
Assessment & Plan:  1. Type 2 diabetes mellitus with microalbuminuria, without long-term current use of insulin (HCC) Lab Results  Component Value Date   HGBA1C 5.8 11/04/2020   HGBA1C 5.9 08/04/2020   HGBA1C 6.8 05/02/2020    - Diabetes is at goal of A1c < 7. - Medications: continue current medications - Home glucose monitoring: continue monitoring - Patient is currently taking a statin. Patient is taking an ACE-inhibitor/ARB.   Diabetes Health Maintenance Due  Topic Date Due  . OPHTHALMOLOGY EXAM  11/20/2019  . FOOT EXAM  05/02/2021  . HEMOGLOBIN A1C  05/04/2021    - Lipid panel - CBC with Differential/Platelet - CMP14+EGFR - Bayer DCA Hb A1c Waived  2. Essential hypertension Well controlled on current regimen.  Advised to monitor blood pressure at home and let me know if he continues to have readings as low. - Lipid panel - CBC with Differential/Platelet - CMP14+EGFR - hydrochlorothiazide (HYDRODIURIL) 12.5 MG tablet; Take 1 tablet (12.5 mg total) by mouth daily.  Dispense: 90 tablet; Refill: 1  3. Coronary artery disease involving native coronary artery of native heart without angina pectoris On ASA, Plavix, and atorvastatin. - Lipid panel - clopidogrel (PLAVIX) 75 MG tablet; Take 1 tablet (75 mg total) by mouth daily.  Dispense: 90 tablet; Refill: 1  4. Bilateral lower extremity edema Well controlled on current regimen.  - CMP14+EGFR - hydrochlorothiazide (HY thumbs up DRODIURIL) 12.5 MG tablet; Take 1 tablet (12.5 mg total) by mouth daily.  Dispense: 90 tablet; Refill: 1  5-6. Chronic pain syndrome/Controlled substance agreement signed Well controlled on current regimen.  Controlled substance agreement in place.  Urine drug screen as expected.  PDMP reviewed with no concerning findings. - CMP14+EGFR - HYDROcodone-acetaminophen (NORCO) 7.5-325 MG tablet; Take 1 tablet by mouth every 8 (eight) hours.  Dispense: 90 tablet; Refill: 0 - HYDROcodone-acetaminophen  (NORCO) 7.5-325 MG tablet; Take 1 tablet by mouth every 8 (eight) hours.  Dispense: 90 tablet; Refill: 0 - HYDROcodone-acetaminophen (NORCO) 7.5-325 MG tablet; Take 1 tablet by mouth every 8 (eight) hours.  Dispense: 90 tablet; Refill: 0  7. Olecranon bursitis of left elbow Education provided on elbow bursitis. - predniSONE (STERAPRED UNI-PAK 21 TAB) 10 MG (21) TBPK tablet; As directed x 6 days  Dispense: 21 tablet; Refill: 0   Return in about 3 months (around 05/06/2021) for annual physical.  [name], MSN, APRN, FNP-C [name]  Subjective:    Patient ID: [name] Foster, male    DOB: Oct 17, 1955, 65 y.o.   MRN: [id number]  Patient Care Team: [name], FNP as PCP - General (Family Medicine) Sherren Mocha, MD as PCP - Cardiology (Cardiology) [facility] as Consulting Physician (Orthopedic Surgery) Lavonna Monarch, MD as Consulting Physician (Dermatology) Sharmon Revere as Physician Assistant (Cardiology)   Chief Complaint:  Chief Complaint  Patient presents with  . Diabetes  . Hypertension    3 month follow up    HPI: [name] Foster is a 65 y.o. male presenting on 02/03/2021 for Diabetes and Hypertension (3 month follow up)  Diabetes: Patient presents for follow up of diabetes. Current symptoms include: none. Known diabetic complications: none. Medication compliance: yes. Current diet: in general, a "healthy" diet  . Current exercise: none. Home blood sugar records: BGs are running  consistent with Hgb A1C. Is he  on ACE inhibitor or angiotensin II receptor blocker? Yes. Is he on a statin? Yes.   Pain assessment: Cause of pain- severe arthritis Pain  location- back Pain on scale of 1-10- 8/10 without medication; 4/10 with medication Frequency- daily What increases pain- movement, ADLs, awkward positions What makes pain better- rest, pain medication, heat, position changes Effects on ADL- getting dressed is more difficult Any  change in general medical condition- no  Current opioids rx- Norco 7.5/325 Q8H PRN # meds rx- 90 Effectiveness of current meds- working well Adverse reactions form pain meds- none Morphine equivalent- 22.5 MME/day  Pill count performed-No Last drug screen - 05/07/2020 ( high risk q70m moderate risk q667mlow risk yearly ) Urine drug screen today- No [place]  If yes were their any concerning findings? - No  Overdose risk: 220  Opioid Risk  01/31/2020  Alcohol 0  Illegal Drugs 0  Rx Drugs 0  Alcohol 3  Illegal Drugs 0  Rx Drugs 0  Age between 16-45 years  0  History of Preadolescent Sexual Abuse 0  Psychological Disease 0  Depression 0  Opioid Risk Tool Scoring 3  Opioid Risk Interpretation Low Risk   Pain contract signed on: 05/07/2020  Hypertension: patient states he does check his blood pressure at home and it is always good.  Denies any low readings like he has today. Denies feeling lightheaded or dizzy. He is eating healthy. He is not exercising.   New complaints: Swelling of the left elbow.    Social history:  Relevant past medical, surgical, family and social history reviewed and updated as indicated. Interim medical history since our last visit reviewed.  Allergies and medications reviewed and updated.  DATA REVIEWED: CHART IN EPIC  ROS: Negative unless specifically indicated above in HPI.    Current Outpatient Medications:  .  albuterol (VENTOLIN HFA) 108 (90 Base) MCG/ACT inhaler, Inhale 2 puffs into the lungs every 6 (six) hours as needed for wheezing or shortness of breath., Disp: 18 g, Rfl: 2 .  amLODipine (NORVASC) 5 MG tablet, TAKE 1 TABLET BY MOUTH EVERY DAY, Disp: 90 tablet, Rfl: 0 .  aspirin 81 MG chewable tablet, Chew 81 mg by mouth daily., Disp: , Rfl:  .  atorvastatin (LIPITOR) 40 MG tablet, TAKE 1 TABLET BY MOUTH DAILY AT 6 PM., Disp: 90 tablet, Rfl: 0 .  azelastine (OPTIVAR) 0.05 % ophthalmic solution, Apply to eye., Disp: ,  Rfl:  .  clopidogrel (PLAVIX) 75 MG tablet, TAKE 1 TABLET BY MOUTH EVERY DAY, Disp: 90 tablet, Rfl: 0 .  Coenzyme Q10 (COQ-10) 100 MG CAPS, Take 100 mg by mouth daily., Disp: , Rfl:  .  hydrALAZINE (APRESOLINE) 50 MG tablet, TAKE 1 TABLET BY MOUTH EVERY 8 HOURS, Disp: 270 tablet, Rfl: 0 .  hydrochlorothiazide (HYDRODIURIL) 12.5 MG tablet, TAKE 1 TABLET BY MOUTH EVERY DAY, Disp: 90 tablet, Rfl: 0 .  HYDROcodone-acetaminophen (NORCO) 7.5-325 MG tablet, Take 1 tablet by mouth every 8 (eight) hours., Disp: 90 tablet, Rfl: 0 .  HYDROcodone-acetaminophen (NORCO) 7.5-325 MG tablet, Take 1 tablet by mouth every 8 (eight) hours., Disp: 90 tablet, Rfl: 0 .  HYDROcodone-acetaminophen (NORCO) 7.5-325 MG tablet, Take 1 tablet by mouth every 8 (eight) hours., Disp: 90 tablet, Rfl: 0 .  isosorbide mononitrate (IMDUR) 30 MG 24 hr tablet, TAKE 0.5 TABLETS (15 MG TOTAL) BY MOUTH DAILY., Disp: 45 tablet, Rfl: 0 .  JARDIANCE 25 MG TABS tablet, TAKE 1 TABLET BY MOUTH DAILY BEFORE BREAKFAST., Disp: 30 tablet, Rfl: 0 .  losartan (COZAAR) 100 MG tablet, TAKE 1 TABLET BY MOUTH EVERY DAY, Disp: 90 tablet, Rfl: 1 .  metoprolol tartrate (LOPRESSOR) 50 MG tablet, TAKE 1 TABLET BY MOUTH TWICE A DAY, Disp: 180 tablet, Rfl: 0 .  nitroGLYCERIN (NITROSTAT) 0.4 MG SL tablet, DISSOLVE 1 TAB UNDER TONGUE AT ONSET OF CHEST PAIN. REPEAT EVERY 5 MINUTES FOR 2 DOSES AS NEEDED, Disp: 25 tablet, Rfl: 2 .  RESTASIS 0.05 % ophthalmic emulsion, 1 drop daily., Disp: , Rfl:  .  Simethicone (GAS-X PO), Take 1 tablet by mouth daily as needed (gas)., Disp: , Rfl:  .  XIIDRA 5 % SOLN, , Disp: , Rfl:    No Known Allergies Past Medical History:  Diagnosis Date  . Arthritis   . CAD (coronary artery disease)    a. 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 DES to left PDA 09/20/17 // c. 10/19: LCx and LPDA stents ok; s/p DES to  pLAD and POBA to D1  . Diabetes mellitus without complication ([place])    type 2   . Heart attack ([place])    " a blood clot is actually what caused my heart attack in 2018 " , he states he is unsure where the blood clot originated from   . Hyperlipidemia   . Hypertension   . Insomnia   . Pulmonary hypertension ([place])    a. TTE 03/25/17; EF 60-65%, nl WM, LV diastolic fxn nl, trivial AI, RV cavity size, wall thickness, and sys fxn nl, trivial TR, PASP 39 mmHg, trivial pericardial effusion  . SCCA (squamous cell carcinoma) of skin 03/26/2020   Mid Parietal Scalp (well diff)  . Sleep apnea    could not use a cpap  . Vitamin B12 deficiency 05/03/2020  . Wears dentures    full top-partial bottom  . Wears glasses     Past Surgical History:  Procedure Laterality Date  . COLONOSCOPY  05/13/2010   Single anal papilla, 2 diminutive polyps at 10 cm, left-sided diverticula, one sigmoid and one a sending polyp, otherwise normal colon and TI.  Path with 1 tubular adenoma, otherwise hyperplastic polyps.  Recommendations repeat colonoscopy in 7 years.  . COLONOSCOPY WITH PROPOFOL N/A 11/22/2019   Procedure: COLONOSCOPY WITH PROPOFOL;  Surgeon: Daneil Dolin, MD;  [facility];  Service: Endoscopy;  Laterality: N/A;  9:15am  . CORONARY ATHERECTOMY N/A 07/03/2018   Procedure: CORONARY ATHERECTOMY;  Surgeon: Burnell Blanks, MD;  Location: [place] [facility];  Service: Cardiovascular;  Laterality: N/A;  . CORONARY STENT INTERVENTION N/A 09/20/2017   Procedure: CORONARY STENT INTERVENTION;  Surgeon: Leonie Man, MD;  Location: [place] [facility];  Service: Cardiovascular;  Laterality: N/A;  . CORONARY STENT INTERVENTION N/A 07/03/2018   Procedure: CORONARY STENT INTERVENTION;  Surgeon: Burnell Blanks, MD;  Location: [place] [facility];  Service: Cardiovascular;  Laterality: N/A;  . CORONARY/GRAFT ACUTE MI REVASCULARIZATION N/A 03/25/2017   Procedure: Coronary/Graft Acute MI  Revascularization;  Surgeon: Sherren Mocha, MD;  Location: [place] [facility];  Service: Cardiovascular;  Laterality: N/A;  . GANGLION CYST EXCISION Left 07/25/2014   Procedure: EXCISION VOLAR AND DORSAL GANGLION LEFT WRIST;  Surgeon: Leanora Cover, MD;  Location: [place];  Service: Orthopedics;  Laterality: Left;  . INTRAVASCULAR PRESSURE WIRE/FFR STUDY N/A 09/20/2017   Procedure: INTRAVASCULAR PRESSURE WIRE/FFR STUDY;  Surgeon: Leonie Man, MD;  Location: [place] [facility];  Service: Cardiovascular;  Laterality: N/A;  . INTRAVASCULAR  PRESSURE WIRE/FFR STUDY N/A 06/30/2018   Procedure: INTRAVASCULAR PRESSURE WIRE/FFR STUDY;  Surgeon: Nelva Bush, MD;  Location: [place] [facility];  Service: Cardiovascular;  Laterality: N/A;  . KNEE ARTHROSCOPY     rifgr and left  . LEFT HEART CATH AND CORONARY ANGIOGRAPHY N/A 03/25/2017   Procedure: Left Heart Cath and Coronary Angiography;  Surgeon: Sherren Mocha, MD;  Location: [place] [facility];  Service: Cardiovascular;  Laterality: N/A;  . LEFT HEART CATH AND CORONARY ANGIOGRAPHY N/A 09/20/2017   Procedure: LEFT HEART CATH AND CORONARY ANGIOGRAPHY;  Surgeon: Leonie Man, MD;  Location: [place] [facility];  Service: Cardiovascular;  Laterality: N/A;  . LEFT HEART CATH AND CORONARY ANGIOGRAPHY N/A 06/30/2018   Procedure: LEFT HEART CATH AND CORONARY ANGIOGRAPHY;  Surgeon: Nelva Bush, MD;  Location: [place] [facility];  Service: Cardiovascular;  Laterality: N/A;  . POLYPECTOMY  11/22/2019   Procedure: POLYPECTOMY;  Surgeon: Daneil Dolin, MD;  [facility];  Service: Endoscopy;;  . SHOULDER ARTHROSCOPY  2012   left  . SHOULDER ARTHROSCOPY     right  . TEMPORARY PACEMAKER N/A 03/25/2017   Procedure: Temporary Pacemaker;  Surgeon: Sherren Mocha, MD;  Location: [place] [facility];  Service: Cardiovascular;  Laterality: N/A;  . TONSILLECTOMY    . TOTAL HIP ARTHROPLASTY Right 04/25/2019  . TOTAL HIP  ARTHROPLASTY Right 04/25/2019   Procedure: TOTAL HIP ARTHROPLASTY ANTERIOR APPROACH;  Surgeon: Rod Can, MD;  Location: [facility];  Service: Orthopedics;  Laterality: Right;  . TOTAL KNEE ARTHROPLASTY  2002   left    Social History   Socioeconomic History  . Marital status: Married    Spouse name: Not on file  . Number of children: Not on file  . Years of education: Not on file  . Highest education level: Not on file  Occupational History  . Occupation: Retired  Tobacco Use  . Smoking status: Former Smoker    Packs/day: 2.00    Years: 30.00    Pack years: 60.00    Quit date: 03/25/2017    Years since quitting: 3.8  . Smokeless tobacco: Never Used  Vaping Use  . Vaping Use: Never used  Substance and Sexual Activity  . Alcohol use: Yes    Comment: 3-6 shots of liquor daily  . Drug use: Not Currently    Types: Marijuana    Comment: last used about 10 years ago (08/23/19)  . Sexual activity: Not Currently  Other Topics Concern  . Not on file  Social History Narrative   Per wife, pt was preparing to retire soon from his job at [employer].   Social Determinants of Health   Financial Resource Strain: Not on file  Food Insecurity: Not on file  Transportation Needs: Not on file  Physical Activity: Not on file  Stress: Not on file  Social Connections: Not on file  Intimate Partner Violence: Not on file        Objective:    BP 94/62   Pulse (!) 58   Temp 97.8 F (36.6 C) (Temporal)   Ht _0  (1.753 m)   Wt 200 lb 12.8 oz (91.1 kg)   SpO2 94%   BMI 29.65 kg/m   Wt Readings from Last 3 Encounters:  02/03/21 200 lb 12.8 oz (91.1 kg)  11/04/20 207 lb 12.8 oz (94.3 kg)  08/04/20 207 lb (93.9 kg)    Physical Exam Vitals reviewed.  Constitutional:      General: He is  not in acute distress.    Appearance: Normal appearance. He is overweight. He is not ill-appearing, toxic-appearing or diaphoretic.  HENT:     Head: Normocephalic and atraumatic.  Eyes:     General:  No scleral icterus.       Right eye: No discharge.        Left eye: No discharge.     Conjunctiva/sclera: Conjunctivae normal.  Cardiovascular:     Rate and Rhythm: Normal rate and regular rhythm.     Heart sounds: Normal heart sounds. No murmur heard. No friction rub. No gallop.   Pulmonary:     Effort: Pulmonary effort is normal. No respiratory distress.     Breath sounds: Normal breath sounds. No stridor. No wheezing, rhonchi or rales.  Musculoskeletal:        General: Normal range of motion.     Left elbow: Swelling (of bursa) present.     Cervical back: Normal range of motion.  Skin:    General: Skin is warm and dry.  Neurological:     Mental Status: He is alert and oriented to person, place, and time. Mental status is at baseline.  Psychiatric:        Mood and Affect: Mood normal.        Behavior: Behavior normal.        Thought Content: Thought content normal.        Judgment: Judgment normal.     No results found for: TSH Lab Results  Component Value Date   WBC 4.9 11/04/2020   HGB 11.7 (L) 11/04/2020   HCT 34.6 (L) 11/04/2020   MCV 95 11/04/2020   PLT 140 (L) 11/04/2020   Lab Results  Component Value Date   NA 140 11/04/2020   K 4.5 11/04/2020   CO2 20 11/04/2020   GLUCOSE 145 (H) 11/04/2020   BUN 25 11/04/2020   CREATININE 1.87 (H) 11/04/2020   BILITOT 0.4 11/04/2020   ALKPHOS 57 11/04/2020   AST 40 11/04/2020   ALT 29 11/04/2020   PROT 6.6 11/04/2020   ALBUMIN 4.3 11/04/2020   CALCIUM 8.7 11/04/2020   ANIONGAP 7 04/27/2019   Lab Results  Component Value Date   CHOL 124 11/04/2020   Lab Results  Component Value Date   HDL 52 11/04/2020   Lab Results  Component Value Date   LDLCALC 49 11/04/2020   Lab Results  Component Value Date   TRIG 134 11/04/2020   Lab Results  Component Value Date   CHOLHDL 2.4 11/04/2020   Lab Results  Component Value Date   HGBA1C 5.8 11/04/2020

## 2021-02-03 NOTE — Patient Instructions (Addendum)
Schedule your colonoscopy.   Monitor your blood pressure at home. Let me know if you are staying low.    Elbow Bursitis  Bursitis is swelling and pain at the tip of your elbow. This happens when fluid builds up in a sac under your skin (bursa). This may also be called olecranon bursitis. Follow these instructions at home: Medicines  Take over-the-counter and prescription medicines only as told by your doctor.  If you were prescribed an antibiotic, take it exactly as told by your doctor. Do not stop taking it even if you start to feel better. Managing pain, stiffness, and swelling  If told, put ice on your elbow: ? Put ice in a plastic bag. ? Place a towel between your skin and the bag. ? Leave the ice on for 20 minutes, 2-3 times a day.  If your bursitis is caused by an injury, follow instructions from your doctor about: ? Resting your elbow. ? Wearing a bandage.  Wear elbow pads or elbow wraps as needed. These help cushion your elbow.   General instructions  Avoid any activities that cause elbow pain. Ask your doctor what activities are safe for you.  Keep all follow-up visits as told by your doctor. This is important. Contact a doctor if you have:  A fever.  Problems that do not get better with treatment.  Pain or swelling that: ? Gets worse. ? Goes away and then comes back.  Pus draining from your elbow. Get help right away if you have:  Trouble moving your arm, hand, or fingers. Summary  Bursitis is swelling and pain at the tip of the elbow.  You may need to take medicine or put ice on your elbow.  Contact your doctor if your problems do not get better with treatment. This information is not intended to replace advice given to you by your health care provider. Make sure you discuss any questions you have with your health care provider. Document Revised: 03/05/2020 Document Reviewed: 03/05/2020 Elsevier Patient Education  [place].

## 2021-03-01 ENCOUNTER — Other Ambulatory Visit: Payer: Self-pay | Admitting: Family Medicine

## 2021-03-01 DIAGNOSIS — E1129 Type 2 diabetes mellitus with other diabetic kidney complication: Secondary | ICD-10-CM

## 2021-03-28 ENCOUNTER — Other Ambulatory Visit: Payer: Self-pay | Admitting: Family Medicine

## 2021-03-28 DIAGNOSIS — E785 Hyperlipidemia, unspecified: Secondary | ICD-10-CM

## 2021-04-21 ENCOUNTER — Encounter: Payer: Self-pay | Admitting: Family Medicine

## 2021-04-21 ENCOUNTER — Other Ambulatory Visit: Payer: Self-pay

## 2021-04-21 ENCOUNTER — Ambulatory Visit (INDEPENDENT_AMBULATORY_CARE_PROVIDER_SITE_OTHER): Payer: Medicare Other | Admitting: Family Medicine

## 2021-04-21 VITALS — BP 152/84 | HR 79 | Temp 98.8°F | Ht 69.0 in | Wt 191.2 lb

## 2021-04-21 DIAGNOSIS — I959 Hypotension, unspecified: Secondary | ICD-10-CM

## 2021-04-21 DIAGNOSIS — I10 Essential (primary) hypertension: Secondary | ICD-10-CM | POA: Diagnosis not present

## 2021-04-21 DIAGNOSIS — I2510 Atherosclerotic heart disease of native coronary artery without angina pectoris: Secondary | ICD-10-CM

## 2021-04-21 DIAGNOSIS — E1129 Type 2 diabetes mellitus with other diabetic kidney complication: Secondary | ICD-10-CM | POA: Diagnosis not present

## 2021-04-21 DIAGNOSIS — R809 Proteinuria, unspecified: Secondary | ICD-10-CM

## 2021-04-21 MED ORDER — AMLODIPINE BESYLATE 5 MG PO TABS: 5.0000 mg | ORAL_TABLET | Freq: Every day | ORAL | 1 refills | Status: DC

## 2021-04-21 MED ORDER — ISOSORBIDE MONONITRATE ER 30 MG PO TB24: 15.0000 mg | ORAL_TABLET | Freq: Every day | ORAL | 1 refills | Status: DC

## 2021-04-21 MED ORDER — EMPAGLIFLOZIN 25 MG PO TABS: ORAL_TABLET | ORAL | 1 refills | Status: DC

## 2021-04-21 MED ORDER — METOPROLOL TARTRATE 50 MG PO TABS: 50.0000 mg | ORAL_TABLET | Freq: Two times a day (BID) | ORAL | 1 refills | Status: DC

## 2021-04-21 NOTE — Progress Notes (Signed)
Assessment & Plan:  Hypotensive episode Advised to cut out the morning hydralazine dose. May continue afternoon and evening. Continue other blood pressure medications. Keep a log of BP readings at home and bring to the next appointment. Call me if continuing to have low readings before our next appointment at the end of this month.    Return as scheduled.  [name], MSN, APRN, FNP-C [facility]  Subjective:    Patient ID: [name] Foster, male    DOB: Mar 22, 1956, 66 y.o.   MRN: [id number]  Patient Care Team: [name], FNP as PCP - General (Family Medicine) Sherren Mocha, MD as PCP - Cardiology (Cardiology) [facility] as Consulting Physician (Orthopedic Surgery) Lavonna Monarch, MD as Consulting Physician (Dermatology) Sharmon Revere as Physician Assistant (Cardiology) Harlen Labs, MD as Referring Physician (Optometry)   Chief Complaint:  Chief Complaint  Patient presents with   hypotenshion     Patient states that his BP has been running low on and off     HPI: [name] Foster is a 65 y.o. male presenting on 04/21/2021 for hypotenshion  (Patient states that his BP has been running low on and off )  Patient has been checking his BP randomly. He has been having low readings for the past couple of months. He is symptomatic (head tingles and he feels like he might fall). Symptoms resolve when he sits down. Lows occur mostly in the morning through lunch time. Morning BP medications include amlodipine, HCTZ, Imdur, hydralazine, and metoprolol. He takes hydralazine midday. Takes Hydralazine, Losartan, and metoprolol in the evening. Gives the following low readings: 90/53, 90/53, and 90/62.  New complaints: None   Social history:  Relevant past medical, surgical, family and social history reviewed and updated as indicated. Interim medical history since our last visit reviewed.  Allergies and medications reviewed and  updated.  DATA REVIEWED: CHART IN EPIC  ROS: Negative unless specifically indicated above in HPI.    Current Outpatient Medications:    albuterol (VENTOLIN HFA) 108 (90 Base) MCG/ACT inhaler, Inhale 2 puffs into the lungs every 6 (six) hours as needed for wheezing or shortness of breath., Disp: 18 g, Rfl: 2   amLODipine (NORVASC) 5 MG tablet, TAKE 1 TABLET BY MOUTH EVERY DAY, Disp: 90 tablet, Rfl: 0   aspirin 81 MG chewable tablet, Chew 81 mg by mouth daily., Disp: , Rfl:    atorvastatin (LIPITOR) 40 MG tablet, TAKE 1 TABLET BY MOUTH DAILY AT 6 PM., Disp: 90 tablet, Rfl: 0   azelastine (OPTIVAR) 0.05 % ophthalmic solution, Apply to eye., Disp: , Rfl:    clopidogrel (PLAVIX) 75 MG tablet, Take 1 tablet (75 mg total) by mouth daily., Disp: 90 tablet, Rfl: 1   Coenzyme Q10 (COQ-10) 100 MG CAPS, Take 100 mg by mouth daily., Disp: , Rfl:    hydrALAZINE (APRESOLINE) 50 MG tablet, TAKE 1 TABLET BY MOUTH EVERY 8 HOURS, Disp: 270 tablet, Rfl: 0   hydrochlorothiazide (HYDRODIURIL) 12.5 MG tablet, Take 1 tablet (12.5 mg total) by mouth daily., Disp: 90 tablet, Rfl: 1   HYDROcodone-acetaminophen (NORCO) 7.5-325 MG tablet, Take 1 tablet by mouth every 8 (eight) hours., Disp: 90 tablet, Rfl: 0   HYDROcodone-acetaminophen (NORCO) 7.5-325 MG tablet, Take 1 tablet by mouth every 8 (eight) hours., Disp: 90 tablet, Rfl: 0   HYDROcodone-acetaminophen (NORCO) 7.5-325 MG tablet, Take 1 tablet by mouth every 8 (eight) hours., Disp: 90 tablet, Rfl: 0   isosorbide mononitrate (IMDUR)  30 MG 24 hr tablet, TAKE 0.5 TABLETS (15 MG TOTAL) BY MOUTH DAILY., Disp: 45 tablet, Rfl: 0   JARDIANCE 25 MG TABS tablet, TAKE 1 TABLET BY MOUTH EVERY DAY BEFORE BREAKFAST, Disp: 30 tablet, Rfl: 1   losartan (COZAAR) 100 MG tablet, TAKE 1 TABLET BY MOUTH EVERY DAY, Disp: 90 tablet, Rfl: 1   metoprolol tartrate (LOPRESSOR) 50 MG tablet, TAKE 1 TABLET BY MOUTH TWICE A DAY, Disp: 180 tablet, Rfl: 0   nitroGLYCERIN (NITROSTAT) 0.4 MG SL  tablet, DISSOLVE 1 TAB UNDER TONGUE AT ONSET OF CHEST PAIN. REPEAT EVERY 5 MINUTES FOR 2 DOSES AS NEEDED, Disp: 25 tablet, Rfl: 2   RESTASIS 0.05 % ophthalmic emulsion, 1 drop daily., Disp: , Rfl:    Simethicone (GAS-X PO), Take 1 tablet by mouth daily as needed (gas)., Disp: , Rfl:    XIIDRA 5 % SOLN, , Disp: , Rfl:    No Known Allergies Past Medical History:  Diagnosis Date   Arthritis    CAD (coronary artery disease)    a. 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 DES to left PDA 09/20/17 // c. 10/19: LCx and LPDA stents ok; s/p DES to pLAD and POBA to D1   Diabetes mellitus without complication ([place])    type 2    Heart attack ([place])    " a blood clot is actually what caused my heart attack in 2018 " , he states he is unsure where the blood clot originated from    Hyperlipidemia    Hypertension    Insomnia    Pulmonary hypertension ([place])    a. TTE 03/25/17; EF 60-65%, nl WM, LV diastolic fxn nl, trivial AI, RV cavity size, wall thickness, and sys fxn nl, trivial TR, PASP 39 mmHg, trivial pericardial effusion   SCCA (squamous cell carcinoma) of skin 03/26/2020   Mid Parietal Scalp (well diff)   Sleep apnea    could not use a cpap   Vitamin B12 deficiency 05/03/2020   Wears dentures    full top-partial bottom   Wears glasses     Past Surgical History:  Procedure Laterality Date   COLONOSCOPY  05/13/2010   Single anal papilla, 2 diminutive polyps at 10 cm, left-sided diverticula, one sigmoid and one a sending polyp, otherwise normal colon and TI.  Path with 1 tubular adenoma, otherwise hyperplastic polyps.  Recommendations repeat colonoscopy in 7 years.   COLONOSCOPY WITH PROPOFOL N/A 11/22/2019   Procedure: COLONOSCOPY WITH PROPOFOL;  Surgeon: Daneil Dolin, MD;  [facility];  Service: Endoscopy;  Laterality: N/A;  9:15am   CORONARY ATHERECTOMY N/A  07/03/2018   Procedure: CORONARY ATHERECTOMY;  Surgeon: Burnell Blanks, MD;  Location: [place] [facility];  Service: Cardiovascular;  Laterality: N/A;   CORONARY STENT INTERVENTION N/A 09/20/2017   Procedure: CORONARY STENT INTERVENTION;  Surgeon: Leonie Man, MD;  Location: [place] [facility];  Service: Cardiovascular;  Laterality: N/A;   CORONARY STENT INTERVENTION N/A 07/03/2018   Procedure: CORONARY STENT INTERVENTION;  Surgeon: Burnell Blanks, MD;  Location: [place] [facility];  Service: Cardiovascular;  Laterality: N/A;   CORONARY/GRAFT ACUTE MI REVASCULARIZATION N/A 03/25/2017   Procedure: Coronary/Graft Acute MI Revascularization;  Surgeon: Sherren Mocha, MD;  Location: [place] [facility];  Service: Cardiovascular;  Laterality: N/A;   GANGLION CYST EXCISION Left 07/25/2014  Procedure: EXCISION VOLAR AND DORSAL GANGLION LEFT WRIST;  Surgeon: Leanora Cover, MD;  Location: [place];  Service: Orthopedics;  Laterality: Left;   INTRAVASCULAR PRESSURE WIRE/FFR STUDY N/A 09/20/2017   Procedure: INTRAVASCULAR PRESSURE WIRE/FFR STUDY;  Surgeon: Leonie Man, MD;  Location: [place] [facility];  Service: Cardiovascular;  Laterality: N/A;   INTRAVASCULAR PRESSURE WIRE/FFR STUDY N/A 06/30/2018   Procedure: INTRAVASCULAR PRESSURE WIRE/FFR STUDY;  Surgeon: Nelva Bush, MD;  Location: [place] [facility];  Service: Cardiovascular;  Laterality: N/A;   KNEE ARTHROSCOPY     rifgr and left   LEFT HEART CATH AND CORONARY ANGIOGRAPHY N/A 03/25/2017   Procedure: Left Heart Cath and Coronary Angiography;  Surgeon: Sherren Mocha, MD;  Location: [place] [facility];  Service: Cardiovascular;  Laterality: N/A;   LEFT HEART CATH AND CORONARY ANGIOGRAPHY N/A 09/20/2017   Procedure: LEFT HEART CATH AND CORONARY ANGIOGRAPHY;  Surgeon: Leonie Man, MD;  Location: [place] [facility];  Service: Cardiovascular;  Laterality: N/A;   LEFT HEART CATH AND CORONARY ANGIOGRAPHY N/A  06/30/2018   Procedure: LEFT HEART CATH AND CORONARY ANGIOGRAPHY;  Surgeon: Nelva Bush, MD;  Location: [place] [facility];  Service: Cardiovascular;  Laterality: N/A;   POLYPECTOMY  11/22/2019   Procedure: POLYPECTOMY;  Surgeon: Daneil Dolin, MD;  [facility];  Service: Endoscopy;;   SHOULDER ARTHROSCOPY  2012   left   SHOULDER ARTHROSCOPY     right   TEMPORARY PACEMAKER N/A 03/25/2017   Procedure: Temporary Pacemaker;  Surgeon: Sherren Mocha, MD;  Location: [place] [facility];  Service: Cardiovascular;  Laterality: N/A;   TONSILLECTOMY     TOTAL HIP ARTHROPLASTY Right 04/25/2019   TOTAL HIP ARTHROPLASTY Right 04/25/2019   Procedure: TOTAL HIP ARTHROPLASTY ANTERIOR APPROACH;  Surgeon: Rod Can, MD;  Location: [facility];  Service: Orthopedics;  Laterality: Right;   TOTAL KNEE ARTHROPLASTY  2002   left    Social History   Socioeconomic History   Marital status: Married    Spouse name: Not on file   Number of children: Not on file   Years of education: Not on file   Highest education level: Not on file  Occupational History   Occupation: Retired  Tobacco Use   Smoking status: Former    Packs/day: 2.00    Years: 30.00    Pack years: 60.00    Types: Cigarettes    Quit date: 03/25/2017    Years since quitting: 4.0   Smokeless tobacco: Never  Vaping Use   Vaping Use: Never used  Substance and Sexual Activity   Alcohol use: Yes    Comment: 3-6 shots of liquor daily   Drug use: Not Currently    Types: Marijuana    Comment: last used about 10 years ago (08/23/19)   Sexual activity: Not Currently  Other Topics Concern   Not on file  Social History Narrative   Per wife, pt was preparing to retire soon from his job at [employer].   Social Determinants of Health   Financial Resource Strain: Not on file  Food Insecurity: Not on file  Transportation Needs: Not on file  Physical Activity: Not on file  Stress: Not on file  Social Connections: Not on file   Intimate Partner Violence: Not on file        Objective:    BP (!) 152/84   Pulse 79   Temp 98.8 F (37.1 C) (Temporal)   Ht '5\' 9"'  (1.753 m)  Wt 191 lb 3.2 oz (86.7 kg)   SpO2 95%   BMI 28.24 kg/m   Wt Readings from Last 3 Encounters:  04/21/21 191 lb 3.2 oz (86.7 kg)  02/03/21 200 lb 12.8 oz (91.1 kg)  11/04/20 207 lb 12.8 oz (94.3 kg)    Physical Exam Vitals reviewed.  Constitutional:      General: He is not in acute distress.    Appearance: Normal appearance. He is overweight. He is not ill-appearing, toxic-appearing or diaphoretic.  HENT:     Head: Normocephalic and atraumatic.  Eyes:     General: No scleral icterus.       Right eye: No discharge.        Left eye: No discharge.     Conjunctiva/sclera: Conjunctivae normal.  Cardiovascular:     Rate and Rhythm: Normal rate and regular rhythm.     Heart sounds: Normal heart sounds. No murmur heard.   No friction rub. No gallop.  Pulmonary:     Effort: Pulmonary effort is normal. No respiratory distress.     Breath sounds: Normal breath sounds. No stridor. No wheezing, rhonchi or rales.  Musculoskeletal:        General: Normal range of motion.     Cervical back: Normal range of motion.  Skin:    General: Skin is warm and dry.  Neurological:     Mental Status: He is alert and oriented to person, place, and time. Mental status is at baseline.  Psychiatric:        Mood and Affect: Mood normal.        Behavior: Behavior normal.        Thought Content: Thought content normal.        Judgment: Judgment normal.    No results found for: TSH Lab Results  Component Value Date   WBC 4.0 02/03/2021   HGB 13.7 02/03/2021   HCT 40.1 02/03/2021   MCV 92 02/03/2021   PLT 112 (L) 02/03/2021   Lab Results  Component Value Date   NA 143 02/03/2021   K 4.2 02/03/2021   CO2 22 02/03/2021   GLUCOSE 105 (H) 02/03/2021   BUN 24 02/03/2021   CREATININE 1.54 (H) 02/03/2021   BILITOT 0.5 02/03/2021   ALKPHOS 72  02/03/2021   AST 226 (H) 02/03/2021   ALT 128 (H) 02/03/2021   PROT 6.1 02/03/2021   ALBUMIN 4.0 02/03/2021   CALCIUM 8.8 02/03/2021   ANIONGAP 7 04/27/2019   EGFR 50 (L) 02/03/2021   Lab Results  Component Value Date   CHOL 148 02/03/2021   Lab Results  Component Value Date   HDL 82 02/03/2021   Lab Results  Component Value Date   LDLCALC 49 02/03/2021   Lab Results  Component Value Date   TRIG 93 02/03/2021   Lab Results  Component Value Date   CHOLHDL 1.8 02/03/2021   Lab Results  Component Value Date   HGBA1C 5.6 02/03/2021

## 2021-05-06 ENCOUNTER — Encounter: Payer: Self-pay | Admitting: Family Medicine

## 2021-05-06 ENCOUNTER — Ambulatory Visit (INDEPENDENT_AMBULATORY_CARE_PROVIDER_SITE_OTHER): Payer: Medicare Other | Admitting: Family Medicine

## 2021-05-06 ENCOUNTER — Other Ambulatory Visit: Payer: Self-pay

## 2021-05-06 VITALS — BP 99/59 | HR 62 | Temp 98.0°F | Ht 69.0 in | Wt 191.8 lb

## 2021-05-06 DIAGNOSIS — R944 Abnormal results of kidney function studies: Secondary | ICD-10-CM

## 2021-05-06 DIAGNOSIS — Z79899 Other long term (current) drug therapy: Secondary | ICD-10-CM | POA: Diagnosis not present

## 2021-05-06 DIAGNOSIS — Z0000 Encounter for general adult medical examination without abnormal findings: Secondary | ICD-10-CM

## 2021-05-06 DIAGNOSIS — R7989 Other specified abnormal findings of blood chemistry: Secondary | ICD-10-CM

## 2021-05-06 DIAGNOSIS — E785 Hyperlipidemia, unspecified: Secondary | ICD-10-CM

## 2021-05-06 DIAGNOSIS — G894 Chronic pain syndrome: Secondary | ICD-10-CM

## 2021-05-06 DIAGNOSIS — R600 Localized edema: Secondary | ICD-10-CM

## 2021-05-06 DIAGNOSIS — Z0001 Encounter for general adult medical examination with abnormal findings: Secondary | ICD-10-CM

## 2021-05-06 DIAGNOSIS — I10 Essential (primary) hypertension: Secondary | ICD-10-CM | POA: Diagnosis not present

## 2021-05-06 DIAGNOSIS — R630 Anorexia: Secondary | ICD-10-CM

## 2021-05-06 DIAGNOSIS — F4321 Adjustment disorder with depressed mood: Secondary | ICD-10-CM

## 2021-05-06 DIAGNOSIS — I2510 Atherosclerotic heart disease of native coronary artery without angina pectoris: Secondary | ICD-10-CM

## 2021-05-06 MED ORDER — HYDROCODONE-ACETAMINOPHEN 7.5-325 MG PO TABS: 1.0000 | ORAL_TABLET | Freq: Three times a day (TID) | ORAL | 0 refills | Status: DC

## 2021-05-06 MED ORDER — MIRTAZAPINE 7.5 MG PO TABS: 7.5000 mg | ORAL_TABLET | Freq: Every day | ORAL | 2 refills | Status: DC

## 2021-05-06 MED ORDER — HYDRALAZINE HCL 50 MG PO TABS: 50.0000 mg | ORAL_TABLET | Freq: Three times a day (TID) | ORAL | 1 refills | Status: DC

## 2021-05-06 MED ORDER — HYDROCHLOROTHIAZIDE 12.5 MG PO TABS: 12.5000 mg | ORAL_TABLET | Freq: Every day | ORAL | 1 refills | Status: DC

## 2021-05-06 MED ORDER — LOSARTAN POTASSIUM 100 MG PO TABS: 100.0000 mg | ORAL_TABLET | Freq: Every day | ORAL | 1 refills | Status: DC

## 2021-05-06 NOTE — Progress Notes (Signed)
Assessment & Plan:  1. Well adult exam - preventative health information provided - encouraged healthy diet and exercise - CMP14+EGFR  2. Chronic pain syndrome -Well controlled on current regimen - HYDROcodone-acetaminophen (NORCO) 7.5-325 MG tablet; Take 1 tablet by mouth every 8 (eight) hours.  Dispense: 90 tablet; Refill: 0 - HYDROcodone-acetaminophen (NORCO) 7.5-325 MG tablet; Take 1 tablet by mouth every 8 (eight) hours.  Dispense: 90 tablet; Refill: 0 - HYDROcodone-acetaminophen (NORCO) 7.5-325 MG tablet; Take 1 tablet by mouth every 8 (eight) hours.  Dispense: 90 tablet; Refill: 0 - ToxASSURE Select 13 (MW), Urine  3. Controlled substance agreement signed - HYDROcodone-acetaminophen (NORCO) 7.5-325 MG tablet; Take 1 tablet by mouth every 8 (eight) hours.  Dispense: 90 tablet; Refill: 0 - HYDROcodone-acetaminophen (NORCO) 7.5-325 MG tablet; Take 1 tablet by mouth every 8 (eight) hours.  Dispense: 90 tablet; Refill: 0 - HYDROcodone-acetaminophen (NORCO) 7.5-325 MG tablet; Take 1 tablet by mouth every 8 (eight) hours.  Dispense: 90 tablet; Refill: 0 - ToxASSURE Select 13 (MW), Urine  4. Essential hypertension - intermittent episodes of hypotension but otherwise borderline high - continue medications as prescribed - encouraged healthy diet and exercise - encouraged to continue to take BP and keep log at home, also to record pulse - hydrochlorothiazide (HYDRODIURIL) 12.5 MG tablet; Take 1 tablet (12.5 mg total) by mouth daily.  Dispense: 90 tablet; Refill: 1 - hydrALAZINE (APRESOLINE) 50 MG tablet; Take 1 tablet (50 mg total) by mouth every 8 (eight) hours.  Dispense: 270 tablet; Refill: 1 - losartan (COZAAR) 100 MG tablet; Take 1 tablet (100 mg total) by mouth daily.  Dispense: 90 tablet; Refill: 1 - CMP14+EGFR  5. Coronary artery disease involving native coronary artery of native heart without angina pectoris - Well controlled on current regimen - losartan (COZAAR) 100 MG  tablet; Take 1 tablet (100 mg total) by mouth daily.  Dispense: 90 tablet; Refill: 1 - CMP14+EGFR  6. Dyslipidemia, goal LDL below 70 - Well controlled on current regimen  7. Bilateral lower extremity edema - Well controlled on current regimen - hydrochlorothiazide (HYDRODIURIL) 12.5 MG tablet; Take 1 tablet (12.5 mg total) by mouth daily.  Dispense: 90 tablet; Refill: 1 - CMP14+EGFR  8. Decreased GFR - CMP14+EGFR  9. Elevated LFTs - CMP14+EGFR  10. Decreased appetite - added mirtazapine - mirtazapine (REMERON) 7.5 MG tablet; Take 1 tablet (7.5 mg total) by mouth at bedtime.  Dispense: 30 tablet; Refill: 2  11. Grief reaction - added mirtazapine - mirtazapine (REMERON) 7.5 MG tablet; Take 1 tablet (7.5 mg total) by mouth at bedtime.  Dispense: 30 tablet; Refill: 2  12. Health maintenance - foot exam today   Follow-up: Return in about 3 months (around 08/06/2021) for follow-up of chronic medication conditions.   Lucile Crater, NP Student  Subjective:  Patient ID: [name] Foster, male    DOB: 06-19-1956  Age: 65 y.o. MRN: [id number]  Patient Care Team: [name], FNP as PCP - General (Family Medicine) Sherren Mocha, MD as PCP - Cardiology (Cardiology) [facility] as Consulting Physician (Orthopedic Surgery) Lavonna Monarch, MD as Consulting Physician (Dermatology) Sharmon Revere as Physician Assistant (Cardiology) Harlen Labs, MD as Referring Physician (Optometry)   CC:  Chief Complaint  Patient presents with   Annual Exam    HPI [name] Foster presents for annual physical.  Occupation: retired, Marital status: widowed, Substance use: none Diet: low sugar, Exercise: none Last eye exam: 3 months ago, Dr. Truman Hayward Last dental exam: 6  months ago, Dr. Abigail Miyamoto Last colonoscopy: states he will do later Hepatitis C Screening: completed PSA: never, no prostate symptoms Immunizations: Flu Vaccine:  will get when available Tdap Vaccine: up to date   Shingrix Vaccine: up to date  COVID-19 Vaccine: up to date Pneumonia Vaccine: declined   DEPRESSION SCREENING PHQ 2/9 Scores 05/06/2021 04/21/2021 02/03/2021 08/04/2020 05/02/2020 01/31/2020 07/19/2019  PHQ - 2 Score 6 4 0 0 0 0 2  PHQ- 9 Score '17 9 2 ' 0 - - 4  Exception Documentation - - - - - - -    GAD 7 : Generalized Anxiety Score 05/06/2021 04/21/2021 02/03/2021 07/19/2019  Nervous, Anxious, on Edge 2 0 0 3  Control/stop worrying 1 0 0 3  Worry too much - different things 1 0 0 3  Trouble relaxing 1 0 0 -  Restless 1 0 0 0  Easily annoyed or irritable 1 0 0 0  Afraid - awful might happen 1 0 0 3  Total GAD 7 Score 8 0 0 -  Anxiety Difficulty Somewhat difficult Somewhat difficult Not difficult at all Somewhat difficult   He states since his wife died he has had decreased appetite and is not sleeping well.   Hypertension: He has been taking a log of his blood pressures at home. He has been obtaining some low readings accompanied by lightheadedness. Other readings are 140s-150s/80s-90s. When he gets a low reading he still takes his blood pressure medication. He takes his blood pressure at irregular intervals throughout the day, but as many as three times per day, and sometimes before and after medication.    Review of Systems  Constitutional:  Positive for malaise/fatigue (fatigue) and weight loss (decreased appetite).  HENT:  Negative for congestion, ear discharge, ear pain, hearing loss and sore throat.   Eyes: Negative.   Respiratory:  Negative for cough, shortness of breath and wheezing.   Cardiovascular: Negative.  Negative for chest pain, orthopnea and leg swelling.  Gastrointestinal: Negative.  Negative for abdominal pain, diarrhea and heartburn.  Genitourinary: Negative.   Musculoskeletal: Negative.   Skin: Negative.   Neurological:  Positive for dizziness (intermittent with low BP). Negative for tingling, weakness and headaches.  Endo/Heme/Allergies: Negative.    Psychiatric/Behavioral:  Positive for depression (since wife passed earlier this month). Negative for memory loss, substance abuse and suicidal ideas. The patient has insomnia. The patient is not nervous/anxious.     Current Outpatient Medications:    albuterol (VENTOLIN HFA) 108 (90 Base) MCG/ACT inhaler, Inhale 2 puffs into the lungs every 6 (six) hours as needed for wheezing or shortness of breath., Disp: 18 g, Rfl: 2   amLODipine (NORVASC) 5 MG tablet, Take 1 tablet (5 mg total) by mouth daily., Disp: 90 tablet, Rfl: 1   aspirin 81 MG chewable tablet, Chew 81 mg by mouth daily., Disp: , Rfl:    atorvastatin (LIPITOR) 40 MG tablet, TAKE 1 TABLET BY MOUTH DAILY AT 6 PM., Disp: 90 tablet, Rfl: 0   azelastine (OPTIVAR) 0.05 % ophthalmic solution, Apply to eye., Disp: , Rfl:    clopidogrel (PLAVIX) 75 MG tablet, Take 1 tablet (75 mg total) by mouth daily., Disp: 90 tablet, Rfl: 1   Coenzyme Q10 (COQ-10) 100 MG CAPS, Take 100 mg by mouth daily., Disp: , Rfl:    empagliflozin (JARDIANCE) 25 MG TABS tablet, TAKE 1 TABLET BY MOUTH EVERY DAY BEFORE BREAKFAST, Disp: 90 tablet, Rfl: 1   isosorbide mononitrate (IMDUR) 30 MG 24 hr tablet, Take 0.5 tablets (  15 mg total) by mouth daily., Disp: 45 tablet, Rfl: 1   metoprolol tartrate (LOPRESSOR) 50 MG tablet, Take 1 tablet (50 mg total) by mouth 2 (two) times daily., Disp: 180 tablet, Rfl: 1   mirtazapine (REMERON) 7.5 MG tablet, Take 1 tablet (7.5 mg total) by mouth at bedtime., Disp: 30 tablet, Rfl: 2   nitroGLYCERIN (NITROSTAT) 0.4 MG SL tablet, DISSOLVE 1 TAB UNDER TONGUE AT ONSET OF CHEST PAIN. REPEAT EVERY 5 MINUTES FOR 2 DOSES AS NEEDED, Disp: 25 tablet, Rfl: 2   RESTASIS 0.05 % ophthalmic emulsion, 1 drop daily., Disp: , Rfl:    Simethicone (GAS-X PO), Take 1 tablet by mouth daily as needed (gas)., Disp: , Rfl:    XIIDRA 5 % SOLN, , Disp: , Rfl:    hydrALAZINE (APRESOLINE) 50 MG tablet, Take 1 tablet (50 mg total) by mouth every 8 (eight) hours.,  Disp: 270 tablet, Rfl: 1   hydrochlorothiazide (HYDRODIURIL) 12.5 MG tablet, Take 1 tablet (12.5 mg total) by mouth daily., Disp: 90 tablet, Rfl: 1   HYDROcodone-acetaminophen (NORCO) 7.5-325 MG tablet, Take 1 tablet by mouth every 8 (eight) hours., Disp: 90 tablet, Rfl: 0   [START ON 07/05/2021] HYDROcodone-acetaminophen (NORCO) 7.5-325 MG tablet, Take 1 tablet by mouth every 8 (eight) hours., Disp: 90 tablet, Rfl: 0   [START ON 06/05/2021] HYDROcodone-acetaminophen (NORCO) 7.5-325 MG tablet, Take 1 tablet by mouth every 8 (eight) hours., Disp: 90 tablet, Rfl: 0   losartan (COZAAR) 100 MG tablet, Take 1 tablet (100 mg total) by mouth daily., Disp: 90 tablet, Rfl: 1  No Known Allergies  Past Medical History:  Diagnosis Date   Arthritis    CAD (coronary artery disease)    a. 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 DES to left PDA 09/20/17 // c. 10/19: LCx and LPDA stents ok; s/p DES to pLAD and POBA to D1   Diabetes mellitus without complication ([place])    type 2    Heart attack ([place])    " a blood clot is actually what caused my heart attack in 2018 " , he states he is unsure where the blood clot originated from    Hyperlipidemia    Hypertension    Insomnia    Pulmonary hypertension ([place])    a. TTE 03/25/17; EF 60-65%, nl WM, LV diastolic fxn nl, trivial AI, RV cavity size, wall thickness, and sys fxn nl, trivial TR, PASP 39 mmHg, trivial pericardial effusion   SCCA (squamous cell carcinoma) of skin 03/26/2020   Mid Parietal Scalp (well diff)   Sleep apnea    could not use a cpap   Vitamin B12 deficiency 05/03/2020   Wears dentures    full top-partial bottom   Wears glasses     Past Surgical History:  Procedure Laterality Date   COLONOSCOPY  05/13/2010   Single anal papilla, 2 diminutive polyps at 10 cm, left-sided diverticula, one sigmoid and one a sending polyp,  otherwise normal colon and TI.  Path with 1 tubular adenoma, otherwise hyperplastic polyps.  Recommendations repeat colonoscopy in 7 years.   COLONOSCOPY WITH PROPOFOL N/A 11/22/2019   Procedure: COLONOSCOPY WITH PROPOFOL;  Surgeon: Daneil Dolin, MD;  [facility];  Service: Endoscopy;  Laterality: N/A;  9:15am   CORONARY ATHERECTOMY N/A 07/03/2018   Procedure: CORONARY ATHERECTOMY;  Surgeon: Burnell Blanks, MD;  Location:  [place] [facility];  Service: Cardiovascular;  Laterality: N/A;   CORONARY STENT INTERVENTION N/A 09/20/2017   Procedure: CORONARY STENT INTERVENTION;  Surgeon: Leonie Man, MD;  Location: [place] [facility];  Service: Cardiovascular;  Laterality: N/A;   CORONARY STENT INTERVENTION N/A 07/03/2018   Procedure: CORONARY STENT INTERVENTION;  Surgeon: Burnell Blanks, MD;  Location: [place] [facility];  Service: Cardiovascular;  Laterality: N/A;   CORONARY/GRAFT ACUTE MI REVASCULARIZATION N/A 03/25/2017   Procedure: Coronary/Graft Acute MI Revascularization;  Surgeon: Sherren Mocha, MD;  Location: [place] [facility];  Service: Cardiovascular;  Laterality: N/A;   GANGLION CYST EXCISION Left 07/25/2014   Procedure: EXCISION VOLAR AND DORSAL GANGLION LEFT WRIST;  Surgeon: Leanora Cover, MD;  Location: [place];  Service: Orthopedics;  Laterality: Left;   INTRAVASCULAR PRESSURE WIRE/FFR STUDY N/A 09/20/2017   Procedure: INTRAVASCULAR PRESSURE WIRE/FFR STUDY;  Surgeon: Leonie Man, MD;  Location: [place] [facility];  Service: Cardiovascular;  Laterality: N/A;   INTRAVASCULAR PRESSURE WIRE/FFR STUDY N/A 06/30/2018   Procedure: INTRAVASCULAR PRESSURE WIRE/FFR STUDY;  Surgeon: Nelva Bush, MD;  Location: [place] [facility];  Service: Cardiovascular;  Laterality: N/A;   KNEE ARTHROSCOPY     rifgr and left   LEFT HEART CATH AND CORONARY ANGIOGRAPHY N/A 03/25/2017   Procedure: Left Heart Cath and Coronary Angiography;  Surgeon: Sherren Mocha, MD;  Location: [place] [facility];  Service: Cardiovascular;  Laterality: N/A;   LEFT HEART CATH AND CORONARY ANGIOGRAPHY N/A 09/20/2017   Procedure: LEFT HEART CATH AND CORONARY ANGIOGRAPHY;  Surgeon: Leonie Man, MD;  Location: [place] [facility];  Service: Cardiovascular;  Laterality: N/A;   LEFT HEART CATH AND CORONARY ANGIOGRAPHY N/A 06/30/2018   Procedure: LEFT HEART CATH AND CORONARY ANGIOGRAPHY;  Surgeon: Nelva Bush, MD;  Location: [place] [facility];  Service: Cardiovascular;  Laterality: N/A;   POLYPECTOMY  11/22/2019   Procedure: POLYPECTOMY;  Surgeon: Daneil Dolin, MD;  [facility];  Service: Endoscopy;;   SHOULDER ARTHROSCOPY  2012   left   SHOULDER ARTHROSCOPY     right   TEMPORARY PACEMAKER N/A 03/25/2017   Procedure: Temporary Pacemaker;  Surgeon: Sherren Mocha, MD;  Location: [place] [facility];  Service: Cardiovascular;  Laterality: N/A;   TONSILLECTOMY     TOTAL HIP ARTHROPLASTY Right 04/25/2019   TOTAL HIP ARTHROPLASTY Right 04/25/2019   Procedure: TOTAL HIP ARTHROPLASTY ANTERIOR APPROACH;  Surgeon: Rod Can, MD;  Location: [facility];  Service: Orthopedics;  Laterality: Right;   TOTAL KNEE ARTHROPLASTY  2002   left    Family History  Problem Relation Age of Onset   Hypertension Mother    Hypertension Father    Hypertension Brother    Heart disease Maternal Grandfather    Colon cancer Neg Hx     Social History   Socioeconomic History   Marital status: Married    Spouse name: Not on file   Number of children: Not on file   Years of education: Not on file   Highest education level: Not on file  Occupational History   Occupation: Retired  Tobacco Use   Smoking status: Former    Packs/day: 2.00    Years: 30.00    Pack years: 60.00    Types: Cigarettes    Quit date: 03/25/2017    Years since quitting: 4.1   Smokeless tobacco: Never  Vaping Use   Vaping Use: Never used  Substance and Sexual Activity  Alcohol use: Yes     Comment: 3-6 shots of liquor daily   Drug use: Not Currently    Types: Marijuana    Comment: last used about 10 years ago (08/23/19)   Sexual activity: Not Currently  Other Topics Concern   Not on file  Social History Narrative   Per wife, pt was preparing to retire soon from his job at [employer].   Social Determinants of Health   Financial Resource Strain: Not on file  Food Insecurity: Not on file  Transportation Needs: Not on file  Physical Activity: Not on file  Stress: Not on file  Social Connections: Not on file  Intimate Partner Violence: Not on file      Objective:    BP (!) 99/59   Pulse 62   Temp 98 F (36.7 C) (Temporal)   Ht '5\' 9"'  (1.753 m)   Wt 87 kg   SpO2 93%   BMI 28.32 kg/m   Wt Readings from Last 3 Encounters:  05/06/21 87 kg  04/21/21 86.7 kg  02/03/21 91.1 kg    Physical Exam Vitals reviewed.  Constitutional:      General: He is not in acute distress.    Appearance: Normal appearance. He is obese. He is not ill-appearing, toxic-appearing or diaphoretic.  HENT:     Head: Normocephalic.     Right Ear: Tympanic membrane, ear canal and external ear normal.     Left Ear: Tympanic membrane, ear canal and external ear normal.     Nose: Nose normal.     Mouth/Throat:     Mouth: Mucous membranes are moist.     Pharynx: Oropharynx is clear.  Eyes:     Extraocular Movements: Extraocular movements intact.     Conjunctiva/sclera: Conjunctivae normal.     Pupils: Pupils are equal, round, and reactive to light.  Cardiovascular:     Rate and Rhythm: Normal rate and regular rhythm.     Pulses: Normal pulses.     Heart sounds: Normal heart sounds.  Pulmonary:     Effort: Pulmonary effort is normal.     Breath sounds: Normal breath sounds.  Abdominal:     General: Bowel sounds are normal. There is no distension.     Palpations: Abdomen is soft. There is no mass.     Tenderness: There is no abdominal tenderness.  Musculoskeletal:        General: No  swelling or tenderness. Normal range of motion.     Cervical back: Normal range of motion.  Skin:    General: Skin is warm and dry.     Capillary Refill: Capillary refill takes less than 2 seconds.  Neurological:     General: No focal deficit present.     Mental Status: He is alert and oriented to person, place, and time.     Motor: No weakness.  Psychiatric:        Mood and Affect: Mood normal.        Behavior: Behavior normal.        Thought Content: Thought content normal.        Judgment: Judgment normal.    No results found for: TSH Lab Results  Component Value Date   WBC 4.0 02/03/2021   HGB 13.7 02/03/2021   HCT 40.1 02/03/2021   MCV 92 02/03/2021   PLT 112 (L) 02/03/2021   Lab Results  Component Value Date   NA 143 02/03/2021   K 4.2 02/03/2021   CO2 22 02/03/2021  GLUCOSE 105 (H) 02/03/2021   BUN 24 02/03/2021   CREATININE 1.54 (H) 02/03/2021   BILITOT 0.5 02/03/2021   ALKPHOS 72 02/03/2021   AST 226 (H) 02/03/2021   ALT 128 (H) 02/03/2021   PROT 6.1 02/03/2021   ALBUMIN 4.0 02/03/2021   CALCIUM 8.8 02/03/2021   ANIONGAP 7 04/27/2019   EGFR 50 (L) 02/03/2021   Lab Results  Component Value Date   CHOL 148 02/03/2021   Lab Results  Component Value Date   HDL 82 02/03/2021   Lab Results  Component Value Date   LDLCALC 49 02/03/2021   Lab Results  Component Value Date   TRIG 93 02/03/2021   Lab Results  Component Value Date   CHOLHDL 1.8 02/03/2021   Lab Results  Component Value Date   HGBA1C 5.6 02/03/2021

## 2021-05-06 NOTE — Patient Instructions (Addendum)
Reminder: please return after mid-September for your flu shot. If you receive this elsewhere, such as your pharmacy, please let us know so we can get this documented in your chart.   Please schedule follow-up with cardiology.

## 2021-05-07 ENCOUNTER — Ambulatory Visit (INDEPENDENT_AMBULATORY_CARE_PROVIDER_SITE_OTHER): Payer: Medicare Other

## 2021-05-07 VITALS — Ht 69.0 in | Wt 191.0 lb

## 2021-05-07 DIAGNOSIS — Z0000 Encounter for general adult medical examination without abnormal findings: Secondary | ICD-10-CM

## 2021-05-07 LAB — CMP14+EGFR
ALT: 67 IU/L — ABNORMAL HIGH (ref 0–44)
AST: 150 IU/L — ABNORMAL HIGH (ref 0–40)
Albumin/Globulin Ratio: 2 (ref 1.2–2.2)
Albumin: 3.9 g/dL (ref 3.8–4.8)
Alkaline Phosphatase: 68 IU/L (ref 44–121)
BUN/Creatinine Ratio: 17 (ref 10–24)
BUN: 26 mg/dL (ref 8–27)
Bilirubin Total: 0.4 mg/dL (ref 0.0–1.2)
CO2: 16 mmol/L — ABNORMAL LOW (ref 20–29)
Calcium: 8.4 mg/dL — ABNORMAL LOW (ref 8.6–10.2)
Chloride: 103 mmol/L (ref 96–106)
Creatinine, Ser: 1.52 mg/dL — ABNORMAL HIGH (ref 0.76–1.27)
Globulin, Total: 2 g/dL (ref 1.5–4.5)
Glucose: 145 mg/dL — ABNORMAL HIGH (ref 65–99)
Potassium: 4.3 mmol/L (ref 3.5–5.2)
Sodium: 140 mmol/L (ref 134–144)
Total Protein: 5.9 g/dL — ABNORMAL LOW (ref 6.0–8.5)
eGFR: 50 mL/min/{1.73_m2} — ABNORMAL LOW (ref >59)

## 2021-05-07 NOTE — Patient Instructions (Signed)
[name] Foster , Thank you for taking time to come for your Medicare Wellness Visit. I appreciate your ongoing commitment to your health goals. Please review the following plan we discussed and let me know if I can assist you in the future.   Screening recommendations/referrals: Colonoscopy: Done 11/22/2019 - Repeat in 1 year (past due - make appointment when you are ready) Recommended yearly ophthalmology/optometry visit for glaucoma screening and checkup Recommended yearly dental visit for hygiene and checkup  Vaccinations: Influenza vaccine: Done 07/18/2020 - Repeat annually Pneumococcal vaccine: Prevnar Done 05/02/2020 - due for Pneumovax Tdap vaccine: Done 04/11/2019 - Repeat in 10 years Shingles vaccine: Done 05/29/2019 & 08/21/2019   Covid-19: Done 12/21/19, 01/21/20, & 08/30/2020  Advanced directives: Advance directive discussed with you today. Even though you declined this today, please call our office should you change your mind, and we can give you the proper paperwork for you to fill out.   Conditions/risks identified: Aim for 30 minutes of exercise or brisk walking each day, drink 6-8 glasses of water and eat lots of fruits and vegetables. Try to cut back on alcohol until you are able to quit completely.  Next appointment: Follow up in one year for your annual wellness visit.   Preventive Care 7 Years and Older, Male  Preventive care refers to lifestyle choices and visits with your health care provider that can promote health and wellness. What does preventive care include? A yearly physical exam. This is also called an annual well check. Dental exams once or twice a year. Routine eye exams. Ask your health care provider how often you should have your eyes checked. Personal lifestyle choices, including: Daily care of your teeth and gums. Regular physical activity. Eating a healthy diet. Avoiding tobacco and drug use. Limiting alcohol use. Practicing safe sex. Taking low doses of  aspirin every day. Taking vitamin and mineral supplements as recommended by your health care provider. What happens during an annual well check? The services and screenings done by your health care provider during your annual well check will depend on your age, overall health, lifestyle risk factors, and family history of disease. Counseling  Your health care provider may ask you questions about your: Alcohol use. Tobacco use. Drug use. Emotional well-being. Home and relationship well-being. Sexual activity. Eating habits. History of falls. Memory and ability to understand (cognition). Work and work Statistician. Screening  You may have the following tests or measurements: Height, weight, and BMI. Blood pressure. Lipid and cholesterol levels. These may be checked every 5 years, or more frequently if you are over 63 years old. Skin check. Lung cancer screening. You may have this screening every year starting at age 25 if you have a 30-pack-year history of smoking and currently smoke or have quit within the past 15 years. Fecal occult blood test (FOBT) of the stool. You may have this test every year starting at age 36. Flexible sigmoidoscopy or colonoscopy. You may have a sigmoidoscopy every 5 years or a colonoscopy every 10 years starting at age 40. Prostate cancer screening. Recommendations will vary depending on your family history and other risks. Hepatitis C blood test. Hepatitis B blood test. Sexually transmitted disease (STD) testing. Diabetes screening. This is done by checking your blood sugar (glucose) after you have not eaten for a while (fasting). You may have this done every 1-3 years. Abdominal aortic aneurysm (AAA) screening. You may need this if you are a current or former smoker. Osteoporosis. You may be screened starting at  age 27 if you are at high risk. Talk with your health care provider about your test results, treatment options, and if necessary, the need for more  tests. Vaccines  Your health care provider may recommend certain vaccines, such as: Influenza vaccine. This is recommended every year. Tetanus, diphtheria, and acellular pertussis (Tdap, Td) vaccine. You may need a Td booster every 10 years. Zoster vaccine. You may need this after age 8. Pneumococcal 13-valent conjugate (PCV13) vaccine. One dose is recommended after age 55. Pneumococcal polysaccharide (PPSV23) vaccine. One dose is recommended after age 20. Talk to your health care provider about which screenings and vaccines you need and how often you need them. This information is not intended to replace advice given to you by your health care provider. Make sure you discuss any questions you have with your health care provider. Document Released: 09/26/2015 Document Revised: 05/19/2016 Document Reviewed: 07/01/2015 Elsevier Interactive Patient Education  2017 [employer] Prevention in the Home Falls can cause injuries. They can happen to people of all ages. There are many things you can do to make your home safe and to help prevent falls. What can I do on the outside of my home? Regularly fix the edges of walkways and driveways and fix any cracks. Remove anything that might make you trip as you walk through a door, such as a raised step or threshold. Trim any bushes or trees on the path to your home. Use bright outdoor lighting. Clear any walking paths of anything that might make someone trip, such as rocks or tools. Regularly check to see if handrails are loose or broken. Make sure that both sides of any steps have handrails. Any raised decks and porches should have guardrails on the edges. Have any leaves, snow, or ice cleared regularly. Use sand or salt on walking paths during winter. Clean up any spills in your garage right away. This includes oil or grease spills. What can I do in the bathroom? Use night lights. Install grab bars by the toilet and in the tub and shower.  Do not use towel bars as grab bars. Use non-skid mats or decals in the tub or shower. If you need to sit down in the shower, use a plastic, non-slip stool. Keep the floor dry. Clean up any water that spills on the floor as soon as it happens. Remove soap buildup in the tub or shower regularly. Attach bath mats securely with double-sided non-slip rug tape. Do not have throw rugs and other things on the floor that can make you trip. What can I do in the bedroom? Use night lights. Make sure that you have a light by your bed that is easy to reach. Do not use any sheets or blankets that are too big for your bed. They should not hang down onto the floor. Have a firm chair that has side arms. You can use this for support while you get dressed. Do not have throw rugs and other things on the floor that can make you trip. What can I do in the kitchen? Clean up any spills right away. Avoid walking on wet floors. Keep items that you use a lot in easy-to-reach places. If you need to reach something above you, use a strong step stool that has a grab bar. Keep electrical cords out of the way. Do not use floor polish or wax that makes floors slippery. If you must use wax, use non-skid floor wax. Do not have throw rugs and  other things on the floor that can make you trip. What can I do with my stairs? Do not leave any items on the stairs. Make sure that there are handrails on both sides of the stairs and use them. Fix handrails that are broken or loose. Make sure that handrails are as long as the stairways. Check any carpeting to make sure that it is firmly attached to the stairs. Fix any carpet that is loose or worn. Avoid having throw rugs at the top or bottom of the stairs. If you do have throw rugs, attach them to the floor with carpet tape. Make sure that you have a light switch at the top of the stairs and the bottom of the stairs. If you do not have them, ask someone to add them for you. What else  can I do to help prevent falls? Wear shoes that: Do not have high heels. Have rubber bottoms. Are comfortable and fit you well. Are closed at the toe. Do not wear sandals. If you use a stepladder: Make sure that it is fully opened. Do not climb a closed stepladder. Make sure that both sides of the stepladder are locked into place. Ask someone to hold it for you, if possible. Clearly mark and make sure that you can see: Any grab bars or handrails. First and last steps. Where the edge of each step is. Use tools that help you move around (mobility aids) if they are needed. These include: Canes. Walkers. Scooters. Crutches. Turn on the lights when you go into a dark area. Replace any light bulbs as soon as they burn out. Set up your furniture so you have a clear path. Avoid moving your furniture around. If any of your floors are uneven, fix them. If there are any pets around you, be aware of where they are. Review your medicines with your doctor. Some medicines can make you feel dizzy. This can increase your chance of falling. Ask your doctor what other things that you can do to help prevent falls. This information is not intended to replace advice given to you by your health care provider. Make sure you discuss any questions you have with your health care provider. Document Released: 06/26/2009 Document Revised: 02/05/2016 Document Reviewed: 10/04/2014 Elsevier Interactive Patient Education  2017 [employer].

## 2021-05-07 NOTE — Progress Notes (Signed)
Subjective:   [name] Foster is a 65 y.o. male who presents for Medicare Annual/Subsequent preventive examination.  Review of Systems     Cardiac Risk Factors include: advanced age (>5mn, >>70women);male gender;sedentary lifestyle;hypertension     Objective:    Today's Vitals   05/07/21 1415  Weight: 191 lb (86.6 kg)  Height: '5\' 9"'$  (1.753 m)   Body mass index is 28.21 kg/m.  Advanced Directives 05/07/2021 11/19/2019 04/25/2019 04/19/2019 08/28/2018 06/30/2018 06/30/2018  Does Patient Have a Medical Advance Directive? No No No No No Yes Yes  Type of Advance Directive - - - - - Living will Living will  Does patient want to make changes to medical advance directive? - - - - - No - Patient declined -  Copy of HHomein CBieber Would patient like information on creating a medical advance directive? No - Patient declined No - Patient declined No - Patient declined No - Patient declined No - Patient declined - -    Current Medications (verified) Outpatient Encounter Medications as of 05/07/2021  Medication Sig   albuterol (VENTOLIN HFA) 108 (90 Base) MCG/ACT inhaler Inhale 2 puffs into the lungs every 6 (six) hours as needed for wheezing or shortness of breath.   amLODipine (NORVASC) 5 MG tablet Take 1 tablet (5 mg total) by mouth daily.   aspirin 81 MG chewable tablet Chew 81 mg by mouth daily.   atorvastatin (LIPITOR) 40 MG tablet TAKE 1 TABLET BY MOUTH DAILY AT 6 PM.   azelastine (OPTIVAR) 0.05 % ophthalmic solution Apply to eye.   clopidogrel (PLAVIX) 75 MG tablet Take 1 tablet (75 mg total) by mouth daily.   Coenzyme Q10 (COQ-10) 100 MG CAPS Take 100 mg by mouth daily.   empagliflozin (JARDIANCE) 25 MG TABS tablet TAKE 1 TABLET BY MOUTH EVERY DAY BEFORE BREAKFAST   hydrALAZINE (APRESOLINE) 50 MG tablet Take 1 tablet (50 mg total) by mouth every 8 (eight) hours.   hydrochlorothiazide (HYDRODIURIL) 12.5 MG tablet Take 1 tablet (12.5 mg total) by  mouth daily.   HYDROcodone-acetaminophen (NORCO) 7.5-325 MG tablet Take 1 tablet by mouth every 8 (eight) hours.   [START ON 07/05/2021] HYDROcodone-acetaminophen (NORCO) 7.5-325 MG tablet Take 1 tablet by mouth every 8 (eight) hours.   [START ON 06/05/2021] HYDROcodone-acetaminophen (NORCO) 7.5-325 MG tablet Take 1 tablet by mouth every 8 (eight) hours.   isosorbide mononitrate (IMDUR) 30 MG 24 hr tablet Take 0.5 tablets (15 mg total) by mouth daily.   losartan (COZAAR) 100 MG tablet Take 1 tablet (100 mg total) by mouth daily.   metoprolol tartrate (LOPRESSOR) 50 MG tablet Take 1 tablet (50 mg total) by mouth 2 (two) times daily.   mirtazapine (REMERON) 7.5 MG tablet Take 1 tablet (7.5 mg total) by mouth at bedtime.   nitroGLYCERIN (NITROSTAT) 0.4 MG SL tablet DISSOLVE 1 TAB UNDER TONGUE AT ONSET OF CHEST PAIN. REPEAT EVERY 5 MINUTES FOR 2 DOSES AS NEEDED   RESTASIS 0.05 % ophthalmic emulsion 1 drop daily.   Simethicone (GAS-X PO) Take 1 tablet by mouth daily as needed (gas).   XIIDRA 5 % SOLN    No facility-administered encounter medications on file as of 05/07/2021.    Allergies (verified) Patient has no known allergies.   History: Past Medical History:  Diagnosis Date   Arthritis    CAD (coronary artery disease)    a. inferoposterior STEMI 03/25/17: LHC LM calcified w/o stenosis, pLAD  50%, m-dLCx 99% s/p PCI/DES w/ Promus 3.5 x 24 mm DES, ostial OM1 50%, small RCA with mRCA 50%; b. DES to left PDA 09/20/17 // c. 10/19: LCx and LPDA stents ok; s/p DES to pLAD and POBA to D1   Diabetes mellitus without complication ([place])    type 2    Heart attack ([place])    " a blood clot is actually what caused my heart attack in 2018 " , he states he is unsure where the blood clot originated from    Hyperlipidemia    Hypertension    Insomnia    Pulmonary hypertension ([place])    a. TTE 03/25/17; EF 60-65%, nl WM, LV diastolic fxn nl, trivial AI, RV cavity size, wall thickness, and sys fxn nl, trivial TR,  PASP 39 mmHg, trivial pericardial effusion   SCCA (squamous cell carcinoma) of skin 03/26/2020   Mid Parietal Scalp (well diff)   Sleep apnea    could not use a cpap   Vitamin B12 deficiency 05/03/2020   Wears dentures    full top-partial bottom   Wears glasses    Past Surgical History:  Procedure Laterality Date   COLONOSCOPY  05/13/2010   Single anal papilla, 2 diminutive polyps at 10 cm, left-sided diverticula, one sigmoid and one a sending polyp, otherwise normal colon and TI.  Path with 1 tubular adenoma, otherwise hyperplastic polyps.  Recommendations repeat colonoscopy in 7 years.   COLONOSCOPY WITH PROPOFOL N/A 11/22/2019   Procedure: COLONOSCOPY WITH PROPOFOL;  Surgeon: Daneil Dolin, MD;  [facility];  Service: Endoscopy;  Laterality: N/A;  9:15am   CORONARY ATHERECTOMY N/A 07/03/2018   Procedure: CORONARY ATHERECTOMY;  Surgeon: Burnell Blanks, MD;  Location: [place] [facility];  Service: Cardiovascular;  Laterality: N/A;   CORONARY STENT INTERVENTION N/A 09/20/2017   Procedure: CORONARY STENT INTERVENTION;  Surgeon: Leonie Man, MD;  Location: [place] [facility];  Service: Cardiovascular;  Laterality: N/A;   CORONARY STENT INTERVENTION N/A 07/03/2018   Procedure: CORONARY STENT INTERVENTION;  Surgeon: Burnell Blanks, MD;  Location: [place] [facility];  Service: Cardiovascular;  Laterality: N/A;   CORONARY/GRAFT ACUTE MI REVASCULARIZATION N/A 03/25/2017   Procedure: Coronary/Graft Acute MI Revascularization;  Surgeon: Sherren Mocha, MD;  Location: [place] [facility];  Service: Cardiovascular;  Laterality: N/A;   GANGLION CYST EXCISION Left 07/25/2014   Procedure: EXCISION VOLAR AND DORSAL GANGLION LEFT WRIST;  Surgeon: Leanora Cover, MD;  Location: [facility];  Service: Orthopedics;  Laterality: Left;   INTRAVASCULAR PRESSURE WIRE/FFR STUDY N/A 09/20/2017   Procedure: INTRAVASCULAR PRESSURE WIRE/FFR STUDY;  Surgeon: Leonie Man,  MD;  Location: [facility] [facility];  Service: Cardiovascular;  Laterality: N/A;   INTRAVASCULAR PRESSURE WIRE/FFR STUDY N/A 06/30/2018   Procedure: INTRAVASCULAR PRESSURE WIRE/FFR STUDY;  Surgeon: Nelva Bush, MD;  Location: [place] [facility];  Service: Cardiovascular;  Laterality: N/A;   KNEE ARTHROSCOPY     rifgr and left   LEFT HEART CATH AND CORONARY ANGIOGRAPHY N/A 03/25/2017   Procedure: Left Heart Cath and Coronary Angiography;  Surgeon: Sherren Mocha, MD;  Location: [place] [facility];  Service: Cardiovascular;  Laterality: N/A;   LEFT HEART CATH AND CORONARY ANGIOGRAPHY N/A 09/20/2017   Procedure: LEFT HEART CATH AND CORONARY ANGIOGRAPHY;  Surgeon: Leonie Man, MD;  Location: [place] [facility] [place] [facility];  Service: Cardiovascular;  Laterality: N/A;   LEFT HEART CATH AND CORONARY ANGIOGRAPHY N/A 06/30/2018   Procedure: LEFT HEART CATH  AND CORONARY ANGIOGRAPHY;  Surgeon: Nelva Bush, MD;  Location: [place] [facility];  Service: Cardiovascular;  Laterality: N/A;   POLYPECTOMY  11/22/2019   Procedure: POLYPECTOMY;  Surgeon: Daneil Dolin, MD;  [facility];  Service: Endoscopy;;   SHOULDER ARTHROSCOPY  2012   left   SHOULDER ARTHROSCOPY     right   TEMPORARY PACEMAKER N/A 03/25/2017   Procedure: Temporary Pacemaker;  Surgeon: Sherren Mocha, MD;  Location: [place] [facility];  Service: Cardiovascular;  Laterality: N/A;   TONSILLECTOMY     TOTAL HIP ARTHROPLASTY Right 04/25/2019   TOTAL HIP ARTHROPLASTY Right 04/25/2019   Procedure: TOTAL HIP ARTHROPLASTY ANTERIOR APPROACH;  Surgeon: Rod Can, MD;  Location[facility];  Service: Orthopedics;  Laterality: Right;   TOTAL KNEE ARTHROPLASTY  2002   left   Family History  Problem Relation Age of Onset   Hypertension Mother    Hypertension Father    Hypertension Brother    Heart disease Maternal Grandfather    Colon cancer Neg Hx    Social History   Socioeconomic History   Marital status: Widowed    Spouse name:  Not on file   Number of children: 2   Years of education: Not on file   Highest education level: Not on file  Occupational History   Occupation: Retired  Tobacco Use   Smoking status: Former    Packs/day: 2.00    Years: 30.00    Pack years: 60.00    Types: Cigarettes    Quit date: 03/25/2017    Years since quitting: 4.1   Smokeless tobacco: Never  Vaping Use   Vaping Use: Never used  Substance and Sexual Activity   Alcohol use: Yes    Comment: 3-6 shots of liquor daily   Drug use: Not Currently    Types: Marijuana    Comment: last used about 10 years ago (08/23/19)   Sexual activity: Not Currently  Other Topics Concern   Not on file  Social History Narrative   Per wife, pt was preparing to retire soon from his job at [employer].   13-May-2021 - wife passed away a couple weeks ago - he lives alone, but has lot of family nearby   Son next door, daughter lives in [place] Resource Strain: Low Risk    Difficulty of Paying Living Expenses: Not hard at all  Food Insecurity: No Food Insecurity   Worried About Charity fundraiser in the Last Year: Never true   Arboriculturist in the Last Year: Never true  Transportation Needs: No Transportation Needs   Lack of Transportation (Medical): No   Lack of Transportation (Non-Medical): No  Physical Activity: Insufficiently Active   Days of Exercise per Week: 5 days   Minutes of Exercise per Session: 20 min  Stress: Stress Concern Present   Feeling of Stress : Rather much  Social Connections: Socially Isolated   Frequency of Communication with Friends and Family: More than three times a week   Frequency of Social Gatherings with Friends and Family: More than three times a week   Attends Religious Services: Never   Marine scientist or Organizations: No   Attends Archivist Meetings: Never   Marital Status: Widowed    Tobacco Counseling Counseling given: Not Answered   Clinical  Intake:  Pre-visit preparation completed: Yes  Pain : No/denies pain     BMI - recorded:  28.21 Nutritional Status: BMI 25 -29 Overweight Nutritional Risks: Nausea/ vomitting/ diarrhea (intermittent nausea - he feels is related to a medication) Diabetes: No  How often do you need to have someone help you when you read instructions, pamphlets, or other written materials from your doctor or pharmacy?: 1 - Never  Diabetic? No  Interpreter Needed?: No  Information entered by :: Elisia Stepp, LPN   Activities of Daily Living In your present state of health, do you have any difficulty performing the following activities: 05/07/2021  Hearing? N  Vision? N  Difficulty concentrating or making decisions? Y  Walking or climbing stairs? N  Dressing or bathing? N  Doing errands, shopping? N  Preparing Food and eating ? N  Using the Toilet? N  In the past six months, have you accidently leaked urine? N  Do you have problems with loss of bowel control? N  Managing your Medications? N  Managing your Finances? N  Housekeeping or managing your Housekeeping? N  Some recent data might be hidden    Patient Care Team: [name], FNP as PCP - General (Family Medicine) Sherren Mocha, MD as PCP - Cardiology (Cardiology) [facility] as Consulting Physician (Orthopedic Surgery) Lavonna Monarch, MD as Consulting Physician (Dermatology) Sharmon Revere as Physician Assistant (Cardiology) Harlen Labs, MD as Referring Physician (Optometry)  Indicate any recent Medical Services you may have received from other than [facility] providers in the past year (date may be approximate).     Assessment:   This is a routine wellness examination for Wafi.  Hearing/Vision screen Hearing Screening - Comments:: Denies hearing difficulties  Vision Screening - Comments:: Wears eyeglasses - up to date with annual eye exams with Dr Marin Comment in [place]  Dietary issues and exercise activities  discussed: Current Exercise Habits: Home exercise routine, Type of exercise: walking, Time (Minutes): 20, Frequency (Times/Week): 5, Weekly Exercise (Minutes/Week): 100, Intensity: Mild, Exercise limited by: cardiac condition(s)   Goals Addressed             This Visit's Progress    Patient Stated       Try to cut back and eventually quit drinking alcohol.       Depression Screen PHQ 2/9 Scores 05/07/2021 05/06/2021 04/21/2021 02/03/2021 08/04/2020 05/02/2020 01/31/2020  PHQ - 2 Score '3 6 4 '$ 0 0 0 0  PHQ- 9 Score '11 17 9 2 '$ 0 - -  Exception Documentation - - - - - - -    Fall Risk Fall Risk  05/07/2021 05/06/2021 04/21/2021 02/03/2021 08/04/2020  Falls in the past year? '1 1 1 '$ 0 1  Number falls in past yr: 0 0 0 - 0  Injury with Fall? 0 0 0 - 0  Risk for fall due to : Impaired vision;Medication side effect;Orthopedic patient - - - History of fall(s)  Risk for fall due to: Comment - - - - -  Follow up Education provided;Falls prevention discussed Falls prevention discussed Falls prevention discussed - Falls prevention discussed    FALL RISK PREVENTION PERTAINING TO THE HOME:  Any stairs in or around the home? Yes  If so, are there any without handrails? No  Home free of loose throw rugs in walkways, pet beds, electrical cords, etc? Yes  Adequate lighting in your home to reduce risk of falls? Yes   ASSISTIVE DEVICES UTILIZED TO PREVENT FALLS:  Life alert? No  Use of a cane, walker or w/c? No  Grab bars in the bathroom? No  Shower  chair or bench in shower? No  Elevated toilet seat or a handicapped toilet? No   TIMED UP AND GO:  Was the test performed? No . Telephonic visit  Cognitive Function:     6CIT Screen 05/07/2021  What Year? 0 points  What month? 0 points  What time? 0 points  Count back from 20 0 points  Months in reverse 2 points  Repeat phrase 0 points  Total Score 2    Immunizations Immunization History  Administered Date(s) Administered   Fluad Quad(high  Dose 65+) 07/19/2019   Hep A / Hep B 10/30/2019, 11/27/2019, 04/29/2020   Influenza,inj,Quad PF,6+ Mos 07/01/2018   Influenza-Unspecified 06/13/2018, 07/18/2020   Moderna Sars-Covid-2 Vaccination 12/21/2019, 01/21/2020, 08/30/2020   Pneumococcal Conjugate-13 05/02/2020   Tdap 04/11/2019   Zoster Recombinat (Shingrix) 05/29/2019, 08/21/2019    TDAP status: Up to date  Flu Vaccine status: Up to date  Pneumococcal vaccine status: Up to date  Covid-19 vaccine status: Information provided on how to obtain vaccines.   Qualifies for Shingles Vaccine? Yes   Zostavax completed Yes   Shingrix Completed?: Yes  Screening Tests Health Maintenance  Topic Date Due   OPHTHALMOLOGY EXAM  11/20/2019   COVID-19 Vaccine (4 - Booster for Moderna series) 05/07/2021 (Originally 11/28/2020)   INFLUENZA VACCINE  06/07/2021 (Originally 04/13/2021)   COLONOSCOPY (Pts 45-67yr Insurance coverage will need to be confirmed)  04/21/2022 (Originally 11/21/2020)   PNA vac Low Risk Adult (2 of 2 - PPSV23) 05/06/2022 (Originally 05/02/2021)   HEMOGLOBIN A1C  08/06/2021   FOOT EXAM  05/06/2022   TETANUS/TDAP  04/10/2029   Hepatitis C Screening  Completed   Zoster Vaccines- Shingrix  Completed   HPV VACCINES  Aged Out    Health Maintenance  Health Maintenance Due  Topic Date Due   OPHTHALMOLOGY EXAM  11/20/2019    Colorectal cancer screening: Type of screening: Colonoscopy. Completed 11/22/2019. Repeat every 1 years he just lost his wife - wants to postpone for a while  Lung Cancer Screening: (Low Dose CT Chest recommended if Age 10939-80years, 30 pack-year currently smoking OR have quit w/in 15years.) does qualify.   Lung Cancer Screening Referral:  60pack year hx, quit 2018 - He wants to post-pone for now, but may consider later  Additional Screening:  Hepatitis C Screening: does qualify; Completed 04/11/2019  Vision Screening: Recommended annual ophthalmology exams for early detection of glaucoma and  other disorders of the eye. Is the patient up to date with their annual eye exam?  Yes  Who is the provider or what is the name of the office in which the patient attends annual eye exams? Y[facility] SarIf pt is not established with a provider, would they like to be referred to a provider to establish care? No .   Dental Screening: Recommended annual dental exams for proper oral hygiene  Community Resource Referral / Chronic Care Management: CRR required this visit?  No   CCM required this visit?  No      Plan:     I have personally reviewed and noted the following in the patient's chart:   Medical and social history Use of alcohol, tobacco or illicit drugs  Current medications and supplements including opioid prescriptions. Patient is currently taking opioid prescriptions. Information provided to patient regarding non-opioid alternatives. Patient advised to discuss non-opioid treatment plan with their provider. Functional ability and status Nutritional status Physical activity Advanced directives List of other physicians Hospitalizations, surgeries, and ER visits in previous 12 months  Vitals Screenings to include cognitive, depression, and falls Referrals and appointments  In addition, I have reviewed and discussed with patient certain preventive protocols, quality metrics, and best practice recommendations. A written personalized care plan for preventive services as well as general preventive health recommendations were provided to patient.     Sandrea Hammond, LPN   [id number]   Nurse Notes: None

## 2021-05-11 LAB — TOXASSURE SELECT 13 (MW), URINE

## 2021-05-11 NOTE — Progress Notes (Signed)
Virtual Visit via Telephone Note  I connected with  [name] on 05/07/21 at  2:45 PM EDT by telephone and verified that I am speaking with the correct person using two identifiers.  Location: Patient: home Provider: [facility] Persons participating in the virtual visit: patient/Nurse Health Advisor   I discussed the limitations, risks, security and privacy concerns of performing an evaluation and management service by telephone and the availability of in person appointments. The patient expressed understanding and agreed to proceed.  Interactive audio and video telecommunications were attempted between this nurse and patient, however failed, due to patient having technical difficulties OR patient did not have access to video capability.  We continued and completed visit with audio only.  Some vital signs may be absent or patient reported.   Asli Tokarski Dionne Ano, LPN

## 2021-05-21 ENCOUNTER — Other Ambulatory Visit: Payer: Self-pay | Admitting: Family Medicine

## 2021-05-21 LAB — SPECIMEN STATUS REPORT

## 2021-05-21 LAB — CANNABIDIOL (CBD)/TETRAHYDROCANNABINOL (THC) RATIO, URINE: CBD:THC Ratio: <1 RATIO

## 2021-05-28 ENCOUNTER — Other Ambulatory Visit: Payer: Self-pay | Admitting: Family Medicine

## 2021-05-28 DIAGNOSIS — R630 Anorexia: Secondary | ICD-10-CM

## 2021-05-28 DIAGNOSIS — F4321 Adjustment disorder with depressed mood: Secondary | ICD-10-CM

## 2021-06-26 ENCOUNTER — Other Ambulatory Visit: Payer: Self-pay | Admitting: Family Medicine

## 2021-06-26 DIAGNOSIS — E785 Hyperlipidemia, unspecified: Secondary | ICD-10-CM

## 2021-07-07 ENCOUNTER — Encounter: Payer: Self-pay | Admitting: Internal Medicine

## 2021-08-11 ENCOUNTER — Ambulatory Visit (INDEPENDENT_AMBULATORY_CARE_PROVIDER_SITE_OTHER): Payer: Medicare Other | Admitting: Family Medicine

## 2021-08-11 ENCOUNTER — Encounter: Payer: Self-pay | Admitting: Family Medicine

## 2021-08-11 VITALS — BP 130/79 | HR 67 | Temp 98.3°F | Ht 69.0 in | Wt 202.8 lb

## 2021-08-11 DIAGNOSIS — I2510 Atherosclerotic heart disease of native coronary artery without angina pectoris: Secondary | ICD-10-CM

## 2021-08-11 DIAGNOSIS — E1129 Type 2 diabetes mellitus with other diabetic kidney complication: Secondary | ICD-10-CM | POA: Diagnosis not present

## 2021-08-11 DIAGNOSIS — R0602 Shortness of breath: Secondary | ICD-10-CM

## 2021-08-11 DIAGNOSIS — G4733 Obstructive sleep apnea (adult) (pediatric): Secondary | ICD-10-CM

## 2021-08-11 DIAGNOSIS — R7989 Other specified abnormal findings of blood chemistry: Secondary | ICD-10-CM

## 2021-08-11 DIAGNOSIS — E785 Hyperlipidemia, unspecified: Secondary | ICD-10-CM

## 2021-08-11 DIAGNOSIS — N183 Chronic kidney disease, stage 3 unspecified: Secondary | ICD-10-CM | POA: Insufficient documentation

## 2021-08-11 DIAGNOSIS — I10 Essential (primary) hypertension: Secondary | ICD-10-CM

## 2021-08-11 DIAGNOSIS — D519 Vitamin B12 deficiency anemia, unspecified: Secondary | ICD-10-CM

## 2021-08-11 DIAGNOSIS — R809 Proteinuria, unspecified: Secondary | ICD-10-CM

## 2021-08-11 DIAGNOSIS — Z23 Encounter for immunization: Secondary | ICD-10-CM | POA: Diagnosis not present

## 2021-08-11 DIAGNOSIS — F4321 Adjustment disorder with depressed mood: Secondary | ICD-10-CM

## 2021-08-11 LAB — BAYER DCA HB A1C WAIVED: HB A1C (BAYER DCA - WAIVED): 6 % — ABNORMAL HIGH (ref 4.8–5.6)

## 2021-08-11 MED ORDER — CLOPIDOGREL BISULFATE 75 MG PO TABS: 75.0000 mg | ORAL_TABLET | Freq: Every day | ORAL | 1 refills | Status: DC

## 2021-08-11 MED ORDER — ALBUTEROL SULFATE HFA 108 (90 BASE) MCG/ACT IN AERS: 2.0000 | INHALATION_SPRAY | Freq: Four times a day (QID) | RESPIRATORY_TRACT | 2 refills | Status: DC | PRN

## 2021-08-11 NOTE — Progress Notes (Signed)
Assessment & Plan:  1. Type 2 diabetes mellitus with microalbuminuria, without long-term current use of insulin (HCC) Lab Results  Component Value Date   HGBA1C 6.0 (H) 08/11/2021   HGBA1C 5.6 02/03/2021   HGBA1C 5.8 11/04/2020    - Diabetes is at goal of A1c < 7. - Medications: continue current medications - Home glucose monitoring: Not checking - Patient is currently taking a statin. Patient is taking an ACE-inhibitor/ARB.  - Instruction/counseling given: reminded to get eye exam and discussed diet  Diabetes Health Maintenance Due  Topic Date Due   OPHTHALMOLOGY EXAM  11/20/2019   HEMOGLOBIN A1C  02/08/2022   FOOT EXAM  05/06/2022    Lab Results  Component Value Date   LABMICR 630.7 07/19/2019   - Lipid panel - CBC with Differential/Platelet - CMP14+EGFR - Bayer DCA Hb A1c Waived  2. Essential hypertension Well controlled on current regimen.  - Lipid panel - CBC with Differential/Platelet - CMP14+EGFR  3. Coronary artery disease involving native coronary artery of native heart without angina pectoris Continue statin. - Lipid panel - CMP14+EGFR - clopidogrel (PLAVIX) 75 MG tablet; Take 1 tablet (75 mg total) by mouth daily.  Dispense: 90 tablet; Refill: 1  4. Dyslipidemia, goal LDL below 70 Well controlled on current regimen.  - Lipid panel - CMP14+EGFR  5. Obstructive sleep apnea syndrome Intolerant to CPAP.  6. Anemia due to vitamin B12 deficiency, unspecified B12 deficiency type - CBC with Differential/Platelet  7. Elevated LFTs - CMP14+EGFR  8. Shortness of breath - albuterol (VENTOLIN HFA) 108 (90 Base) MCG/ACT inhaler; Inhale 2 puffs into the lungs every 6 (six) hours as needed for wheezing or shortness of breath.  Dispense: 18 g; Refill: 2  9. Grief reaction Well controlled on current regimen.   10. Immunization due Prevnar 20 given in office today.   Return in about 4 months (around 12/09/2021) for follow-up of chronic medication  conditions.  [name], MSN, APRN, FNP-C [facility]  Subjective:    Patient ID: [name] Foster, male    DOB: 08/04/1956, 65 y.o.   MRN: [id number]  Patient Care Team: [name], FNP as PCP - General (Family Medicine) Sherren Mocha, MD as PCP - Cardiology (Cardiology) [facility] as Consulting Physician (Orthopedic Surgery) Lavonna Monarch, MD as Consulting Physician (Dermatology) Sharmon Revere as Physician Assistant (Cardiology) Harlen Labs, MD as Referring Physician (Optometry)   Chief Complaint:  Chief Complaint  Patient presents with   Hypertension   Diabetes    3 month follow up of chronic medical conditions     HPI: [name] Foster is a 65 y.o. male presenting on 08/11/2021 for Hypertension and Diabetes (3 month follow up of chronic medical conditions )  Diabetes: Patient presents for follow up of diabetes. Current symptoms include: none. Known diabetic complications: none. Medication compliance: yes. Current diet:  eating out more often and therefore probably less healthy than previously . Current exercise: none. Home blood sugar records: patient does not check sugars. Is he  on ACE inhibitor or angiotensin II receptor blocker? Yes (Losartan). Is he on a statin? Yes (Atorvastatin).   Hypertension: patient states he does check his blood pressure at home and it is always good. He is not exercising.   CAD/Dyslipidemia: taking Atorvastatin and aspirin daily.   Sleep apnea: intolerant of CPAP.  New complaints: None   Social history:  Relevant past medical, surgical, family and social history reviewed and updated as indicated. Interim medical  history since our last visit reviewed.  Allergies and medications reviewed and updated.  DATA REVIEWED: CHART IN EPIC  ROS: Negative unless specifically indicated above in HPI.    Current Outpatient Medications:    albuterol (VENTOLIN HFA) 108 (90 Base) MCG/ACT inhaler, Inhale  2 puffs into the lungs every 6 (six) hours as needed for wheezing or shortness of breath., Disp: 18 g, Rfl: 2   amLODipine (NORVASC) 5 MG tablet, Take 1 tablet (5 mg total) by mouth daily., Disp: 90 tablet, Rfl: 1   aspirin 81 MG chewable tablet, Chew 81 mg by mouth daily., Disp: , Rfl:    atorvastatin (LIPITOR) 40 MG tablet, TAKE 1 TABLET BY MOUTH DAILY AT 6 PM., Disp: 90 tablet, Rfl: 0   azelastine (OPTIVAR) 0.05 % ophthalmic solution, Apply to eye., Disp: , Rfl:    clopidogrel (PLAVIX) 75 MG tablet, Take 1 tablet (75 mg total) by mouth daily., Disp: 90 tablet, Rfl: 1   Coenzyme Q10 (COQ-10) 100 MG CAPS, Take 100 mg by mouth daily., Disp: , Rfl:    empagliflozin (JARDIANCE) 25 MG TABS tablet, TAKE 1 TABLET BY MOUTH EVERY DAY BEFORE BREAKFAST, Disp: 90 tablet, Rfl: 1   hydrALAZINE (APRESOLINE) 50 MG tablet, Take 1 tablet (50 mg total) by mouth every 8 (eight) hours., Disp: 270 tablet, Rfl: 1   hydrochlorothiazide (HYDRODIURIL) 12.5 MG tablet, Take 1 tablet (12.5 mg total) by mouth daily., Disp: 90 tablet, Rfl: 1   isosorbide mononitrate (IMDUR) 30 MG 24 hr tablet, Take 0.5 tablets (15 mg total) by mouth daily., Disp: 45 tablet, Rfl: 1   losartan (COZAAR) 100 MG tablet, Take 1 tablet (100 mg total) by mouth daily., Disp: 90 tablet, Rfl: 1   metoprolol tartrate (LOPRESSOR) 50 MG tablet, Take 1 tablet (50 mg total) by mouth 2 (two) times daily., Disp: 180 tablet, Rfl: 1   mirtazapine (REMERON) 7.5 MG tablet, TAKE 1 TABLET BY MOUTH AT BEDTIME., Disp: 90 tablet, Rfl: 0   nitroGLYCERIN (NITROSTAT) 0.4 MG SL tablet, DISSOLVE 1 TAB UNDER TONGUE AT ONSET OF CHEST PAIN. REPEAT EVERY 5 MINUTES FOR 2 DOSES AS NEEDED, Disp: 25 tablet, Rfl: 2   RESTASIS 0.05 % ophthalmic emulsion, 1 drop daily., Disp: , Rfl:    Simethicone (GAS-X PO), Take 1 tablet by mouth daily as needed (gas)., Disp: , Rfl:    XIIDRA 5 % SOLN, , Disp: , Rfl:    No Known Allergies Past Medical History:  Diagnosis Date   Arthritis    CAD  (coronary artery disease)    a. 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 DES to left PDA 09/20/17 // c. 10/19: LCx and LPDA stents ok; s/p DES to pLAD and POBA to D1   Diabetes mellitus without complication ([place])    type 2    Heart attack ([place])    " a blood clot is actually what caused my heart attack in 2018 " , he states he is unsure where the blood clot originated from    Hyperlipidemia    Hypertension    Insomnia    Pulmonary hypertension ([place])    a. TTE 03/25/17; EF 60-65%, nl WM, LV diastolic fxn nl, trivial AI, RV cavity size, wall thickness, and sys fxn nl, trivial TR, PASP 39 mmHg, trivial pericardial effusion   SCCA (squamous cell carcinoma) of skin 03/26/2020   Mid  Parietal Scalp (well diff)   Sleep apnea    could not use a cpap   Vitamin B12 deficiency 05/03/2020   Wears dentures    full top-partial bottom   Wears glasses     Past Surgical History:  Procedure Laterality Date   COLONOSCOPY  05/13/2010   Single anal papilla, 2 diminutive polyps at 10 cm, left-sided diverticula, one sigmoid and one a sending polyp, otherwise normal colon and TI.  Path with 1 tubular adenoma, otherwise hyperplastic polyps.  Recommendations repeat colonoscopy in 7 years.   COLONOSCOPY WITH PROPOFOL N/A 11/22/2019   Procedure: COLONOSCOPY WITH PROPOFOL;  Surgeon: Daneil Dolin, MD;  [facility];  Service: Endoscopy;  Laterality: N/A;  9:15am   CORONARY ATHERECTOMY N/A 07/03/2018   Procedure: CORONARY ATHERECTOMY;  Surgeon: Burnell Blanks, MD;  Location: [place] [facility];  Service: Cardiovascular;  Laterality: N/A;   CORONARY STENT INTERVENTION N/A 09/20/2017   Procedure: CORONARY STENT INTERVENTION;  Surgeon: Leonie Man, MD;  Location: [place] [facility];  Service: Cardiovascular;  Laterality: N/A;   CORONARY STENT INTERVENTION N/A 07/03/2018    Procedure: CORONARY STENT INTERVENTION;  Surgeon: Burnell Blanks, MD;  Location: [place] [facility];  Service: Cardiovascular;  Laterality: N/A;   CORONARY/GRAFT ACUTE MI REVASCULARIZATION N/A 03/25/2017   Procedure: Coronary/Graft Acute MI Revascularization;  Surgeon: Sherren Mocha, MD;  Location: [place] [facility];  Service: Cardiovascular;  Laterality: N/A;   GANGLION CYST EXCISION Left 07/25/2014   Procedure: EXCISION VOLAR AND DORSAL GANGLION LEFT WRIST;  Surgeon: Leanora Cover, MD;  Location: [place];  Service: Orthopedics;  Laterality: Left;   INTRAVASCULAR PRESSURE WIRE/FFR STUDY N/A 09/20/2017   Procedure: INTRAVASCULAR PRESSURE WIRE/FFR STUDY;  Surgeon: Leonie Man, MD;  Location: [place] [facility];  Service: Cardiovascular;  Laterality: N/A;   INTRAVASCULAR PRESSURE WIRE/FFR STUDY N/A 06/30/2018   Procedure: INTRAVASCULAR PRESSURE WIRE/FFR STUDY;  Surgeon: Nelva Bush, MD;  Location: [place] [facility];  Service: Cardiovascular;  Laterality: N/A;   KNEE ARTHROSCOPY     rifgr and left   LEFT HEART CATH AND CORONARY ANGIOGRAPHY N/A 03/25/2017   Procedure: Left Heart Cath and Coronary Angiography;  Surgeon: Sherren Mocha, MD;  Location: [place] [facility];  Service: Cardiovascular;  Laterality: N/A;   LEFT HEART CATH AND CORONARY ANGIOGRAPHY N/A 09/20/2017   Procedure: LEFT HEART CATH AND CORONARY ANGIOGRAPHY;  Surgeon: Leonie Man, MD;  Location: [place] [facility];  Service: Cardiovascular;  Laterality: N/A;   LEFT HEART CATH AND CORONARY ANGIOGRAPHY N/A 06/30/2018   Procedure: LEFT HEART CATH AND CORONARY ANGIOGRAPHY;  Surgeon: Nelva Bush, MD;  Location: [place] [facility];  Service: Cardiovascular;  Laterality: N/A;   POLYPECTOMY  11/22/2019   Procedure: POLYPECTOMY;  Surgeon: Daneil Dolin, MD;  [facility];  Service: Endoscopy;;   SHOULDER ARTHROSCOPY  2012   left   SHOULDER ARTHROSCOPY     right   TEMPORARY PACEMAKER N/A  03/25/2017   Procedure: Temporary Pacemaker;  Surgeon: Sherren Mocha, MD;  Location: [place] [facility];  Service: Cardiovascular;  Laterality: N/A;   TONSILLECTOMY     TOTAL HIP ARTHROPLASTY Right 04/25/2019   TOTAL HIP ARTHROPLASTY Right 04/25/2019   Procedure: TOTAL HIP ARTHROPLASTY ANTERIOR APPROACH;  Surgeon: Rod Can, MD;  Location: [facility];  Service: Orthopedics;  Laterality: Right;   TOTAL KNEE ARTHROPLASTY  2002   left    Social History   Socioeconomic History  Marital status: Widowed    Spouse name: Not on file   Number of children: 2   Years of education: Not on file   Highest education level: Not on file  Occupational History   Occupation: Retired  Tobacco Use   Smoking status: Former    Packs/day: 2.00    Years: 30.00    Pack years: 60.00    Types: Cigarettes    Quit date: 03/25/2017    Years since quitting: 4.3   Smokeless tobacco: Never  Vaping Use   Vaping Use: Never used  Substance and Sexual Activity   Alcohol use: Yes    Comment: 3-6 shots of liquor daily   Drug use: Not Currently    Types: Marijuana    Comment: last used about 10 years ago (08/23/19)   Sexual activity: Not Currently  Other Topics Concern   Not on file  Social History Narrative   Per wife, pt was preparing to retire soon from his job at [employer].   06/05/21 - wife passed away a couple weeks ago - he lives alone, but has lot of family nearby   Son next door, daughter lives in [place] Resource Strain: Low Risk    Difficulty of Paying Living Expenses: Not hard at all  Food Insecurity: No Food Insecurity   Worried About Charity fundraiser in the Last Year: Never true   Arboriculturist in the Last Year: Never true  Transportation Needs: No Transportation Needs   Lack of Transportation (Medical): No   Lack of Transportation (Non-Medical): No  Physical Activity: Insufficiently Active   Days of Exercise per Week: 5 days   Minutes of  Exercise per Session: 20 min  Stress: Stress Concern Present   Feeling of Stress : Rather much  Social Connections: Socially Isolated   Frequency of Communication with Friends and Family: More than three times a week   Frequency of Social Gatherings with Friends and Family: More than three times a week   Attends Religious Services: Never   Marine scientist or Organizations: No   Attends Archivist Meetings: Never   Marital Status: Widowed  Human resources officer Violence: Not At Risk   Fear of Current or Ex-Partner: No   Emotionally Abused: No   Physically Abused: No   Sexually Abused: No        Objective:    BP 130/79   Pulse 67   Temp 98.3 F (36.8 C) (Temporal)   Ht '5\' 9"'  (1.753 m)   Wt 202 lb 12.8 oz (92 kg)   SpO2 92%   BMI 29.95 kg/m   Wt Readings from Last 3 Encounters:  08/11/21 202 lb 12.8 oz (92 kg)  06/05/2021 191 lb (86.6 kg)  05/06/21 191 lb 12.8 oz (87 kg)    Physical Exam Vitals reviewed.  Constitutional:      General: He is not in acute distress.    Appearance: Normal appearance. He is overweight. He is not ill-appearing, toxic-appearing or diaphoretic.  HENT:     Head: Normocephalic and atraumatic.  Eyes:     General: No scleral icterus.       Right eye: No discharge.        Left eye: No discharge.     Conjunctiva/sclera: Conjunctivae normal.  Cardiovascular:     Rate and Rhythm: Normal rate and regular rhythm.     Heart sounds: Normal heart sounds. No murmur heard.  No friction rub. No gallop.  Pulmonary:     Effort: Pulmonary effort is normal. No respiratory distress.     Breath sounds: Normal breath sounds. No stridor. No wheezing, rhonchi or rales.  Musculoskeletal:        General: Normal range of motion.     Cervical back: Normal range of motion.  Skin:    General: Skin is warm and dry.  Neurological:     Mental Status: He is alert and oriented to person, place, and time. Mental status is at baseline.  Psychiatric:         Mood and Affect: Mood normal.        Behavior: Behavior normal.        Thought Content: Thought content normal.        Judgment: Judgment normal.    No results found for: TSH Lab Results  Component Value Date   WBC 4.0 02/03/2021   HGB 13.7 02/03/2021   HCT 40.1 02/03/2021   MCV 92 02/03/2021   PLT 112 (L) 02/03/2021   Lab Results  Component Value Date   NA 140 05/06/2021   K 4.3 05/06/2021   CO2 16 (L) 05/06/2021   GLUCOSE 145 (H) 05/06/2021   BUN 26 05/06/2021   CREATININE 1.52 (H) 05/06/2021   BILITOT 0.4 05/06/2021   ALKPHOS 68 05/06/2021   AST 150 (H) 05/06/2021   ALT 67 (H) 05/06/2021   PROT 5.9 (L) 05/06/2021   ALBUMIN 3.9 05/06/2021   CALCIUM 8.4 (L) 05/06/2021   ANIONGAP 7 04/27/2019   EGFR 50 (L) 05/06/2021   Lab Results  Component Value Date   CHOL 148 02/03/2021   Lab Results  Component Value Date   HDL 82 02/03/2021   Lab Results  Component Value Date   LDLCALC 49 02/03/2021   Lab Results  Component Value Date   TRIG 93 02/03/2021   Lab Results  Component Value Date   CHOLHDL 1.8 02/03/2021   Lab Results  Component Value Date   HGBA1C 5.6 02/03/2021

## 2021-08-11 NOTE — Progress Notes (Signed)
Cardiology Office Note:    Date:  08/12/2021   ID:  [name] Foster, DOB [birth year], MRN [id number]  PCP:  [name], FNP   [facility] Providers Cardiologist:  Sherren Mocha, MD Cardiology APP:  Sharmon Revere     Referring MD: [name], FNP   Chief Complaint:  F/u for CAD    Patient Profile:   [name] Foster is a 65 y.o. male with:  Coronary artery disease  S/p Inf-Post STEMI 03/2017 >> PCI:  DES to LCx  S/p DES to the L PDA in 09/2017 2/2 to [place] S/p NSTEMI 06/2018 >> PCI:  Orbital atherectomy and DES to LAD Diabetes mellitus  Hypertension  Hyperlipidemia  Chronic kidney disease OSA (intol of CPAP)  S/p R THR in 05/13/19  History of Present Illness: [name] was last seen in 10/21.  He returns for f/u.  He is here alone.  Unfortunately, his wife passed away in 13-May-2023.  He was going to the maintenance program and cardiac rehab at [facility].  However, he has not been exercising as much.  Over the past year, he has noted shortness of breath with certain types of activities.  He sometimes gets short of breath at the top of a flight of stairs.  He does not feel that his shortness of breath is getting any worse.  He has not had any chest discomfort.  He has not had orthopnea, leg edema, syncope.      ASSESSMENT & PLAN:   Shortness of breath Over the past year, he has noted shortness of breath with certain activities.  This is not getting any worse.  It is not associated with chest pain or heart failure symptoms.  His wife passed away earlier this year and he has not been exercising as usual.  He does have a prior history of smoking.  I have recommended proceeding with an echocardiogram to reassess LV function as well as right heart pressures.  We will plan follow-up in 1 year.  He knows to follow-up sooner if his shortness of breath should get worse or if he develops chest discomfort.  CAD (coronary artery disease) History of inferoposterior ST elevation  myocardial infarction in July 2018 treated with drug-eluting stent to the LCx, DES to the left PDA in January 2019 and non-ST elevation myocardial infarction in October 2019 treated with drug-eluting stent to the LAD.  It has been more than 3 years since his last PCI.  He is doing well without chest discomfort to suggest angina.  He does have mild thrombocytopenia and notes easy bruising.  Therefore, I have recommended he stop taking aspirin.    -DC ASA  -Continue clopidogrel 75 mg daily.    -Continue atorvastatin 40 mg daily, isosorbide mononitrate 15 mg daily, metoprolol tartrate 50 mg twice daily.   -Follow-up in 1 year.  Essential hypertension Blood pressure is well controlled on a combination of amlodipine 5 mg daily, hydralazine 50 mg 3 times a day, HCTZ 12.5 mg daily, isosorbide 15 mg daily, losartan 100 mg daily, metoprolol tartrate 50 mg twice daily.  Dyslipidemia, goal LDL below 70 LDL is optimal.  Continue atorvastatin 40 mg daily.  CKD (chronic kidney disease) stage 3, GFR 30-59 ml/min (HCC) Recent creatinine stable.          Dispo:  Return in about 1 year (around 08/12/2022) for Routine Follow Up, w/ Dr. Burt Knack, or Richardson Dopp, PA-C.    Prior CV studies: PCI 07/03/18 Orbital  atherectomy + 2.5 x 15 mm Sierra DES to prox LAD   Cardiac Catheterization 06/30/18 LAD ost 25, prox 70 (DFR 0.71); D1 90 LCx mid stent patent, dist 30; OM2 60; LPDA ost stent 40 ISR RCA mid 50; AM 50 EF 55-65   Cardiac Catheterization 09/20/17 LAD ost 25, prox 65, FFR 0.83 LCx mid stent patent, dist 30, OM2 60, LPDA ost 60, 90 RCA mid 50; AM 50 EF 55-65 PCI:  2.25 x 28 mm Synergy DES to LPDA   Echo 03/25/17 EF 60-65, no RWMA, PASP 39, trivial eff   LHC 03/25/17 LAD prox 50 LCx mid 99; OM1 50 RCA mid 50 PCI: 3.5 x 24 mm Promus Premier DES to mid LCx   Myoview 3/12 EF 65, atten artifact     Past Medical History:  Diagnosis Date   Arthritis    CAD (coronary artery disease)    a.  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 DES to left PDA 09/20/17 // c. 10/19: LCx and LPDA stents ok; s/p DES to pLAD and POBA to D1   Diabetes mellitus without complication ([place])    type 2    Heart attack ([place])    " a blood clot is actually what caused my heart attack in 2018 " , he states he is unsure where the blood clot originated from    Hyperlipidemia    Hypertension    Insomnia    Pulmonary hypertension ([place])    a. TTE 03/25/17; EF 60-65%, nl WM, LV diastolic fxn nl, trivial AI, RV cavity size, wall thickness, and sys fxn nl, trivial TR, PASP 39 mmHg, trivial pericardial effusion   SCCA (squamous cell carcinoma) of skin 03/26/2020   Mid Parietal Scalp (well diff)   Sleep apnea    could not use a cpap   Vitamin B12 deficiency 05/03/2020   Wears dentures    full top-partial bottom   Wears glasses    Current Medications: Current Meds  Medication Sig   albuterol (VENTOLIN HFA) 108 (90 Base) MCG/ACT inhaler Inhale 2 puffs into the lungs every 6 (six) hours as needed for wheezing or shortness of breath.   amLODipine (NORVASC) 5 MG tablet Take 1 tablet (5 mg total) by mouth daily.   atorvastatin (LIPITOR) 40 MG tablet TAKE 1 TABLET BY MOUTH DAILY AT 6 PM.   clopidogrel (PLAVIX) 75 MG tablet Take 1 tablet (75 mg total) by mouth daily.   Coenzyme Q10 (COQ-10) 100 MG CAPS Take 100 mg by mouth daily.   empagliflozin (JARDIANCE) 25 MG TABS tablet TAKE 1 TABLET BY MOUTH EVERY DAY BEFORE BREAKFAST   hydrALAZINE (APRESOLINE) 50 MG tablet Take 1 tablet (50 mg total) by mouth every 8 (eight) hours.   hydrochlorothiazide (HYDRODIURIL) 12.5 MG tablet Take 1 tablet (12.5 mg total) by mouth daily.   isosorbide mononitrate (IMDUR) 30 MG 24 hr tablet Take 0.5 tablets (15 mg total) by mouth daily.   losartan (COZAAR) 100 MG tablet Take 1 tablet (100 mg total) by mouth daily.    metoprolol tartrate (LOPRESSOR) 50 MG tablet Take 1 tablet (50 mg total) by mouth 2 (two) times daily.   mirtazapine (REMERON) 7.5 MG tablet TAKE 1 TABLET BY MOUTH AT BEDTIME.   nitroGLYCERIN (NITROSTAT) 0.4 MG SL tablet DISSOLVE 1 TAB UNDER TONGUE AT ONSET OF CHEST PAIN. REPEAT EVERY 5 MINUTES FOR 2  DOSES AS NEEDED   RESTASIS 0.05 % ophthalmic emulsion 1 drop daily.   Simethicone (GAS-X PO) Take 1 tablet by mouth daily as needed (gas).   [DISCONTINUED] aspirin 81 MG chewable tablet Chew 81 mg by mouth daily.    Allergies:   Patient has no known allergies.   Social History   Tobacco Use   Smoking status: Former    Packs/day: 2.00    Years: 30.00    Pack years: 60.00    Types: Cigarettes    Quit date: 03/25/2017    Years since quitting: 4.3   Smokeless tobacco: Never  Vaping Use   Vaping Use: Never used  Substance Use Topics   Alcohol use: Yes    Comment: 3-6 shots of liquor daily   Drug use: Not Currently    Types: Marijuana    Comment: last used about 10 years ago (08/23/19)    Family Hx: The patient's family history includes Heart disease in his maternal grandfather; Hypertension in his brother, father, and mother. There is no history of Colon cancer.  Review of Systems  Constitutional: Negative for fever.  Cardiovascular:  Negative for claudication.  Respiratory:  Negative for cough and wheezing.   Hematologic/Lymphatic: Bruises/bleeds easily.  Gastrointestinal:  Negative for hematochezia.  Genitourinary:  Negative for hematuria.    EKGs/Labs/Other Test Reviewed:    EKG:  EKG is  ordered today.  The ekg ordered today demonstrates NSR, HR 74, normal axis, inferior Q waves, nonspecific ST-T wave changes, QTC 404, no change from prior tracing  Recent Labs: 08/11/2021: ALT 33; BUN 29; Creatinine, Ser 1.28; Hemoglobin 13.2; Platelets 102; Potassium 4.4; Sodium 138   Recent Lipid Panel Lab Results  Component Value Date/Time   CHOL 108 08/11/2021 01:40 PM   TRIG 108  08/11/2021 01:40 PM   HDL 54 08/11/2021 01:40 PM   LDLCALC 34 08/11/2021 01:40 PM    Risk Assessment/Calculations:          Physical Exam:    VS:  BP 130/60   Pulse 74   Ht 5\' 8"  (1.727 m)   Wt 201 lb 9.6 oz (91.4 kg)   SpO2 94%   BMI 30.65 kg/m     Wt Readings from Last 3 Encounters:  08/12/21 201 lb 9.6 oz (91.4 kg)  08/11/21 202 lb 12.8 oz (92 kg)  05/07/21 191 lb (86.6 kg)    Constitutional:      Appearance: Healthy appearance. Not in distress.  Neck:     Vascular: No JVR. JVD normal.  Pulmonary:     Effort: Pulmonary effort is normal.     Breath sounds: No wheezing. No rales.  Cardiovascular:     Normal rate. Regular rhythm. Normal S1. Normal S2.      Murmurs: There is no murmur.  Edema:    Peripheral edema absent.  Abdominal:     Palpations: Abdomen is soft.  Skin:    General: Skin is warm and dry.  Neurological:     General: No focal deficit present.     Mental Status: Alert and oriented to person, place and time.     Cranial Nerves: Cranial nerves are intact.       Medication Adjustments/Labs and Tests Ordered: Current medicines are reviewed at length with the patient today.  Concerns regarding medicines are outlined above.  Tests Ordered: Orders Placed This Encounter  Procedures   EKG 12-Lead   ECHOCARDIOGRAM COMPLETE   Medication Changes: No orders of the defined types were placed in  this encounter.  Signed, Richardson Dopp, PA-C  08/12/2021 9:33 AM    [facility] [place], [place], [place]  [address] Phone: [phone]; Fax: [phone]

## 2021-08-12 ENCOUNTER — Ambulatory Visit (INDEPENDENT_AMBULATORY_CARE_PROVIDER_SITE_OTHER): Payer: Medicare Other | Admitting: Physician Assistant

## 2021-08-12 ENCOUNTER — Encounter: Payer: Self-pay | Admitting: Physician Assistant

## 2021-08-12 ENCOUNTER — Other Ambulatory Visit: Payer: Self-pay

## 2021-08-12 VITALS — BP 130/60 | HR 74 | Ht 68.0 in | Wt 201.6 lb

## 2021-08-12 DIAGNOSIS — I10 Essential (primary) hypertension: Secondary | ICD-10-CM

## 2021-08-12 DIAGNOSIS — N1831 Chronic kidney disease, stage 3a: Secondary | ICD-10-CM

## 2021-08-12 DIAGNOSIS — E785 Hyperlipidemia, unspecified: Secondary | ICD-10-CM

## 2021-08-12 DIAGNOSIS — I2510 Atherosclerotic heart disease of native coronary artery without angina pectoris: Secondary | ICD-10-CM

## 2021-08-12 DIAGNOSIS — R0602 Shortness of breath: Secondary | ICD-10-CM

## 2021-08-12 LAB — CMP14+EGFR
ALT: 33 IU/L (ref 0–44)
AST: 44 IU/L — ABNORMAL HIGH (ref 0–40)
Albumin/Globulin Ratio: 1.8 (ref 1.2–2.2)
Albumin: 4.1 g/dL (ref 3.8–4.8)
Alkaline Phosphatase: 68 IU/L (ref 44–121)
BUN/Creatinine Ratio: 23 (ref 10–24)
BUN: 29 mg/dL — ABNORMAL HIGH (ref 8–27)
Bilirubin Total: 0.4 mg/dL (ref 0.0–1.2)
CO2: 19 mmol/L — ABNORMAL LOW (ref 20–29)
Calcium: 8.9 mg/dL (ref 8.6–10.2)
Chloride: 103 mmol/L (ref 96–106)
Creatinine, Ser: 1.28 mg/dL — ABNORMAL HIGH (ref 0.76–1.27)
Globulin, Total: 2.3 g/dL (ref 1.5–4.5)
Glucose: 161 mg/dL — ABNORMAL HIGH (ref 70–99)
Potassium: 4.4 mmol/L (ref 3.5–5.2)
Sodium: 138 mmol/L (ref 134–144)
Total Protein: 6.4 g/dL (ref 6.0–8.5)
eGFR: 62 mL/min/{1.73_m2} (ref >59)

## 2021-08-12 LAB — CBC WITH DIFFERENTIAL/PLATELET
Basophils Absolute: 0 10*3/uL (ref 0.0–0.2)
Basos: 1 %
EOS (ABSOLUTE): 0.1 10*3/uL (ref 0.0–0.4)
Eos: 2 %
Hematocrit: 40.5 % (ref 37.5–51.0)
Hemoglobin: 13.2 g/dL (ref 13.0–17.7)
Immature Grans (Abs): 0.1 10*3/uL (ref 0.0–0.1)
Immature Granulocytes: 1 %
Lymphocytes Absolute: 0.9 10*3/uL (ref 0.7–3.1)
Lymphs: 15 %
MCH: 30.1 pg (ref 26.6–33.0)
MCHC: 32.6 g/dL (ref 31.5–35.7)
MCV: 93 fL (ref 79–97)
Monocytes Absolute: 0.4 10*3/uL (ref 0.1–0.9)
Monocytes: 7 %
Neutrophils Absolute: 4.3 10*3/uL (ref 1.4–7.0)
Neutrophils: 74 %
Platelets: 102 10*3/uL — ABNORMAL LOW (ref 150–450)
RBC: 4.38 x10E6/uL (ref 4.14–5.80)
RDW: 13.9 % (ref 11.6–15.4)
WBC: 5.9 10*3/uL (ref 3.4–10.8)

## 2021-08-12 LAB — LIPID PANEL
Chol/HDL Ratio: 2 ratio (ref 0.0–5.0)
Cholesterol, Total: 108 mg/dL (ref 100–199)
HDL: 54 mg/dL (ref >39)
LDL Chol Calc (NIH): 34 mg/dL (ref 0–99)
Triglycerides: 108 mg/dL (ref 0–149)
VLDL Cholesterol Cal: 20 mg/dL (ref 5–40)

## 2021-08-12 NOTE — Patient Instructions (Signed)
Medication Instructions:  1.Stop Aspirin 81 mg *If you need a refill on your cardiac medications before your next appointment, please call your pharmacy*   Lab Work: None If you have labs (blood work) drawn today and your tests are completely normal, you will receive your results only by: [place] (if you have MyChart) OR A paper copy in the mail If you have any lab test that is abnormal or we need to change your treatment, we will call you to review the results.   Testing/Procedures: Your physician has requested that you have an echocardiogram. Echocardiography is a painless test that uses sound waves to create images of your heart. It provides your doctor with information about the size and shape of your heart and how well your heart's chambers and valves are working. This procedure takes approximately one hour. There are no restrictions for this procedure.    Follow-Up: At [facility], you and your health needs are our priority.  As part of our continuing mission to provide you with exceptional heart care, we have created designated Provider Care Teams.  These Care Teams include your primary Cardiologist (physician) and Advanced Practice Providers (APPs -  Physician Assistants and Nurse Practitioners) who all work together to provide you with the care you need, when you need it.    Your next appointment:   1 year(s)  The format for your next appointment:   In Person  Provider:   Sherren Mocha, MD  or Richardson Dopp, PA-C

## 2021-08-12 NOTE — Assessment & Plan Note (Signed)
LDL is optimal.  Continue atorvastatin 40 mg daily.

## 2021-08-12 NOTE — Assessment & Plan Note (Signed)
Recent creatinine stable. 

## 2021-08-12 NOTE — Assessment & Plan Note (Signed)
Blood pressure is well controlled on a combination of amlodipine 5 mg daily, hydralazine 50 mg 3 times a day, HCTZ 12.5 mg daily, isosorbide 15 mg daily, losartan 100 mg daily, metoprolol tartrate 50 mg twice daily.

## 2021-08-12 NOTE — Assessment & Plan Note (Signed)
Over the past year, he has noted shortness of breath with certain activities.  This is not getting any worse.  It is not associated with chest pain or heart failure symptoms.  His wife passed away earlier this year and he has not been exercising as usual.  He does have a prior history of smoking.  I have recommended proceeding with an echocardiogram to reassess LV function as well as right heart pressures.  We will plan follow-up in 1 year.  He knows to follow-up sooner if his shortness of breath should get worse or if he develops chest discomfort.

## 2021-08-12 NOTE — Assessment & Plan Note (Addendum)
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  It has been more than 3 years since his last PCI.  He is doing well without chest discomfort to suggest angina.  He does have mild thrombocytopenia and notes easy bruising.  Therefore, I have recommended he stop taking aspirin.    -DC ASA  -Continue clopidogrel 75 mg daily.    -Continue atorvastatin 40 mg daily, isosorbide mononitrate 15 mg daily, metoprolol tartrate 50 mg twice daily.   -Follow-up in 1 year.

## 2021-08-17 ENCOUNTER — Encounter: Payer: Self-pay | Admitting: Family Medicine

## 2021-08-21 ENCOUNTER — Ambulatory Visit (INDEPENDENT_AMBULATORY_CARE_PROVIDER_SITE_OTHER): Payer: Medicare Other | Admitting: Nurse Practitioner

## 2021-08-21 ENCOUNTER — Encounter: Payer: Self-pay | Admitting: Nurse Practitioner

## 2021-08-21 VITALS — Temp 100.4°F

## 2021-08-21 DIAGNOSIS — J069 Acute upper respiratory infection, unspecified: Secondary | ICD-10-CM | POA: Diagnosis not present

## 2021-08-21 MED ORDER — AMOXICILLIN-POT CLAVULANATE 875-125 MG PO TABS
1.0000 | ORAL_TABLET | Freq: Two times a day (BID) | ORAL | 0 refills | Status: DC
Start: 2021-08-21 — End: 2021-11-25

## 2021-08-21 NOTE — Progress Notes (Signed)
   Virtual Visit  Note Due to COVID-19 pandemic this visit was conducted virtually. This visit type was conducted due to national recommendations for restrictions regarding the COVID-19 Pandemic (e.g. social distancing, sheltering in place) in an effort to limit this patient's exposure and mitigate transmission in our community. All issues noted in this document were discussed and addressed.  A physical exam was not performed with this format.  I connected with [name] Foster on 08/21/21 at 9:40 AM by telephone and verified that I am speaking with the correct person using two identifiers. [name] Foster is currently located at home during visit. The provider, Ivy Lynn, NP is located in their office at time of visit.  I discussed the limitations, risks, security and privacy concerns of performing an evaluation and management service by telephone and the availability of in person appointments. I also discussed with the patient that there may be a patient responsible charge related to this service. The patient expressed understanding and agreed to proceed.   History and Present Illness:  URI  This is a new problem. Episode onset: In the past 4 to 5 days. The problem has been gradually worsening. The maximum temperature recorded prior to his arrival was 100.4 - 100.9 F. Associated symptoms include congestion, coughing, headaches, sinus pain and a sore throat. Pertinent negatives include no rash. He has tried nothing for the symptoms.     Review of Systems  Constitutional:  Positive for chills and fever.  HENT:  Positive for congestion, sinus pain and sore throat.   Respiratory:  Positive for cough.   Skin:  Negative for rash.  Neurological:  Positive for headaches.  All other systems reviewed and are negative.   Observations/Objective: Televisit patient not in distress.  Assessment and Plan:  Upper respiratory tract infection Take meds as prescribed - Use a cool mist humidifier   -Use saline nose sprays frequently -Force fluids -For fever or aches or pains- take Tylenol or ibuprofen. -Augmentin 875-125 mg tablet by mouth daily. -At home COVID-19 test positive Follow up with worsening unresolved symptoms     Follow Up Instructions: Follow-up with worsening unresolved symptoms.    I discussed the assessment and treatment plan with the patient. The patient was provided an opportunity to ask questions and all were answered. The patient agreed with the plan and demonstrated an understanding of the instructions.   The patient was advised to call back or seek an in-person evaluation if the symptoms worsen or if the condition fails to improve as anticipated.  The above assessment and management plan was discussed with the patient. The patient verbalized understanding of and has agreed to the management plan. Patient is aware to call the clinic if symptoms persist or worsen. Patient is aware when to return to the clinic for a follow-up visit. Patient educated on when it is appropriate to go to the emergency department.   Time call ended: 8:50 AM  I provided 10 minutes of  non face-to-face time during this encounter.    Ivy Lynn, NP

## 2021-08-21 NOTE — Assessment & Plan Note (Signed)
Take meds as prescribed - Use a cool mist humidifier  -Use saline nose sprays frequently -Force fluids -For fever or aches or pains- take Tylenol or ibuprofen. -Augmentin 875-125 mg tablet by mouth daily. -At home COVID-19 test positive Follow up with worsening unresolved symptoms

## 2021-08-28 ENCOUNTER — Other Ambulatory Visit (HOSPITAL_COMMUNITY): Payer: Medicare Other

## 2021-09-04 ENCOUNTER — Other Ambulatory Visit: Payer: Self-pay | Admitting: Family Medicine

## 2021-09-04 DIAGNOSIS — R630 Anorexia: Secondary | ICD-10-CM

## 2021-09-04 DIAGNOSIS — F4321 Adjustment disorder with depressed mood: Secondary | ICD-10-CM

## 2021-09-08 ENCOUNTER — Other Ambulatory Visit: Payer: Self-pay

## 2021-09-08 ENCOUNTER — Ambulatory Visit (HOSPITAL_COMMUNITY): Payer: Medicare Other | Attending: Cardiovascular Disease

## 2021-09-08 DIAGNOSIS — I2510 Atherosclerotic heart disease of native coronary artery without angina pectoris: Secondary | ICD-10-CM | POA: Insufficient documentation

## 2021-09-08 DIAGNOSIS — R0602 Shortness of breath: Secondary | ICD-10-CM | POA: Diagnosis present

## 2021-09-08 LAB — ECHOCARDIOGRAM COMPLETE
Area-P 1/2: 3.5 cm2
S' Lateral: 2.9 cm

## 2021-09-09 ENCOUNTER — Encounter: Payer: Self-pay | Admitting: Physician Assistant

## 2021-09-21 ENCOUNTER — Other Ambulatory Visit: Payer: Self-pay | Admitting: Family Medicine

## 2021-09-21 DIAGNOSIS — E785 Hyperlipidemia, unspecified: Secondary | ICD-10-CM

## 2021-10-18 ENCOUNTER — Other Ambulatory Visit: Payer: Self-pay | Admitting: Family Medicine

## 2021-10-18 DIAGNOSIS — I10 Essential (primary) hypertension: Secondary | ICD-10-CM

## 2021-10-18 DIAGNOSIS — I2510 Atherosclerotic heart disease of native coronary artery without angina pectoris: Secondary | ICD-10-CM

## 2021-10-18 DIAGNOSIS — E1129 Type 2 diabetes mellitus with other diabetic kidney complication: Secondary | ICD-10-CM

## 2021-10-18 DIAGNOSIS — R809 Proteinuria, unspecified: Secondary | ICD-10-CM

## 2021-10-24 ENCOUNTER — Other Ambulatory Visit: Payer: Self-pay | Admitting: Family Medicine

## 2021-10-24 DIAGNOSIS — R0602 Shortness of breath: Secondary | ICD-10-CM

## 2021-11-13 ENCOUNTER — Other Ambulatory Visit: Payer: Self-pay | Admitting: Family Medicine

## 2021-11-13 DIAGNOSIS — I10 Essential (primary) hypertension: Secondary | ICD-10-CM

## 2021-11-13 DIAGNOSIS — R600 Localized edema: Secondary | ICD-10-CM

## 2021-11-24 ENCOUNTER — Other Ambulatory Visit: Payer: Self-pay | Admitting: Family Medicine

## 2021-11-24 DIAGNOSIS — I10 Essential (primary) hypertension: Secondary | ICD-10-CM

## 2021-11-24 DIAGNOSIS — I2510 Atherosclerotic heart disease of native coronary artery without angina pectoris: Secondary | ICD-10-CM

## 2021-11-25 ENCOUNTER — Other Ambulatory Visit: Payer: Self-pay

## 2021-11-25 ENCOUNTER — Encounter: Payer: Self-pay | Admitting: Gastroenterology

## 2021-11-25 ENCOUNTER — Ambulatory Visit (INDEPENDENT_AMBULATORY_CARE_PROVIDER_SITE_OTHER): Payer: Medicare Other | Admitting: Gastroenterology

## 2021-11-25 VITALS — BP 130/70 | HR 59 | Temp 97.3°F | Ht 68.0 in | Wt 193.0 lb

## 2021-11-25 DIAGNOSIS — R197 Diarrhea, unspecified: Secondary | ICD-10-CM | POA: Diagnosis not present

## 2021-11-25 DIAGNOSIS — R109 Unspecified abdominal pain: Secondary | ICD-10-CM

## 2021-11-25 DIAGNOSIS — Z86010 Personal history of colonic polyps: Secondary | ICD-10-CM | POA: Diagnosis not present

## 2021-11-25 NOTE — Progress Notes (Signed)
? ?GI Office Note   ? ?Referring Provider: [name], FNP ?Primary Care Physician:  [name], FNP ?Primary GI: Dr. Gala Romney ? ?Date:  11/25/2021  ?ID:  [name] Foster, DOB 10-Jun-1956, MRN [id number] ? ? ?Chief Complaint  ? ?Chief Complaint  ?Patient presents with  ? Colonoscopy  ? ? ? ?History of Present Illness  ?[name] Foster is a 66 y.o. male  with a history of CAD, Diabetes, HLD, HTN, Pulmonary hypertension, squamous cell carcinoma of the scalp, and  history of MI in 2018 presenting for repeat evaluation for surveillance colonoscopy. ? ?Last colonoscopy 11/22/2019 with ten 4-9 mm polyps in the rectum, sigmoid colon and cecum were removed (path -sigmoid was hyperplastic, cecal was tubular adenoma), three 12-25 mm polyps at the splenic flexure and hepatic flexure removed (path-tubular adenoma and hyperplastic), with clip placement to the largest polyp, rectal polyp was hyperplastic.  Was recommended to follow-up for repeat surveillance colonoscopy in 1 year (2022). ? ?HgbA1c 6 in November 2022, last creatinine 1.28, GFR 62. ? ?Most recent echo in December 2022 with LV EF 60 to 65% and normal right heart function. ? ?Previously evaluated in the clinic in December 2020 -at this time was complaining of abdominal bloating and was drinking 3-6 shots of liquor daily.  He had previous elevated LFTs.  He was checked for celiac which was negative, iron panel normal.  Right upper quadrant ultrasound identified probable fatty liver. ? ? ?Today:  ?Patient reports that he presents to schedule repeat colonoscopy.  He was due for this in 2022 however his wife was sick and passed in August 2022 and is now ready to repeat test.  He denies any chest pains, palpitations, reflux, melena, hematochezia, dysphagia, constipation, nausea or vomiting.  He does report some intermittent abdominal pain with bloating and diarrhea.  He reports that this happens about once a month and last for about 3 days and then goes away.  He  denies any recent diet changes, does not use much dairy products, but when he does he uses lactose-free.  He reports he is on a lot of medications and feels like this may have something to do with that, however reports that he has not had any new medications or new medications and only takes for a few days during the month.  He reports that this abdominal pain and diarrhea does not affect his daily life and that the bloating is relieved with simethicone.  He has denied any recent weight loss.  He reports that he was previously on metformin, however switched to [place] about a year ago. He reports his bowel movements are normal, he has normal formed soft stool every day without straining. ?  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  He reports that this is less than what he used to drink. ? ?He states that he has been doing well from a cardiac standpoint, last seen cardiology the end of November. ? ?He reports that he threw up some during his last prep due to significant volume.  ?  ? ? ?Past Medical History:  ?Diagnosis Date  ? Arthritis   ? CAD (coronary artery disease)   ? a. 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 DES to left PDA 09/20/17 // c. 10/19: LCx and LPDA stents ok; s/p DES  to pLAD and POBA to D1  ? Diabetes mellitus without complication ([place])   ? type 2   ? Echocardiogram 08/2021   ? Echocardiogram 12/22: EF 60-65, no RWMA, GLS -19.4% (normal), normal RVSF, trivial MR, mild dilation of ascending aorta (37 mm)  ? Heart attack ([place])   ? " a blood clot is actually what caused my heart attack in 2018 " , he states he is unsure where the blood clot originated from   ? Hyperlipidemia   ? Hypertension   ? Insomnia   ? Pulmonary hypertension ([place])   ? a. TTE 03/25/17; EF 60-65%, nl WM, LV diastolic fxn nl, trivial AI, RV cavity size, wall thickness, and  sys fxn nl, trivial TR, PASP 39 mmHg, trivial pericardial effusion // Echo 12/22: normal RVSP, PASP  ? 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 Mid Parietal Scalp (well diff)  ? Sleep apnea   ? could not use a cpap  ? Vitamin B12 deficiency 05/03/2020  ? Wears dentures   ? full top-partial bottom  ? Wears glasses   ? ? ?Past Surgical History:  ?Procedure Laterality Date  ? COLONOSCOPY  05/13/2010  ? Single anal papilla, 2 diminutive polyps at 10 cm, left-sided diverticula, one sigmoid and one a sending polyp, otherwise normal colon and TI.  Path with 1 tubular adenoma, otherwise hyperplastic polyps.  Recommendations repeat colonoscopy in 7 years.  ? COLONOSCOPY WITH PROPOFOL N/A 11/22/2019  ? Procedure: COLONOSCOPY WITH PROPOFOL;  Surgeon: Daneil Dolin, MD;  [facility];  Service: Endoscopy;  Laterality: N/A;  9:15am  ? CORONARY ATHERECTOMY N/A 07/03/2018  ? Procedure: CORONARY ATHERECTOMY;  Surgeon: Burnell Blanks, MD;  Location: [place] [facility];  Service: Cardiovascular;  Laterality: N/A;  ? CORONARY STENT INTERVENTION N/A 09/20/2017  ? Procedure: CORONARY STENT INTERVENTION;  Surgeon: Leonie Man, MD;  Location: [place] [facility];  Service: Cardiovascular;  Laterality: N/A;  ? CORONARY STENT INTERVENTION N/A 07/03/2018  ? Procedure: CORONARY STENT INTERVENTION;  Surgeon: Burnell Blanks, MD;  Location: [place] [facility];  Service: Cardiovascular;  Laterality: N/A;  ? CORONARY/GRAFT ACUTE MI REVASCULARIZATION N/A 03/25/2017  ? Procedure: Coronary/Graft Acute MI Revascularization;  Surgeon: Sherren Mocha, MD;  Location: [place] [facility];  Service: Cardiovascular;  Laterality: N/A;  ? GANGLION CYST EXCISION Left 07/25/2014  ? Procedure: EXCISION VOLAR AND DORSAL GANGLION LEFT WRIST;  Surgeon: Leanora Cover, MD;  Location: [place];  Service: Orthopedics;  Laterality: Left;  ? INTRAVASCULAR PRESSURE WIRE/FFR STUDY N/A 09/20/2017  ? Procedure:  INTRAVASCULAR PRESSURE WIRE/FFR STUDY;  Surgeon: Leonie Man, MD;  Location: [place] [facility];  Service: Cardiovascular;  Laterality: N/A;  ? INTRAVASCULAR PRESSURE WIRE/FFR STUDY N/A 06/30/2018  ? Procedure: INTRAVASCULAR PRESSURE WIRE/FFR STUDY;  Surgeon: Nelva Bush, MD;  Location: [place] [facility];  Service: Cardiovascular;  Laterality: N/A;  ? KNEE ARTHROSCOPY    ? rifgr and left  ? LEFT HEART CATH AND CORONARY ANGIOGRAPHY N/A 03/25/2017  ? Procedure: Left Heart Cath and Coronary Angiography;  Surgeon: Sherren Mocha, MD;  Location: [place] [facility];  Service: Cardiovascular;  Laterality: N/A;  ? LEFT HEART CATH AND CORONARY ANGIOGRAPHY N/A 09/20/2017  ? Procedure: LEFT HEART CATH AND CORONARY ANGIOGRAPHY;  Surgeon: Leonie Man, MD;  Location: [place] [facility];  Service: Cardiovascular;  Laterality: N/A;  ? LEFT HEART CATH AND CORONARY ANGIOGRAPHY N/A 06/30/2018  ? Procedure: LEFT HEART CATH AND CORONARY  ANGIOGRAPHY;  Surgeon: Nelva Bush, MD;  Location: [place] [facility];  Service: Cardiovascular;  Laterality: N/A;  ? POLYPECTOMY  11/22/2019  ? Procedure: POLYPECTOMY;  Surgeon: Daneil Dolin, MD;  [facility];  Service: Endoscopy;;  ? SHOULDER ARTHROSCOPY  2012  ? left  ? SHOULDER ARTHROSCOPY    ? right  ? TEMPORARY PACEMAKER N/A 03/25/2017  ? Procedure: Temporary Pacemaker;  Surgeon: Sherren Mocha, MD;  Location: [place] [facility];  Service: Cardiovascular;  Laterality: N/A;  ? TONSILLECTOMY    ? TOTAL HIP ARTHROPLASTY Right 04/25/2019  ? TOTAL HIP ARTHROPLASTY Right 04/25/2019  ? Procedure: TOTAL HIP ARTHROPLASTY ANTERIOR APPROACH;  Surgeon: Rod Can, MD;  Location: [facility];  Service: Orthopedics;  Laterality: Right;  ? TOTAL KNEE ARTHROPLASTY  2002  ? left  ? ? ?Current Outpatient Medications  ?Medication Sig Dispense Refill  ? albuterol (VENTOLIN HFA) 108 (90 Base) MCG/ACT inhaler TAKE 2 PUFFS BY MOUTH EVERY 6 HOURS AS NEEDED FOR WHEEZE OR SHORTNESS OF BREATH  8.5 each 1  ? amLODipine (NORVASC) 5 MG tablet Take 1 tablet (5 mg total) by mouth daily. 90 tablet 1  ? atorvastatin (LIPITOR) 40 MG tablet TAKE 1 TABLET BY MOUTH DAILY AT 6 PM. 90 tablet 0  ? clopidogrel (PLAV

## 2021-11-25 NOTE — Progress Notes (Deleted)
Wife passed in august. ? ?Was due to repeat in 2022.  ? ? ?Go through periods of abdominal pain and extreme diarrhea, associates bloating as well. Has them about a few days every month then goes away. Just startes. Denies any constipation - feels like it may be medication as he takes a lot.  Not back to normal. Normal consistency - formed and soft and easy to pass. Does not appreciate any diet changes, doesn't use much dairy products - but when he does he uses lactose free. Denies N/V. Does not affect his daily life. Denies melena or hemachezia. Denies dysphagia. Simethicone helps with bloating.  ? ?Denies chest pains or heart palpitations. Denise reflux.  ? ? ?Stopped metformin - about a year ago.  ? ?Still on plavix - since MI, Cardioloigist took him off at last appointment. ? ?Swelling controlled - msotly feet swelling.  ? ?Jardiance AM ? ?A1c 6 in November 2022 ? ?Last AST 44, ALT 33 ? ?EF 60-65% ? ?Will talk to dr Gala Romney about plavix ? ?Last time threw up prior prep trying to get it down due to the volume.  ? ?

## 2021-11-25 NOTE — Patient Instructions (Signed)
As discussed we are scheduling you for colonoscopy with Dr. Gala Romney in the near future. ? ?You need to hold the Jardiance morning procedure and he may continue to take Plavix.  Instructions will be mailed to you. ? ?It was a pleasure to meet you today. I want to create trusting relationships with patients. If you receive a survey regarding your visit,  I greatly appreciate you taking time to fill this out on paper or through your MyChart. I value your feedback. ? ?Venetia Night, MSN, FNP-BC, AGACNP-BC ?[facility] ?  ? ?

## 2021-12-01 ENCOUNTER — Other Ambulatory Visit: Payer: Self-pay | Admitting: Family Medicine

## 2021-12-01 DIAGNOSIS — F4321 Adjustment disorder with depressed mood: Secondary | ICD-10-CM

## 2021-12-01 DIAGNOSIS — R630 Anorexia: Secondary | ICD-10-CM

## 2021-12-02 ENCOUNTER — Telehealth: Payer: Self-pay | Admitting: Internal Medicine

## 2021-12-02 NOTE — Telephone Encounter (Signed)
We are currently awaiting Dr. Gala Romney May schedule as pt needs a morning procedure. ? ?Called pt and made aware.  ?

## 2021-12-02 NOTE — Telephone Encounter (Signed)
Pt calling to schedule his procedure. [phone] ?

## 2021-12-10 ENCOUNTER — Ambulatory Visit (INDEPENDENT_AMBULATORY_CARE_PROVIDER_SITE_OTHER): Payer: Medicare Other | Admitting: Family Medicine

## 2021-12-10 ENCOUNTER — Encounter: Payer: Self-pay | Admitting: Family Medicine

## 2021-12-10 VITALS — BP 136/74 | HR 63 | Temp 98.4°F | Ht 68.0 in | Wt 192.6 lb

## 2021-12-10 DIAGNOSIS — N1831 Chronic kidney disease, stage 3a: Secondary | ICD-10-CM

## 2021-12-10 DIAGNOSIS — I10 Essential (primary) hypertension: Secondary | ICD-10-CM | POA: Diagnosis not present

## 2021-12-10 DIAGNOSIS — Z955 Presence of coronary angioplasty implant and graft: Secondary | ICD-10-CM

## 2021-12-10 DIAGNOSIS — E785 Hyperlipidemia, unspecified: Secondary | ICD-10-CM

## 2021-12-10 DIAGNOSIS — I2510 Atherosclerotic heart disease of native coronary artery without angina pectoris: Secondary | ICD-10-CM

## 2021-12-10 DIAGNOSIS — E1129 Type 2 diabetes mellitus with other diabetic kidney complication: Secondary | ICD-10-CM

## 2021-12-10 DIAGNOSIS — R809 Proteinuria, unspecified: Secondary | ICD-10-CM

## 2021-12-10 DIAGNOSIS — G4733 Obstructive sleep apnea (adult) (pediatric): Secondary | ICD-10-CM

## 2021-12-10 DIAGNOSIS — I252 Old myocardial infarction: Secondary | ICD-10-CM

## 2021-12-10 DIAGNOSIS — Z596 Low income: Secondary | ICD-10-CM

## 2021-12-10 LAB — BAYER DCA HB A1C WAIVED: HB A1C (BAYER DCA - WAIVED): 5.6 % (ref 4.8–5.6)

## 2021-12-10 MED ORDER — METOPROLOL TARTRATE 50 MG PO TABS: 50.0000 mg | ORAL_TABLET | Freq: Two times a day (BID) | ORAL | 1 refills | Status: DC

## 2021-12-10 MED ORDER — AMLODIPINE BESYLATE 5 MG PO TABS: 5.0000 mg | ORAL_TABLET | Freq: Every day | ORAL | 1 refills | Status: DC

## 2021-12-10 MED ORDER — CLOPIDOGREL BISULFATE 75 MG PO TABS: 75.0000 mg | ORAL_TABLET | Freq: Every day | ORAL | 1 refills | Status: DC

## 2021-12-10 MED ORDER — HYDRALAZINE HCL 50 MG PO TABS: 50.0000 mg | ORAL_TABLET | Freq: Three times a day (TID) | ORAL | 1 refills | Status: DC

## 2021-12-10 NOTE — Progress Notes (Signed)
? ?Assessment & Plan:  ?1. Type 2 diabetes mellitus with microalbuminuria, without long-term current use of insulin ([place]) ?Lab Results  ?Component Value Date  ? HGBA1C 5.6 12/10/2021  ? HGBA1C 6.0 (H) 08/11/2021  ? HGBA1C 5.6 02/03/2021  ?  ?- Diabetes is at goal of A1c < 7. ?- Medications: continue current medications ?- Home glucose monitoring: continue monitoring ?- Patient is currently taking a statin. Patient is taking an ACE-inhibitor/ARB.  ?- Eye exam requested from [facility] the second time ? ?Diabetes Health Maintenance Due  ?Topic Date Due  ? OPHTHALMOLOGY EXAM  11/20/2019  ? FOOT EXAM  05/06/2022  ? HEMOGLOBIN A1C  06/12/2022  ?  ?Lab Results  ?Component Value Date  ? LABMICR 909.3 12/10/2021  ? LABMICR 630.7 07/19/2019  ? ?- Lipid panel ?- CBC with Differential/Platelet ?- CMP14+EGFR ?- Bayer DCA Hb A1c Waived ?- Microalbumin / creatinine urine ratio ? ?2. Essential hypertension ?Well controlled on current regimen.  ?- Lipid panel ?- CBC with Differential/Platelet ?- CMP14+EGFR ?- amLODipine (NORVASC) 5 MG tablet; Take 1 tablet (5 mg total) by mouth daily.  Dispense: 90 tablet; Refill: 1 ?- hydrALAZINE (APRESOLINE) 50 MG tablet; Take 1 tablet (50 mg total) by mouth every 8 (eight) hours.  Dispense: 270 tablet; Refill: 1 ?- metoprolol tartrate (LOPRESSOR) 50 MG tablet; Take 1 tablet (50 mg total) by mouth 2 (two) times daily.  Dispense: 180 tablet; Refill: 1 ? ?3. Coronary artery disease involving native coronary artery of native heart without angina pectoris ?Continue statin and Plavix.  ?- Lipid panel ?- CMP14+EGFR ?- clopidogrel (PLAVIX) 75 MG tablet; Take 1 tablet (75 mg total) by mouth daily.  Dispense: 90 tablet; Refill: 1 ?- metoprolol tartrate (LOPRESSOR) 50 MG tablet; Take 1 tablet (50 mg total) by mouth 2 (two) times daily.  Dispense: 180 tablet; Refill: 1 ? ?4. Dyslipidemia, goal LDL below 70 ?Well controlled on current regimen.  ?- Lipid panel ?- CMP14+EGFR ? ?5. History of ST  elevation myocardial infarction (STEMI) ?Continue statin and Plavix. ?- Lipid panel ?- CMP14+EGFR ?- clopidogrel (PLAVIX) 75 MG tablet; Take 1 tablet (75 mg total) by mouth daily.  Dispense: 90 tablet; Refill: 1 ? ?6. Presence of drug-eluting stent in left circumflex coronary artery ?Continue statin and Plavix. ?- Lipid panel ?- CMP14+EGFR ?- clopidogrel (PLAVIX) 75 MG tablet; Take 1 tablet (75 mg total) by mouth daily.  Dispense: 90 tablet; Refill: 1 ? ?7. Stage 3a chronic kidney disease ([place]) ?Continue Jardiance. ?- CMP14+EGFR ? ?8. Patient cannot afford medications ?Needs assistance with Jardiance. ?- [facility]B Referral to [facility] ? ?9. Obstructive sleep apnea syndrome ?Intolerant of CPAP. ? ? ?Return in about 6 months (around 06/12/2022) for annual physical. ? ?[name] Limes, MSN, APRN, FNP-C ?[name] ? ?Subjective:  ? ? Patient ID: [name] Foster, male    DOB: [birth year], 66 y.o.   MRN: [id number] ? ?Patient Care Team: ?[name], FNP as PCP - General (Family Medicine) ?Sherren Mocha, MD as PCP - Cardiology (Cardiology) ?[facility] as Set designer (Orthopedic Surgery) ?Lavonna Monarch, MD as Consulting Physician (Dermatology) ?Sharmon Revere as Physician Assistant (Cardiology) ?Harlen Labs, MD as Referring Physician (Optometry)  ? ?Chief Complaint:  ?Chief Complaint  ?Patient presents with  ? Medical Management of Chronic Issues  ? ? ?HPI: ?[name] Foster is a 66 y.o. male presenting on 12/10/2021 for Medical Management of Chronic Issues ? ?Diabetes: Patient presents for follow up of diabetes. Current  symptoms include: none. Known diabetic complications: none. Medication compliance: yes. Current diet:  eating out more often and therefore probably less healthy than previously . Current exercise: none. Home blood sugar records:  patient reports the highest he has seen is 130; he is not having any lows . Is he  on ACE inhibitor or angiotensin II  receptor blocker? Yes (Losartan). Is he on a statin? Yes (Atorvastatin).  ? ?Hypertension: patient states he does check his blood pressure at home and it is always good. He is not exercising.  ? ?CAD/Dyslipidemia/History of STEMI/Presence of stent: taking Atorvastatin and aspirin daily.  ? ?CKD: stage 3. Taking Jardiance 25 mg daily. Patient reports the Jardiance costs him $560 for a three month supply.  ? ?Sleep apnea: intolerant of CPAP. ? ?New complaints: ?None ? ? ?Social history: ? ?Relevant past medical, surgical, family and social history reviewed and updated as indicated. Interim medical history since our last visit reviewed. ? ?Allergies and medications reviewed and updated. ? ?DATA REVIEWED: CHART IN EPIC ? ?ROS: Negative unless specifically indicated above in HPI.  ? ? ?Current Outpatient Medications:  ?  albuterol (VENTOLIN HFA) 108 (90 Base) MCG/ACT inhaler, TAKE 2 PUFFS BY MOUTH EVERY 6 HOURS AS NEEDED FOR WHEEZE OR SHORTNESS OF BREATH, Disp: 8.5 each, Rfl: 1 ?  amLODipine (NORVASC) 5 MG tablet, Take 1 tablet (5 mg total) by mouth daily., Disp: 90 tablet, Rfl: 1 ?  atorvastatin (LIPITOR) 40 MG tablet, TAKE 1 TABLET BY MOUTH DAILY AT 6 PM., Disp: 90 tablet, Rfl: 0 ?  clopidogrel (PLAVIX) 75 MG tablet, Take 1 tablet (75 mg total) by mouth daily., Disp: 90 tablet, Rfl: 1 ?  Coenzyme Q10 (COQ-10) 100 MG CAPS, Take 100 mg by mouth daily., Disp: , Rfl:  ?  hydrALAZINE (APRESOLINE) 50 MG tablet, Take 1 tablet (50 mg total) by mouth every 8 (eight) hours., Disp: 270 tablet, Rfl: 1 ?  hydrochlorothiazide (HYDRODIURIL) 12.5 MG tablet, Take 1 tablet (12.5 mg total) by mouth daily. (NEEDS TO BE SEEN BEFORE NEXT REFILL), Disp: 90 tablet, Rfl: 0 ?  isosorbide mononitrate (IMDUR) 30 MG 24 hr tablet, TAKE 1/2 OF A TABLET (15 MG TOTAL) BY MOUTH DAILY, Disp: 45 tablet, Rfl: 0 ?  JARDIANCE 25 MG TABS tablet, TAKE 1 TABLET BY MOUTH EVERY DAY BEFORE BREAKFAST, Disp: 90 tablet, Rfl: 0 ?  losartan (COZAAR) 100 MG tablet,  TAKE 1 TABLET BY MOUTH EVERY DAY, Disp: 90 tablet, Rfl: 0 ?  metoprolol tartrate (LOPRESSOR) 50 MG tablet, Take 1 tablet (50 mg total) by mouth 2 (two) times daily., Disp: 180 tablet, Rfl: 1 ?  mirtazapine (REMERON) 7.5 MG tablet, TAKE 1 TABLET BY MOUTH EVERYDAY AT BEDTIME, Disp: 90 tablet, Rfl: 0 ?  nitroGLYCERIN (NITROSTAT) 0.4 MG SL tablet, DISSOLVE 1 TAB UNDER TONGUE AT ONSET OF CHEST PAIN. REPEAT EVERY 5 MINUTES FOR 2 DOSES AS NEEDED, Disp: 25 tablet, Rfl: 2 ?  RESTASIS 0.05 % ophthalmic emulsion, 1 drop daily., Disp: , Rfl:  ?  Simethicone (GAS-X PO), Take 1 tablet by mouth daily as needed (gas)., Disp: , Rfl:   ? ?No Known Allergies ?Past Medical History:  ?Diagnosis Date  ? Arthritis   ? CAD (coronary artery disease)   ? a. 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 DES to left PDA 09/20/17 // c. 10/19: LCx and LPDA stents ok;  s/p DES to pLAD and POBA to D1  ? Diabetes mellitus without complication ([place])   ? type 2   ? Echocardiogram 08/2021   ? Echocardiogram 12/22: EF 60-65, no RWMA, GLS -19.4% (normal), normal RVSF, trivial MR, mild dilation of ascending aorta (37 mm)  ? Heart attack ([place])   ? " a blood clot is actually what caused my heart attack in 2018 " , he states he is unsure where the blood clot originated from   ? Hyperlipidemia   ? Hypertension   ? Insomnia   ? Pulmonary hypertension ([place])   ? a. TTE 03/25/17; EF 60-65%, nl WM, LV diastolic fxn nl, trivial AI, RV cavity size, wall thickness, and sys fxn nl, trivial TR, PASP 39 mmHg, trivial pericardial effusion // Echo 12/22: normal RVSP, PASP  ? 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 Mid Parietal Scalp (well diff)  ? Sleep apnea   ? could not use a cpap  ? Vitamin B12 deficiency 05/03/2020  ? Wears dentures   ? full top-partial bottom  ? Wears glasses   ?  ?Past Surgical History:  ?Procedure Laterality Date  ?  COLONOSCOPY  05/13/2010  ? Single anal papilla, 2 diminutive polyps at 10 cm, left-sided diverticula, one sigmoid and one a sending polyp, otherwise normal colon and TI.  Path with 1 tubular adenoma, otherw

## 2021-12-11 LAB — CBC WITH DIFFERENTIAL/PLATELET
Basophils Absolute: 0 10*3/uL (ref 0.0–0.2)
Basos: 1 %
EOS (ABSOLUTE): 0.1 10*3/uL (ref 0.0–0.4)
Eos: 3 %
Hematocrit: 39.4 % (ref 37.5–51.0)
Hemoglobin: 13.7 g/dL (ref 13.0–17.7)
Immature Grans (Abs): 0 10*3/uL (ref 0.0–0.1)
Immature Granulocytes: 1 %
Lymphocytes Absolute: 0.9 10*3/uL (ref 0.7–3.1)
Lymphs: 22 %
MCH: 30.9 pg (ref 26.6–33.0)
MCHC: 34.8 g/dL (ref 31.5–35.7)
MCV: 89 fL (ref 79–97)
Monocytes Absolute: 0.4 10*3/uL (ref 0.1–0.9)
Monocytes: 9 %
Neutrophils Absolute: 2.6 10*3/uL (ref 1.4–7.0)
Neutrophils: 64 %
Platelets: 94 10*3/uL — CL (ref 150–450)
RBC: 4.43 x10E6/uL (ref 4.14–5.80)
RDW: 14.4 % (ref 11.6–15.4)
WBC: 4 10*3/uL (ref 3.4–10.8)

## 2021-12-11 LAB — CMP14+EGFR
ALT: 65 IU/L — ABNORMAL HIGH (ref 0–44)
AST: 132 IU/L — ABNORMAL HIGH (ref 0–40)
Albumin/Globulin Ratio: 1.5 (ref 1.2–2.2)
Albumin: 3.8 g/dL (ref 3.8–4.8)
Alkaline Phosphatase: 76 IU/L (ref 44–121)
BUN/Creatinine Ratio: 14 (ref 10–24)
BUN: 17 mg/dL (ref 8–27)
Bilirubin Total: 0.5 mg/dL (ref 0.0–1.2)
CO2: 19 mmol/L — ABNORMAL LOW (ref 20–29)
Calcium: 8.8 mg/dL (ref 8.6–10.2)
Chloride: 105 mmol/L (ref 96–106)
Creatinine, Ser: 1.23 mg/dL (ref 0.76–1.27)
Globulin, Total: 2.5 g/dL (ref 1.5–4.5)
Glucose: 120 mg/dL — ABNORMAL HIGH (ref 70–99)
Potassium: 4.2 mmol/L (ref 3.5–5.2)
Sodium: 143 mmol/L (ref 134–144)
Total Protein: 6.3 g/dL (ref 6.0–8.5)
eGFR: 65 mL/min/{1.73_m2} (ref >59)

## 2021-12-11 LAB — MICROALBUMIN / CREATININE URINE RATIO
Creatinine, Urine: 145.9 mg/dL
Microalb/Creat Ratio: 623 mg/g creat — ABNORMAL HIGH (ref 0–29)
Microalbumin, Urine: 909.3 ug/mL

## 2021-12-11 LAB — LIPID PANEL
Chol/HDL Ratio: 1.8 ratio (ref 0.0–5.0)
Cholesterol, Total: 135 mg/dL (ref 100–199)
HDL: 77 mg/dL (ref >39)
LDL Chol Calc (NIH): 43 mg/dL (ref 0–99)
Triglycerides: 79 mg/dL (ref 0–149)
VLDL Cholesterol Cal: 15 mg/dL (ref 5–40)

## 2021-12-13 ENCOUNTER — Other Ambulatory Visit: Payer: Self-pay | Admitting: Family Medicine

## 2021-12-13 DIAGNOSIS — D696 Thrombocytopenia, unspecified: Secondary | ICD-10-CM | POA: Insufficient documentation

## 2021-12-14 ENCOUNTER — Telehealth: Payer: Self-pay | Admitting: Family Medicine

## 2021-12-14 ENCOUNTER — Encounter: Payer: Self-pay | Admitting: Family Medicine

## 2021-12-14 DIAGNOSIS — N1831 Chronic kidney disease, stage 3a: Secondary | ICD-10-CM

## 2021-12-14 DIAGNOSIS — E1121 Type 2 diabetes mellitus with diabetic nephropathy: Secondary | ICD-10-CM | POA: Insufficient documentation

## 2021-12-14 NOTE — Telephone Encounter (Signed)
Pt aware of results and also referral placed for nephrology per [facility] result notes. ?

## 2021-12-16 ENCOUNTER — Telehealth: Payer: Self-pay | Admitting: *Deleted

## 2021-12-16 ENCOUNTER — Telehealth: Payer: Self-pay

## 2021-12-16 ENCOUNTER — Encounter: Payer: Self-pay | Admitting: *Deleted

## 2021-12-16 MED ORDER — PEG 3350-KCL-NA BICARB-NACL 420 G PO SOLR: ORAL | 0 refills | Status: DC

## 2021-12-16 NOTE — Telephone Encounter (Signed)
Spoke with pt. He has been scheduled for TCS with propofol asa 3 Dr. Gala Romney on 5/4 at 11:15am. Aware will mail prep instructions/pre-op appt. Rx sent to pharmacy. ? ? ?PA done via [facility] website. Auth# [id number], DOS: Jan 14, 2022 - Apr 14, 2022 ?

## 2021-12-16 NOTE — Chronic Care Management (AMB) (Signed)
?  Care Management  ? ?Note ? ?12/16/2021 ?Name: [name] Foster MRN: [id number] DOB: Apr 24, 1956 ? ?[name] Foster is a 66 y.o. year old male who is a primary care patient of [name] Brooklyn, [name] Foster. I reached out to [name] Foster by phone today offer care coordination services.  ? ?[name] Foster was given information about care management services today including:  ?Care management services include personalized support from designated clinical staff supervised by his physician, including individualized plan of care and coordination with other care providers ?24/7 contact phone numbers for assistance for urgent and routine care needs. ?The patient may stop care management services at any time by phone call to the office staff. ? ?Patient agreed to services and verbal consent obtained.  ? ?Follow up plan: ?Telephone appointment with care management team member scheduled for:01/08/2022 ? ?[name] Foster, [name] Foster ?Care Guide, Embedded Care Coordination ?[facility]  ?[place], [place] [address] ?Direct Dial: [phone] ?Museum/gallery [email or web address] ?Website: [email or web address]  ? ?

## 2021-12-17 ENCOUNTER — Other Ambulatory Visit: Payer: Self-pay | Admitting: Family Medicine

## 2021-12-17 DIAGNOSIS — E785 Hyperlipidemia, unspecified: Secondary | ICD-10-CM

## 2021-12-23 ENCOUNTER — Other Ambulatory Visit: Payer: Self-pay | Admitting: Family Medicine

## 2021-12-23 DIAGNOSIS — R0602 Shortness of breath: Secondary | ICD-10-CM

## 2022-01-08 ENCOUNTER — Ambulatory Visit (INDEPENDENT_AMBULATORY_CARE_PROVIDER_SITE_OTHER): Payer: Medicare Other | Admitting: Pharmacist

## 2022-01-08 DIAGNOSIS — E1129 Type 2 diabetes mellitus with other diabetic kidney complication: Secondary | ICD-10-CM

## 2022-01-08 DIAGNOSIS — I2510 Atherosclerotic heart disease of native coronary artery without angina pectoris: Secondary | ICD-10-CM

## 2022-01-08 MED ORDER — DAPAGLIFLOZIN PROPANEDIOL 10 MG PO TABS: 10.0000 mg | ORAL_TABLET | Freq: Every day | ORAL | 5 refills | Status: DC

## 2022-01-08 NOTE — Progress Notes (Signed)
? ? ?Chronic Care Management ?Pharmacy Note ? ?01/08/2022 ?Name:  [name] Foster MRN:  [id number] DOB:  03-11-1956 ? ?Summary: ? ?Diabetes: New goal. ?Controlled-a1c 5.6%; current treatment: JARDIANCE-->FARXIGA 10MG DAILY;  ?Farxiga benefits-->CKD, CAD given patient's cardiac history and diabetes ?Will transition patient to [place] given a hardcopy of finances is unavailable ?Az&me patient assistance doesn't require hardcopy, therefore we will transition patient to farxiga 79m ?Samples given. Med list updated ?Refills escribed to [facility] in Epic per [place] patient assistance program rules ?Current glucose readings: fasting glucose: <130, post prandial glucose: <160 ?Denies hypoglycemic/hyperglycemic symptoms ?Current exercise: encouraged ?Assessed patient finances. Enrolled in az&me patient assistance program; escribed refills to [facility] in Epic #90 supplies with refills until 2024; will ship every 3 months to patient's home ? ?Subjective: ?[name] Foster an 66y.o. year old male who is a primary patient of JLoman Brooklyn FNP.  The CCM team was consulted for assistance with disease management and care coordination needs.   ? ?Engaged with patient face to face for initial visit in response to provider referral for pharmacy case management and/or care coordination services.  ? ?Consent to Services:  ?The patient was given information about Chronic Care Management services, agreed to services, and gave verbal consent prior to initiation of services.  Please see initial visit note for detailed documentation.  ? ?Patient Care Team: ?JLoman Brooklyn FNP as PCP - General (Family Medicine) ?CSherren Mocha MD as PCP - Cardiology (Cardiology) ?[facility] as CSet designer(Orthopedic Surgery) ?TLavonna Monarch MD as Consulting Physician (Dermatology) ?WSharmon Revereas Physician Assistant (Cardiology) ?LHarlen Labs MD as Referring Physician (Optometry) ?PLavera Guise RCongersas [place] HMuseum/gallery curator(Pharmacist) ? ?Objective: ? ?Lab Results  ?Component Value Date  ? CREATININE 1.23 12/10/2021  ? CREATININE 1.28 (H) 08/11/2021  ? CREATININE 1.52 (H) 05/06/2021  ? ? ?Lab Results  ?Component Value Date  ? HGBA1C 5.6 12/10/2021  ? ?Last diabetic Eye exam:  ?Lab Results  ?Component Value Date/Time  ? HMDIABEYEEXA No Retinopathy 11/20/2018 12:00 AM  ?  ?Last diabetic Foot exam: No results found for: HMDIABFOOTEX  ? ?   ?Component Value Date/Time  ? CHOL 135 12/10/2021 1331  ? TRIG 79 12/10/2021 1331  ? HDL 77 12/10/2021 1331  ? CHOLHDL 1.8 12/10/2021 1331  ? CHOLHDL 4.4 07/01/2018 0334  ? VLDL 36 07/01/2018 0334  ? LDLCALC 43 12/10/2021 1331  ? ? ? ?  Latest Ref Rng & Units 12/10/2021  ?  1:31 PM 08/11/2021  ?  1:40 PM 05/06/2021  ? 12:47 PM  ?Hepatic Function  ?Total Protein 6.0 - 8.5 g/dL 6.3   6.4   5.9    ?Albumin 3.8 - 4.8 g/dL 3.8   4.1   3.9    ?AST 0 - 40 IU/L 132   44   150    ?ALT 0 - 44 IU/L 65   33   67    ?Alk Phosphatase 44 - 121 IU/L 76   68   68    ?Total Bilirubin 0.0 - 1.2 mg/dL 0.5   0.4   0.4    ? ? ?No results found for: TSH, FREET4 ? ? ?  Latest Ref Rng & Units 12/10/2021  ?  1:31 PM 08/11/2021  ?  1:40 PM 02/03/2021  ?  9:43 AM  ?CBC  ?WBC 3.4 - 10.8 x10E3/uL 4.0   5.9   4.0    ?Hemoglobin 13.0 - 17.7  g/dL 13.7   13.2   13.7    ?Hematocrit 37.5 - 51.0 % 39.4   40.5   40.1    ?Platelets 150 - 450 x10E3/uL 94   102   112    ? ? ?No results found for: VD25OH ? ?Clinical ASCVD: Yes  ?The ASCVD Risk score (Arnett DK, et al., 2019) failed to calculate for the following reasons: ?  The patient has a prior MI or stroke diagnosis   ? ?Other: (CHADS2VASc if Afib, PHQ9 if depression, MMRC or CAT for COPD, ACT, DEXA) ? ?Social History  ? ?Tobacco Use  ?Smoking Status Former  ? Packs/day: 2.00  ? Years: 30.00  ? Pack years: 60.00  ? Types: Cigarettes  ? Quit date: 03/25/2017  ? Years since quitting: 4.8  ?Smokeless Tobacco Never  ? ?BP Readings from Last 3 Encounters:  ?12/10/21 136/74   ?11/25/21 130/70  ?08/12/21 130/60  ? ?Pulse Readings from Last 3 Encounters:  ?12/10/21 63  ?11/25/21 (!) 59  ?08/12/21 74  ? ?Wt Readings from Last 3 Encounters:  ?12/10/21 192 lb 9.6 oz (87.4 kg)  ?11/25/21 193 lb (87.5 kg)  ?08/12/21 201 lb 9.6 oz (91.4 kg)  ? ? ?Assessment: Review of patient past medical history, allergies, medications, health status, including review of consultants reports, laboratory and other test data, was performed as part of comprehensive evaluation and provision of chronic care management services.  ? ?SDOH:  (Social Determinants of Health) assessments and interventions performed:  ? ? ?CCM Care Plan ? ?No Known Allergies ? ?Medications Reviewed Today   ? ? Reviewed by Lavera Guise, [facility] (Pharmacist) on 01/10/22 at [place] List Status: <None>  ? ?Medication Order Taking? Sig Documenting Provider Last Dose Status Informant  ?albuterol (VENTOLIN HFA) 108 (90 Base) MCG/ACT inhaler [id number]  TAKE 2 PUFFS BY MOUTH EVERY 6 HOURS AS NEEDED FOR WHEEZE OR SHORTNESS OF Estil Daft F, FNP  Active Self  ?amLODipine (NORVASC) 5 MG tablet [id number]  Take 1 tablet (5 mg total) by mouth daily. [name] F, FNP  Active Self  ?atorvastatin (LIPITOR) 40 MG tablet [id number]  TAKE 1 TABLET BY MOUTH DAILY AT 6 PM. [name] F, FNP  Active Self  ?clopidogrel (PLAVIX) 75 MG tablet [id number]  Take 1 tablet (75 mg total) by mouth daily. [name], FNP  Active Self  ?Coenzyme Q10 (COQ-10) 100 MG CAPS [id number] No Take 100 mg by mouth daily. [provider] Taking Active Self  ?dapagliflozin propanediol (FARXIGA) 10 MG TABS tablet [id number] Yes Take 1 tablet (10 mg total) by mouth daily before breakfast. [name], FNP  Active   ?         ?Med Note (Johnni Wunschel D   Sun Jan 10, 2022 12:28 AM) .az  ?hydrALAZINE (APRESOLINE) 50 MG tablet [id number]  Take 1 tablet (50 mg total) by mouth every 8 (eight) hours.  ?Patient taking differently: Take 50 mg by mouth in the  morning and at bedtime.  ? [name], FNP  Active Self  ?hydrochlorothiazide (HYDRODIURIL) 12.5 MG tablet [id number] No Take 1 tablet (12.5 mg total) by mouth daily. (NEEDS TO BE SEEN BEFORE NEXT REFILL) [name], FNP Taking Active Self  ?isosorbide mononitrate (IMDUR) 30 MG 24 hr tablet [id number] No TAKE 1/2 OF A TABLET (15 MG TOTAL) BY MOUTH DAILY [name], FNP Taking Active Self  ?Discontinued 16/60/63 0160 (Duplicate) losartan (COZAAR) 100 MG tablet [id number] No TAKE 1  TABLET BY MOUTH EVERY DAY [name], FNP Taking Active Self  ?metoprolol tartrate (LOPRESSOR) 50 MG tablet [id number]  Take 1 tablet (50 mg total) by mouth 2 (two) times daily. [name] F, FNP  Active Self  ?mirtazapine (REMERON) 7.5 MG tablet [id number] No TAKE 1 TABLET BY MOUTH EVERYDAY AT BEDTIME [name] F, FNP Taking Active Self  ?nitroGLYCERIN (NITROSTAT) 0.4 MG SL tablet [id number] No DISSOLVE 1 TAB UNDER TONGUE AT ONSET OF CHEST PAIN. REPEAT EVERY 5 MINUTES FOR 2 DOSES AS NEEDED [name], FNP Taking Active Self  ?polyethylene glycol-electrolytes (NULYTELY) 420 g solution [id number]  As directed Rourk, Cristopher Estimable, MD  Active Self  ?prednisoLONE acetate (PRED FORTE) 1 % ophthalmic suspension [id number]  Place 1 drop into both eyes daily. [provider]  Active Self  ?RESTASIS 0.05 % ophthalmic emulsion [id number] No Place 1 drop into both eyes daily. [provider] Taking Active Self  ?Simethicone (GAS-X PO) [id number] No Take 1 tablet by mouth daily. [provider] Taking Active Self  ? ?  ?  ? ?  ? ? ?Patient Active Problem List  ? Diagnosis Date Noted  ? Diabetic nephropathy ([place]) 12/14/2021  ? Thrombocytopenia ([place]) 12/13/2021  ? CKD (chronic kidney disease) stage 3, GFR 30-59 ml/min ([place]) 08/11/2021  ? Bilateral lower extremity edema 02/03/2021  ? Daily consumption of alcohol 08/23/2019  ? Elevated LFTs 08/23/2019  ? Anemia due to vitamin B12 deficiency 08/23/2019  ?  History of adenomatous polyp of colon 08/23/2019  ? Chronic pain syndrome 04/18/2019  ? Osteoarthritis of right hip 11/21/2018  ? Type 2 diabetes mellitus with microalbuminuria, without long-term current [place]

## 2022-01-08 NOTE — Patient Instructions (Signed)
Visit Information ? ?Following are the goals we discussed today:  ?Current Barriers:  ?Unable to independently afford treatment regimen ?Suboptimal therapeutic regimen for T2DM, CAD ? ?Pharmacist Clinical Goal(s):  ?patient will verbalize ability to afford treatment regimen ?achieve control of T2DM, CAD as evidenced by GOAL A1C, more efficacious medication therapy ?achieve improvement in T2DM, CAD as evidenced by goal a1c, increased cardiac/kidney protection through collaboration with PharmD and provider.  ? ?Interventions: ?1:1 collaboration with [name], FNP regarding development and update of comprehensive plan of care as evidenced by provider attestation and co-signature ?Inter-disciplinary care team collaboration (see longitudinal plan of care) ?Comprehensive medication review performed; medication list updated in electronic medical record ? ?Diabetes: New goal. ?Controlled-a1c 5.6%; current treatment: JARDIANCE-->FARXIGA '10MG'$  DAILY;  ?Farxiga benefits-->CKD, CAD given patient's cardiac history and diabetes ?Will transition patient to [place] given a hardcopy of finances is unavailable ?Az&me patient assistance doesn't require hardcopy, therefore we will transition patient to farxiga '10mg'$  ?Samples given. Med list updated ?Refills escribed to [facility] in Epic per [place] patient [place] program rules ?Current glucose readings: fasting glucose: <130, post prandial glucose: <160 ?Denies hypoglycemic/hyperglycemic symptoms ?Current exercise: encouraged ?Assessed patient finances. Enrolled in [place]&me patient assistance program; escribed refills to [facility] in Epic #90 supplies with refills until 2024; will ship every 3 months to patient's home ? ?Patient Goals/Self-Care Activities ?patient will:  ?- take medications as prescribed as evidenced by patient report and record review ?check glucose daily or if symptomatic, document, and provide at future appointments ?collaborate with provider on medication access  solutions ?target a minimum of 150 minutes of moderate intensity exercise weekly ?engage in dietary modifications by FOLLOWING A HEART HEALTHY DIET/HEALTHY PLATE METHOD ? ? ? ?Plan: Telephone follow up appointment with care management team member scheduled for:  1 MONTH ? ?Signature ?Regina Eck, PharmD, BCPS ?Clinical Pharmacist, [facility] ?[place]  [place] Phone [phone] ? ? ?Please call the care guide team at [phone] if you need to cancel or reschedule your appointment.  ? ?The patient verbalized understanding of instructions, educational materials, and care plan provided today and declined offer to receive copy of patient instructions, educational materials, and care plan.  ? ?

## 2022-01-10 DIAGNOSIS — E1129 Type 2 diabetes mellitus with other diabetic kidney complication: Secondary | ICD-10-CM

## 2022-01-10 DIAGNOSIS — I2510 Atherosclerotic heart disease of native coronary artery without angina pectoris: Secondary | ICD-10-CM

## 2022-01-10 DIAGNOSIS — R809 Proteinuria, unspecified: Secondary | ICD-10-CM

## 2022-01-11 NOTE — Patient Instructions (Signed)
? ? ? ? ? ? [name] Foster ? 01/11/2022  ?  ? '@PREFPERIOPPHARMACY'$ @ ? ? Your procedure is scheduled on  01/14/2022. ? ? Report to [name] at  [place]  A.[facility]. ? ? Call this number if you have problems the morning of surgery: ? [phone] ? ? Remember: ? Follow the diet and prep instructions given to you by the office. ?  ? Take these medicines the morning of surgery with A SIP OF WATER  ? ?               amlodipine, isosorbide, metoprolol. ?  ? Do not wear jewelry, make-up or nail polish. ? Do not wear lotions, powders, or perfumes, or deodorant. ? Do not shave 48 hours prior to surgery.  Men may shave face and neck. ? Do not bring valuables to the hospital. ? [facility] is not responsible for any belongings or valuables. ? ?Contacts, dentures or bridgework may not be worn into surgery.  Leave your suitcase in the car.  After surgery it may be brought to your room. ? ?For patients admitted to the hospital, discharge time will be determined by your treatment team. ? ?Patients discharged the day of surgery will not be allowed to drive home and must have someone with them for 24 hours.  ? ? ?Special instructions:   DO NOT smoke tobacco or vape for 24 hours before your procedure. ? ?Please read over the following fact sheets that you were given. ?Anesthesia Post-op Instructions and Care and Recovery After Surgery ?  ? ? ? Colonoscopy, Adult, Care After ?The following information offers guidance on how to care for yourself after your procedure. Your health care provider may also give you more specific instructions. If you have problems or questions, contact your health care provider. ?What can I expect after the procedure? ?After the procedure, it is common to have: ?A small amount of blood in your stool for 24 hours after the procedure. ?Some gas. ?Mild cramping or bloating of your abdomen. ?Follow these instructions at home: ?Eating and drinking ? ?Drink enough fluid to keep your urine pale yellow. ?Follow instructions  from your health care provider about eating or drinking restrictions. ?Resume your normal diet as told by your health care provider. Avoid heavy or fried foods that are hard to digest. ?Activity ?Rest as told by your health care provider. ?Avoid sitting for a long time without moving. Get up to take short walks every 1-2 hours. This is important to improve blood flow and breathing. Ask for help if you feel weak or unsteady. ?Return to your normal activities as told by your health care provider. Ask your health care provider what activities are safe for you. ?Managing cramping and bloating ? ?Try walking around when you have cramps or feel bloated. ?If directed, apply heat to your abdomen as told by your health care provider. Use the heat source that your health care provider recommends, such as a moist heat pack or a heating pad. ?Place a towel between your skin and the heat source. ?Leave the heat on for 20-30 minutes. ?Remove the heat if your skin turns bright red. This is especially important if you are unable to feel pain, heat, or cold. You have a greater risk of getting burned. ?General instructions ?If you were given a sedative during the procedure, it can affect you for several hours. Do not drive or operate machinery until your health care provider says that it is safe. ?For the  first 24 hours after the procedure: ?Do not sign important documents. ?Do not drink alcohol. ?Do your regular daily activities at a slower pace than normal. ?Eat soft foods that are easy to digest. ?Take over-the-counter and prescription medicines only as told by your health care provider. ?Keep all follow-up visits. This is important. ?Contact a health care provider if: ?You have blood in your stool 2-3 days after the procedure. ?Get help right away if: ?You have more than a small spotting of blood in your stool. ?You have large blood clots in your stool. ?You have swelling of your abdomen. ?You have nausea or vomiting. ?You have a  fever. ?You have increasing pain in your abdomen that is not relieved with medicine. ?These symptoms may be an emergency. Get help right away. Call 911. ?Do not wait to see if the symptoms will go away. ?Do not drive yourself to the hospital. ?Summary ?After the procedure, it is common to have a small amount of blood in your stool. You may also have mild cramping and bloating of your abdomen. ?If you were given a sedative during the procedure, it can affect you for several hours. Do not drive or operate machinery until your health care provider says that it is safe. ?Get help right away if you have a lot of blood in your stool, nausea or vomiting, a fever, or increased pain in your abdomen. ?This information is not intended to replace advice given to you by your health care provider. Make sure you discuss any questions you have with your health care provider. ?Document Revised: 04/22/2021 Document Reviewed: 04/22/2021 ?Elsevier Patient Education ? [place]. ?Monitored Anesthesia Care, Care After ?This sheet gives you information about how to care for yourself after your procedure. Your health care provider may also give you more specific instructions. If you have problems or questions, contact your health care provider. ?What can I expect after the procedure? ?After the procedure, it is common to have: ?Tiredness. ?Forgetfulness about what happened after the procedure. ?Impaired judgment for important decisions. ?Nausea or vomiting. ?Some difficulty with balance. ?Follow these instructions at home: ?For the time period you were told by your health care provider: ? ?  ? ?Rest as needed. ?Do not participate in activities where you could fall or become injured. ?Do not drive or use machinery. ?Do not drink alcohol. ?Do not take sleeping pills or medicines that cause drowsiness. ?Do not make important decisions or sign legal documents. ?Do not take care of children on your own. ?Eating and drinking ?Follow the  diet that is recommended by your health care provider. ?Drink enough fluid to keep your urine pale yellow. ?If you vomit: ?Drink water, juice, or soup when you can drink without vomiting. ?Make sure you have little or no nausea before eating solid foods. ?General instructions ?Have a responsible adult stay with you for the time you are told. It is important to have someone help care for you until you are awake and alert. ?Take over-the-counter and prescription medicines only as told by your health care provider. ?If you have sleep apnea, surgery and certain medicines can increase your risk for breathing problems. Follow instructions from your health care provider about wearing your sleep device: ?Anytime you are sleeping, including during daytime naps. ?While taking prescription pain medicines, sleeping medicines, or medicines that make you drowsy. ?Avoid smoking. ?Keep all follow-up visits as told by your health care provider. This is important. ?Contact a health care provider if: ?You keep feeling  nauseous or you keep vomiting. ?You feel light-headed. ?You are still sleepy or having trouble with balance after 24 hours. ?You develop a rash. ?You have a fever. ?You have redness or swelling around the IV site. ?Get help right away if: ?You have trouble breathing. ?You have new-onset confusion at home. ?Summary ?For several hours after your procedure, you may feel tired. You may also be forgetful and have poor judgment. ?Have a responsible adult stay with you for the time you are told. It is important to have someone help care for you until you are awake and alert. ?Rest as told. Do not drive or operate machinery. Do not drink alcohol or take sleeping pills. ?Get help right away if you have trouble breathing, or if you suddenly become confused. ?This information is not intended to replace advice given to you by your health care provider. Make sure you discuss any questions you have with your health care  provider. ?Document Revised: 08/04/2021 Document Reviewed: 08/02/2019 ?Elsevier Patient Education ? [place]. ? ?

## 2022-01-12 ENCOUNTER — Encounter (HOSPITAL_COMMUNITY)
Admission: RE | Admit: 2022-01-12 | Discharge: 2022-01-12 | Disposition: A | Discharge status: Home / Self Care | Payer: Medicare Other | Source: Ambulatory Visit | Attending: Internal Medicine | Admitting: Internal Medicine

## 2022-01-14 ENCOUNTER — Encounter (HOSPITAL_COMMUNITY)
Admission: RE | Disposition: A | Discharge status: Home / Self Care | Payer: Self-pay | Source: Home / Self Care | Attending: Internal Medicine

## 2022-01-14 ENCOUNTER — Ambulatory Visit (HOSPITAL_COMMUNITY): Payer: Medicare Other | Admitting: Certified Registered Nurse Anesthetist

## 2022-01-14 ENCOUNTER — Ambulatory Visit (HOSPITAL_BASED_OUTPATIENT_CLINIC_OR_DEPARTMENT_OTHER): Payer: Medicare Other | Admitting: Certified Registered Nurse Anesthetist

## 2022-01-14 ENCOUNTER — Encounter (HOSPITAL_COMMUNITY): Payer: Self-pay | Admitting: Internal Medicine

## 2022-01-14 ENCOUNTER — Ambulatory Visit (HOSPITAL_COMMUNITY)
Admission: RE | Admit: 2022-01-14 | Discharge: 2022-01-14 | Disposition: A | Discharge status: Home / Self Care | Payer: Medicare Other | Attending: Internal Medicine | Admitting: Internal Medicine

## 2022-01-14 DIAGNOSIS — G4730 Sleep apnea, unspecified: Secondary | ICD-10-CM | POA: Diagnosis not present

## 2022-01-14 DIAGNOSIS — Z86010 Personal history of colonic polyps: Secondary | ICD-10-CM | POA: Diagnosis not present

## 2022-01-14 DIAGNOSIS — I10 Essential (primary) hypertension: Secondary | ICD-10-CM | POA: Diagnosis not present

## 2022-01-14 DIAGNOSIS — Z87891 Personal history of nicotine dependence: Secondary | ICD-10-CM | POA: Insufficient documentation

## 2022-01-14 DIAGNOSIS — Z7984 Long term (current) use of oral hypoglycemic drugs: Secondary | ICD-10-CM | POA: Insufficient documentation

## 2022-01-14 DIAGNOSIS — E1121 Type 2 diabetes mellitus with diabetic nephropathy: Secondary | ICD-10-CM | POA: Insufficient documentation

## 2022-01-14 DIAGNOSIS — I129 Hypertensive chronic kidney disease with stage 1 through stage 4 chronic kidney disease, or unspecified chronic kidney disease: Secondary | ICD-10-CM

## 2022-01-14 DIAGNOSIS — I2510 Atherosclerotic heart disease of native coronary artery without angina pectoris: Secondary | ICD-10-CM | POA: Diagnosis not present

## 2022-01-14 DIAGNOSIS — I252 Old myocardial infarction: Secondary | ICD-10-CM | POA: Diagnosis not present

## 2022-01-14 DIAGNOSIS — N1831 Chronic kidney disease, stage 3a: Secondary | ICD-10-CM

## 2022-01-14 DIAGNOSIS — K635 Polyp of colon: Secondary | ICD-10-CM | POA: Insufficient documentation

## 2022-01-14 DIAGNOSIS — Z955 Presence of coronary angioplasty implant and graft: Secondary | ICD-10-CM | POA: Insufficient documentation

## 2022-01-14 DIAGNOSIS — Z7902 Long term (current) use of antithrombotics/antiplatelets: Secondary | ICD-10-CM | POA: Insufficient documentation

## 2022-01-14 HISTORY — PX: COLONOSCOPY WITH PROPOFOL: SHX5780

## 2022-01-14 HISTORY — PX: POLYPECTOMY: SHX5525

## 2022-01-14 LAB — GLUCOSE, CAPILLARY: Glucose-Capillary: 163 mg/dL — ABNORMAL HIGH (ref 70–99)

## 2022-01-14 SURGERY — COLONOSCOPY WITH PROPOFOL
Anesthesia: General

## 2022-01-14 MED ORDER — LIDOCAINE HCL (PF) 2 % IJ SOLN
INTRAMUSCULAR | Status: DC | PRN
  Administered 2022-01-14: 5 mg

## 2022-01-14 MED ORDER — LACTATED RINGERS IV SOLN: INTRAVENOUS | Status: DC | PRN

## 2022-01-14 MED ORDER — PHENYLEPHRINE HCL (PRESSORS) 10 MG/ML IV SOLN
INTRAVENOUS | Status: DC | PRN
  Administered 2022-01-14: 160 ug via INTRAVENOUS

## 2022-01-14 MED ORDER — LIDOCAINE HCL (CARDIAC) PF 100 MG/5ML IV SOSY
PREFILLED_SYRINGE | INTRAVENOUS | Status: DC | PRN
  Administered 2022-01-14: 60 mg via INTRATRACHEAL

## 2022-01-14 MED ORDER — PROPOFOL 500 MG/50ML IV EMUL
INTRAVENOUS | Status: DC | PRN
  Administered 2022-01-14: 150 ug/kg/min via INTRAVENOUS

## 2022-01-14 MED ORDER — LIDOCAINE HCL URETHRAL/MUCOSAL 2 % EX GEL
CUTANEOUS | Status: AC
  Filled 2022-01-14: qty 11

## 2022-01-14 MED ORDER — PROPOFOL 10 MG/ML IV BOLUS
INTRAVENOUS | Status: DC | PRN
  Administered 2022-01-14: 80 mg via INTRAVENOUS

## 2022-01-14 NOTE — Anesthesia Preprocedure Evaluation (Signed)
Anesthesia Evaluation  ?Patient identified by MRN, date of birth, ID band ?Patient awake ? ? ? ?Reviewed: ?Allergy & Precautions, H&P , NPO status , Patient's Chart, lab work & pertinent test results, reviewed documented beta blocker date and time  ? ?Airway ?Mallampati: II ? ?TM Distance: >3 FB ?Neck ROM: full ? ? ? Dental ?no notable dental hx. ? ?  ?Pulmonary ?neg pulmonary ROS, sleep apnea , former smoker,  ?  ?Pulmonary exam normal ?breath sounds clear to auscultation ? ? ? ? ? ? Cardiovascular ?Exercise Tolerance: Good ?hypertension, + CAD, + Past MI and + Cardiac Stents  ?negative cardio ROS ? ? ?Rhythm:regular Rate:Normal ? ? ?  ?Neuro/Psych ?negative neurological ROS ? negative psych ROS  ? GI/Hepatic ?negative GI ROS, Neg liver ROS,   ?Endo/Other  ?negative endocrine ROSdiabetes, Type 2 ? Renal/GU ?CRFRenal diseasenegative Renal ROS  ?negative genitourinary ?  ?Musculoskeletal ? ? Abdominal ?  ?Peds ? Hematology ?negative hematology ROS ?(+) Blood dyscrasia, anemia ,   ?Anesthesia Other Findings ? ? Reproductive/Obstetrics ?negative OB ROS ? ?  ? ? ? ? ? ? ? ? ? ? ? ? ? ?  ?  ? ? ? ? ? ? ? ? ?Anesthesia Physical ?Anesthesia Plan ? ?ASA: 3 ? ?Anesthesia Plan: General  ? ?Post-op Pain Management:   ? ?Induction:  ? ?PONV Risk Score and Plan: Propofol infusion ? ?Airway Management Planned:  ? ?Additional Equipment:  ? ?Intra-op Plan:  ? ?Post-operative Plan:  ? ?Informed Consent: I have reviewed the patients History and Physical, chart, labs and discussed the procedure including the risks, benefits and alternatives for the proposed anesthesia with the patient or authorized representative who has indicated his/her understanding and acceptance.  ? ? ? ?Dental Advisory Given ? ?Plan Discussed with: CRNA ? ?Anesthesia Plan Comments:   ? ? ? ? ? ? ?Anesthesia Quick Evaluation ? ?

## 2022-01-14 NOTE — Op Note (Signed)
[facility] ?Patient Name: [name] Foster ?Procedure Date: 01/14/2022 10:01 AM ?MRN: [id number] ?Date of Birth: 01/30/1956 ?Attending MD: Norvel Richards , MD ?CSN: [id number] ?Age: 66 ?Admit Type: Outpatient ?Procedure:                Colonoscopy ?Indications:              High risk colon cancer surveillance: Personal  ?                          history of colonic polyps ?Providers:                Norvel Richards, MD, Janeece Riggers, RN, Eugene Garnet  ?                          Shanon Brow, Technician ?Referring MD:              ?Medicines:                Propofol per Anesthesia ?Complications:            No immediate complications. ?Estimated Blood Loss:     Estimated blood loss was minimal. ?Procedure:                Pre-Anesthesia Assessment: ?                          - Prior to the procedure, a History and Physical  ?                          was performed, and patient medications and  ?                          allergies were reviewed. The patient's tolerance of  ?                          previous anesthesia was also reviewed. The risks  ?                          and benefits of the procedure and the sedation  ?                          options and risks were discussed with the patient.  ?                          All questions were answered, and informed consent  ?                          was obtained. ASA Grade Assessment: III - A patient  ?                          with severe systemic disease. After reviewing the  ?                          risks and benefits, the patient was deemed in  ?  satisfactory condition to undergo the procedure. ?                          After obtaining informed consent, the colonoscope  ?                          was passed under direct vision. Throughout the  ?                          procedure, the patient's blood pressure, pulse, and  ?                          oxygen saturations were monitored continuously. The  ?                           [id number]) scope was introduced through the  ?                          anus and advanced to the the cecum, identified by  ?                          appendiceal orifice and ileocecal valve. The  ?                          colonoscopy was performed without difficulty. The  ?                          patient tolerated the procedure well. The quality  ?                          of the bowel preparation was adequate. ?Scope In: 10:21:02 AM ?Scope Out: 10:35:25 AM ?Scope Withdrawal Time: 0 hours 12 minutes 44 seconds  ?Total Procedure Duration: 0 hours 14 minutes 23 seconds  ?Findings: ?     The perianal and digital rectal examinations were normal. ?     A 5 mm polyp was found in the descending colon. The polyp was sessile.  ?     The polyp was removed with a cold snare. Resection and retrieval were  ?     complete. Estimated blood loss was minimal. ?     The exam was otherwise without abnormality on direct and retroflexion  ?     views. ?Impression:               - One 5 mm polyp in the descending colon, removed  ?                          with a cold snare. Resected and retrieved. ?                          - The examination was otherwise normal on direct  ?                          and retroflexion views. ?Moderate Sedation: ?     Moderate (conscious) sedation was personally administered by an  ?     anesthesia  professional. The following parameters were monitored: oxygen  ?     saturation, heart rate, blood pressure, respiratory rate, EKG, adequacy  ?     of pulmonary ventilation, and response to care. ?Recommendation:           - Patient has a contact number available for  ?                          emergencies. The signs and symptoms of potential  ?                          delayed complications were discussed with the  ?                          patient. Return to normal activities tomorrow.  ?                          Written discharge instructions were provided to the  ?                           patient. ?                          - Advance diet as tolerated. ?                          - Continue present medications. ?                          - Repeat colonoscopy after studies are complete for  ?                          surveillance. ?                          - Return to GI office (date not yet determined). ?Procedure Code(s):        --- Professional --- ?                          [phone], Colonoscopy, flexible; with removal of  ?                          tumor(s), polyp(s), or other lesion(s) by snare  ?                          technique ?Diagnosis Code(s):        --- Professional --- ?                          Z86.010, Personal history of colonic polyps ?                          K63.5, Polyp of colon ?CPT copyright 2019 American Medical Association. All rights reserved. ?The codes documented in this report are preliminary and upon coder review may  ?be revised to meet current compliance requirements. ?Cristopher Estimable. Amai Cappiello, MD ?Norvel Richards, MD ?01/14/2022 10:45:03 AM ?This report has been  signed electronically. ?Number of Addenda: 0 ?

## 2022-01-14 NOTE — Transfer of Care (Signed)
Immediate Anesthesia Transfer of Care Note ? ?Patient: [name] Foster ? ?Procedure(s) Performed: COLONOSCOPY WITH PROPOFOL ?POLYPECTOMY ? ?Patient Location: Short Stay ? ?Anesthesia Type:General ? ?Level of Consciousness: awake, alert  and oriented ? ?Airway & Oxygen Therapy: Patient Spontanous Breathing ? ?Post-op Assessment: Report given to RN and Post -op Vital signs reviewed and stable ? ?Post vital signs: Reviewed and stable ? ?Last Vitals:  ?Vitals Value Taken Time  ?BP    ?Temp    ?Pulse    ?Resp    ?SpO2    ? ? ?Last Pain:  ?Vitals:  ? 01/14/22 1022  ?TempSrc:   ?PainSc: 0-No pain  ?   ? ?Patients Stated Pain Goal: 8 (01/14/22 1008) ? ?Complications: No notable events documented. ?

## 2022-01-14 NOTE — Discharge Instructions (Signed)
?  Colonoscopy ?Discharge Instructions ? ?Read the instructions outlined below and refer to this sheet in the next few weeks. These discharge instructions provide you with general information on caring for yourself after you leave the hospital. Your doctor may also give you specific instructions. While your treatment has been planned according to the most current medical practices available, unavoidable complications occasionally occur. If you have any problems or questions after discharge, call Dr. Gala Romney at [phone]. ?ACTIVITY ?You may resume your regular activity, but move at a slower pace for the next 24 hours.  ?Take frequent rest periods for the next 24 hours.  ?Walking will help get rid of the air and reduce the bloated feeling in your belly (abdomen).  ?No driving for 24 hours (because of the medicine (anesthesia) used during the test).   ?Do not sign any important legal documents or operate any machinery for 24 hours (because of the anesthesia used during the test).  ?NUTRITION ?Drink plenty of fluids.  ?You may resume your normal diet as instructed by your doctor.  ?Begin with a light meal and progress to your normal diet. Heavy or fried foods are harder to digest and may make you feel sick to your stomach (nauseated).  ?Avoid alcoholic beverages for 24 hours or as instructed.  ?MEDICATIONS ?You may resume your normal medications unless your doctor tells you otherwise.  ?WHAT YOU CAN EXPECT TODAY ?Some feelings of bloating in the abdomen.  ?Passage of more gas than usual.  ?Spotting of blood in your stool or on the toilet paper.  ?IF YOU HAD POLYPS REMOVED DURING THE COLONOSCOPY: ?No aspirin products for 7 days or as instructed.  ?No alcohol for 7 days or as instructed.  ?Eat a soft diet for the next 24 hours.  ?FINDING OUT THE RESULTS OF YOUR TEST ?Not all test results are available during your visit. If your test results are not back during the visit, make an appointment with your caregiver to find out the  results. Do not assume everything is normal if you have not heard from your caregiver or the medical facility. It is important for you to follow up on all of your test results.  ?SEEK IMMEDIATE MEDICAL ATTENTION IF: ?You have more than a spotting of blood in your stool.  ?Your belly is swollen (abdominal distention).  ?You are nauseated or vomiting.  ?You have a temperature over 101.  ?You have abdominal pain or discomfort that is severe or gets worse throughout the day.   ? ? ?Only 1 small polyp found today-it was removed ? ? further recommendations to follow pending review of pathology report ? ? at patient request, I called Leah at [phone] -  reviewed findings. ?

## 2022-01-14 NOTE — Anesthesia Postprocedure Evaluation (Signed)
Anesthesia Post Note ? ?Patient: [name] ? ?Procedure(s) Performed: COLONOSCOPY WITH PROPOFOL ?POLYPECTOMY ? ?Patient location during evaluation: Phase II ?Anesthesia Type: General ?Level of consciousness: awake ?Pain management: pain level controlled ?Vital Signs Assessment: post-procedure vital signs reviewed and stable ?Respiratory status: spontaneous breathing and respiratory function stable ?Cardiovascular status: blood pressure returned to baseline and stable ?Postop Assessment: no headache and no apparent nausea or vomiting ?Anesthetic complications: no ?Comments: Late entry ? ? ?No notable events documented. ? ? ?Last Vitals:  ?Vitals:  ? 01/14/22 1008 01/14/22 1039  ?BP: (!) 157/82 107/67  ?Pulse: 85 75  ?Resp: 18 (!) 21  ?Temp: 36.9 ?C 37.3 ?C  ?SpO2: 95% 94%  ?  ?Last Pain:  ?Vitals:  ? 01/14/22 1039  ?TempSrc:   ?PainSc: 0-No pain  ? ? ?  ?  ?  ?  ?  ?  ? ?[name] Foster ? ? ? ? ?

## 2022-01-14 NOTE — H&P (Signed)
?$'@LOGO'L$ @ ? ? ?Primary Care Physician:  [name], FNP ?Primary Gastroenterologist:  Dr. Gala Romney ? ?Pre-Procedure History & Physical: ?HPI:  [name] Foster is a 66 y.o. male here for Surveillance colonoscopy.  Patient overdue for surveillance.  Had multiple polyps removed his colon 2021-multiple are were advanced adenomas (based on size).  He has done well.  He is here for a surveillance examination. ? ?Past Medical History:  ?Diagnosis Date  ? Arthritis   ? CAD (coronary artery disease)   ? a. 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 DES to left PDA 09/20/17 // c. 10/19: LCx and LPDA stents ok; s/p DES to pLAD and POBA to D1  ? Diabetes mellitus without complication ([place])   ? type 2   ? Diabetic nephropathy ([place])   ? Echocardiogram 08/2021   ? Echocardiogram 12/22: EF 60-65, no RWMA, GLS -19.4% (normal), normal RVSF, trivial MR, mild dilation of ascending aorta (37 mm)  ? Heart attack ([place])   ? " a blood clot is actually what caused my heart attack in 2018 " , he states he is unsure where the blood clot originated from   ? Hyperlipidemia   ? Hypertension   ? Insomnia   ? Pulmonary hypertension ([place])   ? a. TTE 03/25/17; EF 60-65%, nl WM, LV diastolic fxn nl, trivial AI, RV cavity size, wall thickness, and sys fxn nl, trivial TR, PASP 39 mmHg, trivial pericardial effusion // Echo 12/22: normal RVSP, PASP  ? 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 Mid Parietal Scalp (well diff)  ? Sleep apnea   ? could not use a cpap  ? Vitamin B12 deficiency 05/03/2020  ? Wears dentures   ? full top-partial bottom  ? Wears glasses   ? ? ?Past Surgical History:  ?Procedure Laterality Date  ? COLONOSCOPY  05/13/2010  ? Single anal papilla, 2 diminutive polyps at 10 cm, left-sided diverticula, one sigmoid and one a sending polyp, otherwise normal colon and TI.  Path with 1 tubular adenoma,  otherwise hyperplastic polyps.  Recommendations repeat colonoscopy in 7 years.  ? COLONOSCOPY WITH PROPOFOL N/A 11/22/2019  ? Daneil Dolin, MD -  ten 4-9 mm polyps in the rectum, sigmoid colon and cecum were removed, three 12-25 mm polyps at the splenic flexure and hepatic flexure removed with clip placement to the largest polyp  ? CORONARY ATHERECTOMY N/A 07/03/2018  ? Procedure: CORONARY ATHERECTOMY;  Surgeon: Burnell Blanks, MD;  Location: [facility];  Service: Cardiovascular;  Laterality: N/A;  ? CORONARY STENT INTERVENTION N/A 09/20/2017  ? Procedure: CORONARY STENT INTERVENTION;  Surgeon: Leonie Man, MD;  Location: [facility];  Service: Cardiovascular;  Laterality: N/A;  ? CORONARY STENT INTERVENTION N/A 07/03/2018  ? Procedure: CORONARY STENT INTERVENTION;  Surgeon: Burnell Blanks, MD;  Location: [facility];  Service: Cardiovascular;  Laterality: N/A;  ? CORONARY/GRAFT ACUTE MI REVASCULARIZATION N/A 03/25/2017  ? Procedure: Coronary/Graft Acute MI Revascularization;  Surgeon: Sherren Mocha, MD;  Location: [facility];  Service: Cardiovascular;  Laterality: N/A;  ? GANGLION CYST EXCISION Left 07/25/2014  ? Procedure: EXCISION VOLAR AND DORSAL GANGLION LEFT WRIST;  Surgeon: Leanora Cover, MD;  Location: [facility];  Service: Orthopedics;  Laterality: Left;  ? INTRAVASCULAR PRESSURE WIRE/FFR STUDY N/A 09/20/2017  ? Procedure: INTRAVASCULAR PRESSURE  WIRE/FFR STUDY;  Surgeon: Leonie Man, MD;  Location: [facility];  Service: Cardiovascular;  Laterality: N/A;  ? INTRAVASCULAR PRESSURE WIRE/FFR STUDY N/A 06/30/2018  ? Procedure: INTRAVASCULAR PRESSURE WIRE/FFR STUDY;  Surgeon: Nelva Bush, MD;  Location: [facility];  Service: Cardiovascular;  Laterality: N/A;  ? KNEE ARTHROSCOPY    ? rifgr and left  ? LEFT HEART CATH AND CORONARY ANGIOGRAPHY N/A 03/25/2017  ? Procedure: Left Heart Cath and Coronary Angiography;   Surgeon: Sherren Mocha, MD;  Location: [facility];  Service: Cardiovascular;  Laterality: N/A;  ? LEFT HEART CATH AND CORONARY ANGIOGRAPHY N/A 09/20/2017  ? Procedure: LEFT HEART CATH AND CORONARY ANGIOGRAPHY;  Surgeon: Leonie Man, MD;  Location: [facility];  Service: Cardiovascular;  Laterality: N/A;  ? LEFT HEART CATH AND CORONARY ANGIOGRAPHY N/A 06/30/2018  ? Procedure: LEFT HEART CATH AND CORONARY ANGIOGRAPHY;  Surgeon: Nelva Bush, MD;  Location: [facility];  Service: Cardiovascular;  Laterality: N/A;  ? POLYPECTOMY  11/22/2019  ? Procedure: POLYPECTOMY;  Surgeon: Daneil Dolin, MD;  [facility];  Service: Endoscopy;;  ? SHOULDER ARTHROSCOPY  2012  ? left  ? SHOULDER ARTHROSCOPY    ? right  ? TEMPORARY PACEMAKER N/A 03/25/2017  ? Procedure: Temporary Pacemaker;  Surgeon: Sherren Mocha, MD;  Location: [facility];  Service: Cardiovascular;  Laterality: N/A;  ? TONSILLECTOMY    ? TOTAL HIP ARTHROPLASTY Right 04/25/2019  ? TOTAL HIP ARTHROPLASTY Right 04/25/2019  ? Procedure: TOTAL HIP ARTHROPLASTY ANTERIOR APPROACH;  Surgeon: Rod Can, MD;  Location: [facility];  Service: Orthopedics;  Laterality: Right;  ? TOTAL KNEE ARTHROPLASTY  2002  ? left  ? ? ?Prior to Admission medications   ?Medication Sig Start Date End Date Taking? Authorizing Provider  ?albuterol (VENTOLIN HFA) 108 (90 Base) MCG/ACT inhaler TAKE 2 PUFFS BY MOUTH EVERY 6 HOURS AS NEEDED FOR WHEEZE OR SHORTNESS OF BREATH 12/23/21  Yes [name] F, FNP  ?amLODipine (NORVASC) 5 MG tablet Take 1 tablet (5 mg total) by mouth daily. 12/10/21  Yes [name], FNP  ?atorvastatin (LIPITOR) 40 MG tablet TAKE 1 TABLET BY MOUTH DAILY AT 6 PM. 12/17/21  Yes [name], FNP  ?clopidogrel (PLAVIX) 75 MG tablet Take 1 tablet (75 mg total) by mouth daily. 12/10/21  Yes [name], FNP  ?Coenzyme Q10 (COQ-10) 100 MG CAPS Take 100 mg by mouth daily.   Yes [provider]   ?empagliflozin (JARDIANCE) 10 MG TABS tablet Take 10 mg by mouth daily.   Yes [provider]  ?hydrALAZINE (APRESOLINE) 50 MG tablet Take 1 tablet (50 mg total) by mouth every 8 (eight) hours. ?Patient taking differently: Take 50 mg by mouth in the morning and at bedtime. 12/10/21  Yes [name], FNP  ?hydrochlorothiazide (HYDRODIURIL) 12.5 MG tablet Take 1 tablet (12.5 mg total) by mouth daily. (NEEDS TO BE SEEN BEFORE NEXT REFILL) 11/13/21  Yes [name], FNP  ?isosorbide mononitrate (IMDUR) 30 MG 24 hr tablet TAKE 1/2 OF A TABLET (15 MG TOTAL) BY MOUTH DAILY 10/19/21  Yes [name], FNP  ?losartan (COZAAR) 100 MG tablet TAKE 1 TABLET BY MOUTH EVERY DAY 11/24/21  Yes [name], FNP  ?metoprolol tartrate (LOPRESSOR) 50 MG tablet Take 1 tablet (50 mg total) by mouth 2 (two) times daily. 12/10/21  Yes [name], FNP  ?mirtazapine (REMERON) 7.5 MG tablet TAKE 1 TABLET BY  MOUTH EVERYDAY AT BEDTIME 12/02/21  Yes [name], FNP  ?polyethylene glycol-electrolytes (NULYTELY) 420 g solution As directed 12/16/21  Yes Khya Halls, Cristopher Estimable, MD  ?prednisoLONE acetate (PRED FORTE) 1 % ophthalmic suspension Place 1 drop into both eyes daily. 12/11/21  Yes [provider]  ?RESTASIS 0.05 % ophthalmic emulsion Place 1 drop into both eyes daily. 04/21/20  Yes [provider]  ?Simethicone (GAS-X PO) Take 1 tablet by mouth daily.   Yes [provider]  ?dapagliflozin propanediol (FARXIGA) 10 MG TABS tablet Take 1 tablet (10 mg total) by mouth daily before breakfast. 01/08/22   [name], FNP  ?nitroGLYCERIN (NITROSTAT) 0.4 MG SL tablet DISSOLVE 1 TAB UNDER TONGUE AT ONSET OF CHEST PAIN. REPEAT EVERY 5 MINUTES FOR 2 DOSES AS NEEDED 03/05/20   [name], FNP  ? ? ?Allergies as of 12/16/2021  ? (No Known Allergies)  ? ? ?Family History  ?Problem Relation Age of Onset  ? Hypertension Mother   ? Hypertension Father   ? Hypertension Brother   ? Heart disease Maternal  Grandfather   ? Colon cancer Neg Hx   ? ? ?Social History  ? ?Socioeconomic History  ? Marital status: Widowed  ?  Spouse name: Not on file  ? Number of children: 2  ? Years of education: Not on file  ? Highest education le

## 2022-01-15 ENCOUNTER — Telehealth: Payer: Self-pay

## 2022-01-15 LAB — SURGICAL PATHOLOGY

## 2022-01-15 NOTE — Telephone Encounter (Signed)
Received notification from [employer] regarding approval for [facility]. Patient assistance approved from 01/12/22 to 09/12/22. ? ?Allow 7-10 business days for medication to ship ? ?Phone: [phone] ? ?

## 2022-01-18 ENCOUNTER — Encounter: Payer: Self-pay | Admitting: Internal Medicine

## 2022-01-20 ENCOUNTER — Other Ambulatory Visit: Payer: Self-pay | Admitting: Nephrology

## 2022-01-20 ENCOUNTER — Other Ambulatory Visit (HOSPITAL_COMMUNITY): Payer: Self-pay | Admitting: Nephrology

## 2022-01-20 DIAGNOSIS — I129 Hypertensive chronic kidney disease with stage 1 through stage 4 chronic kidney disease, or unspecified chronic kidney disease: Secondary | ICD-10-CM

## 2022-01-20 DIAGNOSIS — E1122 Type 2 diabetes mellitus with diabetic chronic kidney disease: Secondary | ICD-10-CM

## 2022-01-20 DIAGNOSIS — E1129 Type 2 diabetes mellitus with other diabetic kidney complication: Secondary | ICD-10-CM

## 2022-01-21 ENCOUNTER — Encounter (HOSPITAL_COMMUNITY): Payer: Self-pay | Admitting: Internal Medicine

## 2022-01-24 ENCOUNTER — Other Ambulatory Visit: Payer: Self-pay | Admitting: Family Medicine

## 2022-01-24 DIAGNOSIS — I2510 Atherosclerotic heart disease of native coronary artery without angina pectoris: Secondary | ICD-10-CM

## 2022-01-24 DIAGNOSIS — I10 Essential (primary) hypertension: Secondary | ICD-10-CM

## 2022-01-24 DIAGNOSIS — R809 Proteinuria, unspecified: Secondary | ICD-10-CM

## 2022-01-29 ENCOUNTER — Inpatient Hospital Stay (HOSPITAL_COMMUNITY): Payer: Medicare Other | Attending: Hematology | Admitting: Hematology

## 2022-01-29 ENCOUNTER — Other Ambulatory Visit (HOSPITAL_COMMUNITY): Payer: Medicare Other

## 2022-01-29 ENCOUNTER — Encounter (HOSPITAL_COMMUNITY): Payer: Self-pay | Admitting: Hematology

## 2022-01-29 ENCOUNTER — Inpatient Hospital Stay (HOSPITAL_COMMUNITY): Payer: Medicare Other

## 2022-01-29 ENCOUNTER — Ambulatory Visit (HOSPITAL_COMMUNITY)
Admission: RE | Admit: 2022-01-29 | Discharge: 2022-01-29 | Disposition: A | Discharge status: Home / Self Care | Payer: Medicare Other | Source: Ambulatory Visit | Attending: Nephrology | Admitting: Nephrology

## 2022-01-29 VITALS — BP 146/77 | HR 63 | Temp 98.0°F | Resp 20 | Ht 68.0 in | Wt 196.4 lb

## 2022-01-29 DIAGNOSIS — R634 Abnormal weight loss: Secondary | ICD-10-CM | POA: Insufficient documentation

## 2022-01-29 DIAGNOSIS — R809 Proteinuria, unspecified: Secondary | ICD-10-CM | POA: Insufficient documentation

## 2022-01-29 DIAGNOSIS — Z8673 Personal history of transient ischemic attack (TIA), and cerebral infarction without residual deficits: Secondary | ICD-10-CM | POA: Diagnosis not present

## 2022-01-29 DIAGNOSIS — E119 Type 2 diabetes mellitus without complications: Secondary | ICD-10-CM | POA: Insufficient documentation

## 2022-01-29 DIAGNOSIS — Z87891 Personal history of nicotine dependence: Secondary | ICD-10-CM | POA: Diagnosis not present

## 2022-01-29 DIAGNOSIS — I129 Hypertensive chronic kidney disease with stage 1 through stage 4 chronic kidney disease, or unspecified chronic kidney disease: Secondary | ICD-10-CM

## 2022-01-29 DIAGNOSIS — Z86711 Personal history of pulmonary embolism: Secondary | ICD-10-CM | POA: Diagnosis not present

## 2022-01-29 DIAGNOSIS — E1122 Type 2 diabetes mellitus with diabetic chronic kidney disease: Secondary | ICD-10-CM

## 2022-01-29 DIAGNOSIS — E1129 Type 2 diabetes mellitus with other diabetic kidney complication: Secondary | ICD-10-CM | POA: Insufficient documentation

## 2022-01-29 DIAGNOSIS — I10 Essential (primary) hypertension: Secondary | ICD-10-CM | POA: Diagnosis not present

## 2022-01-29 DIAGNOSIS — D696 Thrombocytopenia, unspecified: Secondary | ICD-10-CM | POA: Insufficient documentation

## 2022-01-29 LAB — RETICULOCYTES
Immature Retic Fract: 26.7 % — ABNORMAL HIGH (ref 2.3–15.9)
RBC.: 4.13 MIL/uL — ABNORMAL LOW (ref 4.22–5.81)
Retic Count, Absolute: 95 10*3/uL (ref 19.0–186.0)
Retic Ct Pct: 2.3 % (ref 0.4–3.1)

## 2022-01-29 LAB — CBC WITH DIFFERENTIAL/PLATELET
Abs Immature Granulocytes: 0.05 10*3/uL (ref 0.00–0.07)
Basophils Absolute: 0 10*3/uL (ref 0.0–0.1)
Basophils Relative: 1 %
Eosinophils Absolute: 0 10*3/uL (ref 0.0–0.5)
Eosinophils Relative: 1 %
HCT: 40.6 % (ref 39.0–52.0)
Hemoglobin: 12.8 g/dL — ABNORMAL LOW (ref 13.0–17.0)
Immature Granulocytes: 1 %
Lymphocytes Relative: 13 %
Lymphs Abs: 0.7 10*3/uL (ref 0.7–4.0)
MCH: 30.8 pg (ref 26.0–34.0)
MCHC: 31.5 g/dL (ref 30.0–36.0)
MCV: 97.6 fL (ref 80.0–100.0)
Monocytes Absolute: 0.3 10*3/uL (ref 0.1–1.0)
Monocytes Relative: 6 %
Neutro Abs: 4.3 10*3/uL (ref 1.7–7.7)
Neutrophils Relative %: 78 %
Platelets: 122 10*3/uL — ABNORMAL LOW (ref 150–400)
RBC: 4.16 MIL/uL — ABNORMAL LOW (ref 4.22–5.81)
RDW: 15.8 % — ABNORMAL HIGH (ref 11.5–15.5)
WBC: 5.5 10*3/uL (ref 4.0–10.5)
nRBC: 0 % (ref 0.0–0.2)

## 2022-01-29 LAB — HEPATITIS C ANTIBODY: HCV Ab: NONREACTIVE

## 2022-01-29 LAB — HEPATITIS B CORE ANTIBODY, TOTAL: Hep B Core Total Ab: NONREACTIVE

## 2022-01-29 LAB — HEPATITIS B SURFACE ANTIGEN: Hepatitis B Surface Ag: NONREACTIVE

## 2022-01-29 LAB — FOLATE: Folate: 2.6 ng/mL — ABNORMAL LOW (ref >5.9)

## 2022-01-29 LAB — LACTATE DEHYDROGENASE: LDH: 196 U/L — ABNORMAL HIGH (ref 98–192)

## 2022-01-29 LAB — VITAMIN B12: Vitamin B-12: 219 pg/mL (ref 180–914)

## 2022-01-29 LAB — HEPATITIS B SURFACE ANTIBODY,QUALITATIVE: Hep B S Ab: REACTIVE — AB

## 2022-01-29 NOTE — Patient Instructions (Addendum)
[place] at [facility] Discharge Instructions  You were seen and examined today by Dr. Delton Coombes. Dr. Delton Coombes is a hematologist, meaning that he specializes in blood abnormalities. Dr. Delton Coombes discussed your past medical history, family history of cancers/blood conditions and the events that led to you being here today.  You were referred to Dr. Delton Coombes due to thrombocytopenia (low platelet count). Platelets are the clotting factor within your blood. When they are low, it places you at an increased risk for bleeding.  Dr. Delton Coombes has recommended additional lab work today.  Because of your recent weight loss and abdominal symptoms, Dr. Delton Coombes has ordered a CT scan of your abdomen.  Follow-up as scheduled.    Thank you for choosing [place] at [facility] to provide your oncology and hematology care.  To afford each patient quality time with our provider, please arrive at least 15 minutes before your scheduled appointment time.   If you have a lab appointment with the [facility] please come in thru the Main Entrance and check in at the main information desk.  You need to re-schedule your appointment should you arrive 10 or more minutes late.  We strive to give you quality time with our providers, and arriving late affects you and other patients whose appointments are after yours.  Also, if you no show three or more times for appointments you may be dismissed from the clinic at the providers discretion.     Again, thank you for choosing [facility].  Our hope is that these requests will decrease the amount of time that you wait before being seen by our physicians.       _____________________________________________________________  Should you have questions after your visit to [facility], please contact our office at [phone] and follow the prompts.  Our office hours are 8:00 a.m. and 4:30 p.m.  Monday - Friday.  Please note that voicemails left after 4:00 p.m. may not be returned until the following business day.  We are closed weekends and major holidays.  You do have access to a nurse 24-7, just call the main number to the clinic [phone] and do not press any options, hold on the line and a nurse will answer the phone.    For prescription refill requests, have your pharmacy contact our office and allow 72 hours.    Due to Covid, you will need to wear a mask upon entering the hospital. If you do not have a mask, a mask will be given to you at the Main Entrance upon arrival. For doctor visits, patients may have 1 support person age 54 or older with them. For treatment visits, patients can not have anyone with them due to social distancing guidelines and our immunocompromised population.

## 2022-01-29 NOTE — Progress Notes (Signed)
[facility] [address], [place] [address]   CLINIC:  Medical Oncology/Hematology  Patient Care Team: [name], FNP as PCP - General (Family Medicine) Sherren Mocha, MD as PCP - Cardiology (Cardiology) [facility] as Consulting Physician (Orthopedic Surgery) Lavonna Monarch, MD as Consulting Physician (Dermatology) Sharmon Revere as Physician Assistant (Cardiology) Harlen Labs, MD as Referring Physician (Optometry) Lavera Guise, [facility] as Pharmacist (Family Medicine) Derek Jack, MD as Medical Oncologist (Hematology)  CHIEF COMPLAINTS/PURPOSE OF CONSULTATION:  Evaluation for thrombocytopenia  HISTORY OF PRESENTING ILLNESS:  [name] Foster 66 y.o. male is here because of evaluation for thrombocytopenia, at the request of [facility].  Today he reports feeling good, and he is accompanied by his daughter. He reports easy bruising on his arms as he has been on Plavix since 2018 following a MI. He reports 1 episode of severe nosebleed 1 month ago which took 10 minutes to stop; this has not recurred, and he denies hematochezia and hematuria. He has lost 10 lbs over the past 6 months, and his appetite is poor. He denies recent infections. He reports occasional abdominal bloating, and he denies correlation with eating. He reports occasional watery diarrhea within 20 minutes after eating. The bloating improves after an episode of diarrhea. He denies abdominal pain, but reports feeling pressure from the abdominal bloating. He has a history of DM. He denies skin rash.   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 He reports possible chemical exposure when working for [employer]. He denies pesticide exposure. He quit smoking in 2018 after smoking 2 ppd for 50 years. He drinks about 1 pint of whiskey daily. He denies family history of thrombocytopenia. His brother had multiple myeloma.   MEDICAL HISTORY:  Past Medical History:  Diagnosis Date    Arthritis    CAD (coronary artery disease)    a. 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 DES to left PDA 09/20/17 // c. 10/19: LCx and LPDA stents ok; s/p DES to pLAD and POBA to D1   Diabetes mellitus without complication ([place])    type 2    Diabetic nephropathy ([place])    Echocardiogram 08/2021    Echocardiogram 12/22: EF 60-65, no RWMA, GLS -19.4% (normal), normal RVSF, trivial MR, mild dilation of ascending aorta (37 mm)   Heart attack ([place])    " a blood clot is actually what caused my heart attack in 2018 " , he states he is unsure where the blood clot originated from    Hyperlipidemia    Hypertension    Insomnia    Pulmonary hypertension ([place])    a. TTE 03/25/17; EF 60-65%, nl WM, LV diastolic fxn nl, trivial AI, RV cavity size, wall thickness, and sys fxn nl, trivial TR, PASP 39 mmHg, trivial pericardial effusion // Echo 12/22: normal RVSP, PASP   SCCA (squamous cell carcinoma) of skin 03/26/2020   Mid Parietal Scalp (well diff)   Sleep apnea    could not use a cpap   Vitamin B12 deficiency 05/03/2020   Wears dentures    full top-partial bottom   Wears glasses     SURGICAL HISTORY: Past Surgical History:  Procedure Laterality Date   COLONOSCOPY  05/13/2010   Single anal papilla, 2 diminutive polyps at 10 cm, left-sided diverticula, one sigmoid and one a sending polyp,  otherwise normal colon and TI.  Path with 1 tubular adenoma, otherwise hyperplastic polyps.  Recommendations repeat colonoscopy in 7 years.   COLONOSCOPY WITH PROPOFOL N/A 11/22/2019   Rourk, Cristopher Estimable, MD -  ten 4-9 mm polyps in the rectum, sigmoid colon and cecum were removed, three 12-25 mm polyps at the splenic flexure and hepatic flexure removed with clip placement to the largest polyp   COLONOSCOPY WITH PROPOFOL N/A 01/14/2022   Procedure: COLONOSCOPY WITH PROPOFOL;  Surgeon: Daneil Dolin,  MD;  [facility];  Service: Endoscopy;  Laterality: N/A;  11:15am, asa 3   CORONARY ATHERECTOMY N/A 07/03/2018   Procedure: CORONARY ATHERECTOMY;  Surgeon: Burnell Blanks, MD;  Location: [facility];  Service: Cardiovascular;  Laterality: N/A;   CORONARY STENT INTERVENTION N/A 09/20/2017   Procedure: CORONARY STENT INTERVENTION;  Surgeon: Leonie Man, MD;  Location: [place] [facility];  Service: Cardiovascular;  Laterality: N/A;   CORONARY STENT INTERVENTION N/A 07/03/2018   Procedure: CORONARY STENT INTERVENTION;  Surgeon: Burnell Blanks, MD;  Location: [place] [facility];  Service: Cardiovascular;  Laterality: N/A;   CORONARY/GRAFT ACUTE MI REVASCULARIZATION N/A 03/25/2017   Procedure: Coronary/Graft Acute MI Revascularization;  Surgeon: Sherren Mocha, MD;  Location: [place] [facility];  Service: Cardiovascular;  Laterality: N/A;   GANGLION CYST EXCISION Left 07/25/2014   Procedure: EXCISION VOLAR AND DORSAL GANGLION LEFT WRIST;  Surgeon: Leanora Cover, MD;  Location: [facility];  Service: Orthopedics;  Laterality: Left;   INTRAVASCULAR PRESSURE WIRE/FFR STUDY N/A 09/20/2017   Procedure: INTRAVASCULAR PRESSURE WIRE/FFR STUDY;  Surgeon: Leonie Man, MD;  Location: [place] [facility];  Service: Cardiovascular;  Laterality: N/A;   INTRAVASCULAR PRESSURE WIRE/FFR STUDY N/A 06/30/2018   Procedure: INTRAVASCULAR PRESSURE WIRE/FFR STUDY;  Surgeon: Nelva Bush, MD;  Location: [place] [facility];  Service: Cardiovascular;  Laterality: N/A;   KNEE ARTHROSCOPY     rifgr and left   LEFT HEART CATH AND CORONARY ANGIOGRAPHY N/A 03/25/2017   Procedure: Left Heart Cath and Coronary Angiography;  Surgeon: Sherren Mocha, MD;  Location: [place] [facility];  Service: Cardiovascular;  Laterality: N/A;   LEFT HEART CATH AND CORONARY ANGIOGRAPHY N/A 09/20/2017   Procedure: LEFT HEART CATH AND CORONARY ANGIOGRAPHY;  Surgeon: Leonie Man, MD;   Location: [place] [facility];  Service: Cardiovascular;  Laterality: N/A;   LEFT HEART CATH AND CORONARY ANGIOGRAPHY N/A 06/30/2018   Procedure: LEFT HEART CATH AND CORONARY ANGIOGRAPHY;  Surgeon: Nelva Bush, MD;  Location: [place] [facility];  Service: Cardiovascular;  Laterality: N/A;   POLYPECTOMY  11/22/2019   Procedure: POLYPECTOMY;  Surgeon: Daneil Dolin, MD;  [facility];  Service: Endoscopy;;   POLYPECTOMY  01/14/2022   Procedure: POLYPECTOMY;  Surgeon: Daneil Dolin, MD;  [facility];  Service: Endoscopy;;   SHOULDER ARTHROSCOPY  2012   left   SHOULDER ARTHROSCOPY     right   TEMPORARY PACEMAKER N/A 03/25/2017   Procedure: Temporary Pacemaker;  Surgeon: Sherren Mocha, MD;  Location: [place] [facility];  Service: Cardiovascular;  Laterality: N/A;   TONSILLECTOMY     TOTAL HIP ARTHROPLASTY Right 04/25/2019   TOTAL HIP ARTHROPLASTY Right 04/25/2019   Procedure: TOTAL HIP ARTHROPLASTY ANTERIOR APPROACH;  Surgeon: Rod Can, MD;  [facility];  Service: Orthopedics;  Laterality: Right;   TOTAL KNEE ARTHROPLASTY  2002   left    SOCIAL HISTORY: Social History   Socioeconomic  History   Marital status: Widowed    Spouse name: Not on file   Number of children: 2   Years of education: Not on file   Highest education level: Not on file  Occupational History   Occupation: Retired  Tobacco Use   Smoking status: Former    Packs/day: 2.00    Years: 30.00    Pack years: 60.00    Types: Cigarettes    Quit date: 03/25/2017    Years since quitting: 4.8   Smokeless tobacco: Never  Vaping Use   Vaping Use: Never used  Substance and Sexual Activity   Alcohol use: Yes    Comment: 3-6 shots of liquor daily   Drug use: Not Currently    Types: Marijuana    Comment: last used about 10 years ago (08/23/19)   Sexual activity: Not Currently  Other Topics Concern   Not on file  Social History Narrative   Per wife, pt was preparing to retire  soon from his job at [employer].   2021-05-25 - wife passed away a couple weeks ago - he lives alone, but has lot of family nearby   Son next door, daughter lives in [place] Resource Strain: Low Risk    Difficulty of Paying Living Expenses: Not hard at all  Food Insecurity: No Food Insecurity   Worried About Charity fundraiser in the Last Year: Never true   Arboriculturist in the Last Year: Never true  Transportation Needs: No Transportation Needs   Lack of Transportation (Medical): No   Lack of Transportation (Non-Medical): No  Physical Activity: Insufficiently Active   Days of Exercise per Week: 5 days   Minutes of Exercise per Session: 20 min  Stress: Stress Concern Present   Feeling of Stress : Rather much  Social Connections: Socially Isolated   Frequency of Communication with Friends and Family: More than three times a week   Frequency of Social Gatherings with Friends and Family: More than three times a week   Attends Religious Services: Never   Marine scientist or Organizations: No   Attends Archivist Meetings: Never   Marital Status: Widowed  Human resources officer Violence: Not At Risk   Fear of Current or Ex-Partner: No   Emotionally Abused: No   Physically Abused: No   Sexually Abused: No    FAMILY HISTORY: Family History  Problem Relation Age of Onset   Hypertension Mother    Hypertension Father    Hypertension Brother    Heart disease Maternal Grandfather    Colon cancer Neg Hx     ALLERGIES:  has No Known Allergies.  MEDICATIONS:  Current Outpatient Medications  Medication Sig Dispense Refill   albuterol (VENTOLIN HFA) 108 (90 Base) MCG/ACT inhaler TAKE 2 PUFFS BY MOUTH EVERY 6 HOURS AS NEEDED FOR WHEEZE OR SHORTNESS OF BREATH 8.5 each 2   amLODipine (NORVASC) 5 MG tablet Take 1 tablet (5 mg total) by mouth daily. 90 tablet 1   atorvastatin (LIPITOR) 40 MG tablet TAKE 1 TABLET BY MOUTH DAILY AT 6 PM. 90  tablet 1   clopidogrel (PLAVIX) 75 MG tablet Take 1 tablet (75 mg total) by mouth daily. 90 tablet 1   Coenzyme Q10 (COQ-10) 100 MG CAPS Take 100 mg by mouth daily.     dapagliflozin propanediol (FARXIGA) 10 MG TABS tablet Take 1 tablet (10 mg total) by mouth daily before breakfast. 90 tablet 5  empagliflozin (JARDIANCE) 10 MG TABS tablet Take 10 mg by mouth daily.     hydrALAZINE (APRESOLINE) 50 MG tablet Take 1 tablet (50 mg total) by mouth every 8 (eight) hours. (Patient taking differently: Take 50 mg by mouth in the morning and at bedtime.) 270 tablet 1   hydrochlorothiazide (HYDRODIURIL) 12.5 MG tablet Take 1 tablet (12.5 mg total) by mouth daily. (NEEDS TO BE SEEN BEFORE NEXT REFILL) 90 tablet 0   isosorbide mononitrate (IMDUR) 30 MG 24 hr tablet TAKE 1/2 OF A TABLET (15 MG TOTAL) BY MOUTH DAILY 45 tablet 0   losartan (COZAAR) 100 MG tablet TAKE 1 TABLET BY MOUTH EVERY DAY 90 tablet 0   metoprolol tartrate (LOPRESSOR) 50 MG tablet Take 1 tablet (50 mg total) by mouth 2 (two) times daily. 180 tablet 1   mirtazapine (REMERON) 7.5 MG tablet TAKE 1 TABLET BY MOUTH EVERYDAY AT BEDTIME 90 tablet 0   nitroGLYCERIN (NITROSTAT) 0.4 MG SL tablet DISSOLVE 1 TAB UNDER TONGUE AT ONSET OF CHEST PAIN. REPEAT EVERY 5 MINUTES FOR 2 DOSES AS NEEDED 25 tablet 2   polyethylene glycol-electrolytes (NULYTELY) 420 g solution As directed 4000 mL 0   prednisoLONE acetate (PRED FORTE) 1 % ophthalmic suspension Place 1 drop into both eyes daily.     RESTASIS 0.05 % ophthalmic emulsion Place 1 drop into both eyes daily.     Simethicone (GAS-X PO) Take 1 tablet by mouth daily.     No current facility-administered medications for this visit.    REVIEW OF SYSTEMS:   Review of Systems  Constitutional:  Positive for appetite change, fatigue and unexpected weight change (-10 lbs).  HENT:   Positive for nosebleeds (x1).   Respiratory:  Positive for shortness of breath.   Gastrointestinal:  Positive for diarrhea and  nausea. Negative for blood in stool.  Genitourinary:  Negative for hematuria.   Skin:  Negative for rash.  Neurological:  Positive for dizziness.  Hematological:  Bruises/bleeds easily.  Psychiatric/Behavioral:  Positive for depression. The patient is nervous/anxious.   All other systems reviewed and are negative.   PHYSICAL EXAMINATION: ECOG PERFORMANCE STATUS: 1 - Symptomatic but completely ambulatory  Vitals:   01/29/22 1129  BP: (!) 146/77  Pulse: 63  Resp: 20  Temp: 98 F (36.7 C)  SpO2: 95%   Filed Weights   01/29/22 1129  Weight: 196 lb 6.4 oz (89.1 kg)   Physical Exam Vitals reviewed.  Constitutional:      Appearance: Normal appearance.  Cardiovascular:     Rate and Rhythm: Normal rate and regular rhythm.     Pulses: Normal pulses.     Heart sounds: Normal heart sounds.  Pulmonary:     Effort: Pulmonary effort is normal.     Breath sounds: Normal breath sounds.  Abdominal:     Palpations: Abdomen is soft. There is no hepatomegaly, splenomegaly or mass.     Tenderness: There is no abdominal tenderness.  Lymphadenopathy:     Cervical: No cervical adenopathy.     Right cervical: No superficial cervical adenopathy.    Left cervical: No superficial cervical adenopathy.     Upper Body:     Right upper body: No supraclavicular or axillary adenopathy.     Left upper body: No supraclavicular or axillary adenopathy.     Lower Body: No right inguinal adenopathy. No left inguinal adenopathy.  Neurological:     General: No focal deficit present.     Mental Status: He is alert and oriented  to person, place, and time.  Psychiatric:        Mood and Affect: Mood normal.        Behavior: Behavior normal.     LABORATORY DATA:  I have reviewed the data as listed Recent Results (from the past 2160 hour(s))  Bayer DCA Hb A1c Waived     Status: None   Collection Time: 12/10/21  1:27 PM  Result Value Ref Range   HB A1C (BAYER DCA - WAIVED) 5.6 4.8 - 5.6 %    Comment:           Prediabetes: 5.7 - 6.4          Diabetes: >6.4          Glycemic control for adults with diabetes: <7.0   Lipid panel     Status: None   Collection Time: 12/10/21  1:31 PM  Result Value Ref Range   Cholesterol, Total 135 100 - 199 mg/dL   Triglycerides 79 0 - 149 mg/dL   HDL 77 >39 mg/dL   VLDL Cholesterol Cal 15 5 - 40 mg/dL   LDL Chol Calc (NIH) 43 0 - 99 mg/dL   Chol/HDL Ratio 1.8 0.0 - 5.0 ratio    Comment:                                   T. Chol/HDL Ratio                                             Men  Women                               1/2 Avg.Risk  3.4    3.3                                   Avg.Risk  5.0    4.4                                2X Avg.Risk  9.6    7.1                                3X Avg.Risk 23.4   11.0   CBC with Differential/Platelet     Status: Abnormal   Collection Time: 12/10/21  1:31 PM  Result Value Ref Range   WBC 4.0 3.4 - 10.8 x10E3/uL   RBC 4.43 4.14 - 5.80 x10E6/uL   Hemoglobin 13.7 13.0 - 17.7 g/dL   Hematocrit 39.4 37.5 - 51.0 %   MCV 89 79 - 97 fL   MCH 30.9 26.6 - 33.0 pg   MCHC 34.8 31.5 - 35.7 g/dL   RDW 14.4 11.6 - 15.4 %   Platelets 94 (LL) 150 - 450 x10E3/uL    Comment: Actual platelet count may be somewhat higher than reported due to aggregation of platelets in this sample.    Neutrophils 64 Not Estab. %   Lymphs 22 Not Estab. %   Monocytes 9 Not Estab. %   Eos 3 Not Estab. %  Basos 1 Not Estab. %   Neutrophils Absolute 2.6 1.4 - 7.0 x10E3/uL   Lymphocytes Absolute 0.9 0.7 - 3.1 x10E3/uL   Monocytes Absolute 0.4 0.1 - 0.9 x10E3/uL   EOS (ABSOLUTE) 0.1 0.0 - 0.4 x10E3/uL   Basophils Absolute 0.0 0.0 - 0.2 x10E3/uL   Immature Granulocytes 1 Not Estab. %   Immature Grans (Abs) 0.0 0.0 - 0.1 x10E3/uL   Hematology Comments: Note:     Comment: Verified by microscopic examination.  CMP14+EGFR     Status: Abnormal   Collection Time: 12/10/21  1:31 PM  Result Value Ref Range   Glucose 120 (H) 70 - 99 mg/dL   BUN 17  8 - 27 mg/dL   Creatinine, Ser 1.23 0.76 - 1.27 mg/dL   eGFR 65 >59 mL/min/1.73   BUN/Creatinine Ratio 14 10 - 24   Sodium 143 134 - 144 mmol/L   Potassium 4.2 3.5 - 5.2 mmol/L   Chloride 105 96 - 106 mmol/L   CO2 19 (L) 20 - 29 mmol/L   Calcium 8.8 8.6 - 10.2 mg/dL   Total Protein 6.3 6.0 - 8.5 g/dL   Albumin 3.8 3.8 - 4.8 g/dL   Globulin, Total 2.5 1.5 - 4.5 g/dL   Albumin/Globulin Ratio 1.5 1.2 - 2.2   Bilirubin Total 0.5 0.0 - 1.2 mg/dL   Alkaline Phosphatase 76 44 - 121 IU/L   AST 132 (H) 0 - 40 IU/L   ALT 65 (H) 0 - 44 IU/L  Microalbumin / creatinine urine ratio     Status: Abnormal   Collection Time: 12/10/21  2:02 PM  Result Value Ref Range   Creatinine, Urine 145.9 Not Estab. mg/dL   Microalbumin, Urine 909.3 Not Estab. ug/mL    Comment: Results confirmed on dilution.    Microalb/Creat Ratio 623 (H) 0 - 29 mg/g creat    Comment:                        Normal:                0 -  29                        Moderately increased: 30 - 300                        Severely increased:       >300   Glucose, capillary     Status: Abnormal   Collection Time: 01/14/22  9:53 AM  Result Value Ref Range   Glucose-Capillary 163 (H) 70 - 99 mg/dL    Comment: Glucose reference range applies only to samples taken after fasting for at least 8 hours.  Surgical pathology     Status: None   Collection Time: 01/14/22 10:33 AM  Result Value Ref Range   SURGICAL PATHOLOGY      SURGICAL PATHOLOGY CASE: [id number] PATIENT: Marlana Salvage Surgical Pathology Report     Clinical History: hx colon polyps     FINAL MICROSCOPIC DIAGNOSIS:  A. COLON, DESCENDING, POLYPECTOMY: - Hyperplastic polyp, 1 fragment.  No dysplasia or malignancy.  GROSS DESCRIPTION:  Received in formalin is a tan, soft tissue fragment that is submitted in toto.  Size: 0.2 x 0.1 x 0.1 cm, 1 block submitted. Amparo Bristol, 01/14/2022)   Final Diagnosis performed by Mark Martinique, MD.   Electronically  signed 01/15/2022 Technical component performed at [facility], [address] [place], [place] [address].  Professional component performed at [employer] [facility]. [facility], [place] [address] [place], [place] [address].  Immunohistochemistry Technical component (if applicable) was performed at [facility]. [address], [place], [place], [place] [address].   IMMUNOHISTOCHEMISTRY DISCLAIMER (if applicable): Some of these immunohistochemica l stains may have been developed and the performance characteristics determine by [facility]. Some may not have been cleared or approved by the U.S. Food and Drug Administration. The FDA has determined that such clearance or approval is not necessary. This test is used for clinical purposes. It should not be regarded as investigational or for research. This laboratory is certified under the [place] (CLIA-88) as qualified to perform high complexity clinical laboratory testing.  The controls stained appropriately.   Lactate dehydrogenase     Status: Abnormal   Collection Time: 01/29/22 12:15 PM  Result Value Ref Range   LDH 196 (H) 98 - 192 U/L    Comment: Performed at [facility], [address], [place], [place] [address]  Reticulocytes     Status: Abnormal   Collection Time: 01/29/22 12:15 PM  Result Value Ref Range   Retic Ct Pct 2.3 0.4 - 3.1 %   RBC. 4.13 (L) 4.22 - 5.81 MIL/uL   Retic Count, Absolute 95.0 19.0 - 186.0 K/uL   Immature Retic Fract 26.7 (H) 2.3 - 15.9 %    Comment: Performed at [facility], [address] [place], [place] [address]  CBC with Differential     Status: Abnormal   Collection Time: 01/29/22 12:15 PM  Result Value Ref Range   WBC 5.5 4.0 - 10.5 K/uL   RBC 4.16 (L) 4.22 - 5.81 MIL/uL   Hemoglobin 12.8 (L) 13.0 - 17.0 g/dL   HCT 40.6 39.0 - 52.0 %   MCV 97.6 80.0 - 100.0 fL   MCH 30.8 26.0 - 34.0 pg   MCHC 31.5 30.0 -  36.0 g/dL   RDW 15.8 (H) 11.5 - 15.5 %   Platelets 122 (L) 150 - 400 K/uL   nRBC 0.0 0.0 - 0.2 %   Neutrophils Relative % 78 %   Neutro Abs 4.3 1.7 - 7.7 K/uL   Lymphocytes Relative 13 %   Lymphs Abs 0.7 0.7 - 4.0 K/uL   Monocytes Relative 6 %   Monocytes Absolute 0.3 0.1 - 1.0 K/uL   Eosinophils Relative 1 %   Eosinophils Absolute 0.0 0.0 - 0.5 K/uL   Basophils Relative 1 %   Basophils Absolute 0.0 0.0 - 0.1 K/uL   Immature Granulocytes 1 %   Abs Immature Granulocytes 0.05 0.00 - 0.07 K/uL    Comment: Performed at [facility], [address] [place], [place] [address]    RADIOGRAPHIC STUDIES: I have personally reviewed the radiological images as listed and agreed with the findings in the report. No results found.  ASSESSMENT:  Thrombocytopenia: - He had mild thrombocytopenia since 2018.  CBC on 12/10/2021 showed platelet count 94. - Has easy bruising on the forearms since he was started on Plavix in 2018 after MI and stents.  He had 1 nosebleed 1 month ago which lasted about 10 minutes.  No bleeding per rectum or melena. - Abdominal ultrasound (08/28/2019): Fatty liver with normal spleen. - No B symptoms or infections. - Patient reports 10 pound weight loss in the last 6 months due to decreased appetite.  He also reported occasional bloating of the abdomen with diarrhea  after eating and abdominal tightness.   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  Quit smoking in 2018 after he had a heart attack.  He smoked 2 packs/day for 50 years. - He drinks 1 pint of whiskey daily. - No family history of thrombocytopenia.  Brother had multiple myeloma.   PLAN:  Thrombocytopenia: - We have discussed differential diagnosis of mild to moderate thrombocytopenia.  In this patient differential diagnosis includes EtOH induced thrombocytopenia, immune mediated thrombocytopenia. - We will repeat platelet count today and check for nutritional  deficiencies, connective tissue disorders and infectious etiologies. - Because of his abdominal symptoms and drinking history, will order CT of the abdomen with contrast. - RTC 2 weeks for follow-up.   All questions were answered. The patient knows to call the clinic with any problems, questions or concerns.  Derek Jack, MD 01/29/22 1:45 PM  [place] [phone]   I, Thana Ates, am acting as a scribe for Dr. Derek Jack.  I, Derek Jack MD, have reviewed the above documentation for accuracy and completeness, and I agree with the above.

## 2022-01-30 LAB — RHEUMATOID FACTOR: Rheumatoid fact SerPl-aCnc: 17 IU/mL — ABNORMAL HIGH (ref <14.0)

## 2022-01-31 LAB — COPPER, SERUM: Copper: 84 ug/dL (ref 69–132)

## 2022-02-02 LAB — PROTEIN ELECTROPHORESIS, SERUM
A/G Ratio: 1.4 (ref 0.7–1.7)
Albumin ELP: 3.9 g/dL (ref 2.9–4.4)
Alpha-1-Globulin: 0.2 g/dL (ref 0.0–0.4)
Alpha-2-Globulin: 0.7 g/dL (ref 0.4–1.0)
Beta Globulin: 0.8 g/dL (ref 0.7–1.3)
Gamma Globulin: 1.1 g/dL (ref 0.4–1.8)
Globulin, Total: 2.8 g/dL (ref 2.2–3.9)
Total Protein ELP: 6.7 g/dL (ref 6.0–8.5)

## 2022-02-03 LAB — H PYLORI, IGM, IGG, IGA AB
H Pylori IgG: 0.11 Index Value (ref 0.00–0.79)
H. Pylogi, Iga Abs: <9 units (ref 0.0–8.9)
H. Pylogi, Igm Abs: <9 units (ref 0.0–8.9)

## 2022-02-03 LAB — ANTINUCLEAR ANTIBODIES, IFA: ANA Ab, IFA: NEGATIVE

## 2022-02-04 LAB — METHYLMALONIC ACID, SERUM: Methylmalonic Acid, Quantitative: 123 nmol/L (ref 0–378)

## 2022-02-09 ENCOUNTER — Encounter (HOSPITAL_COMMUNITY): Payer: Self-pay

## 2022-02-09 ENCOUNTER — Ambulatory Visit (HOSPITAL_BASED_OUTPATIENT_CLINIC_OR_DEPARTMENT_OTHER)
Admission: RE | Admit: 2022-02-09 | Discharge: 2022-02-09 | Disposition: A | Discharge status: Home / Self Care | Payer: Medicare Other | Source: Ambulatory Visit | Attending: Hematology | Admitting: Hematology

## 2022-02-09 ENCOUNTER — Other Ambulatory Visit: Payer: Self-pay

## 2022-02-09 ENCOUNTER — Encounter (HOSPITAL_BASED_OUTPATIENT_CLINIC_OR_DEPARTMENT_OTHER): Payer: Self-pay

## 2022-02-09 DIAGNOSIS — E875 Hyperkalemia: Secondary | ICD-10-CM | POA: Diagnosis not present

## 2022-02-09 DIAGNOSIS — Z955 Presence of coronary angioplasty implant and graft: Secondary | ICD-10-CM | POA: Diagnosis not present

## 2022-02-09 DIAGNOSIS — R634 Abnormal weight loss: Secondary | ICD-10-CM | POA: Insufficient documentation

## 2022-02-09 DIAGNOSIS — R443 Hallucinations, unspecified: Secondary | ICD-10-CM | POA: Insufficient documentation

## 2022-02-09 DIAGNOSIS — N1831 Chronic kidney disease, stage 3a: Secondary | ICD-10-CM | POA: Diagnosis not present

## 2022-02-09 DIAGNOSIS — Z87891 Personal history of nicotine dependence: Secondary | ICD-10-CM | POA: Diagnosis not present

## 2022-02-09 DIAGNOSIS — Z96641 Presence of right artificial hip joint: Secondary | ICD-10-CM | POA: Insufficient documentation

## 2022-02-09 DIAGNOSIS — D696 Thrombocytopenia, unspecified: Secondary | ICD-10-CM | POA: Insufficient documentation

## 2022-02-09 DIAGNOSIS — Z7984 Long term (current) use of oral hypoglycemic drugs: Secondary | ICD-10-CM | POA: Diagnosis not present

## 2022-02-09 DIAGNOSIS — Z79899 Other long term (current) drug therapy: Secondary | ICD-10-CM | POA: Diagnosis not present

## 2022-02-09 DIAGNOSIS — I2510 Atherosclerotic heart disease of native coronary artery without angina pectoris: Secondary | ICD-10-CM | POA: Diagnosis not present

## 2022-02-09 DIAGNOSIS — Z96652 Presence of left artificial knee joint: Secondary | ICD-10-CM | POA: Diagnosis not present

## 2022-02-09 DIAGNOSIS — E860 Dehydration: Secondary | ICD-10-CM | POA: Insufficient documentation

## 2022-02-09 DIAGNOSIS — N179 Acute kidney failure, unspecified: Secondary | ICD-10-CM | POA: Diagnosis not present

## 2022-02-09 DIAGNOSIS — I129 Hypertensive chronic kidney disease with stage 1 through stage 4 chronic kidney disease, or unspecified chronic kidney disease: Secondary | ICD-10-CM | POA: Insufficient documentation

## 2022-02-09 DIAGNOSIS — R112 Nausea with vomiting, unspecified: Secondary | ICD-10-CM | POA: Diagnosis present

## 2022-02-09 DIAGNOSIS — R7401 Elevation of levels of liver transaminase levels: Secondary | ICD-10-CM | POA: Insufficient documentation

## 2022-02-09 DIAGNOSIS — Z85828 Personal history of other malignant neoplasm of skin: Secondary | ICD-10-CM | POA: Diagnosis not present

## 2022-02-09 DIAGNOSIS — Z7902 Long term (current) use of antithrombotics/antiplatelets: Secondary | ICD-10-CM | POA: Diagnosis not present

## 2022-02-09 DIAGNOSIS — E1165 Type 2 diabetes mellitus with hyperglycemia: Secondary | ICD-10-CM | POA: Diagnosis not present

## 2022-02-09 LAB — COMPREHENSIVE METABOLIC PANEL
ALT: 51 U/L — ABNORMAL HIGH (ref 0–44)
AST: 100 U/L — ABNORMAL HIGH (ref 15–41)
Albumin: 4.1 g/dL (ref 3.5–5.0)
Alkaline Phosphatase: 54 U/L (ref 38–126)
Anion gap: 13 (ref 5–15)
BUN: 40 mg/dL — ABNORMAL HIGH (ref 8–23)
CO2: 20 mmol/L — ABNORMAL LOW (ref 22–32)
Calcium: 8.8 mg/dL — ABNORMAL LOW (ref 8.9–10.3)
Chloride: 102 mmol/L (ref 98–111)
Creatinine, Ser: 1.91 mg/dL — ABNORMAL HIGH (ref 0.61–1.24)
GFR, Estimated: 38 mL/min — ABNORMAL LOW (ref >60)
Glucose, Bld: 225 mg/dL — ABNORMAL HIGH (ref 70–99)
Potassium: 5.6 mmol/L — ABNORMAL HIGH (ref 3.5–5.1)
Sodium: 135 mmol/L (ref 135–145)
Total Bilirubin: 1.2 mg/dL (ref 0.3–1.2)
Total Protein: 7.2 g/dL (ref 6.5–8.1)

## 2022-02-09 LAB — CBC
HCT: 38.5 % — ABNORMAL LOW (ref 39.0–52.0)
Hemoglobin: 11.9 g/dL — ABNORMAL LOW (ref 13.0–17.0)
MCH: 30.8 pg (ref 26.0–34.0)
MCHC: 30.9 g/dL (ref 30.0–36.0)
MCV: 99.7 fL (ref 80.0–100.0)
Platelets: 78 10*3/uL — ABNORMAL LOW (ref 150–400)
RBC: 3.86 MIL/uL — ABNORMAL LOW (ref 4.22–5.81)
RDW: 15.6 % — ABNORMAL HIGH (ref 11.5–15.5)
WBC: 4.7 10*3/uL (ref 4.0–10.5)
nRBC: 0 % (ref 0.0–0.2)

## 2022-02-09 LAB — LIPASE, BLOOD: Lipase: 94 U/L — ABNORMAL HIGH (ref 11–51)

## 2022-02-09 LAB — POCT I-STAT CREATININE: Creatinine, Ser: 2.1 mg/dL — ABNORMAL HIGH (ref 0.61–1.24)

## 2022-02-09 MED ORDER — IOHEXOL 300 MG/ML  SOLN
60.0000 mL | Freq: Once | INTRAMUSCULAR | Status: AC | PRN
  Administered 2022-02-09: 60 mL via INTRAVENOUS

## 2022-02-09 NOTE — ED Triage Notes (Addendum)
Pt was seen at [facility] for a CT today and has been sick since drinking the contrast. Pt has not been able to keep anything down and has been weak and dizzy. Also states he feels like his abdomen is distended

## 2022-02-10 ENCOUNTER — Emergency Department (HOSPITAL_COMMUNITY): Payer: Medicare Other

## 2022-02-10 ENCOUNTER — Other Ambulatory Visit: Payer: Self-pay

## 2022-02-10 ENCOUNTER — Observation Stay (HOSPITAL_COMMUNITY): Payer: Medicare Other

## 2022-02-10 ENCOUNTER — Observation Stay (HOSPITAL_COMMUNITY)
Admission: EM | Admit: 2022-02-10 | Discharge: 2022-02-11 | Disposition: A | Discharge status: Home / Self Care | Payer: Medicare Other | Attending: Internal Medicine | Admitting: Internal Medicine

## 2022-02-10 ENCOUNTER — Encounter (HOSPITAL_COMMUNITY): Payer: Self-pay | Admitting: Internal Medicine

## 2022-02-10 DIAGNOSIS — R748 Abnormal levels of other serum enzymes: Secondary | ICD-10-CM

## 2022-02-10 DIAGNOSIS — I2510 Atherosclerotic heart disease of native coronary artery without angina pectoris: Secondary | ICD-10-CM | POA: Diagnosis present

## 2022-02-10 DIAGNOSIS — E860 Dehydration: Secondary | ICD-10-CM

## 2022-02-10 DIAGNOSIS — D696 Thrombocytopenia, unspecified: Secondary | ICD-10-CM | POA: Diagnosis present

## 2022-02-10 DIAGNOSIS — E1169 Type 2 diabetes mellitus with other specified complication: Secondary | ICD-10-CM

## 2022-02-10 DIAGNOSIS — I10 Essential (primary) hypertension: Secondary | ICD-10-CM | POA: Diagnosis present

## 2022-02-10 DIAGNOSIS — E782 Mixed hyperlipidemia: Secondary | ICD-10-CM

## 2022-02-10 DIAGNOSIS — F1010 Alcohol abuse, uncomplicated: Secondary | ICD-10-CM

## 2022-02-10 DIAGNOSIS — N1831 Chronic kidney disease, stage 3a: Secondary | ICD-10-CM

## 2022-02-10 DIAGNOSIS — R443 Hallucinations, unspecified: Secondary | ICD-10-CM

## 2022-02-10 DIAGNOSIS — R112 Nausea with vomiting, unspecified: Secondary | ICD-10-CM

## 2022-02-10 DIAGNOSIS — E875 Hyperkalemia: Secondary | ICD-10-CM

## 2022-02-10 DIAGNOSIS — N179 Acute kidney failure, unspecified: Secondary | ICD-10-CM | POA: Diagnosis not present

## 2022-02-10 DIAGNOSIS — R197 Diarrhea, unspecified: Secondary | ICD-10-CM | POA: Diagnosis not present

## 2022-02-10 DIAGNOSIS — R7401 Elevation of levels of liver transaminase levels: Secondary | ICD-10-CM

## 2022-02-10 DIAGNOSIS — E1159 Type 2 diabetes mellitus with other circulatory complications: Secondary | ICD-10-CM | POA: Diagnosis present

## 2022-02-10 DIAGNOSIS — E1165 Type 2 diabetes mellitus with hyperglycemia: Secondary | ICD-10-CM

## 2022-02-10 LAB — HEMOGLOBIN A1C
Hgb A1c MFr Bld: 6 % — ABNORMAL HIGH (ref 4.8–5.6)
Mean Plasma Glucose: 125.5 mg/dL

## 2022-02-10 LAB — CBG MONITORING, ED: Glucose-Capillary: 192 mg/dL — ABNORMAL HIGH (ref 70–99)

## 2022-02-10 LAB — URINALYSIS, ROUTINE W REFLEX MICROSCOPIC
Bacteria, UA: NONE SEEN
Bilirubin Urine: NEGATIVE
Glucose, UA: >=500 mg/dL — AB
Hgb urine dipstick: NEGATIVE
Ketones, ur: 5 mg/dL — AB
Leukocytes,Ua: NEGATIVE
Nitrite: NEGATIVE
Protein, ur: 100 mg/dL — AB
Specific Gravity, Urine: 1.036 — ABNORMAL HIGH (ref 1.005–1.030)
pH: 5 (ref 5.0–8.0)

## 2022-02-10 LAB — GLUCOSE, CAPILLARY
Glucose-Capillary: 125 mg/dL — ABNORMAL HIGH (ref 70–99)
Glucose-Capillary: 148 mg/dL — ABNORMAL HIGH (ref 70–99)
Glucose-Capillary: 154 mg/dL — ABNORMAL HIGH (ref 70–99)
Glucose-Capillary: 105 mg/dL — ABNORMAL HIGH (ref 70–99)

## 2022-02-10 LAB — C DIFFICILE QUICK SCREEN W PCR REFLEX
C Diff antigen: NEGATIVE
C Diff interpretation: NOT DETECTED
C Diff toxin: NEGATIVE

## 2022-02-10 LAB — PHOSPHORUS: Phosphorus: 2.5 mg/dL (ref 2.5–4.6)

## 2022-02-10 LAB — CBC
HCT: 34 % — ABNORMAL LOW (ref 39.0–52.0)
Hemoglobin: 11.1 g/dL — ABNORMAL LOW (ref 13.0–17.0)
MCH: 31.4 pg (ref 26.0–34.0)
MCHC: 32.6 g/dL (ref 30.0–36.0)
MCV: 96.3 fL (ref 80.0–100.0)
Platelets: 54 10*3/uL — ABNORMAL LOW (ref 150–400)
RBC: 3.53 MIL/uL — ABNORMAL LOW (ref 4.22–5.81)
RDW: 15.4 % (ref 11.5–15.5)
WBC: 3 10*3/uL — ABNORMAL LOW (ref 4.0–10.5)
nRBC: 0 % (ref 0.0–0.2)

## 2022-02-10 LAB — BASIC METABOLIC PANEL
Anion gap: 7 (ref 5–15)
BUN: 41 mg/dL — ABNORMAL HIGH (ref 8–23)
CO2: 20 mmol/L — ABNORMAL LOW (ref 22–32)
Calcium: 8.5 mg/dL — ABNORMAL LOW (ref 8.9–10.3)
Chloride: 108 mmol/L (ref 98–111)
Creatinine, Ser: 1.76 mg/dL — ABNORMAL HIGH (ref 0.61–1.24)
GFR, Estimated: 42 mL/min — ABNORMAL LOW (ref >60)
Glucose, Bld: 141 mg/dL — ABNORMAL HIGH (ref 70–99)
Potassium: 4.3 mmol/L (ref 3.5–5.1)
Sodium: 135 mmol/L (ref 135–145)

## 2022-02-10 LAB — HIV ANTIBODY (ROUTINE TESTING W REFLEX): HIV Screen 4th Generation wRfx: NONREACTIVE

## 2022-02-10 LAB — MAGNESIUM: Magnesium: 1.4 mg/dL — ABNORMAL LOW (ref 1.7–2.4)

## 2022-02-10 MED ORDER — NITROGLYCERIN 0.4 MG SL SUBL: 0.4000 mg | SUBLINGUAL_TABLET | SUBLINGUAL | Status: DC | PRN

## 2022-02-10 MED ORDER — INSULIN ASPART 100 UNIT/ML IJ SOLN: 0.0000 [IU] | Freq: Every day | INTRAMUSCULAR | Status: DC

## 2022-02-10 MED ORDER — SODIUM CHLORIDE 0.9 % IV BOLUS
1000.0000 mL | Freq: Once | INTRAVENOUS | Status: AC
  Administered 2022-02-10: 1000 mL via INTRAVENOUS

## 2022-02-10 MED ORDER — ONDANSETRON HCL 4 MG/2ML IJ SOLN
4.0000 mg | Freq: Once | INTRAMUSCULAR | Status: AC
  Administered 2022-02-10: 4 mg via INTRAVENOUS
  Filled 2022-02-10: qty 2

## 2022-02-10 MED ORDER — ONDANSETRON HCL 4 MG/2ML IJ SOLN: 4.0000 mg | Freq: Four times a day (QID) | INTRAMUSCULAR | Status: DC | PRN

## 2022-02-10 MED ORDER — PHENYLEPHRINE-MINERAL OIL-PET 0.25-14-74.9 % RE OINT
1.0000 "application " | TOPICAL_OINTMENT | Freq: Two times a day (BID) | RECTAL | Status: DC | PRN
  Administered 2022-02-10: 1 via RECTAL

## 2022-02-10 MED ORDER — PANTOPRAZOLE SODIUM 40 MG IV SOLR
40.0000 mg | Freq: Two times a day (BID) | INTRAVENOUS | Status: DC
  Administered 2022-02-10 – 2022-02-11 (×3): 40 mg via INTRAVENOUS
  Filled 2022-02-10 (×3): qty 10

## 2022-02-10 MED ORDER — CLOPIDOGREL BISULFATE 75 MG PO TABS
75.0000 mg | ORAL_TABLET | Freq: Every day | ORAL | Status: DC
  Administered 2022-02-10 – 2022-02-11 (×2): 75 mg via ORAL
  Filled 2022-02-10 (×2): qty 1

## 2022-02-10 MED ORDER — METOPROLOL TARTRATE 50 MG PO TABS
50.0000 mg | ORAL_TABLET | Freq: Two times a day (BID) | ORAL | Status: DC
  Administered 2022-02-10 – 2022-02-11 (×3): 50 mg via ORAL
  Filled 2022-02-10 (×3): qty 1

## 2022-02-10 MED ORDER — SODIUM CHLORIDE 0.9 % IV SOLN: INTRAVENOUS | Status: AC

## 2022-02-10 MED ORDER — AMLODIPINE BESYLATE 5 MG PO TABS
5.0000 mg | ORAL_TABLET | Freq: Every day | ORAL | Status: DC
  Administered 2022-02-10 – 2022-02-11 (×2): 5 mg via ORAL
  Filled 2022-02-10 (×2): qty 1

## 2022-02-10 MED ORDER — LOSARTAN POTASSIUM 50 MG PO TABS
100.0000 mg | ORAL_TABLET | Freq: Every day | ORAL | Status: DC
  Administered 2022-02-10: 100 mg via ORAL
  Filled 2022-02-10: qty 2

## 2022-02-10 MED ORDER — ENOXAPARIN SODIUM 40 MG/0.4ML IJ SOSY
40.0000 mg | PREFILLED_SYRINGE | INTRAMUSCULAR | Status: DC
  Administered 2022-02-10 – 2022-02-11 (×2): 40 mg via SUBCUTANEOUS
  Filled 2022-02-10 (×2): qty 0.4

## 2022-02-10 MED ORDER — PHENYLEPHRINE IN HARD FAT 0.25 % RE SUPP
1.0000 | Freq: Two times a day (BID) | RECTAL | Status: DC | PRN
Start: 2022-02-10 — End: 2022-02-10

## 2022-02-10 MED ORDER — ISOSORBIDE MONONITRATE ER 60 MG PO TB24
30.0000 mg | ORAL_TABLET | Freq: Every day | ORAL | Status: DC
  Administered 2022-02-10 – 2022-02-11 (×2): 30 mg via ORAL
  Filled 2022-02-10 (×2): qty 1

## 2022-02-10 MED ORDER — INSULIN GLARGINE-YFGN 100 UNIT/ML ~~LOC~~ SOLN
10.0000 [IU] | Freq: Every day | SUBCUTANEOUS | Status: DC
  Filled 2022-02-10 (×2): qty 0.1

## 2022-02-10 MED ORDER — INSULIN ASPART 100 UNIT/ML IJ SOLN
0.0000 [IU] | Freq: Three times a day (TID) | INTRAMUSCULAR | Status: DC
  Administered 2022-02-10 (×2): 1 [IU] via SUBCUTANEOUS
  Administered 2022-02-10: 2 [IU] via SUBCUTANEOUS

## 2022-02-10 MED ORDER — SODIUM ZIRCONIUM CYCLOSILICATE 10 G PO PACK
10.0000 g | PACK | Freq: Once | ORAL | Status: AC
  Administered 2022-02-10: 10 g via ORAL
  Filled 2022-02-10: qty 1

## 2022-02-10 MED ORDER — ENSURE ENLIVE PO LIQD
237.0000 mL | Freq: Two times a day (BID) | ORAL | Status: DC
  Administered 2022-02-10 – 2022-02-11 (×3): 237 mL via ORAL

## 2022-02-10 MED ORDER — ATORVASTATIN CALCIUM 40 MG PO TABS
40.0000 mg | ORAL_TABLET | Freq: Every day | ORAL | Status: DC
Start: 2022-02-10 — End: 2022-02-10

## 2022-02-10 NOTE — Assessment & Plan Note (Addendum)
Baseline creatinine 1.2-1.5 Presented with serum creatinine 2.10 Due to volume depletion Continue IVF>>improving -serum creatinine 1.62 on day of d/c

## 2022-02-10 NOTE — Assessment & Plan Note (Signed)
Chronic dating back to July 2018 Acute worsening due to acute medical illness

## 2022-02-10 NOTE — TOC Progression Note (Signed)
  Transition of Care [facility]) Screening Note   Patient Details  Name: [name] Foster Date of Birth: 25-Oct-1955   [facility] of [facility]) CM/SW Contact:    Boneta Lucks, RN Phone Number: 02/10/2022, 12:16 PM    Transition of Care Department [facility]) has reviewed patient and no TOC needs have been identified at this time. We will continue to monitor patient advancement through interdisciplinary progression rounds. If new patient transition needs arise, please place a TOC consult.    Expected Discharge Plan: Home/Self Care Barriers to Discharge: Continued Medical Work up  Expected Discharge Plan and Services Expected Discharge Plan: Home/Self Care

## 2022-02-10 NOTE — Progress Notes (Addendum)
Pt came to hospital after IV contrast did not agree with him. A&OX4 no copletes of pain at this time. BP (!) 172/102 (BP Location: Left Arm)   Pulse (!) 101   Temp 98.2 F (36.8 C)   Resp 18   Ht '5\' 8"'$  (1.727 m)   Wt 89 kg   SpO2 95%   BMI 29.83 kg/m

## 2022-02-10 NOTE — H&P (Signed)
History and Physical    Patient: [name] Foster UJW:[id number] DOB: [birth year] DOA: 02/10/2022 DOS: the patient was seen and examined on 02/10/2022 PCP: [name], FNP  Patient coming from: Home  Chief Complaint: No chief complaint on file.  HPI: [name] Foster is a 66 y.o. male with medical history significant of CAD status post stent placement, T2DM, essential hypertension, hyperlipidemia, pulm hypertension who presents to the emergency department due to 1 month onset of diarrhea,  Diarrhea was nonbloody, but was 6-8 episodes of watery diarrhea daily.  He also complained of several days onset of nausea, vomiting and abdominal discomfort which worsened after CT abdomen with contrast he had yesterday.  Patient also complained of hallucinations which started yesterday.  He denies fever, chills, shortness of breath, chest pain, headache.  ED Course:  In the emergency department, he was intermittently tachycardic, BP was 147/89 and other vital signs are within normal range.  Work-up in the ED showed normocytic anemia, thrombocytopenia.  BMP showed hypokalemia, hyperglycemia, BUN/creatinine 40/1.91 (baseline creatinine at 1.2-1.5).  Lipase 94, urinalysis was positive for glycosuria but was unimpressive for UTI. CT head without contrast showed Atrophy and small-vessel disease with no acute intracranial CT findings. Carotid atherosclerosis. CT abdomen with contrast showed hepatic steatosis.  Otherwise negative CT abdomen Abdominal x-ray showed no acute cardiopulmonary disease. Patient was treated with IV Zofran x1, IV hydration was provided.  Hospitalist was asked to admit patient for further evaluation and management.  Review of Systems: Review of systems as noted in the HPI. All other systems reviewed and are negative.   Past Medical History:  Diagnosis Date   Arthritis    CAD (coronary artery disease)    a. inferoposterior STEMI 03/25/17: LHC LM calcified w/o stenosis, pLAD 50%,  m-dLCx 99% s/p PCI/DES w/ Promus 3.5 x 24 mm DES, ostial OM1 50%, small RCA with mRCA 50%; b. DES to left PDA 09/20/17 // c. 10/19: LCx and LPDA stents ok; s/p DES to pLAD and POBA to D1   Diabetes mellitus without complication ([place])    type 2    Diabetic nephropathy ([place])    Echocardiogram 08/2021    Echocardiogram 12/22: EF 60-65, no RWMA, GLS -19.4% (normal), normal RVSF, trivial MR, mild dilation of ascending aorta (37 mm)   Heart attack ([place])    " a blood clot is actually what caused my heart attack in 2018 " , he states he is unsure where the blood clot originated from    Hyperlipidemia    Hypertension    Insomnia    Pulmonary hypertension ([place])    a. TTE 03/25/17; EF 60-65%, nl WM, LV diastolic fxn nl, trivial AI, RV cavity size, wall thickness, and sys fxn nl, trivial TR, PASP 39 mmHg, trivial pericardial effusion // Echo 12/22: normal RVSP, PASP   SCCA (squamous cell carcinoma) of skin 03/26/2020   Mid Parietal Scalp (well diff)   Sleep apnea    could not use a cpap   Vitamin B12 deficiency 05/03/2020   Wears dentures    full top-partial bottom   Wears glasses    Past Surgical History:  Procedure Laterality Date   COLONOSCOPY  05/13/2010   Single anal papilla, 2 diminutive polyps at 10 cm, left-sided diverticula, one sigmoid and one a sending polyp, otherwise normal colon and TI.  Path with 1 tubular adenoma, otherwise hyperplastic polyps.  Recommendations repeat colonoscopy in 7 years.   COLONOSCOPY WITH PROPOFOL N/A 11/22/2019   Rourk, Cristopher Estimable, MD -  ten 4-9 mm polyps in the rectum, sigmoid colon and cecum were removed, three 12-25 mm polyps at the splenic flexure and hepatic flexure removed with clip placement to the largest polyp   COLONOSCOPY WITH PROPOFOL N/A 01/14/2022   Procedure: COLONOSCOPY WITH PROPOFOL;  Surgeon: Daneil Dolin, MD;  [facility];  Service: Endoscopy;  Laterality: N/A;  11:15am, asa 3   CORONARY ATHERECTOMY N/A 07/03/2018   Procedure:  CORONARY ATHERECTOMY;  Surgeon: Burnell Blanks, MD;  Location: [facility];  Service: Cardiovascular;  Laterality: N/A;   CORONARY STENT INTERVENTION N/A 09/20/2017   Procedure: CORONARY STENT INTERVENTION;  Surgeon: Leonie Man, MD;  Location: [facility];  Service: Cardiovascular;  Laterality: N/A;   CORONARY STENT INTERVENTION N/A 07/03/2018   Procedure: CORONARY STENT INTERVENTION;  Surgeon: Burnell Blanks, MD;  Location: [facility];  Service: Cardiovascular;  Laterality: N/A;   CORONARY/GRAFT ACUTE MI REVASCULARIZATION N/A 03/25/2017   Procedure: Coronary/Graft Acute MI Revascularization;  Surgeon: Sherren Mocha, MD;  Location: [facility];  Service: Cardiovascular;  Laterality: N/A;   GANGLION CYST EXCISION Left 07/25/2014   Procedure: EXCISION VOLAR AND DORSAL GANGLION LEFT WRIST;  Surgeon: Leanora Cover, MD;  Location: [facility];  Service: Orthopedics;  Laterality: Left;   INTRAVASCULAR PRESSURE WIRE/FFR STUDY N/A 09/20/2017   Procedure: INTRAVASCULAR PRESSURE WIRE/FFR STUDY;  Surgeon: Leonie Man, MD;  Location: [facility];  Service: Cardiovascular;  Laterality: N/A;   INTRAVASCULAR PRESSURE WIRE/FFR STUDY N/A 06/30/2018   Procedure: INTRAVASCULAR PRESSURE WIRE/FFR STUDY;  Surgeon: Nelva Bush, MD;  Location: [facility];  Service: Cardiovascular;  Laterality: N/A;   KNEE ARTHROSCOPY     rifgr and left   LEFT HEART CATH AND CORONARY ANGIOGRAPHY N/A 03/25/2017   Procedure: Left Heart Cath and Coronary Angiography;  Surgeon: Sherren Mocha, MD;  Location: [facility];  Service: Cardiovascular;  Laterality: N/A;   LEFT HEART CATH AND CORONARY ANGIOGRAPHY N/A 09/20/2017   Procedure: LEFT HEART CATH AND CORONARY ANGIOGRAPHY;  Surgeon: Leonie Man, MD;  Location: [facility];  Service: Cardiovascular;  Laterality: N/A;   LEFT HEART CATH AND CORONARY ANGIOGRAPHY N/A 06/30/2018    Procedure: LEFT HEART CATH AND CORONARY ANGIOGRAPHY;  Surgeon: Nelva Bush, MD;  Location: [facility];  Service: Cardiovascular;  Laterality: N/A;   POLYPECTOMY  11/22/2019   Procedure: POLYPECTOMY;  Surgeon: Daneil Dolin, MD;  [facility];  Service: Endoscopy;;   POLYPECTOMY  01/14/2022   Procedure: POLYPECTOMY;  Surgeon: Daneil Dolin, MD;  [facility];  Service: Endoscopy;;   SHOULDER ARTHROSCOPY  2012   left   SHOULDER ARTHROSCOPY     right   TEMPORARY PACEMAKER N/A 03/25/2017   Procedure: Temporary Pacemaker;  Surgeon: Sherren Mocha, MD;  Location: [facility];  Service: Cardiovascular;  Laterality: N/A;   TONSILLECTOMY     TOTAL HIP ARTHROPLASTY Right 04/25/2019   TOTAL HIP ARTHROPLASTY Right 04/25/2019   Procedure: TOTAL HIP ARTHROPLASTY ANTERIOR APPROACH;  Surgeon: Rod Can, MD;  Location: [facility];  Service: Orthopedics;  Laterality: Right;   TOTAL KNEE ARTHROPLASTY  2002   left    Social History:  reports that he quit smoking about 4 years ago. His smoking use included cigarettes. He has a 60.00 pack-year smoking history. He has never used smokeless tobacco. He reports current alcohol use. He reports that he does not currently use  drugs after having used the following drugs: Marijuana.   No Known Allergies  Family History  Problem Relation Age of Onset   Hypertension Mother    Hypertension Father    Hypertension Brother    Heart disease Maternal Grandfather    Colon cancer Neg Hx      Prior to Admission medications   Medication Sig Start Date End Date Taking? Authorizing Provider  albuterol (VENTOLIN HFA) 108 (90 Base) MCG/ACT inhaler TAKE 2 PUFFS BY MOUTH EVERY 6 HOURS AS NEEDED FOR WHEEZE OR SHORTNESS OF BREATH 12/23/21  Yes [name] F, FNP  amLODipine (NORVASC) 5 MG tablet Take 1 tablet (5 mg total) by mouth daily. 12/10/21  Yes [name] F, FNP  atorvastatin (LIPITOR) 40 MG tablet TAKE 1 TABLET BY MOUTH  DAILY AT 6 PM. 12/17/21  Yes [name] F, FNP  clopidogrel (PLAVIX) 75 MG tablet Take 1 tablet (75 mg total) by mouth daily. 12/10/21  Yes [name] F, FNP  Coenzyme Q10 (COQ-10) 100 MG CAPS Take 100 mg by mouth daily.   Yes [provider]  empagliflozin (JARDIANCE) 10 MG TABS tablet Take 10 mg by mouth daily.   Yes [provider]  hydrochlorothiazide (HYDRODIURIL) 12.5 MG tablet Take 1 tablet (12.5 mg total) by mouth daily. (NEEDS TO BE SEEN BEFORE NEXT REFILL) 11/13/21  Yes [name] F, FNP  isosorbide mononitrate (IMDUR) 30 MG 24 hr tablet TAKE 1/2 OF A TABLET (15 MG TOTAL) BY MOUTH DAILY 01/25/22  Yes [name] F, FNP  losartan (COZAAR) 100 MG tablet TAKE 1 TABLET BY MOUTH EVERY DAY 11/24/21  Yes [name], FNP  metoprolol tartrate (LOPRESSOR) 50 MG tablet Take 1 tablet (50 mg total) by mouth 2 (two) times daily. 12/10/21  Yes [name] F, FNP  mirtazapine (REMERON) 7.5 MG tablet TAKE 1 TABLET BY MOUTH EVERYDAY AT BEDTIME 12/02/21  Yes [name] F, FNP  prednisoLONE acetate (PRED FORTE) 1 % ophthalmic suspension Place 1 drop into both eyes daily. 12/11/21  Yes [provider]  RESTASIS 0.05 % ophthalmic emulsion Place 1 drop into both eyes daily. 04/21/20  Yes [provider]  Simethicone (GAS-X PO) Take 1 tablet by mouth daily.   Yes [provider]  dapagliflozin propanediol (FARXIGA) 10 MG TABS tablet Take 1 tablet (10 mg total) by mouth daily before breakfast. 01/08/22   [name], FNP  hydrALAZINE (APRESOLINE) 50 MG tablet Take 1 tablet (50 mg total) by mouth every 8 (eight) hours. Patient taking differently: Take 50 mg by mouth in the morning and at bedtime. 12/10/21   [name], FNP  nitroGLYCERIN (NITROSTAT) 0.4 MG SL tablet DISSOLVE 1 TAB UNDER TONGUE AT ONSET OF CHEST PAIN. REPEAT EVERY 5 MINUTES FOR 2 DOSES AS NEEDED 03/05/20   [name] F, FNP  polyethylene glycol-electrolytes (NULYTELY) 420 g  solution As directed 12/16/21   Rourk, Cristopher Estimable, MD    Physical Exam: BP (!) 172/102 (BP Location: Left Arm)   Pulse (!) 101   Temp 98.2 F (36.8 C)   Resp 18   Ht '5\' 8"'$  (1.727 m)   Wt 89 kg   SpO2 95%   BMI 29.83 kg/m   General: 66 y.o. year-old male well developed well nourished in no acute distress.  Alert and oriented x3. HEENT: NCAT, EOMI, dry mucous membrane. Neck: Supple, trachea medial Cardiovascular: Regular rate and rhythm with no rubs or gallops.  No thyromegaly or JVD noted.  No lower  extremity edema. 2/4 pulses in all 4 extremities. Respiratory: Clear to auscultation with no wheezes or rales. Good inspiratory effort. Abdomen: Soft, nontender, mildly distended with normal bowel sounds x4 quadrants. Muskuloskeletal: No cyanosis, clubbing or edema noted bilaterally Neuro: CN II-XII intact, strength 5/5 x 4, sensation, reflexes intact Skin: No ulcerative lesions noted or rashes Psychiatry: Judgement and insight appear normal. Mood is appropriate for condition and setting          Labs on Admission:  Basic Metabolic Panel: Recent Labs  Lab 02/09/22 1312 02/09/22 2158  NA  --  135  K  --  5.6*  CL  --  102  CO2  --  20*  GLUCOSE  --  225*  BUN  --  40*  CREATININE 2.10* 1.91*  CALCIUM  --  8.8*   Liver Function Tests: Recent Labs  Lab 02/09/22 2158  AST 100*  ALT 51*  ALKPHOS 54  BILITOT 1.2  PROT 7.2  ALBUMIN 4.1   Recent Labs  Lab 02/09/22 2158  LIPASE 94*   No results for input(s): AMMONIA in the last 168 hours. CBC: Recent Labs  Lab 02/09/22 2158  WBC 4.7  HGB 11.9*  HCT 38.5*  MCV 99.7  PLT 78*   Cardiac Enzymes: No results for input(s): CKTOTAL, CKMB, CKMBINDEX, TROPONINI in the last 168 hours.  BNP (last 3 results) No results for input(s): BNP in the last 8760 hours.  ProBNP (last 3 results) No results for input(s): PROBNP in the last 8760 hours.  CBG: Recent Labs  Lab 02/10/22 0218  GLUCAP 192*    Radiological Exams on  Admission: CT HEAD WO CONTRAST (5MM)  Result Date: 02/10/2022 CLINICAL DATA:  Delerium, weakness and dizziness. EXAM: CT HEAD WITHOUT CONTRAST TECHNIQUE: Contiguous axial images were obtained from the base of the skull through the vertex without intravenous contrast. RADIATION DOSE REDUCTION: This exam was performed according to the departmental dose-optimization program which includes automated exposure control, adjustment of the mA and/or kV according to patient size and/or use of iterative reconstruction technique. COMPARISON:  None Available. FINDINGS: Brain: There is mild-to-moderate cerebral atrophy and atrophic ventriculomegaly with mild small-vessel disease of the cerebral white matter, slight cerebellar atrophy. No focal asymmetry is seen concerning for an acute cortical based infarct, hemorrhage, mass or midline shift. There are scattered dural calcifications in the frontal falx. Basal cisterns are clear. Vascular: There are patchy calcifications in the carotid siphons but no hyperdense central vasculature. Skull: No fracture or focal lesion is seen. Sinuses/Orbits: No acute finding. Other: None. IMPRESSION: Atrophy and small-vessel disease with no acute intracranial CT findings. Carotid atherosclerosis. Electronically Signed   By: Telford Nab M.D.   On: 02/10/2022 03:26   CT Abdomen W Contrast  Result Date: 02/10/2022 CLINICAL DATA:  Weight loss, abdominal bloating, diarrhea EXAM: CT ABDOMEN WITH CONTRAST TECHNIQUE: Multidetector CT imaging of the abdomen was performed using the standard protocol following bolus administration of intravenous contrast. RADIATION DOSE REDUCTION: This exam was performed according to the departmental dose-optimization program which includes automated exposure control, adjustment of the mA and/or kV according to patient size and/or use of iterative reconstruction technique. CONTRAST:  73m OMNIPAQUE IOHEXOL 300 MG/ML  SOLN COMPARISON:  CT abdomen/pelvis dated  05/28/2016 FINDINGS: Lower chest: Mild patchy opacities at the lung bases, favoring post infectious inflammatory scarring. Hepatobiliary: Hepatic steatosis. Gallbladder is unremarkable. No intrahepatic or extrahepatic duct dilatation. Pancreas: Within normal limits. Spleen: Within normal limits. Adrenals/Urinary Tract: Adrenal glands are within normal limits. Kidneys  are within normal limits.  No hydronephrosis. Stomach/Bowel: Stomach is within normal limits. Visualized bowel is unremarkable. Vascular/Lymphatic: No evidence of abdominal aortic aneurysm. Atherosclerotic calcifications of the abdominal aorta and branch vessels. No suspicious abdominal lymphadenopathy. Other: No abdominal ascites. Musculoskeletal: Mild degenerative changes of the visualized thoracolumbar spine. IMPRESSION: Hepatic steatosis. Otherwise negative CT abdomen. Please note that the pelvis was not imaged. Mild patchy opacities at the lung bases, favoring post infectious/inflammatory scarring. Electronically Signed   By: Julian Hy M.D.   On: 02/10/2022 01:38   DG Abdomen Acute W/Chest  Result Date: 02/10/2022 CLINICAL DATA:  Abdominal pain/distension after drinking oral contrast for routine CT EXAM: DG ABDOMEN ACUTE WITH 1 VIEW CHEST COMPARISON:  CT abdomen dated 02/09/2022 FINDINGS: Mild platelike scarring/atelectasis in the right mid lung. Left lung is clear. No pleural effusion or pneumothorax. The heart is normal in size. Nonobstructive bowel gas pattern. No evidence of free air under the diaphragm on the upright view. Right hip arthroplasty. Excretory contrast in the bladder. IMPRESSION: Negative abdominal radiographs.  No acute cardiopulmonary disease. Electronically Signed   By: Julian Hy M.D.   On: 02/10/2022 01:35    EKG: I independently viewed the EKG done and my findings are as followed: Normal sinus rhythm at a rate of 80 bpm  Assessment/Plan Present on Admission:  Dehydration  Thrombocytopenia (HCC)   Essential hypertension  CAD (coronary artery disease)  Principal Problem:   Dehydration Active Problems:   CAD (coronary artery disease)   Essential hypertension   Mixed hyperlipidemia   Thrombocytopenia (HCC)   Nausea & vomiting   Diarrhea   Type 2 diabetes mellitus with hyperglycemia (HCC)   AKI (acute kidney injury) ([place])   Hallucination   Hyperkalemia   Elevated lipase  Dehydration in the setting of nausea, vomiting, diarrhea Continue IV hydration  Nausea and vomiting possibly precipitated by IV contrast Continue IV Zofran as needed  Acute Diarrhea C. difficile and GI panel will be checked  Acute kidney injury BUN/creatinine 40/1.91 (baseline creatinine at 1.2-1.5) Continue gentle hydration Renally adjust medications, avoid nephrotoxic agents/dehydration/hypotension  Hallucination due to unknown cause at this time This may be due to patient's dehydrated state Continue gentle hydration and continue to monitor patient for improvement of symptoms  T2DM with hyperglycemia Continue ISS and hypoglycemia protocol Continue Semglee 10 units nightly and adjust dose accordingly  Hyperkalemia IV hydration was provided Continue Lokelma BMP will be rechecked and further treatment of hyperkalemia based on findings on the BMP  Chronic thrombocytopenia Platelets 78; continue to monitor platelet levels  Elevated lipase Lipase 94, CT of abdomen only showed hepatic steatosis without any other abnormality Patient denies any abdominal pain Continue to monitor lipase level  CAD s/p stent placement Continue Lipitor, Plavix, Imdur, nitroglycerin as needed, Lopressor  Essential hypertension Continue amlodipine, Imdur, losartan, Lopressor  Mixed hyperlipidemia Continue Lipitor    DVT prophylaxis: SCDs (no chemoprophylaxis due to thrombocytopenia)  Code Status: Full code  Consults: None  Family Communication: None at bedside  Severity of Illness: The appropriate  patient status for this patient is OBSERVATION. Observation status is judged to be reasonable and necessary in order to provide the required intensity of service to ensure the patient's safety. The patient's presenting symptoms, physical exam findings, and initial radiographic and laboratory data in the context of their medical condition is felt to place them at decreased risk for further clinical deterioration. Furthermore, it is anticipated that the patient will be medically stable for discharge from the hospital within  2 midnights of admission.   Author: Bernadette Hoit, DO 02/10/2022 6:14 AM  For on call review [email or web address].

## 2022-02-10 NOTE — Progress Notes (Signed)
PROGRESS NOTE  [name] Foster EHM:[id number] DOB: [birth year] DOA: 02/10/2022 PCP: [name], FNP  Brief History:  66 year old male with a history of diabetes mellitus type 2, coronary disease, hyperlipidemia, pulmonary hypertension, systemic hypertension, B12 deficiency presenting with 3 to 4-week history of diarrhea.  The patient states that he was having up to 10 bowel movements daily initially.  At the time of admission, the patient was still having 5-6 loose bowel movements on a daily basis.  There is no melena or hematochezia.  He has had some abdominal cramping associated with the diarrhea but denies any fevers, chills, dysuria, hematuria.  The patient has not had any vomiting, but he did have some difficulty tolerating oral contrast for CT ordered on 02/09/2022.  He did have some emesis with that.  He states that he started on a new oral diabetic medication about 2 days prior to this admission.  He was previously on Jardiance which she could not afford.  He denies any headache, neck pain, chest pain, shortness breath, hemoptysis.  There is no arthralgias or synovitis.  He has not had issues with diarrhea in the past.  In fact, the patient had a colonoscopy performed on 01/14/2022 which showed 1 descending colon polyp.  It was otherwise normal. In the ED, the patient was afebrile and hemodynamically stable with oxygen saturation 95% on room air.  WBC 4.7, hemoglobin 9.9, platelets 70,000.  Sodium 135, potassium 5.6, bicarbonate 20, serum creatinine 1.91.  AST 100, ALT 51, alk phos 54, total bilirubin 1.2.  02/09/2022 CT abdomen showed hepatic steatosis, bibasilar patchy opacities, normal gallbladder without any ductal dilatation.  Acute abdominal series was negative.     Assessment and Plan: * Acute renal failure superimposed on stage 3a chronic kidney disease (HCC) Baseline creatinine 1.2-1.5 Presented with serum creatinine 2.10 Due to volume depletion Continue IVF  CAD  (coronary artery disease) No chest pain presently Continue metoprolol, statin, imdur and Plavix  Diarrhea Check stool pathogen panel Check cdiff  Transaminasemia RUQ ultrasound Pt has RUQ abd pain on exam  Elevated lipase Unclear clinical significane CT abd without pancreatic stranding  Hyperkalemia lokelma given Improving with IVF  Type 2 diabetes mellitus with hyperglycemia (HCC) Continue semglee novolog sliding scale Check A1C  Thrombocytopenia (HCC) Chronic dating back to July 2018 Acute worsening due to acute medical illness  Mixed hyperlipidemia Hold statin temporarily due to elevated LFTs  Essential hypertension Continue amlodipine and metoprolol Hold losartan secondary to AKI        Family Communication:  no Family at bedside  Consultants:  none  Code Status:  FULL  DVT Prophylaxis:   [place] Lovenox   Procedures: As Listed in Progress Note Above  Antibiotics: None      Subjective: Patient continues to have loose stools.  He denies any hematochezia, melena.  He has some crampy abdominal pain.  He denies any nausea, vomiting, chest pain, shortness breath, fever, chills.  Objective: Vitals:   02/10/22 0214 02/10/22 0434 02/10/22 0849 02/10/22 0930  BP: (!) 147/89 (!) 172/102 (!) 155/87 (!) 135/124  Pulse: 75 (!) 101 100 98  Resp: '17 18  18  ' Temp:  98.2 F (36.8 C)  98.6 F (37 C)  TempSrc:    Oral  SpO2: 95% 95%  95%  Weight:      Height:        Intake/Output Summary (Last 24 hours) at 02/10/2022 1013 Last data filed at 02/10/2022  0700 Gross per 24 hour  Intake 240 ml  Output --  Net 240 ml   Weight change:  Exam:  General:  Pt is alert, follows commands appropriately, not in acute distress HEENT: No icterus, No thrush, No neck mass, [place]/AT Cardiovascular: RRR, S1/S2, no rubs, no gallops Respiratory: CTA bilaterally, no wheezing, no crackles, no rhonchi Abdomen: Soft/+BS, RUQ  tender, non distended, no guarding Extremities:  No edema, No lymphangitis, No petechiae, No rashes, no synovitis   Data Reviewed: I have personally reviewed following labs and imaging studies Basic Metabolic Panel: Recent Labs  Lab 02/09/22 1312 02/09/22 2158 02/10/22 0617  NA  --  135 135  K  --  5.6* 4.3  CL  --  102 108  CO2  --  20* 20*  GLUCOSE  --  225* 141*  BUN  --  40* 41*  CREATININE 2.10* 1.91* 1.76*  CALCIUM  --  8.8* 8.5*  MG  --   --  1.4*  PHOS  --   --  2.5   Liver Function Tests: Recent Labs  Lab 02/09/22 2158  AST 100*  ALT 51*  ALKPHOS 54  BILITOT 1.2  PROT 7.2  ALBUMIN 4.1   Recent Labs  Lab 02/09/22 2158  LIPASE 94*   No results for input(s): AMMONIA in the last 168 hours. Coagulation Profile: No results for input(s): INR, PROTIME in the last 168 hours. CBC: Recent Labs  Lab 02/09/22 2158 02/10/22 0617  WBC 4.7 3.0*  HGB 11.9* 11.1*  HCT 38.5* 34.0*  MCV 99.7 96.3  PLT 78* 54*   Cardiac Enzymes: No results for input(s): CKTOTAL, CKMB, CKMBINDEX, TROPONINI in the last 168 hours. BNP: Invalid input(s): POCBNP CBG: Recent Labs  Lab 02/10/22 0218 02/10/22 0708  GLUCAP 192* 125*   HbA1C: No results for input(s): HGBA1C in the last 72 hours. Urine analysis:    Component Value Date/Time   COLORURINE YELLOW 02/09/2022 2329   APPEARANCEUR CLEAR 02/09/2022 2329   LABSPEC 1.036 (H) 02/09/2022 2329   PHURINE 5.0 02/09/2022 2329   GLUCOSEU >=500 (A) 02/09/2022 2329   HGBUR NEGATIVE 02/09/2022 2329   BILIRUBINUR NEGATIVE 02/09/2022 2329   KETONESUR 5 (A) 02/09/2022 2329   PROTEINUR 100 (A) 02/09/2022 2329   NITRITE NEGATIVE 02/09/2022 2329   LEUKOCYTESUR NEGATIVE 02/09/2022 2329   Sepsis Labs: '@LABRCNTIP' (procalcitonin:4,lacticidven:4) )No results found for this or any previous visit (from the past 240 hour(s)).   Scheduled Meds:  amLODipine  5 mg Oral Daily   atorvastatin  40 mg Oral q1800   clopidogrel  75 mg Oral Daily   enoxaparin (LOVENOX) injection  40 mg  Subcutaneous Q24H   feeding supplement  237 mL Oral BID BM   insulin aspart  0-5 Units Subcutaneous QHS   insulin aspart  0-9 Units Subcutaneous TID WC   insulin glargine-yfgn  10 Units Subcutaneous QHS   isosorbide mononitrate  30 mg Oral Daily   metoprolol tartrate  50 mg Oral BID   pantoprazole (PROTONIX) IV  40 mg Intravenous Q12H   Continuous Infusions:  sodium chloride 100 mL/hr at 02/10/22 0901    Procedures/Studies: CT HEAD WO CONTRAST (5MM)  Result Date: 02/10/2022 CLINICAL DATA:  Delerium, weakness and dizziness. EXAM: CT HEAD WITHOUT CONTRAST TECHNIQUE: Contiguous axial images were obtained from the base of the skull through the vertex without intravenous contrast. RADIATION DOSE REDUCTION: This exam was performed according to the departmental dose-optimization program which includes automated exposure control, adjustment of the mA and/or kV  according to patient size and/or use of iterative reconstruction technique. COMPARISON:  None Available. FINDINGS: Brain: There is mild-to-moderate cerebral atrophy and atrophic ventriculomegaly with mild small-vessel disease of the cerebral white matter, slight cerebellar atrophy. No focal asymmetry is seen concerning for an acute cortical based infarct, hemorrhage, mass or midline shift. There are scattered dural calcifications in the frontal falx. Basal cisterns are clear. Vascular: There are patchy calcifications in the carotid siphons but no hyperdense central vasculature. Skull: No fracture or focal lesion is seen. Sinuses/Orbits: No acute finding. Other: None. IMPRESSION: Atrophy and small-vessel disease with no acute intracranial CT findings. Carotid atherosclerosis. Electronically Signed   By: Telford Nab M.D.   On: 02/10/2022 03:26   CT Abdomen W Contrast  Result Date: 02/10/2022 CLINICAL DATA:  Weight loss, abdominal bloating, diarrhea EXAM: CT ABDOMEN WITH CONTRAST TECHNIQUE: Multidetector CT imaging of the abdomen was performed  using the standard protocol following bolus administration of intravenous contrast. RADIATION DOSE REDUCTION: This exam was performed according to the departmental dose-optimization program which includes automated exposure control, adjustment of the mA and/or kV according to patient size and/or use of iterative reconstruction technique. CONTRAST:  58m OMNIPAQUE IOHEXOL 300 MG/ML  SOLN COMPARISON:  CT abdomen/pelvis dated 05/28/2016 FINDINGS: Lower chest: Mild patchy opacities at the lung bases, favoring post infectious inflammatory scarring. Hepatobiliary: Hepatic steatosis. Gallbladder is unremarkable. No intrahepatic or extrahepatic duct dilatation. Pancreas: Within normal limits. Spleen: Within normal limits. Adrenals/Urinary Tract: Adrenal glands are within normal limits. Kidneys are within normal limits.  No hydronephrosis. Stomach/Bowel: Stomach is within normal limits. Visualized bowel is unremarkable. Vascular/Lymphatic: No evidence of abdominal aortic aneurysm. Atherosclerotic calcifications of the abdominal aorta and branch vessels. No suspicious abdominal lymphadenopathy. Other: No abdominal ascites. Musculoskeletal: Mild degenerative changes of the visualized thoracolumbar spine. IMPRESSION: Hepatic steatosis. Otherwise negative CT abdomen. Please note that the pelvis was not imaged. Mild patchy opacities at the lung bases, favoring post infectious/inflammatory scarring. Electronically Signed   By: SJulian HyM.D.   On: 02/10/2022 01:38   [place]  Result Date: 01/31/2022 CLINICAL DATA:  Chronic kidney disease EXAM: RENAL / URINARY TRACT ULTRASOUND COMPLETE COMPARISON:  CT abdomen 05/28/2016 FINDINGS: Right Kidney: Renal measurements: 10.1 x 5.8 x 5.1 cm = volume: 163.9 mL. Echogenicity within normal limits. No mass or hydronephrosis visualized. Left Kidney: Renal measurements: 10.4 x 5.7 x 5 cm = volume: 154.43 mL. Echogenicity within normal limits. Two left renal cysts measuring 18 mm in  the interpolar aspect and 14 mm upper pole respectively. No solid mass or hydronephrosis visualized. Bladder: Appears normal for degree of bladder distention. Other: None. IMPRESSION: 1. No obstructive uropathy. 2. Two small left renal cysts. Electronically Signed   By: HKathreen DevoidM.D.   On: 01/31/2022 06:46   DG Abdomen Acute W/Chest  Result Date: 02/10/2022 CLINICAL DATA:  Abdominal pain/distension after drinking oral contrast for routine CT EXAM: DG ABDOMEN ACUTE WITH 1 VIEW CHEST COMPARISON:  CT abdomen dated 02/09/2022 FINDINGS: Mild platelike scarring/atelectasis in the right mid lung. Left lung is clear. No pleural effusion or pneumothorax. The heart is normal in size. Nonobstructive bowel gas pattern. No evidence of free air under the diaphragm on the upright view. Right hip arthroplasty. Excretory contrast in the bladder. IMPRESSION: Negative abdominal radiographs.  No acute cardiopulmonary disease. Electronically Signed   By: SJulian HyM.D.   On: 02/10/2022 01:35    DOrson Eva DO  [facility]  If 7PM-7AM, please contact night-coverage [email or web address] Password [facility]5/31/2023,  10:13 AM   LOS: 0 days

## 2022-02-10 NOTE — Assessment & Plan Note (Addendum)
No chest pain presently Continue metoprolol, statin, imdur and Plavix

## 2022-02-10 NOTE — Assessment & Plan Note (Signed)
Check stool pathogen panel--neg Check cdiff--neg Suspect nonspecific enteritis vs pancreatic insufficiency -volume and frequency significantly improved at time of dc -outpt GI follow up

## 2022-02-10 NOTE — Assessment & Plan Note (Signed)
Continue semglee novolog sliding scale Check A1C

## 2022-02-10 NOTE — Assessment & Plan Note (Signed)
Continue amlodipine and metoprolol Hold losartan secondary to AKI Restart hydralazine and HCTZ after d/c

## 2022-02-10 NOTE — Assessment & Plan Note (Signed)
lokelma given Improving with IVF

## 2022-02-10 NOTE — Hospital Course (Signed)
66 year old male with a history of diabetes mellitus type 2, coronary disease, hyperlipidemia, pulmonary hypertension, systemic hypertension, B12 deficiency presenting with 3 to 4-week history of diarrhea.  The patient states that he was having up to 10 bowel movements daily initially.  At the time of admission, the patient was still having 5-6 loose bowel movements on a daily basis.  There is no melena or hematochezia.  He has had some abdominal cramping associated with the diarrhea but denies any fevers, chills, dysuria, hematuria.  The patient has not had any vomiting, but he did have some difficulty tolerating oral contrast for CT ordered on 02/09/2022.  He did have some emesis with that.  He states that he started on a new oral diabetic medication about 2 days prior to this admission.  He was previously on Jardiance which she could not afford.  He denies any headache, neck pain, chest pain, shortness breath, hemoptysis.  There is no arthralgias or synovitis.  He has not had issues with diarrhea in the past.  In fact, the patient had a colonoscopy performed on 01/14/2022 which showed 1 descending colon polyp.  It was otherwise normal. In the ED, the patient was afebrile and hemodynamically stable with oxygen saturation 95% on room air.  WBC 4.7, hemoglobin 9.9, platelets 70,000.  Sodium 135, potassium 5.6, bicarbonate 20, serum creatinine 1.91.  AST 100, ALT 51, alk phos 54, total bilirubin 1.2.  02/09/2022 CT abdomen showed hepatic steatosis, bibasilar patchy opacities, normal gallbladder without any ductal dilatation.  Acute abdominal series was negative.

## 2022-02-10 NOTE — Assessment & Plan Note (Signed)
Unclear clinical significane CT abd without pancreatic stranding

## 2022-02-10 NOTE — ED Provider Notes (Signed)
[facility] Emergency Department Provider Note MRN:  [id number]  Arrival date & time: 02/10/22     Chief Complaint   Nausea Vomiting Diarrhea  History of Present Illness   [name] Foster is a 66 y.o. year-old male with a history of CAD, diabetes presenting to the ED with chief complaint of nausea vomiting diarrhea.  Nausea vomiting diarrhea and abdominal discomfort for several days.  Had a CT performed in the outpatient setting yesterday has been having worsening nausea vomiting since having to drink the contrast.  Not eating anything.  Also hallucinating today.  Review of Systems  A thorough review of systems was obtained and all systems are negative except as noted in the HPI and PMH.   Patient's Health History    Past Medical History:  Diagnosis Date   Arthritis    CAD (coronary artery disease)    a. 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 DES to left PDA 09/20/17 // c. 10/19: LCx and LPDA stents ok; s/p DES to pLAD and POBA to D1   Diabetes mellitus without complication ([place])    type 2    Diabetic nephropathy ([place])    Echocardiogram 08/2021    Echocardiogram 12/22: EF 60-65, no RWMA, GLS -19.4% (normal), normal RVSF, trivial MR, mild dilation of ascending aorta (37 mm)   Heart attack ([place])    " a blood clot is actually what caused my heart attack in 2018 " , he states he is unsure where the blood clot originated from    Hyperlipidemia    Hypertension    Insomnia    Pulmonary hypertension ([place])    a. TTE 03/25/17; EF 60-65%, nl WM, LV diastolic fxn nl, trivial AI, RV cavity size, wall thickness, and sys fxn nl, trivial TR, PASP 39 mmHg, trivial pericardial effusion // Echo 12/22: normal RVSP, PASP   SCCA (squamous cell carcinoma) of skin 03/26/2020   Mid Parietal Scalp (well diff)   Sleep apnea    could not use a cpap    Vitamin B12 deficiency 05/03/2020   Wears dentures    full top-partial bottom   Wears glasses     Past Surgical History:  Procedure Laterality Date   COLONOSCOPY  05/13/2010   Single anal papilla, 2 diminutive polyps at 10 cm, left-sided diverticula, one sigmoid and one a sending polyp, otherwise normal colon and TI.  Path with 1 tubular adenoma, otherwise hyperplastic polyps.  Recommendations repeat colonoscopy in 7 years.   COLONOSCOPY WITH PROPOFOL N/A 11/22/2019   Rourk, Cristopher Estimable, MD -  ten 4-9 mm polyps in the rectum, sigmoid colon and cecum were removed, three 12-25 mm polyps at the splenic flexure and hepatic flexure removed with clip placement to the largest polyp   COLONOSCOPY WITH PROPOFOL N/A 01/14/2022   Procedure: COLONOSCOPY WITH PROPOFOL;  Surgeon: Daneil Dolin, MD;  [facility];  Service: Endoscopy;  Laterality: N/A;  11:15am, asa 3   CORONARY ATHERECTOMY N/A 07/03/2018   Procedure: CORONARY ATHERECTOMY;  Surgeon: Burnell Blanks, MD;  Location: [place] [facility];  Service: Cardiovascular;  Laterality: N/A;   CORONARY STENT INTERVENTION N/A 09/20/2017   Procedure: CORONARY STENT INTERVENTION;  Surgeon: Leonie Man, MD;  Location: [place] [facility];  Service: Cardiovascular;  Laterality: N/A;   CORONARY STENT INTERVENTION N/A 07/03/2018   Procedure: CORONARY  STENT INTERVENTION;  Surgeon: Burnell Blanks, MD;  Location: [place] [facility];  Service: Cardiovascular;  Laterality: N/A;   CORONARY/GRAFT ACUTE MI REVASCULARIZATION N/A 03/25/2017   Procedure: Coronary/Graft Acute MI Revascularization;  Surgeon: Sherren Mocha, MD;  Location: [place] [facility];  Service: Cardiovascular;  Laterality: N/A;   GANGLION CYST EXCISION Left 07/25/2014   Procedure: EXCISION VOLAR AND DORSAL GANGLION LEFT WRIST;  Surgeon: Leanora Cover, MD;  Location: [place];  Service: Orthopedics;  Laterality: Left;   INTRAVASCULAR PRESSURE WIRE/FFR STUDY  N/A 09/20/2017   Procedure: INTRAVASCULAR PRESSURE WIRE/FFR STUDY;  Surgeon: Leonie Man, MD;  Location: [place] [facility];  Service: Cardiovascular;  Laterality: N/A;   INTRAVASCULAR PRESSURE WIRE/FFR STUDY N/A 06/30/2018   Procedure: INTRAVASCULAR PRESSURE WIRE/FFR STUDY;  Surgeon: Nelva Bush, MD;  Location: [place] [facility];  Service: Cardiovascular;  Laterality: N/A;   KNEE ARTHROSCOPY     rifgr and left   LEFT HEART CATH AND CORONARY ANGIOGRAPHY N/A 03/25/2017   Procedure: Left Heart Cath and Coronary Angiography;  Surgeon: Sherren Mocha, MD;  Location: [place] [facility];  Service: Cardiovascular;  Laterality: N/A;   LEFT HEART CATH AND CORONARY ANGIOGRAPHY N/A 09/20/2017   Procedure: LEFT HEART CATH AND CORONARY ANGIOGRAPHY;  Surgeon: Leonie Man, MD;  Location: [place] [facility];  Service: Cardiovascular;  Laterality: N/A;   LEFT HEART CATH AND CORONARY ANGIOGRAPHY N/A 06/30/2018   Procedure: LEFT HEART CATH AND CORONARY ANGIOGRAPHY;  Surgeon: Nelva Bush, MD;  Location: [place] [facility];  Service: Cardiovascular;  Laterality: N/A;   POLYPECTOMY  11/22/2019   Procedure: POLYPECTOMY;  Surgeon: Daneil Dolin, MD;  [facility];  Service: Endoscopy;;   POLYPECTOMY  01/14/2022   Procedure: POLYPECTOMY;  Surgeon: Daneil Dolin, MD;  [facility];  Service: Endoscopy;;   SHOULDER ARTHROSCOPY  2012   left   SHOULDER ARTHROSCOPY     right   TEMPORARY PACEMAKER N/A 03/25/2017   Procedure: Temporary Pacemaker;  Surgeon: Sherren Mocha, MD;  Location: [place] [facility];  Service: Cardiovascular;  Laterality: N/A;   TONSILLECTOMY     TOTAL HIP ARTHROPLASTY Right 04/25/2019   TOTAL HIP ARTHROPLASTY Right 04/25/2019   Procedure: TOTAL HIP ARTHROPLASTY ANTERIOR APPROACH;  Surgeon: Rod Can, MD;  Location: [facility];  Service: Orthopedics;  Laterality: Right;   TOTAL KNEE ARTHROPLASTY  2002   left    Family History  Problem Relation Age  of Onset   Hypertension Mother    Hypertension Father    Hypertension Brother    Heart disease Maternal Grandfather    Colon cancer Neg Hx     Social History   Socioeconomic History   Marital status: Widowed    Spouse name: Not on file   Number of children: 2   Years of education: Not on file   Highest education level: Not on file  Occupational History   Occupation: Retired  Tobacco Use   Smoking status: Former    Packs/day: 2.00    Years: 30.00    Pack years: 60.00    Types: Cigarettes    Quit date: 03/25/2017    Years since quitting: 4.8   Smokeless tobacco: Never  Vaping Use   Vaping Use: Never used  Substance and Sexual Activity   Alcohol use: Yes    Comment: 3-6 shots of liquor daily   Drug use: Not Currently    Types: Marijuana    Comment: last used  about 10 years ago (08/23/19)   Sexual activity: Not Currently  Other Topics Concern   Not on file  Social History Narrative   Per wife, pt was preparing to retire soon from his job at [employer].   05-24-2021 - wife passed away a couple weeks ago - he lives alone, but has lot of family nearby   Son next door, daughter lives in [place] Determinants of Health   Financial Resource Strain: Low Risk    Difficulty of Paying Living Expenses: Not hard at all  Food Insecurity: No Food Insecurity   Worried About Charity fundraiser in the Last Year: Never true   Arboriculturist in the Last Year: Never true  Transportation Needs: No Transportation Needs   Lack of Transportation (Medical): No   Lack of Transportation (Non-Medical): No  Physical Activity: Insufficiently Active   Days of Exercise per Week: 5 days   Minutes of Exercise per Session: 20 min  Stress: Stress Concern Present   Feeling of Stress : Rather much  Social Connections: Socially Isolated   Frequency of Communication with Friends and Family: More than three times a week   Frequency of Social Gatherings with Friends and Family: More than three times a  week   Attends Religious Services: Never   Marine scientist or Organizations: No   Attends Archivist Meetings: Never   Marital Status: Widowed  Human resources officer Violence: Not At Risk   Fear of Current or Ex-Partner: No   Emotionally Abused: No   Physically Abused: No   Sexually Abused: No     Physical Exam   Vitals:   02/10/22 0214 02/10/22 0434  BP: (!) 147/89 (!) 172/102  Pulse: 75 (!) 101  Resp: 17 18  Temp:  98.2 F (36.8 C)  SpO2: 95% 95%    CONSTITUTIONAL: Well-appearing, NAD NEURO/PSYCH:  Alert and oriented x 3, normal and symmetric strength and sensation, normal coordination, normal speech; subtle right hand tremor at rest EYES:  eyes equal and reactive ENT/NECK:  no LAD, no JVD CARDIO: Regular rate, well-perfused, normal S1 and S2 PULM:  CTAB no wheezing or rhonchi GI/GU:  non-distended, non-tender MSK/SPINE:  No gross deformities, no edema SKIN:  no rash, atraumatic   *Additional and/or pertinent findings included in MDM below  Diagnostic and Interventional Summary    EKG Interpretation  Date/Time:  Wednesday Feb 10 2022 03:30:08 EDT Ventricular Rate:  80 PR Interval:  160 QRS Duration: 97 QT Interval:  388 QTC Calculation: 448 R Axis:   69 Text Interpretation: Sinus rhythm Low voltage, precordial leads Borderline repolarization abnormality Confirmed by Gerlene Fee [phone]) on 02/10/2022 7:13:21 AM       Labs Reviewed  LIPASE, BLOOD - Abnormal; Notable for the following components:      Result Value   Lipase 94 (*)    All other components within normal limits  COMPREHENSIVE METABOLIC PANEL - Abnormal; Notable for the following components:   Potassium 5.6 (*)    CO2 20 (*)    Glucose, Bld 225 (*)    BUN 40 (*)    Creatinine, Ser 1.91 (*)    Calcium 8.8 (*)    AST 100 (*)    ALT 51 (*)    GFR, Estimated 38 (*)    All other components within normal limits  CBC - Abnormal; Notable for the following components:   RBC 3.86 (*)     Hemoglobin 11.9 (*)  HCT 38.5 (*)    RDW 15.6 (*)    Platelets 78 (*)    All other components within normal limits  URINALYSIS, ROUTINE W REFLEX MICROSCOPIC - Abnormal; Notable for the following components:   Specific Gravity, Urine 1.036 (*)    Glucose, UA >=500 (*)    Ketones, ur 5 (*)    Protein, ur 100 (*)    All other components within normal limits  BASIC METABOLIC PANEL - Abnormal; Notable for the following components:   CO2 20 (*)    Glucose, Bld 141 (*)    BUN 41 (*)    Creatinine, Ser 1.76 (*)    Calcium 8.5 (*)    GFR, Estimated 42 (*)    All other components within normal limits  MAGNESIUM - Abnormal; Notable for the following components:   Magnesium 1.4 (*)    All other components within normal limits  GLUCOSE, CAPILLARY - Abnormal; Notable for the following components:   Glucose-Capillary 125 (*)    All other components within normal limits  CBG MONITORING, ED - Abnormal; Notable for the following components:   Glucose-Capillary 192 (*)    All other components within normal limits  C DIFFICILE QUICK SCREEN W PCR REFLEX    GASTROINTESTINAL PANEL BY PCR, STOOL (REPLACES STOOL CULTURE)  C DIFFICILE QUICK SCREEN W PCR REFLEX    GASTROINTESTINAL PANEL BY PCR, STOOL (REPLACES STOOL CULTURE)  PHOSPHORUS  HIV ANTIBODY (ROUTINE TESTING W REFLEX)  CBC  HEMOGLOBIN A1C    CT HEAD WO CONTRAST (5MM)  Final Result    DG Abdomen Acute W/Chest  Final Result      Medications  enoxaparin (LOVENOX) injection 40 mg (has no administration in time range)  0.9 %  sodium chloride infusion (has no administration in time range)  ondansetron (ZOFRAN) injection 4 mg (has no administration in time range)  feeding supplement (ENSURE ENLIVE / ENSURE PLUS) liquid 237 mL (has no administration in time range)  insulin glargine-yfgn (SEMGLEE) injection 10 Units (has no administration in time range)  insulin aspart (novoLOG) injection 0-9 Units (has no administration in time range)   insulin aspart (novoLOG) injection 0-5 Units (has no administration in time range)  sodium zirconium cyclosilicate (LOKELMA) packet 10 g (has no administration in time range)  pantoprazole (PROTONIX) injection 40 mg (has no administration in time range)  sodium chloride 0.9 % bolus 1,000 mL (0 mLs Intravenous Stopped 02/10/22 0413)  ondansetron (ZOFRAN) injection 4 mg (4 mg Intravenous Given 02/10/22 0322)     Procedures  /  Critical Care Procedures  ED Course and Medical Decision Making  Initial Impression and Ddx Initial differential diagnosis includes electrolyte disturbance, dehydration, AKI, small bowel obstruction.  Regarding the hallucinations, perhaps triggered by dehydration, perhaps some underlying dementia or possibly Parkinson's disease given the hand tremor.  Patient will need fluids, admission.  CT abdomen yesterday without acute process, plain film today also reassuring.  Nontender on exam.  Past medical/surgical history that increases complexity of ED encounter: None  Interpretation of Diagnostics I personally reviewed the EKG and my interpretation is as follows: Sinus rhythm    Labs reveal acute kidney injury, likely prerenal.  Patient Reassessment and Ultimate Disposition/Management We will admit to medicine for dehydration, AKI, hallucinations.  Patient management required discussion with the following services or consulting groups:  Hospitalist Service  Complexity of Problems Addressed Acute illness or injury that poses threat of life of bodily function  Additional Data Reviewed and Analyzed Further history obtained from:  Further history from spouse/family member  Additional Factors Impacting ED Encounter Risk Consideration of hospitalization  Barth Kirks. Sedonia Small, MD [place] [email or web address]  Final Clinical Impressions(s) / ED Diagnoses     ICD-10-CM   1. Dehydration  E86.0     2. AKI (acute kidney  injury) ([place])  N17.9     3. Hallucinations  R44.3       ED Discharge Orders     None        Discharge Instructions Discussed with and Provided to Patient:   Discharge Instructions   None      Maudie Flakes, MD 02/10/22 [phone]

## 2022-02-10 NOTE — Assessment & Plan Note (Signed)
RUQ ultrasound--hepatic steatosis Due to steatosis and Etoh

## 2022-02-10 NOTE — Assessment & Plan Note (Addendum)
Hold statin temporarily due to elevated LFTs

## 2022-02-11 DIAGNOSIS — I10 Essential (primary) hypertension: Secondary | ICD-10-CM | POA: Diagnosis not present

## 2022-02-11 DIAGNOSIS — F1010 Alcohol abuse, uncomplicated: Secondary | ICD-10-CM

## 2022-02-11 DIAGNOSIS — R748 Abnormal levels of other serum enzymes: Secondary | ICD-10-CM | POA: Diagnosis not present

## 2022-02-11 DIAGNOSIS — N1831 Chronic kidney disease, stage 3a: Secondary | ICD-10-CM

## 2022-02-11 DIAGNOSIS — N179 Acute kidney failure, unspecified: Secondary | ICD-10-CM | POA: Diagnosis not present

## 2022-02-11 LAB — CBC
HCT: 31.4 % — ABNORMAL LOW (ref 39.0–52.0)
Hemoglobin: 10 g/dL — ABNORMAL LOW (ref 13.0–17.0)
MCH: 31.5 pg (ref 26.0–34.0)
MCHC: 31.8 g/dL (ref 30.0–36.0)
MCV: 99.1 fL (ref 80.0–100.0)
Platelets: 45 10*3/uL — ABNORMAL LOW (ref 150–400)
RBC: 3.17 MIL/uL — ABNORMAL LOW (ref 4.22–5.81)
RDW: 14.9 % (ref 11.5–15.5)
WBC: 2.3 10*3/uL — ABNORMAL LOW (ref 4.0–10.5)
nRBC: 0 % (ref 0.0–0.2)

## 2022-02-11 LAB — COMPREHENSIVE METABOLIC PANEL
ALT: 37 U/L (ref 0–44)
AST: 73 U/L — ABNORMAL HIGH (ref 15–41)
Albumin: 3.3 g/dL — ABNORMAL LOW (ref 3.5–5.0)
Alkaline Phosphatase: 44 U/L (ref 38–126)
Anion gap: 10 (ref 5–15)
BUN: 32 mg/dL — ABNORMAL HIGH (ref 8–23)
CO2: 19 mmol/L — ABNORMAL LOW (ref 22–32)
Calcium: 8.4 mg/dL — ABNORMAL LOW (ref 8.9–10.3)
Chloride: 107 mmol/L (ref 98–111)
Creatinine, Ser: 1.62 mg/dL — ABNORMAL HIGH (ref 0.61–1.24)
GFR, Estimated: 47 mL/min — ABNORMAL LOW (ref >60)
Glucose, Bld: 75 mg/dL (ref 70–99)
Potassium: 3.7 mmol/L (ref 3.5–5.1)
Sodium: 136 mmol/L (ref 135–145)
Total Bilirubin: 0.9 mg/dL (ref 0.3–1.2)
Total Protein: 6 g/dL — ABNORMAL LOW (ref 6.5–8.1)

## 2022-02-11 LAB — GASTROINTESTINAL PANEL BY PCR, STOOL (REPLACES STOOL CULTURE)

## 2022-02-11 LAB — GLUCOSE, CAPILLARY
Glucose-Capillary: 73 mg/dL (ref 70–99)
Glucose-Capillary: 120 mg/dL — ABNORMAL HIGH (ref 70–99)

## 2022-02-11 LAB — VITAMIN B12: Vitamin B-12: 252 pg/mL (ref 180–914)

## 2022-02-11 LAB — FOLATE: Folate: 5.5 ng/mL — ABNORMAL LOW (ref >5.9)

## 2022-02-11 LAB — T4, FREE: Free T4: 0.9 ng/dL (ref 0.61–1.12)

## 2022-02-11 LAB — TSH: TSH: 4.25 u[IU]/mL (ref 0.350–4.500)

## 2022-02-11 LAB — APTT: aPTT: 25 seconds (ref 24–36)

## 2022-02-11 MED ORDER — CYANOCOBALAMIN 1000 MCG/ML IJ SOLN
1000.0000 ug | Freq: Once | INTRAMUSCULAR | Status: AC
Start: 2022-02-11 — End: 2022-02-11
  Administered 2022-02-11: 1000 ug via INTRAMUSCULAR
  Filled 2022-02-11: qty 1

## 2022-02-11 MED ORDER — CYANOCOBALAMIN 500 MCG PO TABS: 500.0000 ug | ORAL_TABLET | Freq: Every day | ORAL | Status: DC

## 2022-02-11 MED ORDER — POTASSIUM CHLORIDE IN NACL 20-0.9 MEQ/L-% IV SOLN: INTRAVENOUS | Status: DC

## 2022-02-11 MED ORDER — ENSURE ENLIVE PO LIQD: 237.0000 mL | Freq: Two times a day (BID) | ORAL | 12 refills | Status: DC

## 2022-02-11 MED ORDER — FOLIC ACID 1 MG PO TABS
1.0000 mg | ORAL_TABLET | Freq: Every day | ORAL | Status: DC
  Administered 2022-02-11: 1 mg via ORAL
  Filled 2022-02-11: qty 1

## 2022-02-11 MED ORDER — FOLIC ACID 1 MG PO TABS: 1.0000 mg | ORAL_TABLET | Freq: Every day | ORAL | Status: DC

## 2022-02-11 MED ORDER — VITAMIN B-12 100 MCG PO TABS: 500.0000 ug | ORAL_TABLET | Freq: Every day | ORAL | Status: DC

## 2022-02-11 NOTE — Plan of Care (Signed)

## 2022-02-11 NOTE — Assessment & Plan Note (Signed)
Continue to drink 1 pint liquor daily --contributing to thrombocytopenia and transaminasemia --no signs of withdrawal during hospitalization

## 2022-02-11 NOTE — Progress Notes (Unsigned)
Virtual Visit via Telephone Note [facility]  I connected with [name] Foster  on 02/12/2022 at 1:11 PM by telephone and verified that I am speaking with the correct person using two identifiers.  Location: [name] Foster: Home Provider: [facility]   I discussed the limitations, risks, security and privacy concerns of performing an evaluation and management service by telephone and the availability of in person appointments. I also discussed with the [name] Foster that there may be a [name] Foster responsible charge related to this service. The [name] Foster expressed understanding and agreed to proceed.  REASON FOR VISIT: Follow-up for thrombocytopenia  PRIOR THERAPY: None  CURRENT THERAPY: Under work-up  INTERVAL HISTORY: [name] Foster is contacted today for follow-up of thrombocytopenia.  [name] Foster was seen for initial consultation by Dr. Delton Coombes on 01/29/2022.  [name] Foster returns today to discuss results of work-up.  At today's visit, [name] Foster reports feeling fair.  Since [name] Foster last visit, [name] Foster was hospitalized from 02/10/2022 through 02/11/2022 for dehydration, AKI, and nausea/vomiting/diarrhea.  [name] Foster has occasional epistaxis and easy bruising.  Denies any petechial rash or GI bleeding.  No fever, chills, or night sweats.  [name] Foster recently lost some weight secondary to poor appetite, but reports that [name] Foster appetite has improved over the past week.  [name] Foster has a history of heavy drinking, but states that [name] Foster is planning on attending [name] Foster first Alcoholics Anonymous meeting tomorrow along with [name] Foster daughter who will be there for support.  [name] Foster reports 75% appetite and 75% energy.  [name] Foster has no new complaints at today's visit.    OBSERVATIONS/OBJECTIVE: Review of Systems  Constitutional:  Negative for chills, diaphoresis, fever, malaise/fatigue and weight loss.  Respiratory:  Negative for cough and shortness of breath.   Cardiovascular:  Negative for chest pain and palpitations.  Gastrointestinal:  Negative for  abdominal pain, blood in stool, melena, nausea and vomiting.  Neurological:  Negative for dizziness and headaches.  Endo/Heme/Allergies:  Bruises/bleeds easily.    PHYSICAL EXAM (per limitations of virtual telephone visit): The [name] Foster is alert and oriented x 3, exhibiting adequate mentation, good mood, and ability to speak in full sentences and execute sound judgement.   ASSESSMENT & PLAN: 1.  Thrombocytopenia: - [name] Foster had mild thrombocytopenia since 2018.  CBC on 12/10/2021 showed platelet count 94. - Has easy bruising on the forearms since [name] Foster was started on Plavix in 2018 after MI and stents.  [name] Foster had 1 nosebleed 1 month ago which lasted about 10 minutes.  No bleeding per rectum or melena. - Abdominal ultrasound (08/28/2019): Fatty liver with normal spleen. - CT abdomen/pelvis (02/09/2022): Hepatic steatosis with normal spleen.  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 - No B symptoms or infections.   - [name] Foster reports 10 pound weight loss in the last 6 months due to decreased appetite.  [name] Foster also reported occasional bloating of the abdomen with diarrhea after eating and abdominal tightness. - Hematology work-up (01/29/2022):  Marginal vitamin B12 at 219. Low folic acid 2.6.  Normal MMA and copper. Negative H. pylori, HIV, and hepatitis C.  Hepatitis B shows immunity from prior vaccination. Weakly positive rheumatoid factor 17.0.  Negative ANA.  Normal SPEP, mildly elevated LDH 196.  Normal reticulocytes. CBC shows Hgb 12.8/MCV 97.6, platelets 122 - During recent hospital stay (02/10/2022 - 02/11/2022), platelets dropped to 45.  Suspected to be reactive in the setting of acute illness, nausea, vomiting, and hospitalization.  Likely also delusional, as [name] Foster received large volume of IV  fluids while hospitalized. -Differential diagnosis favors mixed etiology of thrombocytopenia secondary to fatty liver disease, alcohol induced thrombocytopenia, B12  deficiency, and folic acid deficiency.  Less likely would be immune mediated thrombocytopenia or MDS. - PLAN: Recommend repeat CBC in 4 weeks. - Recommend vitamin B12 500 mcg daily and folic acid 106 mcg daily. - Repeat labs in 3 months with office visit 1 week after labs - Strongly encouraged on alcohol cessation.  [name] Foster is planning on attending [name] Foster first Alcoholics Anonymous meeting tomorrow.  2.  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  Quit smoking in 2018 after [name] Foster had a heart attack.  [name] Foster smoked 2 packs/day for 50 years. - [name] Foster drinks 1 pint of whiskey daily. - No family history of thrombocytopenia.  Brother had multiple myeloma.   FOLLOW UP INSTRUCTIONS: Labs in 4 weeks: CBC with differential Labs in 3 months: CBC/differential, vitamin B12, folate Office visit in 3 months, 1 week after labs   I discussed the assessment and treatment plan with the [name] Foster. The [name] Foster was provided an opportunity to ask questions and all were answered. The [name] Foster agreed with the plan and demonstrated an understanding of the instructions.   The [name] Foster was advised to call back or seek an in-person evaluation if the symptoms worsen or if the condition fails to improve as anticipated.  I provided 16 minutes of non-face-to-face time during this encounter.   Harriett Rush, PA-C 02/12/2022 1:35 PM

## 2022-02-11 NOTE — Discharge Summary (Addendum)
Physician Discharge Summary   Patient: [name] Foster MRN: [id number] DOB: August 24, 1956  Admit date:     02/10/2022  Discharge date: 02/11/22  Discharge Physician: Shanon Brow Lyzbeth Genrich   PCP: [name], FNP   Recommendations at discharge:   Please follow up with primary care provider within 1-2 weeks  Please repeat BMP and CBC in one week    Hospital Course: 66 year old male with a history of diabetes mellitus type 2, coronary disease, hyperlipidemia, pulmonary hypertension, systemic hypertension, B12 deficiency presenting with 3 to 4-week history of diarrhea.  The patient states that he was having up to 10 bowel movements daily initially.  At the time of admission, the patient was still having 5-6 loose bowel movements on a daily basis.  There is no melena or hematochezia.  He has had some abdominal cramping associated with the diarrhea but denies any fevers, chills, dysuria, hematuria.  The patient has not had any vomiting, but he did have some difficulty tolerating oral contrast for CT ordered on 02/09/2022.  He did have some emesis with that.  He states that he started on a new oral diabetic medication about 2 days prior to this admission.  He was previously on Jardiance which she could not afford.  He denies any headache, neck pain, chest pain, shortness breath, hemoptysis.  There is no arthralgias or synovitis.  He has not had issues with diarrhea in the past.  In fact, the patient had a colonoscopy performed on 01/14/2022 which showed 1 descending colon polyp.  It was otherwise normal. In the ED, the patient was afebrile and hemodynamically stable with oxygen saturation 95% on room air.  WBC 4.7, hemoglobin 9.9, platelets 70,000.  Sodium 135, potassium 5.6, bicarbonate 20, serum creatinine 1.91.  AST 100, ALT 51, alk phos 54, total bilirubin 1.2.  02/09/2022 CT abdomen showed hepatic steatosis, bibasilar patchy opacities, normal gallbladder without any ductal dilatation.  Acute abdominal series was  negative.  Assessment and Plan: * Acute renal failure superimposed on stage 3a chronic kidney disease (HCC) Baseline creatinine 1.2-1.5 Presented with serum creatinine 2.10 Due to volume depletion Continue IVF>>improving -serum creatinine 1.62 on day of d/c  CAD (coronary artery disease) No chest pain presently Continue metoprolol, statin, imdur and Plavix  Diarrhea Check stool pathogen panel--neg Check cdiff--neg Suspect nonspecific enteritis vs pancreatic insufficiency -volume and frequency significantly improved at time of dc -outpt GI follow up  Alcohol abuse Continue to drink 1 pint liquor daily --contributing to thrombocytopenia and transaminasemia --no signs of withdrawal during hospitalization  Transaminasemia RUQ ultrasound--hepatic steatosis Due to steatosis and Etoh  Elevated lipase Unclear clinical significane CT abd without pancreatic stranding  Hyperkalemia lokelma given Improved with IVF  Type 2 diabetes mellitus with hyperglycemia (HCC) Continue semglee during hospitalization novolog sliding scale Check A1C--6.0 -restart farxiga after d/c  Thrombocytopenia ([place]) Chronic dating back to July 2018 Acute worsening due to acute medical illness and dilution Folate 5.5>>supplement B12--252 (low normal)>>supplement  Mixed hyperlipidemia Hold statin temporarily due to elevated LFTs  Essential hypertension Continue amlodipine and metoprolol Hold losartan secondary to AKI Restart hydralazine and HCTZ after d/c         Consultants: none Procedures performed: none  Disposition: Home Diet recommendation:  Carb modified diet DISCHARGE MEDICATION: Allergies as of 02/11/2022   No Known Allergies      Medication List     STOP taking these medications    losartan 100 MG tablet Commonly known as: COZAAR   polyethylene glycol-electrolytes 420 g solution  Commonly known as: NuLYTELY       TAKE these medications    albuterol 108 (90  Base) MCG/ACT inhaler Commonly known as: VENTOLIN HFA TAKE 2 PUFFS BY MOUTH EVERY 6 HOURS AS NEEDED FOR WHEEZE OR SHORTNESS OF BREATH What changed: See the new instructions.   amLODipine 5 MG tablet Commonly known as: NORVASC Take 1 tablet (5 mg total) by mouth daily.   atorvastatin 40 MG tablet Commonly known as: LIPITOR TAKE 1 TABLET BY MOUTH DAILY AT 6 PM. What changed: when to take this   clopidogrel 75 MG tablet Commonly known as: PLAVIX Take 1 tablet (75 mg total) by mouth daily.   CoQ-10 100 MG Caps Take 100 mg by mouth daily.   dapagliflozin propanediol 10 MG Tabs tablet Commonly known as: Farxiga Take 1 tablet (10 mg total) by mouth daily before breakfast.   feeding supplement Liqd Take 237 mLs by mouth 2 (two) times daily between meals.   folic acid 1 MG tablet Commonly known as: FOLVITE Take 1 tablet (1 mg total) by mouth daily.   GAS-X PO Take 1 tablet by mouth daily.   hydrALAZINE 50 MG tablet Commonly known as: APRESOLINE Take 1 tablet (50 mg total) by mouth every 8 (eight) hours. What changed: when to take this   hydrochlorothiazide 12.5 MG tablet Commonly known as: HYDRODIURIL Take 1 tablet (12.5 mg total) by mouth daily. (NEEDS TO BE SEEN BEFORE NEXT REFILL) What changed: additional instructions   isosorbide mononitrate 30 MG 24 hr tablet Commonly known as: IMDUR TAKE 1/2 OF A TABLET (15 MG TOTAL) BY MOUTH DAILY What changed: See the new instructions.   metoprolol tartrate 50 MG tablet Commonly known as: LOPRESSOR Take 1 tablet (50 mg total) by mouth 2 (two) times daily.   mirtazapine 7.5 MG tablet Commonly known as: REMERON TAKE 1 TABLET BY MOUTH EVERYDAY AT BEDTIME What changed: See the new instructions.   nitroGLYCERIN 0.4 MG SL tablet Commonly known as: NITROSTAT DISSOLVE 1 TAB UNDER TONGUE AT ONSET OF CHEST PAIN. REPEAT EVERY 5 MINUTES FOR 2 DOSES AS NEEDED What changed: See the new instructions.   prednisoLONE acetate 1 %  ophthalmic suspension Commonly known as: PRED FORTE Place 1 drop into both eyes daily.   Restasis 0.05 % ophthalmic emulsion Generic drug: cycloSPORINE Place 1 drop into both eyes daily.   vitamin B-12 500 MCG tablet Commonly known as: CYANOCOBALAMIN Take 1 tablet (500 mcg total) by mouth daily. Start taking on: February 12, 2022        Discharge Exam: Danley Danker Weights   02/09/22 2134  Weight: 89 kg   HEENT:  [place]/AT, No thrush, no icterus CV:  RRR, no rub, no S3, no S4 Lung:  CTA, no wheeze, no rhonchi Abd:  soft/+BS, NT Ext:  No edema, no lymphangitis, no synovitis, no rash   Condition at discharge: stable  The results of significant diagnostics from this hospitalization (including imaging, microbiology, ancillary and laboratory) are listed below for reference.   Imaging Studies: CT HEAD WO CONTRAST (5MM)  Result Date: 02/10/2022 CLINICAL DATA:  Delerium, weakness and dizziness. EXAM: CT HEAD WITHOUT CONTRAST TECHNIQUE: Contiguous axial images were obtained from the base of the skull through the vertex without intravenous contrast. RADIATION DOSE REDUCTION: This exam was performed according to the departmental dose-optimization program which includes automated exposure control, adjustment of the mA and/or kV according to patient size and/or use of iterative reconstruction technique. COMPARISON:  None Available. FINDINGS: Brain: There is mild-to-moderate cerebral  atrophy and atrophic ventriculomegaly with mild small-vessel disease of the cerebral white matter, slight cerebellar atrophy. No focal asymmetry is seen concerning for an acute cortical based infarct, hemorrhage, mass or midline shift. There are scattered dural calcifications in the frontal falx. Basal cisterns are clear. Vascular: There are patchy calcifications in the carotid siphons but no hyperdense central vasculature. Skull: No fracture or focal lesion is seen. Sinuses/Orbits: No acute finding. Other: None. IMPRESSION:  Atrophy and small-vessel disease with no acute intracranial CT findings. Carotid atherosclerosis. Electronically Signed   By: Telford Nab M.D.   On: 02/10/2022 03:26   CT Abdomen W Contrast  Result Date: 02/10/2022 CLINICAL DATA:  Weight loss, abdominal bloating, diarrhea EXAM: CT ABDOMEN WITH CONTRAST TECHNIQUE: Multidetector CT imaging of the abdomen was performed using the standard protocol following bolus administration of intravenous contrast. RADIATION DOSE REDUCTION: This exam was performed according to the departmental dose-optimization program which includes automated exposure control, adjustment of the mA and/or kV according to patient size and/or use of iterative reconstruction technique. CONTRAST:  61m OMNIPAQUE IOHEXOL 300 MG/ML  SOLN COMPARISON:  CT abdomen/pelvis dated 05/28/2016 FINDINGS: Lower chest: Mild patchy opacities at the lung bases, favoring post infectious inflammatory scarring. Hepatobiliary: Hepatic steatosis. Gallbladder is unremarkable. No intrahepatic or extrahepatic duct dilatation. Pancreas: Within normal limits. Spleen: Within normal limits. Adrenals/Urinary Tract: Adrenal glands are within normal limits. Kidneys are within normal limits.  No hydronephrosis. Stomach/Bowel: Stomach is within normal limits. Visualized bowel is unremarkable. Vascular/Lymphatic: No evidence of abdominal aortic aneurysm. Atherosclerotic calcifications of the abdominal aorta and branch vessels. No suspicious abdominal lymphadenopathy. Other: No abdominal ascites. Musculoskeletal: Mild degenerative changes of the visualized thoracolumbar spine. IMPRESSION: Hepatic steatosis. Otherwise negative CT abdomen. Please note that the pelvis was not imaged. Mild patchy opacities at the lung bases, favoring post infectious/inflammatory scarring. Electronically Signed   By: SJulian HyM.D.   On: 02/10/2022 01:38   [place]NAL  Result Date: 01/31/2022 CLINICAL DATA:  Chronic kidney disease EXAM: RENAL  / URINARY TRACT ULTRASOUND COMPLETE COMPARISON:  CT abdomen 05/28/2016 FINDINGS: Right Kidney: Renal measurements: 10.1 x 5.8 x 5.1 cm = volume: 163.9 mL. Echogenicity within normal limits. No mass or hydronephrosis visualized. Left Kidney: Renal measurements: 10.4 x 5.7 x 5 cm = volume: 154.43 mL. Echogenicity within normal limits. Two left renal cysts measuring 18 mm in the interpolar aspect and 14 mm upper pole respectively. No solid mass or hydronephrosis visualized. Bladder: Appears normal for degree of bladder distention. Other: None. IMPRESSION: 1. No obstructive uropathy. 2. Two small left renal cysts. Electronically Signed   By: HKathreen DevoidM.D.   On: 01/31/2022 06:46   DG Abdomen Acute W/Chest  Result Date: 02/10/2022 CLINICAL DATA:  Abdominal pain/distension after drinking oral contrast for routine CT EXAM: DG ABDOMEN ACUTE WITH 1 VIEW CHEST COMPARISON:  CT abdomen dated 02/09/2022 FINDINGS: Mild platelike scarring/atelectasis in the right mid lung. Left lung is clear. No pleural effusion or pneumothorax. The heart is normal in size. Nonobstructive bowel gas pattern. No evidence of free air under the diaphragm on the upright view. Right hip arthroplasty. Excretory contrast in the bladder. IMPRESSION: Negative abdominal radiographs.  No acute cardiopulmonary disease. Electronically Signed   By: SJulian HyM.D.   On: 02/10/2022 01:35   [place]domen Limited RUQ (LIVER/GB)  Result Date: 02/10/2022 CLINICAL DATA:  Elevated liver function tests for 3 months. EXAM: ULTRASOUND ABDOMEN LIMITED RIGHT UPPER QUADRANT COMPARISON:  02/09/2022 CT FINDINGS: Gallbladder: No gallstones or wall thickening  visualized. No sonographic Murphy sign noted by sonographer. Common bile duct: Diameter: Normal, 5 mm. Liver: Moderate to markedly increased hepatic echogenicity. Portal vein is patent on color Doppler imaging with normal direction of blood flow towards the liver. Other: None. IMPRESSION: Hepatic steatosis.  No other explanation for elevated liver function tests. Electronically Signed   By: Abigail Miyamoto M.D.   On: 02/10/2022 12:46    Microbiology: Results for orders placed or performed during the hospital encounter of 02/10/22  C Difficile Quick Screen w PCR reflex     Status: None   Collection Time: 02/10/22  5:41 PM   Specimen: STOOL  Result Value Ref Range Status   C Diff antigen NEGATIVE NEGATIVE Final   C Diff toxin NEGATIVE NEGATIVE Final   C Diff interpretation No C. difficile detected.  Final    Comment: Performed at [facility], [address] [place], [place] [address]  Gastrointestinal Panel by PCR , Stool     Status: None   Collection Time: 02/10/22  5:41 PM   Specimen: Stool  Result Value Ref Range Status   Campylobacter species NOT DETECTED NOT DETECTED Final   Plesimonas shigelloides NOT DETECTED NOT DETECTED Final   Salmonella species NOT DETECTED NOT DETECTED Final   Yersinia enterocolitica NOT DETECTED NOT DETECTED Final   Vibrio species NOT DETECTED NOT DETECTED Final   Vibrio cholerae NOT DETECTED NOT DETECTED Final   Enteroaggregative E coli (EAEC) NOT DETECTED NOT DETECTED Final   Enteropathogenic E coli (EPEC) NOT DETECTED NOT DETECTED Final   Enterotoxigenic E coli (ETEC) NOT DETECTED NOT DETECTED Final   Shiga like toxin producing E coli (STEC) NOT DETECTED NOT DETECTED Final   Shigella/Enteroinvasive E coli (EIEC) NOT DETECTED NOT DETECTED Final   Cryptosporidium NOT DETECTED NOT DETECTED Final   Cyclospora cayetanensis NOT DETECTED NOT DETECTED Final   Entamoeba histolytica NOT DETECTED NOT DETECTED Final   Giardia lamblia NOT DETECTED NOT DETECTED Final   Adenovirus F40/41 NOT DETECTED NOT DETECTED Final   Astrovirus NOT DETECTED NOT DETECTED Final   Norovirus GI/GII NOT DETECTED NOT DETECTED Final   Rotavirus A NOT DETECTED NOT DETECTED Final   Sapovirus (I, II, IV, and V) NOT DETECTED NOT DETECTED Final    Comment: Performed at [facility],  [address] [place], [place] [address]    Labs: CBC: Recent Labs  Lab 02/09/22 2158 02/10/22 0617 02/11/22 0529  WBC 4.7 3.0* 2.3*  HGB 11.9* 11.1* 10.0*  HCT 38.5* 34.0* 31.4*  MCV 99.7 96.3 99.1  PLT 78* 54* 45*   Basic Metabolic Panel: Recent Labs  Lab 02/09/22 1312 02/09/22 2158 02/10/22 0617 02/11/22 0529  NA  --  135 135 136  K  --  5.6* 4.3 3.7  CL  --  102 108 107  CO2  --  20* 20* 19*  GLUCOSE  --  225* 141* 75  BUN  --  40* 41* 32*  CREATININE 2.10* 1.91* 1.76* 1.62*  CALCIUM  --  8.8* 8.5* 8.4*  MG  --   --  1.4*  --   PHOS  --   --  2.5  --    Liver Function Tests: Recent Labs  Lab 02/09/22 2158 02/11/22 0529  AST 100* 73*  ALT 51* 37  ALKPHOS 54 44  BILITOT 1.2 0.9  PROT 7.2 6.0*  ALBUMIN 4.1 3.3*   CBG: Recent Labs  Lab 02/10/22 0708 02/10/22 1057 02/10/22 1619 02/10/22 2127 02/11/22 0719  GLUCAP 125*  148* 154* 105* 73    Discharge time spent: greater than 30 minutes.  Signed: Orson Eva, MD [facility] 02/11/2022

## 2022-02-11 NOTE — Care Management Obs Status (Signed)
[place] NOTIFICATION   Patient Details  Name: [name] Foster MRN: [id number] Date of Birth: [birth year]   Medicare Observation Status Notification Given:  Yes    Tommy Medal 02/11/2022, 11:16 AM

## 2022-02-12 ENCOUNTER — Telehealth: Payer: Self-pay

## 2022-02-12 ENCOUNTER — Other Ambulatory Visit: Payer: Self-pay

## 2022-02-12 ENCOUNTER — Encounter (HOSPITAL_COMMUNITY): Payer: Self-pay | Admitting: Physician Assistant

## 2022-02-12 ENCOUNTER — Inpatient Hospital Stay (HOSPITAL_COMMUNITY): Payer: Medicare Other | Attending: Hematology | Admitting: Physician Assistant

## 2022-02-12 DIAGNOSIS — I252 Old myocardial infarction: Secondary | ICD-10-CM | POA: Insufficient documentation

## 2022-02-12 DIAGNOSIS — D696 Thrombocytopenia, unspecified: Secondary | ICD-10-CM | POA: Insufficient documentation

## 2022-02-12 DIAGNOSIS — Z87891 Personal history of nicotine dependence: Secondary | ICD-10-CM | POA: Insufficient documentation

## 2022-02-12 DIAGNOSIS — E538 Deficiency of other specified B group vitamins: Secondary | ICD-10-CM

## 2022-02-12 NOTE — Telephone Encounter (Signed)
Transition Care Management Unsuccessful Follow-up Telephone Call  Date of discharge and from where:  02/11/22 - [name] - acute [facility] failure  Attempts:  1st Attempt  Reason for unsuccessful TCM follow-up call:  Left voice message

## 2022-02-15 NOTE — Telephone Encounter (Signed)
Transition Care Management Unsuccessful Follow-up Telephone Call  Date of discharge and from where:  02/11/22 [facility] - [facility]  Attempts:  2nd Attempt  Reason for unsuccessful TCM follow-up call:  Left voice message

## 2022-02-16 ENCOUNTER — Ambulatory Visit: Payer: Medicare Other | Admitting: Pharmacist

## 2022-02-19 NOTE — Telephone Encounter (Signed)
Transition Care Management Unsuccessful Follow-up Telephone Call  Date of discharge and from where:  11/11/21 - [name] - acute renal failure  Attempts:  3rd Attempt  Reason for unsuccessful TCM follow-up call:  Unable to reach patient

## 2022-02-20 ENCOUNTER — Other Ambulatory Visit: Payer: Self-pay | Admitting: Family Medicine

## 2022-02-20 DIAGNOSIS — I10 Essential (primary) hypertension: Secondary | ICD-10-CM

## 2022-02-20 DIAGNOSIS — I2510 Atherosclerotic heart disease of native coronary artery without angina pectoris: Secondary | ICD-10-CM

## 2022-02-26 ENCOUNTER — Other Ambulatory Visit: Payer: Self-pay | Admitting: Family Medicine

## 2022-02-26 DIAGNOSIS — R630 Anorexia: Secondary | ICD-10-CM

## 2022-02-26 DIAGNOSIS — F4321 Adjustment disorder with depressed mood: Secondary | ICD-10-CM

## 2022-03-02 ENCOUNTER — Telehealth: Payer: Self-pay | Admitting: Family Medicine

## 2022-03-02 NOTE — Telephone Encounter (Signed)
Patient states that he was supposed to call back and make a hospital follow up appt. States that he is out of town and can't come in until second week of July. Appt made and patient verbalized understanding

## 2022-03-11 ENCOUNTER — Ambulatory Visit: Payer: Medicare Other | Admitting: Pharmacist

## 2022-03-12 ENCOUNTER — Inpatient Hospital Stay (HOSPITAL_COMMUNITY): Payer: Medicare Other

## 2022-03-12 DIAGNOSIS — D696 Thrombocytopenia, unspecified: Secondary | ICD-10-CM

## 2022-03-12 DIAGNOSIS — E538 Deficiency of other specified B group vitamins: Secondary | ICD-10-CM

## 2022-03-12 DIAGNOSIS — I252 Old myocardial infarction: Secondary | ICD-10-CM | POA: Diagnosis not present

## 2022-03-12 DIAGNOSIS — Z87891 Personal history of nicotine dependence: Secondary | ICD-10-CM | POA: Diagnosis not present

## 2022-03-12 LAB — CBC WITH DIFFERENTIAL/PLATELET
Abs Immature Granulocytes: 0.05 10*3/uL (ref 0.00–0.07)
Basophils Absolute: 0 10*3/uL (ref 0.0–0.1)
Basophils Relative: 0 %
Eosinophils Absolute: 0.1 10*3/uL (ref 0.0–0.5)
Eosinophils Relative: 3 %
HCT: 37.5 % — ABNORMAL LOW (ref 39.0–52.0)
Hemoglobin: 12 g/dL — ABNORMAL LOW (ref 13.0–17.0)
Immature Granulocytes: 1 %
Lymphocytes Relative: 30 %
Lymphs Abs: 1.3 10*3/uL (ref 0.7–4.0)
MCH: 31.1 pg (ref 26.0–34.0)
MCHC: 32 g/dL (ref 30.0–36.0)
MCV: 97.2 fL (ref 80.0–100.0)
Monocytes Absolute: 0.4 10*3/uL (ref 0.1–1.0)
Monocytes Relative: 10 %
Neutro Abs: 2.5 10*3/uL (ref 1.7–7.7)
Neutrophils Relative %: 56 %
Platelets: 116 10*3/uL — ABNORMAL LOW (ref 150–400)
RBC: 3.86 MIL/uL — ABNORMAL LOW (ref 4.22–5.81)
RDW: 15.1 % (ref 11.5–15.5)
WBC: 4.4 10*3/uL (ref 4.0–10.5)
nRBC: 0 % (ref 0.0–0.2)

## 2022-03-18 ENCOUNTER — Ambulatory Visit: Payer: Medicare Other | Admitting: Family Medicine

## 2022-04-04 ENCOUNTER — Other Ambulatory Visit: Payer: Self-pay | Admitting: Family Medicine

## 2022-04-04 DIAGNOSIS — I10 Essential (primary) hypertension: Secondary | ICD-10-CM

## 2022-04-04 DIAGNOSIS — E785 Hyperlipidemia, unspecified: Secondary | ICD-10-CM

## 2022-04-14 ENCOUNTER — Encounter: Payer: Self-pay | Admitting: *Deleted

## 2022-04-23 ENCOUNTER — Other Ambulatory Visit: Payer: Self-pay | Admitting: Family Medicine

## 2022-04-23 DIAGNOSIS — I10 Essential (primary) hypertension: Secondary | ICD-10-CM

## 2022-04-23 DIAGNOSIS — I2510 Atherosclerotic heart disease of native coronary artery without angina pectoris: Secondary | ICD-10-CM

## 2022-05-10 ENCOUNTER — Ambulatory Visit (INDEPENDENT_AMBULATORY_CARE_PROVIDER_SITE_OTHER): Payer: Medicare Other

## 2022-05-10 VITALS — Wt 196.0 lb

## 2022-05-10 DIAGNOSIS — Z0000 Encounter for general adult medical examination without abnormal findings: Secondary | ICD-10-CM | POA: Diagnosis not present

## 2022-05-10 NOTE — Progress Notes (Signed)
Subjective:   [name] Foster is a 66 y.o. male who presents for Medicare Annual/Subsequent preventive examination.  Virtual Visit via Telephone Note  I connected with  [name] Foster on 05/10/22 at  2:45 PM EDT by telephone and verified that I am speaking with the correct person using two identifiers.  Location: Patient: Home Provider: [facility] Persons participating in the virtual visit: patient/Nurse Health Advisor   I discussed the limitations, risks, security and privacy concerns of performing an evaluation and management service by telephone and the availability of in person appointments. The patient expressed understanding and agreed to proceed.  Interactive audio and video telecommunications were attempted between this nurse and patient, however failed, due to patient having technical difficulties OR patient did not have access to video capability.  We continued and completed visit with audio only.  Some vital signs may be absent or patient reported.   Jarvis Knodel E Cipriana Biller, LPN   Review of Systems     Cardiac Risk Factors include: advanced age (>47mn, >>83women);diabetes mellitus;dyslipidemia;hypertension;male gender;sedentary lifestyle;Other (see comment), Risk factor comments: CAD, OSA-mild, no CPAP, hx of MI, alcohol abuse     Objective:    Today's Vitals   05/10/22 1443  Weight: 196 lb (88.9 kg)   Body mass index is 29.8 kg/m.     05/10/2022    2:51 PM 02/12/2022   12:04 PM 02/10/2022    4:38 AM 02/09/2022    9:35 PM 01/29/2022   11:29 AM 01/14/2022    9:57 AM 01/12/2022    1:18 PM  Advanced Directives  Does Patient Have a Medical Advance Directive? No No No No No No No  Would patient like information on creating a medical advance directive? No - Patient declined No - Patient declined No - Patient declined  No - Patient declined No - Patient declined No - Patient declined    Current Medications (verified) Outpatient Encounter Medications as of 05/10/2022  Medication Sig    albuterol (VENTOLIN HFA) 108 (90 Base) MCG/ACT inhaler TAKE 2 PUFFS BY MOUTH EVERY 6 HOURS AS NEEDED FOR WHEEZE OR SHORTNESS OF BREATH (Patient taking differently: Inhale 1-2 puffs into the lungs every 6 (six) hours as needed for wheezing or shortness of breath.)   amLODipine (NORVASC) 5 MG tablet TAKE 1 TABLET (5 MG TOTAL) BY MOUTH DAILY.   atorvastatin (LIPITOR) 40 MG tablet TAKE 1 TABLET BY MOUTH DAILY AT 6 PM.   clopidogrel (PLAVIX) 75 MG tablet Take 1 tablet (75 mg total) by mouth daily.   Coenzyme Q10 (COQ-10) 100 MG CAPS Take 100 mg by mouth daily.   dapagliflozin propanediol (FARXIGA) 10 MG TABS tablet Take 1 tablet (10 mg total) by mouth daily before breakfast.   feeding supplement (ENSURE ENLIVE / ENSURE PLUS) LIQD Take 237 mLs by mouth 2 (two) times daily between meals.   folic acid (FOLVITE) 1 MG tablet Take 1 tablet (1 mg total) by mouth daily.   hydrALAZINE (APRESOLINE) 50 MG tablet Take 1 tablet (50 mg total) by mouth every 8 (eight) hours. (Patient taking differently: Take 50 mg by mouth in the morning and at bedtime.)   hydrochlorothiazide (HYDRODIURIL) 12.5 MG tablet Take 1 tablet (12.5 mg total) by mouth daily. (NEEDS TO BE SEEN BEFORE NEXT REFILL) (Patient taking differently: Take 12.5 mg by mouth daily.)   isosorbide mononitrate (IMDUR) 30 MG 24 hr tablet TAKE 1/2 OF A TABLET (15 MG TOTAL) BY MOUTH DAILY   metoprolol tartrate (LOPRESSOR) 50 MG tablet Take 1  tablet (50 mg total) by mouth 2 (two) times daily.   mirtazapine (REMERON) 7.5 MG tablet TAKE 1 TABLET BY MOUTH EVERYDAY AT BEDTIME   nitroGLYCERIN (NITROSTAT) 0.4 MG SL tablet DISSOLVE 1 TAB UNDER TONGUE AT ONSET OF CHEST PAIN. REPEAT EVERY 5 MINUTES FOR 2 DOSES AS NEEDED (Patient taking differently: Place 0.4 mg under the tongue every 5 (five) minutes as needed for chest pain.)   prednisoLONE acetate (PRED FORTE) 1 % ophthalmic suspension Place 1 drop into both eyes daily.   RESTASIS 0.05 % ophthalmic emulsion Place 1 drop  into both eyes daily.   Simethicone (GAS-X PO) Take 1 tablet by mouth daily.   vitamin B-12 (CYANOCOBALAMIN) 500 MCG tablet Take 1 tablet (500 mcg total) by mouth daily.   No facility-administered encounter medications on file as of 05/10/2022.    Allergies (verified) Patient has no known allergies.   History: Past Medical History:  Diagnosis Date   Arthritis    CAD (coronary artery disease)    a. 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 DES to left PDA 09/20/17 // c. 10/19: LCx and LPDA stents ok; s/p DES to pLAD and POBA to D1   Diabetes mellitus without complication ([place])    type 2    Diabetic nephropathy ([place])    Echocardiogram 08/2021    Echocardiogram 12/22: EF 60-65, no RWMA, GLS -19.4% (normal), normal RVSF, trivial MR, mild dilation of ascending aorta (37 mm)   Heart attack ([place])    " a blood clot is actually what caused my heart attack in 2018 " , he states he is unsure where the blood clot originated from    Hyperlipidemia    Hypertension    Insomnia    Pulmonary hypertension ([place])    a. TTE 03/25/17; EF 60-65%, nl WM, LV diastolic fxn nl, trivial AI, RV cavity size, wall thickness, and sys fxn nl, trivial TR, PASP 39 mmHg, trivial pericardial effusion // Echo 12/22: normal RVSP, PASP   SCCA (squamous cell carcinoma) of skin 03/26/2020   Mid Parietal Scalp (well diff)   Sleep apnea    could not use a cpap   Vitamin B12 deficiency 05/03/2020   Wears dentures    full top-partial bottom   Wears glasses    Past Surgical History:  Procedure Laterality Date   COLONOSCOPY  05/13/2010   Single anal papilla, 2 diminutive polyps at 10 cm, left-sided diverticula, one sigmoid and one a sending polyp, otherwise normal colon and TI.  Path with 1 tubular adenoma, otherwise hyperplastic polyps.  Recommendations repeat colonoscopy in 7 years.   COLONOSCOPY WITH  PROPOFOL N/A 11/22/2019   Rourk, Cristopher Estimable, MD -  ten 4-9 mm polyps in the rectum, sigmoid colon and cecum were removed, three 12-25 mm polyps at the splenic flexure and hepatic flexure removed with clip placement to the largest polyp   COLONOSCOPY WITH PROPOFOL N/A 01/14/2022   Procedure: COLONOSCOPY WITH PROPOFOL;  Surgeon: Daneil Dolin, MD;  [facility];  Service: Endoscopy;  Laterality: N/A;  11:15am, asa 3   CORONARY ATHERECTOMY N/A 07/03/2018   Procedure: CORONARY ATHERECTOMY;  Surgeon: Burnell Blanks, MD;  Location: [facility] [facility];  Service: Cardiovascular;  Laterality: N/A;   CORONARY STENT INTERVENTION N/A 09/20/2017   Procedure: CORONARY STENT INTERVENTION;  Surgeon: Leonie Man, MD;  Location: [place]  [facility];  Service: Cardiovascular;  Laterality: N/A;   CORONARY STENT INTERVENTION N/A 07/03/2018   Procedure: CORONARY STENT INTERVENTION;  Surgeon: Burnell Blanks, MD;  Location: [place] [facility];  Service: Cardiovascular;  Laterality: N/A;   CORONARY/GRAFT ACUTE MI REVASCULARIZATION N/A 03/25/2017   Procedure: Coronary/Graft Acute MI Revascularization;  Surgeon: Sherren Mocha, MD;  Location: [facility] [facility];  Service: Cardiovascular;  Laterality: N/A;   GANGLION CYST EXCISION Left 07/25/2014   Procedure: EXCISION VOLAR AND DORSAL GANGLION LEFT WRIST;  Surgeon: Leanora Cover, MD;  Location: [facility];  Service: Orthopedics;  Laterality: Left;   INTRAVASCULAR PRESSURE WIRE/FFR STUDY N/A 09/20/2017   Procedure: INTRAVASCULAR PRESSURE WIRE/FFR STUDY;  Surgeon: Leonie Man, MD;  Location: [facility];  Service: Cardiovascular;  Laterality: N/A;   INTRAVASCULAR PRESSURE WIRE/FFR STUDY N/A 06/30/2018   Procedure: INTRAVASCULAR PRESSURE WIRE/FFR STUDY;  Surgeon: Nelva Bush, MD;  Location: [place] [facility];  Service: Cardiovascular;  Laterality: N/A;   KNEE ARTHROSCOPY     rifgr and left   LEFT HEART CATH AND  CORONARY ANGIOGRAPHY N/A 03/25/2017   Procedure: Left Heart Cath and Coronary Angiography;  Surgeon: Sherren Mocha, MD;  Location: [facility];  Service: Cardiovascular;  Laterality: N/A;   LEFT HEART CATH AND CORONARY ANGIOGRAPHY N/A 09/20/2017   Procedure: LEFT HEART CATH AND CORONARY ANGIOGRAPHY;  Surgeon: Leonie Man, MD;  Location: [facility];  Service: Cardiovascular;  Laterality: N/A;   LEFT HEART CATH AND CORONARY ANGIOGRAPHY N/A 06/30/2018   Procedure: LEFT HEART CATH AND CORONARY ANGIOGRAPHY;  Surgeon: Nelva Bush, MD;  Location: [facility];  Service: Cardiovascular;  Laterality: N/A;   POLYPECTOMY  11/22/2019   Procedure: POLYPECTOMY;  Surgeon: Daneil Dolin, MD;  [facility];  Service: Endoscopy;;   POLYPECTOMY  01/14/2022   Procedure: POLYPECTOMY;  Surgeon: Daneil Dolin, MD;  [facility];  Service: Endoscopy;;   SHOULDER ARTHROSCOPY  2012   left   SHOULDER ARTHROSCOPY     right   TEMPORARY PACEMAKER N/A 03/25/2017   Procedure: Temporary Pacemaker;  Surgeon: Sherren Mocha, MD;  Location: [facility];  Service: Cardiovascular;  Laterality: N/A;   TONSILLECTOMY     TOTAL HIP ARTHROPLASTY Right 04/25/2019   TOTAL HIP ARTHROPLASTY Right 04/25/2019   Procedure: TOTAL HIP ARTHROPLASTY ANTERIOR APPROACH;  Surgeon: Rod Can, MD;  Location[facility];  Service: Orthopedics;  Laterality: Right;   TOTAL KNEE ARTHROPLASTY  2002   left   Family History  Problem Relation Age of Onset   Hypertension Mother    Hypertension Father    Hypertension Brother    Heart disease Maternal Grandfather    Colon cancer Neg Hx    Social History   Socioeconomic History   Marital status: Widowed    Spouse name: Not on file   Number of children: 2   Years of education: Not on file   Highest education level: Not on file  Occupational History   Occupation: Retired  Tobacco Use   Smoking status: Former    Packs/day: 2.00     Years: 30.00    Total pack years: 60.00    Types: Cigarettes    Quit date: 03/25/2017    Years since quitting: 5.1   Smokeless tobacco: Never  Vaping Use   Vaping Use: Never used  Substance and Sexual Activity   Alcohol use: Yes    Comment: 3-6 shots of liquor  daily   Drug use: Not Currently    Types: Marijuana    Comment: last used about 10 years ago (08/23/19)   Sexual activity: Not Currently  Other Topics Concern   Not on file  Social History Narrative   Per wife, pt was preparing to retire soon from his job at [employer].   2021/05/21 - wife passed away a couple weeks ago - he lives alone, but has lot of family nearby   Son next door, daughter lives in [place] Strain: [place]  (05/10/2022)   Overall Financial Resource Strain (CARDIA)    Difficulty of Paying Living Expenses: Not hard at all  Food Insecurity: No [place] (05/10/2022)   Hunger Vital Sign    Worried About Running Out of Food in the Last Year: Never true    [place] in the Last Year: Never true  Transportation Needs: No Transportation Needs (05/10/2022)   PRAPARE - Hydrologist (Medical): No    Lack of Transportation (Non-Medical): No  Physical Activity: Insufficiently Active (05/10/2022)   Exercise Vital Sign    Days of Exercise per Week: 7 days    Minutes of Exercise per Session: 20 min  Stress: No Stress Concern Present (05/10/2022)   [place]    Feeling of Stress : Only a little  Social Connections: Moderately Isolated (05/10/2022)   Social Connection and Isolation Panel [NHANES]    Frequency of Communication with Friends and Family: More than three times a week    Frequency of Social Gatherings with Friends and Family: More than three times a week    Attends Religious Services: Never    Marine scientist or Organizations: Yes    Attends Programme researcher, broadcasting/film/video: More than 4 times per year    Marital Status: Widowed    Tobacco Counseling Counseling given: Not Answered   Clinical Intake:  Pre-visit preparation completed: Yes  Pain : No/denies pain     BMI - recorded: 29.8 Nutritional Status: BMI 25 -29 Overweight Nutritional Risks: None Diabetes: No  How often do you need to have someone help you when you read instructions, pamphlets, or other written materials from your doctor or pharmacy?: 1 - Never  Diabetic? Nutrition Risk Assessment:  Has the patient had any N/V/D within the last 2 months?  No  Does the patient have any non-healing wounds?  No  Has the patient had any unintentional weight loss or weight gain?  No   Diabetes:  Is the patient diabetic?  Yes  If diabetic, was a CBG obtained today?  No  Did the patient bring in their glucometer from home?  No  How often do you monitor your CBG's? never.   Financial Strains and Diabetes Management:  Are you having any financial strains with the device, your supplies or your medication? No .  Does the patient want to be seen by Chronic Care Management for management of their diabetes?  No  Would the patient like to be referred to a Nutritionist or for Diabetic Management?  No   Diabetic Exams:  Diabetic Eye Exam: Completed 01/2022 per patient - we need record.  Diabetic Foot Exam: Completed 05/06/2021. Pt has been advised about the importance in completing this exam. Pt is scheduled for diabetic foot exam on 06/14/2022.    Interpreter Needed?: No  Information entered by ::  Jalaiya Oyster, LPN   Activities of Daily Living    05/10/2022    2:51 PM 02/10/2022    5:18 AM  In your present state of health, do you have any difficulty performing the following activities:  Hearing? 0 0  Vision? 0 0  Difficulty concentrating or making decisions? 0 0  Walking or climbing stairs? 0 0  Dressing or bathing? 0 0  Doing errands, shopping? 0   Preparing Food and  eating ? N   Using the Toilet? N   In the past six months, have you accidently leaked urine? N   Do you have problems with loss of bowel control? N   Managing your Medications? N   Managing your Finances? N   Housekeeping or managing your Housekeeping? N     Patient Care Team: [name], FNP as PCP - General (Family Medicine) Sherren Mocha, MD as PCP - Cardiology (Cardiology) [facility] as Consulting Physician (Orthopedic Surgery) Lavonna Monarch, MD as Consulting Physician (Dermatology) Sharmon Revere as Physician Assistant (Cardiology) Harlen Labs, MD as Referring Physician (Optometry) Lavera Guise, [facility] as Pharmacist (Family Medicine) Derek Jack, MD as Medical Oncologist (Hematology)  Indicate any recent Medical Services you may have received from other than [facility] providers in the past year (date may be approximate).     Assessment:   This is a routine wellness examination for [place].  Hearing/Vision screen Hearing Screening - Comments:: Denies hearing difficulties   Vision Screening - Comments:: Wears rx glasses - up to date with routine eye exams with [facility] in [place]  Dietary issues and exercise activities discussed: Current Exercise Habits: Home exercise routine, Type of exercise: walking, Time (Minutes): 20, Frequency (Times/Week): 7, Weekly Exercise (Minutes/Week): 140, Intensity: Mild, Exercise limited by: cardiac condition(s);orthopedic condition(s)   Goals Addressed             This Visit's Progress    Patient Stated   Not on track    Try to cut back and eventually quit drinking alcohol. Get back to [place] meetings regularly       Depression Screen    05/10/2022    3:06 PM 12/10/2021    1:36 PM 05/07/2021    2:25 PM 05/06/2021   11:55 AM 04/21/2021    4:27 PM 02/03/2021   10:33 AM 08/04/2020   10:04 AM  PHQ 2/9 Scores  PHQ - 2 Score 2 0 '3 6 4 '$ 0 0  PHQ- 9 Score '4 6 11 17 9 2 '$ 0    Fall Risk    05/10/2022    2:47  PM 12/10/2021    1:36 PM 05/07/2021    2:17 PM 05/06/2021   11:55 AM 04/21/2021    4:27 PM  Fall Risk   Falls in the past year? 0 0 '1 1 1  '$ Number falls in past yr: 0  0 0 0  Injury with Fall? 0  0 0 0  Risk for fall due to : Orthopedic patient  Impaired vision;Medication side effect;Orthopedic patient    Follow up Falls prevention discussed  Education provided;Falls prevention discussed Falls prevention discussed Falls prevention discussed    FALL RISK PREVENTION PERTAINING TO THE HOME:  Any stairs in or around the home? No  If so, are there any without handrails? No  Home free of loose throw rugs in walkways, pet beds, electrical cords, etc? Yes  Adequate lighting in your home to reduce risk of falls? Yes   ASSISTIVE DEVICES  UTILIZED TO PREVENT FALLS:  Life alert? No  Use of a cane, walker or w/c? No  Grab bars in the bathroom? Yes  Shower chair or bench in shower? No  Elevated toilet seat or a handicapped toilet? No   TIMED UP AND GO:  Was the test performed? No .   Cognitive Function:        05/10/2022    2:53 PM 05/07/2021    2:23 PM  6CIT Screen  What Year? 0 points 0 points  What month? 0 points 0 points  What time? 0 points 0 points  Count back from 20 0 points 0 points  Months in reverse 4 points 2 points  Repeat phrase 0 points 0 points  Total Score 4 points 2 points    Immunizations Immunization History  Administered Date(s) Administered   Fluad Quad(high Dose 65+) 07/19/2019   Hep A / Hep B 10/30/2019, 11/27/2019, 04/29/2020   Influenza,inj,Quad PF,6+ Mos 07/01/2018   Influenza-Unspecified 06/13/2018, 07/18/2020, 06/18/2021   Moderna Sars-Covid-2 Vaccination 12/21/2019, 01/21/2020, 08/30/2020   PNEUMOCOCCAL CONJUGATE-20 08/11/2021   Pneumococcal Conjugate-13 05/02/2020   Tdap 04/11/2019   Zoster Recombinat (Shingrix) 05/29/2019, 08/21/2019    TDAP status: Up to date  Flu Vaccine status: Up to date  Pneumococcal vaccine status: Up to  date  Covid-19 vaccine status: Completed vaccines  Qualifies for Shingles Vaccine? Yes   Zostavax completed Yes   Shingrix Completed?: Yes  Screening Tests Health Maintenance  Topic Date Due   OPHTHALMOLOGY EXAM  11/20/2019   COVID-19 Vaccine (4 - Moderna risk series) 10/25/2020   INFLUENZA VACCINE  04/13/2022   FOOT EXAM  05/06/2022   HEMOGLOBIN A1C  08/12/2022   COLONOSCOPY (Pts 45-11yr Insurance coverage will need to be confirmed)  01/15/2023   TETANUS/TDAP  04/10/2029   Pneumonia Vaccine 67 Years old  Completed   Hepatitis C Screening  Completed   Zoster Vaccines- Shingrix  Completed   HPV VACCINES  Aged Out    Health Maintenance  Health Maintenance Due  Topic Date Due   OPHTHALMOLOGY EXAM  11/20/2019   COVID-19 Vaccine (4 - Moderna risk series) 10/25/2020   INFLUENZA VACCINE  04/13/2022   FOOT EXAM  05/06/2022    Colorectal cancer screening: Type of screening: Colonoscopy. Completed 01/14/2022. Repeat every 1 years  Lung Cancer Screening: (Low Dose CT Chest recommended if Age 67-80years, 30 pack-year currently smoking OR have quit w/in 15years.) does not qualify.   Additional Screening:  Hepatitis C Screening: does qualify; Completed 01/29/2022  Vision Screening: Recommended annual ophthalmology exams for early detection of glaucoma and other disorders of the eye. Is the patient up to date with their annual eye exam?  Yes  Who is the provider or what is the name of the office in which the patient attends annual eye exams? [facility] is not established with a provider, would they like to be referred to a provider to establish care? No .   Dental Screening: Recommended annual dental exams for proper oral hygiene  Community Resource Referral / Chronic Care Management: CRR required this visit?  No   CCM required this visit?  No      Plan:     I have personally reviewed and noted the following in the patient's chart:   Medical and  social history Use of alcohol, tobacco or illicit drugs  Current medications and supplements including opioid prescriptions. Patient is not currently taking opioid prescriptions. Functional ability and status Nutritional status  Physical activity Advanced directives List of other physicians Hospitalizations, surgeries, and ER visits in previous 12 months Vitals Screenings to include cognitive, depression, and falls Referrals and appointments  In addition, I have reviewed and discussed with patient certain preventive protocols, quality metrics, and best practice recommendations. A written personalized care plan for preventive services as well as general preventive health recommendations were provided to patient.     Sandrea Hammond, LPN   8/81/1031   Nurse Notes: His neuropathy is getting worse in feet and legs, and now he has it in his hands.

## 2022-05-10 NOTE — Patient Instructions (Signed)
[name] Foster , Thank you for taking time to come for your Medicare Wellness Visit. I appreciate your ongoing commitment to your health goals. Please review the following plan we discussed and let me know if I can assist you in the future.   Screening recommendations/referrals: Colonoscopy: Done 01/14/2022 - Repeat in 1 year Recommended yearly ophthalmology/optometry visit for glaucoma screening and checkup Recommended yearly dental visit for hygiene and checkup  Vaccinations: Influenza vaccine: Done 06/18/2021 - Repeat annually  Pneumococcal vaccine: Done 05/02/2020 & 10/11/2020 Tdap vaccine: Done 04/11/2019 - Repeat in 10 years  Shingles vaccine: Done 05/29/2019 & 08/21/2019 Covid-19: Done  12/21/2019, 01/21/2020, & 08/30/2020  Advanced directives: Advance directive discussed with you today. Even though you declined this today, please call our office should you change your mind, and we can give you the proper paperwork for you to fill out.   Conditions/risks identified: Aim for 30 minutes of exercise or brisk walking, 6-8 glasses of water, and 5 servings of fruits and vegetables each day. Try to cut back on alcohol - consistently attend AA meetings.  Alcohol Misuse and Dependence Information, Adult Alcohol is a widely available drug and people choose to drink alcohol in different amounts. Alcohol misuse and dependence can have a negative effect on your life. Alcohol misuse is when you use alcohol too much or too often. You may have a hard time setting a limit on the amount you drink. Alcohol dependence is when you use alcohol consistently for a period of time, and your body changes as a result. Alcohol dependence can make it hard for you to stop drinking because you may start to feel sick or different when you do not drink alcohol. These symptoms are known as withdrawal. People who drink alcohol very often and in large amounts, may develop what is called an alcohol use disorder. How can alcohol misuse  and dependence affect me? Drinking too much can lead to addiction. You may feel like you need alcohol to function normally. You may drink alcohol before work in the morning, during the day, or as soon as you get home from work in the evening. These actions can result in: Poor work performance. Job loss. Financial problems. Car crashes or criminal charges from driving after drinking alcohol. Problems in your relationships with friends and family. Losing the trust and respect of coworkers, friends, and family. Drinking heavily over a long period of time can permanently damage your body and brain, and can cause lifelong health issues, such as: Damage to your liver or pancreas. Heart problems, high blood pressure, or stroke. Certain cancers. Decreased ability to fight infections. Brain or nerve damage. Depression. Early death, also called premature death. If you are careless or you crave alcohol, it is easy to drink more than your body can handle (overdose). Alcohol overdose is a serious situation that requires hospitalization. It may lead to permanent injuries or death. What can increase my risk? Having a family history of alcohol misuse. Having depression or other mental health conditions. Beginning to drink at an early age. Binge drinking often. Experiencing trauma, stress, and an unstable home life during childhood. Spending time with people who drink often. What actions can I take to prevent alcohol misuse and dependence? Do not drink alcohol if: Your health care provider tells you not to drink. You are pregnant, may be pregnant, or are planning to become pregnant. If you drink alcohol: Limit how much you have to: 0-1 drink a day for women who are not pregnant.  0-2 drinks a day for men. Know how much alcohol is in your drink. In the [place] one drink equals one 12 oz bottle of beer (355 mL), one 5 oz glass of wine (148 mL), or one 1 oz glass of hard liquor (44 mL). If you think you  have an alcohol dependency problem, decide to stop drinking. This can be very hard to do if you are used to frequently drinking alcohol. If you begin to have withdrawal symptoms, talk with your health care provider or a person that you trust. These symptoms may include anxiety, shaky hands, headache, nausea, sweating, or not being able to sleep. Choose to drink nonalcoholic beverages in social gatherings and places where there may be alcohol. Activity Spend more time on activities that you enjoy that do not involve alcohol, like hobbies or exercise. Find healthy ways to cope with stress, such as meditation or spending time with people you care about. General information Talk to your family, coworkers, and friends about supporting you in your efforts to stop drinking. If they drink, ask them not to drink around you. Spend more time with people who do not drink alcohol. If you think that you have an alcohol dependency problem: Tell friends or family about your concerns. Talk with your health care provider or another health professional about where to get help. Work with a Transport planner and a Regulatory affairs officer. Consider joining a support group for people who struggle with alcohol misuse and dependence. Where to find support  Your health care provider. SMART Recovery: smartrecovery.org Local treatment centers or chemical dependency counselors. Local AA groups in your community: ShedSizes.ch Where to find more information Centers for Disease Control and Prevention: StoreMirror.com.cy [employer] on Alcohol Abuse and Alcoholism: LinkVoyage.dk Alcoholics Anonymous (AA): ShedSizes.ch Contact a health care provider if: You drank more or for longer than you intended on more than one occasion. You often drink to the point of vomiting or passing out. You have problems in your life due to drinking, but you continue to drink. You keep drinking even though you feel anxious, depressed, or have experienced memory  loss. You have stopped doing the things you used to enjoy in order to drink. You have to drink more than you used to in order to get the effect you want. You experience anxiety, sweating, nausea, shakiness, and trouble sleeping when you try to stop drinking. Get help right away if: You have serious withdrawal symptoms, including: Confusion. Racing heart. High blood pressure. Fever. These symptoms may be an emergency. Get help right away. Call 911. Do not wait to see if the symptoms will go away. Do not drive yourself to the hospital. Also, get help right away if: You have thoughts about hurting yourself or others. Take one of these steps if you feel like you may hurt yourself or others, or have thoughts about taking your own life: Call 911. Call the [place] at (754) 445-8599 or 988. This is open 24 hours a day. Text the Crisis Text Line at (224)863-6394. Summary Alcohol misuse and dependence can have a negative effect on your life. Drinking too much or too often can lead to addiction. If you drink alcohol, limit how much you use. If you are having trouble keeping your drinking under control, find ways to change your behavior. Hobbies, calming activities, exercise, or support groups can help. If you feel you need help with changing your drinking habits, talk with your health care provider, a good friend, or a  therapist, or go to a support group. This information is not intended to replace advice given to you by your health care provider. Make sure you discuss any questions you have with your health care provider. Document Revised: 11/04/2021 Document Reviewed: 11/04/2021 Elsevier Patient Education  [place].   Next appointment: Follow up in one year for your annual wellness visit.   Preventive Care 72 Years and Older, Male  Preventive care refers to lifestyle choices and visits with your health care provider that can promote health and wellness. What  does preventive care include? A yearly physical exam. This is also called an annual well check. Dental exams once or twice a year. Routine eye exams. Ask your health care provider how often you should have your eyes checked. Personal lifestyle choices, including: Daily care of your teeth and gums. Regular physical activity. Eating a healthy diet. Avoiding tobacco and drug use. Limiting alcohol use. Practicing safe sex. Taking low doses of aspirin every day. Taking vitamin and mineral supplements as recommended by your health care provider. What happens during an annual well check? The services and screenings done by your health care provider during your annual well check will depend on your age, overall health, lifestyle risk factors, and family history of disease. Counseling  Your health care provider may ask you questions about your: Alcohol use. Tobacco use. Drug use. Emotional well-being. Home and relationship well-being. Sexual activity. Eating habits. History of falls. Memory and ability to understand (cognition). Work and work Statistician. Screening  You may have the following tests or measurements: Height, weight, and BMI. Blood pressure. Lipid and cholesterol levels. These may be checked every 5 years, or more frequently if you are over 4 years old. Skin check. Lung cancer screening. You may have this screening every year starting at age 66 if you have a 30-pack-year history of smoking and currently smoke or have quit within the past 15 years. Fecal occult blood test (FOBT) of the stool. You may have this test every year starting at age 3. Flexible sigmoidoscopy or colonoscopy. You may have a sigmoidoscopy every 5 years or a colonoscopy every 10 years starting at age 86. Prostate cancer screening. Recommendations will vary depending on your family history and other risks. Hepatitis C blood test. Hepatitis B blood test. Sexually transmitted disease (STD)  testing. Diabetes screening. This is done by checking your blood sugar (glucose) after you have not eaten for a while (fasting). You may have this done every 1-3 years. Abdominal aortic aneurysm (AAA) screening. You may need this if you are a current or former smoker. Osteoporosis. You may be screened starting at age 91 if you are at high risk. Talk with your health care provider about your test results, treatment options, and if necessary, the need for more tests. Vaccines  Your health care provider may recommend certain vaccines, such as: Influenza vaccine. This is recommended every year. Tetanus, diphtheria, and acellular pertussis (Tdap, Td) vaccine. You may need a Td booster every 10 years. Zoster vaccine. You may need this after age 87. Pneumococcal 13-valent conjugate (PCV13) vaccine. One dose is recommended after age 73. Pneumococcal polysaccharide (PPSV23) vaccine. One dose is recommended after age 85. Talk to your health care provider about which screenings and vaccines you need and how often you need them. This information is not intended to replace advice given to you by your health care provider. Make sure you discuss any questions you have with your health care provider. Document Released: 09/26/2015 Document  Revised: 05/19/2016 Document Reviewed: 07/01/2015 Elsevier Interactive Patient Education  2017 [place] Prevention in the Home Falls can cause injuries. They can happen to people of all ages. There are many things you can do to make your home safe and to help prevent falls. What can I do on the outside of my home? Regularly fix the edges of walkways and driveways and fix any cracks. Remove anything that might make you trip as you walk through a door, such as a raised step or threshold. Trim any bushes or trees on the path to your home. Use bright outdoor lighting. Clear any walking paths of anything that might make someone trip, such as rocks or  tools. Regularly check to see if handrails are loose or broken. Make sure that both sides of any steps have handrails. Any raised decks and porches should have guardrails on the edges. Have any leaves, snow, or ice cleared regularly. Use sand or salt on walking paths during winter. Clean up any spills in your garage right away. This includes oil or grease spills. What can I do in the bathroom? Use night lights. Install grab bars by the toilet and in the tub and shower. Do not use towel bars as grab bars. Use non-skid mats or decals in the tub or shower. If you need to sit down in the shower, use a plastic, non-slip stool. Keep the floor dry. Clean up any water that spills on the floor as soon as it happens. Remove soap buildup in the tub or shower regularly. Attach bath mats securely with double-sided non-slip rug tape. Do not have throw rugs and other things on the floor that can make you trip. What can I do in the bedroom? Use night lights. Make sure that you have a light by your bed that is easy to reach. Do not use any sheets or blankets that are too big for your bed. They should not hang down onto the floor. Have a firm chair that has side arms. You can use this for support while you get dressed. Do not have throw rugs and other things on the floor that can make you trip. What can I do in the kitchen? Clean up any spills right away. Avoid walking on wet floors. Keep items that you use a lot in easy-to-reach places. If you need to reach something above you, use a strong step stool that has a grab bar. Keep electrical cords out of the way. Do not use floor polish or wax that makes floors slippery. If you must use wax, use non-skid floor wax. Do not have throw rugs and other things on the floor that can make you trip. What can I do with my stairs? Do not leave any items on the stairs. Make sure that there are handrails on both sides of the stairs and use them. Fix handrails that are  broken or loose. Make sure that handrails are as long as the stairways. Check any carpeting to make sure that it is firmly attached to the stairs. Fix any carpet that is loose or worn. Avoid having throw rugs at the top or bottom of the stairs. If you do have throw rugs, attach them to the floor with carpet tape. Make sure that you have a light switch at the top of the stairs and the bottom of the stairs. If you do not have them, ask someone to add them for you. What else can I do to help prevent falls? Wear  shoes that: Do not have high heels. Have rubber bottoms. Are comfortable and fit you well. Are closed at the toe. Do not wear sandals. If you use a stepladder: Make sure that it is fully opened. Do not climb a closed stepladder. Make sure that both sides of the stepladder are locked into place. Ask someone to hold it for you, if possible. Clearly mark and make sure that you can see: Any grab bars or handrails. First and last steps. Where the edge of each step is. Use tools that help you move around (mobility aids) if they are needed. These include: Canes. Walkers. Scooters. Crutches. Turn on the lights when you go into a dark area. Replace any light bulbs as soon as they burn out. Set up your furniture so you have a clear path. Avoid moving your furniture around. If any of your floors are uneven, fix them. If there are any pets around you, be aware of where they are. Review your medicines with your doctor. Some medicines can make you feel dizzy. This can increase your chance of falling. Ask your doctor what other things that you can do to help prevent falls. This information is not intended to replace advice given to you by your health care provider. Make sure you discuss any questions you have with your health care provider. Document Released: 06/26/2009 Document Revised: 02/05/2016 Document Reviewed: 10/04/2014 Elsevier Interactive Patient Education  2017 [employer].

## 2022-05-14 ENCOUNTER — Inpatient Hospital Stay (HOSPITAL_COMMUNITY): Payer: Medicare Other | Attending: Hematology

## 2022-05-21 ENCOUNTER — Inpatient Hospital Stay: Payer: Medicare Other | Admitting: Physician Assistant

## 2022-05-25 ENCOUNTER — Ambulatory Visit: Payer: Medicare Other | Admitting: Pharmacist

## 2022-06-01 ENCOUNTER — Ambulatory Visit: Payer: Medicare Other | Admitting: Pharmacist

## 2022-06-05 ENCOUNTER — Other Ambulatory Visit: Payer: Self-pay | Admitting: Family Medicine

## 2022-06-05 DIAGNOSIS — F4321 Adjustment disorder with depressed mood: Secondary | ICD-10-CM

## 2022-06-05 DIAGNOSIS — R630 Anorexia: Secondary | ICD-10-CM

## 2022-06-14 ENCOUNTER — Encounter: Payer: Self-pay | Admitting: Family Medicine

## 2022-06-14 ENCOUNTER — Encounter: Payer: Medicare Other | Admitting: Family Medicine

## 2022-06-14 DIAGNOSIS — E785 Hyperlipidemia, unspecified: Secondary | ICD-10-CM

## 2022-06-14 DIAGNOSIS — N1831 Chronic kidney disease, stage 3a: Secondary | ICD-10-CM

## 2022-06-14 DIAGNOSIS — Z0000 Encounter for general adult medical examination without abnormal findings: Secondary | ICD-10-CM

## 2022-06-14 DIAGNOSIS — Z955 Presence of coronary angioplasty implant and graft: Secondary | ICD-10-CM

## 2022-06-14 DIAGNOSIS — I252 Old myocardial infarction: Secondary | ICD-10-CM

## 2022-06-14 DIAGNOSIS — R630 Anorexia: Secondary | ICD-10-CM

## 2022-06-14 DIAGNOSIS — I10 Essential (primary) hypertension: Secondary | ICD-10-CM

## 2022-06-14 DIAGNOSIS — R600 Localized edema: Secondary | ICD-10-CM

## 2022-06-14 DIAGNOSIS — I2510 Atherosclerotic heart disease of native coronary artery without angina pectoris: Secondary | ICD-10-CM

## 2022-06-14 DIAGNOSIS — F4321 Adjustment disorder with depressed mood: Secondary | ICD-10-CM

## 2022-06-14 DIAGNOSIS — R809 Proteinuria, unspecified: Secondary | ICD-10-CM

## 2022-06-15 ENCOUNTER — Ambulatory Visit: Payer: Medicare Other | Admitting: Family Medicine

## 2022-06-18 ENCOUNTER — Telehealth: Payer: Self-pay | Admitting: Family Medicine

## 2022-06-21 ENCOUNTER — Other Ambulatory Visit: Payer: Self-pay | Admitting: Family Medicine

## 2022-06-21 DIAGNOSIS — R630 Anorexia: Secondary | ICD-10-CM

## 2022-06-21 DIAGNOSIS — F4321 Adjustment disorder with depressed mood: Secondary | ICD-10-CM

## 2022-06-21 NOTE — Telephone Encounter (Signed)
#  4 packs up front for pt to pick up - aware

## 2022-07-02 ENCOUNTER — Telehealth: Payer: Self-pay

## 2022-07-02 NOTE — Telephone Encounter (Signed)
Received notification from [employer] regarding RE-ENROLLMENT approval for FARXIGA. Patient assistance approved from 09/13/22 to 09/13/23.  Phone: [phone]  

## 2022-07-03 ENCOUNTER — Other Ambulatory Visit: Payer: Self-pay | Admitting: Family Medicine

## 2022-07-03 DIAGNOSIS — Z955 Presence of coronary angioplasty implant and graft: Secondary | ICD-10-CM

## 2022-07-03 DIAGNOSIS — I252 Old myocardial infarction: Secondary | ICD-10-CM

## 2022-07-03 DIAGNOSIS — I2510 Atherosclerotic heart disease of native coronary artery without angina pectoris: Secondary | ICD-10-CM

## 2022-07-07 ENCOUNTER — Other Ambulatory Visit: Payer: Self-pay | Admitting: Family Medicine

## 2022-07-07 DIAGNOSIS — E785 Hyperlipidemia, unspecified: Secondary | ICD-10-CM

## 2022-07-07 DIAGNOSIS — I10 Essential (primary) hypertension: Secondary | ICD-10-CM

## 2022-07-07 DIAGNOSIS — I2510 Atherosclerotic heart disease of native coronary artery without angina pectoris: Secondary | ICD-10-CM

## 2022-07-22 NOTE — Telephone Encounter (Signed)
Disregard previous note - patient eligible for 2024 re-enrollment.

## 2022-07-29 ENCOUNTER — Ambulatory Visit (INDEPENDENT_AMBULATORY_CARE_PROVIDER_SITE_OTHER): Payer: Medicare Other | Admitting: Family Medicine

## 2022-07-29 ENCOUNTER — Encounter: Payer: Self-pay | Admitting: Family Medicine

## 2022-07-29 VITALS — BP 136/72 | HR 70 | Temp 98.8°F | Ht 68.0 in | Wt 205.4 lb

## 2022-07-29 DIAGNOSIS — E785 Hyperlipidemia, unspecified: Secondary | ICD-10-CM

## 2022-07-29 DIAGNOSIS — Z6831 Body mass index (BMI) 31.0-31.9, adult: Secondary | ICD-10-CM

## 2022-07-29 DIAGNOSIS — E1169 Type 2 diabetes mellitus with other specified complication: Secondary | ICD-10-CM | POA: Diagnosis not present

## 2022-07-29 DIAGNOSIS — R809 Proteinuria, unspecified: Secondary | ICD-10-CM

## 2022-07-29 DIAGNOSIS — I2510 Atherosclerotic heart disease of native coronary artery without angina pectoris: Secondary | ICD-10-CM

## 2022-07-29 DIAGNOSIS — D696 Thrombocytopenia, unspecified: Secondary | ICD-10-CM

## 2022-07-29 DIAGNOSIS — K760 Fatty (change of) liver, not elsewhere classified: Secondary | ICD-10-CM

## 2022-07-29 DIAGNOSIS — E1129 Type 2 diabetes mellitus with other diabetic kidney complication: Secondary | ICD-10-CM

## 2022-07-29 DIAGNOSIS — N1831 Chronic kidney disease, stage 3a: Secondary | ICD-10-CM | POA: Diagnosis not present

## 2022-07-29 DIAGNOSIS — Z23 Encounter for immunization: Secondary | ICD-10-CM

## 2022-07-29 DIAGNOSIS — E1159 Type 2 diabetes mellitus with other circulatory complications: Secondary | ICD-10-CM | POA: Diagnosis not present

## 2022-07-29 DIAGNOSIS — I252 Old myocardial infarction: Secondary | ICD-10-CM

## 2022-07-29 DIAGNOSIS — I700 Atherosclerosis of aorta: Secondary | ICD-10-CM

## 2022-07-29 DIAGNOSIS — E6609 Other obesity due to excess calories: Secondary | ICD-10-CM

## 2022-07-29 DIAGNOSIS — F1011 Alcohol abuse, in remission: Secondary | ICD-10-CM

## 2022-07-29 DIAGNOSIS — F5101 Primary insomnia: Secondary | ICD-10-CM

## 2022-07-29 DIAGNOSIS — E1121 Type 2 diabetes mellitus with diabetic nephropathy: Secondary | ICD-10-CM

## 2022-07-29 DIAGNOSIS — I152 Hypertension secondary to endocrine disorders: Secondary | ICD-10-CM

## 2022-07-29 DIAGNOSIS — G4733 Obstructive sleep apnea (adult) (pediatric): Secondary | ICD-10-CM

## 2022-07-29 LAB — CMP14+EGFR
ALT: 11 IU/L (ref 0–44)
AST: 17 IU/L (ref 0–40)
Albumin/Globulin Ratio: 1.5 (ref 1.2–2.2)
Albumin: 3.8 g/dL — ABNORMAL LOW (ref 3.9–4.9)
Alkaline Phosphatase: 60 IU/L (ref 44–121)
BUN/Creatinine Ratio: 13 (ref 10–24)
BUN: 15 mg/dL (ref 8–27)
Bilirubin Total: 0.6 mg/dL (ref 0.0–1.2)
CO2: 24 mmol/L (ref 20–29)
Calcium: 9.4 mg/dL (ref 8.6–10.2)
Chloride: 100 mmol/L (ref 96–106)
Creatinine, Ser: 1.17 mg/dL (ref 0.76–1.27)
Globulin, Total: 2.6 g/dL (ref 1.5–4.5)
Glucose: 198 mg/dL — ABNORMAL HIGH (ref 70–99)
Potassium: 5.6 mmol/L — ABNORMAL HIGH (ref 3.5–5.2)
Sodium: 136 mmol/L (ref 134–144)
Total Protein: 6.4 g/dL (ref 6.0–8.5)
eGFR: 68 mL/min/{1.73_m2} (ref >59)

## 2022-07-29 LAB — CBC WITH DIFFERENTIAL/PLATELET
Basophils Absolute: 0 10*3/uL (ref 0.0–0.2)
Basos: 1 %
EOS (ABSOLUTE): 0.2 10*3/uL (ref 0.0–0.4)
Eos: 3 %
Hematocrit: 42.6 % (ref 37.5–51.0)
Hemoglobin: 14 g/dL (ref 13.0–17.7)
Immature Grans (Abs): 0 10*3/uL (ref 0.0–0.1)
Immature Granulocytes: 1 %
Lymphocytes Absolute: 1.1 10*3/uL (ref 0.7–3.1)
Lymphs: 16 %
MCH: 28.8 pg (ref 26.6–33.0)
MCHC: 32.9 g/dL (ref 31.5–35.7)
MCV: 88 fL (ref 79–97)
Monocytes Absolute: 0.5 10*3/uL (ref 0.1–0.9)
Monocytes: 8 %
Neutrophils Absolute: 5 10*3/uL (ref 1.4–7.0)
Neutrophils: 71 %
Platelets: 126 10*3/uL — ABNORMAL LOW (ref 150–450)
RBC: 4.86 x10E6/uL (ref 4.14–5.80)
RDW: 14.1 % (ref 11.6–15.4)
WBC: 6.8 10*3/uL (ref 3.4–10.8)

## 2022-07-29 LAB — LIPID PANEL
Chol/HDL Ratio: 3.5 ratio (ref 0.0–5.0)
Cholesterol, Total: 151 mg/dL (ref 100–199)
HDL: 43 mg/dL (ref >39)
LDL Chol Calc (NIH): 84 mg/dL (ref 0–99)
Triglycerides: 136 mg/dL (ref 0–149)
VLDL Cholesterol Cal: 24 mg/dL (ref 5–40)

## 2022-07-29 LAB — BAYER DCA HB A1C WAIVED: HB A1C (BAYER DCA - WAIVED): 8.4 % — ABNORMAL HIGH (ref 4.8–5.6)

## 2022-07-29 MED ORDER — METOPROLOL TARTRATE 50 MG PO TABS: 50.0000 mg | ORAL_TABLET | Freq: Two times a day (BID) | ORAL | 1 refills | Status: DC

## 2022-07-29 MED ORDER — ISOSORBIDE MONONITRATE ER 30 MG PO TB24: ORAL_TABLET | ORAL | 1 refills | Status: DC

## 2022-07-29 MED ORDER — DULOXETINE HCL 30 MG PO CPEP: 30.0000 mg | ORAL_CAPSULE | Freq: Every day | ORAL | 1 refills | Status: DC

## 2022-07-29 MED ORDER — HYDRALAZINE HCL 50 MG PO TABS: 50.0000 mg | ORAL_TABLET | Freq: Three times a day (TID) | ORAL | 1 refills | Status: DC

## 2022-07-29 MED ORDER — CLOPIDOGREL BISULFATE 75 MG PO TABS: 75.0000 mg | ORAL_TABLET | Freq: Every day | ORAL | 1 refills | Status: DC

## 2022-07-29 MED ORDER — ATORVASTATIN CALCIUM 40 MG PO TABS: 40.0000 mg | ORAL_TABLET | Freq: Every day | ORAL | 1 refills | Status: DC

## 2022-07-29 MED ORDER — MIRTAZAPINE 7.5 MG PO TABS: 7.5000 mg | ORAL_TABLET | Freq: Every day | ORAL | 1 refills | Status: DC

## 2022-07-29 MED ORDER — AMLODIPINE BESYLATE 5 MG PO TABS: 5.0000 mg | ORAL_TABLET | Freq: Every day | ORAL | 1 refills | Status: DC

## 2022-07-29 NOTE — Progress Notes (Signed)
Established Patient Office Visit  Subjective   Patient ID: [name] Foster, male    DOB: 1955/11/11  Age: 66 y.o. MRN: [id number]  Chief Complaint  Patient presents with   Medical Management of Chronic Issues   Diabetes   Hyperlipidemia    HPI DM Pt presents for follow up evaluation of Type 2 diabetes mellitus.  Patient denies hypoglycemia , increased appetite, nausea, polydipsia, polyuria, visual disturbances, vomiting, and weight loss.  Current diabetic medications include farixga (has been out for a few weeks)  Current monitoring regimen: none Any episodes of hypoglycemia? no  Known diabetic complications: nephropathy and cardiovascular disease Cardiovascular risk factors: advanced age (older than 26 for men, 61 for women), diabetes mellitus, dyslipidemia, hypertension, male gender, and obesity (BMI >= 30 kg/m2) Eye exam current (within one year):  has scheduled next month Current diet: diabetic. Reports he had to eat a regular diet while in rehab for 30 days Current exercise: walking  Urine microalbumin UTD? Yes  Is He on ACE inhibitor or angiotensin II receptor blocker?  No Is He on statin? Yes    2. HTN Complaint with meds - Yes Current Medications - amlodipine, hydralazine, HCTZ, metoprolol Pertinent ROS:  Headache - No Fatigue - No Visual Disturbances - No Chest pain - No Dyspnea - No Palpitations - No LE edema - sometimes dependent edema  He is due for a visit with cardiology. Sees them yearly. On imdur, plavix, and atorvastatin.   3. Alcohol use Completed 30 day program on 06/21/22. He has a few drinks but has been doing well with this overall.   4. CKD He has been seeing Dr. Marylene Buerger. Last visit was 6 months ago.   5. 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 He was on duloxetine for about 2 weekw while in rehab. He felt like this was helpful and would like to restart this.   6. Insomnia He has been taking  mirtazapine with good relief.   7. Sleep apnea Reports dx of sleep apnea. It tried CPAP years ago but did not tolerate this.      05/10/2022    3:06 PM 12/10/2021    1:36 PM 05/07/2021    2:25 PM  Depression screen PHQ 2/9  Decreased Interest 1 0 3  Down, Depressed, Hopeless 1 0   PHQ - 2 Score 2 0 3  Altered sleeping 0 2 2  Tired, decreased energy _0 Change in appetite _1 Feeling bad or failure about yourself  0 0 1  Trouble concentrating 0 0 1  Moving slowly or fidgety/restless 0 0   Suicidal thoughts 0 0 0  PHQ-9 Score _2 Difficult doing work/chores  Not difficult at all Somewhat difficult      12/10/2021    1:37 PM 05/06/2021   11:55 AM 04/21/2021    4:28 PM 02/03/2021   10:34 AM  GAD 7 : Generalized Anxiety Score  Nervous, Anxious, on Edge 0 2 0 0  Control/stop worrying 0 1 0 0  Worry too much - different things 0 1 0 0  Trouble relaxing 0 1 0 0  Restless 0 1 0 0  Easily annoyed or irritable 0 1 0 0  Afraid - awful might happen 0 1 0 0  Total GAD 7 Score 0 8 0 0  Anxiety Difficulty Not difficult at all Somewhat difficult Somewhat difficult Not difficult at all  Past Medical History:  Diagnosis Date   Arthritis    CAD (coronary artery disease)    a. 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 DES to left PDA 09/20/17 // c. 10/19: LCx and LPDA stents ok; s/p DES to pLAD and POBA to D1   Diabetes mellitus without complication ([place])    type 2    Diabetic nephropathy ([place])    Echocardiogram 08/2021    Echocardiogram 12/22: EF 60-65, no RWMA, GLS -19.4% (normal), normal RVSF, trivial MR, mild dilation of ascending aorta (37 mm)   Heart attack ([place])    " a blood clot is actually what caused my heart attack in 2018 " , he states he is unsure where the blood clot originated from    Hyperlipidemia    Hypertension    Insomnia    Pulmonary  hypertension ([place])    a. TTE 03/25/17; EF 60-65%, nl WM, LV diastolic fxn nl, trivial AI, RV cavity size, wall thickness, and sys fxn nl, trivial TR, PASP 39 mmHg, trivial pericardial effusion // Echo 12/22: normal RVSP, PASP   SCCA (squamous cell carcinoma) of skin 03/26/2020   Mid Parietal Scalp (well diff)   Sleep apnea    could not use a cpap   Vitamin B12 deficiency 05/03/2020   Wears dentures    full top-partial bottom   Wears glasses       ROS As per HPI.    Objective:     BP 136/72   Pulse 70   Temp 98.8 F (37.1 C) (Temporal)   Ht _0  (1.727 m)   Wt 205 lb 6 oz (93.2 kg)   SpO2 95%   BMI 31.23 kg/m  BP Readings from Last 3 Encounters:  07/29/22 136/72  02/11/22 (!) 159/91  01/29/22 (!) 146/77   Wt Readings from Last 3 Encounters:  07/29/22 205 lb 6 oz (93.2 kg)  05/10/22 196 lb (88.9 kg)  02/09/22 196 lb 3.4 oz (89 kg)      Physical Exam Vitals and nursing note reviewed.  Constitutional:      General: He is not in acute distress.    Appearance: He is not ill-appearing, toxic-appearing or diaphoretic.  HENT:     Nose: Nose normal.     Mouth/Throat:     Mouth: Mucous membranes are moist.     Pharynx: Oropharynx is clear.  Eyes:     General:        Right eye: No discharge.        Left eye: No discharge.     Extraocular Movements: Extraocular movements intact.     Conjunctiva/sclera: Conjunctivae normal.     Pupils: Pupils are equal, round, and reactive to light.  Neck:     Thyroid: No thyroid mass, thyromegaly or thyroid tenderness.     Vascular: No carotid bruit.  Cardiovascular:     Rate and Rhythm: Normal rate and regular rhythm.     Heart sounds: Normal heart sounds. No murmur heard. Pulmonary:     Effort: Pulmonary effort is normal.     Breath sounds: Normal breath sounds.  Abdominal:     General: Bowel sounds are normal. There is no distension.     Palpations: Abdomen is soft.     Tenderness: There is no abdominal tenderness. There is  no guarding or rebound.  Musculoskeletal:  Right lower leg: 1+ Pitting Edema present.     Left lower leg: 1+ Pitting Edema present.  Skin:    General: Skin is warm and dry.  Neurological:     General: No focal deficit present.     Mental Status: He is alert and oriented to person, place, and time.  Psychiatric:        Mood and Affect: Mood normal.        Behavior: Behavior normal.      No results found for any visits on 07/29/22.  Last CBC Lab Results  Component Value Date   WBC 4.4 03/12/2022   HGB 12.0 (L) 03/12/2022   HCT 37.5 (L) 03/12/2022   MCV 97.2 03/12/2022   MCH 31.1 03/12/2022   RDW 15.1 03/12/2022   PLT 116 (L) 75/64/3329   Last metabolic panel Lab Results  Component Value Date   GLUCOSE 75 02/11/2022   NA 136 02/11/2022   K 3.7 02/11/2022   CL 107 02/11/2022   CO2 19 (L) 02/11/2022   BUN 32 (H) 02/11/2022   CREATININE 1.62 (H) 02/11/2022   GFRNONAA 47 (L) 02/11/2022   CALCIUM 8.4 (L) 02/11/2022   PHOS 2.5 02/10/2022   PROT 6.0 (L) 02/11/2022   ALBUMIN 3.3 (L) 02/11/2022   LABGLOB 2.8 01/29/2022   AGRATIO 1.4 01/29/2022   BILITOT 0.9 02/11/2022   ALKPHOS 44 02/11/2022   AST 73 (H) 02/11/2022   ALT 37 02/11/2022   ANIONGAP 10 02/11/2022   Last lipids Lab Results  Component Value Date   CHOL 135 12/10/2021   HDL 77 12/10/2021   LDLCALC 43 12/10/2021   TRIG 79 12/10/2021   CHOLHDL 1.8 12/10/2021   Last vitamin B12 and Folate Lab Results  Component Value Date   VITAMINB12 252 02/11/2022   FOLATE 5.5 (L) 02/11/2022      The ASCVD Risk score (Arnett DK, et al., 2019) failed to calculate for the following reasons:   The patient has a prior MI or stroke diagnosis    Assessment & Plan:   [name] Foster was seen today for medical management of chronic issues, diabetes and hyperlipidemia.  Diagnoses and all orders for this visit:  Type 2 diabetes mellitus with microalbuminuria, without long-term current use of insulin (HCC) A1c is 8.5 today,  not at goal of <7. He has been out of farxiga and recently restarted a diabetic diet. He has been approved for patient assistance for farxiga and this has been ordered. Contact information given to patient for him to call and check on this so he can restart on this. He had previously been well controlled with farxiga and diet. On statin. Not on ACE/ARB. Foot exam today. Eye exam is schedule for next month. Microalbumin is UTD.  -     CBC with Differential/Platelet -     CMP14+EGFR -     Lipid panel -     Bayer DCA Hb A1c Waived  Hypertension associated with diabetes ([place]) Well controlled on current regimen. Continue current regimen.  -     CBC with Differential/Platelet -     CMP14+EGFR -     Lipid panel -     amLODipine (NORVASC) 5 MG tablet; Take 1 tablet (5 mg total) by mouth daily. (NEEDS TO BE SEEN BEFORE NEXT REFILL) -     hydrALAZINE (APRESOLINE) 50 MG tablet; Take 1 tablet (50 mg total) by mouth every 8 (eight) hours. (NEEDS TO BE SEEN BEFORE NEXT REFILL) -     isosorbide  mononitrate (IMDUR) 30 MG 24 hr tablet; TAKE 1/2 OF A TABLET (15 MG TOTAL) BY MOUTH DAILY -     metoprolol tartrate (LOPRESSOR) 50 MG tablet; Take 1 tablet (50 mg total) by mouth 2 (two) times daily. (NEEDS TO BE SEEN BEFORE NEXT REFILL)  Stage 3a chronic kidney disease ([place]) Managed by nephrology. Reminded to schedule follow up.  -     CBC with Differential/Platelet -     CMP14+EGFR  Hyperlipidemia associated with type 2 diabetes mellitus ([place]) Labs pending. On statin.  -     Lipid panel  Coronary artery disease involving native coronary artery of native heart without angina pectoris History of ST elevation myocardial infarction (STEMI) Atherosclerosis of aorta ([place]) Managed by cardiology. Reminded to schedule follow up. Continue plavix, statin, imdur, and lopressor.  -     CBC with Differential/Platelet -     CMP14+EGFR -     Lipid panel -     atorvastatin (LIPITOR) 40 MG tablet; Take 1 tablet (40 mg total)  by mouth daily at 6 PM. (NEEDS TO BE SEEN BEFORE NEXT REFILL) -     clopidogrel (PLAVIX) 75 MG tablet; Take 1 tablet (75 mg total) by mouth daily. (NEEDS TO BE SEEN BEFORE NEXT REFILL) -     isosorbide mononitrate (IMDUR) 30 MG 24 hr tablet; TAKE 1/2 OF A TABLET (15 MG TOTAL) BY MOUTH DAILY -     metoprolol tartrate (LOPRESSOR) 50 MG tablet; Take 1 tablet (50 mg total) by mouth 2 (two) times daily. (NEEDS TO BE SEEN BEFORE NEXT REFILL)  Hepatic steatosis Labs pending. Noted on CT scan.  -     CMP14+EGFR  Thrombocytopenia ([place]) Managed by hematology. Needs follow up appointment.  -     CBC with Differential/Platelet  Diabetic nephropathy associated with type 2 diabetes mellitus ([place]) Will restart lower dosage of cymbalta.  -     DULoxetine (CYMBALTA) 30 MG capsule; Take 1 capsule (30 mg total) by mouth daily.  Obstructive sleep apnea syndrome Referral placed.  -     Ambulatory referral to Sleep Studies  Class 1 obesity due to excess calories with serious comorbidity and body mass index (BMI) of 31.0 to 31.9 in adult Diet and exercise.  -     CBC with Differential/Platelet -     CMP14+EGFR -     Lipid panel  Primary insomnia Well controlled on current regimen.  -     mirtazapine (REMERON) 7.5 MG tablet; Take 1 tablet (7.5 mg total) by mouth at bedtime. (NEEDS TO BE SEEN BEFORE NEXT REFILL)  Alcohol abuse, in remission Recently completed 30 day program.    Return in about 3 months (around 10/29/2022) for chronic follow up.   The patient indicates understanding of these issues and agrees with the plan.  Gwenlyn Perking, FNP

## 2022-07-29 NOTE — Patient Instructions (Addendum)
Call [phone] to check on Farxiga.  Call Dr. Marylene Buerger at  [phone] to schedule appointment.   Call cardiology to schedule yearly appointment.

## 2022-08-02 ENCOUNTER — Other Ambulatory Visit: Payer: Self-pay | Admitting: *Deleted

## 2022-08-02 DIAGNOSIS — E875 Hyperkalemia: Secondary | ICD-10-CM

## 2022-08-09 ENCOUNTER — Other Ambulatory Visit: Payer: Medicare Other

## 2022-08-09 DIAGNOSIS — E875 Hyperkalemia: Secondary | ICD-10-CM

## 2022-08-10 LAB — BASIC METABOLIC PANEL
BUN/Creatinine Ratio: 16 (ref 10–24)
BUN: 19 mg/dL (ref 8–27)
CO2: 23 mmol/L (ref 20–29)
Calcium: 9.7 mg/dL (ref 8.6–10.2)
Chloride: 98 mmol/L (ref 96–106)
Creatinine, Ser: 1.22 mg/dL (ref 0.76–1.27)
Glucose: 155 mg/dL — ABNORMAL HIGH (ref 70–99)
Potassium: 4.8 mmol/L (ref 3.5–5.2)
Sodium: 137 mmol/L (ref 134–144)
eGFR: 65 mL/min/{1.73_m2} (ref >59)

## 2022-10-29 ENCOUNTER — Encounter: Payer: Self-pay | Admitting: Family Medicine

## 2022-10-29 ENCOUNTER — Ambulatory Visit (INDEPENDENT_AMBULATORY_CARE_PROVIDER_SITE_OTHER): Payer: Medicare Other | Admitting: Family Medicine

## 2022-10-29 VITALS — BP 130/70 | HR 72 | Temp 98.3°F | Ht 68.0 in | Wt 195.2 lb

## 2022-10-29 DIAGNOSIS — N1831 Chronic kidney disease, stage 3a: Secondary | ICD-10-CM | POA: Diagnosis not present

## 2022-10-29 DIAGNOSIS — E1169 Type 2 diabetes mellitus with other specified complication: Secondary | ICD-10-CM | POA: Diagnosis not present

## 2022-10-29 DIAGNOSIS — E785 Hyperlipidemia, unspecified: Secondary | ICD-10-CM

## 2022-10-29 DIAGNOSIS — E1159 Type 2 diabetes mellitus with other circulatory complications: Secondary | ICD-10-CM | POA: Diagnosis not present

## 2022-10-29 DIAGNOSIS — I152 Hypertension secondary to endocrine disorders: Secondary | ICD-10-CM

## 2022-10-29 DIAGNOSIS — R809 Proteinuria, unspecified: Secondary | ICD-10-CM

## 2022-10-29 DIAGNOSIS — F1011 Alcohol abuse, in remission: Secondary | ICD-10-CM

## 2022-10-29 DIAGNOSIS — I2510 Atherosclerotic heart disease of native coronary artery without angina pectoris: Secondary | ICD-10-CM

## 2022-10-29 DIAGNOSIS — E1129 Type 2 diabetes mellitus with other diabetic kidney complication: Secondary | ICD-10-CM | POA: Diagnosis not present

## 2022-10-29 DIAGNOSIS — E1121 Type 2 diabetes mellitus with diabetic nephropathy: Secondary | ICD-10-CM

## 2022-10-29 DIAGNOSIS — I252 Old myocardial infarction: Secondary | ICD-10-CM

## 2022-10-29 DIAGNOSIS — G4733 Obstructive sleep apnea (adult) (pediatric): Secondary | ICD-10-CM

## 2022-10-29 DIAGNOSIS — F5101 Primary insomnia: Secondary | ICD-10-CM

## 2022-10-29 DIAGNOSIS — I700 Atherosclerosis of aorta: Secondary | ICD-10-CM

## 2022-10-29 LAB — BAYER DCA HB A1C WAIVED: HB A1C (BAYER DCA - WAIVED): 7.1 % — ABNORMAL HIGH (ref 4.8–5.6)

## 2022-10-29 MED ORDER — AMLODIPINE BESYLATE 5 MG PO TABS: 5.0000 mg | ORAL_TABLET | Freq: Every day | ORAL | 3 refills | Status: DC

## 2022-10-29 NOTE — Progress Notes (Signed)
Established Patient Office Visit  Subjective   Patient ID: [name] Foster, male    DOB: March 19, 1956  Age: 67 y.o. MRN: [id number]  Chief Complaint  Patient presents with   Medical Management of Chronic Issues   Diabetes   Chronic Kidney Disease    HPI DM Pt presents for follow up evaluation of Type 2 diabetes mellitus.  Patient denies hypoglycemia , increased appetite, nausea, polydipsia, polyuria, visual disturbances, vomiting, and weight loss.  Current diabetic medications include farixga   Current monitoring regimen: fasting 110-120 Any episodes of hypoglycemia? no  Known diabetic complications: nephropathy and cardiovascular disease Cardiovascular risk factors: advanced age (older than 64 for men, 69 for women), diabetes mellitus, dyslipidemia, hypertension, male gender, and obesity (BMI >= 30 kg/m2) Eye exam current (within one year):  has scheduled today Current diet: avoiding sweets Current exercise: walking  Urine microalbumin UTD? Yes  Is He on ACE inhibitor or angiotensin II receptor blocker?  No Is He on statin? Yes    2. HTN Complaint with meds - Yes Current Medications - amlodipine, hydralazine, HCTZ, metoprolol BP at home: 130s//70s Pertinent ROS:  Headache - No Fatigue - No Visual Disturbances - No Chest pain - No Dyspnea - No Palpitations - No LE edema - sometimes dependent edema  He is overdue for a visit with cardiology. Sees them yearly. On imdur, plavix, and atorvastatin.   3. Alcohol use Completed 30 day program on 06/21/22. He reports minimal alcohol intake now.   4. CKD He has been seeing Dr. Marylene Buerger. Last visit was over 6 months ago however.   5. Chronic pain Restarted on cymbalta. Reports that this has relieved his neuropathy. Continues to have some pain in his hands from arthritis.   6. Insomnia He has been taking mirtazapine with fair relief. His dog wakes him up frequently.   7. Sleep apnea Reports dx of sleep apnea. Tried  CPAP years ago but did not tolerate this. He was referred but had to cancel appt.      10/29/2022   10:19 AM 07/29/2022   12:02 PM 05/10/2022    3:06 PM  Depression screen PHQ 2/9  Decreased Interest 0 0 1  Down, Depressed, Hopeless 0 0 1  PHQ - 2 Score 0 0 2  Altered sleeping 2 2 0  Tired, decreased energy 1 1 1  $ Change in appetite 0 0 1  Feeling bad or failure about yourself  0 0 0  Trouble concentrating 0 0 0  Moving slowly or fidgety/restless 0 0 0  Suicidal thoughts 0 0 0  PHQ-9 Score 3 3 4  $ Difficult doing work/chores Not difficult at all Somewhat difficult       10/29/2022   10:19 AM 07/29/2022   12:03 PM 12/10/2021    1:37 PM 05/06/2021   11:55 AM  GAD 7 : Generalized Anxiety Score  Nervous, Anxious, on Edge 0 0 0 2  Control/stop worrying 0 0 0 1  Worry too much - different things 0 0 0 1  Trouble relaxing 0 0 0 1  Restless 0 0 0 1  Easily annoyed or irritable 0 0 0 1  Afraid - awful might happen 0 0 0 1  Total GAD 7 Score 0 0 0 8  Anxiety Difficulty Not difficult at all Not difficult at all Not difficult at all Somewhat difficult     Past Medical History:  Diagnosis Date   Arthritis    CAD (coronary artery disease)  a. 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 DES to left PDA 09/20/17 // c. 10/19: LCx and LPDA stents ok; s/p DES to pLAD and POBA to D1   Diabetes mellitus without complication ([place])    type 2    Diabetic nephropathy ([place])    Echocardiogram 08/2021    Echocardiogram 12/22: EF 60-65, no RWMA, GLS -19.4% (normal), normal RVSF, trivial MR, mild dilation of ascending aorta (37 mm)   Heart attack ([place])    " a blood clot is actually what caused my heart attack in 2018 " , he states he is unsure where the blood clot originated from    Hyperlipidemia    Hypertension    Insomnia    Pulmonary hypertension ([place])    a. TTE 03/25/17;  EF 60-65%, nl WM, LV diastolic fxn nl, trivial AI, RV cavity size, wall thickness, and sys fxn nl, trivial TR, PASP 39 mmHg, trivial pericardial effusion // Echo 12/22: normal RVSP, PASP   SCCA (squamous cell carcinoma) of skin 03/26/2020   Mid Parietal Scalp (well diff)   Sleep apnea    could not use a cpap   Vitamin B12 deficiency 05/03/2020   Wears dentures    full top-partial bottom   Wears glasses       ROS As per HPI.    Objective:     BP 130/70 Comment: at home reading per pt  Pulse 72   Temp 98.3 F (36.8 C) (Temporal)   Ht 5' 8"$  (1.727 m)   Wt 195 lb 4 oz (88.6 kg)   SpO2 94%   BMI 29.69 kg/m  BP Readings from Last 3 Encounters:  10/29/22 130/70  07/29/22 136/72  02/11/22 (!) 159/91   Wt Readings from Last 3 Encounters:  10/29/22 195 lb 4 oz (88.6 kg)  07/29/22 205 lb 6 oz (93.2 kg)  05/10/22 196 lb (88.9 kg)      Physical Exam Vitals and nursing note reviewed.  Constitutional:      General: He is not in acute distress.    Appearance: He is not ill-appearing, toxic-appearing or diaphoretic.  HENT:     Nose: Nose normal.     Mouth/Throat:     Mouth: Mucous membranes are moist.     Pharynx: Oropharynx is clear.  Eyes:     General:        Right eye: No discharge.        Left eye: No discharge.     Extraocular Movements: Extraocular movements intact.     Conjunctiva/sclera: Conjunctivae normal.     Pupils: Pupils are equal, round, and reactive to light.  Neck:     Thyroid: No thyroid mass, thyromegaly or thyroid tenderness.     Vascular: No carotid bruit.  Cardiovascular:     Rate and Rhythm: Normal rate and regular rhythm.     Heart sounds: Normal heart sounds. No murmur heard. Pulmonary:     Effort: Pulmonary effort is normal.     Breath sounds: Normal breath sounds.  Abdominal:     General: Bowel sounds are normal. There is no distension.     Palpations: Abdomen is soft.     Tenderness: There is no abdominal tenderness. There is no guarding  or rebound.  Musculoskeletal:     Right lower leg: 1+ Pitting Edema present.     Left lower leg:  1+ Pitting Edema present.  Skin:    General: Skin is warm and dry.  Neurological:     General: No focal deficit present.     Mental Status: He is alert and oriented to person, place, and time.  Psychiatric:        Mood and Affect: Mood normal.        Behavior: Behavior normal.      No results found for any visits on 10/29/22.  Last CBC Lab Results  Component Value Date   WBC 6.8 07/29/2022   HGB 14.0 07/29/2022   HCT 42.6 07/29/2022   MCV 88 07/29/2022   MCH 28.8 07/29/2022   RDW 14.1 07/29/2022   PLT 126 (L) 123XX123   Last metabolic panel Lab Results  Component Value Date   GLUCOSE 155 (H) 08/09/2022   NA 137 08/09/2022   K 4.8 08/09/2022   CL 98 08/09/2022   CO2 23 08/09/2022   BUN 19 08/09/2022   CREATININE 1.22 08/09/2022   GFRNONAA 47 (L) 02/11/2022   CALCIUM 9.7 08/09/2022   PHOS 2.5 02/10/2022   PROT 6.4 07/29/2022   ALBUMIN 3.8 (L) 07/29/2022   LABGLOB 2.6 07/29/2022   AGRATIO 1.5 07/29/2022   BILITOT 0.6 07/29/2022   ALKPHOS 60 07/29/2022   AST 17 07/29/2022   ALT 11 07/29/2022   ANIONGAP 10 02/11/2022   Last lipids Lab Results  Component Value Date   CHOL 151 07/29/2022   HDL 43 07/29/2022   LDLCALC 84 07/29/2022   TRIG 136 07/29/2022   CHOLHDL 3.5 07/29/2022   Last vitamin B12 and Folate Lab Results  Component Value Date   VITAMINB12 252 02/11/2022   FOLATE 5.5 (L) 02/11/2022      The ASCVD Risk score (Arnett DK, et al., 2019) failed to calculate for the following reasons:   The patient has a prior MI or stroke diagnosis    Assessment & Plan:   Giovonni was seen today for medical management of chronic issues, diabetes and chronic kidney disease.  Diagnoses and all orders for this visit:  Type 2 diabetes mellitus with microalbuminuria, without long-term current use of insulin (HCC) A1c is 7.1 today. Much improved from 8.5 before.  Declined change in medication, he would like to work on his diet some more. On statin. Not on ARB. Foot exam and urine micro are UTD. Has eye exam today.  -     Bayer DCA Hb A1c Waived  Hypertension associated with diabetes ([place]) Reports well controlled at home. Continue current regimen.  -     amLODipine (NORVASC) 5 MG tablet; Take 1 tablet (5 mg total) by mouth daily.  Hyperlipidemia associated with type 2 diabetes mellitus (HCC) Last LDL was 84. Discussed diet and exercise. Continue statin.   Stage 3a chronic kidney disease ([place]) On farxiga. Labs pending. Avoid NSAIDs. Schedule follow up with nephrology.  -     CMP14+EGFR -     VITAMIN D 25 Hydroxy (Vit-D Deficiency, Fractures) -     Cancel: BMP8+EGFR  Coronary artery disease involving native coronary artery of native heart without angina pectoris History of ST elevation myocardial infarction (STEMI) Atherosclerosis of aorta (HCC) Managed by cardiology. Reminded to schedule follow up. Continue plavix, statin, imdur, and lopressor.   Diabetic nephropathy associated with type 2 diabetes mellitus ([place]) Well controlled with cymbalta.   Obstructive sleep apnea syndrome Recheduled with sleep studies.   Primary insomnia Continue mirtazapine.   Alcohol abuse, in remission Reports minimal use of alcohol currently.  Return in about 3 months (around 01/27/2023) for chronic follow up.   The patient indicates understanding of these issues and agrees with the plan.  Gwenlyn Perking, FNP

## 2022-10-30 ENCOUNTER — Other Ambulatory Visit: Payer: Self-pay | Admitting: Family Medicine

## 2022-10-30 DIAGNOSIS — E559 Vitamin D deficiency, unspecified: Secondary | ICD-10-CM

## 2022-10-30 LAB — CMP14+EGFR
ALT: 14 IU/L (ref 0–44)
AST: 22 IU/L (ref 0–40)
Albumin/Globulin Ratio: 1.8 (ref 1.2–2.2)
Albumin: 4 g/dL (ref 3.9–4.9)
Alkaline Phosphatase: 71 IU/L (ref 44–121)
BUN/Creatinine Ratio: 9 — ABNORMAL LOW (ref 10–24)
BUN: 11 mg/dL (ref 8–27)
Bilirubin Total: 0.2 mg/dL (ref 0.0–1.2)
CO2: 20 mmol/L (ref 20–29)
Calcium: 9.1 mg/dL (ref 8.6–10.2)
Chloride: 101 mmol/L (ref 96–106)
Creatinine, Ser: 1.17 mg/dL (ref 0.76–1.27)
Globulin, Total: 2.2 g/dL (ref 1.5–4.5)
Glucose: 99 mg/dL (ref 70–99)
Potassium: 4 mmol/L (ref 3.5–5.2)
Sodium: 140 mmol/L (ref 134–144)
Total Protein: 6.2 g/dL (ref 6.0–8.5)
eGFR: 68 mL/min/{1.73_m2} (ref >59)

## 2022-10-30 LAB — VITAMIN D 25 HYDROXY (VIT D DEFICIENCY, FRACTURES): Vit D, 25-Hydroxy: 14 ng/mL — ABNORMAL LOW (ref 30.0–100.0)

## 2022-10-30 MED ORDER — VITAMIN D (ERGOCALCIFEROL) 1.25 MG (50000 UNIT) PO CAPS: 50000.0000 [IU] | ORAL_CAPSULE | ORAL | 0 refills | Status: DC

## 2022-11-30 ENCOUNTER — Encounter: Payer: Self-pay | Admitting: Cardiovascular Disease

## 2022-11-30 ENCOUNTER — Ambulatory Visit: Payer: Medicare Other | Attending: Cardiovascular Disease | Admitting: Cardiovascular Disease

## 2022-11-30 VITALS — BP 134/75 | HR 62 | Ht 68.0 in | Wt 193.0 lb

## 2022-11-30 DIAGNOSIS — E785 Hyperlipidemia, unspecified: Secondary | ICD-10-CM

## 2022-11-30 DIAGNOSIS — I10 Essential (primary) hypertension: Secondary | ICD-10-CM

## 2022-11-30 DIAGNOSIS — I2510 Atherosclerotic heart disease of native coronary artery without angina pectoris: Secondary | ICD-10-CM | POA: Diagnosis not present

## 2022-11-30 NOTE — Patient Instructions (Addendum)
Medication Instructions:  Your physician recommends that you continue on your current medications as directed. Please refer to the Current Medication list given to you today.  *If you need a refill on your cardiac medications before your next appointment, please call your pharmacy*   Lab Work: Fasting lipids and LFT on 3/21 between 7:30am and 5:00pm  If you have labs (blood work) drawn today and your tests are completely normal, you will receive your results only by: [place] (if you have MyChart) OR A paper copy in the mail If you have any lab test that is abnormal or we need to change your treatment, we will call you to review the results.  Follow-Up: At [facility], you and your health needs are our priority.  As part of our continuing mission to provide you with exceptional heart care, we have created designated Provider Care Teams.  These Care Teams include your primary Cardiologist (physician) and Advanced Practice Providers (APPs -  Physician Assistants and Nurse Practitioners) who all work together to provide you with the care you need, when you need it.  Your next appointment:   1 year(s)  Provider:   Sherren Mocha, MD

## 2022-11-30 NOTE — Progress Notes (Signed)
Cardiology Office Note:    Date:  11/30/2022   ID:  [name] Foster, DOB [birth year], MRN [id number]  PCP:  Gwenlyn Perking, [place] Providers Cardiologist:  Sherren Mocha, MD Cardiology APP:  [facility] [place]     Referring MD: Gwenlyn Perking, FNP   Chief Complaint  Patient presents with   Coronary Artery Disease    History of Present Illness:    [name] Foster is a 67 y.o. male with a hx of: Coronary artery disease  S/p Inf-Post STEMI 03/2017 >> PCI:  DES to LCx  S/p DES to the L PDA in 09/2017 2/2 to [place] S/p NSTEMI 06/2018 >> PCI:  Orbital atherectomy and DES to LAD Diabetes mellitus  Hypertension  Hyperlipidemia  Chronic kidney disease OSA (intol of CPAP)  S/p R THR in 04/2019  The patient is here alone today.  He has been doing pretty well from a cardiac standpoint.  He still keeps his home in this area, but he has relocated to [place], [place].  He is physically active with no exertional symptoms.  He specifically denies chest pain, chest pressure, or heart palpitations.  He denies orthopnea, PND, or lightheadedness.  He does have some shortness of breath and extremes of heat or cold.  Otherwise he does well with normal temperatures.  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  Past Medical History:  Diagnosis Date   Arthritis    CAD (coronary artery disease)    a. 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 DES to left PDA 09/20/17 // c. 10/19: LCx and LPDA stents ok; s/p DES to pLAD and POBA to D1   Diabetes mellitus without complication ([place])    type 2    Diabetic nephropathy ([place])    Echocardiogram 08/2021    Echocardiogram 12/22: EF 60-65, no RWMA, GLS -19.4% (normal), normal RVSF, trivial MR, mild dilation of ascending aorta (37 mm)   Heart attack ([place])    " a blood  clot is actually what caused my heart attack in 2018 " , he states he is unsure where the blood clot originated from    Hyperlipidemia    Hypertension    Insomnia    Pulmonary hypertension ([place])    a. TTE 03/25/17; EF 60-65%, nl WM, LV diastolic fxn nl, trivial AI, RV cavity size, wall thickness, and sys fxn nl, trivial TR, PASP 39 mmHg, trivial pericardial effusion // Echo 12/22: normal RVSP, PASP   SCCA (squamous cell carcinoma) of skin 03/26/2020   Mid Parietal Scalp (well diff)   Sleep apnea    could not use a cpap   Vitamin B12 deficiency 05/03/2020   Wears dentures    full top-partial bottom   Wears glasses     Past Surgical History:  Procedure Laterality Date   COLONOSCOPY  05/13/2010   Single anal papilla, 2 diminutive polyps at 10 cm, left-sided diverticula, one sigmoid and one a sending polyp, otherwise normal colon and TI.  Path with 1 tubular adenoma, otherwise hyperplastic polyps.  Recommendations repeat colonoscopy in 7 years.   COLONOSCOPY WITH PROPOFOL N/A 11/22/2019   Rourk, Cristopher Estimable, MD -  ten 4-9 mm polyps in the rectum, sigmoid colon and cecum were removed, three 12-25 mm polyps at the splenic flexure and  hepatic flexure removed with clip placement to the largest polyp   COLONOSCOPY WITH PROPOFOL N/A 01/14/2022   Procedure: COLONOSCOPY WITH PROPOFOL;  Surgeon: Daneil Dolin, MD;  [facility];  Service: Endoscopy;  Laterality: N/A;  11:15am, asa 3   CORONARY ATHERECTOMY N/A 07/03/2018   Procedure: CORONARY ATHERECTOMY;  Surgeon: Burnell Blanks, MD;  Location: [facility];  Service: Cardiovascular;  Laterality: N/A;   CORONARY STENT INTERVENTION N/A 09/20/2017   Procedure: CORONARY STENT INTERVENTION;  Surgeon: Leonie Man, MD;  Location: [facility];  Service: Cardiovascular;  Laterality: N/A;   CORONARY STENT INTERVENTION N/A 07/03/2018   Procedure: CORONARY STENT INTERVENTION;  Surgeon: Burnell Blanks, MD;  Location: [facility];  Service: Cardiovascular;  Laterality: N/A;   CORONARY/GRAFT ACUTE MI REVASCULARIZATION N/A 03/25/2017   Procedure: Coronary/Graft Acute MI Revascularization;  Surgeon: Sherren Mocha, MD;  Location: [facility];  Service: Cardiovascular;  Laterality: N/A;   GANGLION CYST EXCISION Left 07/25/2014   Procedure: EXCISION VOLAR AND DORSAL GANGLION LEFT WRIST;  Surgeon: Leanora Cover, MD;  Location: [facility];  Service: Orthopedics;  Laterality: Left;   INTRAVASCULAR PRESSURE WIRE/FFR STUDY N/A 09/20/2017   Procedure: INTRAVASCULAR PRESSURE WIRE/FFR STUDY;  Surgeon: Leonie Man, MD;  Location: [facility];  Service: Cardiovascular;  Laterality: N/A;   INTRAVASCULAR PRESSURE WIRE/FFR STUDY N/A 06/30/2018   Procedure: INTRAVASCULAR PRESSURE WIRE/FFR STUDY;  Surgeon: Nelva Bush, MD;  Location: [facility];  Service: Cardiovascular;  Laterality: N/A;   KNEE ARTHROSCOPY     rifgr and left   LEFT HEART CATH AND CORONARY ANGIOGRAPHY N/A 03/25/2017   Procedure: Left Heart Cath and Coronary Angiography;  Surgeon: Sherren Mocha, MD;  Location: [facility];  Service: Cardiovascular;  Laterality: N/A;   LEFT HEART CATH AND CORONARY ANGIOGRAPHY N/A 09/20/2017   Procedure: LEFT HEART CATH AND CORONARY ANGIOGRAPHY;  Surgeon: Leonie Man, MD;  Location: [facility];  Service: Cardiovascular;  Laterality: N/A;   LEFT HEART CATH AND CORONARY ANGIOGRAPHY N/A 06/30/2018   Procedure: LEFT HEART CATH AND CORONARY ANGIOGRAPHY;  Surgeon: Nelva Bush, MD;  Location: [facility];  Service: Cardiovascular;  Laterality: N/A;   POLYPECTOMY  11/22/2019   Procedure: POLYPECTOMY;  Surgeon: Daneil Dolin, MD;  [facility];  Service: Endoscopy;;   POLYPECTOMY  01/14/2022   Procedure: POLYPECTOMY;  Surgeon: Daneil Dolin, MD;  [facility];  Service: Endoscopy;;   SHOULDER ARTHROSCOPY  2012   left   SHOULDER  ARTHROSCOPY     right   TEMPORARY PACEMAKER N/A 03/25/2017   Procedure: Temporary Pacemaker;  Surgeon: Sherren Mocha, MD;  Location: [facility];  Service: Cardiovascular;  Laterality: N/A;   TONSILLECTOMY     TOTAL HIP ARTHROPLASTY Right 04/25/2019   TOTAL HIP ARTHROPLASTY Right 04/25/2019   Procedure: TOTAL HIP ARTHROPLASTY ANTERIOR APPROACH;  Surgeon: Rod Can, MD;  Location: [facility];  Service: Orthopedics;  Laterality: Right;   TOTAL KNEE ARTHROPLASTY  2002   left    Current Medications: Current Meds  Medication Sig   albuterol (VENTOLIN HFA) 108 (90 Base) MCG/ACT inhaler TAKE 2 PUFFS BY MOUTH EVERY 6 HOURS AS NEEDED FOR WHEEZE OR SHORTNESS OF BREATH (Patient taking differently: Inhale 1-2 puffs into the lungs every 6 (six) hours as needed for wheezing or shortness of breath.)   amLODipine (NORVASC) 5 MG tablet Take 1 tablet (5  mg total) by mouth daily.   atorvastatin (LIPITOR) 40 MG tablet Take 1 tablet (40 mg total) by mouth daily at 6 PM. (NEEDS TO BE SEEN BEFORE NEXT REFILL)   clopidogrel (PLAVIX) 75 MG tablet Take 1 tablet (75 mg total) by mouth daily. (NEEDS TO BE SEEN BEFORE NEXT REFILL)   Coenzyme Q10 (COQ-10) 100 MG CAPS Take 100 mg by mouth daily.   dapagliflozin propanediol (FARXIGA) 10 MG TABS tablet Take 1 tablet (10 mg total) by mouth daily before breakfast.   DULoxetine (CYMBALTA) 30 MG capsule Take 1 capsule (30 mg total) by mouth daily.   folic acid (FOLVITE) 1 MG tablet Take 1 tablet (1 mg total) by mouth daily.   hydrALAZINE (APRESOLINE) 50 MG tablet Take 1 tablet (50 mg total) by mouth every 8 (eight) hours. (NEEDS TO BE SEEN BEFORE NEXT REFILL)   hydrochlorothiazide (HYDRODIURIL) 12.5 MG tablet Take 1 tablet (12.5 mg total) by mouth daily. (NEEDS TO BE SEEN BEFORE NEXT REFILL)   isosorbide mononitrate (IMDUR) 30 MG 24 hr tablet TAKE 1/2 OF A TABLET (15 MG TOTAL) BY MOUTH DAILY   metoprolol tartrate (LOPRESSOR) 50 MG tablet Take 1 tablet (50 mg total)  by mouth 2 (two) times daily. (NEEDS TO BE SEEN BEFORE NEXT REFILL)   mirtazapine (REMERON) 7.5 MG tablet Take 1 tablet (7.5 mg total) by mouth at bedtime. (NEEDS TO BE SEEN BEFORE NEXT REFILL)   nitroGLYCERIN (NITROSTAT) 0.4 MG SL tablet DISSOLVE 1 TAB UNDER TONGUE AT ONSET OF CHEST PAIN. REPEAT EVERY 5 MINUTES FOR 2 DOSES AS NEEDED   RESTASIS 0.05 % ophthalmic emulsion Place 1 drop into both eyes daily.   Simethicone (GAS-X PO) Take 1 tablet by mouth daily.   vitamin B-12 (CYANOCOBALAMIN) 500 MCG tablet Take 1 tablet (500 mcg total) by mouth daily.   Vitamin D, Ergocalciferol, (DRISDOL) 1.25 MG (50000 UNIT) CAPS capsule Take 1 capsule (50,000 Units total) by mouth every 7 (seven) days.     Allergies:   Patient has no known allergies.   Social History   Socioeconomic History   Marital status: Widowed    Spouse name: Not on file   Number of children: 2   Years of education: Not on file   Highest education level: Not on file  Occupational History   Occupation: Retired  Tobacco Use   Smoking status: Former    Packs/day: 2.00    Years: 30.00    Additional pack years: 0.00    Total pack years: 60.00    Types: Cigarettes    Quit date: 03/25/2017    Years since quitting: 5.6   Smokeless tobacco: Never  Vaping Use   Vaping Use: Never used  Substance and Sexual Activity   Alcohol use: Yes    Comment: 3-6 shots of liquor daily   Drug use: Not Currently    Types: Marijuana    Comment: last used about 10 years ago (08/23/19)   Sexual activity: Not Currently  Other Topics Concern   Not on file  Social History Narrative   Per wife, pt was preparing to retire soon from his job at [employer].   2021-06-05 - wife passed away a couple weeks ago - he lives alone, but has lot of family nearby   Son next door, daughter lives in [place] Strain: [place]  (05/10/2022)   Overall Financial Resource Strain (CARDIA)    Difficulty of Paying Living  Expenses: Not hard  at all  Food Insecurity: No Food Insecurity (05/10/2022)   Hunger Vital Sign    Worried About Running Out of Food in the Last Year: Never true    Ran Out of Food in the Last Year: Never true  Transportation Needs: No Transportation Needs (05/10/2022)   PRAPARE - Hydrologist (Medical): No    Lack of Transportation (Non-Medical): No  Physical Activity: Insufficiently Active (05/10/2022)   Exercise Vital Sign    Days of Exercise per Week: 7 days    Minutes of Exercise per Session: 20 min  Stress: No Stress Concern Present (05/10/2022)   [place]    Feeling of Stress : Only a little  Social Connections: Moderately Isolated (05/10/2022)   Social Connection and Isolation Panel [NHANES]    Frequency of Communication with Friends and Family: More than three times a week    Frequency of Social Gatherings with Friends and Family: More than three times a week    Attends Religious Services: Never    Marine scientist or Organizations: Yes    Attends Music therapist: More than 4 times per year    Marital Status: Widowed     Family History: The patient's family history includes Heart disease in his maternal grandfather; Hypertension in his brother, father, and mother. There is no history of Colon cancer.  ROS:   Please see the history of present illness.    All other systems reviewed and are negative.  EKGs/Labs/Other Studies Reviewed:    The following studies were reviewed today: Cardiac Studies & Procedures   CARDIAC CATHETERIZATION  CARDIAC CATHETERIZATION 07/03/2018  Narrative  Prox LAD lesion is 70% stenosed.  A drug-eluting stent was successfully placed using a STENT SIERRA 2.50 X 15 MM.  Post intervention, there is a 0% residual stenosis.  1. Severe, heavily calcified mid LAD stenosis 2. Successful PTCA/orbital atherectomy/DES x 1 mid  LAD  Recommendations:Likely discharge home tomorrow. Recommend uninterrupted dual antiplatelet therapy with Aspirin 81mg  daily and Clopidogrel 75mg  daily for a minimum of 12 months (ACS - Class I recommendation).  Findings Coronary Findings Diagnostic  Dominance: Right  Left Anterior Descending Prox LAD lesion is 70% stenosed. The lesion is calcified.  Intervention  Prox LAD lesion Atherectomy CATH VISTA GUIDE 6FR XBLAD3.5 guide catheter was inserted. WIRE VIPER ADVANCE COR .012TIP guidewire was used to cross lesion. Orbital atherectomy was performed using a CROWN DIAMONDBACK CLASSIC 1.25. 4 passes taken. 4 low speed passes at 80,000 rpm. Stent Pre-stent angioplasty was performed using a BALLOON SAPPHIRE 2.5X12. A drug-eluting stent was successfully placed using a STENT SIERRA 2.50 X 15 MM. Stent strut is well apposed. Post-stent angioplasty was performed using a BALLOON SAPPHIRE [place] [id number]. Post-Intervention Lesion Assessment The intervention was successful. Pre-interventional TIMI flow is 3. Post-intervention TIMI flow is 3. No complications occurred at this lesion. There is a 0% residual stenosis post intervention.   CARDIAC CATHETERIZATION 06/30/2018  Narrative Conclusions: 1. Significant mid LAD disease of up to 70% that appears similar to prior catheterization but is positive by hemodynamic assessment today (DFR 0.71). 2. Diffuse moderate to severe diagonal disease. 3. Patent stents in LCx/OM and lPDA. 4. Stable 50% mid RCA stenosis (non-dominant). 5. Normal left ventricular filling pressure and systolic function.  Recommendations: 1. Plan for orbital atherectomy and PCI to moderately to severely calcified mid LAD stenosis on Monday (07/03/18). 2. Restart heparin infusion 2 hours after TR band removal.  3. Continue dual antiplatelet with aspirin and clopidogrel. 4. Aggressive secondary prevention.  Recommend dual antiplatelet therapy with Aspirin 81mg  daily and  Clopidogrel 75mg  daily long-term (beyond 12 months) because of prior STEMI and multivessel PCI.  Nelva Bush, MD [facility] Pager: [phone]  Findings Coronary Findings Diagnostic  Dominance: Left  Left Anterior Descending Ost LAD to Prox LAD lesion is 25% stenosed. The lesion is eccentric. The lesion is moderately calcified. Prox LAD lesion is 70% stenosed. The lesion is located at the major branch and eccentric. The lesion is moderately calcified. Pressure wire/FFR was performed on the lesion. Resting Pd/Pa 0.83.  DFR 0.71.  Adenosine not administered given markedly positive DFR.  First Diagonal Branch There is moderate  diffuse disease in the vessel. 1st Diag lesion is 90% stenosed.  Second Diagonal Branch There is moderate disease in the vessel.  Lateral Second Diagonal Branch There is moderate disease in the vessel.  Left Circumflex Vessel is large. Non-stenotic Mid Cx lesion was previously treated. The lesion is located at the major branch. Dist Cx lesion is 30% stenosed. The lesion is focal and smooth.  First Obtuse Marginal Branch Vessel is small in size.  Second Obtuse Marginal Branch Vessel is small in size. Ost 2nd Mrg to 2nd Mrg lesion is 60% stenosed.  Left Posterior Descending Artery Vessel is moderate in size. Ost LPDA lesion is 40% stenosed. The lesion was previously treated. Previously placed LPDA drug eluting stent is widely patent. The lesion is eccentric, irregular and ulcerative.  Right Coronary Artery Vessel is small. Mid RCA lesion is 50% stenosed with 50% stenosed side branch in Acute Mrg.  Intervention  No interventions have been documented.     ECHOCARDIOGRAM  ECHOCARDIOGRAM COMPLETE 09/08/2021  Narrative ECHOCARDIOGRAM REPORT    Patient Name:   [name] Foster Date of Exam: 09/08/2021 Medical Rec #:  [id number]        Height:       68.0 in Accession #:    [id number]       Weight:       201.6 lb Date of Birth:   01-25-1956        BSA:          2.051 m Patient Age:    10 years         BP:           142/75 mmHg Patient Gender: M                HR:           77 bpm. Exam Location:  [place]  Procedure: 2D Echo, Cardiac Doppler, Color Doppler and Strain Analysis  Indications:    I25.10 CAD  History:        Patient has prior history of Echocardiogram examinations, most recent 03/25/2017. Previous Myocardial Infarction, Signs/Symptoms:Shortness of Breath; Risk Factors:Diabetes, Hypertension, Dyslipidemia, Sleep Apnea and CKD, HLD. Hx of COVID 19.  Sonographer:    Marygrace Drought RCS Referring Phys: [place]   1. Left ventricular ejection fraction, by estimation, is 60 to 65%. The left ventricle has normal function. The left ventricle has no regional wall motion abnormalities. Left ventricular diastolic parameters were normal. The average left ventricular global longitudinal strain is -19.4 %. The global longitudinal strain is normal. 2. Right ventricular systolic function is normal. The right ventricular size is normal. There is normal pulmonary artery systolic pressure. 3. The mitral valve is normal in structure. Trivial mitral valve  regurgitation. No evidence of mitral stenosis. 4. The aortic valve is tricuspid. Aortic valve regurgitation is not visualized. No aortic stenosis is present. 5. Aortic dilatation noted. There is mild dilatation of the ascending aorta, measuring 37 mm. 6. The inferior vena cava is normal in size with greater than 50% respiratory variability, suggesting right atrial pressure of 3 mmHg.  FINDINGS Left Ventricle: Left ventricular ejection fraction, by estimation, is 60 to 65%. The left ventricle has normal function. The left ventricle has no regional wall motion abnormalities. The average left ventricular global longitudinal strain is -19.4 %. The global longitudinal strain is normal. The left ventricular internal cavity size was normal in size.  There is no left ventricular hypertrophy. Left ventricular diastolic parameters were normal. Indeterminate filling pressures.  Right Ventricle: The right ventricular size is normal. No increase in right ventricular wall thickness. Right ventricular systolic function is normal. There is normal pulmonary artery systolic pressure. The tricuspid regurgitant velocity is 2.20 m/s, and with an assumed right atrial pressure of 3 mmHg, the estimated right ventricular systolic pressure is 03.5 mmHg.  Left Atrium: Left atrial size was normal in size.  Right Atrium: Right atrial size was normal in size.  Pericardium: There is no evidence of pericardial effusion.  Mitral Valve: The mitral valve is normal in structure. Trivial mitral valve regurgitation. No evidence of mitral valve stenosis.  Tricuspid Valve: The tricuspid valve is normal in structure. Tricuspid valve regurgitation is trivial. No evidence of tricuspid stenosis.  Aortic Valve: The aortic valve is tricuspid. Aortic valve regurgitation is not visualized. No aortic stenosis is present.  Pulmonic Valve: The pulmonic valve was normal in structure. Pulmonic valve regurgitation is not visualized. No evidence of pulmonic stenosis.  Aorta: Aortic dilatation noted. There is mild dilatation of the ascending aorta, measuring 37 mm.  Venous: The inferior vena cava is normal in size with greater than 50% respiratory variability, suggesting right atrial pressure of 3 mmHg.  IAS/Shunts: No atrial level shunt detected by color flow Doppler.   LEFT VENTRICLE PLAX 2D LVIDd:         4.80 cm   Diastology LVIDs:         2.90 cm   LV e' medial:    9.14 cm/s LV PW:         1.00 cm   LV E/e' medial:  11.6 LV IVS:        1.00 cm   LV e' lateral:   12.90 cm/s LVOT diam:     2.10 cm   LV E/e' lateral: 8.2 LV SV:         66 LV SV Index:   32        2D Longitudinal Strain LVOT Area:     3.46 cm  2D Strain GLS (A2C):   -17.1 % 2D Strain GLS (A3C):   -20.4  % 2D Strain GLS (A4C):   -20.8 % 2D Strain GLS Avg:     -19.4 %  RIGHT VENTRICLE RV Basal diam:  2.50 cm RV S prime:     13.30 cm/s TAPSE (M-mode): 1.9 cm RVSP:           22.4 mmHg  LEFT ATRIUM           Index        RIGHT ATRIUM           Index LA diam:      4.40 cm 2.15 cm/m   RA Pressure: 3.00 mmHg LA Vol (A4C): 46.6  ml 22.72 ml/m  RA Area:     10.80 cm RA Volume:   21.00 ml  10.24 ml/m AORTIC VALVE LVOT Vmax:   96.60 cm/s LVOT Vmean:  71.400 cm/s LVOT VTI:    0.191 m  AORTA Ao Root diam: 3.40 cm Ao Asc diam:  3.70 cm  MITRAL VALVE                TRICUSPID VALVE MV Area (PHT):              TR Peak grad:   19.4 mmHg MV Decel Time:              TR Vmax:        220.00 cm/s MV E velocity: 106.00 cm/s  Estimated RAP:  3.00 mmHg MV A velocity: 47.20 cm/s   RVSP:           22.4 mmHg MV E/A ratio:  2.25 SHUNTS Systemic VTI:  0.19 m Systemic Diam: 2.10 cm  Skeet Latch MD Electronically signed by Skeet Latch MD Signature Date/Time: 09/08/2021/6:52:10 PM    Final              EKG:  EKG is ordered today.  The ekg ordered today demonstrates normal sinus rhythm 62 bpm, within normal limits.  Recent Labs: 02/10/2022: Magnesium 1.4 02/11/2022: TSH 4.250 07/29/2022: Hemoglobin 14.0; Platelets 126 10/29/2022: ALT 14; BUN 11; Creatinine, Ser 1.17; Potassium 4.0; Sodium 140  Recent Lipid Panel    Component Value Date/Time   CHOL 151 07/29/2022 1121   TRIG 136 07/29/2022 1121   HDL 43 07/29/2022 1121   CHOLHDL 3.5 07/29/2022 1121   CHOLHDL 4.4 07/01/2018 0334   VLDL 36 07/01/2018 0334   LDLCALC 84 07/29/2022 1121     Risk Assessment/Calculations:                Physical Exam:    VS:  BP 134/75 (BP Location: Left Arm, Patient Position: Sitting, Cuff Size: Normal)   Pulse 62   Ht 5\' 8"  (1.727 m)   Wt 193 lb (87.5 kg)   SpO2 95%   BMI 29.35 kg/m     Wt Readings from Last 3 Encounters:  11/30/22 193 lb (87.5 kg)  10/29/22 195 lb 4 oz (88.6 kg)   07/29/22 205 lb 6 oz (93.2 kg)     GEN:  Well nourished, well developed in no acute distress HEENT: Normal NECK: No JVD; No carotid bruits LYMPHATICS: No lymphadenopathy CARDIAC: RRR, no murmurs, rubs, gallops RESPIRATORY:  Clear to auscultation without rales, wheezing or rhonchi  ABDOMEN: Soft, non-tender, non-distended MUSCULOSKELETAL:  No edema; No deformity  SKIN: Warm and dry NEUROLOGIC:  Alert and oriented x 3 PSYCHIATRIC:  Normal affect   ASSESSMENT:    1. Coronary artery disease involving native coronary artery of native heart without angina pectoris   2. Essential hypertension   3. Dyslipidemia, goal LDL below 70    PLAN:    In order of problems listed above:  Patient doing well without angina on antiplatelet monotherapy with clopidogrel.  He will remain on amlodipine, isosorbide, and metoprolol for antianginal therapy. Blood pressure well-controlled on amlodipine, hydralazine, hydrochlorothiazide, and metoprolol.  Continue same medications.  He has reduced his hydralazine to twice daily dosing because of low blood pressures. LDL has been at goal with exception of his last reading when it was greater than 70 mg/dL.  States that he was in rehab at the time and was eating a lot more than normal.  A lot of his labs were noted to be out of range as his hemoglobin A1c was much higher than baseline back then as well.  Will repeat lipids and LFTs and see if we need to intensify his lipid-lowering therapy.  Otherwise he will follow-up in 1 year unless problems arise.      Medication Adjustments/Labs and Tests Ordered: Current medicines are reviewed at length with the patient today.  Concerns regarding medicines are outlined above.  Orders Placed This Encounter  Procedures   Lipid panel   Hepatic function panel   EKG 12-Lead   No orders of the defined types were placed in this encounter.   Patient Instructions  Medication Instructions:  Your physician recommends that you  continue on your current medications as directed. Please refer to the Current Medication list given to you today.  *If you need a refill on your cardiac medications before your next appointment, please call your pharmacy*   Lab Work: Fasting lipids and LFT on 3/21 between 7:30am and 5:00pm  If you have labs (blood work) drawn today and your tests are completely normal, you will receive your results only by: [place] (if you have MyChart) OR A paper copy in the mail If you have any lab test that is abnormal or we need to change your treatment, we will call you to review the results.  Follow-Up: At [facility], you and your health needs are our priority.  As part of our continuing mission to provide you with exceptional heart care, we have created designated Provider Care Teams.  These Care Teams include your primary Cardiologist (physician) and Advanced Practice Providers (APPs -  Physician Assistants and Nurse Practitioners) who all work together to provide you with the care you need, when you need it.  Your next appointment:   1 year(s)  Provider:   Sherren Mocha, MD       Signed, Sherren Mocha, MD  11/30/2022 6:01 PM    [place]

## 2022-12-01 LAB — HM DIABETES EYE EXAM

## 2022-12-02 ENCOUNTER — Ambulatory Visit: Payer: Medicare Other | Attending: Cardiovascular Disease

## 2022-12-02 DIAGNOSIS — I2510 Atherosclerotic heart disease of native coronary artery without angina pectoris: Secondary | ICD-10-CM

## 2022-12-03 LAB — HEPATIC FUNCTION PANEL
ALT: 17 IU/L (ref 0–44)
AST: 27 IU/L (ref 0–40)
Albumin: 3.9 g/dL (ref 3.9–4.9)
Alkaline Phosphatase: 71 IU/L (ref 44–121)
Bilirubin Total: 0.3 mg/dL (ref 0.0–1.2)
Bilirubin, Direct: 0.13 mg/dL (ref 0.00–0.40)
Total Protein: 6.5 g/dL (ref 6.0–8.5)

## 2022-12-03 LAB — LIPID PANEL
Chol/HDL Ratio: 3 ratio (ref 0.0–5.0)
Cholesterol, Total: 182 mg/dL (ref 100–199)
HDL: 60 mg/dL (ref >39)
LDL Chol Calc (NIH): 101 mg/dL — ABNORMAL HIGH (ref 0–99)
Triglycerides: 118 mg/dL (ref 0–149)
VLDL Cholesterol Cal: 21 mg/dL (ref 5–40)

## 2022-12-21 ENCOUNTER — Other Ambulatory Visit: Payer: Self-pay

## 2022-12-21 DIAGNOSIS — E785 Hyperlipidemia, unspecified: Secondary | ICD-10-CM

## 2022-12-21 MED ORDER — EZETIMIBE 10 MG PO TABS: 10.0000 mg | ORAL_TABLET | Freq: Every day | ORAL | 2 refills | Status: DC

## 2022-12-25 ENCOUNTER — Other Ambulatory Visit: Payer: Self-pay | Admitting: Cardiovascular Disease

## 2022-12-25 ENCOUNTER — Other Ambulatory Visit: Payer: Self-pay | Admitting: Family Medicine

## 2022-12-25 DIAGNOSIS — E785 Hyperlipidemia, unspecified: Secondary | ICD-10-CM

## 2022-12-25 DIAGNOSIS — E1121 Type 2 diabetes mellitus with diabetic nephropathy: Secondary | ICD-10-CM

## 2023-01-15 ENCOUNTER — Other Ambulatory Visit: Payer: Self-pay | Admitting: Family Medicine

## 2023-01-15 DIAGNOSIS — E559 Vitamin D deficiency, unspecified: Secondary | ICD-10-CM

## 2023-01-20 ENCOUNTER — Telehealth: Payer: Self-pay | Admitting: Family Medicine

## 2023-01-20 NOTE — Telephone Encounter (Signed)
[name] Foster scheduled for their annual wellness visit. Appointment made for 05/12/2023.  Thank you,  Judeth Cornfield,  AMB Clinical Support [facility] AWV Program Direct Dial ??[phone]

## 2023-01-22 ENCOUNTER — Other Ambulatory Visit: Payer: Self-pay | Admitting: Family Medicine

## 2023-01-22 DIAGNOSIS — F5101 Primary insomnia: Secondary | ICD-10-CM

## 2023-01-28 ENCOUNTER — Encounter: Payer: Self-pay | Admitting: Family Medicine

## 2023-01-28 ENCOUNTER — Other Ambulatory Visit: Payer: Self-pay | Admitting: Family Medicine

## 2023-01-28 ENCOUNTER — Ambulatory Visit (INDEPENDENT_AMBULATORY_CARE_PROVIDER_SITE_OTHER): Payer: Medicare Other | Admitting: Family Medicine

## 2023-01-28 VITALS — BP 111/62 | HR 63 | Temp 98.2°F | Ht 68.0 in | Wt 190.2 lb

## 2023-01-28 DIAGNOSIS — R809 Proteinuria, unspecified: Secondary | ICD-10-CM

## 2023-01-28 DIAGNOSIS — E1169 Type 2 diabetes mellitus with other specified complication: Secondary | ICD-10-CM

## 2023-01-28 DIAGNOSIS — I152 Hypertension secondary to endocrine disorders: Secondary | ICD-10-CM

## 2023-01-28 DIAGNOSIS — E1129 Type 2 diabetes mellitus with other diabetic kidney complication: Secondary | ICD-10-CM

## 2023-01-28 DIAGNOSIS — R399 Unspecified symptoms and signs involving the genitourinary system: Secondary | ICD-10-CM

## 2023-01-28 DIAGNOSIS — E1159 Type 2 diabetes mellitus with other circulatory complications: Secondary | ICD-10-CM

## 2023-01-28 DIAGNOSIS — I700 Atherosclerosis of aorta: Secondary | ICD-10-CM | POA: Diagnosis not present

## 2023-01-28 DIAGNOSIS — I2510 Atherosclerotic heart disease of native coronary artery without angina pectoris: Secondary | ICD-10-CM

## 2023-01-28 DIAGNOSIS — E785 Hyperlipidemia, unspecified: Secondary | ICD-10-CM

## 2023-01-28 DIAGNOSIS — Z7984 Long term (current) use of oral hypoglycemic drugs: Secondary | ICD-10-CM

## 2023-01-28 DIAGNOSIS — E559 Vitamin D deficiency, unspecified: Secondary | ICD-10-CM

## 2023-01-28 DIAGNOSIS — I252 Old myocardial infarction: Secondary | ICD-10-CM

## 2023-01-28 LAB — BAYER DCA HB A1C WAIVED: HB A1C (BAYER DCA - WAIVED): 7 % — ABNORMAL HIGH (ref 4.8–5.6)

## 2023-01-28 MED ORDER — HYDRALAZINE HCL 50 MG PO TABS
50.0000 mg | ORAL_TABLET | Freq: Three times a day (TID) | ORAL | 1 refills | Status: DC
Start: 2023-01-28 — End: 2023-05-02

## 2023-01-28 MED ORDER — TAMSULOSIN HCL 0.4 MG PO CAPS
0.4000 mg | ORAL_CAPSULE | Freq: Every day | ORAL | 3 refills | Status: DC
Start: 2023-01-28 — End: 2023-01-31

## 2023-01-28 MED ORDER — METOPROLOL TARTRATE 50 MG PO TABS
50.0000 mg | ORAL_TABLET | Freq: Two times a day (BID) | ORAL | 1 refills | Status: DC
Start: 2023-01-28 — End: 2023-05-02

## 2023-01-28 MED ORDER — DAPAGLIFLOZIN PROPANEDIOL 10 MG PO TABS
10.0000 mg | ORAL_TABLET | Freq: Every day | ORAL | 1 refills | Status: DC
Start: 2023-01-28 — End: 2023-01-28

## 2023-01-28 MED ORDER — ISOSORBIDE MONONITRATE ER 30 MG PO TB24
ORAL_TABLET | ORAL | 1 refills | Status: DC
Start: 2023-01-28 — End: 2023-05-02

## 2023-01-28 MED ORDER — ATORVASTATIN CALCIUM 40 MG PO TABS: 40.0000 mg | ORAL_TABLET | Freq: Every day | ORAL | 1 refills | Status: DC

## 2023-01-28 NOTE — Progress Notes (Signed)
Established Patient Office Visit  Subjective   Patient ID: [name] Foster, male    DOB: 1956/01/12  Age: 67 y.o. MRN: [id number]  Chief Complaint  Patient presents with   Medical Management of Chronic Issues    3 month chronic follow up     HPI DM Pt presents for follow up evaluation of Type 2 diabetes mellitus.  Patient denies hypoglycemia , increased appetite, nausea, polydipsia, polyuria, visual disturbances, vomiting, and weight loss.  Current diabetic medications include farixga   Current monitoring regimen: fasting 110-120 Any episodes of hypoglycemia? no  Is He on ACE inhibitor or angiotensin II receptor blocker?  No Is He on statin? Yes   Reports he could improve his diet.   2. HTN Complaint with meds - Yes Current Medications - amlodipine, hydralazine, HCTZ, metoprolol BP at home: 130s//70s Pertinent ROS:  Headache - No Fatigue - No Visual Disturbances - No Chest pain - No Dyspnea - No Palpitations - No LE edema - sometimes dependent edema  He is overdue for a visit with cardiology. Sees them yearly. On imdur, plavix, and atorvastatin. Started on zetia last month.   3. HLD Saw cardiology recently. He is on atorvastatin. Cardiology added zetia about 1 month ago. He has a follow up appt for recheck scheduled with them.   4. CKD Has not followed up with nephrology.   5. Chronic pain Restarted on cymbalta. Reports that this has taken the edge off of his neuropathy. Continues to have some pain in his hands from arthritis.   6. Insomnia He has been taking mirtazapine with fair relief. Reports symptoms are stable, up and down.    7. LUTS symptoms Reports frequency, urgency, and nocturia (3-4x a night) for a while. Denies dysuria.      01/28/2023   10:37 AM 10/29/2022   10:19 AM 07/29/2022   12:02 PM  Depression screen PHQ 2/9  Decreased Interest 0 0 0  Down, Depressed, Hopeless 0 0 0  PHQ - 2 Score 0 0 0  Altered sleeping 1 2 2   Tired, decreased  energy 1 1 1   Change in appetite 0 0 0  Feeling bad or failure about yourself  0 0 0  Trouble concentrating 0 0 0  Moving slowly or fidgety/restless 0 0 0  Suicidal thoughts 0 0 0  PHQ-9 Score 2 3 3   Difficult doing work/chores Not difficult at all Not difficult at all Somewhat difficult      01/28/2023   10:37 AM 10/29/2022   10:19 AM 07/29/2022   12:03 PM 12/10/2021    1:37 PM  GAD 7 : Generalized Anxiety Score  Nervous, Anxious, on Edge 0 0 0 0  Control/stop worrying 0 0 0 0  Worry too much - different things 0 0 0 0  Trouble relaxing 0 0 0 0  Restless 0 0 0 0  Easily annoyed or irritable 0 0 0 0  Afraid - awful might happen 0 0 0 0  Total GAD 7 Score 0 0 0 0  Anxiety Difficulty Not difficult at all Not difficult at all Not difficult at all Not difficult at all     Past Medical History:  Diagnosis Date   Arthritis    CAD (coronary artery disease)    a. 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 DES to left PDA 09/20/17 //  c. 10/19: LCx and LPDA stents ok; s/p DES to pLAD and POBA to D1   Diabetes mellitus without complication (HCC)    type 2    Diabetic nephropathy (HCC)    Echocardiogram 08/2021    Echocardiogram 12/22: EF 60-65, no RWMA, GLS -19.4% (normal), normal RVSF, trivial MR, mild dilation of ascending aorta (37 mm)   Heart attack (HCC)    " a blood clot is actually what caused my heart attack in 2018 " , he states he is unsure where the blood clot originated from    Hyperlipidemia    Hypertension    Insomnia    Pulmonary hypertension (HCC)    a. TTE 03/25/17; EF 60-65%, nl WM, LV diastolic fxn nl, trivial AI, RV cavity size, wall thickness, and sys fxn nl, trivial TR, PASP 39 mmHg, trivial pericardial effusion // Echo 12/22: normal RVSP, PASP   SCCA (squamous cell carcinoma) of skin 03/26/2020   Mid Parietal Scalp (well diff)   Sleep apnea     could not use a cpap   Vitamin B12 deficiency 05/03/2020   Wears dentures    full top-partial bottom   Wears glasses       ROS As per HPI.    Objective:     BP 111/62   Pulse 63   Temp 98.2 F (36.8 C) (Temporal)   Ht 5\' 8"  (1.727 m)   Wt 190 lb 3.2 oz (86.3 kg)   SpO2 94%   BMI 28.92 kg/m  BP Readings from Last 3 Encounters:  01/28/23 111/62  11/30/22 134/75  10/29/22 130/70   Wt Readings from Last 3 Encounters:  01/28/23 190 lb 3.2 oz (86.3 kg)  11/30/22 193 lb (87.5 kg)  10/29/22 195 lb 4 oz (88.6 kg)      Physical Exam Vitals and nursing note reviewed.  Constitutional:      General: He is not in acute distress.    Appearance: He is not ill-appearing, toxic-appearing or diaphoretic.  HENT:     Nose: Nose normal.     Mouth/Throat:     Mouth: Mucous membranes are moist.     Pharynx: Oropharynx is clear.  Eyes:     General:        Right eye: No discharge.        Left eye: No discharge.     Extraocular Movements: Extraocular movements intact.     Conjunctiva/sclera: Conjunctivae normal.     Pupils: Pupils are equal, round, and reactive to light.  Neck:     Thyroid: No thyroid mass, thyromegaly or thyroid tenderness.     Vascular: No carotid bruit.  Cardiovascular:     Rate and Rhythm: Normal rate and regular rhythm.     Heart sounds: Normal heart sounds. No murmur heard. Pulmonary:     Effort: Pulmonary effort is normal.     Breath sounds: Normal breath sounds.  Abdominal:     General: Bowel sounds are normal. There is no distension.     Palpations: Abdomen is soft.     Tenderness: There is no abdominal tenderness. There is no guarding or rebound.  Musculoskeletal:     Right lower leg: 1+ Pitting Edema present.     Left lower leg: 1+ Pitting Edema present.  Skin:    General: Skin is warm and dry.  Neurological:     General: No focal deficit present.     Mental Status: He is alert and oriented to person, place, and time.  Psychiatric:  Mood and Affect: Mood normal.        Behavior: Behavior normal.      No results found for any visits on 01/28/23.  Last CBC Lab Results  Component Value Date   WBC 6.8 07/29/2022   HGB 14.0 07/29/2022   HCT 42.6 07/29/2022   MCV 88 07/29/2022   MCH 28.8 07/29/2022   RDW 14.1 07/29/2022   PLT 126 (L) 07/29/2022   Last metabolic panel Lab Results  Component Value Date   GLUCOSE 99 10/29/2022   NA 140 10/29/2022   K 4.0 10/29/2022   CL 101 10/29/2022   CO2 20 10/29/2022   BUN 11 10/29/2022   CREATININE 1.17 10/29/2022   GFRNONAA 47 (L) 02/11/2022   CALCIUM 9.1 10/29/2022   PHOS 2.5 02/10/2022   PROT 6.5 12/02/2022   ALBUMIN 3.9 12/02/2022   LABGLOB 2.2 10/29/2022   AGRATIO 1.8 10/29/2022   BILITOT 0.3 12/02/2022   ALKPHOS 71 12/02/2022   AST 27 12/02/2022   ALT 17 12/02/2022   ANIONGAP 10 02/11/2022   Last lipids Lab Results  Component Value Date   CHOL 182 12/02/2022   HDL 60 12/02/2022   LDLCALC 101 (H) 12/02/2022   TRIG 118 12/02/2022   CHOLHDL 3.0 12/02/2022   Last vitamin B12 and Folate Lab Results  Component Value Date   VITAMINB12 252 02/11/2022   FOLATE 5.5 (L) 02/11/2022      The ASCVD Risk score (Arnett DK, et al., 2019) failed to calculate for the following reasons:   The patient has a prior MI or stroke diagnosis    Assessment & Plan:   Jemarcus was seen today for medical management of chronic issues.  Diagnoses and all orders for this visit:  Type 2 diabetes mellitus with microalbuminuria, without long-term current use of insulin (HCC) A1c 7.0 today, not at goal of <7, improved again from previous. Medication changes today: declined. Reports that he will improve his diet. Declined ACE/ARB. He is on a statin. Eye exam: records requested. Foot exam: UTD. Urine micro: today. Diet and exercise.  -     Bayer DCA Hb A1c Waived -     dapagliflozin propanediol (FARXIGA) 10 MG TABS tablet; Take 1 tablet (10 mg total) by mouth daily before  breakfast. -     Microalbumin / creatinine urine ratio  Hypertension associated with diabetes (HCC) Well controlled on current regimen.  -     hydrALAZINE (APRESOLINE) 50 MG tablet; Take 1 tablet (50 mg total) by mouth every 8 (eight) hours. -     isosorbide mononitrate (IMDUR) 30 MG 24 hr tablet; TAKE 1/2 OF A TABLET (15 MG TOTAL) BY MOUTH DAILY -     metoprolol tartrate (LOPRESSOR) 50 MG tablet; Take 1 tablet (50 mg total) by mouth 2 (two) times daily.  Hyperlipidemia associated with type 2 diabetes mellitus (HCC) On statin. Recently started zetia. Has appt with cardiology for recheck.   Atherosclerosis of aorta (HCC) History of ST elevation myocardial infarction (STEMI) Coronary artery disease involving native coronary artery of native heart without angina pectoris Recently had follow up with cardiology with good report.  -     atorvastatin (LIPITOR) 40 MG tablet; Take 1 tablet (40 mg total) by mouth daily at 6 PM. -     isosorbide mononitrate (IMDUR) 30 MG 24 hr tablet; TAKE 1/2 OF A TABLET (15 MG TOTAL) BY MOUTH DAILY -     metoprolol tartrate (LOPRESSOR) 50 MG tablet; Take 1 tablet (50 mg total)  by mouth 2 (two) times daily.  Vitamin D deficiency -     VITAMIN D 25 Hydroxy (Vit-D Deficiency, Fractures)  Lower urinary tract symptoms (LUTS) PSA discussed and ordered. Try flomax as below. Discussed referral to urology if PSA is elevated.  -     tamsulosin (FLOMAX) 0.4 MG CAPS capsule; Take 1 capsule (0.4 mg total) by mouth daily. -     PSA, total and free  Return in about 3 months (around 04/30/2023) for chronic follow up.   The patient indicates understanding of these issues and agrees with the plan.  Gabriel Earing, FNP

## 2023-01-28 NOTE — Telephone Encounter (Signed)
Jardiance sent instead of farxiga due to insurance coverage.

## 2023-01-28 NOTE — Telephone Encounter (Signed)
FARXIGA 10 MG TABS tablet   Pharmacy comment: Alternative Requested:PLAN EXCLUSION. INSURANCE PREFERS JARDIANCE. PLEASE SEND PRIOR AUTH OR AN ALTERNATIVE. BRAND OR GENERIC NEED PRIOR AUTH.

## 2023-01-29 ENCOUNTER — Other Ambulatory Visit: Payer: Self-pay | Admitting: Family Medicine

## 2023-01-29 DIAGNOSIS — R399 Unspecified symptoms and signs involving the genitourinary system: Secondary | ICD-10-CM

## 2023-01-29 LAB — MICROALBUMIN / CREATININE URINE RATIO
Creatinine, Urine: 237.8 mg/dL
Microalb/Creat Ratio: 5302 mg/g creat — ABNORMAL HIGH (ref 0–29)
Microalbumin, Urine: 12609 ug/mL

## 2023-01-29 LAB — PSA, TOTAL AND FREE
PSA, Free Pct: 53.3 %
PSA, Free: 0.32 ng/mL
Prostate Specific Ag, Serum: 0.6 ng/mL (ref 0.0–4.0)

## 2023-01-29 LAB — VITAMIN D 25 HYDROXY (VIT D DEFICIENCY, FRACTURES): Vit D, 25-Hydroxy: 45.5 ng/mL (ref 30.0–100.0)

## 2023-02-14 ENCOUNTER — Other Ambulatory Visit: Payer: Medicare Other

## 2023-02-14 DIAGNOSIS — N1831 Chronic kidney disease, stage 3a: Secondary | ICD-10-CM

## 2023-02-15 LAB — BMP8+EGFR
BUN/Creatinine Ratio: 25 — ABNORMAL HIGH (ref 10–24)
BUN: 34 mg/dL — ABNORMAL HIGH (ref 8–27)
CO2: 15 mmol/L — ABNORMAL LOW (ref 20–29)
Calcium: 8.8 mg/dL (ref 8.6–10.2)
Chloride: 97 mmol/L (ref 96–106)
Creatinine, Ser: 1.38 mg/dL — ABNORMAL HIGH (ref 0.76–1.27)
Glucose: 104 mg/dL — ABNORMAL HIGH (ref 70–99)
Potassium: 4.3 mmol/L (ref 3.5–5.2)
Sodium: 137 mmol/L (ref 134–144)
eGFR: 56 mL/min/{1.73_m2} — ABNORMAL LOW (ref >59)

## 2023-02-17 ENCOUNTER — Other Ambulatory Visit: Payer: Self-pay

## 2023-02-17 DIAGNOSIS — E1165 Type 2 diabetes mellitus with hyperglycemia: Secondary | ICD-10-CM

## 2023-02-17 DIAGNOSIS — E1129 Type 2 diabetes mellitus with other diabetic kidney complication: Secondary | ICD-10-CM

## 2023-02-17 NOTE — Addendum Note (Signed)
Addended by: Gabriel Earing on: 02/17/2023 02:44 PM   Modules accepted: Orders

## 2023-03-23 ENCOUNTER — Ambulatory Visit: Payer: Medicare Other | Attending: Cardiology

## 2023-03-23 DIAGNOSIS — E785 Hyperlipidemia, unspecified: Secondary | ICD-10-CM

## 2023-03-24 LAB — LIPID PANEL
Chol/HDL Ratio: 2.3 ratio (ref 0.0–5.0)
Cholesterol, Total: 119 mg/dL (ref 100–199)
HDL: 51 mg/dL (ref >39)
LDL Chol Calc (NIH): 47 mg/dL (ref 0–99)
Triglycerides: 117 mg/dL (ref 0–149)
VLDL Cholesterol Cal: 21 mg/dL (ref 5–40)

## 2023-03-24 LAB — HEPATIC FUNCTION PANEL
ALT: 14 IU/L (ref 0–44)
AST: 21 IU/L (ref 0–40)
Albumin: 4.3 g/dL (ref 3.9–4.9)
Alkaline Phosphatase: 76 IU/L (ref 44–121)
Bilirubin Total: 0.2 mg/dL (ref 0.0–1.2)
Bilirubin, Direct: <0.1 mg/dL (ref 0.00–0.40)
Total Protein: 6.7 g/dL (ref 6.0–8.5)

## 2023-04-08 ENCOUNTER — Telehealth: Payer: Self-pay | Admitting: Family Medicine

## 2023-04-08 DIAGNOSIS — E1165 Type 2 diabetes mellitus with hyperglycemia: Secondary | ICD-10-CM

## 2023-04-08 NOTE — Telephone Encounter (Signed)
Pt called stating that he called his patient assistance that he uses to get his [employer]celline Deist and was told that his plan had expired and needs to be renewed. Explained to pt that I would make Raynelle Fanning aware of this and that he needed to bring in his current income documents to renew patient assistance. Pt voiced understanding and said he is out of town but would be back around the middle of August and would bring it to [place] then. Pt says he has Metformin that he can take to last him until he can get back on Farxiga.

## 2023-04-13 MED ORDER — DAPAGLIFLOZIN PROPANEDIOL 10 MG PO TABS: 10.0000 mg | ORAL_TABLET | Freq: Every day | ORAL | 4 refills | Status: DC

## 2023-04-13 NOTE — Telephone Encounter (Signed)
Farxiga escribed to [employer] Let me if new app is needed and send Farxiga benefits-->CKD, CAD given patient's cardiac history and diabetes  Thank you!

## 2023-04-23 ENCOUNTER — Other Ambulatory Visit: Payer: Self-pay | Admitting: Family Medicine

## 2023-04-23 DIAGNOSIS — I2510 Atherosclerotic heart disease of native coronary artery without angina pectoris: Secondary | ICD-10-CM

## 2023-04-23 DIAGNOSIS — F5101 Primary insomnia: Secondary | ICD-10-CM

## 2023-04-23 DIAGNOSIS — E1121 Type 2 diabetes mellitus with diabetic nephropathy: Secondary | ICD-10-CM

## 2023-04-23 DIAGNOSIS — I252 Old myocardial infarction: Secondary | ICD-10-CM

## 2023-04-23 DIAGNOSIS — I700 Atherosclerosis of aorta: Secondary | ICD-10-CM

## 2023-05-02 ENCOUNTER — Ambulatory Visit (INDEPENDENT_AMBULATORY_CARE_PROVIDER_SITE_OTHER): Payer: Medicare Other | Admitting: Family Medicine

## 2023-05-02 ENCOUNTER — Encounter: Payer: Self-pay | Admitting: Family Medicine

## 2023-05-02 VITALS — BP 132/82 | HR 95 | Temp 97.7°F | Ht 68.0 in | Wt 187.2 lb

## 2023-05-02 DIAGNOSIS — N1831 Chronic kidney disease, stage 3a: Secondary | ICD-10-CM

## 2023-05-02 DIAGNOSIS — F5101 Primary insomnia: Secondary | ICD-10-CM

## 2023-05-02 DIAGNOSIS — E1165 Type 2 diabetes mellitus with hyperglycemia: Secondary | ICD-10-CM | POA: Diagnosis not present

## 2023-05-02 DIAGNOSIS — R399 Unspecified symptoms and signs involving the genitourinary system: Secondary | ICD-10-CM

## 2023-05-02 DIAGNOSIS — I4891 Unspecified atrial fibrillation: Secondary | ICD-10-CM | POA: Diagnosis not present

## 2023-05-02 DIAGNOSIS — E1159 Type 2 diabetes mellitus with other circulatory complications: Secondary | ICD-10-CM | POA: Diagnosis not present

## 2023-05-02 DIAGNOSIS — I700 Atherosclerosis of aorta: Secondary | ICD-10-CM

## 2023-05-02 DIAGNOSIS — I2510 Atherosclerotic heart disease of native coronary artery without angina pectoris: Secondary | ICD-10-CM

## 2023-05-02 DIAGNOSIS — E1121 Type 2 diabetes mellitus with diabetic nephropathy: Secondary | ICD-10-CM | POA: Diagnosis not present

## 2023-05-02 DIAGNOSIS — I152 Hypertension secondary to endocrine disorders: Secondary | ICD-10-CM

## 2023-05-02 DIAGNOSIS — I252 Old myocardial infarction: Secondary | ICD-10-CM

## 2023-05-02 LAB — BAYER DCA HB A1C WAIVED: HB A1C (BAYER DCA - WAIVED): 6.3 % — ABNORMAL HIGH (ref 4.8–5.6)

## 2023-05-02 MED ORDER — CLOPIDOGREL BISULFATE 75 MG PO TABS
75.0000 mg | ORAL_TABLET | Freq: Every day | ORAL | 3 refills | Status: DC
Start: 2023-05-02 — End: 2024-07-24

## 2023-05-02 MED ORDER — HYDRALAZINE HCL 50 MG PO TABS
50.0000 mg | ORAL_TABLET | Freq: Three times a day (TID) | ORAL | 3 refills | Status: DC
Start: 2023-05-02 — End: 2024-07-24

## 2023-05-02 MED ORDER — DULOXETINE HCL 30 MG PO CPEP
30.0000 mg | ORAL_CAPSULE | Freq: Every day | ORAL | 3 refills | Status: AC
Start: 2023-05-02 — End: ?

## 2023-05-02 MED ORDER — TAMSULOSIN HCL 0.4 MG PO CAPS
0.4000 mg | ORAL_CAPSULE | Freq: Every day | ORAL | 3 refills | Status: DC
Start: 2023-05-02 — End: 2024-07-24

## 2023-05-02 MED ORDER — MIRTAZAPINE 7.5 MG PO TABS
7.5000 mg | ORAL_TABLET | Freq: Every day | ORAL | 3 refills | Status: DC
Start: 2023-05-02 — End: 2023-05-18

## 2023-05-02 MED ORDER — ATORVASTATIN CALCIUM 40 MG PO TABS
40.0000 mg | ORAL_TABLET | Freq: Every day | ORAL | 3 refills | Status: DC
Start: 2023-05-02 — End: 2024-07-24

## 2023-05-02 MED ORDER — ISOSORBIDE MONONITRATE ER 30 MG PO TB24
ORAL_TABLET | ORAL | 3 refills | Status: DC
Start: 2023-05-02 — End: 2023-12-22

## 2023-05-02 MED ORDER — METOPROLOL TARTRATE 50 MG PO TABS
50.0000 mg | ORAL_TABLET | Freq: Two times a day (BID) | ORAL | 3 refills | Status: AC
Start: 2023-05-02 — End: ?

## 2023-05-02 MED ORDER — APIXABAN 5 MG PO TABS: 5.0000 mg | ORAL_TABLET | Freq: Two times a day (BID) | ORAL | 3 refills | Status: DC

## 2023-05-02 MED ORDER — MIRTAZAPINE 15 MG PO TABS
15.0000 mg | ORAL_TABLET | Freq: Every day | ORAL | 3 refills | Status: DC
Start: 2023-05-02 — End: 2023-05-02

## 2023-05-02 NOTE — Progress Notes (Signed)
Established Patient Office Visit  Subjective   Patient ID: [name] Foster, male    DOB: 1956-05-03  Age: 67 y.o. MRN: [id number]  Chief Complaint  Patient presents with   Medical Management of Chronic Issues   Diabetes   Chronic Kidney Disease   Hypertension    HPI DM Pt presents for follow up evaluation of Type 2 diabetes mellitus.  Patient denies hypoglycemia , increased appetite, nausea, polydipsia, polyuria, visual disturbances, vomiting, and weight loss.  Current diabetic medications include farixga. He has been out of this for the last 3 weeks but just restarted a few days ago.   Current monitoring regimen: not checking Any episodes of hypoglycemia? no  Is He on ACE inhibitor or angiotensin II receptor blocker?  No Is He on statin? Yes   Regular diet.   2. HTN Complaint with meds - Yes Current Medications - amlodipine, hydralazine, HCTZ, metoprolol  Reports feeling dizzy and short of breath some yesterday. His AC was not working a few days ago and it was very hot in his house. He was sweating significantly and didn't feel well for a few days. Feels fine today.    3. HLD Saw cardiology recently. He is on atorvastatin, zetia. Pretty good diet. Some walking  4. CKD Has not followed up with nephrology.   5. Chronic pain Cymbalta helps to take the edge off.   6. Insomnia He has been taking mirtazapine with fair relief. Reports symptoms are stable, up and down.  Sleep has been a struggle for the last month. He is getting 3-4 hours of sleep at night. He sometimes naps during the day. Reports good sleep hygiene.   7. LUTS symptoms Well controled with flomax.      05/02/2023    9:50 AM 01/28/2023   10:37 AM 10/29/2022   10:19 AM  Depression screen PHQ 2/9  Decreased Interest 0 0 0  Down, Depressed, Hopeless 0 0 0  PHQ - 2 Score 0 0 0  Altered sleeping 1 1 2   Tired, decreased energy 1 1 1   Change in appetite 1 0 0  Feeling bad or failure about yourself  0 0 0   Trouble concentrating 0 0 0  Moving slowly or fidgety/restless 0 0 0  Suicidal thoughts 0 0 0  PHQ-9 Score 3 2 3   Difficult doing work/chores Not difficult at all Not difficult at all Not difficult at all      05/02/2023    9:50 AM 01/28/2023   10:37 AM 10/29/2022   10:19 AM 07/29/2022   12:03 PM  GAD 7 : Generalized Anxiety Score  Nervous, Anxious, on Edge 0 0 0 0  Control/stop worrying 0 0 0 0  Worry too much - different things 0 0 0 0  Trouble relaxing 0 0 0 0  Restless 0 0 0 0  Easily annoyed or irritable 0 0 0 0  Afraid - awful might happen 0 0 0 0  Total GAD 7 Score 0 0 0 0  Anxiety Difficulty Not difficult at all Not difficult at all Not difficult at all Not difficult at all     Past Medical History:  Diagnosis Date   Arthritis    CAD (coronary artery disease)    a. 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 DES to left PDA 09/20/17 // c. 10/19: LCx and  LPDA stents ok; s/p DES to pLAD and POBA to D1   Diabetes mellitus without complication (HCC)    type 2    Diabetic nephropathy (HCC)    Echocardiogram 08/2021    Echocardiogram 12/22: EF 60-65, no RWMA, GLS -19.4% (normal), normal RVSF, trivial MR, mild dilation of ascending aorta (37 mm)   Heart attack (HCC)    " a blood clot is actually what caused my heart attack in 2018 " , he states he is unsure where the blood clot originated from    Hyperlipidemia    Hypertension    Insomnia    Pulmonary hypertension (HCC)    a. TTE 03/25/17; EF 60-65%, nl WM, LV diastolic fxn nl, trivial AI, RV cavity size, wall thickness, and sys fxn nl, trivial TR, PASP 39 mmHg, trivial pericardial effusion // Echo 12/22: normal RVSP, PASP   SCCA (squamous cell carcinoma) of skin 03/26/2020   Mid Parietal Scalp (well diff)   Sleep apnea    could not use a cpap   Vitamin B12 deficiency 05/03/2020   Wears dentures     full top-partial bottom   Wears glasses       ROS As per HPI.    Objective:     BP 132/82   Pulse 95   Temp 97.7 F (36.5 C) (Temporal)   Ht 5\' 8"  (1.727 m)   Wt 187 lb 4 oz (84.9 kg)   SpO2 96%   BMI 28.47 kg/m  BP Readings from Last 3 Encounters:  05/02/23 132/82  01/28/23 111/62  11/30/22 134/75   Wt Readings from Last 3 Encounters:  05/02/23 187 lb 4 oz (84.9 kg)  01/28/23 190 lb 3.2 oz (86.3 kg)  11/30/22 193 lb (87.5 kg)      Physical Exam Vitals and nursing note reviewed.  Constitutional:      General: He is not in acute distress.    Appearance: He is not ill-appearing, toxic-appearing or diaphoretic.  HENT:     Nose: Nose normal.     Mouth/Throat:     Mouth: Mucous membranes are moist.     Pharynx: Oropharynx is clear.  Eyes:     General:        Right eye: No discharge.        Left eye: No discharge.     Extraocular Movements: Extraocular movements intact.     Conjunctiva/sclera: Conjunctivae normal.     Pupils: Pupils are equal, round, and reactive to light.  Neck:     Thyroid: No thyroid mass, thyromegaly or thyroid tenderness.     Vascular: No carotid bruit.  Cardiovascular:     Rate and Rhythm: Rhythm irregularly irregular.     Heart sounds: Normal heart sounds. No murmur heard. Pulmonary:     Effort: Pulmonary effort is normal.     Breath sounds: Normal breath sounds.  Abdominal:     General: Bowel sounds are normal. There is no distension.     Palpations: Abdomen is soft.     Tenderness: There is no abdominal tenderness. There is no guarding or rebound.  Musculoskeletal:     Cervical back: Neck supple. No rigidity.     Right lower leg: No edema.     Left lower leg: No edema.  Skin:    General: Skin is warm and dry.  Neurological:     General: No focal deficit present.     Mental Status: He is alert and oriented to person, place, and time.  Psychiatric:  Mood and Affect: Mood normal.        Behavior: Behavior normal.      The ASCVD Risk score (Arnett DK, et al., 2019) failed to calculate for the following reasons:   The patient has a prior MI or stroke diagnosis    Assessment & Plan:   Lawerence was seen today for medical management of chronic issues, diabetes, chronic kidney disease and hypertension.  Diagnoses and all orders for this visit:  New onset atrial fibrillation (HCC) Irregular on exam today. EKG with A. Fib today. Rate controlled. Recently had shortness of breath and dizziness. ? A. Fib vs heat exposure for symptoms. CHADS-VASC score of 4. Discussed in detail with patient. He agrees to anticoagulation. Eliqus 5 mg BID ordered. Samples given today. Urgent referral back to cardiology. Will route note to cardiology as well. Strict return precuations given and discussed when to seek emergency care.  -     EKG 12-Lead -     apixaban (ELIQUIS) 5 MG TABS tablet; Take 1 tablet (5 mg total) by mouth 2 (two) times daily. -     AMB Referral to Pharmacy Medication Management -     Ambulatory referral to Cardiology  Type 2 diabetes mellitus with hyperglycemia, without long-term current use of insulin (HCC) Well controlled. A1c at goal. Referral to pharm for farxiga assistance. On statin.  -     Bayer DCA Hb A1c Waived -     AMB Referral to Pharmacy Medication Management  Hypertension associated with diabetes (HCC) Well controlled on current regimen.  -     metoprolol tartrate (LOPRESSOR) 50 MG tablet; Take 1 tablet (50 mg total) by mouth 2 (two) times daily. -     isosorbide mononitrate (IMDUR) 30 MG 24 hr tablet; TAKE 1/2 OF A TABLET (15 MG TOTAL) BY MOUTH DAILY -     hydrALAZINE (APRESOLINE) 50 MG tablet; Take 1 tablet (50 mg total) by mouth every 8 (eight) hours. -     AMB Referral to Pharmacy Medication Management -     Ambulatory referral to Cardiology  Diabetic nephropathy associated with type 2 diabetes mellitus (HCC) Well controlled on current regimen.  -     DULoxetine (CYMBALTA) 30 MG  capsule; Take 1 capsule (30 mg total) by mouth daily.  Stage 3a chronic kidney disease (HCC) Avoid NSAIDs. On farxiga.  -     BMP8+EGFR  Coronary artery disease involving native coronary artery of native heart without angina pectoris Refills provided. Needs follow up with cardiology. Discontinue aspirin due to starting anticoagulation.  -     clopidogrel (PLAVIX) 75 MG tablet; Take 1 tablet (75 mg total) by mouth daily. -     atorvastatin (LIPITOR) 40 MG tablet; Take 1 tablet (40 mg total) by mouth daily at 6 PM. -     metoprolol tartrate (LOPRESSOR) 50 MG tablet; Take 1 tablet (50 mg total) by mouth 2 (two) times daily. -     isosorbide mononitrate (IMDUR) 30 MG 24 hr tablet; TAKE 1/2 OF A TABLET (15 MG TOTAL) BY MOUTH DAILY -     Ambulatory referral to Cardiology  History of ST elevation myocardial infarction (STEMI) -     clopidogrel (PLAVIX) 75 MG tablet; Take 1 tablet (75 mg total) by mouth daily. -     atorvastatin (LIPITOR) 40 MG tablet; Take 1 tablet (40 mg total) by mouth daily at 6 PM. -     metoprolol tartrate (LOPRESSOR) 50 MG tablet; Take 1 tablet (50 mg  total) by mouth 2 (two) times daily. -     isosorbide mononitrate (IMDUR) 30 MG 24 hr tablet; TAKE 1/2 OF A TABLET (15 MG TOTAL) BY MOUTH DAILY -     Ambulatory referral to Cardiology  Atherosclerosis of aorta (HCC) -     clopidogrel (PLAVIX) 75 MG tablet; Take 1 tablet (75 mg total) by mouth daily. -     atorvastatin (LIPITOR) 40 MG tablet; Take 1 tablet (40 mg total) by mouth daily at 6 PM.  Lower urinary tract symptoms (LUTS) -     tamsulosin (FLOMAX) 0.4 MG CAPS capsule; Take 1 capsule (0.4 mg total) by mouth daily.  Primary insomnia Discussed increaseing remeron, however will hold off on this due to new a. Fib. Continue 7.5 mg daily.  -     mirtazapine (REMERON) 7.5 MG tablet; Take 1 tablet (7.5 mg total) by mouth at bedtime.   Return in about 3 months (around 08/02/2023) for chronic follow up.   The patient  indicates understanding of these issues and agrees with the plan.  Gabriel Earing, FNP

## 2023-05-03 LAB — BMP8+EGFR
BUN/Creatinine Ratio: 18 (ref 10–24)
BUN: 28 mg/dL — ABNORMAL HIGH (ref 8–27)
CO2: 18 mmol/L — ABNORMAL LOW (ref 20–29)
Calcium: 8.8 mg/dL (ref 8.6–10.2)
Chloride: 99 mmol/L (ref 96–106)
Creatinine, Ser: 1.6 mg/dL — ABNORMAL HIGH (ref 0.76–1.27)
Glucose: 176 mg/dL — ABNORMAL HIGH (ref 70–99)
Potassium: 3.9 mmol/L (ref 3.5–5.2)
Sodium: 136 mmol/L (ref 134–144)
eGFR: 47 mL/min/{1.73_m2} — ABNORMAL LOW (ref >59)

## 2023-05-05 ENCOUNTER — Ambulatory Visit: Payer: Medicare Other | Attending: Cardiovascular Disease | Admitting: Cardiovascular Disease

## 2023-05-05 ENCOUNTER — Encounter: Payer: Self-pay | Admitting: Cardiovascular Disease

## 2023-05-05 VITALS — BP 136/78 | HR 78 | Ht 68.0 in | Wt 187.8 lb

## 2023-05-05 DIAGNOSIS — I2510 Atherosclerotic heart disease of native coronary artery without angina pectoris: Secondary | ICD-10-CM | POA: Diagnosis not present

## 2023-05-05 DIAGNOSIS — E785 Hyperlipidemia, unspecified: Secondary | ICD-10-CM

## 2023-05-05 DIAGNOSIS — N1831 Chronic kidney disease, stage 3a: Secondary | ICD-10-CM | POA: Diagnosis not present

## 2023-05-05 DIAGNOSIS — Z01810 Encounter for preprocedural cardiovascular examination: Secondary | ICD-10-CM

## 2023-05-05 DIAGNOSIS — I10 Essential (primary) hypertension: Secondary | ICD-10-CM | POA: Diagnosis not present

## 2023-05-05 NOTE — H&P (View-Only) (Signed)
Cardiology Office Note:  .   Date:  05/05/2023  ID:  JAMES GUARDADO, DOB 1956-09-07, MRN [id number] PCP: Gabriel Earing, FNP  [facility] Providers Cardiologist:  Tonny Bollman, MD Cardiology APP:  Beatrice Lecher, PA-C    History of Present Illness: .   [name] Foster is a 67 y.o. male with:  Coronary artery disease  S/p Inf-Post STEMI 03/2017 >> PCI:  DES to LCx  S/p DES to the L PDA in 09/2017 2/2 to [place] S/p NSTEMI 06/2018 >> PCI:  Orbital atherectomy and DES to LAD Diabetes mellitus  Hypertension  Hyperlipidemia  Chronic kidney disease OSA (intol of CPAP)  S/p R THR in 04/2019 Atrial fibrillation diagnosed August 2024  The patient recently returned from [place] and came into his house which was very hot.  He began to feel poorly with diaphoresis, weakness, and "heart flutters."  He felt a little better but went to see his primary care physician a few days ago and he was diagnosed with atrial fibrillation as a new diagnosis.  He was appropriately started on apixaban.  His ventricular rate is controlled on metoprolol 50 mg twice daily.  He feels better but still is experiencing a degree of exertional dyspnea and fatigue.  He reports no bleeding problems on apixaban.  He denies chest pain or pressure, orthopnea, PND, or leg swelling.  He has no past history of atrial fibrillation before his recent diagnosis.  ROS: Fatigue, exertional dyspnea  Studies Reviewed: .         Risk Assessment/Calculations:             Physical Exam:   VS:  BP 136/78   Pulse 78   Ht 5\' 8"  (1.727 m)   Wt 187 lb 12.8 oz (85.2 kg)   SpO2 95%   BMI 28.55 kg/m    Wt Readings from Last 3 Encounters:  05/05/23 187 lb 12.8 oz (85.2 kg)  05/02/23 187 lb 4 oz (84.9 kg)  01/28/23 190 lb 3.2 oz (86.3 kg)    GEN: Well nourished, well developed in no acute distress NECK: No JVD; No carotid bruits CARDIAC: irregularly irregular, no murmurs, rubs, gallops RESPIRATORY:  Clear to  auscultation without rales, wheezing or rhonchi  ABDOMEN: Soft, non-tender, non-distended EXTREMITIES:  No edema; No deformity   ASSESSMENT AND PLAN: .   1.  CAD, native vessel, with angina: The patient remains on antiplatelet monotherapy with clopidogrel.  His antianginal regimen includes amlodipine, isosorbide, and metoprolol. 2.  Hypertension: Appears well-controlled on amlodipine, hydralazine, and hydrochlorothiazide. 3. New onset atrial fibrillation: discussed the diagnosis, implications with symptoms of dyspnea and fatigue, stroke risk, and treatment options.  Recommended that we check a 2D echocardiogram, update labs to include a CBC and TSH (recent metabolic panel performed and reviewed).  Once his studies are completed, he will be referred for elective cardioversion after 3 weeks of therapeutic anticoagulation with apixaban.  He started this on Monday, August 19.  I reviewed the risks, indications, and alternatives to elective cardioversion with the patient.  He understands and agrees to proceed.  He understands that if he has recurrence of A-fib after cardioversion he will be referred for consideration of antiarrhythmic drug therapy or A-fib ablation.  Post cardioversion, will arrange follow-up in the atrial fibrillation clinic.    Informed Consent   Shared Decision Making/Informed Consent The risks (stroke, cardiac arrhythmias rarely resulting in the need for a temporary or permanent pacemaker, skin irritation or burns and complications  associated with conscious sedation including aspiration, arrhythmia, respiratory failure and death), benefits (restoration of normal sinus rhythm) and alternatives of a direct current cardioversion were explained in detail to Mr. Huaman and he agrees to proceed.       Dispo: Labs and echo ordered, cardioversion after 3 weeks of anticoagulation, follow-up in atrial fibrillation clinic.  Signed, Tonny Bollman, MD

## 2023-05-05 NOTE — Patient Instructions (Addendum)
Medication Instructions:  Your physician recommends that you continue on your current medications as directed. Please refer to the Current Medication list given to you today.  *If you need a refill on your cardiac medications before your next appointment, please call your pharmacy*  Lab Work: CBC, TSH today If you have labs (blood work) drawn today and your tests are completely normal, you will receive your results only by: MyChart Message (if you have MyChart) OR A paper copy in the mail If you have any lab test that is abnormal or we need to change your treatment, we will call you to review the results.  Testing/Procedures: ECHO Your physician has requested that you have an echocardiogram. Echocardiography is a painless test that uses sound waves to create images of your heart. It provides your doctor with information about the size and shape of your heart and how well your heart's chambers and valves are working. This procedure takes approximately one hour. There are no restrictions for this procedure. Please do NOT wear cologne, perfume, aftershave, or lotions (deodorant is allowed). Please arrive 15 minutes prior to your appointment time.  Cardioversion Your physician has recommended that you have a Cardioversion (DCCV). Electrical Cardioversion uses a jolt of electricity to your heart either through paddles or wired patches attached to your chest. This is a controlled, usually prescheduled, procedure. Defibrillation is done under light anesthesia in the hospital, and you usually go home the day of the procedure. This is done to get your heart back into a normal rhythm. You are not awake for the procedure. Please see the instruction sheet given to you today.   Follow-Up: At [facility], you and your health needs are our priority.  As part of our continuing mission to provide you with exceptional heart care, we have created designated Provider Care Teams.  These Care Teams  include your primary Cardiologist (physician) and Advanced Practice Providers (APPs -  Physician Assistants and Nurse Practitioners) who all work together to provide you with the care you need, when you need it.   Your next appointment:   6 month(s)  Provider:   Lorn Junes, or [place]     Other Instructions     Dear Kristen Loader  You are scheduled for a Cardioversion on Monday, September 16 with Dr. Eden Emms.  Please arrive at the [facility] (Main Entrance A) at [facility]: [address] [place], [place] [address] at 11:00 AM (This time is one hour(s) before your procedure to ensure your preparation). Free valet parking service is available. You will check in at ADMITTING. The support person will be asked to wait in the waiting room.  It is OK to have someone drop you off and come back when you are ready to be discharged.      DIET:  Nothing to eat or drink after midnight except a sip of water with medications (see medication instructions below)  MEDICATION INSTRUCTIONS: !!IF ANY NEW MEDICATIONS ARE STARTED AFTER TODAY, PLEASE NOTIFY YOUR PROVIDER AS SOON AS POSSIBLE!!  FYI: Medications such as Semaglutide (Ozempic, [place]), Tirzepatide (Mounjaro, Zepbound), Dulaglutide (Trulicity), etc ("GLP1 agonists") AND Canagliflozin (Invokana), Dapagliflozin (Farxiga), Empagliflozin (Jardiance), Ertugliflozin (Steglatro), Bexagliflozin [employer]) or any combination with one of these drugs such as Invokamet (Canagliflozin/Metformin), Synjardy (Empagliflozin/Metformin), etc ("SGLT2 inhibitors") must be held around the time of a procedure. This is not a comprehensive list of all of these drugs. Please review all of your medications and talk to your provider if you  take any one of these. If you are not sure, ask your provider.   HOLD: Dapagliflozin Marcelline Deist) for 3 days prior to the procedure. Last dose on Thursday, September 12.  HOLD hydrochlorothiazide the morning of  procedure. Continue taking your anticoagulant (blood thinner): Apixaban (Eliquis).  You will need to continue this after your procedure until you are told by your provider that it is safe to stop.    LABS: Updated 8/19  FYI:  For your safety, and to allow [place] to monitor your vital signs accurately during the surgery/procedure we request: If you have artificial nails, gel coating, SNS etc, please have those removed prior to your surgery/procedure. Not having the nail coverings /polish removed may result in cancellation or delay of your surgery/procedure.  You must have a responsible person to drive you home and stay in the waiting area during your procedure. Failure to do so could result in cancellation.  Bring your insurance cards.  *Special Note: Every effort is made to have your procedure done on time. Occasionally there are emergencies that occur at the hospital that may cause delays. Please be patient if a delay does occur.

## 2023-05-05 NOTE — Progress Notes (Signed)
Cardiology Office Note:  .   Date:  05/05/2023  ID:  [name] Foster, DOB 08-Aug-1956, MRN [id number] PCP: Gabriel Earing, FNP  [facility] Providers Cardiologist:  Tonny Bollman, MD Cardiology APP:  Beatrice Lecher, PA-C    History of Present Illness: .   [name] Foster is a 67 y.o. male with:  Coronary artery disease  S/p Inf-Post STEMI 03/2017 >> PCI:  DES to LCx  S/p DES to the L PDA in 09/2017 2/2 to [place] S/p NSTEMI 06/2018 >> PCI:  Orbital atherectomy and DES to LAD Diabetes mellitus  Hypertension  Hyperlipidemia  Chronic kidney disease OSA (intol of CPAP)  S/p R THR in 04/2019 Atrial fibrillation diagnosed August 2024  The patient recently returned from [place] and came into his house which was very hot.  He began to feel poorly with diaphoresis, weakness, and "heart flutters."  He felt a little better but went to see his primary care physician a few days ago and he was diagnosed with atrial fibrillation as a new diagnosis.  He was appropriately started on apixaban.  His ventricular rate is controlled on metoprolol 50 mg twice daily.  He feels better but still is experiencing a degree of exertional dyspnea and fatigue.  He reports no bleeding problems on apixaban.  He denies chest pain or pressure, orthopnea, PND, or leg swelling.  He has no past history of atrial fibrillation before his recent diagnosis.  ROS: Fatigue, exertional dyspnea  Studies Reviewed: .         Risk Assessment/Calculations:             Physical Exam:   VS:  BP 136/78   Pulse 78   Ht 5\' 8"  (1.727 m)   Wt 187 lb 12.8 oz (85.2 kg)   SpO2 95%   BMI 28.55 kg/m    Wt Readings from Last 3 Encounters:  05/05/23 187 lb 12.8 oz (85.2 kg)  05/02/23 187 lb 4 oz (84.9 kg)  01/28/23 190 lb 3.2 oz (86.3 kg)    GEN: Well nourished, well developed in no acute distress NECK: No JVD; No carotid bruits CARDIAC: irregularly irregular, no murmurs, rubs, gallops RESPIRATORY:  Clear to  auscultation without rales, wheezing or rhonchi  ABDOMEN: Soft, non-tender, non-distended EXTREMITIES:  No edema; No deformity   ASSESSMENT AND PLAN: .   1.  CAD, native vessel, with angina: The patient remains on antiplatelet monotherapy with clopidogrel.  His antianginal regimen includes amlodipine, isosorbide, and metoprolol. 2.  Hypertension: Appears well-controlled on amlodipine, hydralazine, and hydrochlorothiazide. 3. New onset atrial fibrillation: discussed the diagnosis, implications with symptoms of dyspnea and fatigue, stroke risk, and treatment options.  Recommended that we check a 2D echocardiogram, update labs to include a CBC and TSH (recent metabolic panel performed and reviewed).  Once his studies are completed, he will be referred for elective cardioversion after 3 weeks of therapeutic anticoagulation with apixaban.  He started this on Monday, August 19.  I reviewed the risks, indications, and alternatives to elective cardioversion with the patient.  He understands and agrees to proceed.  He understands that if he has recurrence of A-fib after cardioversion he will be referred for consideration of antiarrhythmic drug therapy or A-fib ablation.  Post cardioversion, will arrange follow-up in the atrial fibrillation clinic.    Informed Consent   Shared Decision Making/Informed Consent The risks (stroke, cardiac arrhythmias rarely resulting in the need for a temporary or permanent pacemaker, skin irritation or burns and complications  associated with conscious sedation including aspiration, arrhythmia, respiratory failure and death), benefits (restoration of normal sinus rhythm) and alternatives of a direct current cardioversion were explained in detail to Mr. Mcgeorge and he agrees to proceed.       Dispo: Labs and echo ordered, cardioversion after 3 weeks of anticoagulation, follow-up in atrial fibrillation clinic.  Signed, Tonny Bollman, MD

## 2023-05-06 ENCOUNTER — Telehealth: Payer: Self-pay | Admitting: Pharmacist

## 2023-05-06 DIAGNOSIS — E1165 Type 2 diabetes mellitus with hyperglycemia: Secondary | ICD-10-CM

## 2023-05-06 LAB — CBC
Hematocrit: 43 % (ref 37.5–51.0)
Hemoglobin: 14.7 g/dL (ref 13.0–17.7)
MCH: 29.6 pg (ref 26.6–33.0)
MCHC: 34.2 g/dL (ref 31.5–35.7)
MCV: 87 fL (ref 79–97)
Platelets: 157 10*3/uL (ref 150–450)
RBC: 4.97 x10E6/uL (ref 4.14–5.80)
RDW: 13.8 % (ref 11.6–15.4)
WBC: 5.2 10*3/uL (ref 3.4–10.8)

## 2023-05-06 LAB — TSH: TSH: 1.02 u[IU]/mL (ref 0.450–4.500)

## 2023-05-06 MED ORDER — DAPAGLIFLOZIN PROPANEDIOL 10 MG PO TABS
10.0000 mg | ORAL_TABLET | Freq: Every day | ORAL | 4 refills | Status: DC
Start: 2023-05-06 — End: 2023-06-09

## 2023-05-06 NOTE — Telephone Encounter (Signed)
    05/06/2023 Name: [name] Foster MRN: [id number] DOB: 10/14/1955  [place] for Farxiga 2024.  Updated RX escribed to [facility] mail order (pharmacy for [place] patient assistance).  Patient is stable on current regimen.  He will need to re-enroll for 2025 in November.  Lab Results  Component Value Date   HGBA1C 6.3 (H) 05/02/2023    Clinical Atherosclerotic Cardiovascular Disease (ASCVD): Yes   The ASCVD Risk score (Arnett DK, et al., 2019) failed to calculate for the following reasons:   The patient has a prior MI or stroke diagnosis   CKD3a--GFR 47  Continue current regimen Follow up to re-enroll at the end of 2024 for Farxiga PAP next year   Kieth Brightly, PharmD, BCACP Clinical Pharmacist, [facility]

## 2023-05-11 ENCOUNTER — Telehealth: Payer: Self-pay

## 2023-05-11 ENCOUNTER — Other Ambulatory Visit: Payer: Self-pay | Admitting: Family Medicine

## 2023-05-11 ENCOUNTER — Other Ambulatory Visit: Payer: Medicare Other

## 2023-05-11 DIAGNOSIS — N1831 Chronic kidney disease, stage 3a: Secondary | ICD-10-CM

## 2023-05-11 NOTE — Progress Notes (Signed)
   Care Guide Note  05/11/2023 Name: [name] Foster MRN: [id number] DOB: 06-11-1956  Referred by: Gabriel Earing, FNP Reason for referral : Care Coordination (Outreach to schedule with Pharm d )   [name] Foster is a 67 y.o. year old male who is a primary care patient of Gabriel Earing, FNP. Kristen Loader was referred to the pharmacist for assistance related to HTN, HLD, and DM.    Successful contact was made with the patient to discuss pharmacy services including being ready for the pharmacist to call at least 5 minutes before the scheduled appointment time, to have medication bottles and any blood sugar or blood pressure readings ready for review. The patient agreed to meet with the pharmacist via with the pharmacist via telephone visit on (date/time).  05/24/2023  Penne Lash, RMA Care Guide [facility]  [place], [place] [address] Direct Dial: [phone] [email or web address]

## 2023-05-11 NOTE — Progress Notes (Unsigned)
   Care Guide Note  05/11/2023 Name: [name] Foster MRN: [id number] DOB: 07/20/1956  Referred by: Gabriel Earing, FNP Reason for referral : Care Management (Outreach to Reschedule with Pharm d due to schedule error )   [name] Foster is a 67 y.o. year old male who is a primary care patient of Gabriel Earing, FNP. Kristen Loader was referred to the pharmacist for assistance related to DM.    An unsuccessful telephone outreach was attempted today to contact the patient who was referred to the pharmacy team for assistance with medication management. Additional attempts will be made to contact the patient.   Penne Lash, RMA Care Guide [facility]  [place], [place] [address] Direct Dial: [phone] [email or web address]

## 2023-05-12 ENCOUNTER — Ambulatory Visit (INDEPENDENT_AMBULATORY_CARE_PROVIDER_SITE_OTHER): Payer: Medicare Other

## 2023-05-12 VITALS — Ht 69.0 in | Wt 187.0 lb

## 2023-05-12 DIAGNOSIS — Z0000 Encounter for general adult medical examination without abnormal findings: Secondary | ICD-10-CM | POA: Diagnosis not present

## 2023-05-12 LAB — BMP8+EGFR
BUN/Creatinine Ratio: 11 (ref 10–24)
BUN: 14 mg/dL (ref 8–27)
CO2: 20 mmol/L (ref 20–29)
Calcium: 9 mg/dL (ref 8.6–10.2)
Chloride: 101 mmol/L (ref 96–106)
Creatinine, Ser: 1.23 mg/dL (ref 0.76–1.27)
Glucose: 106 mg/dL — ABNORMAL HIGH (ref 70–99)
Potassium: 4.2 mmol/L (ref 3.5–5.2)
Sodium: 140 mmol/L (ref 134–144)
eGFR: 64 mL/min/{1.73_m2} (ref >59)

## 2023-05-12 NOTE — Patient Instructions (Signed)
[name] Foster , Thank you for taking time to come for your Medicare Wellness Visit. I appreciate your ongoing commitment to your health goals. Please review the following plan we discussed and let me know if I can assist you in the future.   Referrals/Orders/Follow-Ups/Clinician Recommendations: Aim for 30 minutes of exercise or brisk walking, 6-8 glasses of water, and 5 servings of fruits and vegetables each day.   This is a list of the screening recommended for you and due dates:  Health Maintenance  Topic Date Due   COVID-19 Vaccine (4 - 2023-24 season) 08/15/2023*   Screening for Lung Cancer  10/30/2023*   Flu Shot  12/12/2023*   Colon Cancer Screening  05/01/2024*   Complete foot exam   07/30/2023   Hemoglobin A1C  11/02/2023   Eye exam for diabetics  12/01/2023   Yearly kidney health urinalysis for diabetes  01/28/2024   Yearly kidney function blood test for diabetes  05/10/2024   Medicare Annual Wellness Visit  05/11/2024   DTaP/Tdap/Td vaccine (2 - Td or Tdap) 04/10/2029   Pneumonia Vaccine  Completed   Hepatitis C Screening  Completed   Zoster (Shingles) Vaccine  Completed   HPV Vaccine  Aged Out  *Topic was postponed. The date shown is not the original due date.    Advanced directives: (Provided) Advance directive discussed with you today. I have provided a copy for you to complete at home and have notarized. Once this is complete, please bring a copy in to our office so we can scan it into your chart. Information on Advanced Care Planning can be found at [facility] Advance Health Care Directives Advance Health Care Directives ([email or web address]    Next Medicare Annual Wellness Visit scheduled for next year: Yes  insert Preventive Care attachment Insert FALL PREVENTION attachment if needed

## 2023-05-12 NOTE — Progress Notes (Signed)
Subjective:   [name] Foster is a 67 y.o. male who presents for Medicare Annual/Subsequent preventive examination.  Visit Complete: Virtual  I connected with  Kristen Loader on 05/12/23 by a audio enabled telemedicine application and verified that I am speaking with the correct person using two identifiers.  Patient Location: Home  Provider Location: Home Office  I discussed the limitations of evaluation and management by telemedicine. The patient expressed understanding and agreed to proceed.  Patient Medicare AWV questionnaire was completed by the patient on 05/12/2023; I have confirmed that all information answered by patient is correct and no changes since this date.  Review of Systems    Vital Signs: Unable to obtain new vitals due to this being a telehealth visit.  Cardiac Risk Factors include: advanced age (>52men, >61 women);diabetes mellitus;dyslipidemia;male gender;hypertension Nutrition Risk Assessment:  Has the patient had any N/V/D within the last 2 months?  No  Does the patient have any non-healing wounds?  No  Has the patient had any unintentional weight loss or weight gain?  No   Diabetes:  Is the patient diabetic?  Yes  If diabetic, was a CBG obtained today?  No  Did the patient bring in their glucometer from home?   no How often do you monitor your CBG's? Daily   Financial Strains and Diabetes Management:  Are you having any financial strains with the device, your supplies or your medication? No .  Does the patient want to be seen by Chronic Care Management for management of their diabetes?  No  Would the patient like to be referred to a Nutritionist or for Diabetic Management?  No   Diabetic Exams:  Diabetic Eye Exam: Completed 10/2022 Diabetic Foot Exam: Overdue, Pt has been advised about the importance in completing this exam. Pt is scheduled for diabetic foot exam on next office visit .     Objective:    Today's Vitals   05/12/23 1448   Weight: 187 lb (84.8 kg)  Height: 5\' 9"  (1.753 m)   Body mass index is 27.62 kg/m.     05/12/2023    2:54 PM 05/10/2022    2:51 PM 02/12/2022   12:04 PM 02/10/2022    4:38 AM 02/09/2022    9:35 PM 01/29/2022   11:29 AM 01/14/2022    9:57 AM  Advanced Directives  Does Patient Have a Medical Advance Directive? No No No No No No No  Would patient like information on creating a medical advance directive? Yes (MAU/Ambulatory/Procedural Areas - Information given) No - Patient declined No - Patient declined No - Patient declined  No - Patient declined No - Patient declined    Current Medications (verified) Outpatient Encounter Medications as of 05/12/2023  Medication Sig   albuterol (VENTOLIN HFA) 108 (90 Base) MCG/ACT inhaler TAKE 2 PUFFS BY MOUTH EVERY 6 HOURS AS NEEDED FOR WHEEZE OR SHORTNESS OF BREATH   amLODipine (NORVASC) 5 MG tablet Take 1 tablet (5 mg total) by mouth daily.   apixaban (ELIQUIS) 5 MG TABS tablet Take 1 tablet (5 mg total) by mouth 2 (two) times daily.   atorvastatin (LIPITOR) 40 MG tablet Take 1 tablet (40 mg total) by mouth daily at 6 PM.   clopidogrel (PLAVIX) 75 MG tablet Take 1 tablet (75 mg total) by mouth daily.   Coenzyme Q10 (COQ-10) 100 MG CAPS Take 100 mg by mouth daily.   dapagliflozin propanediol (FARXIGA) 10 MG TABS tablet Take 1 tablet (10 mg total) by mouth daily  before breakfast.   DULoxetine (CYMBALTA) 30 MG capsule Take 1 capsule (30 mg total) by mouth daily.   ezetimibe (ZETIA) 10 MG tablet TAKE 1 TABLET BY MOUTH EVERY DAY   folic acid (FOLVITE) 1 MG tablet Take 1 tablet (1 mg total) by mouth daily.   hydrALAZINE (APRESOLINE) 50 MG tablet Take 1 tablet (50 mg total) by mouth every 8 (eight) hours.   hydrochlorothiazide (HYDRODIURIL) 12.5 MG tablet Take 1 tablet (12.5 mg total) by mouth daily. (NEEDS TO BE SEEN BEFORE NEXT REFILL)   isosorbide mononitrate (IMDUR) 30 MG 24 hr tablet TAKE 1/2 OF A TABLET (15 MG TOTAL) BY MOUTH DAILY   metoprolol tartrate  (LOPRESSOR) 50 MG tablet Take 1 tablet (50 mg total) by mouth 2 (two) times daily.   mirtazapine (REMERON) 7.5 MG tablet Take 1 tablet (7.5 mg total) by mouth at bedtime.   nitroGLYCERIN (NITROSTAT) 0.4 MG SL tablet DISSOLVE 1 TAB UNDER TONGUE AT ONSET OF CHEST PAIN. REPEAT EVERY 5 MINUTES FOR 2 DOSES AS NEEDED   RESTASIS 0.05 % ophthalmic emulsion Place 1 drop into both eyes daily.   Simethicone (GAS-X PO) Take 1 tablet by mouth daily.   tamsulosin (FLOMAX) 0.4 MG CAPS capsule Take 1 capsule (0.4 mg total) by mouth daily.   vitamin B-12 (CYANOCOBALAMIN) 500 MCG tablet Take 1 tablet (500 mcg total) by mouth daily.   Vitamin D, Ergocalciferol, (DRISDOL) 1.25 MG (50000 UNIT) CAPS capsule TAKE 1 CAPSULE (50,000 UNITS TOTAL) BY MOUTH EVERY 7 (SEVEN) DAYS   No facility-administered encounter medications on file as of 05/12/2023.    Allergies (verified) Patient has no known allergies.   History: Past Medical History:  Diagnosis Date   Arthritis    CAD (coronary artery disease)    a. 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 DES to left PDA 09/20/17 // c. 10/19: LCx and LPDA stents ok; s/p DES to pLAD and POBA to D1   Diabetes mellitus without complication (HCC)    type 2    Diabetic nephropathy (HCC)    Echocardiogram 08/2021    Echocardiogram 12/22: EF 60-65, no RWMA, GLS -19.4% (normal), normal RVSF, trivial MR, mild dilation of ascending aorta (37 mm)   Heart attack (HCC)    " a blood clot is actually what caused my heart attack in 2018 " , he states he is unsure where the blood clot originated from    Hyperlipidemia    Hypertension    Insomnia    Pulmonary hypertension (HCC)    a. TTE 03/25/17; EF 60-65%, nl WM, LV diastolic fxn nl, trivial AI, RV cavity size, wall thickness, and sys fxn nl, trivial TR, PASP 39 mmHg, trivial pericardial effusion // Echo 12/22: normal  RVSP, PASP   SCCA (squamous cell carcinoma) of skin 03/26/2020   Mid Parietal Scalp (well diff)   Sleep apnea    could not use a cpap   Vitamin B12 deficiency 05/03/2020   Wears dentures    full top-partial bottom   Wears glasses    Past Surgical History:  Procedure Laterality Date   COLONOSCOPY  05/13/2010   Single anal papilla, 2 diminutive polyps at 10 cm, left-sided diverticula, one sigmoid and one a sending polyp, otherwise normal colon and TI.  Path with 1 tubular adenoma, otherwise hyperplastic polyps.  Recommendations repeat colonoscopy in 7 years.   COLONOSCOPY  WITH PROPOFOL N/A 11/22/2019   Rourk, Gerrit Friends, MD -  ten 4-9 mm polyps in the rectum, sigmoid colon and cecum were removed, three 12-25 mm polyps at the splenic flexure and hepatic flexure removed with clip placement to the largest polyp   COLONOSCOPY WITH PROPOFOL N/A 01/14/2022   Procedure: COLONOSCOPY WITH PROPOFOL;  Surgeon: Corbin Ade, MD;  [facility];  Service: Endoscopy;  Laterality: N/A;  11:15am, asa 3   CORONARY ATHERECTOMY N/A 07/03/2018   Procedure: CORONARY ATHERECTOMY;  Surgeon: Kathleene Hazel, MD;  Location: [facility];  Service: Cardiovascular;  Laterality: N/A;   CORONARY PRESSURE/FFR STUDY N/A 09/20/2017   Procedure: INTRAVASCULAR PRESSURE WIRE/FFR STUDY;  Surgeon: Marykay Lex, MD;  Location: [facility] INVASIVE CV LAB;  Service: Cardiovascular;  Laterality: N/A;   CORONARY PRESSURE/FFR STUDY N/A 06/30/2018   Procedure: INTRAVASCULAR PRESSURE WIRE/FFR STUDY;  Surgeon: Yvonne Kendall, MD;  Location: [facility];  Service: Cardiovascular;  Laterality: N/A;   CORONARY STENT INTERVENTION N/A 09/20/2017   Procedure: CORONARY STENT INTERVENTION;  Surgeon: Marykay Lex, MD;  Location: [facility] INVASIVE CV LAB;  Service: Cardiovascular;  Laterality: N/A;   CORONARY STENT INTERVENTION N/A 07/03/2018   Procedure: CORONARY STENT INTERVENTION;  Surgeon: Kathleene Hazel,  MD;  Location: [facility];  Service: Cardiovascular;  Laterality: N/A;   CORONARY/GRAFT ACUTE MI REVASCULARIZATION N/A 03/25/2017   Procedure: Coronary/Graft Acute MI Revascularization;  Surgeon: Tonny Bollman, MD;  Location: [facility] INVASIVE CV LAB;  Service: Cardiovascular;  Laterality: N/A;   GANGLION CYST EXCISION Left 07/25/2014   Procedure: EXCISION VOLAR AND DORSAL GANGLION LEFT WRIST;  Surgeon: Betha Loa, MD;  [facility];  Service: Orthopedics;  Laterality: Left;   KNEE ARTHROSCOPY     rifgr and left   LEFT HEART CATH AND CORONARY ANGIOGRAPHY N/A 03/25/2017   Procedure: Left Heart Cath and Coronary Angiography;  Surgeon: Tonny Bollman, MD;  Location: [facility] INVASIVE CV LAB;  Service: Cardiovascular;  Laterality: N/A;   LEFT HEART CATH AND CORONARY ANGIOGRAPHY N/A 09/20/2017   Procedure: LEFT HEART CATH AND CORONARY ANGIOGRAPHY;  Surgeon: Marykay Lex, MD;  Location: [facility] INVASIVE CV LAB;  Service: Cardiovascular;  Laterality: N/A;   LEFT HEART CATH AND CORONARY ANGIOGRAPHY N/A 06/30/2018   Procedure: LEFT HEART CATH AND CORONARY ANGIOGRAPHY;  Surgeon: Yvonne Kendall, MD;  Location: [facility];  Service: Cardiovascular;  Laterality: N/A;   POLYPECTOMY  11/22/2019   Procedure: POLYPECTOMY;  Surgeon: Corbin Ade, MD;  [facility];  Service: Endoscopy;;   POLYPECTOMY  01/14/2022   Procedure: POLYPECTOMY;  Surgeon: Corbin Ade, MD;  [facility];  Service: Endoscopy;;   SHOULDER ARTHROSCOPY  2012   left   SHOULDER ARTHROSCOPY     right   TEMPORARY PACEMAKER N/A 03/25/2017   Procedure: Temporary Pacemaker;  Surgeon: Tonny Bollman, MD;  Location: [facility] INVASIVE CV LAB;  Service: Cardiovascular;  Laterality: N/A;   TONSILLECTOMY     TOTAL HIP ARTHROPLASTY Right 04/25/2019   TOTAL HIP ARTHROPLASTY Right 04/25/2019   Procedure: TOTAL HIP ARTHROPLASTY ANTERIOR APPROACH;  Surgeon: Samson Frederic, MD;  Location: [facility];   Service: Orthopedics;  Laterality: Right;   TOTAL KNEE ARTHROPLASTY  2002   left   Family History  Problem Relation Age of Onset   Hypertension Mother    Hypertension Father    Hypertension Brother    Heart disease Maternal Grandfather    Colon cancer  Neg Hx    Social History   Socioeconomic History   Marital status: Widowed    Spouse name: Not on file   Number of children: 2   Years of education: Not on file   Highest education level: Not on file  Occupational History   Occupation: Retired  Tobacco Use   Smoking status: Former    Current packs/day: 0.00    Average packs/day: 2.0 packs/day for 30.0 years (60.0 ttl pk-yrs)    Types: Cigarettes    Start date: 03/26/1987    Quit date: 03/25/2017    Years since quitting: 6.1   Smokeless tobacco: Never  Vaping Use   Vaping status: Never Used  Substance and Sexual Activity   Alcohol use: Yes    Comment: 3-6 shots of liquor daily   Drug use: Not Currently    Types: Marijuana    Comment: last used about 10 years ago (08/23/19)   Sexual activity: Not Currently  Other Topics Concern   Not on file  Social History Narrative   Per wife, pt was preparing to retire soon from his job at [employer].   May 17, 2021 - wife passed away a couple weeks ago - he lives alone, but has lot of family nearby   Son next door, daughter lives in [place]   Social Determinants of Health   Financial Resource Strain: Low Risk  (05/12/2023)   Overall Financial Resource Strain (CARDIA)    Difficulty of Paying Living Expenses: Not hard at all  Food Insecurity: No Food Insecurity (05/12/2023)   Hunger Vital Sign    Worried About Running Out of Food in the Last Year: Never true    Ran Out of Food in the Last Year: Never true  Transportation Needs: No Transportation Needs (05/12/2023)   PRAPARE - Administrator, Civil Service (Medical): No    Lack of Transportation (Non-Medical): No  Physical Activity: Inactive (05/12/2023)   Exercise Vital Sign     Days of Exercise per Week: 0 days    Minutes of Exercise per Session: 0 min  Stress: No Stress Concern Present (05/12/2023)   [employer] of Occupational Health - Occupational Stress Questionnaire    Feeling of Stress : Not at all  Social Connections: Socially Isolated (05/12/2023)   Social Connection and Isolation Panel [NHANES]    Frequency of Communication with Friends and Family: More than three times a week    Frequency of Social Gatherings with Friends and Family: More than three times a week    Attends Religious Services: Never    Database administrator or Organizations: No    Attends Banker Meetings: Never    Marital Status: Widowed    Tobacco Counseling Counseling given: Not Answered   Clinical Intake:  Pre-visit preparation completed: Yes  Pain : No/denies pain     Nutritional Risks: None Diabetes: No  How often do you need to have someone help you when you read instructions, pamphlets, or other written materials from your doctor or pharmacy?: 1 - Never  Interpreter Needed?: No  Information entered by :: Renie Ora, LPN   Activities of Daily Living    05/12/2023    2:55 PM  In your present state of health, do you have any difficulty performing the following activities:  Hearing? 0  Vision? 0  Difficulty concentrating or making decisions? 0  Walking or climbing stairs? 0  Dressing or bathing? 0  Doing errands, shopping? 0  Preparing Food and eating ?  N  Using the Toilet? N  In the past six months, have you accidently leaked urine? N  Do you have problems with loss of bowel control? N  Managing your Medications? N  Managing your Finances? N  Housekeeping or managing your Housekeeping? N    Patient Care Team: Gabriel Earing, FNP as PCP - General (Family Medicine) Tonny Bollman, MD as PCP - Cardiology (Cardiology) [facility] as Consulting Physician (Orthopedic Surgery) Janalyn Harder, MD (Inactive) as Consulting Physician  (Dermatology) Kennon Rounds as Physician Assistant (Cardiology) Michaelle Copas, MD as Referring Physician (Optometry) Danella Maiers, [facility] as Pharmacist (Family Medicine) Doreatha Massed, MD as Medical Oncologist (Hematology)  Indicate any recent Medical Services you may have received from other than [facility] providers in the past year (date may be approximate).     Assessment:   This is a routine wellness examination for Waylynn.  Hearing/Vision screen Vision Screening - Comments:: Wears rx glasses - up to date with routine eye exams with  Dr.Le   Dietary issues and exercise activities discussed:     Goals Addressed             This Visit's Progress    DIET - INCREASE WATER INTAKE         Depression Screen    05/12/2023    2:53 PM 05/02/2023    9:50 AM 01/28/2023   10:37 AM 10/29/2022   10:19 AM 07/29/2022   12:02 PM 05/10/2022    3:06 PM 12/10/2021    1:36 PM  PHQ 2/9 Scores  PHQ - 2 Score 0 0 0 0 0 2 0  PHQ- 9 Score 0 3 2 3 3 4 6     Fall Risk    05/12/2023    2:51 PM 05/02/2023    9:49 AM 01/28/2023   10:37 AM 10/29/2022   10:19 AM 07/29/2022   12:02 PM  Fall Risk   Falls in the past year? 0 0 0 0 0  Number falls in past yr: 0      Injury with Fall? 0      Risk for fall due to : No Fall Risks      Follow up Falls prevention discussed        MEDICARE RISK AT HOME: Medicare Risk at Home Any stairs in or around the home?: Yes If so, are there any without handrails?: No Home free of loose throw rugs in walkways, pet beds, electrical cords, etc?: Yes Adequate lighting in your home to reduce risk of falls?: Yes Life alert?: No Use of a cane, walker or w/c?: Yes Grab bars in the bathroom?: Yes Shower chair or bench in shower?: Yes Elevated toilet seat or a handicapped toilet?: Yes  TIMED UP AND GO:  Was the test performed?  No    Cognitive Function:        05/12/2023    2:55 PM 05/10/2022    2:53 PM 05/07/2021    2:23 PM  6CIT Screen  What  Year? 0 points 0 points 0 points  What month? 0 points 0 points 0 points  What time? 0 points 0 points 0 points  Count back from 20 0 points 0 points 0 points  Months in reverse 0 points 4 points 2 points  Repeat phrase 0 points 0 points 0 points  Total Score 0 points 4 points 2 points    Immunizations Immunization History  Administered Date(s) Administered   Fluad Quad(high Dose 65+) 07/19/2019, 07/29/2022  Hep A / Hep B 10/30/2019, 11/27/2019, 04/29/2020   Influenza,inj,Quad PF,6+ Mos 07/01/2018   Influenza-Unspecified 06/13/2018, 07/18/2020, 06/18/2021   Moderna Sars-Covid-2 Vaccination 12/21/2019, 01/21/2020, 08/30/2020   PNEUMOCOCCAL CONJUGATE-20 08/11/2021   Pneumococcal Conjugate-13 05/02/2020   Tdap 04/11/2019   Zoster Recombinant(Shingrix) 05/29/2019, 08/21/2019    TDAP status: Up to date  Flu Vaccine status: Up to date  Pneumococcal vaccine status: Up to date  Covid-19 vaccine status: Completed vaccines  Qualifies for Shingles Vaccine? Yes   Zostavax completed No   Shingrix Completed?: No.    Education has been provided regarding the importance of this vaccine. Patient has been advised to call insurance company to determine out of pocket expense if they have not yet received this vaccine. Advised may also receive vaccine at local pharmacy or Health Dept. Verbalized acceptance and understanding.  Screening Tests Health Maintenance  Topic Date Due   COVID-19 Vaccine (4 - 2023-24 season) 08/15/2023 (Originally 05/14/2022)   Lung Cancer Screening  10/30/2023 (Originally 03/08/2005)   INFLUENZA VACCINE  12/12/2023 (Originally 04/14/2023)   Colonoscopy  05/01/2024 (Originally 01/15/2023)   FOOT EXAM  07/30/2023   HEMOGLOBIN A1C  11/02/2023   OPHTHALMOLOGY EXAM  12/01/2023   Diabetic kidney evaluation - Urine ACR  01/28/2024   Diabetic kidney evaluation - eGFR measurement  05/10/2024   Medicare Annual Wellness (AWV)  05/11/2024   DTaP/Tdap/Td (2 - Td or Tdap) 04/10/2029    Pneumonia Vaccine 34+ Years old  Completed   Hepatitis C Screening  Completed   Zoster Vaccines- Shingrix  Completed   HPV VACCINES  Aged Out    Health Maintenance  There are no preventive care reminders to display for this patient.   Colorectal cancer screening: Type of screening: Colonoscopy. Completed 01/15/2023. Repeat every 1 years  Lung Cancer Screening: (Low Dose CT Chest recommended if Age 40-80 years, 20 pack-year currently smoking OR have quit w/in 15years.) does qualify.   Lung Cancer Screening Referral: declined   Additional Screening:  Hepatitis C Screening: does not qualify; Completed 01/29/2022  Vision Screening: Recommended annual ophthalmology exams for early detection of glaucoma and other disorders of the eye. Is the patient up to date with their annual eye exam?  Yes  Who is the provider or what is the name of the office in which the patient attends annual eye exams? Dr.Le  If pt is not established with a provider, would they like to be referred to a provider to establish care? No .   Dental Screening: Recommended annual dental exams for proper oral hygiene   Community Resource Referral / Chronic Care Management: CRR required this visit?  No   CCM required this visit?  No     Plan:     I have personally reviewed and noted the following in the patient's chart:   Medical and social history Use of alcohol, tobacco or illicit drugs  Current medications and supplements including opioid prescriptions. Patient is not currently taking opioid prescriptions. Functional ability and status Nutritional status Physical activity Advanced directives List of other physicians Hospitalizations, surgeries, and ER visits in previous 12 months Vitals Screenings to include cognitive, depression, and falls Referrals and appointments  In addition, I have reviewed and discussed with patient certain preventive protocols, quality metrics, and best practice  recommendations. A written personalized care plan for preventive services as well as general preventive health recommendations were provided to patient.     Lorrene Reid, LPN   [id number]   After Visit Summary: (MyChart) Due to  this being a telephonic visit, the after visit summary with patients personalized plan was offered to patient via MyChart   Nurse Notes: none

## 2023-05-22 ENCOUNTER — Other Ambulatory Visit: Payer: Self-pay | Admitting: Family Medicine

## 2023-05-22 DIAGNOSIS — E559 Vitamin D deficiency, unspecified: Secondary | ICD-10-CM

## 2023-05-24 ENCOUNTER — Ambulatory Visit: Payer: Medicare Other

## 2023-05-24 ENCOUNTER — Ambulatory Visit (HOSPITAL_COMMUNITY): Payer: Medicare Other | Attending: Cardiology

## 2023-05-24 DIAGNOSIS — Z01810 Encounter for preprocedural cardiovascular examination: Secondary | ICD-10-CM | POA: Insufficient documentation

## 2023-05-24 DIAGNOSIS — N1831 Chronic kidney disease, stage 3a: Secondary | ICD-10-CM | POA: Diagnosis present

## 2023-05-24 DIAGNOSIS — I10 Essential (primary) hypertension: Secondary | ICD-10-CM | POA: Diagnosis present

## 2023-05-24 DIAGNOSIS — I2510 Atherosclerotic heart disease of native coronary artery without angina pectoris: Secondary | ICD-10-CM | POA: Insufficient documentation

## 2023-05-24 DIAGNOSIS — E785 Hyperlipidemia, unspecified: Secondary | ICD-10-CM | POA: Diagnosis present

## 2023-05-24 LAB — ECHOCARDIOGRAM COMPLETE
Area-P 1/2: 3.85 cm2
S' Lateral: 3.2 cm

## 2023-05-27 NOTE — Progress Notes (Signed)
Spoke to pt and instructed them to come at 0845 and to be NPO after 0000. Confirmed no missed doses of AC and instructed to take in AM with a small sip of water.   Confirmed that pt will have a ride home and someone to stay with them for 24 hours after the procedure.

## 2023-05-30 ENCOUNTER — Ambulatory Visit (HOSPITAL_COMMUNITY): Payer: Medicare Other | Admitting: Anesthesiology

## 2023-05-30 ENCOUNTER — Ambulatory Visit (HOSPITAL_BASED_OUTPATIENT_CLINIC_OR_DEPARTMENT_OTHER)
Admission: RE | Admit: 2023-05-30 | Discharge: 2023-05-30 | Disposition: A | Discharge status: Home / Self Care | Payer: Medicare Other | Source: Home / Self Care | Attending: Internal Medicine | Admitting: Cardiovascular Disease

## 2023-05-30 ENCOUNTER — Other Ambulatory Visit: Payer: Self-pay

## 2023-05-30 ENCOUNTER — Ambulatory Visit (HOSPITAL_COMMUNITY)
Admission: RE | Admit: 2023-05-30 | Discharge: 2023-05-30 | Disposition: A | Discharge status: Home / Self Care | Payer: Medicare Other | Attending: Cardiovascular Disease | Admitting: Cardiovascular Disease

## 2023-05-30 ENCOUNTER — Encounter (HOSPITAL_COMMUNITY)
Admission: RE | Disposition: A | Discharge status: Home / Self Care | Payer: Self-pay | Source: Home / Self Care | Attending: Cardiovascular Disease

## 2023-05-30 DIAGNOSIS — Z79899 Other long term (current) drug therapy: Secondary | ICD-10-CM | POA: Diagnosis not present

## 2023-05-30 DIAGNOSIS — I4891 Unspecified atrial fibrillation: Secondary | ICD-10-CM

## 2023-05-30 DIAGNOSIS — E785 Hyperlipidemia, unspecified: Secondary | ICD-10-CM | POA: Insufficient documentation

## 2023-05-30 DIAGNOSIS — Z955 Presence of coronary angioplasty implant and graft: Secondary | ICD-10-CM | POA: Diagnosis not present

## 2023-05-30 DIAGNOSIS — G4733 Obstructive sleep apnea (adult) (pediatric): Secondary | ICD-10-CM | POA: Diagnosis not present

## 2023-05-30 DIAGNOSIS — I129 Hypertensive chronic kidney disease with stage 1 through stage 4 chronic kidney disease, or unspecified chronic kidney disease: Secondary | ICD-10-CM | POA: Diagnosis not present

## 2023-05-30 DIAGNOSIS — G4730 Sleep apnea, unspecified: Secondary | ICD-10-CM

## 2023-05-30 DIAGNOSIS — I25119 Atherosclerotic heart disease of native coronary artery with unspecified angina pectoris: Secondary | ICD-10-CM | POA: Insufficient documentation

## 2023-05-30 DIAGNOSIS — I2510 Atherosclerotic heart disease of native coronary artery without angina pectoris: Secondary | ICD-10-CM

## 2023-05-30 DIAGNOSIS — N189 Chronic kidney disease, unspecified: Secondary | ICD-10-CM | POA: Insufficient documentation

## 2023-05-30 DIAGNOSIS — E1122 Type 2 diabetes mellitus with diabetic chronic kidney disease: Secondary | ICD-10-CM | POA: Diagnosis not present

## 2023-05-30 DIAGNOSIS — I252 Old myocardial infarction: Secondary | ICD-10-CM | POA: Diagnosis not present

## 2023-05-30 HISTORY — PX: CARDIOVERSION: SHX1299

## 2023-05-30 LAB — GLUCOSE, CAPILLARY: Glucose-Capillary: 152 mg/dL — ABNORMAL HIGH (ref 70–99)

## 2023-05-30 SURGERY — CARDIOVERSION
Anesthesia: General

## 2023-05-30 MED ORDER — SODIUM CHLORIDE 0.9 % IV SOLN: INTRAVENOUS | Status: DC

## 2023-05-30 MED ORDER — PROPOFOL 10 MG/ML IV BOLUS
INTRAVENOUS | Status: DC | PRN
Start: 2023-05-30 — End: 2023-05-30
  Administered 2023-05-30: 70 mg via INTRAVENOUS

## 2023-05-30 MED ORDER — LIDOCAINE 2% (20 MG/ML) 5 ML SYRINGE
INTRAMUSCULAR | Status: DC | PRN
  Administered 2023-05-30: 60 mg via INTRAVENOUS

## 2023-05-30 SURGICAL SUPPLY — 1 items: ELECT DEFIB PAD ADLT CADENCE (PAD) ×1 IMPLANT

## 2023-05-30 NOTE — Interval H&P Note (Signed)
History and Physical Interval Note:  05/30/2023 9:12 AM  Kristen Loader  has presented today for surgery, with the diagnosis of afib.  The various methods of treatment have been discussed with the patient and family. After consideration of risks, benefits and other options for treatment, the patient has consented to  Procedure(s): CARDIOVERSION (N/A) as a surgical intervention.  The patient's history has been reviewed, patient examined, no change in status, stable for surgery.  I have reviewed the patient's chart and labs.  Questions were answered to the patient's satisfaction.     Charlton Haws

## 2023-05-30 NOTE — Transfer of Care (Signed)
Immediate Anesthesia Transfer of Care Note  Patient: [name] Foster  Procedure(s) Performed: CARDIOVERSION  Patient Location: Cath Lab  Anesthesia Type:General  Level of Consciousness: awake, alert , oriented, patient cooperative, and responds to stimulation  Airway & Oxygen Therapy: Patient Spontanous Breathing and Patient connected to nasal cannula oxygen  Post-op Assessment: Report given to RN and Post -op Vital signs reviewed and stable  Post vital signs: Reviewed and stable  Last Vitals:  Vitals Value Taken Time  BP 147/83 05/30/23 1004    Temp    Pulse 78 05/30/23 1004  Resp 20 05/30/23 1004    SpO2 98 % 05/30/23 1004    Vitals shown include unfiled device data.  Last Pain:  Vitals:   05/30/23 0915  TempSrc: Skin         Complications: No notable events documented.

## 2023-05-30 NOTE — Interval H&P Note (Signed)
History and Physical Interval Note:  05/30/2023 9:33 AM  [name] Foster  has presented today for surgery, with the diagnosis of afib.  The various methods of treatment have been discussed with the patient and family. After consideration of risks, benefits and other options for treatment, the patient has consented to  Procedure(s): CARDIOVERSION (N/A) as a surgical intervention.  The patient's history has been reviewed, patient examined, no change in status, stable for surgery.  I have reviewed the patient's chart and labs.  Questions were answered to the patient's satisfaction.     Charlton Haws

## 2023-05-30 NOTE — Anesthesia Preprocedure Evaluation (Signed)
Anesthesia Evaluation    Airway Mallampati: III  TM Distance: <3 FB Neck ROM: Full    Dental  (+) Edentulous Upper, Edentulous Lower   Pulmonary sleep apnea , former smoker   Pulmonary exam normal breath sounds clear to auscultation       Cardiovascular hypertension, + CAD, + Past MI and + Cardiac Stents  Normal cardiovascular exam+ dysrhythmias Atrial Fibrillation  Rhythm:Regular Rate:Normal  . Left ventricular ejection fraction, by estimation, is 55 to 60%. The  left ventricle has normal function. The left ventricle has no regional  wall motion abnormalities. There is mild left ventricular hypertrophy.  Left ventricular diastolic parameters  are indeterminate.   2. Right ventricular systolic function is normal. The right ventricular  size is normal.   3. The mitral valve is normal in structure. Trivial mitral valve  regurgitation.   4. The aortic valve was not well visualized. Aortic valve regurgitation  is trivial. No aortic stenosis is present.   5. The inferior vena cava is normal in size with greater than 50%  respiratory variability, suggesting right atrial pressure of 3 mmHg     Neuro/Psych    GI/Hepatic   Endo/Other  diabetes, Type 2    Renal/GU      Musculoskeletal   Abdominal   Peds  Hematology   Anesthesia Other Findings   Reproductive/Obstetrics                             Anesthesia Physical Anesthesia Plan  ASA: 3  Anesthesia Plan: General   Post-op Pain Management: Minimal or no pain anticipated   Induction: Intravenous  PONV Risk Score and Plan: Treatment may vary due to age or medical condition  Airway Management Planned: Nasal Cannula  Additional Equipment:   Intra-op Plan:   Post-operative Plan:   Informed Consent: I have reviewed the patients History and Physical, chart, labs and discussed the procedure including the risks, benefits and alternatives for  the proposed anesthesia with the patient or authorized representative who has indicated his/her understanding and acceptance.     Dental advisory given  Plan Discussed with: CRNA and Surgeon  Anesthesia Plan Comments:        Anesthesia Quick Evaluation

## 2023-05-30 NOTE — CV Procedure (Signed)
DCC: On Rx eliquis no missed doses Anesthesia: Propofol  DCC x 1 150 J Converted from afib rate 94 to NSR rate 68 bpm  No immediate neurologic sequelae  Charlton Haws MD [facility]

## 2023-05-30 NOTE — Anesthesia Postprocedure Evaluation (Signed)
Anesthesia Post Note  Patient: [name] Foster  Procedure(s) Performed: CARDIOVERSION     Patient location during evaluation: PACU Anesthesia Type: General Level of consciousness: awake and alert Pain management: pain level controlled Vital Signs Assessment: post-procedure vital signs reviewed and stable Respiratory status: spontaneous breathing, nonlabored ventilation, respiratory function stable and patient connected to nasal cannula oxygen Cardiovascular status: blood pressure returned to baseline and stable Postop Assessment: no apparent nausea or vomiting Anesthetic complications: no  No notable events documented.  Last Vitals:  Vitals:   05/30/23 1006 05/30/23 1007  BP: (!) 136/98 (!) 136/115  Pulse: 74 74  Resp: 16 16  Temp: 36.6 C   SpO2: 98% 98%    Last Pain:  Vitals:   05/30/23 1007  TempSrc:   PainSc: 0-No pain                 Khizar Fiorella S

## 2023-05-31 ENCOUNTER — Encounter (HOSPITAL_COMMUNITY): Payer: Self-pay | Admitting: Cardiovascular Disease

## 2023-06-01 ENCOUNTER — Ambulatory Visit (HOSPITAL_COMMUNITY)
Admission: RE | Admit: 2023-06-01 | Discharge: 2023-06-01 | Disposition: A | Discharge status: Home / Self Care | Payer: Medicare Other | Source: Ambulatory Visit | Attending: Internal Medicine | Admitting: Internal Medicine

## 2023-06-01 VITALS — BP 182/84 | HR 75 | Ht 70.0 in | Wt 193.0 lb

## 2023-06-01 DIAGNOSIS — Z7901 Long term (current) use of anticoagulants: Secondary | ICD-10-CM | POA: Diagnosis not present

## 2023-06-01 DIAGNOSIS — N189 Chronic kidney disease, unspecified: Secondary | ICD-10-CM | POA: Insufficient documentation

## 2023-06-01 DIAGNOSIS — I129 Hypertensive chronic kidney disease with stage 1 through stage 4 chronic kidney disease, or unspecified chronic kidney disease: Secondary | ICD-10-CM | POA: Diagnosis not present

## 2023-06-01 DIAGNOSIS — I4891 Unspecified atrial fibrillation: Secondary | ICD-10-CM

## 2023-06-01 DIAGNOSIS — I4819 Other persistent atrial fibrillation: Secondary | ICD-10-CM | POA: Diagnosis present

## 2023-06-01 DIAGNOSIS — E1122 Type 2 diabetes mellitus with diabetic chronic kidney disease: Secondary | ICD-10-CM | POA: Insufficient documentation

## 2023-06-01 DIAGNOSIS — D6869 Other thrombophilia: Secondary | ICD-10-CM

## 2023-06-01 NOTE — Patient Instructions (Signed)
Kardia Mobile or apple watch

## 2023-06-01 NOTE — Progress Notes (Signed)
Primary Care Physician: Gabriel Earing, FNP Primary Cardiologist: Tonny Bollman, MD Electrophysiologist: None     Referring Physician: Dr. Madolyn Frieze [name] Foster is a 67 y.o. male with a history of CAD s/p stent, T2DM, HTN, HLD, chronic kidney disease, OSA intolerant of CPAP, and persistent atrial fibrillation who presents for consultation in the [facility]. Seen by Dr. Excell Seltzer on 8/22 and scheduled for DCCV after 3 weeks of anticoagulation on Eliquis. He underwent successful DCCV on 05/30/23. Patient is on Eliquis for a CHADS2VASC score of 4.  On evaluation today, he is currently in NSR. He feels much better since cardioversion. He definitely could feel when he was in Afib and described it as a bird fluttering in his chest with feeling tired and SOB. He is currently taking Eliquis 5 mg BID and does not have bleeding issues.   Today, he denies symptoms of chest pain, orthopnea, PND, lower extremity edema, dizziness, presyncope, syncope, snoring, daytime somnolence, bleeding, or neurologic sequela. The patient is tolerating medications without difficulties and is otherwise without complaint today.    Atrial Fibrillation Risk Factors:  he does have symptoms or diagnosis of sleep apnea. he is not compliant with CPAP therapy.  he has a BMI of Body mass index is 27.69 kg/m.Marland Kitchen Filed Weights   06/01/23 1505  Weight: 87.5 kg    Current Outpatient Medications  Medication Sig Dispense Refill   albuterol (VENTOLIN HFA) 108 (90 Base) MCG/ACT inhaler TAKE 2 PUFFS BY MOUTH EVERY 6 HOURS AS NEEDED FOR WHEEZE OR SHORTNESS OF BREATH 8.5 each 2   amLODipine (NORVASC) 5 MG tablet Take 1 tablet (5 mg total) by mouth daily. 90 tablet 3   apixaban (ELIQUIS) 5 MG TABS tablet Take 1 tablet (5 mg total) by mouth 2 (two) times daily. 180 tablet 3   atorvastatin (LIPITOR) 40 MG tablet Take 1 tablet (40 mg total) by mouth daily at 6 PM. 90 tablet 3   Cholecalciferol  (VITAMIN D) 50 MCG (2000 UT) tablet Take 2,000 Units by mouth daily.     clopidogrel (PLAVIX) 75 MG tablet Take 1 tablet (75 mg total) by mouth daily. 90 tablet 3   Coenzyme Q10 (COQ-10) 100 MG CAPS Take 100 mg by mouth daily.     dapagliflozin propanediol (FARXIGA) 10 MG TABS tablet Take 1 tablet (10 mg total) by mouth daily before breakfast. 90 tablet 4   DULoxetine (CYMBALTA) 30 MG capsule Take 1 capsule (30 mg total) by mouth daily. 90 capsule 3   ezetimibe (ZETIA) 10 MG tablet TAKE 1 TABLET BY MOUTH EVERY DAY 90 tablet 3   FOLIC ACID-B6-B12-INTRINS FACT PO Take 1 tablet by mouth daily.     hydrALAZINE (APRESOLINE) 50 MG tablet Take 1 tablet (50 mg total) by mouth every 8 (eight) hours. (Patient taking differently: Take 50 mg by mouth in the morning and at bedtime.) 270 tablet 3   hydrochlorothiazide (HYDRODIURIL) 12.5 MG tablet Take 1 tablet (12.5 mg total) by mouth daily. (NEEDS TO BE SEEN BEFORE NEXT REFILL) (Patient taking differently: Take 12.5 mg by mouth daily as needed (Swelling in ankles). (NEEDS TO BE SEEN BEFORE NEXT REFILL)) 90 tablet 0   isosorbide mononitrate (IMDUR) 30 MG 24 hr tablet TAKE 1/2 OF A TABLET (15 MG TOTAL) BY MOUTH DAILY 45 tablet 3   metoprolol tartrate (LOPRESSOR) 50 MG tablet Take 1 tablet (50 mg total) by mouth 2 (two) times daily. 180 tablet 3  mirtazapine (REMERON) 15 MG tablet Take 15 mg by mouth at bedtime.     nitroGLYCERIN (NITROSTAT) 0.4 MG SL tablet DISSOLVE 1 TAB UNDER TONGUE AT ONSET OF CHEST PAIN. REPEAT EVERY 5 MINUTES FOR 2 DOSES AS NEEDED 25 tablet 2   RESTASIS 0.05 % ophthalmic emulsion Place 1 drop into both eyes daily.     tamsulosin (FLOMAX) 0.4 MG CAPS capsule Take 1 capsule (0.4 mg total) by mouth daily. 90 capsule 3   No current facility-administered medications for this encounter.    Atrial Fibrillation Management history:  Previous antiarrhythmic drugs: none Previous cardioversions: 05/30/23 Previous ablations: none Anticoagulation  history: Eliquis   ROS- All systems are reviewed and negative except as per the HPI above.  Physical Exam: BP (!) 182/84   Pulse 75   Ht 5\' 10"  (1.778 m)   Wt 87.5 kg   BMI 27.69 kg/m   GEN: Well nourished, well developed in no acute distress NECK: No JVD; No carotid bruits CARDIAC: Regular rate and rhythm, no murmurs, rubs, gallops RESPIRATORY:  Clear to auscultation without rales, wheezing or rhonchi  ABDOMEN: Soft, non-tender, non-distended EXTREMITIES:  No edema; No deformity   EKG today demonstrates  Vent. rate 75 BPM PR interval 144 ms QRS duration 108 ms QT/QTcB 404/451 ms P-R-T axes -12 -23 -32 Sinus rhythm with Premature supraventricular complexes Otherwise normal ECG When compared with ECG of 30-May-2023 10:07, PREVIOUS ECG IS PRESENT  Echo 05/24/23 demonstrated   1. Left ventricular ejection fraction, by estimation, is 55 to 60%. The  left ventricle has normal function. The left ventricle has no regional  wall motion abnormalities. There is mild left ventricular hypertrophy.  Left ventricular diastolic parameters  are indeterminate.   2. Right ventricular systolic function is normal. The right ventricular  size is normal.   3. The mitral valve is normal in structure. Trivial mitral valve  regurgitation.   4. The aortic valve was not well visualized. Aortic valve regurgitation  is trivial. No aortic stenosis is present.   5. The inferior vena cava is normal in size with greater than 50%  respiratory variability, suggesting right atrial pressure of 3 mmHg.    ASSESSMENT & PLAN CHA2DS2-VASc Score = 4  The patient's score is based upon: CHF History: 0 HTN History: 1 Diabetes History: 1 Stroke History: 0 Vascular Disease History: 1 Age Score: 1 Gender Score: 0       ASSESSMENT AND PLAN: Persistent Atrial Fibrillation (ICD10:  I48.19) The patient's CHA2DS2-VASc score is 4, indicating a 4.8% annual risk of stroke.    He is currently in NSR. Education  provided about Afib. Discussion about medication treatments and ablation going forward if indicated. He could be a candidate for Multaq, Tikosyn, or amiodarone as a bridge to ablation. If he chose Tikosyn, could not take hydrochlorothiazide and would have to watch potential interaction with Remeron. CrCl estimate 70 mL/min. After discussion, we will proceed with conservative observation at this time. Rhythm monitoring device recommended.    Secondary Hypercoagulable State (ICD10:  D68.69) The patient is at significant risk for stroke/thromboembolism based upon his CHA2DS2-VASc Score of 4.  Continue Apixaban (Eliquis).  No missed doses. Repeat CBC in 1 month.   Hypertension Patient tells me he has not taken his BP meds yet today. He has a BP cuff at home and I advised patient to please take medications as soon as he gets home. I recommended he take a BP diary and to let us know if despite medications  his BP is staying elevated to adjust accordingly.   Follow up 3 months Afib clinic.    Lake Bells, PA-C  Afib Clinic [facility] [address] [place], [place] [address] [phone]

## 2023-06-03 ENCOUNTER — Telehealth: Payer: Self-pay | Admitting: Family Medicine

## 2023-06-03 NOTE — Telephone Encounter (Signed)
Pt needs 1 month supply of his Eliquis sent in, instead of 3 mth supply because he can't afford the 3 mth supply.

## 2023-06-03 NOTE — Telephone Encounter (Signed)
Informed pt that the pharmacy can split a 90-d supply down to a 30-d supply to just let them know about this.

## 2023-06-08 NOTE — Progress Notes (Signed)
06/09/2023 Name: DRUMMOND HANKES MRN: [id number] DOB: 1956-02-19  No chief complaint on file.   [name] Foster is a 67 y.o. year old male who was referred for medication management by their primary care provider, Gabriel Earing, FNP. They presented for a face to face visit today.   They were referred to the pharmacist by their PCP for assistance in managing diabetes. He is enrolled in [place]&me PAP for [place]. He was recently discharged from elective cardioversion for new onset Atrial fibrillation. This procedure took place on 05/30/2023.  Subjective:  Care Team: Primary Care Provider: Gabriel Earing, FNP ; Next Scheduled Visit: 08/03/2023 [facility]: Ermelinda Das; 08/31/2023  Medication Access/Adherence  Current Pharmacy:  [id number] - [place] - [address] [place] [address] Phone: [phone] Fax: [phone]  [id number] - [place], [place] - [address] [place] [address] Phone: [phone] Fax: [phone]  [id number] [place], [place] - [address] [place] [address] Phone: [phone] Fax: [phone]  [facility] - [place], [place] - [address] [place] [address] Phone: [phone] Fax: [phone]   Patient reports affordability concerns with their medications: Yes  Patient reports access/transportation concerns to their pharmacy: No  Patient reports adherence concerns with their medications:  No     Diabetes:  Current medications:  Farxiga 10 mg daily with breakfast  Medications tried in the past:  Jardiance 25 mg daily before breakfast Metformin 1000 mg each evening   Current glucose readings: <130   Patient denies hypoglycemic s/sx including dizziness, shakiness, sweating. Patient denies hyperglycemic symptoms  including polyuria, polydipsia, polyphagia, nocturia, neuropathy, blurred vision.  Current physical activity: encouraged as able  Current medication access support: AZ&me patient assistance program   Objective:  Lab Results  Component Value Date   HGBA1C 6.3 (H) 05/02/2023    Lab Results  Component Value Date   CREATININE 1.23 05/11/2023   BUN 14 05/11/2023   NA 140 05/11/2023   K 4.2 05/11/2023   CL 101 05/11/2023   CO2 20 05/11/2023    Lab Results  Component Value Date   CHOL 119 03/23/2023   HDL 51 03/23/2023   LDLCALC 47 03/23/2023   TRIG 117 03/23/2023   CHOLHDL 2.3 03/23/2023    Medications Reviewed Today   Medications were not reviewed in this encounter       Assessment/Plan:   Diabetes: - Currently controlled. A1c 6.3 (<7).  - Reviewed long term cardiovascular and renal outcomes of uncontrolled blood sugar - Reviewed goal A1c, goal fasting, and goal 2 hour post prandial glucose - Reviewed dietary modifications including FOLLOWING A HEART HEALTHY DIET/HEALTHY PLATE METHOD - Reviewed lifestyle modifications including:increase physical activity as able - Recommend to continue farxiga  - Recommend to check glucose daily (fasting) or if symptomatic - Meets financial criteria for AZ&me patient assistance program through 09/13/2023. Will collaborate with provider, CPhT, and patient to pursue assistance for 2025.     Follow Up Plan: 4-6 weeks  Kieth Brightly, PharmD, BCACP Clinical Pharmacist, [facility]

## 2023-06-09 ENCOUNTER — Telehealth: Payer: Self-pay | Admitting: Pharmacist

## 2023-06-09 ENCOUNTER — Ambulatory Visit (INDEPENDENT_AMBULATORY_CARE_PROVIDER_SITE_OTHER): Payer: Medicare Other | Admitting: Pharmacist

## 2023-06-09 DIAGNOSIS — E119 Type 2 diabetes mellitus without complications: Secondary | ICD-10-CM | POA: Diagnosis not present

## 2023-06-09 DIAGNOSIS — E1165 Type 2 diabetes mellitus with hyperglycemia: Secondary | ICD-10-CM

## 2023-06-09 DIAGNOSIS — Z7984 Long term (current) use of oral hypoglycemic drugs: Secondary | ICD-10-CM

## 2023-06-09 MED ORDER — DAPAGLIFLOZIN PROPANEDIOL 10 MG PO TABS: 10.0000 mg | ORAL_TABLET | Freq: Every day | ORAL | 4 refills | Status: DC

## 2023-06-09 NOTE — Telephone Encounter (Signed)
Please send me full PAP app for Farxiga 10mg  escribed to [facility]

## 2023-06-10 ENCOUNTER — Emergency Department (HOSPITAL_COMMUNITY): Payer: Medicare Other

## 2023-06-10 ENCOUNTER — Emergency Department (HOSPITAL_COMMUNITY)
Admission: EM | Admit: 2023-06-10 | Discharge: 2023-06-10 | Disposition: A | Discharge status: Home / Self Care | Payer: Medicare Other | Attending: Emergency Medicine | Admitting: Emergency Medicine

## 2023-06-10 ENCOUNTER — Other Ambulatory Visit: Payer: Self-pay

## 2023-06-10 DIAGNOSIS — R0602 Shortness of breath: Secondary | ICD-10-CM | POA: Insufficient documentation

## 2023-06-10 DIAGNOSIS — Z7901 Long term (current) use of anticoagulants: Secondary | ICD-10-CM | POA: Diagnosis not present

## 2023-06-10 DIAGNOSIS — R0789 Other chest pain: Secondary | ICD-10-CM | POA: Insufficient documentation

## 2023-06-10 DIAGNOSIS — R002 Palpitations: Secondary | ICD-10-CM | POA: Diagnosis not present

## 2023-06-10 DIAGNOSIS — I480 Paroxysmal atrial fibrillation: Secondary | ICD-10-CM

## 2023-06-10 DIAGNOSIS — R42 Dizziness and giddiness: Secondary | ICD-10-CM | POA: Insufficient documentation

## 2023-06-10 LAB — CBC
HCT: 38.3 % — ABNORMAL LOW (ref 39.0–52.0)
Hemoglobin: 12.5 g/dL — ABNORMAL LOW (ref 13.0–17.0)
MCH: 29.9 pg (ref 26.0–34.0)
MCHC: 32.6 g/dL (ref 30.0–36.0)
MCV: 91.6 fL (ref 80.0–100.0)
Platelets: 138 10*3/uL — ABNORMAL LOW (ref 150–400)
RBC: 4.18 MIL/uL — ABNORMAL LOW (ref 4.22–5.81)
RDW: 15.1 % (ref 11.5–15.5)
WBC: 6.1 10*3/uL (ref 4.0–10.5)
nRBC: 0 % (ref 0.0–0.2)

## 2023-06-10 LAB — BASIC METABOLIC PANEL
Anion gap: 17 — ABNORMAL HIGH (ref 5–15)
BUN: 19 mg/dL (ref 8–23)
CO2: 20 mmol/L — ABNORMAL LOW (ref 22–32)
Calcium: 8 mg/dL — ABNORMAL LOW (ref 8.9–10.3)
Chloride: 104 mmol/L (ref 98–111)
Creatinine, Ser: 1.37 mg/dL — ABNORMAL HIGH (ref 0.61–1.24)
GFR, Estimated: 56 mL/min — ABNORMAL LOW (ref >60)
Glucose, Bld: 100 mg/dL — ABNORMAL HIGH (ref 70–99)
Potassium: 3.7 mmol/L (ref 3.5–5.1)
Sodium: 141 mmol/L (ref 135–145)

## 2023-06-10 LAB — MAGNESIUM: Magnesium: 1.7 mg/dL (ref 1.7–2.4)

## 2023-06-10 MED ORDER — METOPROLOL TARTRATE 50 MG PO TABS
50.0000 mg | ORAL_TABLET | Freq: Once | ORAL | Status: AC
  Administered 2023-06-10: 50 mg via ORAL
  Filled 2023-06-10: qty 1

## 2023-06-10 MED ORDER — ALBUTEROL SULFATE HFA 108 (90 BASE) MCG/ACT IN AERS: 2.0000 | INHALATION_SPRAY | RESPIRATORY_TRACT | Status: DC | PRN

## 2023-06-10 MED ORDER — SODIUM CHLORIDE 0.9 % IV BOLUS
1000.0000 mL | Freq: Once | INTRAVENOUS | Status: AC
  Administered 2023-06-10: 1000 mL via INTRAVENOUS

## 2023-06-10 NOTE — ED Provider Notes (Addendum)
Patient heart rate now pretty much in the low 100s.  Patient's feeling much better.  No real symptoms.  Chest x-ray reviewed by me nothing acute there.  CBC normal white count hemoglobin 12.5 basic metabolic panel potassium is normal.  GFR 56 glucose 100 magnesium normal.  Patient normally takes 50 mg of Lopressor twice a day.  With his heart rate being fairly low we will go ahead and give him the Lopressor.  And then observe.   Vanetta Mulders, MD 06/10/23 1611  Patient responded well to the 50 mg of Lopressor.  Patient is now in sinus rhythm.  Heart rate in the 70s.  So atrial fibrillation initially was fairly well-controlled with the Lopressor.  Heart rate did come down to the 90s even when he was still in some A-fib.  Patient stable for discharge home we will have him follow-up with cardiology have him call on Monday.  Will return for any new or worse symptoms.    Vanetta Mulders, MD 06/10/23 1850

## 2023-06-10 NOTE — ED Triage Notes (Signed)
Pt arrived [facility] with c/o SOB . Pt stated he had an ablation on Monday and has had no trouble. Pt woke up this morning around 9am . Pt began to stand up and started feeling dizzy and felt his heart flutter. No n/v noted. [facility] gave 324 ASA , 0.4 nitroglycerin for chest tightness on the truck. Pt is on RA but feels SOB so 2 lpm applied for comfort.

## 2023-06-10 NOTE — Discharge Instructions (Signed)
The blood pressure medication that you normally take twice a day has converted you back to sinus rhythm.  Give cardiology a call on Monday to let them know you had a bout of some atrial fibrillation.  Rest of workup negative.  Return for heart rate going fast for 40 minutes or longer for any new or worse symptoms.

## 2023-06-10 NOTE — ED Notes (Signed)
Pt mistaken about ablation, it was cardioversion.

## 2023-06-10 NOTE — ED Provider Notes (Signed)
[facility] EMERGENCY DEPARTMENT AT [facility] Provider Note   CSN: [id number] Arrival date & time: 06/10/23  1349     History  Chief Complaint  Patient presents with   Shortness of Breath    [name] Foster is a 67 y.o. male.  History of A-fib on Eliquis presenting emergency department with lightheadedness.  Reports been in normal state of health.  Recently had cardioversion this past Monday with cardiology.  States he has been feeling fine woke up this morning stood up had some mild chest tightness and lightheadedness.  Noted his heart rate was elevated presented the emergency department not having chest pain currently.  States he feels just overall weak.   Shortness of Breath      Home Medications Prior to Admission medications   Medication Sig Start Date End Date Taking? Authorizing Provider  albuterol (VENTOLIN HFA) 108 (90 Base) MCG/ACT inhaler TAKE 2 PUFFS BY MOUTH EVERY 6 HOURS AS NEEDED FOR WHEEZE OR SHORTNESS OF BREATH 12/23/21   Gwenlyn Fudge, FNP  amLODipine (NORVASC) 5 MG tablet Take 1 tablet (5 mg total) by mouth daily. 10/29/22   Gabriel Earing, FNP  apixaban (ELIQUIS) 5 MG TABS tablet Take 1 tablet (5 mg total) by mouth 2 (two) times daily. 05/02/23   Gabriel Earing, FNP  atorvastatin (LIPITOR) 40 MG tablet Take 1 tablet (40 mg total) by mouth daily at 6 PM. 05/02/23   Gabriel Earing, FNP  Cholecalciferol (VITAMIN D) 50 MCG (2000 UT) tablet Take 2,000 Units by mouth daily.    [provider]  clopidogrel (PLAVIX) 75 MG tablet Take 1 tablet (75 mg total) by mouth daily. 05/02/23   Gabriel Earing, FNP  Coenzyme Q10 (COQ-10) 100 MG CAPS Take 100 mg by mouth daily.    [provider]  dapagliflozin propanediol (FARXIGA) 10 MG TABS tablet Take 1 tablet (10 mg total) by mouth daily before breakfast. 06/09/23   Gabriel Earing, FNP  DULoxetine (CYMBALTA) 30 MG capsule Take 1 capsule (30 mg total) by mouth daily. 05/02/23   Gabriel Earing, FNP  ezetimibe (ZETIA) 10 MG tablet TAKE 1 TABLET BY MOUTH EVERY DAY 12/27/22   Tonny Bollman, MD  FOLIC ACID-B6-B12-INTRINS FACT PO Take 1 tablet by mouth daily.    [provider]  hydrALAZINE (APRESOLINE) 50 MG tablet Take 1 tablet (50 mg total) by mouth every 8 (eight) hours. Patient taking differently: Take 50 mg by mouth in the morning and at bedtime. 05/02/23   Gabriel Earing, FNP  hydrochlorothiazide (HYDRODIURIL) 12.5 MG tablet Take 1 tablet (12.5 mg total) by mouth daily. (NEEDS TO BE SEEN BEFORE NEXT REFILL) Patient taking differently: Take 12.5 mg by mouth daily as needed (Swelling in ankles). (NEEDS TO BE SEEN BEFORE NEXT REFILL) 11/13/21   Gwenlyn Fudge, FNP  isosorbide mononitrate (IMDUR) 30 MG 24 hr tablet TAKE 1/2 OF A TABLET (15 MG TOTAL) BY MOUTH DAILY 05/02/23   Gabriel Earing, FNP  metoprolol tartrate (LOPRESSOR) 50 MG tablet Take 1 tablet (50 mg total) by mouth 2 (two) times daily. 05/02/23   Gabriel Earing, FNP  mirtazapine (REMERON) 15 MG tablet Take 15 mg by mouth at bedtime. 05/02/23   [provider]  nitroGLYCERIN (NITROSTAT) 0.4 MG SL tablet DISSOLVE 1 TAB UNDER TONGUE AT ONSET OF CHEST PAIN. REPEAT EVERY 5 MINUTES FOR 2 DOSES AS NEEDED 03/05/20   Gwenlyn Fudge, FNP  RESTASIS 0.05 % ophthalmic emulsion Place  1 drop into both eyes daily. 04/21/20   [provider]  tamsulosin (FLOMAX) 0.4 MG CAPS capsule Take 1 capsule (0.4 mg total) by mouth daily. 05/02/23   Gabriel Earing, FNP      Allergies    Patient has no known allergies.    Review of Systems   Review of Systems  Respiratory:  Positive for shortness of breath.     Physical Exam Updated Vital Signs BP (!) 159/82   Pulse (!) 112   Temp 98.3 F (36.8 C) (Oral)   Resp 16   Ht 5\' 10"  (1.778 m)   Wt 85.3 kg   SpO2 97%   BMI 26.98 kg/m  Physical Exam Vitals and nursing note reviewed.  HENT:     Head: Normocephalic.  Cardiovascular:     Rate and Rhythm:  Tachycardia present. Rhythm irregular.  Pulmonary:     Effort: Pulmonary effort is normal.     Breath sounds: No wheezing, rhonchi or rales.  Musculoskeletal:     Right lower leg: No edema.     Left lower leg: No edema.  Skin:    General: Skin is warm.  Neurological:     General: No focal deficit present.     Mental Status: He is alert.  Psychiatric:        Mood and Affect: Mood normal.        Behavior: Behavior normal.     ED Results / Procedures / Treatments   Labs (all labs ordered are listed, but only abnormal results are displayed) Labs Reviewed  CBC  BASIC METABOLIC PANEL  MAGNESIUM    EKG EKG Interpretation Date/Time:  Friday June 10 2023 14:03:01 EDT Ventricular Rate:  130 PR Interval:    QRS Duration:  103 QT Interval:  331 QTC Calculation: 487 R Axis:   69  Text Interpretation: Atrial fibrillation Paired ventricular premature complexes Borderline repolarization abnormality Borderline prolonged QT interval Confirmed by Estanislado Pandy [phone]) on 06/10/2023 2:16:20 PM  Radiology No results found.  Procedures Procedures    Medications Ordered in ED Medications  albuterol (VENTOLIN HFA) 108 (90 Base) MCG/ACT inhaler 2 puff (has no administration in time range)  sodium chloride 0.9 % bolus 1,000 mL (1,000 mLs Intravenous New Bag/Given 06/10/23 1427)    ED Course/ Medical Decision Making/ A&P                                 Medical Decision Making 67 year old male present emergency department for palpitations, chest tightness.  Noted to be in A-fib RVR.  Hemodynamically stable.  Exam reassuring.  Clear lungs.  Per chart review recent cardioversion.  Takes Eliquis.  Does not appear to be on rate controlling medications.  Will get basic labs and reevaluate.  Care signed out to afternoon team.  Amount and/or Complexity of Data Reviewed Labs: ordered. Radiology: ordered.  Risk Prescription drug management.          Final Clinical  Impression(s) / ED Diagnoses Final diagnoses:  None    Rx / DC Orders ED Discharge Orders     None         Coral Spikes, DO 06/10/23 1543

## 2023-06-13 ENCOUNTER — Telehealth: Payer: Self-pay | Admitting: Cardiovascular Disease

## 2023-06-13 NOTE — Telephone Encounter (Signed)
I spoke with patient.  He was in ED recently and is back in afib.  Patient reports breathing is now OK.  He feels lightheaded at times.  Just purchased [place].  I offered appointment with afib clinic tomorrow but patient needs to arrange transportation and cannot come in until Wednesday.  Appointment made in afib clinic for October 2 at 10:00

## 2023-06-13 NOTE — Telephone Encounter (Signed)
Patient is calling back due to not yet hearing back about his symptoms.  Please advise.

## 2023-06-13 NOTE — Telephone Encounter (Signed)
Patient c/o Palpitations:  STAT if patient reporting lightheadedness, shortness of breath, or chest pain  How long have you had palpitations/irregular HR/ Afib? Are you having the symptoms now? Had Cardioversion on 06-02-23. Friday he was back in Afib, went to [facility] ER  Are you currently experiencing lightheadedness, SOB or CP? Still short of breath and lightheaded, when he stands up  Do you have a history of afib (atrial fibrillation) or irregular heart rhythm?   Have you checked your BP or HR? (document readings if available): blood pressure was running high, have not checked it today  Are you experiencing any other symptoms?

## 2023-06-15 ENCOUNTER — Ambulatory Visit (HOSPITAL_COMMUNITY)
Admission: RE | Admit: 2023-06-15 | Discharge: 2023-06-15 | Disposition: A | Discharge status: Home / Self Care | Payer: Medicare Other | Source: Ambulatory Visit | Attending: Internal Medicine | Admitting: Internal Medicine

## 2023-06-15 ENCOUNTER — Other Ambulatory Visit: Payer: Self-pay

## 2023-06-15 VITALS — BP 152/80 | HR 90 | Ht 70.0 in | Wt 188.6 lb

## 2023-06-15 DIAGNOSIS — Z7902 Long term (current) use of antithrombotics/antiplatelets: Secondary | ICD-10-CM | POA: Insufficient documentation

## 2023-06-15 DIAGNOSIS — Z955 Presence of coronary angioplasty implant and graft: Secondary | ICD-10-CM | POA: Insufficient documentation

## 2023-06-15 DIAGNOSIS — I4819 Other persistent atrial fibrillation: Secondary | ICD-10-CM | POA: Insufficient documentation

## 2023-06-15 DIAGNOSIS — Z7901 Long term (current) use of anticoagulants: Secondary | ICD-10-CM | POA: Diagnosis not present

## 2023-06-15 DIAGNOSIS — D6869 Other thrombophilia: Secondary | ICD-10-CM | POA: Insufficient documentation

## 2023-06-15 DIAGNOSIS — I2510 Atherosclerotic heart disease of native coronary artery without angina pectoris: Secondary | ICD-10-CM | POA: Insufficient documentation

## 2023-06-15 DIAGNOSIS — R9431 Abnormal electrocardiogram [ECG] [EKG]: Secondary | ICD-10-CM | POA: Diagnosis not present

## 2023-06-15 MED ORDER — AMIODARONE HCL 200 MG PO TABS: 200.0000 mg | ORAL_TABLET | Freq: Two times a day (BID) | ORAL | 2 refills | Status: DC

## 2023-06-15 NOTE — Patient Instructions (Signed)
Start Amiodarone 200 mg- Taking 1 tablet by mouth twice daily x 1 month

## 2023-06-15 NOTE — Addendum Note (Signed)
Encounter addended by: Eustace Pen, PA-C on: 06/15/2023 11:37 AM  Actions taken: Clinical Note Signed

## 2023-06-15 NOTE — Progress Notes (Addendum)
Primary Care Physician: Gabriel Earing, FNP Primary Cardiologist: Tonny Bollman, MD Electrophysiologist: None     Referring Physician: Dr. Madolyn Frieze [name] Foster is a 67 y.o. male with a history of CAD s/p stent, T2DM, HTN, HLD, chronic kidney disease, OSA intolerant of CPAP, and persistent atrial fibrillation who presents for consultation in the [facility]. Seen by Dr. Excell Seltzer on 8/22 and scheduled for DCCV after 3 weeks of anticoagulation on Eliquis. He underwent successful DCCV on 05/30/23. Patient is on Eliquis for a CHADS2VASC score of 4.  On evaluation today, he is currently in NSR. He feels much better since cardioversion. He definitely could feel when he was in Afib and described it as a bird fluttering in his chest with feeling tired and SOB. He is currently taking Eliquis 5 mg BID and does not have bleeding issues.   On follow up 10/2, he is currently in Afib. Seen in ED on 9/27 for Afib with RVR. He converted to NSR and was sent home on Lopressor 50 mg BID. Nosebleed on Monday that lasted intermittently for several hours. He feels tired and SOB when in Afib. He has not had any additional episodes of epistaxis prior or since then. No missed doses of anticoagulant.   Today, he denies symptoms of chest pain, orthopnea, PND, lower extremity edema, dizziness, presyncope, syncope, snoring, daytime somnolence, bleeding, or neurologic sequela. The patient is tolerating medications without difficulties and is otherwise without complaint today.    Atrial Fibrillation Risk Factors:  he does have symptoms or diagnosis of sleep apnea. he is not compliant with CPAP therapy.  he has a BMI of Body mass index is 27.06 kg/m.Marland Kitchen Filed Weights   06/15/23 1004  Weight: 85.5 kg     Current Outpatient Medications  Medication Sig Dispense Refill   albuterol (VENTOLIN HFA) 108 (90 Base) MCG/ACT inhaler TAKE 2 PUFFS BY MOUTH EVERY 6 HOURS AS NEEDED FOR WHEEZE  OR SHORTNESS OF BREATH 8.5 each 2   amiodarone (PACERONE) 200 MG tablet Take 1 tablet (200 mg total) by mouth in the morning and at bedtime. 60 tablet 2   amLODipine (NORVASC) 5 MG tablet Take 1 tablet (5 mg total) by mouth daily. 90 tablet 3   apixaban (ELIQUIS) 5 MG TABS tablet Take 1 tablet (5 mg total) by mouth 2 (two) times daily. 180 tablet 3   atorvastatin (LIPITOR) 40 MG tablet Take 1 tablet (40 mg total) by mouth daily at 6 PM. 90 tablet 3   Cholecalciferol (VITAMIN D) 50 MCG (2000 UT) tablet Take 2,000 Units by mouth daily.     clopidogrel (PLAVIX) 75 MG tablet Take 1 tablet (75 mg total) by mouth daily. 90 tablet 3   Coenzyme Q10 (COQ-10) 100 MG CAPS Take 100 mg by mouth daily.     dapagliflozin propanediol (FARXIGA) 10 MG TABS tablet Take 1 tablet (10 mg total) by mouth daily before breakfast. 90 tablet 4   DULoxetine (CYMBALTA) 30 MG capsule Take 1 capsule (30 mg total) by mouth daily. 90 capsule 3   ezetimibe (ZETIA) 10 MG tablet TAKE 1 TABLET BY MOUTH EVERY DAY 90 tablet 3   FOLIC ACID-B6-B12-INTRINS FACT PO Take 1 tablet by mouth daily.     hydrALAZINE (APRESOLINE) 50 MG tablet Take 1 tablet (50 mg total) by mouth every 8 (eight) hours. (Patient taking differently: Take 50 mg by mouth in the morning and at bedtime.) 270 tablet 3   hydrochlorothiazide (  HYDRODIURIL) 12.5 MG tablet Take 1 tablet (12.5 mg total) by mouth daily. (NEEDS TO BE SEEN BEFORE NEXT REFILL) (Patient taking differently: Take 12.5 mg by mouth daily as needed (Swelling in ankles). (NEEDS TO BE SEEN BEFORE NEXT REFILL)) 90 tablet 0   isosorbide mononitrate (IMDUR) 30 MG 24 hr tablet TAKE 1/2 OF A TABLET (15 MG TOTAL) BY MOUTH DAILY 45 tablet 3   metoprolol tartrate (LOPRESSOR) 50 MG tablet Take 1 tablet (50 mg total) by mouth 2 (two) times daily. 180 tablet 3   mirtazapine (REMERON) 15 MG tablet Take 15 mg by mouth at bedtime.     nitroGLYCERIN (NITROSTAT) 0.4 MG SL tablet DISSOLVE 1 TAB UNDER TONGUE AT ONSET OF  CHEST PAIN. REPEAT EVERY 5 MINUTES FOR 2 DOSES AS NEEDED 25 tablet 2   RESTASIS 0.05 % ophthalmic emulsion Place 1 drop into both eyes daily.     tamsulosin (FLOMAX) 0.4 MG CAPS capsule Take 1 capsule (0.4 mg total) by mouth daily. 90 capsule 3   No current facility-administered medications for this encounter.    Atrial Fibrillation Management history:  Previous antiarrhythmic drugs: none Previous cardioversions: 05/30/23 Previous ablations: none Anticoagulation history: Eliquis   ROS- All systems are reviewed and negative except as per the HPI above.  Physical Exam: BP (!) 152/80   Pulse 90   Ht 5\' 10"  (1.778 m)   Wt 85.5 kg   BMI 27.06 kg/m   GEN- The patient is well appearing, alert and oriented x 3 today.   Neck - no JVD or carotid bruit noted Lungs- Clear to ausculation bilaterally, normal work of breathing Heart- Irregular rate and rhythm, no murmurs, rubs or gallops, PMI not laterally displaced Extremities- no clubbing, cyanosis, or edema Skin - no rash or ecchymosis noted   EKG today demonstrates  Afib HR 90 QRS 106 ms QT/Qtc 402/491 ms  Echo 05/24/23 demonstrated   1. Left ventricular ejection fraction, by estimation, is 55 to 60%. The  left ventricle has normal function. The left ventricle has no regional  wall motion abnormalities. There is mild left ventricular hypertrophy.  Left ventricular diastolic parameters  are indeterminate.   2. Right ventricular systolic function is normal. The right ventricular  size is normal.   3. The mitral valve is normal in structure. Trivial mitral valve  regurgitation.   4. The aortic valve was not well visualized. Aortic valve regurgitation  is trivial. No aortic stenosis is present.   5. The inferior vena cava is normal in size with greater than 50%  respiratory variability, suggesting right atrial pressure of 3 mmHg.    ASSESSMENT & PLAN CHA2DS2-VASc Score = 4  The patient's score is based upon: CHF History:  0 HTN History: 1 Diabetes History: 1 Stroke History: 0 Vascular Disease History: 1 Age Score: 1 Gender Score: 0       ASSESSMENT AND PLAN: Persistent Atrial Fibrillation (ICD10:  I48.19) The patient's CHA2DS2-VASc score is 4, indicating a 4.8% annual risk of stroke.    He is currently in Afib. We discussed options for rhythm control at length. After discussion, patient would like to speak with EP about ablation. He would be interested in medication therapy to try and be in normal rhythm. After discussion of choices, he would like to begin amiodarone. Will begin amiodarone load of 200 mg BID x 1 month. F/u 2 weeks to consider if needs cardioversion.   He could be also be a candidate for Multaq or Tikosyn. If he chose  Tikosyn in the future, could not take hydrochlorothiazide and would have to watch potential interaction with Remeron. CrCl estimate 70 mL/min.   Qtc 420-450 ms in NSR.   Secondary Hypercoagulable State (ICD10:  D68.69) The patient is at significant risk for stroke/thromboembolism based upon his CHA2DS2-VASc Score of 4.  Continue Apixaban (Eliquis).  No missed doses. Advised patient to contact office if he has recurrent epistaxis. Would discuss with Cardiology if able to discontinue Plavix. If this is not a possibility, then may need to discuss Watchman device in the future.    Follow up 2 weeks.   [place], PA-C  Afib Clinic [facility] [address] [place], [place] [address] [phone]

## 2023-06-21 NOTE — Telephone Encounter (Signed)
Just realized patient is already enrolled :)  Refills should continue until 09/13/23.  Will mail renewal

## 2023-06-29 ENCOUNTER — Ambulatory Visit (HOSPITAL_COMMUNITY)
Admission: RE | Admit: 2023-06-29 | Discharge: 2023-06-29 | Disposition: A | Discharge status: Home / Self Care | Payer: Medicare Other | Source: Ambulatory Visit | Attending: Internal Medicine | Admitting: Internal Medicine

## 2023-06-29 VITALS — BP 138/76 | HR 63 | Ht 70.0 in | Wt 191.2 lb

## 2023-06-29 DIAGNOSIS — D6869 Other thrombophilia: Secondary | ICD-10-CM | POA: Insufficient documentation

## 2023-06-29 DIAGNOSIS — I129 Hypertensive chronic kidney disease with stage 1 through stage 4 chronic kidney disease, or unspecified chronic kidney disease: Secondary | ICD-10-CM | POA: Insufficient documentation

## 2023-06-29 DIAGNOSIS — E1122 Type 2 diabetes mellitus with diabetic chronic kidney disease: Secondary | ICD-10-CM | POA: Insufficient documentation

## 2023-06-29 DIAGNOSIS — Z955 Presence of coronary angioplasty implant and graft: Secondary | ICD-10-CM | POA: Insufficient documentation

## 2023-06-29 DIAGNOSIS — I4891 Unspecified atrial fibrillation: Secondary | ICD-10-CM

## 2023-06-29 DIAGNOSIS — I4819 Other persistent atrial fibrillation: Secondary | ICD-10-CM | POA: Insufficient documentation

## 2023-06-29 DIAGNOSIS — Z79899 Other long term (current) drug therapy: Secondary | ICD-10-CM | POA: Insufficient documentation

## 2023-06-29 DIAGNOSIS — Z5181 Encounter for therapeutic drug level monitoring: Secondary | ICD-10-CM

## 2023-06-29 DIAGNOSIS — Z7901 Long term (current) use of anticoagulants: Secondary | ICD-10-CM | POA: Diagnosis not present

## 2023-06-29 DIAGNOSIS — I2510 Atherosclerotic heart disease of native coronary artery without angina pectoris: Secondary | ICD-10-CM | POA: Diagnosis not present

## 2023-06-29 DIAGNOSIS — N189 Chronic kidney disease, unspecified: Secondary | ICD-10-CM | POA: Insufficient documentation

## 2023-06-29 NOTE — Progress Notes (Addendum)
Primary Care Physician: Gabriel Earing, FNP Primary Cardiologist: Tonny Bollman, MD Electrophysiologist: None     Referring Physician: Dr. Madolyn Frieze TAE VONADA is a 67 y.o. male with a history of CAD s/p stent, T2DM, HTN, HLD, chronic kidney disease, OSA intolerant of CPAP, and persistent atrial fibrillation who presents for consultation in the [facility]. Seen by Dr. Excell Seltzer on 8/22 and scheduled for DCCV after 3 weeks of anticoagulation on Eliquis. He underwent successful DCCV on 05/30/23. Patient is on Eliquis for a CHADS2VASC score of 4.  On evaluation today, he is currently in NSR. He feels much better since cardioversion. He definitely could feel when he was in Afib and described it as a bird fluttering in his chest with feeling tired and SOB. He is currently taking Eliquis 5 mg BID and does not have bleeding issues.   On follow up 10/2, he is currently in Afib. Seen in ED on 9/27 for Afib with RVR. He converted to NSR and was sent home on Lopressor 50 mg BID. Nosebleed on Monday that lasted intermittently for several hours. He feels tired and SOB when in Afib. He has not had any additional episodes of epistaxis prior or since then. No missed doses of anticoagulant.   On follow up 06/29/23, he is currently in NSR. He began amiodarone last office visit for AAD therapy as bridge to ablation. He is currently taking amiodarone 200 mg BID as initial load. No missed doses of Eliquis. He feels much better being in NSR. He had the single episode of epistaxis noted last office visit but none since then.   Today, he denies symptoms of chest pain, orthopnea, PND, lower extremity edema, dizziness, presyncope, syncope, snoring, daytime somnolence, bleeding, or neurologic sequela. The patient is tolerating medications without difficulties and is otherwise without complaint today.    Atrial Fibrillation Risk Factors:  he does have symptoms or diagnosis of sleep  apnea. he is not compliant with CPAP therapy.  he has a BMI of Body mass index is 27.43 kg/m.Marland Kitchen Filed Weights   06/29/23 1130  Weight: 86.7 kg     Current Outpatient Medications  Medication Sig Dispense Refill   albuterol (VENTOLIN HFA) 108 (90 Base) MCG/ACT inhaler TAKE 2 PUFFS BY MOUTH EVERY 6 HOURS AS NEEDED FOR WHEEZE OR SHORTNESS OF BREATH 8.5 each 2   amiodarone (PACERONE) 200 MG tablet Take 1 tablet (200 mg total) by mouth in the morning and at bedtime. 60 tablet 2   amLODipine (NORVASC) 5 MG tablet Take 1 tablet (5 mg total) by mouth daily. 90 tablet 3   apixaban (ELIQUIS) 5 MG TABS tablet Take 1 tablet (5 mg total) by mouth 2 (two) times daily. 180 tablet 3   atorvastatin (LIPITOR) 40 MG tablet Take 1 tablet (40 mg total) by mouth daily at 6 PM. 90 tablet 3   Cholecalciferol (VITAMIN D) 50 MCG (2000 UT) tablet Take 2,000 Units by mouth daily.     clopidogrel (PLAVIX) 75 MG tablet Take 1 tablet (75 mg total) by mouth daily. 90 tablet 3   Coenzyme Q10 (COQ-10) 100 MG CAPS Take 100 mg by mouth daily.     dapagliflozin propanediol (FARXIGA) 10 MG TABS tablet Take 1 tablet (10 mg total) by mouth daily before breakfast. 90 tablet 4   DULoxetine (CYMBALTA) 30 MG capsule Take 1 capsule (30 mg total) by mouth daily. 90 capsule 3   ezetimibe (ZETIA) 10 MG tablet TAKE  1 TABLET BY MOUTH EVERY DAY 90 tablet 3   FOLIC ACID-B6-B12-INTRINS FACT PO Take 1 tablet by mouth daily.     hydrALAZINE (APRESOLINE) 50 MG tablet Take 1 tablet (50 mg total) by mouth every 8 (eight) hours. (Patient taking differently: Take 50 mg by mouth in the morning and at bedtime.) 270 tablet 3   hydrochlorothiazide (HYDRODIURIL) 12.5 MG tablet Take 1 tablet (12.5 mg total) by mouth daily. (NEEDS TO BE SEEN BEFORE NEXT REFILL) (Patient taking differently: Take 12.5 mg by mouth daily as needed (Swelling in ankles). (NEEDS TO BE SEEN BEFORE NEXT REFILL)) 90 tablet 0   isosorbide mononitrate (IMDUR) 30 MG 24 hr tablet TAKE  1/2 OF A TABLET (15 MG TOTAL) BY MOUTH DAILY 45 tablet 3   metoprolol tartrate (LOPRESSOR) 50 MG tablet Take 1 tablet (50 mg total) by mouth 2 (two) times daily. 180 tablet 3   mirtazapine (REMERON) 15 MG tablet Take 15 mg by mouth at bedtime.     nitroGLYCERIN (NITROSTAT) 0.4 MG SL tablet DISSOLVE 1 TAB UNDER TONGUE AT ONSET OF CHEST PAIN. REPEAT EVERY 5 MINUTES FOR 2 DOSES AS NEEDED 25 tablet 2   RESTASIS 0.05 % ophthalmic emulsion Place 1 drop into both eyes daily.     tamsulosin (FLOMAX) 0.4 MG CAPS capsule Take 1 capsule (0.4 mg total) by mouth daily. 90 capsule 3   No current facility-administered medications for this encounter.    Atrial Fibrillation Management history:  Previous antiarrhythmic drugs: amiodarone Previous cardioversions: 05/30/23 Previous ablations: none Anticoagulation history: Eliquis   ROS- All systems are reviewed and negative except as per the HPI above.  Physical Exam: BP 138/76   Pulse 63   Ht 5\' 10"  (1.778 m)   Wt 86.7 kg   BMI 27.43 kg/m   GEN- The patient is well appearing, alert and oriented x 3 today.   Neck - no JVD or carotid bruit noted Lungs- Clear to ausculation bilaterally, normal work of breathing Heart- Regular rate and rhythm, no murmurs, rubs or gallops, PMI not laterally displaced Extremities- no clubbing, cyanosis, or edema Skin - no rash or ecchymosis noted   EKG today demonstrates  Vent. rate 63 BPM PR interval 188 ms QRS duration 102 ms QT/QTcB 440/450 ms P-R-T axes 74 83 79 Normal sinus rhythm Normal ECG When compared with ECG of 15-Jun-2023 10:09, PREVIOUS ECG IS PRESENT  Echo 05/24/23 demonstrated   1. Left ventricular ejection fraction, by estimation, is 55 to 60%. The  left ventricle has normal function. The left ventricle has no regional  wall motion abnormalities. There is mild left ventricular hypertrophy.  Left ventricular diastolic parameters  are indeterminate.   2. Right ventricular systolic function is  normal. The right ventricular  size is normal.   3. The mitral valve is normal in structure. Trivial mitral valve  regurgitation.   4. The aortic valve was not well visualized. Aortic valve regurgitation  is trivial. No aortic stenosis is present.   5. The inferior vena cava is normal in size with greater than 50%  respiratory variability, suggesting right atrial pressure of 3 mmHg.    ASSESSMENT & PLAN CHA2DS2-VASc Score = 4  The patient's score is based upon: CHF History: 0 HTN History: 1 Diabetes History: 1 Stroke History: 0 Vascular Disease History: 1 Age Score: 1 Gender Score: 0       ASSESSMENT AND PLAN: Persistent Atrial Fibrillation (ICD10:  I48.19) The patient's CHA2DS2-VASc score is 4, indicating a 4.8% annual risk  of stroke.    He is currently in NSR. He will complete amiodarone load of 200 mg BID x 1 month total then transition to 200 mg once daily. He has upcoming appointment with Dr. Jimmey Ralph to discuss ablation. Depending on date of ablation, would see patient back for 3 month amiodarone surveillance.   Qtc stable. Continue amiodarone load.    Secondary Hypercoagulable State (ICD10:  D68.69) The patient is at significant risk for stroke/thromboembolism based upon his CHA2DS2-VASc Score of 4.  Continue Apixaban (Eliquis).   Given the episode of epistaxis noted at last OV and Hgb of 12.5, will order repeat CBC to recheck.    Follow up as scheduled with Dr. Jimmey Ralph.   Lake Bells, PA-C  Afib Clinic [facility] [address] [place], [place] [address] [phone]

## 2023-07-13 ENCOUNTER — Encounter (HOSPITAL_COMMUNITY): Payer: Self-pay

## 2023-07-13 ENCOUNTER — Other Ambulatory Visit (HOSPITAL_COMMUNITY): Payer: Medicare Other | Admitting: Physician Assistant

## 2023-07-18 ENCOUNTER — Telehealth: Payer: Self-pay

## 2023-07-18 NOTE — Telephone Encounter (Signed)
Transition Care Management Unsuccessful Follow-up Telephone Call  Date of discharge and from where:  [name] Foster 9/27  Attempts:  2nd Attempt  Reason for unsuccessful TCM follow-up call:  No answer/busy   [name] Foster [name]  [name] Guide, Phone: [phone] Website: [name]

## 2023-07-18 NOTE — Telephone Encounter (Signed)
Transition Care Management Unsuccessful Follow-up Telephone Call  Date of discharge and from where:  [facility]  Attempts:  2nd Attempt  Reason for unsuccessful TCM follow-up call:  No answer/busy   Lenard Forth [name]  [name] Guide, Phone: [phone] Website: [name]

## 2023-07-25 ENCOUNTER — Encounter: Payer: Self-pay | Admitting: Cardiology

## 2023-07-25 ENCOUNTER — Ambulatory Visit: Payer: Medicare Other | Attending: Cardiology | Admitting: Cardiology

## 2023-07-25 ENCOUNTER — Other Ambulatory Visit: Payer: Self-pay

## 2023-07-25 VITALS — BP 132/62 | HR 72 | Ht 69.0 in | Wt 201.0 lb

## 2023-07-25 DIAGNOSIS — I4819 Other persistent atrial fibrillation: Secondary | ICD-10-CM

## 2023-07-25 DIAGNOSIS — D6869 Other thrombophilia: Secondary | ICD-10-CM

## 2023-07-25 DIAGNOSIS — I2510 Atherosclerotic heart disease of native coronary artery without angina pectoris: Secondary | ICD-10-CM

## 2023-07-25 NOTE — Progress Notes (Signed)
Electrophysiology Office Note:   Date:  07/25/2023  [name] Foster, DOB [birth year], MRN [id number]  Primary Cardiologist: Tonny Bollman, MD Electrophysiologist: Nobie Putnam, MD      History of Present Illness:   [name] Foster is a 67 y.o. male with h/o CAD s/p multiple PCI's, T2DM, HTN, HLD, chronic kidney disease, OSA intolerant of CPAP, and persistent atrial fibrillation who is being seen today for evaluation of atrial fibrillation at the request of [place], [place].   Patient was diagnosed with atrial fibrillation in August 2024.  During his initial episode he felt diaphoretic, weak and with palpitations.  He went to see his primary care physician and was diagnosed with atrial fibrillation.  He was seen by Dr. Excell Seltzer and started on oral anticoagulation.  After 3 weeks of receiving oral anticoagulation he underwent cardioversion on 05/30/2023.  Cardioversion was successful: however, he presented back to the ED on 9/27 for atrial fibrillation with rapid ventricular response.  In the ED he converted spontaneously.  He was then seen in atrial fibrillation clinic and started on amiodarone.  Today, he presents to discuss catheter ablation.  He denies any new episodes of atrial fibrillation since his visit to A-fib clinic and starting amiodarone.  He states that he is very symptomatic when he has episodes of atrial fibrillation with palpitations, shortness of breath and fatigue.  Review of systems complete and found to be negative unless listed in HPI.   EP Information / Studies Reviewed:    EKG is not ordered today. EKG from 06/29/23 reviewed which showed normal sinus rhythm.      EKG 06/15/23:    Echo 05/24/2023: Normal LV size and function.  Mild LVH.  LVEF 55 to 60%. Normal RV size and function. No significant valvular disease.   Risk Assessment/Calculations:    CHA2DS2-VASc Score = 4   This indicates a 4.8% annual risk of stroke. The patient's score is based upon: CHF  History: 0 HTN History: 1 Diabetes History: 1 Stroke History: 0 Vascular Disease History: 1 Age Score: 1 Gender Score: 0             Physical Exam:   VS:  BP 132/62   Pulse 72   Ht 5\' 9"  (1.753 m)   Wt 201 lb (91.2 kg)   SpO2 (!) 89%   BMI 29.68 kg/m    Wt Readings from Last 3 Encounters:  07/25/23 201 lb (91.2 kg)  06/29/23 191 lb 3.2 oz (86.7 kg)  06/15/23 188 lb 9.6 oz (85.5 kg)     GEN: Well nourished, well developed in no acute distress NECK: No JVD; No carotid bruits CARDIAC: Normal rate and regular rhythm. RESPIRATORY:  Clear to auscultation without rales, wheezing or rhonchi  ABDOMEN: Soft, non-tender, non-distended EXTREMITIES:  No edema; No deformity   ASSESSMENT AND PLAN:   [name] Foster is a 67 y.o. male with h/o CAD s/p multiple PCI's, T2DM, HTN, HLD, chronic kidney disease, OSA intolerant of CPAP, and persistent atrial fibrillation who is being seen today for evaluation of atrial fibrillation at the request of [place], [place].   # Persistent atrial fibrillation, symptomatic: -Continue amiodarone as a bridge to ablation. -Continue metoprolol. -Discussed treatment options today for AF including antiarrhythmic drug therapy and ablation. Discussed risks, recovery and likelihood of success with each treatment strategy. Risk, benefits, and alternatives to EP study and ablation for afib were discussed. These risks include but are not limited to stroke, bleeding, vascular damage, tamponade, perforation,  damage to the esophagus, lungs, phrenic nerve and other structures, pulmonary vein stenosis, worsening renal function, coronary vasospasm and death.  Discussed potential need for repeat ablation procedures and antiarrhythmic drugs after an initial ablation. The patient understands these risk and wishes to proceed.  We will therefore proceed with catheter ablation at the next available time.  Carto, ICE, anesthesia are requested for the procedure.  Will also obtain CT  PV protocol prior to the procedure to exclude LAA thrombus and further evaluate atrial anatomy.  # Secondary hypercoagulable state due to atrial fibrillation: -CHA2DS2-VASc score of 4 -Continue Eliquis  # Coronary artery disease status post multiple PCI's, including most recently orbital atherectomy and stenting of the mid LAD on 07/03/2018: No chest pain. -Continue Plavix and follow-up with interventional cardiology.  Patient states that he is currently undergoing evaluation with nephrology for a renal biopsy.  I think it would be best for him to get his a renal biopsy done first when oral anticoagulation can be interrupted as needed for the procedure.  He will need to be resumed on oral anticoagulation for 3 to 4 weeks without any missed doses prior to ablation and then will need to remain on oral anticoagulation for 3 months uninterrupted following the ablation.   Signed, Nobie Putnam, MD

## 2023-07-25 NOTE — Patient Instructions (Addendum)
Medication Instructions:  Your physician recommends that you continue on your current medications as directed. Please refer to the Current Medication list given to you today.  *If you need a refill on your cardiac medications before your next appointment, please call your pharmacy*  Lab Work: BMET and CBC prior to your CT scan and ablation - at any [facility] location.  Testing/Procedures: Your physician has requested that you have cardiac CT. Cardiac computed tomography (CT) is a painless test that uses an x-ray machine to take clear, detailed pictures of your heart. We will call you to schedule your CT scan. It will be done about 2-3 weeks prior to your ablation.  Your physician has recommended that you have an ablation. Catheter ablation is a medical procedure used to treat some cardiac arrhythmias (irregular heartbeats). During catheter ablation, a long, thin, flexible tube is put into a blood vessel in your groin (upper thigh), or neck. This tube is called an ablation catheter. It is then guided to your heart through the blood vessel. Radio frequency waves destroy small areas of heart tissue where abnormal heartbeats may cause an arrhythmia to start. You are scheduled for Atrial Fibrillation Ablation on Monday, January 6 with Dr. Michele Rockers.Please arrive at the Main Entrance A at [facility]: [address] [place], [place] [address] at 8:30 AM    Follow-Up: At [facility], you and your health needs are our priority.  As part of our continuing mission to provide you with exceptional heart care, we have created designated Provider Care Teams.  These Care Teams include your primary Cardiologist (physician) and Advanced Practice Providers (APPs -  Physician Assistants and Nurse Practitioners) who all work together to provide you with the care you need, when you need it.  Your next appointment:   We will call you to arrange your follow up appointments

## 2023-08-03 ENCOUNTER — Encounter: Payer: Self-pay | Admitting: Family Medicine

## 2023-08-03 ENCOUNTER — Ambulatory Visit: Payer: Medicare Other | Admitting: Family Medicine

## 2023-08-03 VITALS — BP 125/58 | HR 67 | Temp 98.6°F | Ht 69.0 in | Wt 200.0 lb

## 2023-08-03 DIAGNOSIS — E1159 Type 2 diabetes mellitus with other circulatory complications: Secondary | ICD-10-CM

## 2023-08-03 DIAGNOSIS — N1831 Chronic kidney disease, stage 3a: Secondary | ICD-10-CM

## 2023-08-03 DIAGNOSIS — R0602 Shortness of breath: Secondary | ICD-10-CM

## 2023-08-03 DIAGNOSIS — Z23 Encounter for immunization: Secondary | ICD-10-CM | POA: Diagnosis not present

## 2023-08-03 DIAGNOSIS — E1169 Type 2 diabetes mellitus with other specified complication: Secondary | ICD-10-CM | POA: Diagnosis not present

## 2023-08-03 DIAGNOSIS — I2510 Atherosclerotic heart disease of native coronary artery without angina pectoris: Secondary | ICD-10-CM

## 2023-08-03 DIAGNOSIS — E1165 Type 2 diabetes mellitus with hyperglycemia: Secondary | ICD-10-CM | POA: Diagnosis not present

## 2023-08-03 DIAGNOSIS — I4819 Other persistent atrial fibrillation: Secondary | ICD-10-CM

## 2023-08-03 DIAGNOSIS — R399 Unspecified symptoms and signs involving the genitourinary system: Secondary | ICD-10-CM

## 2023-08-03 DIAGNOSIS — I152 Hypertension secondary to endocrine disorders: Secondary | ICD-10-CM

## 2023-08-03 DIAGNOSIS — R600 Localized edema: Secondary | ICD-10-CM

## 2023-08-03 DIAGNOSIS — Z7901 Long term (current) use of anticoagulants: Secondary | ICD-10-CM

## 2023-08-03 DIAGNOSIS — I700 Atherosclerosis of aorta: Secondary | ICD-10-CM

## 2023-08-03 LAB — BAYER DCA HB A1C WAIVED: HB A1C (BAYER DCA - WAIVED): 5.5 % (ref 4.8–5.6)

## 2023-08-03 MED ORDER — NITROGLYCERIN 0.4 MG SL SUBL: SUBLINGUAL_TABLET | SUBLINGUAL | 2 refills | Status: AC

## 2023-08-03 MED ORDER — ALBUTEROL SULFATE HFA 108 (90 BASE) MCG/ACT IN AERS: 1.0000 | INHALATION_SPRAY | RESPIRATORY_TRACT | 2 refills | Status: AC | PRN

## 2023-08-03 MED ORDER — HYDROCHLOROTHIAZIDE 12.5 MG PO TABS
12.5000 mg | ORAL_TABLET | Freq: Every day | ORAL | 3 refills | Status: AC | PRN
Start: 2023-08-03 — End: ?

## 2023-08-03 NOTE — Progress Notes (Signed)
Established Patient Office Visit  Subjective   Patient ID: [name] Foster, male    DOB: 1956-07-15  Age: 67 y.o. MRN: [id number]  Chief Complaint  Patient presents with   Medical Management of Chronic Issues   Diabetes    HPI  DM Pt presents for follow up evaluation of Type 2 diabetes mellitus.  Patient denies hypoglycemia , increased appetite, nausea, polydipsia, polyuria, visual disturbances, vomiting, and weight loss.  Current diabetic medications include farixga   Current monitoring regimen: rarely Any episodes of hypoglycemia? no  Is He on ACE inhibitor or angiotensin II receptor blocker?  No Is He on statin? Yes   Reports diet is "pretty good". Not exercising.   2. HTN Complaint with meds - Yes Current Medications - amlodipine, hydralazine, HCTZ, metoprolol BP at home: 120s//70s Pertinent ROS:  Headache - No Fatigue - No Visual Disturbances - No Chest pain - No Dyspnea - Yes, cardiology is aware Palpitations - No LE edema - sometimes dependent edema  Has seen cardiology for persistent A. Fib. On eliquis and amiodarone. Underwent cardioversion without success. Has ablation scheduled.   3. HLD On statin and zetia.   4. CKD Has not followed up with nephrology. Has renal biopsy planned for after the ablation.   5. Chronic pain On cymbalta. Reports that this has taken the edge off of his neuropathy. Neuropathy has been a little worse. He has work with up with a neuropathy clinic in [place].   6. Insomnia He has been taking mirtazapine with good relief.     7. LUTS symptoms Improved with flomax. Gets up once a night now. Denies dysuria.      05/12/2023    2:53 PM 05/02/2023    9:50 AM 01/28/2023   10:37 AM  Depression screen PHQ 2/9  Decreased Interest 0 0 0  Down, Depressed, Hopeless 0 0 0  PHQ - 2 Score 0 0 0  Altered sleeping 0 1 1  Tired, decreased energy 0 1 1  Change in appetite 0 1 0  Feeling bad or failure about yourself  0 0 0   Trouble concentrating 0 0 0  Moving slowly or fidgety/restless 0 0 0  Suicidal thoughts 0 0 0  PHQ-9 Score 0 3 2  Difficult doing work/chores Not difficult at all Not difficult at all Not difficult at all      05/02/2023    9:50 AM 01/28/2023   10:37 AM 10/29/2022   10:19 AM 07/29/2022   12:03 PM  GAD 7 : Generalized Anxiety Score  Nervous, Anxious, on Edge 0 0 0 0  Control/stop worrying 0 0 0 0  Worry too much - different things 0 0 0 0  Trouble relaxing 0 0 0 0  Restless 0 0 0 0  Easily annoyed or irritable 0 0 0 0  Afraid - awful might happen 0 0 0 0  Total GAD 7 Score 0 0 0 0  Anxiety Difficulty Not difficult at all Not difficult at all Not difficult at all Not difficult at all     Past Medical History:  Diagnosis Date   Arthritis    CAD (coronary artery disease)    a. 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 DES to left PDA 09/20/17 // c. 10/19: LCx and LPDA stents ok; s/p DES to pLAD and POBA to D1  Diabetes mellitus without complication (HCC)    type 2    Diabetic nephropathy (HCC)    Echocardiogram 08/2021    Echocardiogram 12/22: EF 60-65, no RWMA, GLS -19.4% (normal), normal RVSF, trivial MR, mild dilation of ascending aorta (37 mm)   Heart attack (HCC)    " a blood clot is actually what caused my heart attack in 2018 " , he states he is unsure where the blood clot originated from    Hyperlipidemia    Hypertension    Insomnia    Pulmonary hypertension (HCC)    a. TTE 03/25/17; EF 60-65%, nl WM, LV diastolic fxn nl, trivial AI, RV cavity size, wall thickness, and sys fxn nl, trivial TR, PASP 39 mmHg, trivial pericardial effusion // Echo 12/22: normal RVSP, PASP   SCCA (squamous cell carcinoma) of skin 03/26/2020   Mid Parietal Scalp (well diff)   Sleep apnea    could not use a cpap   Vitamin B12 deficiency 05/03/2020   Wears dentures     full top-partial bottom   Wears glasses       ROS As per HPI.    Objective:     BP (!) 125/58   Pulse 67   Temp 98.6 F (37 C) (Temporal)   Ht 5\' 9"  (1.753 m)   Wt 200 lb (90.7 kg)   SpO2 94%   BMI 29.53 kg/m  BP Readings from Last 3 Encounters:  08/03/23 (!) 125/58  07/25/23 132/62  06/29/23 138/76   Wt Readings from Last 3 Encounters:  08/03/23 200 lb (90.7 kg)  07/25/23 201 lb (91.2 kg)  06/29/23 191 lb 3.2 oz (86.7 kg)      Physical Exam Vitals and nursing note reviewed.  Constitutional:      General: He is not in acute distress.    Appearance: He is not ill-appearing, toxic-appearing or diaphoretic.  HENT:     Nose: Nose normal.     Mouth/Throat:     Mouth: Mucous membranes are moist.     Pharynx: Oropharynx is clear.  Eyes:     General:        Right eye: No discharge.        Left eye: No discharge.     Extraocular Movements: Extraocular movements intact.     Conjunctiva/sclera: Conjunctivae normal.     Pupils: Pupils are equal, round, and reactive to light.  Neck:     Thyroid: No thyroid mass, thyromegaly or thyroid tenderness.     Vascular: No carotid bruit.  Cardiovascular:     Rate and Rhythm: Normal rate and regular rhythm.     Heart sounds: Normal heart sounds. No murmur heard. Pulmonary:     Effort: Pulmonary effort is normal.     Breath sounds: Normal breath sounds.  Abdominal:     General: Bowel sounds are normal. There is no distension.     Palpations: Abdomen is soft.     Tenderness: There is no abdominal tenderness. There is no guarding or rebound.  Musculoskeletal:     Right lower leg: No edema.     Left lower leg: No edema.  Skin:    General: Skin is warm and dry.  Neurological:     General: No focal deficit present.     Mental Status: He is alert and oriented to person, place, and time.  Psychiatric:        Mood and Affect: Mood normal.        Behavior: Behavior normal.  No results found for any visits on  08/03/23.  Last CBC Lab Results  Component Value Date   WBC 6.1 06/10/2023   HGB 12.5 (L) 06/10/2023   HCT 38.3 (L) 06/10/2023   MCV 91.6 06/10/2023   MCH 29.9 06/10/2023   RDW 15.1 06/10/2023   PLT 138 (L) 06/10/2023   Last metabolic panel Lab Results  Component Value Date   GLUCOSE 100 (H) 06/10/2023   NA 141 06/10/2023   K 3.7 06/10/2023   CL 104 06/10/2023   CO2 20 (L) 06/10/2023   BUN 19 06/10/2023   CREATININE 1.37 (H) 06/10/2023   GFRNONAA 56 (L) 06/10/2023   CALCIUM 8.0 (L) 06/10/2023   PHOS 2.5 02/10/2022   PROT 6.7 03/23/2023   ALBUMIN 4.3 03/23/2023   LABGLOB 2.2 10/29/2022   AGRATIO 1.8 10/29/2022   BILITOT 0.2 03/23/2023   ALKPHOS 76 03/23/2023   AST 21 03/23/2023   ALT 14 03/23/2023   ANIONGAP 17 (H) 06/10/2023   Last lipids Lab Results  Component Value Date   CHOL 119 03/23/2023   HDL 51 03/23/2023   LDLCALC 47 03/23/2023   TRIG 117 03/23/2023   CHOLHDL 2.3 03/23/2023   Last vitamin B12 and Folate Lab Results  Component Value Date   VITAMINB12 252 02/11/2022   FOLATE 5.5 (L) 02/11/2022      The ASCVD Risk score (Arnett DK, et al., 2019) failed to calculate for the following reasons:   The patient has a prior MI or stroke diagnosis    Assessment & Plan:   Alisha was seen today for medical management of chronic issues and diabetes.  Diagnoses and all orders for this visit:  Type 2 diabetes mellitus with hyperglycemia, without long-term current use of insulin (HCC) A1c 5.5 today, at goal of <7. Medication changes today: continue farxiga. He has declined ACE/ARB. He is on a statin. Eye exam: UTD. Foot exam: today. Urine micro: UTD. Diet and exercise.  -     Bayer DCA Hb A1c Waived  Hyperlipidemia associated with type 2 diabetes mellitus (HCC) Last LDL at goal. On statin and zetia.   Hypertension associated with diabetes (HCC) Well controlled on current regimen.  -     hydrochlorothiazide (HYDRODIURIL) 12.5 MG tablet; Take 1 tablet  (12.5 mg total) by mouth daily as needed (Swelling in ankles).  Persistent atrial fibrillation (HCC) Chronic anticoagulation Managed by cardiology. Has upcoming ablation. Regular rate and rhythm. Continue eliquis.   Coronary artery disease involving native coronary artery of native heart without angina pectoris Arthrosclerosis of aorta On statin. Refill provided. Continue follow up with cardiology.  -     nitroGLYCERIN (NITROSTAT) 0.4 MG SL tablet; DISSOLVE 1 TAB UNDER TONGUE AT ONSET OF CHEST PAIN. REPEAT EVERY 5 MINUTES FOR 2 DOSES AS NEEDED  Stage 3a chronic kidney disease (HCC) Managed by nephrology. On farxiga. Avoid NSAIDs.   Lower urinary tract symptoms (LUTS) Well controlled with flomax.   Shortness of breath Refill provided.  -     albuterol (VENTOLIN HFA) 108 (90 Base) MCG/ACT inhaler; Inhale 1-2 puffs into the lungs every 4 (four) hours as needed for wheezing or shortness of breath.  Need for immunization against influenza Flu vaccine today.   Return in about 3 months (around 11/03/2023) for chronic follow up.   The patient indicates understanding of these issues and agrees with the plan.  Gabriel Earing, FNP

## 2023-08-23 ENCOUNTER — Telehealth (HOSPITAL_COMMUNITY): Payer: Self-pay | Admitting: *Deleted

## 2023-08-23 NOTE — Telephone Encounter (Signed)
Reaching out to patient to offer assistance regarding upcoming cardiac imaging study; pt verbalizes understanding of appt date/time, parking situation and where to check in, pre-test NPO status and medications ordered, and verified current allergies; name and call back number provided for further questions should they arise Hayley Sharpe RN Navigator Cardiac Imaging [facility] [phone] office [phone] cell  

## 2023-08-24 ENCOUNTER — Ambulatory Visit (HOSPITAL_COMMUNITY)
Admission: RE | Admit: 2023-08-24 | Discharge: 2023-08-24 | Disposition: A | Discharge status: Home / Self Care | Payer: Medicare Other | Source: Ambulatory Visit | Attending: Cardiology | Admitting: Cardiology

## 2023-08-24 DIAGNOSIS — I4819 Other persistent atrial fibrillation: Secondary | ICD-10-CM | POA: Insufficient documentation

## 2023-08-24 LAB — POCT I-STAT CREATININE: Creatinine, Ser: 1.9 mg/dL — ABNORMAL HIGH (ref 0.61–1.24)

## 2023-08-24 MED ORDER — IOHEXOL 350 MG/ML SOLN
95.0000 mL | Freq: Once | INTRAVENOUS | Status: AC | PRN
  Administered 2023-08-24: 95 mL via INTRAVENOUS

## 2023-08-31 ENCOUNTER — Ambulatory Visit (HOSPITAL_COMMUNITY): Payer: Medicare Other | Admitting: Internal Medicine

## 2023-09-09 ENCOUNTER — Other Ambulatory Visit (HOSPITAL_COMMUNITY): Payer: Self-pay | Admitting: Internal Medicine

## 2023-09-16 ENCOUNTER — Telehealth: Payer: Self-pay

## 2023-09-16 NOTE — Pre-Procedure Instructions (Signed)
Instructed patient on the following items: Arrival time 0800 Nothing to eat or drink after midnight No meds AM of procedure Responsible person to drive you home and stay with you for 24 hrs  Have you missed any doses of anti-coagulant Eliquis- takes twice a day, hasn't missed any doses.  Don't take dose on Monday morning.

## 2023-09-16 NOTE — Telephone Encounter (Signed)
 Patient ablation canceled due to prior auth still pending on new insurance. Arne Cleveland made patient made aware already. Will need to reschedule.

## 2023-09-19 ENCOUNTER — Encounter (HOSPITAL_COMMUNITY): Admission: RE | Payer: Self-pay | Source: Home / Self Care

## 2023-09-19 ENCOUNTER — Ambulatory Visit (HOSPITAL_COMMUNITY): Admission: RE | Admit: 2023-09-19 | Payer: Medicare Other | Source: Home / Self Care | Admitting: Cardiology

## 2023-09-19 SURGERY — ATRIAL FIBRILLATION ABLATION
Anesthesia: General

## 2023-09-19 NOTE — Telephone Encounter (Signed)
 Patient has been rescheduled, message sent to prior auth team.

## 2023-10-05 NOTE — Anesthesia Preprocedure Evaluation (Signed)
Anesthesia Evaluation  Patient identified by MRN, date of birth, ID band Patient awake    Reviewed: Allergy & Precautions, NPO status , Patient's Chart, lab work & pertinent test results, reviewed documented beta blocker date and time   Airway Mallampati: III  TM Distance: <3 FB Neck ROM: Full    Dental  (+) Edentulous Upper, Partial Lower, Dental Advisory Given   Pulmonary sleep apnea , former smoker   Pulmonary exam normal breath sounds clear to auscultation       Cardiovascular hypertension, Pt. on medications pulmonary hypertension+ CAD, + Past MI and + Cardiac Stents  Normal cardiovascular exam+ dysrhythmias Atrial Fibrillation  Rhythm:Regular Rate:Normal  . Left ventricular ejection fraction, by estimation, is 55 to 60%. The  left ventricle has normal function. The left ventricle has no regional  wall motion abnormalities. There is mild left ventricular hypertrophy.  Left ventricular diastolic parameters  are indeterminate.   2. Right ventricular systolic function is normal. The right ventricular  size is normal.   3. The mitral valve is normal in structure. Trivial mitral valve  regurgitation.   4. The aortic valve was not well visualized. Aortic valve regurgitation  is trivial. No aortic stenosis is present.   5. The inferior vena cava is normal in size with greater than 50%  respiratory variability, suggesting right atrial pressure of 3 mmHg     Neuro/Psych Diabetic neuropathy  negative psych ROS   GI/Hepatic negative GI ROS, Neg liver ROS,,,  Endo/Other  diabetes, Well Controlled, Type 2, Oral Hypoglycemic Agents  Marcelline Deist -last doe 1/19  Renal/GU Renal disease  negative genitourinary   Musculoskeletal  (+) Arthritis , Osteoarthritis,    Abdominal   Peds  Hematology  (+) Blood dyscrasia, anemia Eliquis therapy- last dose yesterday pm   Anesthesia Other Findings   Reproductive/Obstetrics                              Anesthesia Physical Anesthesia Plan  ASA: 3  Anesthesia Plan: General   Post-op Pain Management: Minimal or no pain anticipated   Induction: Intravenous  PONV Risk Score and Plan: 2 and Treatment may vary due to age or medical condition and TIVA  Airway Management Planned: Oral ETT  Additional Equipment: Arterial line  Intra-op Plan:   Post-operative Plan: Extubation in OR  Informed Consent: I have reviewed the patients History and Physical, chart, labs and discussed the procedure including the risks, benefits and alternatives for the proposed anesthesia with the patient or authorized representative who has indicated his/her understanding and acceptance.       Plan Discussed with: Anesthesiologist and CRNA  Anesthesia Plan Comments:        Anesthesia Quick Evaluation

## 2023-10-05 NOTE — Pre-Procedure Instructions (Signed)
Instructed patient on the following items: Arrival time 0515 Nothing to eat or drink after midnight No meds AM of procedure Responsible person to drive you home and stay with you for 24 hrs  Have you missed any doses of anti-coagulant Eliquis- takes twice a day, hasn't missed any doses.  Don't take dose in the morning.

## 2023-10-06 ENCOUNTER — Other Ambulatory Visit: Payer: Self-pay

## 2023-10-06 ENCOUNTER — Ambulatory Visit (HOSPITAL_COMMUNITY)
Admission: RE | Admit: 2023-10-06 | Discharge: 2023-10-06 | Disposition: A | Discharge status: Home / Self Care | Payer: Medicare Other | Attending: Cardiology | Admitting: Cardiology

## 2023-10-06 ENCOUNTER — Ambulatory Visit (HOSPITAL_BASED_OUTPATIENT_CLINIC_OR_DEPARTMENT_OTHER): Payer: Medicare Other | Admitting: Anesthesiology

## 2023-10-06 ENCOUNTER — Encounter (HOSPITAL_COMMUNITY)
Admission: RE | Disposition: A | Discharge status: Home / Self Care | Payer: Self-pay | Source: Home / Self Care | Attending: Cardiology

## 2023-10-06 ENCOUNTER — Ambulatory Visit (HOSPITAL_COMMUNITY): Payer: Medicare Other | Admitting: Anesthesiology

## 2023-10-06 DIAGNOSIS — G4733 Obstructive sleep apnea (adult) (pediatric): Secondary | ICD-10-CM | POA: Diagnosis not present

## 2023-10-06 DIAGNOSIS — N183 Chronic kidney disease, stage 3 unspecified: Secondary | ICD-10-CM

## 2023-10-06 DIAGNOSIS — E1140 Type 2 diabetes mellitus with diabetic neuropathy, unspecified: Secondary | ICD-10-CM | POA: Insufficient documentation

## 2023-10-06 DIAGNOSIS — N189 Chronic kidney disease, unspecified: Secondary | ICD-10-CM | POA: Insufficient documentation

## 2023-10-06 DIAGNOSIS — I120 Hypertensive chronic kidney disease with stage 5 chronic kidney disease or end stage renal disease: Secondary | ICD-10-CM | POA: Diagnosis not present

## 2023-10-06 DIAGNOSIS — I252 Old myocardial infarction: Secondary | ICD-10-CM | POA: Diagnosis not present

## 2023-10-06 DIAGNOSIS — Z87891 Personal history of nicotine dependence: Secondary | ICD-10-CM | POA: Diagnosis not present

## 2023-10-06 DIAGNOSIS — Z955 Presence of coronary angioplasty implant and graft: Secondary | ICD-10-CM | POA: Diagnosis not present

## 2023-10-06 DIAGNOSIS — I4819 Other persistent atrial fibrillation: Secondary | ICD-10-CM | POA: Diagnosis present

## 2023-10-06 DIAGNOSIS — E785 Hyperlipidemia, unspecified: Secondary | ICD-10-CM | POA: Diagnosis not present

## 2023-10-06 DIAGNOSIS — D6869 Other thrombophilia: Secondary | ICD-10-CM | POA: Insufficient documentation

## 2023-10-06 DIAGNOSIS — Z7984 Long term (current) use of oral hypoglycemic drugs: Secondary | ICD-10-CM | POA: Diagnosis not present

## 2023-10-06 DIAGNOSIS — I2510 Atherosclerotic heart disease of native coronary artery without angina pectoris: Secondary | ICD-10-CM | POA: Insufficient documentation

## 2023-10-06 DIAGNOSIS — I129 Hypertensive chronic kidney disease with stage 1 through stage 4 chronic kidney disease, or unspecified chronic kidney disease: Secondary | ICD-10-CM

## 2023-10-06 DIAGNOSIS — Z7901 Long term (current) use of anticoagulants: Secondary | ICD-10-CM | POA: Insufficient documentation

## 2023-10-06 DIAGNOSIS — E1122 Type 2 diabetes mellitus with diabetic chronic kidney disease: Secondary | ICD-10-CM | POA: Diagnosis not present

## 2023-10-06 DIAGNOSIS — Z79899 Other long term (current) drug therapy: Secondary | ICD-10-CM | POA: Diagnosis not present

## 2023-10-06 HISTORY — PX: ATRIAL FIBRILLATION ABLATION: EP1191

## 2023-10-06 LAB — BASIC METABOLIC PANEL
Anion gap: 11 (ref 5–15)
BUN: 19 mg/dL (ref 8–23)
CO2: 25 mmol/L (ref 22–32)
Calcium: 9.1 mg/dL (ref 8.9–10.3)
Chloride: 98 mmol/L (ref 98–111)
Creatinine, Ser: 1.59 mg/dL — ABNORMAL HIGH (ref 0.61–1.24)
GFR, Estimated: 47 mL/min — ABNORMAL LOW (ref >60)
Glucose, Bld: 139 mg/dL — ABNORMAL HIGH (ref 70–99)
Potassium: 3.7 mmol/L (ref 3.5–5.1)
Sodium: 134 mmol/L — ABNORMAL LOW (ref 135–145)

## 2023-10-06 LAB — CBC
HCT: 43.4 % (ref 39.0–52.0)
Hemoglobin: 13.9 g/dL (ref 13.0–17.0)
MCH: 29.7 pg (ref 26.0–34.0)
MCHC: 32 g/dL (ref 30.0–36.0)
MCV: 92.7 fL (ref 80.0–100.0)
Platelets: 116 10*3/uL — ABNORMAL LOW (ref 150–400)
RBC: 4.68 MIL/uL (ref 4.22–5.81)
RDW: 14.6 % (ref 11.5–15.5)
WBC: 5 10*3/uL (ref 4.0–10.5)
nRBC: 0 % (ref 0.0–0.2)

## 2023-10-06 LAB — POCT ACTIVATED CLOTTING TIME
Activated Clotting Time: 348 s
Activated Clotting Time: 406 s

## 2023-10-06 LAB — GLUCOSE, CAPILLARY
Glucose-Capillary: 132 mg/dL — ABNORMAL HIGH (ref 70–99)
Glucose-Capillary: 160 mg/dL — ABNORMAL HIGH (ref 70–99)

## 2023-10-06 SURGERY — ATRIAL FIBRILLATION ABLATION
Anesthesia: General

## 2023-10-06 MED ORDER — SUGAMMADEX SODIUM 200 MG/2ML IV SOLN
INTRAVENOUS | Status: DC | PRN
  Administered 2023-10-06: 400 mg via INTRAVENOUS

## 2023-10-06 MED ORDER — PHENYLEPHRINE 80 MCG/ML (10ML) SYRINGE FOR IV PUSH (FOR BLOOD PRESSURE SUPPORT)
PREFILLED_SYRINGE | INTRAVENOUS | Status: DC | PRN
  Administered 2023-10-06: 80 ug via INTRAVENOUS

## 2023-10-06 MED ORDER — SODIUM CHLORIDE 0.9% FLUSH: 3.0000 mL | Freq: Two times a day (BID) | INTRAVENOUS | Status: DC

## 2023-10-06 MED ORDER — HYDRALAZINE HCL 25 MG PO TABS
25.0000 mg | ORAL_TABLET | Freq: Three times a day (TID) | ORAL | Status: DC
Start: 2023-10-06 — End: 2023-10-06
  Administered 2023-10-06: 25 mg via ORAL
  Filled 2023-10-06: qty 1

## 2023-10-06 MED ORDER — SODIUM CHLORIDE 0.9 % IV SOLN: INTRAVENOUS | Status: DC | PRN

## 2023-10-06 MED ORDER — CEFAZOLIN SODIUM-DEXTROSE 2-4 GM/100ML-% IV SOLN
INTRAVENOUS | Status: AC
  Filled 2023-10-06: qty 100

## 2023-10-06 MED ORDER — FENTANYL CITRATE (PF) 250 MCG/5ML IJ SOLN
INTRAMUSCULAR | Status: DC | PRN
  Administered 2023-10-06 (×2): 50 ug via INTRAVENOUS

## 2023-10-06 MED ORDER — CEFAZOLIN SODIUM-DEXTROSE 2-3 GM-%(50ML) IV SOLR
INTRAVENOUS | Status: DC | PRN
  Administered 2023-10-06: 2 g via INTRAVENOUS

## 2023-10-06 MED ORDER — PHENYLEPHRINE HCL-NACL 20-0.9 MG/250ML-% IV SOLN
INTRAVENOUS | Status: DC | PRN
  Administered 2023-10-06: 40 ug/min via INTRAVENOUS

## 2023-10-06 MED ORDER — SODIUM CHLORIDE 0.9 % IV SOLN: 250.0000 mL | INTRAVENOUS | Status: DC | PRN

## 2023-10-06 MED ORDER — ACETAMINOPHEN 325 MG PO TABS: 650.0000 mg | ORAL_TABLET | ORAL | Status: DC | PRN

## 2023-10-06 MED ORDER — APIXABAN 5 MG PO TABS
5.0000 mg | ORAL_TABLET | Freq: Once | ORAL | Status: AC
  Administered 2023-10-06: 5 mg via ORAL
  Filled 2023-10-06: qty 1

## 2023-10-06 MED ORDER — PROPOFOL 10 MG/ML IV BOLUS
INTRAVENOUS | Status: DC | PRN
  Administered 2023-10-06: 150 mg via INTRAVENOUS

## 2023-10-06 MED ORDER — ONDANSETRON HCL 4 MG/2ML IJ SOLN
4.0000 mg | Freq: Four times a day (QID) | INTRAMUSCULAR | Status: DC | PRN
Start: 2023-10-06 — End: 2023-10-06

## 2023-10-06 MED ORDER — DEXAMETHASONE SODIUM PHOSPHATE 10 MG/ML IJ SOLN
INTRAMUSCULAR | Status: DC | PRN
  Administered 2023-10-06: 5 mg via INTRAVENOUS

## 2023-10-06 MED ORDER — HEPARIN (PORCINE) IN NACL 1000-0.9 UT/500ML-% IV SOLN
INTRAVENOUS | Status: DC | PRN
  Administered 2023-10-06 (×3): 500 mL

## 2023-10-06 MED ORDER — ROCURONIUM BROMIDE 10 MG/ML (PF) SYRINGE
PREFILLED_SYRINGE | INTRAVENOUS | Status: DC | PRN
  Administered 2023-10-06: 70 mg via INTRAVENOUS

## 2023-10-06 MED ORDER — HEPARIN SODIUM (PORCINE) 1000 UNIT/ML IJ SOLN
INTRAMUSCULAR | Status: DC | PRN
  Administered 2023-10-06: 15000 [IU] via INTRAVENOUS

## 2023-10-06 MED ORDER — PROTAMINE SULFATE 10 MG/ML IV SOLN
INTRAVENOUS | Status: DC | PRN
  Administered 2023-10-06: 35 mg via INTRAVENOUS

## 2023-10-06 MED ORDER — FENTANYL CITRATE (PF) 100 MCG/2ML IJ SOLN
INTRAMUSCULAR | Status: AC
  Filled 2023-10-06: qty 2

## 2023-10-06 MED ORDER — ONDANSETRON HCL 4 MG/2ML IJ SOLN
INTRAMUSCULAR | Status: DC | PRN
  Administered 2023-10-06: 4 mg via INTRAVENOUS

## 2023-10-06 MED ORDER — EPHEDRINE SULFATE-NACL 50-0.9 MG/10ML-% IV SOSY
PREFILLED_SYRINGE | INTRAVENOUS | Status: DC | PRN
  Administered 2023-10-06 (×2): 5 mg via INTRAVENOUS
  Administered 2023-10-06: 10 mg via INTRAVENOUS

## 2023-10-06 MED ORDER — ATROPINE SULFATE 1 MG/ML IV SOLN
INTRAVENOUS | Status: DC | PRN
  Administered 2023-10-06: 1 mg via INTRAVENOUS

## 2023-10-06 MED ORDER — SODIUM CHLORIDE 0.9% FLUSH: 3.0000 mL | INTRAVENOUS | Status: DC | PRN

## 2023-10-06 MED ORDER — LIDOCAINE 2% (20 MG/ML) 5 ML SYRINGE
INTRAMUSCULAR | Status: DC | PRN
  Administered 2023-10-06: 60 mg via INTRAVENOUS

## 2023-10-06 SURGICAL SUPPLY — 21 items
BAG SNAP BAND KOVER 36X36 (MISCELLANEOUS) IMPLANT
CABLE PFA RX CATH CONN (CABLE) IMPLANT
CATH FARAWAVE ABLATION 31 (CATHETERS) IMPLANT
CATH OCTARAY 2.0 F 3-3-3-3-3 (CATHETERS) IMPLANT
CATH SOUNDSTAR ECO 8FR (CATHETERS) IMPLANT
CATH WEBSTER BI DIR CS D-F CRV (CATHETERS) IMPLANT
CLOSURE PERCLOSE PROSTYLE (VASCULAR PRODUCTS) IMPLANT
COVER SWIFTLINK CONNECTOR (BAG) ×1 IMPLANT
DEVICE CLOSURE MYNXGRIP 6/7F (Vascular Products) IMPLANT
DILATOR VESSEL 38 20CM 16FR (INTRODUCER) IMPLANT
GUIDEWIRE INQWIRE 1.5J.035X260 (WIRE) IMPLANT
INQWIRE 1.5J .035X260CM (WIRE) ×1
KIT VERSACROSS CNCT FARADRIVE (KITS) IMPLANT
MAT PREVALON FULL STRYKER (MISCELLANEOUS) IMPLANT
PACK EP LF (CUSTOM PROCEDURE TRAY) ×1 IMPLANT
PAD DEFIB RADIO PHYSIO CONN (PAD) ×1 IMPLANT
PATCH CARTO3 (PAD) IMPLANT
SHEATH FARADRIVE STEERABLE (SHEATH) IMPLANT
SHEATH PINNACLE 8F 10CM (SHEATH) IMPLANT
SHEATH PINNACLE 9F 10CM (SHEATH) IMPLANT
SHEATH PROBE COVER 6X72 (BAG) IMPLANT

## 2023-10-06 NOTE — Discharge Instructions (Addendum)
Stop amiodarone in 1 month.  Cardiac Ablation, Care After  This sheet gives you information about how to care for yourself after your procedure. Your health care provider may also give you more specific instructions. If you have problems or questions, contact your health care provider. What can I expect after the procedure? After the procedure, it is common to have: Bruising around your puncture site. Tenderness around your puncture site. Skipped heartbeats. If you had an atrial fibrillation ablation, you may have atrial fibrillation during the first several months after your procedure.  Tiredness (fatigue).  Follow these instructions at home: Puncture site care  Follow instructions from your health care provider about how to take care of your puncture site. Make sure you: If present, leave stitches (sutures), skin glue, or adhesive strips in place. These skin closures may need to stay in place for up to 2 weeks. If adhesive strip edges start to loosen and curl up, you may trim the loose edges. Do not remove adhesive strips completely unless your health care provider tells you to do that. If a large square bandage is present, this may be removed 24 hours after surgery.  Check your puncture site every day for signs of infection. Check for: Redness, swelling, or pain. Fluid or blood. If your puncture site starts to bleed, lie down on your back, apply firm pressure to the area, and contact your health care provider. Warmth. Pus or a bad smell. A pea or marble sized lump/knot at the site is normal and can take up to three months to resolve.  Driving Do not drive for at least 4 days after your procedure or however long your health care provider recommends. (Do not resume driving if you have previously been instructed not to drive for other health reasons.) Do not drive or use heavy machinery while taking prescription pain medicine. Activity Avoid activities that take a lot of effort for at  least 7 days after your procedure. Do not lift anything that is heavier than 5 lb (4.5 kg) for one week.  No sexual activity for 1 week.  Return to your normal activities as told by your health care provider. Ask your health care provider what activities are safe for you. General instructions Take over-the-counter and prescription medicines only as told by your health care provider. Do not use any products that contain nicotine or tobacco, such as cigarettes and e-cigarettes. If you need help quitting, ask your health care provider. You may shower after 24 hours, but Do not take baths, swim, or use a hot tub for 1 week.  Do not drink alcohol for 24 hours after your procedure. Keep all follow-up visits as told by your health care provider. This is important. Contact a health care provider if: You have redness, mild swelling, or pain around your puncture site. You have fluid or blood coming from your puncture site that stops after applying firm pressure to the area. Your puncture site feels warm to the touch. You have pus or a bad smell coming from your puncture site. You have a fever. You have chest pain or discomfort that spreads to your neck, jaw, or arm. You have chest pain that is worse with lying on your back or taking a deep breath. You are sweating a lot. You feel nauseous. You have a fast or irregular heartbeat. You have shortness of breath. You are dizzy or light-headed and feel the need to lie down. You have pain or numbness in the arm or  leg closest to your puncture site. Get help right away if: Your puncture site suddenly swells. Your puncture site is bleeding and the bleeding does not stop after applying firm pressure to the area. These symptoms may represent a serious problem that is an emergency. Do not wait to see if the symptoms will go away. Get medical help right away. Call your local emergency services (911 in the [place] Do not drive yourself to the  hospital. Summary After the procedure, it is normal to have bruising and tenderness at the puncture site in your groin, neck, or forearm. Check your puncture site every day for signs of infection. Get help right away if your puncture site is bleeding and the bleeding does not stop after applying firm pressure to the area. This is a medical emergency. This information is not intended to replace advice given to you by your health care provider. Make sure you discuss any questions you have with your health care provider.

## 2023-10-06 NOTE — Anesthesia Postprocedure Evaluation (Signed)
Anesthesia Post Note  Patient: [name] Foster  Procedure(s) Performed: ATRIAL FIBRILLATION ABLATION     Patient location during evaluation: PACU Anesthesia Type: General Level of consciousness: awake and alert and oriented Pain management: pain level controlled Vital Signs Assessment: post-procedure vital signs reviewed and stable Respiratory status: spontaneous breathing, nonlabored ventilation and respiratory function stable Cardiovascular status: blood pressure returned to baseline and stable Postop Assessment: no apparent nausea or vomiting Anesthetic complications: no   No notable events documented.  Last Vitals:  Vitals:   10/06/23 1018 10/06/23 1030  BP: (!) 161/87 (!) 167/84  Pulse: 79 79  Resp: 12 12  Temp:  37.2 C  SpO2: 97% 96%    Last Pain:  Vitals:   10/06/23 1030  TempSrc: Oral  PainSc:                  [name]

## 2023-10-06 NOTE — H&P (Signed)
Electrophysiology Note:   Date:  10/06/2023  ID:  [name] Foster, DOB 03-17-1956, MRN [id number]   Primary Cardiologist: Tonny Bollman, MD Electrophysiologist: Nobie Putnam, MD       History of Present Illness:   [name] Foster is a 68 y.o. male with h/o CAD s/p multiple PCI's, T2DM, HTN, HLD, chronic kidney disease, OSA intolerant of CPAP, and persistent atrial fibrillation who is being seen today for evaluation of atrial fibrillation at the request of [place], [place].    Patient was diagnosed with atrial fibrillation in August 2024.  During his initial episode he felt diaphoretic, weak and with palpitations.  He went to see his primary care physician and was diagnosed with atrial fibrillation.  He was seen by Dr. Excell Seltzer and started on oral anticoagulation.  After 3 weeks of receiving oral anticoagulation he underwent cardioversion on 05/30/2023.  Cardioversion was successful: however, he presented back to the ED on 9/27 for atrial fibrillation with rapid ventricular response.  In the ED he converted spontaneously.  He was then seen in atrial fibrillation clinic and started on amiodarone.  Today, he presents to discuss catheter ablation.  He denies any new episodes of atrial fibrillation since his visit to A-fib clinic and starting amiodarone.  He states that he is very symptomatic when he has episodes of atrial fibrillation with palpitations, shortness of breath and fatigue.  INTERVAL: Patient presents for scheduled ablation. Doing well. No new or acute complaints. No missed doses of anti-coagulation.   Review of systems complete and found to be negative unless listed in HPI.    EP Information / Studies Reviewed:     EKG is not ordered today. EKG from 06/29/23 reviewed which showed normal sinus rhythm.        EKG 06/15/23:     Echo 05/24/2023: Normal LV size and function.  Mild LVH.  LVEF 55 to 60%. Normal RV size and function. No significant valvular disease.     Risk  Assessment/Calculations:     CHA2DS2-VASc Score = 4   This indicates a 4.8% annual risk of stroke. The patient's score is based upon: CHF History: 0 HTN History: 1 Diabetes History: 1 Stroke History: 0 Vascular Disease History: 1 Age Score: 1 Gender Score: 0               Physical Exam:    Today's Vitals   10/06/23 0607  BP: (!) 165/66  Pulse: 66  Resp: 16  Temp: 99.1 F (37.3 C)  Weight: 90.7 kg  Height: 5\' 9"  (1.753 m)  PainSc: 0-No pain   Body mass index is 29.53 kg/m.  GEN: Well nourished, well developed in no acute distress NECK: No JVD CARDIAC: Normal rate and regular rhythm. RESPIRATORY:  Clear to auscultation without rales, wheezing or rhonchi  ABDOMEN: Soft, non-tender, non-distended EXTREMITIES:  No edema; No deformity    ASSESSMENT AND PLAN:   [name] Foster is a 69 y.o. male with h/o CAD s/p multiple PCI's, T2DM, HTN, HLD, chronic kidney disease, OSA intolerant of CPAP, and persistent atrial fibrillation who is being seen today for evaluation of atrial fibrillation at the request of [place], [place].    # Persistent atrial fibrillation, symptomatic: -Continue amiodarone as a bridge to ablation. -Continue metoprolol. -Discussed treatment options today for AF including antiarrhythmic drug therapy and ablation. Discussed risks, recovery and likelihood of success with each treatment strategy. Risk, benefits, and alternatives to EP study and ablation for afib were discussed. These risks include  but are not limited to stroke, bleeding, vascular damage, tamponade, perforation, damage to the esophagus, lungs, phrenic nerve and other structures, pulmonary vein stenosis, worsening renal function, coronary vasospasm and death.  Discussed potential need for repeat ablation procedures and antiarrhythmic drugs after an initial ablation. The patient understands these risk and wishes to proceed with ablation today.   # Secondary hypercoagulable state due to atrial  fibrillation: -CHA2DS2-VASc score of 4 -Continue Eliquis    Signed, Nobie Putnam, MD

## 2023-10-06 NOTE — Anesthesia Procedure Notes (Signed)
Procedure Name: Intubation Date/Time: 10/06/2023 7:48 AM  Performed by: Sharyn Dross, CRNAPre-anesthesia Checklist: Patient identified, Emergency Drugs available, Suction available and Patient being monitored Patient Re-evaluated:Patient Re-evaluated prior to induction Oxygen Delivery Method: Circle system utilized Preoxygenation: Pre-oxygenation with 100% oxygen Induction Type: IV induction Ventilation: Mask ventilation without difficulty and Oral airway inserted - appropriate to patient size Laryngoscope Size: Mac and 4 Grade View: Grade I Tube type: Oral Tube size: 7.5 mm Number of attempts: 1 Airway Equipment and Method: Stylet and Oral airway Placement Confirmation: ETT inserted through vocal cords under direct vision, positive ETCO2 and breath sounds checked- equal and bilateral Secured at: 22 cm Tube secured with: Tape Dental Injury: Teeth and Oropharynx as per pre-operative assessment

## 2023-10-06 NOTE — Transfer of Care (Signed)
Immediate Anesthesia Transfer of Care Note  Patient: [name] Foster  Procedure(s) Performed: ATRIAL FIBRILLATION ABLATION  Patient Location: PACU  Anesthesia Type:General  Level of Consciousness: awake, alert , and oriented  Airway & Oxygen Therapy: Patient Spontanous Breathing and Patient connected to nasal cannula oxygen  Post-op Assessment: Report given to RN and Post -op Vital signs reviewed and stable  Post vital signs: Reviewed and stable  Last Vitals:  Vitals Value Taken Time  BP 135/78 10/06/23 0956  Temp    Pulse 77 10/06/23 0958  Resp 20 10/06/23 0958  SpO2 93 % 10/06/23 0958  Vitals shown include unfiled device data.  Last Pain:  Vitals:   10/06/23 0607  PainSc: 0-No pain         Complications: No notable events documented.

## 2023-10-06 NOTE — Progress Notes (Signed)
Patient walked to the bathroom without difficulties. Bilateral groins level 0, clean, dry, and intact.

## 2023-10-07 ENCOUNTER — Encounter (HOSPITAL_COMMUNITY): Payer: Self-pay | Admitting: Cardiology

## 2023-10-10 MED FILL — Fentanyl Citrate Preservative Free (PF) Inj 100 MCG/2ML: INTRAMUSCULAR | Qty: 2 | Status: AC

## 2023-10-10 MED FILL — Cefazolin Sodium-Dextrose IV Solution 2 GM/100ML-4%: INTRAVENOUS | Qty: 100 | Status: AC

## 2023-10-17 ENCOUNTER — Ambulatory Visit (HOSPITAL_COMMUNITY): Payer: Medicare Other | Admitting: Internal Medicine

## 2023-10-27 ENCOUNTER — Other Ambulatory Visit: Payer: Self-pay | Admitting: Family Medicine

## 2023-10-27 DIAGNOSIS — I152 Hypertension secondary to endocrine disorders: Secondary | ICD-10-CM

## 2023-11-02 ENCOUNTER — Ambulatory Visit: Payer: Medicare Other | Admitting: Cardiovascular Disease

## 2023-11-03 ENCOUNTER — Ambulatory Visit (HOSPITAL_COMMUNITY): Payer: Medicare Other | Admitting: Internal Medicine

## 2023-11-24 ENCOUNTER — Ambulatory Visit (HOSPITAL_COMMUNITY): Payer: Medicare Other | Admitting: Internal Medicine

## 2023-11-24 ENCOUNTER — Ambulatory Visit: Payer: Medicare Other | Admitting: Physician Assistant

## 2023-12-01 ENCOUNTER — Telehealth (HOSPITAL_COMMUNITY): Payer: Self-pay | Admitting: Internal Medicine

## 2023-12-01 NOTE — Telephone Encounter (Signed)
 Patient called and cancelled appt for 12/02/23, said he admitted himself into rehab and said he didn't if he would be there 30 or 45 days, but he will call and reschedule once he's out.

## 2023-12-02 ENCOUNTER — Ambulatory Visit (HOSPITAL_COMMUNITY): Payer: Medicare Other | Admitting: Internal Medicine

## 2023-12-19 ENCOUNTER — Telehealth: Payer: Self-pay | Admitting: Family Medicine

## 2023-12-20 ENCOUNTER — Ambulatory Visit: Payer: Medicare Other | Admitting: Physician Assistant

## 2023-12-21 ENCOUNTER — Other Ambulatory Visit: Payer: Self-pay | Admitting: Family Medicine

## 2023-12-21 DIAGNOSIS — I252 Old myocardial infarction: Secondary | ICD-10-CM

## 2023-12-21 DIAGNOSIS — I152 Hypertension secondary to endocrine disorders: Secondary | ICD-10-CM

## 2023-12-21 DIAGNOSIS — I2510 Atherosclerotic heart disease of native coronary artery without angina pectoris: Secondary | ICD-10-CM

## 2023-12-25 IMAGING — DX DG ABDOMEN ACUTE W/ 1V CHEST
4 series · 4 of 4 positions shown · non-contrast
Comparison: CT abdomen dated 02/09/2022

CLINICAL DATA: Abdominal pain/distension after drinking oral
contrast for routine CT

EXAM:
DG ABDOMEN ACUTE WITH 1 VIEW CHEST

[chest pa]
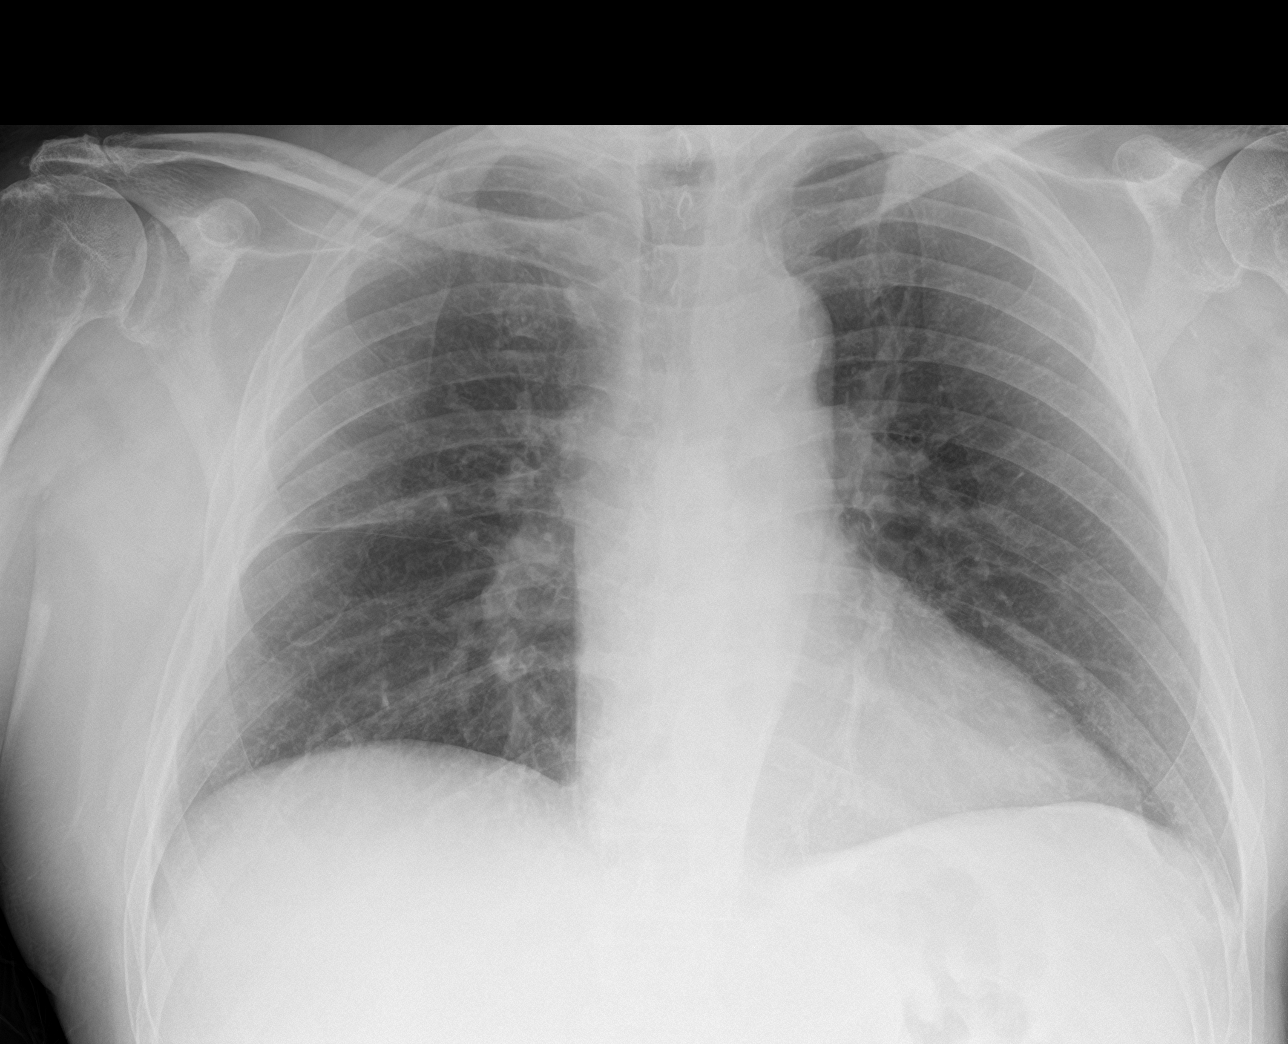

[abdomen erect]
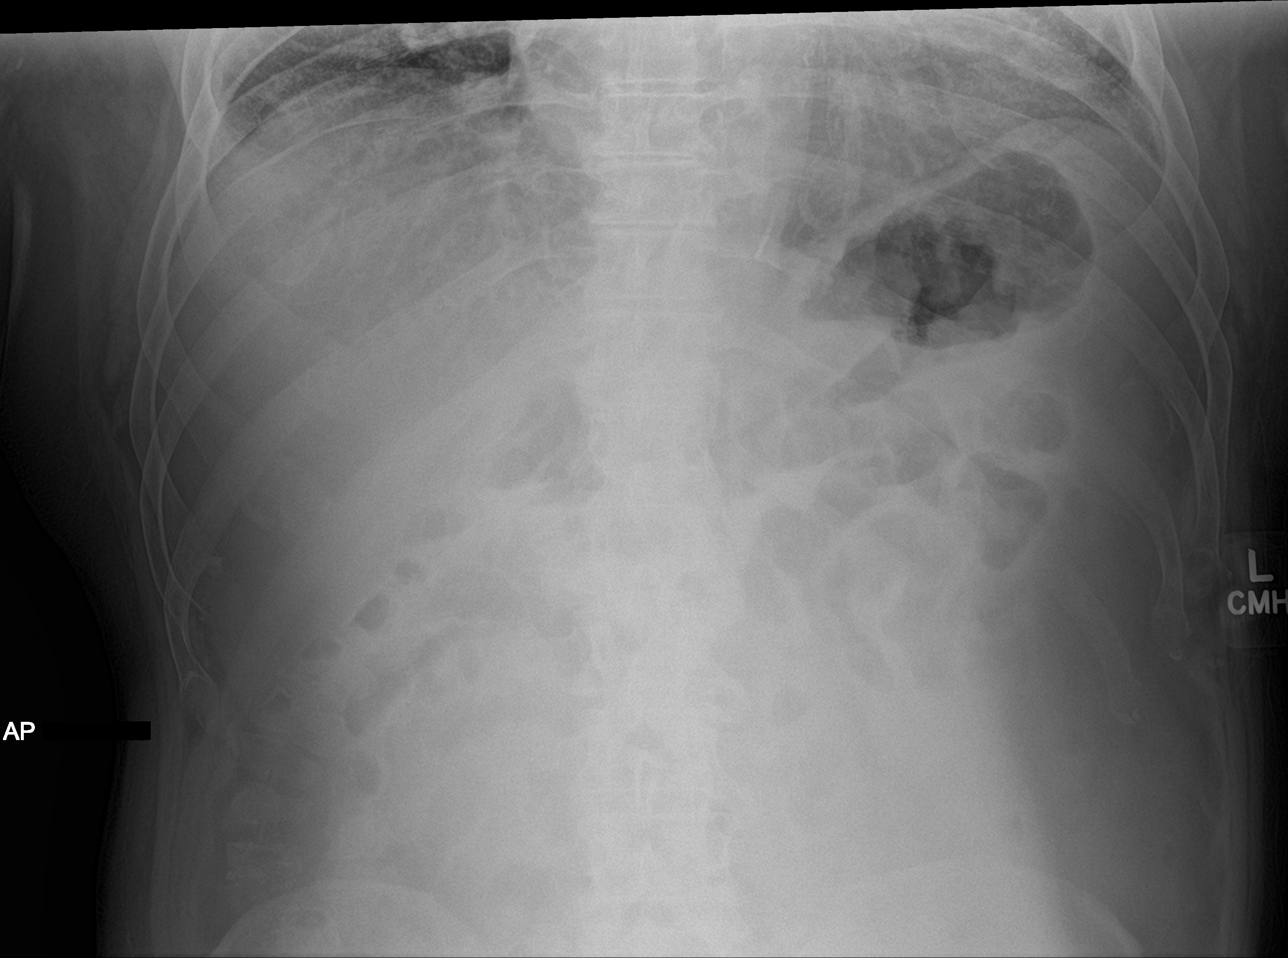

[abdomen supine (1 of 2)]
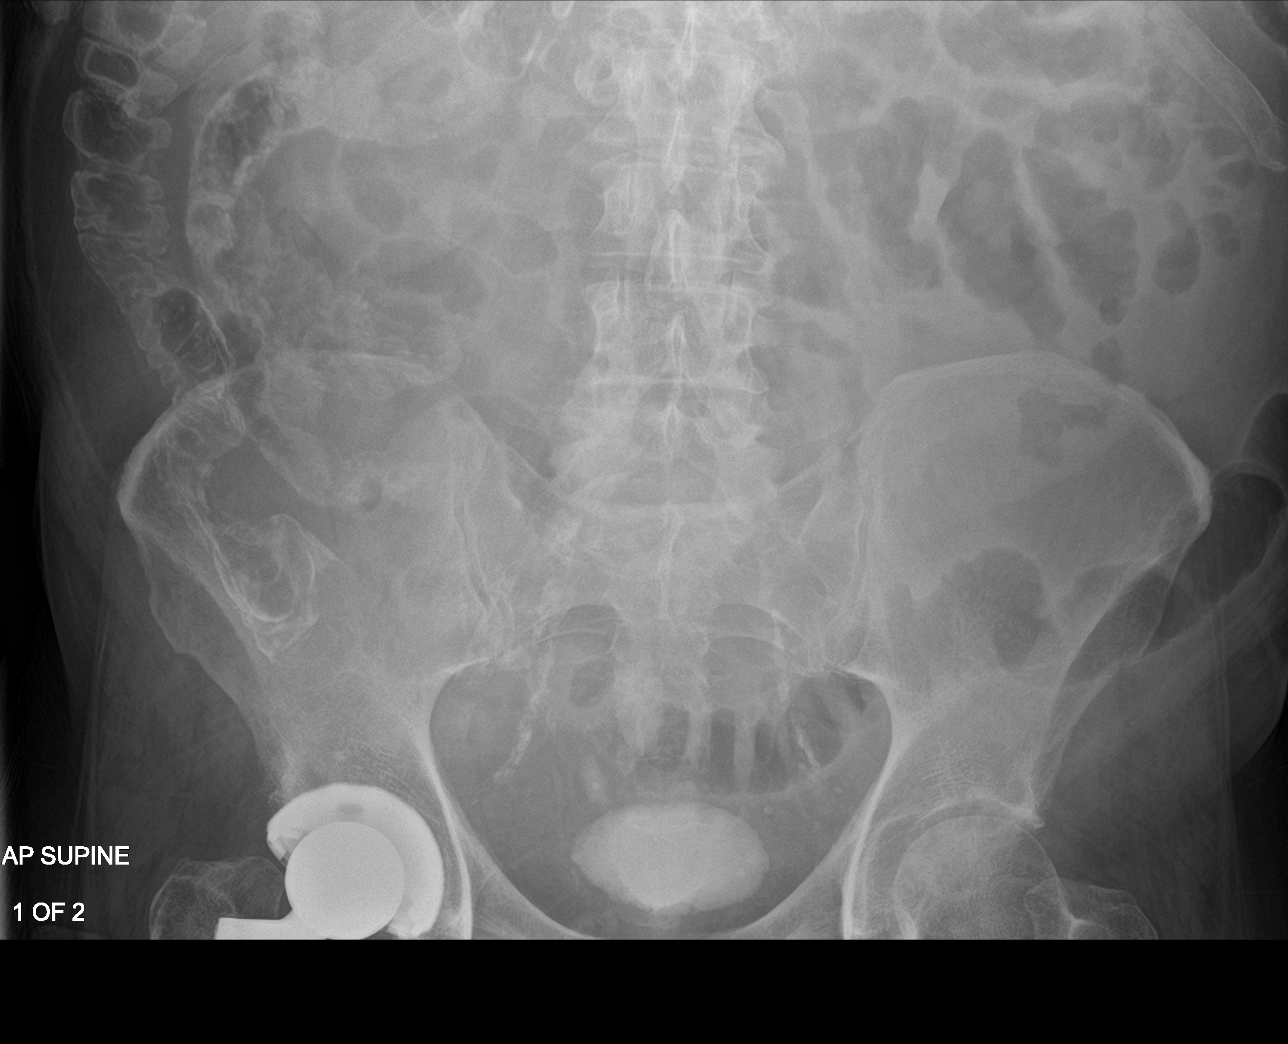

[abdomen supine (2 of 2)]
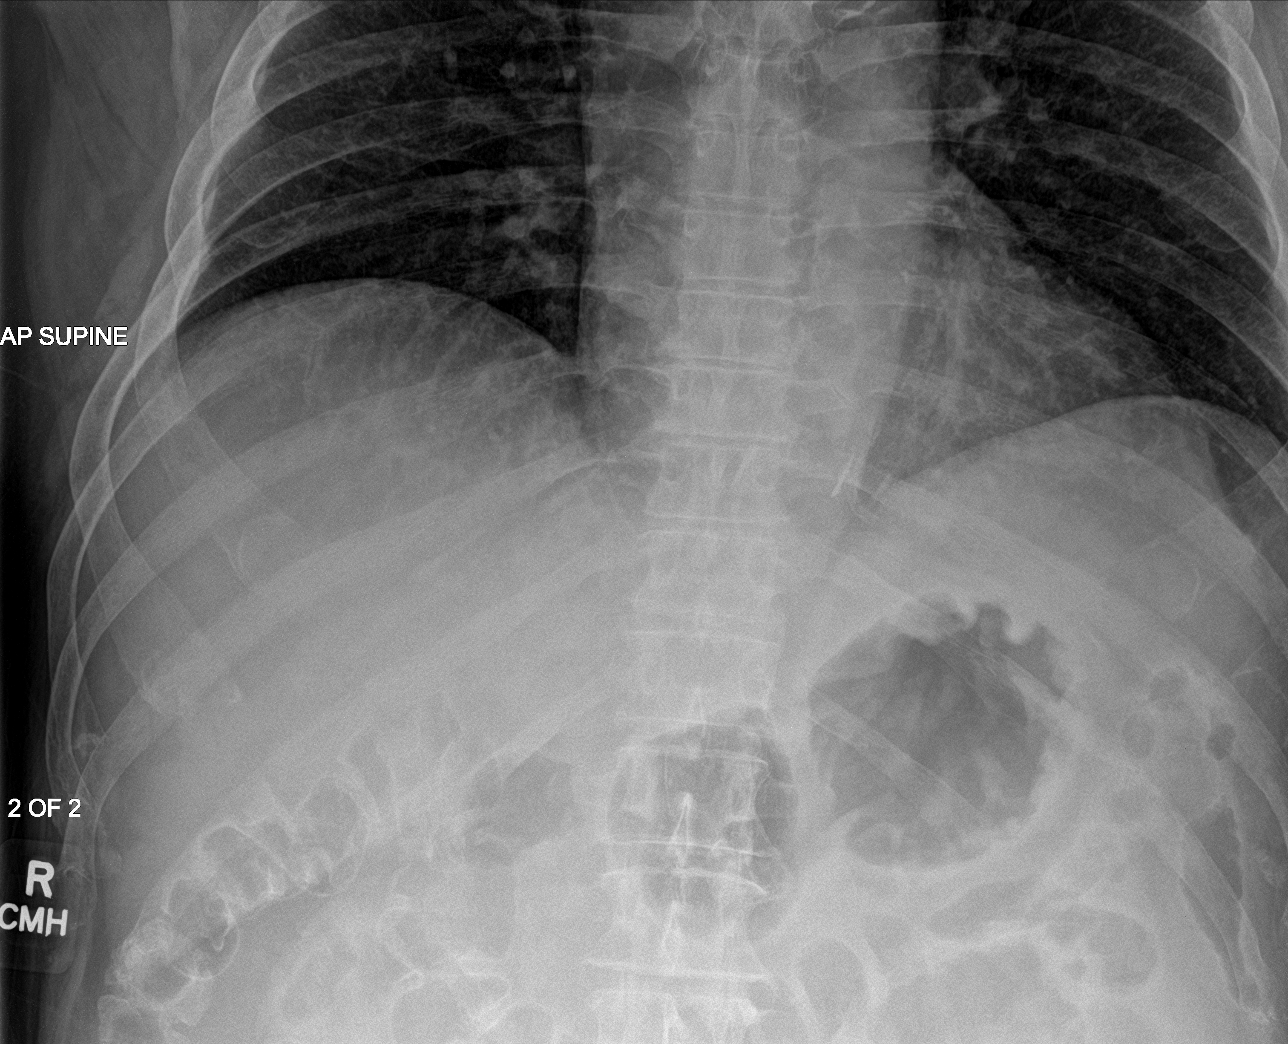

[4 of 4 positions shown; findings below may reference images not displayed]

FINDINGS: Mild platelike scarring/atelectasis in the right mid lung. Left lung
is clear. No pleural effusion or pneumothorax.

The heart is normal in size.

Nonobstructive bowel gas pattern.

No evidence of free air under the diaphragm on the upright view.

Right hip arthroplasty.

Excretory contrast in the bladder.
IMPRESSION: Negative abdominal radiographs.  No acute cardiopulmonary disease.

## 2024-01-11 ENCOUNTER — Ambulatory Visit: Payer: Medicare Other | Admitting: Physician Assistant

## 2024-01-17 ENCOUNTER — Ambulatory Visit: Attending: Cardiology | Admitting: Cardiology

## 2024-01-17 VITALS — BP 150/80 | HR 63 | Resp 16 | Ht 69.0 in | Wt 186.0 lb

## 2024-01-17 DIAGNOSIS — I10 Essential (primary) hypertension: Secondary | ICD-10-CM

## 2024-01-17 DIAGNOSIS — D6869 Other thrombophilia: Secondary | ICD-10-CM

## 2024-01-17 DIAGNOSIS — I4819 Other persistent atrial fibrillation: Secondary | ICD-10-CM

## 2024-01-17 DIAGNOSIS — I2510 Atherosclerotic heart disease of native coronary artery without angina pectoris: Secondary | ICD-10-CM

## 2024-01-17 NOTE — Patient Instructions (Signed)
 Medication Instructions:  Your physician has recommended you make the following change in your medication:  1) STOP taking amiodarone   *If you need a refill on your cardiac medications before your next appointment, please call your pharmacy*  Follow-Up: At [facility], you and your health needs are our priority.  As part of our continuing mission to provide you with exceptional heart care, our providers are all part of one team.  This team includes your primary Cardiologist (physician) and Advanced Practice Providers or APPs (Physician Assistants and Nurse Practitioners) who all work together to provide you with the care you need, when you need it.  Your next appointment:   4 monhts  Provider:   You will see one of the following Advanced Practice Providers on your designated Care Team:   Mertha Abrahams, Kennard Pea [address] [place], PA-C Suzann Riddle, NP Creighton Doffing, NP

## 2024-01-17 NOTE — Progress Notes (Unsigned)
 Electrophysiology Office Note:   Date:  01/18/2024  ID:  [name] Foster, DOB December 04, 1955, MRN [id number]  Primary Cardiologist: Arnoldo Lapping, MD Electrophysiologist: Ardeen Kohler, MD      History of Present Illness:   [name] Foster is a 68 y.o. male with h/o CAD s/p multiple PCI's, T2DM, HTN, HLD, chronic kidney disease, OSA intolerant of CPAP, and persistent atrial fibrillation who is being seen today for follow up after catheter ablation.   Discussed the use of AI scribe software for clinical note transcription with the patient, who gave verbal consent to proceed.  History of Present Illness Patient underwent PVI and posterior wall ablation on 10/06/23. He has felt well since the ablation with no episodes of atrial fibrillation. No palpitations, chest pain, or shortness of breath have been experienced. He is still taking amiodarone . He was unable to attend his 1 month post op appt when this was scheduled to be discontinued. He is also on Eliquis  and inquires about the possibility of discontinuing it in the future. Doing well with no new or acute complaints. No trouble breathing, heart racing, flip-flopping, fluttering, or chest pain.  Review of systems complete and found to be negative unless listed in HPI.   EP Information / Studies Reviewed:    EKG is ordered today. Personal review as below.  EKG Interpretation Date/Time:  Tuesday Jan 17 2024 16:09:51 EDT Ventricular Rate:  61 PR Interval:  158 QRS Duration:  116 QT Interval:  472 QTC Calculation: 475 R Axis:   98  Text Interpretation: Normal sinus rhythm Rightward axis When compared with ECG of 06-Oct-2023 10:18, No significant change was found Confirmed by Ardeen Kohler [phone]) on 01/17/2024 4:20:02 PM   Echo 05/24/2023: Normal LV size and function.  Mild LVH.  LVEF 55 to 60%. Normal RV size and function. No significant valvular disease.  Risk Assessment/Calculations:    CHA2DS2-VASc Score = 4   This indicates a 4.8%  annual risk of stroke. The patient's score is based upon: CHF History: 0 HTN History: 1 Diabetes History: 1 Stroke History: 0 Vascular Disease History: 1 Age Score: 1 Gender Score: 0      Physical Exam:   VS:  BP (!) 150/80 (BP Location: Left Arm, Patient Position: Sitting, Cuff Size: Normal)   Pulse 63   Resp 16   Ht 5\' 9"  (1.753 m)   Wt 186 lb (84.4 kg)   SpO2 90%   BMI 27.47 kg/m    Wt Readings from Last 3 Encounters:  01/17/24 186 lb (84.4 kg)  10/06/23 200 lb (90.7 kg)  08/03/23 200 lb (90.7 kg)     GEN: Well nourished, well developed in no acute distress NECK: No JVD CARDIAC: Normal rate, regular rhythm RESPIRATORY:  Clear to auscultation without rales, wheezing or rhonchi  ABDOMEN: Soft, non-distended EXTREMITIES:  No edema; No deformity   ASSESSMENT AND PLAN:    # Persistent atrial fibrillation, symptomatic: S/p ablation on 10/06/23. No known recurrence. Plan was to stop amiodarone  1 month after ablation but patient did not make it to his 1 month follow up visit and has continued taking.  -Stop amiodarone .  -Continue metoprolol  50mg  BID.   # Secondary hypercoagulable state due to atrial fibrillation: CHA2DS2-VASc score of 4 -Continue Eliquis  5mg  BID for now.  -Patient inquiring about stopping Eliquis . Would like to see if he has recurrence off amiodarone  before doing so. We will have him return in 4 months to reassess. If he has not had recurrence by that  visit then we will discuss long term monitoring options to stop anti-coagulation vs Watchman implant if recurrence.    # Coronary artery disease status post multiple PCI's, including most recently orbital atherectomy and stenting of the mid LAD on 07/03/2018: No chest pain. -Continue Plavix  65mg  dialy and follow-up with interventional cardiology.  #Hypertension -Above goal today.  Recommend checking blood pressures 1-2 times per week at home and recording the values.  Recommend bringing these recordings to the  primary care physician.  Follow up with Dr. Daneil Dunker  in 4 months.   Signed, Ardeen Kohler, MD

## 2024-02-19 ENCOUNTER — Other Ambulatory Visit: Payer: Self-pay | Admitting: Family Medicine

## 2024-02-19 DIAGNOSIS — I252 Old myocardial infarction: Secondary | ICD-10-CM

## 2024-02-19 DIAGNOSIS — I152 Hypertension secondary to endocrine disorders: Secondary | ICD-10-CM

## 2024-02-19 DIAGNOSIS — I2510 Atherosclerotic heart disease of native coronary artery without angina pectoris: Secondary | ICD-10-CM

## 2024-02-20 MED ORDER — ISOSORBIDE MONONITRATE ER 30 MG PO TB24: ORAL_TABLET | ORAL | 0 refills | Status: DC

## 2024-02-20 NOTE — Telephone Encounter (Signed)
 Appt 02-27-2024 with [name]

## 2024-02-20 NOTE — Telephone Encounter (Signed)
 [name] pt NTBS 30-d given 12/22/23

## 2024-02-20 NOTE — Addendum Note (Signed)
 Addended by: Linzee Depaul D on: 02/20/2024 10:41 AM   Modules accepted: Orders

## 2024-02-27 ENCOUNTER — Encounter: Payer: Self-pay | Admitting: Family Medicine

## 2024-02-27 ENCOUNTER — Ambulatory Visit (INDEPENDENT_AMBULATORY_CARE_PROVIDER_SITE_OTHER): Admitting: Family Medicine

## 2024-02-27 VITALS — BP 170/85 | HR 66 | Temp 97.9°F | Ht 69.0 in | Wt 178.6 lb

## 2024-02-27 DIAGNOSIS — E1159 Type 2 diabetes mellitus with other circulatory complications: Secondary | ICD-10-CM

## 2024-02-27 DIAGNOSIS — E1169 Type 2 diabetes mellitus with other specified complication: Secondary | ICD-10-CM | POA: Diagnosis not present

## 2024-02-27 DIAGNOSIS — E1165 Type 2 diabetes mellitus with hyperglycemia: Secondary | ICD-10-CM | POA: Diagnosis not present

## 2024-02-27 DIAGNOSIS — E785 Hyperlipidemia, unspecified: Secondary | ICD-10-CM

## 2024-02-27 DIAGNOSIS — E1129 Type 2 diabetes mellitus with other diabetic kidney complication: Secondary | ICD-10-CM

## 2024-02-27 DIAGNOSIS — I700 Atherosclerosis of aorta: Secondary | ICD-10-CM

## 2024-02-27 DIAGNOSIS — I2510 Atherosclerotic heart disease of native coronary artery without angina pectoris: Secondary | ICD-10-CM | POA: Diagnosis not present

## 2024-02-27 DIAGNOSIS — F5101 Primary insomnia: Secondary | ICD-10-CM

## 2024-02-27 DIAGNOSIS — E1121 Type 2 diabetes mellitus with diabetic nephropathy: Secondary | ICD-10-CM

## 2024-02-27 DIAGNOSIS — Z7901 Long term (current) use of anticoagulants: Secondary | ICD-10-CM

## 2024-02-27 DIAGNOSIS — I4819 Other persistent atrial fibrillation: Secondary | ICD-10-CM

## 2024-02-27 DIAGNOSIS — N1831 Chronic kidney disease, stage 3a: Secondary | ICD-10-CM

## 2024-02-27 LAB — BAYER DCA HB A1C WAIVED: HB A1C (BAYER DCA - WAIVED): 5.5 % (ref 4.8–5.6)

## 2024-02-27 MED ORDER — EZETIMIBE 10 MG PO TABS: 10.0000 mg | ORAL_TABLET | Freq: Every day | ORAL | 3 refills | Status: AC

## 2024-02-27 MED ORDER — EMPAGLIFLOZIN 25 MG PO TABS: 25.0000 mg | ORAL_TABLET | Freq: Every day | ORAL | 3 refills | Status: DC

## 2024-02-27 MED ORDER — DULOXETINE HCL 30 MG PO CPEP
30.0000 mg | ORAL_CAPSULE | Freq: Every day | ORAL | 3 refills | Status: AC
Start: 2024-02-27 — End: ?

## 2024-02-27 MED ORDER — AMLODIPINE BESYLATE 5 MG PO TABS: 5.0000 mg | ORAL_TABLET | Freq: Every day | ORAL | 1 refills | Status: DC

## 2024-02-27 MED ORDER — MIRTAZAPINE 15 MG PO TABS: 15.0000 mg | ORAL_TABLET | Freq: Every day | ORAL | 3 refills | Status: AC

## 2024-02-27 NOTE — Addendum Note (Signed)
 Addended by: Albertha Huger on: 02/27/2024 02:27 PM   Modules accepted: Level of Service

## 2024-02-27 NOTE — Progress Notes (Signed)
 Established Patient Office Visit  Subjective   Patient ID: [name] Foster, male    DOB: 1956-06-15  Age: 68 y.o. MRN: [id number]  Chief Complaint  Patient presents with   Medical Management of Chronic Issues    HPI DM Pt presents for follow up evaluation of Type 2 diabetes mellitus.  Patient denies hypoglycemia , increased appetite, nausea, polydipsia, polyuria, visual disturbances, vomiting, and weight loss.  Current diabetic medications include farixga. Requesting to switch to jardiance  as this is less costly   Current monitoring regimen: no Any episodes of hypoglycemia? no  Is He on ACE inhibitor or angiotensin II receptor blocker?  No Is He on statin? Yes   2. HTN Complaint with meds - didn't take medication today but reports compliance otherwise Current Medications - amlodipine , hydralazine , HCTZ, metoprolol , lasix  BP at home: missed cuff Pertinent ROS:  Headache - No Fatigue - No Visual Disturbances - No Chest pain - No Dyspnea - No Palpitations - No LE edema - sometimes dependent edema  Has seen cardiology for persistent A. Fib. Now s/p ablation. No a.fib since. Recently stopped amiodarone . Will follow up with cardiology in a few months and discuss possibility of discontinuing eliquis .   3. HLD On statin and zetia .   4. CKD Has not followed up with nephrology. Difficulty affording co-pays.   5. Insomnia Sleep is fair with Remeron . Recently completed rehab for alcohol .      02/27/2024    1:11 PM 08/03/2023   10:46 AM 05/12/2023    2:53 PM  Depression screen PHQ 2/9  Decreased Interest 1 0 0  Down, Depressed, Hopeless 1 0 0  PHQ - 2 Score 2 0 0  Altered sleeping 2 2 0  Tired, decreased energy 1 1 0  Change in appetite 0 0 0  Feeling bad or failure about yourself  0 0 0  Trouble concentrating 0 0 0  Moving slowly or fidgety/restless 0 0 0  Suicidal thoughts 0 0 0  PHQ-9 Score 5 3 0  Difficult doing work/chores Not difficult at all Not difficult at  all Not difficult at all      02/27/2024    1:11 PM 08/03/2023   10:46 AM 05/02/2023    9:50 AM 01/28/2023   10:37 AM  GAD 7 : Generalized Anxiety Score  Nervous, Anxious, on Edge 0 0 0 0  Control/stop worrying 0 0 0 0  Worry too much - different things 0 0 0 0  Trouble relaxing 1 0 0 0  Restless 0 0 0 0  Easily annoyed or irritable 0 0 0 0  Afraid - awful might happen 0 0 0 0  Total GAD 7 Score 1 0 0 0  Anxiety Difficulty Not difficult at all Not difficult at all Not difficult at all Not difficult at all     Past Medical History:  Diagnosis Date   Arthritis    CAD (coronary artery disease)    a. 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 DES to left PDA 09/20/17 // c. 10/19: LCx and LPDA stents ok; s/p DES to pLAD and POBA to D1   Diabetes mellitus without complication (HCC)    type 2    Diabetic nephropathy (HCC)    Echocardiogram 08/2021    Echocardiogram 12/22: EF 60-65, no RWMA, GLS -19.4% (normal), normal RVSF, trivial MR, mild  dilation of ascending aorta (37 mm)   Heart attack (HCC)     a blood clot is actually what caused my heart attack in 2018  , he states he is unsure where the blood clot originated from    Hyperlipidemia    Hypertension    Insomnia    Pulmonary hypertension (HCC)    a. TTE 03/25/17; EF 60-65%, nl WM, LV diastolic fxn nl, trivial AI, RV cavity size, wall thickness, and sys fxn nl, trivial TR, PASP 39 mmHg, trivial pericardial effusion // Echo 12/22: normal RVSP, PASP   SCCA (squamous cell carcinoma) of skin 03/26/2020   Mid Parietal Scalp (well diff)   Sleep apnea    could not use a cpap   Vitamin B12 deficiency 05/03/2020   Wears dentures    full top-partial bottom   Wears glasses       ROS As per HPI.    Objective:     BP (!) 170/85   Pulse 66   Temp 97.9 F (36.6 C) (Temporal)   Ht 5' 9 (1.753 m)   Wt 178 lb  9.6 oz (81 kg)   SpO2 94%   BMI 26.37 kg/m  BP Readings from Last 3 Encounters:  02/27/24 (!) 170/85  01/17/24 (!) 150/80  10/06/23 (!) 157/84   Wt Readings from Last 3 Encounters:  02/27/24 178 lb 9.6 oz (81 kg)  01/17/24 186 lb (84.4 kg)  10/06/23 200 lb (90.7 kg)      Physical Exam Vitals and nursing note reviewed.  Constitutional:      General: He is not in acute distress.    Appearance: He is not ill-appearing, toxic-appearing or diaphoretic.  HENT:     Nose: Nose normal.     Mouth/Throat:     Mouth: Mucous membranes are moist.     Pharynx: Oropharynx is clear.   Eyes:     General:        Right eye: No discharge.        Left eye: No discharge.     Extraocular Movements: Extraocular movements intact.     Conjunctiva/sclera: Conjunctivae normal.     Pupils: Pupils are equal, round, and reactive to light.   Neck:     Thyroid : No thyroid  mass, thyromegaly or thyroid  tenderness.     Vascular: No carotid bruit.   Cardiovascular:     Rate and Rhythm: Normal rate and regular rhythm.     Heart sounds: Normal heart sounds. No murmur heard. Pulmonary:     Effort: Pulmonary effort is normal.     Breath sounds: Normal breath sounds.  Abdominal:     General: Bowel sounds are normal. There is no distension.     Palpations: Abdomen is soft.     Tenderness: There is no abdominal tenderness. There is no guarding or rebound.   Musculoskeletal:     Right lower leg: No edema.     Left lower leg: No edema.   Skin:    General: Skin is warm and dry.   Neurological:     General: No focal deficit present.     Mental Status: He is alert and oriented to person, place, and time.   Psychiatric:        Mood and Affect: Mood normal.        Behavior: Behavior normal.      No results found for any visits on 02/27/24.  Last CBC Lab Results  Component Value Date   WBC 5.0 10/06/2023  HGB 13.9 10/06/2023   HCT 43.4 10/06/2023   MCV 92.7 10/06/2023   MCH 29.7 10/06/2023    RDW 14.6 10/06/2023   PLT 116 (L) 10/06/2023   Last metabolic panel Lab Results  Component Value Date   GLUCOSE 139 (H) 10/06/2023   NA 134 (L) 10/06/2023   K 3.7 10/06/2023   CL 98 10/06/2023   CO2 25 10/06/2023   BUN 19 10/06/2023   CREATININE 1.59 (H) 10/06/2023   GFRNONAA 47 (L) 10/06/2023   CALCIUM  9.1 10/06/2023   PHOS 2.5 02/10/2022   PROT 6.7 03/23/2023   ALBUMIN 4.3 03/23/2023   LABGLOB 2.2 10/29/2022   AGRATIO 1.8 10/29/2022   BILITOT 0.2 03/23/2023   ALKPHOS 76 03/23/2023   AST 21 03/23/2023   ALT 14 03/23/2023   ANIONGAP 11 10/06/2023   Last lipids Lab Results  Component Value Date   CHOL 119 03/23/2023   HDL 51 03/23/2023   LDLCALC 47 03/23/2023   TRIG 117 03/23/2023   CHOLHDL 2.3 03/23/2023   Last vitamin B12 and Folate Lab Results  Component Value Date   VITAMINB12 252 02/11/2022   FOLATE 5.5 (L) 02/11/2022      The ASCVD Risk score (Arnett DK, et al., 2019) failed to calculate for the following reasons:   Risk score cannot be calculated because patient has a medical history suggesting prior/existing ASCVD    Assessment & Plan:   [name] Foster was seen today for medical management of chronic issues.  Diagnoses and all orders for this visit:  Type 2 diabetes mellitus with hyperglycemia, without long-term current use of insulin  (HCC) A1c 5.5, at goal of <7. Will switch from farxiga  to jardiance  as this is less costly for patient. ARB previously d/c due to AKI. On statin.  -     Bayer DCA Hb A1c Waived -     CBC with Differential/Platelet -     empagliflozin  (JARDIANCE ) 25 MG TABS tablet; Take 1 tablet (25 mg total) by mouth daily before breakfast.  Hyperlipidemia associated with type 2 diabetes mellitus (HCC) Last LDL 47. On statin and zetia .  -     ezetimibe  (ZETIA ) 10 MG tablet; Take 1 tablet (10 mg total) by mouth daily.  Hypertension associated with diabetes (HCC) BP elevated. Denies symptoms. Didn't take medication today. Discussed to  monitor BP at home and notify for elevated readings.  -     amLODipine  (NORVASC ) 5 MG tablet; Take 1 tablet (5 mg total) by mouth daily.  Microalbuminuria due to type 2 diabetes mellitus (HCC) On SGLT2. Over due for follow up with nephrology.   Stage 3a chronic kidney disease (HCC) Avoid NSAIDs. Labs pending.  -     CMP14+EGFR -     Microalbumin / creatinine urine ratio  Diabetic nephropathy associated with type 2 diabetes mellitus (HCC) -     DULoxetine  (CYMBALTA ) 30 MG capsule; Take 1 capsule (30 mg total) by mouth daily.  Persistent atrial fibrillation (HCC) Chronic anticoagulation Continue eliquis  until follow up with cardiology. RRR today.   Coronary artery disease involving native coronary artery of native heart without angina pectoris Atherosclerosis of aorta (HCC) On statin. Continue plavix .   Primary insomnia Continue remeron .  -     mirtazapine  (REMERON ) 15 MG tablet; Take 1 tablet (15 mg total) by mouth at bedtime.  Return in about 6 months (around 08/28/2024) for chronic follow up with fasting labs.   The patient indicates understanding of these issues and agrees with the plan.  Soua Lenk M  Britta Candy, FNP

## 2024-02-28 LAB — CBC WITH DIFFERENTIAL/PLATELET
Basophils Absolute: 0 10*3/uL (ref 0.0–0.2)
Basos: 1 %
EOS (ABSOLUTE): 0.1 10*3/uL (ref 0.0–0.4)
Eos: 2 %
Hematocrit: 45.2 % (ref 37.5–51.0)
Hemoglobin: 13.6 g/dL (ref 13.0–17.7)
Immature Grans (Abs): 0 10*3/uL (ref 0.0–0.1)
Immature Granulocytes: 0 %
Lymphocytes Absolute: 0.7 10*3/uL (ref 0.7–3.1)
Lymphs: 14 %
MCH: 26.5 pg — ABNORMAL LOW (ref 26.6–33.0)
MCHC: 30.1 g/dL — ABNORMAL LOW (ref 31.5–35.7)
MCV: 88 fL (ref 79–97)
Monocytes Absolute: 0.4 10*3/uL (ref 0.1–0.9)
Monocytes: 9 %
Neutrophils Absolute: 3.5 10*3/uL (ref 1.4–7.0)
Neutrophils: 73 %
Platelets: 170 10*3/uL (ref 150–450)
RBC: 5.13 x10E6/uL (ref 4.14–5.80)
RDW: 14.6 % (ref 11.6–15.4)
WBC: 4.8 10*3/uL (ref 3.4–10.8)

## 2024-02-28 LAB — CMP14+EGFR
ALT: 12 IU/L (ref 0–44)
AST: 21 IU/L (ref 0–40)
Albumin: 3.6 g/dL — ABNORMAL LOW (ref 3.9–4.9)
Alkaline Phosphatase: 90 IU/L (ref 44–121)
BUN/Creatinine Ratio: 7 — ABNORMAL LOW (ref 10–24)
BUN: 11 mg/dL (ref 8–27)
Bilirubin Total: 0.3 mg/dL (ref 0.0–1.2)
CO2: 23 mmol/L (ref 20–29)
Calcium: 8.9 mg/dL (ref 8.6–10.2)
Chloride: 101 mmol/L (ref 96–106)
Creatinine, Ser: 1.48 mg/dL — ABNORMAL HIGH (ref 0.76–1.27)
Globulin, Total: 2.4 g/dL (ref 1.5–4.5)
Glucose: 97 mg/dL (ref 70–99)
Potassium: 4.5 mmol/L (ref 3.5–5.2)
Sodium: 139 mmol/L (ref 134–144)
Total Protein: 6 g/dL (ref 6.0–8.5)
eGFR: 51 mL/min/{1.73_m2} — ABNORMAL LOW (ref >59)

## 2024-02-29 LAB — MICROALBUMIN / CREATININE URINE RATIO
Creatinine, Urine: 97.9 mg/dL
Microalb/Creat Ratio: 5391 mg/g{creat} — ABNORMAL HIGH (ref 0–29)
Microalbumin, Urine: 5277.8 ug/mL

## 2024-03-01 ENCOUNTER — Ambulatory Visit: Payer: Self-pay | Admitting: Family Medicine

## 2024-03-01 MED ORDER — LOSARTAN POTASSIUM 25 MG PO TABS: 25.0000 mg | ORAL_TABLET | Freq: Every day | ORAL | 1 refills | Status: DC

## 2024-03-15 ENCOUNTER — Ambulatory Visit: Admitting: Family Medicine

## 2024-03-18 ENCOUNTER — Other Ambulatory Visit (HOSPITAL_COMMUNITY): Payer: Self-pay | Admitting: Internal Medicine

## 2024-03-29 ENCOUNTER — Other Ambulatory Visit: Payer: Self-pay | Admitting: Family Medicine

## 2024-03-29 DIAGNOSIS — I152 Hypertension secondary to endocrine disorders: Secondary | ICD-10-CM

## 2024-03-29 DIAGNOSIS — I2510 Atherosclerotic heart disease of native coronary artery without angina pectoris: Secondary | ICD-10-CM

## 2024-03-29 DIAGNOSIS — I252 Old myocardial infarction: Secondary | ICD-10-CM

## 2024-03-29 NOTE — Telephone Encounter (Signed)
 LMOVM RF request from [facility] in [place] please call back if this is correct

## 2024-03-29 NOTE — Telephone Encounter (Signed)
 Talked w/ pt request from [facility] in [place] is correct, refill sent.

## 2024-04-05 ENCOUNTER — Telehealth: Payer: Self-pay

## 2024-04-05 NOTE — Telephone Encounter (Signed)
   Pre-operative Risk Assessment    Patient Name: [name] Foster  DOB: 1956-09-07 MRN: [id number]   Date of last office visit: 01/17/24 FONDA KITTY, MD Date of next office visit: 05/21/24 CHARLIES ARTHUR, PA   Request for Surgical Clearance    Procedure:  BIOPSY  Date of Surgery:  Clearance TBD                                Surgeon: NOT INDICATED Surgeon's Group or Practice Name:  [facility] Phone number:  [phone] Fax number:  [phone]   Type of Clearance Requested:   - Medical  - Pharmacy:  Hold Clopidogrel  (Plavix ) and Apixaban  (Eliquis )     Type of Anesthesia:  Not Indicated   Additional requests/questions:    Signed, Lucie DELENA Ku   04/05/2024, 4:45 PM

## 2024-04-06 NOTE — Telephone Encounter (Signed)
 Pharmacy please advise on holding Eliquis  prior to Biopsy scheduled for TBD. Thank you.

## 2024-04-09 NOTE — Telephone Encounter (Signed)
 Patient with diagnosis of afib on Eliquis  for anticoagulation.    Procedure: biopsy (kidney?) Date of procedure: TBD   CHA2DS2-VASc Score = 4   This indicates a 4.8% annual risk of stroke. The patient's score is based upon: CHF History: 0 HTN History: 1 Diabetes History: 1 Stroke History: 0 Vascular Disease History: 1 Age Score: 1 Gender Score: 0      CrCl 53 ml/min Platelet count 170  Patient underwent PVI and posterior wall ablation on 10/06/23   Patient has not had an Afib/aflutter ablation within the last 3 months or DCCV within the last 30 days  Per office protocol, patient can hold Eliquis  for 2 days prior to procedure.    **This guidance is not considered finalized until pre-operative APP has relayed final recommendations.**

## 2024-04-10 NOTE — Telephone Encounter (Signed)
 1st attempt to reach pt regarding surgical clearance and the need for an TELE appointment.  Left pt a detailed message to call back and get that scheduled.

## 2024-04-10 NOTE — Telephone Encounter (Signed)
 Chart reviewed.  Last PCI procedure in 2019.  Patient at low risk of holding clopidogrel  5 to 7 days as needed before surgery.  Thank you

## 2024-04-10 NOTE — Telephone Encounter (Signed)
 Dr. Wonda,   Patient is pending kidney biopsy. He has a history of multiple PCIs including DES 2.25 x 28 mm to LPDA. He is on Plavix  and Eliquis . Per office protocol, will you please provide recommendations for holding Plavix  prior to kidney biopsy?  Please route your response to P [facility] Preop. I will communicate with requesting office once you have given recommendations.   Thank you!  Barnie Hila, NP

## 2024-04-10 NOTE — Telephone Encounter (Signed)
   Name: [name] Foster  DOB: 07/26/1956  MRN: [id number]  Primary Cardiologist: Ozell Fell, MD   Preoperative team, please contact this patient and set up a phone call appointment for further preoperative risk assessment. Please obtain consent and complete medication review. Thank you for your help.  I confirm that guidance regarding antiplatelet and oral anticoagulation therapy has been completed and, if necessary, noted below.  Per office protocol and Dr. Fell, he may hold Plavix  for 5-7 days prior to procedure and should resume as soon as hemodynamically stable postoperatively.   I also confirmed the patient resides in the state of [place] . As per [facility] Medical Board telemedicine laws, the patient must reside in the state in which the provider is licensed.    Barnie Hila, NP 04/10/2024, 4:14 PM [facility]

## 2024-04-12 ENCOUNTER — Telehealth: Payer: Self-pay

## 2024-04-12 NOTE — Telephone Encounter (Signed)
Patient has been scheduled for televisit.

## 2024-04-12 NOTE — Telephone Encounter (Signed)
 Patient has been scheduled for televisit med rec and consent done     Patient Consent for Virtual Visit         [name] Foster has provided verbal consent on 04/12/2024 for a virtual visit (video or telephone).   CONSENT FOR VIRTUAL VISIT FOR:  [name] Foster  By participating in this virtual visit I agree to the following:  I hereby voluntarily request, consent and authorize [facility] and its employed or contracted physicians, physician assistants, nurse practitioners or other licensed health care professionals (the Practitioner), to provide me with telemedicine health care services (the "Services) as deemed necessary by the treating Practitioner. I acknowledge and consent to receive the Services by the Practitioner via telemedicine. I understand that the telemedicine visit will involve communicating with the Practitioner through live audiovisual communication technology and the disclosure of certain medical information by electronic transmission. I acknowledge that I have been given the opportunity to request an in-person assessment or other available alternative prior to the telemedicine visit and am voluntarily participating in the telemedicine visit.  I understand that I have the right to withhold or withdraw my consent to the use of telemedicine in the course of my care at any time, without affecting my right to future care or treatment, and that the Practitioner or I may terminate the telemedicine visit at any time. I understand that I have the right to inspect all information obtained and/or recorded in the course of the telemedicine visit and may receive copies of available information for a reasonable fee.  I understand that some of the potential risks of receiving the Services via telemedicine include:  Delay or interruption in medical evaluation due to technological equipment failure or disruption; Information transmitted may not be sufficient (e.g. poor resolution of  images) to allow for appropriate medical decision making by the Practitioner; and/or  In rare instances, security protocols could fail, causing a breach of personal health information.  Furthermore, I acknowledge that it is my responsibility to provide information about my medical history, conditions and care that is complete and accurate to the best of my ability. I acknowledge that Practitioner's advice, recommendations, and/or decision may be based on factors not within their control, such as incomplete or inaccurate data provided by me or distortions of diagnostic images or specimens that may result from electronic transmissions. I understand that the practice of medicine is not an exact science and that Practitioner makes no warranties or guarantees regarding treatment outcomes. I acknowledge that a copy of this consent can be made available to me via my patient portal [facility] MyChart), or I can request a printed copy by calling the office of [facility].    I understand that my insurance will be billed for this visit.   I have read or had this consent read to me. I understand the contents of this consent, which adequately explains the benefits and risks of the Services being provided via telemedicine.  I have been provided ample opportunity to ask questions regarding this consent and the Services and have had my questions answered to my satisfaction. I give my informed consent for the services to be provided through the use of telemedicine in my medical care

## 2024-04-17 ENCOUNTER — Ambulatory Visit: Attending: Cardiovascular Disease

## 2024-04-17 DIAGNOSIS — Z01810 Encounter for preprocedural cardiovascular examination: Secondary | ICD-10-CM | POA: Diagnosis not present

## 2024-04-17 NOTE — Progress Notes (Signed)
 Virtual Visit via Telephone Note   Because of [name] Foster co-morbid illnesses, he is at least at moderate risk for complications without adequate follow up.  This format is felt to be most appropriate for this patient at this time.  Due to technical limitations with video connection (technology), today's appointment will be conducted as an audio only telehealth visit, and CARLETON VANVALKENBURGH verbally agreed to proceed in this manner.   All issues noted in this document were discussed and addressed.  No physical exam could be performed with this format.  Evaluation Performed:  Preoperative cardiovascular risk assessment _____________   Date:  04/17/2024   Patient ID:  [name] Foster, DOB 20-Sep-1955, MRN [id number] Patient Location:  Home Provider location:   Office  Primary Care Provider:  Joesph Annabella HERO, FNP Primary Cardiologist:  Ozell Fell, MD  Chief Complaint / Patient Profile   68 y.o. y/o male with a h/o persistent atrial fibrillation, coronary artery disease, hypertension who is pending biopsy and presents today for telephonic preoperative cardiovascular risk assessment.  History of Present Illness    [name] Foster is a 68 y.o. male who presents via audio/video conferencing for a telehealth visit today.  Pt was last seen in cardiology clinic on 01/17/2024 by Dr. Kennyth.  At that time LANSON RANDLE was doing well .  The patient is now pending procedure as outlined above. Since his last visit, he continues to be stable from a cardiac standpoint.  Today he denies chest pain, shortness of breath, lower extremity edema, fatigue, palpitations, melena, hematuria, hemoptysis, diaphoresis, weakness, presyncope, syncope, orthopnea, and PND.   Past Medical History    Past Medical History:  Diagnosis Date   Arthritis    CAD (coronary artery disease)    a. inferoposterior STEMI 03/25/17: LHC LM calcified w/o stenosis, pLAD 50%, m-dLCx 99% s/p PCI/DES w/ Promus 3.5 x 24 mm  DES, ostial OM1 50%, small RCA with mRCA 50%; b. DES to left PDA 09/20/17 // c. 10/19: LCx and LPDA stents ok; s/p DES to pLAD and POBA to D1   Diabetes mellitus without complication (HCC)    type 2    Diabetic nephropathy (HCC)    Echocardiogram 08/2021    Echocardiogram 12/22: EF 60-65, no RWMA, GLS -19.4% (normal), normal RVSF, trivial MR, mild dilation of ascending aorta (37 mm)   Heart attack (HCC)     a blood clot is actually what caused my heart attack in 2018  , he states he is unsure where the blood clot originated from    Hyperlipidemia    Hypertension    Insomnia    Pulmonary hypertension (HCC)    a. TTE 03/25/17; EF 60-65%, nl WM, LV diastolic fxn nl, trivial AI, RV cavity size, wall thickness, and sys fxn nl, trivial TR, PASP 39 mmHg, trivial pericardial effusion // Echo 12/22: normal RVSP, PASP   SCCA (squamous cell carcinoma) of skin 03/26/2020   Mid Parietal Scalp (well diff)   Sleep apnea    could not use a cpap   Vitamin B12 deficiency 05/03/2020   Wears dentures    full top-partial bottom   Wears glasses    Past Surgical History:  Procedure Laterality Date   ATRIAL FIBRILLATION ABLATION N/A 10/06/2023   Procedure: ATRIAL FIBRILLATION ABLATION;  Surgeon: Kennyth Chew, MD;  Location: [facility];  Service: Cardiovascular;  Laterality: N/A;   CARDIOVERSION N/A 05/30/2023   Procedure: CARDIOVERSION;  Surgeon: Delford Maude BROCKS, MD;  Location:  [facility];  Service: Cardiovascular;  Laterality: N/A;   COLONOSCOPY  05/13/2010   Single anal papilla, 2 diminutive polyps at 10 cm, left-sided diverticula, one sigmoid and one a sending polyp, otherwise normal colon and TI.  Path with 1 tubular adenoma, otherwise hyperplastic polyps.  Recommendations repeat colonoscopy in 7 years.   COLONOSCOPY WITH PROPOFOL  N/A 11/22/2019   Rourk, [name] HERO, MD -  ten 4-9 mm polyps in the rectum, sigmoid colon and cecum were removed, three 12-25 mm polyps at the splenic flexure and  hepatic flexure removed with clip placement to the largest polyp   COLONOSCOPY WITH PROPOFOL  N/A 01/14/2022   Procedure: COLONOSCOPY WITH PROPOFOL ;  Surgeon: Shaaron [name] HERO, MD;  Location: [facility];  Service: Endoscopy;  Laterality: N/A;  11:15am, asa 3   CORONARY ATHERECTOMY N/A 07/03/2018   Procedure: CORONARY ATHERECTOMY;  Surgeon: Verlin Lonni BIRCH, MD;  Location: [facility];  Service: Cardiovascular;  Laterality: N/A;   CORONARY PRESSURE/FFR STUDY N/A 09/20/2017   Procedure: INTRAVASCULAR PRESSURE WIRE/FFR STUDY;  Surgeon: Anner Alm ORN, MD;  Location: [facility];  Service: Cardiovascular;  Laterality: N/A;   CORONARY PRESSURE/FFR STUDY N/A 06/30/2018   Procedure: INTRAVASCULAR PRESSURE WIRE/FFR STUDY;  Surgeon: Mady Lonni, MD;  Location: [facility];  Service: Cardiovascular;  Laterality: N/A;   CORONARY STENT INTERVENTION N/A 09/20/2017   Procedure: CORONARY STENT INTERVENTION;  Surgeon: Anner Alm ORN, MD;  Location: [facility];  Service: Cardiovascular;  Laterality: N/A;   CORONARY STENT INTERVENTION N/A 07/03/2018   Procedure: CORONARY STENT INTERVENTION;  Surgeon: Verlin Lonni BIRCH, MD;  Location: [facility];  Service: Cardiovascular;  Laterality: N/A;   CORONARY/GRAFT ACUTE MI REVASCULARIZATION N/A 03/25/2017   Procedure: Coronary/Graft Acute MI Revascularization;  Surgeon: Wonda Sharper, MD;  Location: [facility] INVAS[facility] CV [facility];  Service: Cardiovascular;  Laterality: N/A;   GANGLION CYST EXCISION Left 07/25/2014   Procedure: EXCISION VOLAR AND DORSAL GANGLION LEFT WRIST;  Surgeon: Franky Curia, MD;  Location: [facility];  Service: Orthopedics;  Laterality: Left;   KNEE ARTHROSCOPY     rifgr and left   LEFT HEART CATH AND CORONARY ANGIOGRAPHY N/A 03/25/2017   Procedure: Left Heart Cath and Coronary Angiography;  Surgeon: Wonda Sharper, MD;  Location: [facility] INVASIVE CV LAB;  Service: Cardiovascular;  Laterality:  N/A;   LEFT HEART CATH AND CORONARY ANGIOGRAPHY N/A 09/20/2017   Procedure: LEFT HEART CATH AND CORONARY ANGIOGRAPHY;  Surgeon: Anner Alm ORN, MD;  Location: [facility] INVASIVE CV LAB;  Service: Cardiovascular;  Laterality: N/A;   LEFT HEART CATH AND CORONARY ANGIOGRAPHY N/A 06/30/2018   Procedure: LEFT HEART CATH AND CORONARY ANGIOGRAPHY;  Surgeon: Mady Lonni, MD;  Location: [facility];  Service: Cardiovascular;  Laterality: N/A;   POLYPECTOMY  11/22/2019   Procedure: POLYPECTOMY;  Surgeon: Shaaron [name] HERO, MD;  Location: [facility];  Service: Endoscopy;;   POLYPECTOMY  01/14/2022   Procedure: POLYPECTOMY;  Surgeon: Shaaron [name] HERO, MD;  Location: [facility];  Service: Endoscopy;;   SHOULDER ARTHROSCOPY  2012   left   SHOULDER ARTHROSCOPY     right   TEMPORARY PACEMAKER N/A 03/25/2017   Procedure: Temporary Pacemaker;  Surgeon: Wonda Sharper, MD;  Location: [facility] INVASIVE CV LAB;  Service: Cardiovascular;  Laterality: N/A;   TONSILLECTOMY     TOTAL HIP ARTHROPLASTY Right 04/25/2019   TOTAL HIP ARTHROPLASTY Right 04/25/2019   Procedure: TOTAL HIP ARTHROPLASTY ANTERIOR APPROACH;  Surgeon:  Fidel Rogue, MD;  Location: [facility];  Service: Orthopedics;  Laterality: Right;   TOTAL KNEE ARTHROPLASTY  2002   left    Allergies  No Known Allergies  Home Medications    Prior to Admission medications   Medication Sig Start Date End Date Taking? Authorizing Provider  albuterol  (VENTOLIN  HFA) 108 (90 Base) MCG/ACT inhaler Inhale 1-2 puffs into the lungs every 4 (four) hours as needed for wheezing or shortness of breath. 08/03/23   Joesph Annabella HERO, FNP  amLODipine  (NORVASC ) 5 MG tablet Take 1 tablet (5 mg total) by mouth daily. 02/27/24   Joesph Annabella HERO, FNP  apixaban  (ELIQUIS ) 5 MG TABS tablet Take 1 tablet (5 mg total) by mouth 2 (two) times daily. 05/02/23   Joesph Annabella HERO, FNP  atorvastatin  (LIPITOR ) 40 MG tablet Take 1 tablet (40 mg total) by mouth daily at 6 PM. 05/02/23    Joesph Annabella HERO, FNP  b complex vitamins capsule Take 1 capsule by mouth daily.    [provider]  Cholecalciferol (VITAMIN D ) 50 MCG (2000 UT) tablet Take 2,000 Units by mouth daily.    [provider]  clopidogrel  (PLAVIX ) 75 MG tablet Take 1 tablet (75 mg total) by mouth daily. 05/02/23   Joesph Annabella HERO, FNP  Coenzyme Q10 (COQ-10) 100 MG CAPS Take 100 mg by mouth daily.    [provider]  DULoxetine  (CYMBALTA ) 30 MG capsule Take 1 capsule (30 mg total) by mouth daily. 02/27/24   Joesph Annabella HERO, FNP  empagliflozin  (JARDIANCE ) 25 MG TABS tablet Take 1 tablet (25 mg total) by mouth daily before breakfast. 02/27/24   Joesph Annabella HERO, FNP  ezetimibe  (ZETIA ) 10 MG tablet Take 1 tablet (10 mg total) by mouth daily. 02/27/24   Joesph Annabella HERO, FNP  furosemide  (LASIX ) 20 MG tablet Take 20 mg by mouth daily as needed for edema.    [provider]  hydrALAZINE  (APRESOLINE ) 50 MG tablet Take 1 tablet (50 mg total) by mouth every 8 (eight) hours. 05/02/23   Joesph Annabella HERO, FNP  hydrochlorothiazide  (HYDRODIURIL ) 12.5 MG tablet Take 1 tablet (12.5 mg total) by mouth daily as needed (Swelling in ankles). (NEEDS TO BE SEEN BEFORE NEXT REFILL) 08/03/23   Joesph Annabella HERO, FNP  isosorbide  mononitrate (IMDUR ) 30 MG 24 hr tablet TAKE 1/2 OF A TABLET (15 MG TOTAL) BY MOUTH DAILY 03/29/24   Joesph Annabella HERO, FNP  losartan  (COZAAR ) 25 MG tablet Take 1 tablet (25 mg total) by mouth daily. 03/01/24   Joesph Annabella HERO, FNP  metoprolol  tartrate (LOPRESSOR ) 50 MG tablet Take 1 tablet (50 mg total) by mouth 2 (two) times daily. 05/02/23   Joesph Annabella HERO, FNP  mirtazapine  (REMERON ) 15 MG tablet Take 1 tablet (15 mg total) by mouth at bedtime. 02/27/24   Joesph Annabella HERO, FNP  naltrexone (DEPADE) 50 MG tablet Take 50 mg by mouth daily. 01/02/24   [provider]  nitroGLYCERIN  (NITROSTAT ) 0.4 MG SL tablet DISSOLVE 1 TAB UNDER TONGUE AT ONSET OF CHEST PAIN. REPEAT EVERY  5 MINUTES FOR 2 DOSES AS NEEDED 08/03/23   Joesph Annabella HERO, FNP  RESTASIS  0.05 % ophthalmic emulsion Place 1 drop into both eyes 2 (two) times daily. 04/21/20   [provider]  simethicone (MYLICON) 125 MG chewable tablet Chew 125 mg by mouth every 6 (six) hours as needed for flatulence.    [provider]  tamsulosin  (FLOMAX ) 0.4 MG CAPS capsule Take 1 capsule (0.4 mg total)  by mouth daily. 05/02/23   Joesph Annabella HERO, FNP    Physical Exam    Vital Signs:  [name] Foster does not have vital signs available for review today.  Given telephonic nature of communication, physical exam is limited. AAOx3. NAD. Normal affect.  Speech and respirations are unlabored.  Accessory Clinical Findings    None  Assessment & Plan    1.  Preoperative Cardiovascular Risk Assessment:  BIOPSY   Date of Surgery:  Clearance TBD                                  Surgeon: NOT INDICATED Surgeon's Group or Practice Name:  [facility] Phone number:  [phone] Fax number:  [phone]   Type of Clearance Requested:   - Medical  - Pharmacy:  Hold Clopidogrel  (Plavix ) and Apixaban  (Eliquis )      Primary Cardiologist: Ozell Fell, MD  Chart reviewed as part of pre-operative protocol coverage. Given past medical history and time since last visit, based on ACC/AHA guidelines, RAEQUON CATANZARO would be at acceptable risk for the planned procedure without further cardiovascular testing.   Patient was advised that if he develops new symptoms prior to surgery to contact our office to arrange a follow-up appointment.  He verbalized understanding.  Per office protocol and Dr. Fell, he may hold Plavix  for 5-7 days prior to procedure and should resume as soon as hemodynamically stable postoperatively. Per office protocol, patient can hold Eliquis  for 2 days prior to procedure.   I will route this recommendation to the requesting party via Epic fax function and  remove from pre-op  pool.       Time:   Today, I have spent 5 minutes with the patient with telehealth technology discussing medical history, symptoms, and management plan.  I spent 10 minutes reviewing patient's past cardiac history and cardiac medications.    Josefa HERO [place], NP  04/17/2024, 7:37 AM

## 2024-04-27 ENCOUNTER — Other Ambulatory Visit: Payer: Self-pay | Admitting: Family Medicine

## 2024-04-27 DIAGNOSIS — E1159 Type 2 diabetes mellitus with other circulatory complications: Secondary | ICD-10-CM

## 2024-04-27 DIAGNOSIS — I2510 Atherosclerotic heart disease of native coronary artery without angina pectoris: Secondary | ICD-10-CM

## 2024-04-27 DIAGNOSIS — I252 Old myocardial infarction: Secondary | ICD-10-CM

## 2024-04-30 ENCOUNTER — Other Ambulatory Visit (HOSPITAL_COMMUNITY): Payer: Self-pay | Admitting: Nurse Practitioner

## 2024-04-30 DIAGNOSIS — R809 Proteinuria, unspecified: Secondary | ICD-10-CM

## 2024-05-07 ENCOUNTER — Telehealth: Payer: Self-pay | Admitting: Family Medicine

## 2024-05-07 NOTE — Telephone Encounter (Signed)
 Copied from CRM #[id number]. Topic: General - Other >> May 07, 2024 10:23 AM Nathanel BROCKS wrote: Reason for CRM:   Jacquline With [facility], [phone]  She is with [facility] and is needing pts diagnosis, please return call. [phone] ID for call

## 2024-05-07 NOTE — Telephone Encounter (Signed)
 I spoke with Olam at [facility] and they have already received confirmation, will close encounter.

## 2024-05-15 ENCOUNTER — Encounter: Payer: Self-pay | Admitting: Nephrology

## 2024-05-15 ENCOUNTER — Ambulatory Visit: Payer: Medicare Other

## 2024-05-15 VITALS — BP 170/85 | HR 66 | Ht 69.0 in | Wt 178.0 lb

## 2024-05-15 DIAGNOSIS — Z122 Encounter for screening for malignant neoplasm of respiratory organs: Secondary | ICD-10-CM

## 2024-05-15 DIAGNOSIS — Z0001 Encounter for general adult medical examination with abnormal findings: Secondary | ICD-10-CM | POA: Diagnosis not present

## 2024-05-15 DIAGNOSIS — N1831 Chronic kidney disease, stage 3a: Secondary | ICD-10-CM | POA: Diagnosis not present

## 2024-05-15 DIAGNOSIS — E1165 Type 2 diabetes mellitus with hyperglycemia: Secondary | ICD-10-CM | POA: Diagnosis not present

## 2024-05-15 DIAGNOSIS — Z0000 Encounter for general adult medical examination without abnormal findings: Secondary | ICD-10-CM

## 2024-05-15 DIAGNOSIS — Z139 Encounter for screening, unspecified: Secondary | ICD-10-CM

## 2024-05-15 NOTE — Progress Notes (Signed)
 Subjective:   [name] Foster is a 68 y.o. who presents for a Medicare Wellness preventive visit.  As a reminder, Annual Wellness Visits don't include a physical exam, and some assessments may be limited, especially if this visit is performed virtually. We may recommend an in-person follow-up visit with your provider if needed.  Visit Complete: Virtual I connected with  [name] Foster on 05/15/24 by a audio enabled telemedicine application and verified that I am speaking with the correct person using two identifiers.  Patient Location: Home  Provider Location: Home Office  I discussed the limitations of evaluation and management by telemedicine. The patient expressed understanding and agreed to proceed.  Vital Signs: Because this visit was a virtual/telehealth visit, some criteria may be missing or patient reported. Any vitals not documented were not able to be obtained and vitals that have been documented are patient reported.  VideoDeclined- This patient declined Librarian, academic. Therefore the visit was completed with audio only.  Persons Participating in Visit: Patient.  AWV Questionnaire: No: Patient Medicare AWV questionnaire was not completed prior to this visit.  Cardiac Risk Factors include: advanced age (>86men, >61 women);diabetes mellitus;dyslipidemia;hypertension;male gender;smoking/ tobacco exposure;Other (see comment), Risk factor comments: CAD     Objective:    Today's Vitals   05/15/24 1601  BP: (!) 170/85  Pulse: 66  Weight: 178 lb (80.7 kg)  Height: 5' 9 (1.753 m)   Body mass index is 26.29 kg/m.     05/15/2024   12:45 PM 10/06/2023    6:09 AM 06/10/2023    2:10 PM 05/12/2023    2:54 PM 05/10/2022    2:51 PM 02/12/2022   12:04 PM 02/10/2022    4:38 AM  Advanced Directives  Does Patient Have a Medical Advance Directive? No No No No No No No  Would patient like information on creating a medical advance directive?  No -  Patient declined No - Patient declined Yes (MAU/Ambulatory/Procedural Areas - Information given) No - Patient declined No - Patient declined No - Patient declined    Current Medications (verified) Outpatient Encounter Medications as of 05/15/2024  Medication Sig   albuterol  (VENTOLIN  HFA) 108 (90 Base) MCG/ACT inhaler Inhale 1-2 puffs into the lungs every 4 (four) hours as needed for wheezing or shortness of breath.   amLODipine  (NORVASC ) 5 MG tablet Take 1 tablet (5 mg total) by mouth daily.   apixaban  (ELIQUIS ) 5 MG TABS tablet Take 1 tablet (5 mg total) by mouth 2 (two) times daily.   atorvastatin  (LIPITOR ) 40 MG tablet Take 1 tablet (40 mg total) by mouth daily at 6 PM.   b complex vitamins capsule Take 1 capsule by mouth daily.   Cholecalciferol (VITAMIN D ) 50 MCG (2000 UT) tablet Take 2,000 Units by mouth daily.   clopidogrel  (PLAVIX ) 75 MG tablet Take 1 tablet (75 mg total) by mouth daily.   Coenzyme Q10 (COQ-10) 100 MG CAPS Take 100 mg by mouth daily.   DULoxetine  (CYMBALTA ) 30 MG capsule Take 1 capsule (30 mg total) by mouth daily.   empagliflozin  (JARDIANCE ) 25 MG TABS tablet Take 1 tablet (25 mg total) by mouth daily before breakfast.   ezetimibe  (ZETIA ) 10 MG tablet Take 1 tablet (10 mg total) by mouth daily.   furosemide  (LASIX ) 20 MG tablet Take 20 mg by mouth daily as needed for edema.   hydrALAZINE  (APRESOLINE ) 50 MG tablet Take 1 tablet (50 mg total) by mouth every 8 (eight) hours.   hydrochlorothiazide  (  HYDRODIURIL ) 12.5 MG tablet Take 1 tablet (12.5 mg total) by mouth daily as needed (Swelling in ankles). (NEEDS TO BE SEEN BEFORE NEXT REFILL)   isosorbide  mononitrate (IMDUR ) 30 MG 24 hr tablet TAKE 1/2 OF A TABLET (15 MG TOTAL) BY MOUTH DAILY   losartan  (COZAAR ) 25 MG tablet Take 1 tablet (25 mg total) by mouth daily.   metoprolol  tartrate (LOPRESSOR ) 50 MG tablet Take 1 tablet (50 mg total) by mouth 2 (two) times daily.   mirtazapine  (REMERON ) 15 MG tablet Take 1 tablet (15  mg total) by mouth at bedtime.   naltrexone (DEPADE) 50 MG tablet Take 50 mg by mouth daily.   nitroGLYCERIN  (NITROSTAT ) 0.4 MG SL tablet DISSOLVE 1 TAB UNDER TONGUE AT ONSET OF CHEST PAIN. REPEAT EVERY 5 MINUTES FOR 2 DOSES AS NEEDED   RESTASIS  0.05 % ophthalmic emulsion Place 1 drop into both eyes 2 (two) times daily.   simethicone (MYLICON) 125 MG chewable tablet Chew 125 mg by mouth every 6 (six) hours as needed for flatulence.   tamsulosin  (FLOMAX ) 0.4 MG CAPS capsule Take 1 capsule (0.4 mg total) by mouth daily.   No facility-administered encounter medications on file as of 05/15/2024.    Allergies (verified) Patient has no known allergies.   History: Past Medical History:  Diagnosis Date   Arthritis    CAD (coronary artery disease)    a. 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 DES to left PDA 09/20/17 // c. 10/19: LCx and LPDA stents ok; s/p DES to pLAD and POBA to D1   Diabetes mellitus without complication (HCC)    type 2    Diabetic nephropathy (HCC)    Echocardiogram 08/2021    Echocardiogram 12/22: EF 60-65, no RWMA, GLS -19.4% (normal), normal RVSF, trivial MR, mild dilation of ascending aorta (37 mm)   Heart attack (HCC)     a blood clot is actually what caused my heart attack in 2018  , he states he is unsure where the blood clot originated from    Hyperlipidemia    Hypertension    Insomnia    Pulmonary hypertension (HCC)    a. TTE 03/25/17; EF 60-65%, nl WM, LV diastolic fxn nl, trivial AI, RV cavity size, wall thickness, and sys fxn nl, trivial TR, PASP 39 mmHg, trivial pericardial effusion // Echo 12/22: normal RVSP, PASP   SCCA (squamous cell carcinoma) of skin 03/26/2020   Mid Parietal Scalp (well diff)   Sleep apnea    could not use a cpap   Vitamin B12 deficiency 05/03/2020   Wears dentures    full top-partial bottom   Wears glasses     Past Surgical History:  Procedure Laterality Date   ATRIAL FIBRILLATION ABLATION N/A 10/06/2023   Procedure: ATRIAL FIBRILLATION ABLATION;  Surgeon: Kennyth Chew, MD;  Location: [facility];  Service: Cardiovascular;  Laterality: N/A;   CARDIOVERSION N/A 05/30/2023   Procedure: CARDIOVERSION;  Surgeon: Delford Maude BROCKS, MD;  Location: [facility];  Service: Cardiovascular;  Laterality: N/A;   COLONOSCOPY  05/13/2010   Single anal papilla, 2 diminutive polyps at 10 cm, left-sided diverticula, one sigmoid and one a sending polyp, otherwise normal colon and TI.  Path with 1 tubular adenoma, otherwise hyperplastic polyps.  Recommendations repeat colonoscopy in 7 years.   COLONOSCOPY WITH PROPOFOL  N/A 11/22/2019   Shaaron Charleston  M, MD -  ten 4-9 mm polyps in the rectum, sigmoid colon and cecum were removed, three 12-25 mm polyps at the splenic flexure and hepatic flexure removed with clip placement to the largest polyp   COLONOSCOPY WITH PROPOFOL  N/A 01/14/2022   Procedure: COLONOSCOPY WITH PROPOFOL ;  Surgeon: Shaaron [name] HERO, MD;  [facility]: [facility];  Service: Endoscopy;  Laterality: N/A;  11:15am, asa 3   CORONARY ATHERECTOMY N/A 07/03/2018   Procedure: CORONARY ATHERECTOMY;  Surgeon: Verlin Lonni BIRCH, MD;  Location: [facility];  Service: Cardiovascular;  Laterality: N/A;   CORONARY PRESSURE/FFR STUDY N/A 09/20/2017   Procedure: INTRAVASCULAR PRESSURE WIRE/FFR STUDY;  Surgeon: Anner Alm ORN, MD;  Location: [facility] INVASIVE CV LAB;  Service: Cardiovascular;  Laterality: N/A;   CORONARY PRESSURE/FFR STUDY N/A 06/30/2018   Procedure: INTRAVASCULAR PRESSURE WIRE/FFR STUDY;  Surgeon: Mady Lonni, MD;  [facility]: [facility];  Service: Cardiovascular;  Laterality: N/A;   CORONARY STENT INTERVENTION N/A 09/20/2017   Procedure: CORONARY STENT INTERVENTION;  Surgeon: Anner Alm ORN, MD;  Location: [facility] INVASIVE CV LAB;  Service: Cardiovascular;  Laterality: N/A;    CORONARY STENT INTERVENTION N/A 07/03/2018   Procedure: CORONARY STENT INTERVENTION;  Surgeon: Verlin Lonni BIRCH, MD;  [facility]: [facility];  Service: Cardiovascular;  Laterality: N/A;   CORONARY/GRAFT ACUTE MI REVASCULARIZATION N/A 03/25/2017   Procedure: Coronary/Graft Acute MI Revascularization;  Surgeon: Wonda Sharper, MD;  Location: [facility] INVASIVE CV LAB;  Service: Cardiovascular;  Laterality: N/A;   GANGLION CYST EXCISION Left 07/25/2014   Procedure: EXCISION VOLAR AND DORSAL GANGLION LEFT WRIST;  Surgeon: Franky Curia, MD;  Location: [facility];  Service: Orthopedics;  Laterality: Left;   KNEE ARTHROSCOPY     rifgr and left   LEFT HEART CATH AND CORONARY ANGIOGRAPHY N/A 03/25/2017   Procedure: Left Heart Cath and Coronary Angiography;  Surgeon: Wonda Sharper, MD;  Location: [facility] INVASIVE CV LAB;  Service: Cardiovascular;  Laterality: N/A;   LEFT HEART CATH AND CORONARY ANGIOGRAPHY N/A 09/20/2017   Procedure: LEFT HEART CATH AND CORONARY ANGIOGRAPHY;  Surgeon: Anner Alm ORN, MD;  Location: [facility] INVASIVE CV LAB;  Service: Cardiovascular;  Laterality: N/A;   LEFT HEART CATH AND CORONARY ANGIOGRAPHY N/A 06/30/2018   Procedure: LEFT HEART CATH AND CORONARY ANGIOGRAPHY;  Surgeon: Mady Lonni, MD;  [facility]: [facility];  Service: Cardiovascular;  Laterality: N/A;   POLYPECTOMY  11/22/2019   Procedure: POLYPECTOMY;  Surgeon: Shaaron [name] HERO, MD;  [facility];  Service: Endoscopy;;   POLYPECTOMY  01/14/2022   Procedure: POLYPECTOMY;  Surgeon: Shaaron [name] HERO, MD;  [facility];  Service: Endoscopy;;   SHOULDER ARTHROSCOPY  2012   left   SHOULDER ARTHROSCOPY     right   TEMPORARY PACEMAKER N/A 03/25/2017   Procedure: Temporary Pacemaker;  Surgeon: Wonda Sharper, MD;  Location[facility] CV LAB;  Service: Cardiovascular;  Laterality: N/A;   TONSILLECTOMY     TOTAL HIP ARTHROPLASTY Right 04/25/2019   TOTAL HIP ARTHROPLASTY Right  04/25/2019   Procedure: TOTAL HIP ARTHROPLASTY ANTERIOR APPROACH;  Surgeon: Fidel Rogue, MD;  Location: [facility];  Service: Orthopedics;  Laterality: Right;   TOTAL KNEE ARTHROPLASTY  2002   left   Family History  Problem Relation Age of Onset   Hypertension Mother    Hypertension Father    Hypertension Brother    Heart disease Maternal Grandfather    Colon cancer Neg Hx    Social History  Socioeconomic History   Marital status: Widowed    Spouse name: Not on file   Number of children: 2   Years of education: Not on file   Highest education level: Not on file  Occupational History   Occupation: Retired  Tobacco Use   Smoking status: Former    Current packs/day: 0.00    Average packs/day: 2.0 packs/day for 30.0 years (60.0 ttl pk-yrs)    Types: Cigarettes    Start date: 03/26/1987    Quit date: 03/25/2017    Years since quitting: 7.1   Smokeless tobacco: Never  Vaping Use   Vaping status: Never Used  Substance and Sexual Activity   Alcohol  use: Yes    Comment: 3-6 shots of liquor daily   Drug use: Not Currently    Types: Marijuana    Comment: last used about 10 years ago (08/23/19)   Sexual activity: Not Currently  Other Topics Concern   Not on file  Social History Narrative   Per wife, pt was preparing to retire soon from his job at [employer].   13-May-2021 - wife passed away a couple weeks ago - he lives alone, but has lot of family nearby   Son next door, daughter lives in [name]   Social Drivers of Health   Financial Resource Strain: Medium Risk (05/15/2024)   Overall Financial Resource Strain (CARDIA)    Difficulty of Paying Living Expenses: Somewhat hard  Food Insecurity: No Food Insecurity (05/15/2024)   Hunger Vital Sign    Worried About Running Out of Food in the Last Year: Never true    Ran Out of Food in the Last Year: Never true  Transportation Needs: No Transportation Needs (05/15/2024)   PRAPARE - Administrator, Civil Service (Medical): No     Lack of Transportation (Non-Medical): No  Physical Activity: Insufficiently Active (05/15/2024)   Exercise Vital Sign    Days of Exercise per Week: 2 days    Minutes of Exercise per Session: 20 min  Stress: No Stress Concern Present (05/15/2024)   [employer] of Occupational Health - Occupational Stress Questionnaire    Feeling of Stress: Not at all  Social Connections: Socially Isolated (05/15/2024)   Social Connection and Isolation Panel    Frequency of Communication with Friends and Family: Once a week    Frequency of Social Gatherings with Friends and Family: Once a week    Attends Religious Services: Never    Database administrator or Organizations: No    Attends Banker Meetings: Never    Marital Status: Widowed    Tobacco Counseling Counseling given: Yes    Clinical Intake:  Pre-visit preparation completed: Yes  Pain : No/denies pain     BMI - recorded: 26.29 Nutritional Status: BMI 25 -29 Overweight Nutritional Risks: None Diabetes: Yes  Lab Results  Component Value Date   HGBA1C 5.5 02/27/2024   HGBA1C 5.5 08/03/2023   HGBA1C 6.3 (H) 05/02/2023     How often do you need to have someone help you when you read instructions, pamphlets, or other written materials from your doctor or pharmacy?: 1 - Never  Interpreter Needed?: No  Information entered by :: alia t/cma   Activities of Daily Living     05/15/2024   12:42 PM 05/30/2023    9:24 AM  In your present state of health, do you have any difficulty performing the following activities:  Hearing? 1 0  Vision? 0 0  Comment pt wear  glasses/pt goes to [facility] in [place]?last ov yr ago, will make an apt soon   Difficulty concentrating or making decisions? 0 0  Walking or climbing stairs? 0   Dressing or bathing? 0 0  Doing errands, shopping? 0   Preparing Food and eating ? N   Using the Toilet? N   In the past six months, have you accidently leaked urine? N   Do you have problems with  loss of bowel control? N   Managing your Medications? N   Managing your Finances? N   Housekeeping or managing your Housekeeping? N     Patient Care Team: Joesph Annabella HERO, FNP as PCP - General (Family Medicine) Wonda Sharper, MD as PCP - Cardiology (Cardiology) Kennyth Chew, MD as PCP - Electrophysiology (Cardiology) [facility] as Consulting Physician (Orthopedic Surgery) Livingston Rigg, MD as Consulting Physician (Dermatology) Lelon Glendia ONEIDA DEVONNA as Physician Assistant (Cardiology) Ladora Ross Lacy Phebe, MD as Referring Physician (Optometry) Billee Mliss BIRCH, [facility] as Pharmacist (Family Medicine)  I have updated your Care Teams any recent Medical Services you may have received from other providers in the past year.     Assessment:   This is a routine wellness examination for [place].  Hearing/Vision screen Hearing Screening - Comments:: Pt have some hearing loss Vision Screening - Comments:: pt wear glasses/pt goes to [facility] in [facility]?last ov yr ago, will make an apt soon   Goals Addressed   None    Depression Screen     05/15/2024   12:49 PM 02/27/2024    1:11 PM 08/03/2023   10:46 AM 05/12/2023    2:53 PM 05/02/2023    9:50 AM 01/28/2023   10:37 AM 10/29/2022   10:19 AM  PHQ 2/9 Scores  PHQ - 2 Score 1 2 0 0 0 0 0  PHQ- 9 Score 5 5 3  0 3 2 3     Fall Risk     05/15/2024   12:36 PM 02/27/2024    1:10 PM 08/03/2023   10:45 AM 05/12/2023    2:51 PM 05/02/2023    9:49 AM  Fall Risk   Falls in the past year? 1 1 1  0 0  Number falls in past yr: 1 1 1  0   Injury with Fall? 0 0 0 0   Risk for fall due to : Other (Comment) History of fall(s) History of fall(s) No Fall Risks   Risk for fall due to: Comment pt was having Afib during these few falls/per pt no injuries      Follow up Falls evaluation completed Falls evaluation completed Falls evaluation completed Falls prevention discussed     MEDICARE RISK AT HOME:  Medicare Risk at Home Any stairs in or around the  home?: Yes If so, are there any without handrails?: Yes Home free of loose throw rugs in walkways, pet beds, electrical cords, etc?: Yes Adequate lighting in your home to reduce risk of falls?: Yes Life alert?: No Use of a cane, walker or w/c?: Yes Grab bars in the bathroom?: Yes Shower chair or bench in shower?: No Elevated toilet seat or a handicapped toilet?: No  TIMED UP AND GO:  Was the test performed?  no  Cognitive Function: 6CIT completed        05/15/2024   12:52 PM 05/12/2023    2:55 PM 05/10/2022    2:53 PM 05/07/2021    2:23 PM  6CIT Screen  What Year? 0 points 0 points 0 points 0 points  What  month? 0 points 0 points 0 points 0 points  What time? 0 points 0 points 0 points 0 points  Count back from 20 0 points 0 points 0 points 0 points  Months in reverse 0 points 0 points 4 points 2 points  Repeat phrase 0 points 0 points 0 points 0 points  Total Score 0 points 0 points 4 points 2 points    Immunizations Immunization History  Administered Date(s) Administered   Fluad Quad(high Dose 65+) 07/19/2019, 07/29/2022   Fluad Trivalent(High Dose 65+) 08/03/2023   Hep A / Hep B 10/30/2019, 11/27/2019, 04/29/2020   Influenza,inj,Quad PF,6+ Mos 07/01/2018   Influenza-Unspecified 06/13/2018, 07/18/2020, 06/18/2021   Moderna Sars-Covid-2 Vaccination 12/21/2019, 01/21/2020, 08/30/2020   PNEUMOCOCCAL CONJUGATE-20 08/11/2021   Pneumococcal Conjugate-13 05/02/2020   Tdap 04/11/2019   Zoster Recombinant(Shingrix ) 05/29/2019, 08/21/2019    Screening Tests Health Maintenance  Topic Date Due   Lung Cancer Screening  Never done   Colonoscopy  01/15/2023   OPHTHALMOLOGY EXAM  12/01/2023   INFLUENZA VACCINE  04/13/2024   COVID-19 Vaccine (4 - 2025-26 season) 05/14/2024   FOOT EXAM  08/02/2024   HEMOGLOBIN A1C  08/28/2024   Diabetic kidney evaluation - eGFR measurement  02/26/2025   Diabetic kidney evaluation - Urine ACR  02/26/2025   Medicare Annual Wellness (AWV)   05/15/2025   DTaP/Tdap/Td (2 - Td or Tdap) 04/10/2029   Pneumococcal Vaccine: 50+ Years  Completed   Hepatitis C Screening  Completed   Zoster Vaccines- Shingrix   Completed   HPV VACCINES  Aged Out   Meningococcal B Vaccine  Aged Out   Hepatitis B Vaccines 19-59 Average Risk  Discontinued    Health Maintenance  Health Maintenance Due  Topic Date Due   Lung Cancer Screening  Never done   Colonoscopy  01/15/2023   OPHTHALMOLOGY EXAM  12/01/2023   INFLUENZA VACCINE  04/13/2024   COVID-19 Vaccine (4 - 2025-26 season) 05/14/2024   Health Maintenance Items Addressed: See Nurse Notes at the end of this note  Additional Screening:  Vision Screening: Recommended annual ophthalmology exams for early detection of glaucoma and other disorders of the eye. Would you like a referral to an eye doctor? No    Dental Screening: Recommended annual dental exams for proper oral hygiene  Community Resource Referral / Chronic Care Management: CRR required this visit?  Yes   CCM required this visit?  No   Plan:    I have personally reviewed and noted the following in the patient's chart:   Medical and social history Use of alcohol , tobacco or illicit drugs  Current medications and supplements including opioid prescriptions. Patient is not currently taking opioid prescriptions. Functional ability and status Nutritional status Physical activity Advanced directives List of other physicians Hospitalizations, surgeries, and ER visits in previous 12 months Vitals Screenings to include cognitive, depression, and falls Referrals and appointments  In addition, I have reviewed and discussed with patient certain preventive protocols, quality metrics, and best practice recommendations. A written personalized care plan for preventive services as well as general preventive health recommendations were provided to patient.   Ozie Ned, CMA   05/15/2024   After Visit Summary: (MyChart) Due to this  being a telephonic visit, the after visit summary with patients personalized plan was offered to patient via MyChart   Notes: Nothing significant to report at this time.

## 2024-05-15 NOTE — Patient Instructions (Addendum)
 [name] Foster , Thank you for taking time out of your busy schedule to complete your Annual Wellness Visit with me. I enjoyed our conversation and look forward to speaking with you again next year. I, as well as your care team,  appreciate your ongoing commitment to your health goals. Please review the following plan we discussed and let me know if I can assist you in the future. Your Game plan/ To Do List    Referrals: If you haven't heard from the office you've been referred to, please reach out to them at the phone provided.   Follow up Visits: We will see or speak with you next year for your Next Medicare AWV with our clinical staff on 05/16/25 at 12:30p.m. Have you seen your provider in the last 6 months (3 months if uncontrolled diabetes)? Yes  Clinician Recommendations:  Aim for 30 minutes of exercise or brisk walking, 6-8 glasses of water, and 5 servings of fruits and vegetables each day.       This is a list of the screenings recommended for you:  Health Maintenance  Topic Date Due   Screening for Lung Cancer  Never done   Colon Cancer Screening  01/15/2023   Eye exam for diabetics  12/01/2023   Flu Shot  04/13/2024   COVID-19 Vaccine (4 - 2025-26 season) 05/14/2024   Complete foot exam   08/02/2024   Hemoglobin A1C  08/28/2024   Yearly kidney function blood test for diabetes  02/26/2025   Yearly kidney health urinalysis for diabetes  02/26/2025   Medicare Annual Wellness Visit  05/15/2025   DTaP/Tdap/Td vaccine (2 - Td or Tdap) 04/10/2029   Pneumococcal Vaccine for age over 57  Completed   Hepatitis C Screening  Completed   Zoster (Shingles) Vaccine  Completed   HPV Vaccine  Aged Out   Meningitis B Vaccine  Aged Out   Hepatitis B Vaccine  Discontinued    Advanced directives: (Declined) Advance directive discussed with you today. Even though you declined this today, please call our office should you change your mind, and we can give you the proper paperwork for you to fill  out. Advance Care Planning is important because it:  [x]  Makes sure you receive the medical care that is consistent with your values, goals, and preferences  [x]  It provides guidance to your family and loved ones and reduces their decisional burden about whether or not they are making the right decisions based on your wishes.  Follow the link provided in your after visit summary or read over the paperwork we have mailed to you to help you started getting your Advance Directives in place. If you need assistance in completing these, please reach out to us  so that we can help you!  See attachments for Preventive Care and Fall Prevention Tips.

## 2024-05-17 ENCOUNTER — Other Ambulatory Visit: Payer: Self-pay | Admitting: Family Medicine

## 2024-05-17 DIAGNOSIS — N1831 Chronic kidney disease, stage 3a: Secondary | ICD-10-CM

## 2024-05-17 DIAGNOSIS — E1165 Type 2 diabetes mellitus with hyperglycemia: Secondary | ICD-10-CM

## 2024-05-17 MED ORDER — DAPAGLIFLOZIN PROPANEDIOL 10 MG PO TABS: 10.0000 mg | ORAL_TABLET | Freq: Every day | ORAL | 1 refills | Status: DC

## 2024-05-17 NOTE — Addendum Note (Signed)
 Addended by: Hermie Reagor G on: 05/17/2024 12:56 PM   Modules accepted: Orders

## 2024-05-17 NOTE — Telephone Encounter (Signed)
  FARXIGA  10 MG TABS tablet        Changed from: dapagliflozin  propanediol (FARXIGA ) 10 MG TABS tablet   Pharmacy comment: Alternative Requested:JARDIANCE  IS PREFERRED

## 2024-05-20 NOTE — Progress Notes (Unsigned)
 Cardiology Office Note:  .   Date:  05/20/2024  ID:  [name] Foster, DOB Feb 18, 1956, MRN [id number] PCP: Joesph Annabella HERO, FNP  [facility] Providers Cardiologist:  Ozell Fell, MD Cardiology APP:  Lelon Glendia DASEN, PA-C  Electrophysiologist:  Fonda Kitty, MD {  History of Present Illness: .   [name] Foster is a 68 y.o. male w/PMHx of  HTN, HLD, OSA (intol of CPAP), DM CAD S/p Inf-Post STEMI 03/2017 >> PCI:  DES to LCx  S/p DES to the L PDA in 09/2017 2/2 to [place]  S/p NSTEMI 06/2018 >> PCI:  Orbital atherectomy and DES to LAD AFib  Afib ablation Jan 2025  Missed his 1 mo visit  Saw Dr. Kitty 01/17/24, was to have stopped amio at 1 mo visit, but he missed that visit and was still on amiodarone , pt inquired about stopping eliquis  No symptoms of AFib Stopped amiodarone , planned for ~ 4 mo f/u and Af burden off amio Discuss longterm monitoring strategies vs perhaps watchman to a goal of stopping OAC   Renal biopsy Pre-op  team addressed > OK to hold plavix /eliquis  as discussed pre-op  Tele-visit 04/17/24, pt denied symptoms, cardiac concerns> felt an acceptable candidate for planned procedure.  Today's visit is scheduled as a 72mo visit ROS:   He is doing well. No CP, palpitations or cardiac awareness No symptoms of AFib, does think he would know Periodically checks his Kardia > no AFib No SOB No near syncope or syncope Easy bruising arms, no bleeding or signs of bleeding otherwise   Arrhythmia/AAD hx AFib found Aug 2024 Amiodarone  started Oct 2024 >> stopped May 2025 AFib ablation 10/06/23  Studies Reviewed: SABRA    EKG not done today   10/06/23: EPS/ablation CONCLUSIONS: 1. Successful PVI. 2. Successful ablation/isolation of the posterior wall. 3. Intracardiac echo reveals normal LV size and function, 5 distinct pulmonary veins, trivial pericardial effusion. 4. No early apparent complications. 5. Resume Eliquis  in 2 hours.  6. Stop amiodarone  in 1  month.  05/24/23: TTE  1. Left ventricular ejection fraction, by estimation, is 55 to 60%. The  left ventricle has normal function. The left ventricle has no regional  wall motion abnormalities. There is mild left ventricular hypertrophy.  Left ventricular diastolic parameters  are indeterminate.   2. Right ventricular systolic function is normal. The right ventricular  size is normal.   3. The mitral valve is normal in structure. Trivial mitral valve  regurgitation.   4. The aortic valve was not well visualized. Aortic valve regurgitation  is trivial. No aortic stenosis is present.   5. The inferior vena cava is normal in size with greater than 50%  respiratory variability, suggesting right atrial pressure of 3 mmHg.     Risk Assessment/Calculations:    Physical Exam:   VS:  There were no vitals taken for this visit.   Wt Readings from Last 3 Encounters:  05/15/24 178 lb (80.7 kg)  02/27/24 178 lb 9.6 oz (81 kg)  01/17/24 186 lb (84.4 kg)    GEN: Well nourished, well developed in no acute distress NECK: No JVD; No carotid bruits CARDIAC: RRR, no murmurs, rubs, gallops RESPIRATORY:  CTA b/l without rales, wheezing or rhonchi  ABDOMEN: Soft, non-tender, non-distended EXTREMITIES: No edema; No deformity   ASSESSMENT AND PLAN: .    persistent AFib CHA2DS2Vasc is 4, on eliquis , appropriately dosed Low/no burden off amio  He would like to be off a/c We discussed monitoring management strategies that we would  consider reliable enough to stop OAC (apple watch/watch with ECG capacity or loop implant) And watchman  For now he will give both some thought, and stay on his a/c  CAD No symptoms of angina C/w Dr. Cooper/Scott/team  HTN Looks good  Secondary hypercoagulable state 2/2 AFib   Dispo: back in 52mo, sooner if needed  Signed, Charlies Macario Arthur, PA-C

## 2024-05-21 ENCOUNTER — Ambulatory Visit: Attending: Physician Assistant | Admitting: Physician Assistant

## 2024-05-21 ENCOUNTER — Encounter: Payer: Self-pay | Admitting: Physician Assistant

## 2024-05-21 VITALS — BP 124/60 | HR 83 | Ht 69.0 in | Wt 185.2 lb

## 2024-05-21 DIAGNOSIS — I10 Essential (primary) hypertension: Secondary | ICD-10-CM | POA: Diagnosis not present

## 2024-05-21 DIAGNOSIS — I2510 Atherosclerotic heart disease of native coronary artery without angina pectoris: Secondary | ICD-10-CM

## 2024-05-21 DIAGNOSIS — I4819 Other persistent atrial fibrillation: Secondary | ICD-10-CM | POA: Diagnosis not present

## 2024-05-21 DIAGNOSIS — D6869 Other thrombophilia: Secondary | ICD-10-CM

## 2024-05-21 MED ORDER — EMPAGLIFLOZIN 25 MG PO TABS: 25.0000 mg | ORAL_TABLET | Freq: Every day | ORAL | 3 refills | Status: DC

## 2024-05-21 NOTE — Patient Instructions (Signed)
 Medication Instructions:    Your physician recommends that you continue on your current medications as directed. Please refer to the Current Medication list given to you today.   *If you need a refill on your cardiac medications before your next appointment, please call your pharmacy*   Lab Work: NONE ORDERED  TODAY    If you have labs (blood work) drawn today and your tests are completely normal, you will receive your results only by: MyChart Message (if you have MyChart) OR A paper copy in the mail If you have any lab test that is abnormal or we need to change your treatment, we will call you to review the results.    Testing/Procedures: NONE ORDERED  TODAY    Follow-Up: At [facility], you and your health needs are our priority.  As part of our continuing mission to provide you with exceptional heart care, our providers are all part of one team.  This team includes your primary Cardiologist (physician) and Advanced Practice Providers or APPs (Physician Assistants and Nurse Practitioners) who all work together to provide you with the care you need, when you need it.   Your next appointment:    DR WONDA /APP  NEXT AVAILABLE                                                                    AND    6 month(s)  Provider:   You may see Fonda Kitty, MD or one of the following Advanced Practice Providers on your designated Care Team:   Charlies Arthur, [place]    We recommend signing up for the patient portal called MyChart.  Sign up information is provided on this After Visit Summary.  MyChart is used to connect with patients for Virtual Visits (Telemedicine).  Patients are able to view lab/test results, encounter notes, upcoming appointments, etc.  Non-urgent messages can be sent to your provider as well.   To learn more about what you can do with MyChart, go to [email or web address].   Other Instructions

## 2024-05-21 NOTE — Telephone Encounter (Signed)
 Jardiance  sent in per [name].

## 2024-05-22 ENCOUNTER — Other Ambulatory Visit: Payer: Self-pay | Admitting: Radiology

## 2024-05-22 DIAGNOSIS — R809 Proteinuria, unspecified: Secondary | ICD-10-CM

## 2024-05-22 NOTE — Progress Notes (Signed)
 Chief Complaint: Patient was seen in consultation today for proteinuria  Referring Physician(s): Wise,[name]  Supervising Physician: Johann Sieving  Patient Status: [facility] - Out-pt  History of Present Illness: [name] Foster is a 68 y.o. male with DM, CAD with multivessel disease s/p PCI/DES in 2019 now with worsening proteinuria.  Patient referred to [facility] for medical renal biopsy at the request of Annabella Bash, NP.   [name] presents today in his usual state of health.  He has been NPO.  He does not take blood thinners.  He has arranged post-procedure transportation and care with Alisa.  He is understanding of the goals of the procedure and is agreeable to proceed.   Past Medical History:  Diagnosis Date   Arthritis    CAD (coronary artery disease)    a. 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 DES to left PDA 09/20/17 // c. 10/19: LCx and LPDA stents ok; s/p DES to pLAD and POBA to D1   Diabetes mellitus without complication (HCC)    type 2    Diabetic nephropathy (HCC)    Echocardiogram 08/2021    Echocardiogram 12/22: EF 60-65, no RWMA, GLS -19.4% (normal), normal RVSF, trivial MR, mild dilation of ascending aorta (37 mm)   Heart attack (HCC)     a blood clot is actually what caused my heart attack in 2018  , he states he is unsure where the blood clot originated from    Hyperlipidemia    Hypertension    Insomnia    Pulmonary hypertension (HCC)    a. TTE 03/25/17; EF 60-65%, nl WM, LV diastolic fxn nl, trivial AI, RV cavity size, wall thickness, and sys fxn nl, trivial TR, PASP 39 mmHg, trivial pericardial effusion // Echo 12/22: normal RVSP, PASP   SCCA (squamous cell carcinoma) of skin 03/26/2020   Mid Parietal Scalp (well diff)   Sleep apnea    could not use a cpap   Vitamin B12 deficiency 05/03/2020   Wears dentures    full  top-partial bottom   Wears glasses     Past Surgical History:  Procedure Laterality Date   ATRIAL FIBRILLATION ABLATION N/A 10/06/2023   Procedure: ATRIAL FIBRILLATION ABLATION;  Surgeon: Kennyth Chew, MD;  Location: [facility] INVASIVE CV LAB;  Service: Cardiovascular;  Laterality: N/A;   CARDIOVERSION N/A 05/30/2023   Procedure: CARDIOVERSION;  Surgeon: Delford Maude BROCKS, MD;  Location: [facility] INVASIVE CV LAB;  Service: Cardiovascular;  Laterality: N/A;   COLONOSCOPY  05/13/2010   Single anal papilla, 2 diminutive polyps at 10 cm, left-sided diverticula, one sigmoid and one a sending polyp, otherwise normal colon and TI.  Path with 1 tubular adenoma, otherwise hyperplastic polyps.  Recommendations repeat colonoscopy in 7 years.   COLONOSCOPY WITH PROPOFOL  N/A 11/22/2019   Rourk, Lamar HERO, MD -  ten 4-9 mm polyps in the rectum, sigmoid colon and cecum were removed, three 12-25 mm polyps at the splenic flexure and hepatic flexure removed with clip placement to the largest polyp   COLONOSCOPY WITH PROPOFOL  N/A 01/14/2022   Procedure: COLONOSCOPY WITH PROPOFOL ;  Surgeon: Shaaron Lamar HERO, MD;  Location: [facility];  Service: Endoscopy;  Laterality: N/A;  11:15am, asa 3   CORONARY ATHERECTOMY N/A 07/03/2018   Procedure: CORONARY ATHERECTOMY;  Surgeon: Verlin Lonni BIRCH, MD;  Location: [facility];  Service: Cardiovascular;  Laterality: N/A;   CORONARY PRESSURE/FFR STUDY N/A 09/20/2017   Procedure: INTRAVASCULAR PRESSURE WIRE/FFR STUDY;  Surgeon: Anner Alm ORN, MD;  Location: [facility] INVASIVE CV LAB;  Service: Cardiovascular;  Laterality: N/A;   CORONARY PRESSURE/FFR STUDY N/A 06/30/2018   Procedure: INTRAVASCULAR PRESSURE WIRE/FFR STUDY;  Surgeon: Mady Bruckner, MD;  Location: [facility];  Service: Cardiovascular;  Laterality: N/A;   CORONARY STENT INTERVENTION N/A 09/20/2017   Procedure: CORONARY STENT INTERVENTION;  Surgeon: Anner Alm ORN, MD;  Location: [facility] INVASIVE CV LAB;  Service:  Cardiovascular;  Laterality: N/A;   CORONARY STENT INTERVENTION N/A 07/03/2018   Procedure: CORONARY STENT INTERVENTION;  Surgeon: Verlin Bruckner BIRCH, MD;  Location: [facility];  Service: Cardiovascular;  Laterality: N/A;   CORONARY/GRAFT ACUTE MI REVASCULARIZATION N/A 03/25/2017   Procedure: Coronary/Graft Acute MI Revascularization;  Surgeon: Wonda Sharper, MD;  Location: [facility] INVASIVE CV LAB;  Service: Cardiovascular;  Laterality: N/A;   GANGLION CYST EXCISION Left 07/25/2014   Procedure: EXCISION VOLAR AND DORSAL GANGLION LEFT WRIST;  Surgeon: Franky Curia, MD;  Location: [facility];  Service: Orthopedics;  Laterality: Left;   KNEE ARTHROSCOPY     rifgr and left   LEFT HEART CATH AND CORONARY ANGIOGRAPHY N/A 03/25/2017   Procedure: Left Heart Cath and Coronary Angiography;  Surgeon: Wonda Sharper, MD;  Location: [facility] INVASIVE CV LAB;  Service: Cardiovascular;  Laterality: N/A;   LEFT HEART CATH AND CORONARY ANGIOGRAPHY N/A 09/20/2017   Procedure: LEFT HEART CATH AND CORONARY ANGIOGRAPHY;  Surgeon: Anner Alm ORN, MD;  Location: [facility] INVASIVE CV LAB;  Service: Cardiovascular;  Laterality: N/A;   LEFT HEART CATH AND CORONARY ANGIOGRAPHY N/A 06/30/2018   Procedure: LEFT HEART CATH AND CORONARY ANGIOGRAPHY;  Surgeon: Mady Bruckner, MD;  Location: [facility];  Service: Cardiovascular;  Laterality: N/A;   POLYPECTOMY  11/22/2019   Procedure: POLYPECTOMY;  Surgeon: Shaaron Lamar HERO, MD;  [facility];  Service: Endoscopy;;   POLYPECTOMY  01/14/2022   Procedure: POLYPECTOMY;  Surgeon: Shaaron Lamar HERO, MD;  [facility];  Service: Endoscopy;;   SHOULDER ARTHROSCOPY  2012   left   SHOULDER ARTHROSCOPY     right   TEMPORARY PACEMAKER N/A 03/25/2017   Procedure: Temporary Pacemaker;  Surgeon: Wonda Sharper, MD;  Location: [facility] INVASIVE CV LAB;  Service: Cardiovascular;  Laterality: N/A;   TONSILLECTOMY     TOTAL HIP ARTHROPLASTY Right  04/25/2019   TOTAL HIP ARTHROPLASTY Right 04/25/2019   Procedure: TOTAL HIP ARTHROPLASTY ANTERIOR APPROACH;  Surgeon: Fidel Rogue, MD;  Location: [facility];  Service: Orthopedics;  Laterality: Right;   TOTAL KNEE ARTHROPLASTY  2002   left    Allergies: Patient has no known allergies.  Medications: Prior to Admission medications   Medication Sig Start Date End Date Taking? Authorizing Provider  albuterol  (VENTOLIN  HFA) 108 (90 Base) MCG/ACT inhaler Inhale 1-2 puffs into the lungs every 4 (four) hours as needed for wheezing or shortness of breath. 08/03/23   Joesph Annabella HERO, FNP  amLODipine  (NORVASC ) 5 MG tablet Take 1 tablet (5 mg total) by mouth daily. 02/27/24   Joesph Annabella HERO, FNP  apixaban  (ELIQUIS ) 5 MG TABS tablet Take 1 tablet (5 mg total) by mouth 2 (two) times daily. 05/02/23   Joesph Annabella HERO, FNP  atorvastatin  (LIPITOR ) 40 MG tablet Take 1 tablet (40 mg total) by mouth daily at 6 PM. 05/02/23   Joesph Annabella HERO, FNP  b complex vitamins capsule Take 1 capsule  by mouth daily.    [provider]  Cholecalciferol (VITAMIN D ) 50 MCG (2000 UT) tablet Take 2,000 Units by mouth daily.    [provider]  clopidogrel  (PLAVIX ) 75 MG tablet Take 1 tablet (75 mg total) by mouth daily. 05/02/23   Joesph Annabella HERO, FNP  Coenzyme Q10 (COQ-10) 100 MG CAPS Take 100 mg by mouth daily.    [provider]  DULoxetine  (CYMBALTA ) 30 MG capsule Take 1 capsule (30 mg total) by mouth daily. 02/27/24   Joesph Annabella HERO, FNP  empagliflozin  (JARDIANCE ) 25 MG TABS tablet Take 1 tablet (25 mg total) by mouth daily. 05/21/24   Joesph Annabella HERO, FNP  ezetimibe  (ZETIA ) 10 MG tablet Take 1 tablet (10 mg total) by mouth daily. 02/27/24   Joesph Annabella HERO, FNP  furosemide  (LASIX ) 20 MG tablet Take 20 mg by mouth daily as needed for edema.    [provider]  hydrALAZINE  (APRESOLINE ) 50 MG tablet Take 1 tablet (50 mg total) by mouth every 8 (eight) hours. 05/02/23   Joesph Annabella HERO, FNP  hydrochlorothiazide  (HYDRODIURIL ) 12.5 MG tablet Take 1 tablet (12.5 mg total) by mouth daily as needed (Swelling in ankles). (NEEDS TO BE SEEN BEFORE NEXT REFILL) 08/03/23   Joesph Annabella HERO, FNP  isosorbide  mononitrate (IMDUR ) 30 MG 24 hr tablet TAKE 1/2 OF A TABLET (15 MG TOTAL) BY MOUTH DAILY 04/27/24   Joesph Annabella HERO, FNP  losartan  (COZAAR ) 25 MG tablet Take 1 tablet (25 mg total) by mouth daily. 03/01/24   Joesph Annabella HERO, FNP  metoprolol  tartrate (LOPRESSOR ) 50 MG tablet Take 1 tablet (50 mg total) by mouth 2 (two) times daily. 05/02/23   Joesph Annabella HERO, FNP  mirtazapine  (REMERON ) 15 MG tablet Take 1 tablet (15 mg total) by mouth at bedtime. 02/27/24   Joesph Annabella HERO, FNP  nitroGLYCERIN  (NITROSTAT ) 0.4 MG SL tablet DISSOLVE 1 TAB UNDER TONGUE AT ONSET OF CHEST PAIN. REPEAT EVERY 5 MINUTES FOR 2 DOSES AS NEEDED 08/03/23   Joesph Annabella HERO, FNP  RESTASIS  0.05 % ophthalmic emulsion Place 1 drop into both eyes 2 (two) times daily. 04/21/20   [provider]  simethicone (MYLICON) 125 MG chewable tablet Chew 125 mg by mouth every 6 (six) hours as needed for flatulence.    [provider]  tamsulosin  (FLOMAX ) 0.4 MG CAPS capsule Take 1 capsule (0.4 mg total) by mouth daily. 05/02/23   Joesph Annabella HERO, FNP     Family History  Problem Relation Age of Onset   Hypertension Mother    Hypertension Father    Hypertension Brother    Heart disease Maternal Grandfather    Colon cancer Neg Hx     Social History   Socioeconomic History   Marital status: Widowed    Spouse name: Not on file   Number of children: 2   Years of education: Not on file   Highest education level: Not on file  Occupational History   Occupation: Retired  Tobacco Use   Smoking status: Former    Current packs/day: 0.00    Average packs/day: 2.0 packs/day for 30.0 years (60.0 ttl pk-yrs)    Types: Cigarettes    Start date: 03/26/1987    Quit date: 03/25/2017    Years since  quitting: 7.1   Smokeless tobacco: Never  Vaping Use   Vaping status: Never Used  Substance and Sexual Activity   Alcohol  use: Yes    Comment: 3-6 shots of liquor daily  Drug use: Not Currently    Types: Marijuana    Comment: last used about 10 years ago (08/23/19)   Sexual activity: Not Currently  Other Topics Concern   Not on file  Social History Narrative   Per wife, pt was preparing to retire soon from his job at [employer].   05/24/21 - wife passed away a couple weeks ago - he lives alone, but has lot of family nearby   Son next door, daughter lives in [place]   Social Drivers of Health   Financial Resource Strain: Medium Risk (05/15/2024)   Overall Financial Resource Strain (CARDIA)    Difficulty of Paying Living Expenses: Somewhat hard  Food Insecurity: No Food Insecurity (05/15/2024)   Hunger Vital Sign    Worried About Running Out of Food in the Last Year: Never true    Ran Out of Food in the Last Year: Never true  Transportation Needs: No Transportation Needs (05/15/2024)   PRAPARE - Administrator, Civil Service (Medical): No    Lack of Transportation (Non-Medical): No  Physical Activity: Insufficiently Active (05/15/2024)   Exercise Vital Sign    Days of Exercise per Week: 2 days    Minutes of Exercise per Session: 20 min  Stress: No Stress Concern Present (05/15/2024)   [employer] of Occupational Health - Occupational Stress Questionnaire    Feeling of Stress: Not at all  Social Connections: Socially Isolated (05/15/2024)   Social Connection and Isolation Panel    Frequency of Communication with Friends and Family: Once a week    Frequency of Social Gatherings with Friends and Family: Once a week    Attends Religious Services: Never    Database administrator or Organizations: No    Attends Banker Meetings: Never    Marital Status: Widowed     Review of Systems: A 12 point ROS discussed and pertinent positives are indicated in the HPI  above.  All other systems are negative.  Review of Systems  Constitutional:  Negative for fatigue and fever.  Respiratory:  Negative for cough and shortness of breath.   Cardiovascular:  Negative for chest pain.  Gastrointestinal:  Negative for abdominal pain, nausea and vomiting.  Musculoskeletal:  Negative for back pain.  Psychiatric/Behavioral:  Negative for behavioral problems and confusion.     Vital Signs: BP (!) 140/72 (BP Location: Right Arm)   Pulse 70   Temp 99.2 F (37.3 C) (Oral)   Resp 12   Ht 5' 9 (1.753 m)   Wt 185 lb (83.9 kg)   SpO2 95%   BMI 27.32 kg/m   Physical Exam Vitals and nursing note reviewed.  Constitutional:      General: He is not in acute distress.    Appearance: Normal appearance. He is not ill-appearing.  HENT:     Mouth/Throat:     Mouth: Mucous membranes are moist.     Pharynx: Oropharynx is clear.  Cardiovascular:     Rate and Rhythm: Normal rate and regular rhythm.  Pulmonary:     Effort: Pulmonary effort is normal.     Breath sounds: Normal breath sounds.  Abdominal:     General: Abdomen is flat. There is no distension.     Palpations: Abdomen is soft.  Skin:    General: Skin is warm and dry.  Neurological:     General: No focal deficit present.     Mental Status: He is alert and oriented to person, place, and time.  Mental status is at baseline.  Psychiatric:        Mood and Affect: Mood normal.        Behavior: Behavior normal.        Thought Content: Thought content normal.        Judgment: Judgment normal.      MD Evaluation Airway: WNL Heart: WNL Abdomen: WNL Chest/ Lungs: WNL ASA  Classification: 3 Mallampati/Airway Score: Two   Imaging: No results found.  Labs:  CBC: Recent Labs    06/10/23 1513 10/06/23 0536 02/27/24 1318 05/23/24 0648  WBC 6.1 5.0 4.8 6.1  HGB 12.5* 13.9 13.6 12.1*  HCT 38.3* 43.4 45.2 38.0*  PLT 138* 116* 170 193    COAGS: Recent Labs    05/23/24 0648  INR 0.9     BMP: Recent Labs    06/10/23 1513 08/24/23 1151 10/06/23 0536 02/27/24 1318  NA 141  --  134* 139  K 3.7  --  3.7 4.5  CL 104  --  98 101  CO2 20*  --  25 23  GLUCOSE 100*  --  139* 97  BUN 19  --  19 11  CALCIUM  8.0*  --  9.1 8.9  CREATININE 1.37* 1.90* 1.59* 1.48*  GFRNONAA 56*  --  47*  --     LIVER FUNCTION TESTS: Recent Labs    02/27/24 1318  BILITOT 0.3  AST 21  ALT 12  ALKPHOS 90  PROT 6.0  ALBUMIN 3.6*    TUMOR MARKERS: No results for input(s): AFPTM, CEA, CA199, CHROMGRNA in the last 8760 hours.  Assessment and Plan: Patient with past medical history of DM, CAD  presents with complaint of worsening proteinuria.  IR consulted for medical renal biopsy at the request of Annabella Bash, NP. Case reviewed by Dr. Johann who approves patient for procedure.  Patient presents today in their usual state of health.  He has been NPO and is not currently on blood thinners.   BP 153/77.  Per Dr. Johann give 10mg  labetalol .  140/72 after administration-- will proceed.  Risks and benefits of biopsy was discussed with the patient and/or patient's family including, but not limited to bleeding, infection, damage to adjacent structures or low yield requiring additional tests.  All of the questions were answered and there is agreement to proceed.  Consent signed and in chart.   Thank you for this interesting consult.  I greatly enjoyed meeting MARWIN PRIMMER and look forward to participating in their care.  A copy of this report was sent to the requesting provider on this date.  Electronically Signed: Shantika Bermea Sue-Ellen Oddis Westling, PA 05/23/2024, 8:27 AM   I spent a total of  30 Minutes   in face to face in clinical consultation, greater than 50% of which was counseling/coordinating care for proteinuria.

## 2024-05-23 ENCOUNTER — Ambulatory Visit (HOSPITAL_COMMUNITY)
Admission: RE | Admit: 2024-05-23 | Discharge: 2024-05-23 | Disposition: A | Discharge status: Home / Self Care | Source: Ambulatory Visit | Attending: Nurse Practitioner | Admitting: Nurse Practitioner

## 2024-05-23 ENCOUNTER — Other Ambulatory Visit: Payer: Self-pay

## 2024-05-23 DIAGNOSIS — I2510 Atherosclerotic heart disease of native coronary artery without angina pectoris: Secondary | ICD-10-CM | POA: Diagnosis not present

## 2024-05-23 DIAGNOSIS — E119 Type 2 diabetes mellitus without complications: Secondary | ICD-10-CM | POA: Insufficient documentation

## 2024-05-23 DIAGNOSIS — R809 Proteinuria, unspecified: Secondary | ICD-10-CM | POA: Diagnosis not present

## 2024-05-23 LAB — CBC
HCT: 38 % — ABNORMAL LOW (ref 39.0–52.0)
Hemoglobin: 12.1 g/dL — ABNORMAL LOW (ref 13.0–17.0)
MCH: 26.5 pg (ref 26.0–34.0)
MCHC: 31.8 g/dL (ref 30.0–36.0)
MCV: 83.2 fL (ref 80.0–100.0)
Platelets: 193 K/uL (ref 150–400)
RBC: 4.57 MIL/uL (ref 4.22–5.81)
RDW: 16.3 % — ABNORMAL HIGH (ref 11.5–15.5)
WBC: 6.1 K/uL (ref 4.0–10.5)
nRBC: 0 % (ref 0.0–0.2)

## 2024-05-23 LAB — GLUCOSE, CAPILLARY: Glucose-Capillary: 131 mg/dL — ABNORMAL HIGH (ref 70–99)

## 2024-05-23 LAB — PROTIME-INR
INR: 0.9 (ref 0.8–1.2)
Prothrombin Time: 13.2 s (ref 11.4–15.2)

## 2024-05-23 MED ORDER — FENTANYL CITRATE (PF) 100 MCG/2ML IJ SOLN
INTRAMUSCULAR | Status: AC
  Filled 2024-05-23: qty 2

## 2024-05-23 MED ORDER — MIDAZOLAM HCL 2 MG/2ML IJ SOLN
INTRAMUSCULAR | Status: AC | PRN
  Administered 2024-05-23: 1 mg via INTRAVENOUS
  Administered 2024-05-23 (×2): 0.5 mg via INTRAVENOUS

## 2024-05-23 MED ORDER — HYDRALAZINE HCL 20 MG/ML IJ SOLN
INTRAMUSCULAR | Status: AC
  Filled 2024-05-23: qty 1

## 2024-05-23 MED ORDER — MIDAZOLAM HCL 2 MG/2ML IJ SOLN
INTRAMUSCULAR | Status: AC
  Filled 2024-05-23: qty 2

## 2024-05-23 MED ORDER — LABETALOL HCL 5 MG/ML IV SOLN
INTRAVENOUS | Status: AC
  Filled 2024-05-23: qty 4

## 2024-05-23 MED ORDER — LIDOCAINE HCL (PF) 1 % IJ SOLN
10.0000 mL | Freq: Once | INTRAMUSCULAR | Status: AC
  Administered 2024-05-23: 10 mL via INTRADERMAL

## 2024-05-23 MED ORDER — HYDROCODONE-ACETAMINOPHEN 5-325 MG PO TABS: 1.0000 | ORAL_TABLET | ORAL | Status: DC | PRN

## 2024-05-23 MED ORDER — FENTANYL CITRATE (PF) 100 MCG/2ML IJ SOLN
INTRAMUSCULAR | Status: AC | PRN
  Administered 2024-05-23 (×2): 25 ug via INTRAVENOUS
  Administered 2024-05-23: 50 ug via INTRAVENOUS

## 2024-05-23 MED ORDER — LABETALOL HCL 5 MG/ML IV SOLN
10.0000 mg | Freq: Once | INTRAVENOUS | Status: AC
  Administered 2024-05-23: 10 mg via INTRAVENOUS

## 2024-05-23 NOTE — Discharge Instructions (Signed)
 Drink plenty of fluids over the next 2-3 days.  Needle Biopsy, Care After These instructions tell you how to care for yourself after your procedure. Your doctor may give you more instructions. Call your doctor if you have any problems or questions. What can I expect after the procedure? After a needle biopsy, it is common to have these things at the puncture site: Soreness. Bruising. Mild pain. These things should go away after a few days. Follow these instructions at home: Puncture site care  Wash your hands with soap and water for at least 20 seconds before and after you change your bandage (dressing). If you cannot use soap and water, use hand sanitizer. Follow instructions from your doctor about how to take care of your puncture site. This includes: Remove Dressing in 24 hours.  You May Shower In 24 Hours. DO NOT SCRUB over site.  Check your puncture site every day for signs of infection. Check for: Redness, swelling, or more pain. Fluid or blood. Warmth. Pus or a bad smell. General instructions Go back to your normal activities when your doctor says that it is safe. Ask if there is anything you cannot do as you heal. Take over-the-counter and prescription medicines only as told by your doctor. Do not take baths, swim, or use a hot tub for at least 5 days.  Keep all follow-up visits. Ask if you need an appointment to get your biopsy results. Contact a doctor if: You have a fever. You have redness, swelling, or more pain at the puncture site, and it lasts longer than a few days. You have fluid, blood, or pus coming from the puncture site. Your puncture site feels warm. Get help right away if: You have very bad bleeding from the puncture site. Summary After the procedure, it is common to have soreness, bruising, or mild pain at the puncture site. Check your puncture site every day for signs of infection, such as redness, swelling, or more pain. Get help right away if you have  very bad bleeding from your puncture site. This information is not intended to replace advice given to you by your health care provider. Make sure you discuss any questions you have with your health care provider. Document Revised: 02/18/2021 Document Reviewed: 02/18/2021 Elsevier Patient Education  2024 [employer].

## 2024-05-23 NOTE — Sedation Documentation (Signed)
 Site assessed by this RN and short stay RN. Bandaid in place. No bleeding noted. Patient alert and asking for daughter in law

## 2024-05-23 NOTE — Procedures (Signed)
  Procedure:  US  core renal biopsy LLP 18g x2 Preprocedure diagnosis: Diagnoses of Nephrotic range proteinuria and Proteinuria, unspecified type were pertinent to this visit. Postprocedure diagnosis: same EBL:    minimal Complications:   none immediate  See full dictation in [employer].  CHARM Toribio Faes MD Main # [phone] Pager  [phone] Mobile [phone]

## 2024-05-25 ENCOUNTER — Encounter (HOSPITAL_COMMUNITY): Payer: Self-pay

## 2024-05-25 LAB — SURGICAL PATHOLOGY

## 2024-05-30 DIAGNOSIS — R809 Proteinuria, unspecified: Secondary | ICD-10-CM | POA: Diagnosis not present

## 2024-05-30 DIAGNOSIS — D631 Anemia in chronic kidney disease: Secondary | ICD-10-CM | POA: Diagnosis not present

## 2024-05-30 DIAGNOSIS — E211 Secondary hyperparathyroidism, not elsewhere classified: Secondary | ICD-10-CM | POA: Diagnosis not present

## 2024-05-31 ENCOUNTER — Telehealth: Payer: Self-pay | Admitting: Pharmacist

## 2024-05-31 ENCOUNTER — Other Ambulatory Visit

## 2024-05-31 DIAGNOSIS — E1165 Type 2 diabetes mellitus with hyperglycemia: Secondary | ICD-10-CM

## 2024-05-31 MED ORDER — DAPAGLIFLOZIN PROPANEDIOL 10 MG PO TABS: 10.0000 mg | ORAL_TABLET | Freq: Every day | ORAL | 4 refills | Status: AC

## 2024-05-31 NOTE — Telephone Encounter (Signed)
 Please route me PAP for Farxiga  His new mailing address is : [address] [place], [address]   Samples provided for farxiga  10mg  daily #28 TABS Lot #[id number] EXP 07/13/26

## 2024-06-06 ENCOUNTER — Telehealth: Payer: Self-pay

## 2024-06-06 ENCOUNTER — Other Ambulatory Visit (HOSPITAL_COMMUNITY): Payer: Self-pay

## 2024-06-06 NOTE — Progress Notes (Signed)
 Pharmacy Medication Assistance Program Note    06/27/2024  Patient ID: [name] Foster, male   DOB: Aug 26, 1956, 68 y.o.   MRN: [id number]     06/06/2024  Outreach Medication One  Manufacturer Medication One [employer]  [employer] Farxiga   Dose of Farxiga  10MG   Type of Radiographer, therapeutic Assistance  Date Application Sent to Prescriber 06/06/2024  Date Application Submitted to Manufacturer 06/27/2024  Method Application Sent to Manufacturer Fax     RENEWAL - SUBMITTED

## 2024-06-07 ENCOUNTER — Encounter: Payer: Self-pay | Admitting: Emergency Medicine

## 2024-06-13 DIAGNOSIS — N2581 Secondary hyperparathyroidism of renal origin: Secondary | ICD-10-CM | POA: Diagnosis not present

## 2024-06-13 DIAGNOSIS — I7121 Aneurysm of the ascending aorta, without rupture: Secondary | ICD-10-CM | POA: Diagnosis not present

## 2024-06-13 DIAGNOSIS — D638 Anemia in other chronic diseases classified elsewhere: Secondary | ICD-10-CM | POA: Diagnosis not present

## 2024-06-13 DIAGNOSIS — N1831 Chronic kidney disease, stage 3a: Secondary | ICD-10-CM | POA: Diagnosis not present

## 2024-06-14 ENCOUNTER — Telehealth: Payer: Self-pay | Admitting: Pharmacist

## 2024-06-14 NOTE — Telephone Encounter (Signed)
 Assisted in completion of [place] patient assistance application (Farxiga ) for patient.  Patient and provider signatures captured, reviewed and faxed back to CPhT.  Medication list reviewed and FYI tab updated.   Kristine Tiley Dattero Jereline Ticer, PharmD, BCACP, CPP Clinical Pharmacist, [facility]

## 2024-06-20 ENCOUNTER — Ambulatory Visit: Attending: Cardiology | Admitting: Physician Assistant

## 2024-06-20 ENCOUNTER — Encounter: Payer: Self-pay | Admitting: Physician Assistant

## 2024-06-20 VITALS — BP 101/62 | HR 86 | Resp 16 | Ht 69.0 in | Wt 184.6 lb

## 2024-06-20 DIAGNOSIS — I10 Essential (primary) hypertension: Secondary | ICD-10-CM | POA: Diagnosis not present

## 2024-06-20 DIAGNOSIS — E119 Type 2 diabetes mellitus without complications: Secondary | ICD-10-CM

## 2024-06-20 DIAGNOSIS — I2510 Atherosclerotic heart disease of native coronary artery without angina pectoris: Secondary | ICD-10-CM | POA: Diagnosis not present

## 2024-06-20 DIAGNOSIS — E785 Hyperlipidemia, unspecified: Secondary | ICD-10-CM

## 2024-06-20 DIAGNOSIS — I4891 Unspecified atrial fibrillation: Secondary | ICD-10-CM | POA: Diagnosis not present

## 2024-06-20 NOTE — Patient Instructions (Addendum)
 Medication Instructions:  Your physician recommends that you continue on your current medications as directed. Please refer to the Current Medication list given to you today.   Lab Work: Your provider recommends that you have labs drawn today: Lipid panel  If you have labs (blood work) drawn today and your tests are completely normal, you will receive your results only by: MyChart Message (if you have MyChart) OR A paper copy in the mail If you have any lab test that is abnormal or we need to change your treatment, we will call you to review the results.     Follow-Up: At [facility], you and your health needs are our priority.  As part of our continuing mission to provide you with exceptional heart care, our providers are all part of one team.  This team includes your primary Cardiologist (physician) and Advanced Practice Providers or APPs (Physician Assistants and Nurse Practitioners) who all work together to provide you with the care you need, when you need it.  Your next appointment:   In 12 months   Provider:   Ozell Fell, MD     Other Instructions: Please follow up with Dr. Kennyth in March.

## 2024-06-20 NOTE — Progress Notes (Unsigned)
 Cardiology Office Note   Date:  06/20/2024  ID:  [name] Foster, DOB 11/08/1955, MRN [id number] PCP: Joesph Annabella HERO, FNP  [facility] Providers Cardiologist:  Ozell Fell, MD Cardiology APP:  Lelon Glendia DASEN, PA-C  Electrophysiologist:  Fonda Kitty, MD { Click to update primary MD,subspecialty MD or APP then REFRESH:1}    History of Present Illness [name] Foster is a 68 y.o. male with past medical history of CAD, hypertension, hyperlipidemia, DM2, CKD, OSA, and atrial fibrillation.  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  He later had DES to RPDA vessel in January 2019 secondary to unstable angina.  He had NSTEMI in October 2019 and underwent orbital arthrectomy and DES to LAD.  He has been diagnosed with atrial fibrillation since August 2024.  He has been started on Eliquis  and metoprolol  for rate control.  Patient was last seen by Dr. Fell in August 2024 for A-fib who recommended proceed with cardioversion.  He underwent successful cardioversion in September 2024 and has been followed by A-fib clinic since.  He had brief episode of A-fib and was seen in the ED on 06/10/2023, he converted to sinus rhythm and was sent home.  By the time he followed up in October 2024, he had recurrent A-fib.  He was started on amiodarone  therapy.  He has been established with Dr. Kitty of EP service since November 2024 and ultimately underwent A-fib ablation on 10/06/2023.  Amiodarone  was stopped in May 2025.  He was most recently seen by Charlies Pae he Alamine/04/2024 at which time he inquired about coming off of anticoagulation therapy.  Patient presents today for follow-up.  He denies any exertional chest pain or shortness of breath.  He has neuropathy in both feet but no claudication symptoms.  He does not have significant carotid bruit on exam.  He has not had any palpitation, shortness of breath or fatigue.  He previously had very good  cardiac awareness of A-fib.  He has not had A-fib since his ablation procedure in January.  Overall, patient is doing very well.  He can follow-up with Dr. Kitty in March and follow-up with Dr. Fell in 1 year.  He is due for fasting lipid panel which we will obtain today.  He is renal function is stable and he is being followed by nephrologist in [place].  Now today, he primarily resides in [place].  He enjoys the hobby of finding stuff using his metal detector.  ROS: ***  Studies Reviewed      *** Risk Assessment/Calculations {Does this patient have ATRIAL FIBRILLATION?:[id number]}         Physical Exam VS:  BP 101/62 (BP Location: Right Arm, Patient Position: Sitting, Cuff Size: Normal)   Pulse 86   Resp 16   Ht 5' 9 (1.753 m)   Wt 184 lb 9.6 oz (83.7 kg)   SpO2 92%   BMI 27.26 kg/m        Wt Readings from Last 3 Encounters:  06/20/24 184 lb 9.6 oz (83.7 kg)  05/23/24 185 lb (83.9 kg)  05/21/24 185 lb 3.2 oz (84 kg)    GEN: Well nourished, well developed in no acute distress NECK: No JVD; No carotid bruits CARDIAC: ***RRR, no murmurs, rubs, gallops RESPIRATORY:  Clear to auscultation without rales, wheezing or rhonchi  ABDOMEN: Soft, non-tender, non-distended EXTREMITIES:  No edema; No deformity   ASSESSMENT AND PLAN ***    {Are you ordering a  CV Procedure (e.g. stress test, cath, DCCV, TEE, etc)?   Press F2        :[id number]}  Dispo: ***  Signed, Scot Ford, PA

## 2024-06-21 LAB — LIPID PANEL
Chol/HDL Ratio: 2.5 ratio (ref 0.0–5.0)
Cholesterol, Total: 112 mg/dL (ref 100–199)
HDL: 44 mg/dL (ref >39)
LDL Chol Calc (NIH): 52 mg/dL (ref 0–99)
Triglycerides: 80 mg/dL (ref 0–149)
VLDL Cholesterol Cal: 16 mg/dL (ref 5–40)

## 2024-06-22 ENCOUNTER — Ambulatory Visit: Payer: Self-pay | Admitting: Physician Assistant

## 2024-06-27 ENCOUNTER — Other Ambulatory Visit (HOSPITAL_COMMUNITY): Payer: Self-pay

## 2024-07-02 NOTE — Progress Notes (Signed)
 Pharmacy Medication Assistance Program Note    07/02/2024  Patient ID: [name] Foster, male   DOB: 1956-01-06, 68 y.o.   MRN: [id number]     06/06/2024  Outreach Medication One  Manufacturer Medication One [employer]  [employer] Farxiga   Dose of Farxiga  10MG   Type of Radiographer, therapeutic Assistance  Date Application Sent to Prescriber 06/06/2024  Date Application Submitted to Manufacturer 06/27/2024  Method Application Sent to Manufacturer Fax  Patient Assistance Determination Approved  Approval Start Date 06/29/2024  Approval End Date 09/12/2025     APPROVED

## 2024-07-24 ENCOUNTER — Other Ambulatory Visit: Payer: Self-pay | Admitting: Family Medicine

## 2024-07-24 DIAGNOSIS — R399 Unspecified symptoms and signs involving the genitourinary system: Secondary | ICD-10-CM

## 2024-07-24 DIAGNOSIS — I2510 Atherosclerotic heart disease of native coronary artery without angina pectoris: Secondary | ICD-10-CM

## 2024-07-24 DIAGNOSIS — I252 Old myocardial infarction: Secondary | ICD-10-CM

## 2024-07-24 DIAGNOSIS — I152 Hypertension secondary to endocrine disorders: Secondary | ICD-10-CM

## 2024-07-24 DIAGNOSIS — I700 Atherosclerosis of aorta: Secondary | ICD-10-CM

## 2024-08-25 ENCOUNTER — Other Ambulatory Visit: Payer: Self-pay | Admitting: Family Medicine

## 2024-08-25 DIAGNOSIS — I4891 Unspecified atrial fibrillation: Secondary | ICD-10-CM

## 2024-08-25 DIAGNOSIS — E1159 Type 2 diabetes mellitus with other circulatory complications: Secondary | ICD-10-CM

## 2024-08-30 ENCOUNTER — Ambulatory Visit (INDEPENDENT_AMBULATORY_CARE_PROVIDER_SITE_OTHER): Payer: Self-pay | Admitting: Family Medicine

## 2024-08-30 ENCOUNTER — Encounter: Payer: Self-pay | Admitting: Family Medicine

## 2024-08-30 VITALS — BP 136/72 | HR 87 | Temp 98.6°F | Ht 69.0 in | Wt 179.4 lb

## 2024-08-30 DIAGNOSIS — Z7984 Long term (current) use of oral hypoglycemic drugs: Secondary | ICD-10-CM | POA: Diagnosis not present

## 2024-08-30 DIAGNOSIS — I152 Hypertension secondary to endocrine disorders: Secondary | ICD-10-CM

## 2024-08-30 DIAGNOSIS — I4819 Other persistent atrial fibrillation: Secondary | ICD-10-CM | POA: Diagnosis not present

## 2024-08-30 DIAGNOSIS — N1831 Chronic kidney disease, stage 3a: Secondary | ICD-10-CM | POA: Diagnosis not present

## 2024-08-30 DIAGNOSIS — D6869 Other thrombophilia: Secondary | ICD-10-CM

## 2024-08-30 DIAGNOSIS — R809 Proteinuria, unspecified: Secondary | ICD-10-CM | POA: Diagnosis not present

## 2024-08-30 DIAGNOSIS — E1129 Type 2 diabetes mellitus with other diabetic kidney complication: Secondary | ICD-10-CM | POA: Diagnosis not present

## 2024-08-30 DIAGNOSIS — E1121 Type 2 diabetes mellitus with diabetic nephropathy: Secondary | ICD-10-CM

## 2024-08-30 DIAGNOSIS — E1159 Type 2 diabetes mellitus with other circulatory complications: Secondary | ICD-10-CM | POA: Diagnosis not present

## 2024-08-30 DIAGNOSIS — E1165 Type 2 diabetes mellitus with hyperglycemia: Secondary | ICD-10-CM | POA: Diagnosis not present

## 2024-08-30 LAB — BAYER DCA HB A1C WAIVED: HB A1C (BAYER DCA - WAIVED): 5.7 % — ABNORMAL HIGH (ref 4.8–5.6)

## 2024-08-30 MED ORDER — DULOXETINE HCL 60 MG PO CPEP: 60.0000 mg | ORAL_CAPSULE | Freq: Every day | ORAL | 3 refills | Status: AC

## 2024-08-30 NOTE — Progress Notes (Signed)
 Established Patient Office Visit  Subjective   Patient ID: [name] Foster, male    DOB: [birth year]  Age: 68 y.o. MRN: [id number]  Chief Complaint  Patient presents with   Medical Management of Chronic Issues    HPI  History of Present Illness   [name] Foster Foster is a 68 year old male with diabetes, atrial fibrillation, and neuropathy who presents for a follow-up visit.  Glycemic control - Blood glucose levels have been stable with Farxiga  therapy.  Renal protection - Under nephrology care for microalbuminuria - Currently taking Kerendia, farxiga , and losartan .  Atrial fibrillation - Atrial fibrillation has been asymptomatic since ablation procedure. - Continues on Eliquis  for anticoagulation. - No chest pain, shortness of breath, or palpitations.  Dyslipidemia management - Continues on statin and Zetia  for cholesterol management.  Peripheral neuropathy - Significant neuropathy affecting both feet, extending nearly to the knees. - Symptoms include numbness, tingling, and cold sensation in the feet. - One area of the foot feels as if 'something trying to dig in there sometimes.' - Symptoms are present both day and night; unable to feel toes for years. - Cymbalta  provided initial relief but symptoms persist. - Previous trial of gabapentin  without improvement. - Diagnosed with severe neuropathy at a neuropathy clinic; recommended treatment not covered by insurance.        ROS As per HPI.    Objective:     BP 136/72   Pulse 87   Temp 98.6 F (37 C) (Temporal)   Ht 5' 9 (1.753 m)   Wt 179 lb 6.4 oz (81.4 kg)   SpO2 96%   BMI 26.49 kg/m    Physical Exam Vitals and nursing note reviewed.  Constitutional:      General: He is not in acute distress.    Appearance: Normal appearance. He is not ill-appearing, toxic-appearing or diaphoretic.  Cardiovascular:     Rate and Rhythm: Normal rate and regular rhythm.     Pulses: Normal pulses.     Heart sounds:  Normal heart sounds. No murmur heard. Pulmonary:     Effort: Pulmonary effort is normal. No respiratory distress.     Breath sounds: Normal breath sounds.  Musculoskeletal:     Right lower leg: No edema.     Left lower leg: No edema.  Skin:    General: Skin is warm and dry.  Neurological:     General: No focal deficit present.     Mental Status: He is alert and oriented to person, place, and time.  Psychiatric:        Mood and Affect: Mood normal.        Behavior: Behavior normal.      No results found for any visits on 08/30/24.    The ASCVD Risk score (Arnett DK, et al., 2019) failed to calculate for the following reasons:   Risk score cannot be calculated because patient has a medical history suggesting prior/existing ASCVD   * - Cholesterol units were assumed    Assessment & Plan:   [name] Foster was seen today for medical management of chronic issues.  Diagnoses and all orders for this visit:  Type 2 diabetes mellitus with hyperglycemia, without long-term current use of insulin  (HCC) -     Bayer DCA Hb A1c Waived  Diabetic nephropathy associated with type 2 diabetes mellitus (HCC) -     DULoxetine  (CYMBALTA ) 60 MG capsule; Take 1 capsule (60 mg total) by mouth daily.  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 Nephropathy managed with Farxiga , Kerendia, losartan . Severe polyneuropathy with ineffective Gabapentin  previously and reduced Cymbalta  efficacy.   - Continue Farxiga , Kerendia, losartan . - Increase Cymbalta  to 60 mg. - Recommend Nervive supplement. - Monitor for Cymbalta  side effects and report adverse effects. - Follow up in six months, report Cymbalta  progress sooner if needed.  HTN BP at goal.   Persistent atrial fibrillation with hypercoagulable state Asymptomatic post-ablation. Long-term anticoagulation with Eliquis  planned. - Continue follow up with cardiology.     Return in about 6 months (around 02/28/2025) for chronic follow up.   The patient indicates understanding of these issues and agrees with the plan.  Annabella CHRISTELLA Search, FNP

## 2024-08-30 NOTE — Patient Instructions (Signed)
 Nervive nerve relief tablet

## 2024-08-31 ENCOUNTER — Ambulatory Visit: Payer: Self-pay | Admitting: Family Medicine

## 2024-08-31 DIAGNOSIS — E559 Vitamin D deficiency, unspecified: Secondary | ICD-10-CM

## 2024-08-31 LAB — CMP14+EGFR
ALT: 6 IU/L (ref 0–44)
AST: 16 IU/L (ref 0–40)
Albumin: 3.3 g/dL — ABNORMAL LOW (ref 3.9–4.9)
Alkaline Phosphatase: 69 IU/L (ref 47–123)
BUN/Creatinine Ratio: 15 (ref 10–24)
BUN: 24 mg/dL (ref 8–27)
Bilirubin Total: 0.2 mg/dL (ref 0.0–1.2)
CO2: 22 mmol/L (ref 20–29)
Calcium: 8.5 mg/dL — ABNORMAL LOW (ref 8.6–10.2)
Chloride: 98 mmol/L (ref 96–106)
Creatinine, Ser: 1.58 mg/dL — ABNORMAL HIGH (ref 0.76–1.27)
Globulin, Total: 3.7 g/dL (ref 1.5–4.5)
Glucose: 155 mg/dL — ABNORMAL HIGH (ref 70–99)
Potassium: 4.6 mmol/L (ref 3.5–5.2)
Sodium: 132 mmol/L — ABNORMAL LOW (ref 134–144)
Total Protein: 7 g/dL (ref 6.0–8.5)
eGFR: 47 mL/min/1.73 — ABNORMAL LOW

## 2024-08-31 LAB — CBC WITH DIFFERENTIAL/PLATELET
Basophils Absolute: 0 x10E3/uL (ref 0.0–0.2)
Basos: 1 %
EOS (ABSOLUTE): 0.1 x10E3/uL (ref 0.0–0.4)
Eos: 1 %
Hematocrit: 40.1 % (ref 37.5–51.0)
Hemoglobin: 12.7 g/dL — ABNORMAL LOW (ref 13.0–17.7)
Immature Grans (Abs): 0.1 x10E3/uL (ref 0.0–0.1)
Immature Granulocytes: 1 %
Lymphocytes Absolute: 1.1 x10E3/uL (ref 0.7–3.1)
Lymphs: 16 %
MCH: 26.9 pg (ref 26.6–33.0)
MCHC: 31.7 g/dL (ref 31.5–35.7)
MCV: 85 fL (ref 79–97)
Monocytes Absolute: 0.7 x10E3/uL (ref 0.1–0.9)
Monocytes: 11 %
Neutrophils Absolute: 4.5 x10E3/uL (ref 1.4–7.0)
Neutrophils: 70 %
Platelets: 199 x10E3/uL (ref 150–450)
RBC: 4.72 x10E6/uL (ref 4.14–5.80)
RDW: 15.1 % (ref 11.6–15.4)
WBC: 6.4 x10E3/uL (ref 3.4–10.8)

## 2024-08-31 LAB — VITAMIN D 25 HYDROXY (VIT D DEFICIENCY, FRACTURES): Vit D, 25-Hydroxy: 23.2 ng/mL — ABNORMAL LOW (ref 30.0–100.0)

## 2024-08-31 LAB — TSH: TSH: 2.25 u[IU]/mL (ref 0.450–4.500)

## 2024-08-31 MED ORDER — VITAMIN D (ERGOCALCIFEROL) 1.25 MG (50000 UNIT) PO CAPS: 50000.0000 [IU] | ORAL_CAPSULE | ORAL | 0 refills | Status: AC

## 2024-09-21 ENCOUNTER — Other Ambulatory Visit: Payer: Self-pay | Admitting: Family Medicine

## 2024-09-21 DIAGNOSIS — I4891 Unspecified atrial fibrillation: Secondary | ICD-10-CM

## 2025-02-28 ENCOUNTER — Ambulatory Visit: Admitting: Family Medicine

## 2025-05-16 ENCOUNTER — Ambulatory Visit: Payer: Self-pay
